# Patient Record
Sex: Female | Born: 1940 | Race: White | Hispanic: No | Marital: Married | Smoking: Never smoker
Health system: Southern US, Community
[De-identification: ages and names within clinical notes are randomized; demographics above are authoritative.]

## PROBLEM LIST (undated history)

## (undated) DIAGNOSIS — M0520 Rheumatoid vasculitis with rheumatoid arthritis of unspecified site: Secondary | ICD-10-CM

## (undated) DIAGNOSIS — E119 Type 2 diabetes mellitus without complications: Secondary | ICD-10-CM

## (undated) DIAGNOSIS — M1990 Unspecified osteoarthritis, unspecified site: Secondary | ICD-10-CM

## (undated) DIAGNOSIS — F32A Depression, unspecified: Secondary | ICD-10-CM

## (undated) DIAGNOSIS — M069 Rheumatoid arthritis, unspecified: Secondary | ICD-10-CM

## (undated) DIAGNOSIS — Z8489 Family history of other specified conditions: Secondary | ICD-10-CM

## (undated) DIAGNOSIS — F419 Anxiety disorder, unspecified: Secondary | ICD-10-CM

## (undated) DIAGNOSIS — Z9289 Personal history of other medical treatment: Secondary | ICD-10-CM

## (undated) DIAGNOSIS — R569 Unspecified convulsions: Secondary | ICD-10-CM

## (undated) DIAGNOSIS — E1110 Type 2 diabetes mellitus with ketoacidosis without coma: Secondary | ICD-10-CM

## (undated) DIAGNOSIS — I471 Supraventricular tachycardia, unspecified: Secondary | ICD-10-CM

## (undated) DIAGNOSIS — I10 Essential (primary) hypertension: Secondary | ICD-10-CM

## (undated) DIAGNOSIS — I313 Pericardial effusion (noninflammatory): Secondary | ICD-10-CM

## (undated) DIAGNOSIS — D649 Anemia, unspecified: Secondary | ICD-10-CM

## (undated) DIAGNOSIS — K625 Hemorrhage of anus and rectum: Secondary | ICD-10-CM

## (undated) DIAGNOSIS — R0902 Hypoxemia: Secondary | ICD-10-CM

## (undated) DIAGNOSIS — I219 Acute myocardial infarction, unspecified: Secondary | ICD-10-CM

## (undated) DIAGNOSIS — K5790 Diverticulosis of intestine, part unspecified, without perforation or abscess without bleeding: Secondary | ICD-10-CM

## (undated) DIAGNOSIS — Z794 Long term (current) use of insulin: Secondary | ICD-10-CM

## (undated) DIAGNOSIS — F329 Major depressive disorder, single episode, unspecified: Secondary | ICD-10-CM

## (undated) DIAGNOSIS — C801 Malignant (primary) neoplasm, unspecified: Secondary | ICD-10-CM

## (undated) DIAGNOSIS — I3139 Other pericardial effusion (noninflammatory): Secondary | ICD-10-CM

## (undated) DIAGNOSIS — K219 Gastro-esophageal reflux disease without esophagitis: Secondary | ICD-10-CM

## (undated) DIAGNOSIS — K222 Esophageal obstruction: Secondary | ICD-10-CM

## (undated) HISTORY — DX: Supraventricular tachycardia: I47.1

## (undated) HISTORY — DX: Unspecified osteoarthritis, unspecified site: M19.90

## (undated) HISTORY — DX: Type 2 diabetes mellitus without complications: Z79.4

## (undated) HISTORY — DX: Hypoxemia: R09.02

## (undated) HISTORY — PX: CHOLECYSTECTOMY: SHX55

## (undated) HISTORY — DX: Other pericardial effusion (noninflammatory): I31.39

## (undated) HISTORY — DX: Essential (primary) hypertension: I10

## (undated) HISTORY — DX: Anxiety disorder, unspecified: F41.9

## (undated) HISTORY — DX: Pericardial effusion (noninflammatory): I31.3

## (undated) HISTORY — DX: Supraventricular tachycardia, unspecified: I47.10

## (undated) HISTORY — DX: Gastro-esophageal reflux disease without esophagitis: K21.9

## (undated) HISTORY — DX: Anemia, unspecified: D64.9

## (undated) HISTORY — DX: Rheumatoid arthritis, unspecified: M06.9

## (undated) HISTORY — DX: Type 2 diabetes mellitus with ketoacidosis without coma: E11.10

## (undated) HISTORY — DX: Diverticulosis of intestine, part unspecified, without perforation or abscess without bleeding: K57.90

## (undated) HISTORY — DX: Depression, unspecified: F32.A

## (undated) HISTORY — DX: Esophageal obstruction: K22.2

## (undated) HISTORY — DX: Acute myocardial infarction, unspecified: I21.9

## (undated) HISTORY — DX: Major depressive disorder, single episode, unspecified: F32.9

## (undated) HISTORY — DX: Type 2 diabetes mellitus without complications: E11.9

---

## 1978-04-15 HISTORY — PX: ABDOMINAL HYSTERECTOMY: SHX81

## 1991-05-05 ENCOUNTER — Encounter: Payer: Self-pay | Admitting: Gastroenterology

## 1998-03-02 ENCOUNTER — Other Ambulatory Visit: Admission: RE | Admit: 1998-03-02 | Discharge: 1998-03-02 | Payer: Self-pay | Admitting: *Deleted

## 1999-06-02 ENCOUNTER — Emergency Department (HOSPITAL_COMMUNITY): Admission: EM | Admit: 1999-06-02 | Discharge: 1999-06-02 | Payer: Self-pay | Admitting: *Deleted

## 1999-07-15 ENCOUNTER — Emergency Department (HOSPITAL_COMMUNITY): Admission: EM | Admit: 1999-07-15 | Discharge: 1999-07-16 | Payer: Self-pay | Admitting: Internal Medicine

## 1999-07-16 ENCOUNTER — Encounter: Payer: Self-pay | Admitting: Emergency Medicine

## 2000-04-19 ENCOUNTER — Emergency Department (HOSPITAL_COMMUNITY): Admission: EM | Admit: 2000-04-19 | Discharge: 2000-04-19 | Payer: Self-pay | Admitting: Emergency Medicine

## 2000-06-21 ENCOUNTER — Emergency Department (HOSPITAL_COMMUNITY): Admission: EM | Admit: 2000-06-21 | Discharge: 2000-06-21 | Payer: Self-pay | Admitting: Emergency Medicine

## 2000-06-28 ENCOUNTER — Emergency Department (HOSPITAL_COMMUNITY): Admission: EM | Admit: 2000-06-28 | Discharge: 2000-06-28 | Payer: Self-pay | Admitting: Emergency Medicine

## 2000-09-21 ENCOUNTER — Encounter: Payer: Self-pay | Admitting: Emergency Medicine

## 2000-09-21 ENCOUNTER — Emergency Department (HOSPITAL_COMMUNITY): Admission: EM | Admit: 2000-09-21 | Discharge: 2000-09-21 | Payer: Self-pay | Admitting: Emergency Medicine

## 2001-09-24 ENCOUNTER — Encounter: Payer: Self-pay | Admitting: Emergency Medicine

## 2001-09-24 ENCOUNTER — Emergency Department (HOSPITAL_COMMUNITY): Admission: EM | Admit: 2001-09-24 | Discharge: 2001-09-24 | Payer: Self-pay | Admitting: Emergency Medicine

## 2002-03-23 ENCOUNTER — Encounter: Payer: Self-pay | Admitting: Emergency Medicine

## 2002-03-23 ENCOUNTER — Emergency Department (HOSPITAL_COMMUNITY): Admission: EM | Admit: 2002-03-23 | Discharge: 2002-03-23 | Payer: Self-pay | Admitting: Emergency Medicine

## 2002-12-06 ENCOUNTER — Emergency Department (HOSPITAL_COMMUNITY): Admission: EM | Admit: 2002-12-06 | Discharge: 2002-12-07 | Payer: Self-pay | Admitting: Emergency Medicine

## 2002-12-07 ENCOUNTER — Encounter: Payer: Self-pay | Admitting: Hematology and Oncology

## 2002-12-07 ENCOUNTER — Inpatient Hospital Stay (HOSPITAL_COMMUNITY): Admission: EM | Admit: 2002-12-07 | Discharge: 2002-12-08 | Payer: Self-pay | Admitting: Emergency Medicine

## 2002-12-15 DIAGNOSIS — F419 Anxiety disorder, unspecified: Secondary | ICD-10-CM

## 2002-12-15 HISTORY — DX: Anxiety disorder, unspecified: F41.9

## 2002-12-29 ENCOUNTER — Encounter: Payer: Self-pay | Admitting: Emergency Medicine

## 2002-12-29 ENCOUNTER — Inpatient Hospital Stay (HOSPITAL_COMMUNITY): Admission: EM | Admit: 2002-12-29 | Discharge: 2003-01-04 | Payer: Self-pay | Admitting: Emergency Medicine

## 2002-12-30 ENCOUNTER — Encounter: Payer: Self-pay | Admitting: Hematology and Oncology

## 2002-12-30 ENCOUNTER — Encounter: Payer: Self-pay | Admitting: Cardiology

## 2003-01-03 ENCOUNTER — Encounter: Payer: Self-pay | Admitting: *Deleted

## 2003-09-29 ENCOUNTER — Ambulatory Visit (HOSPITAL_COMMUNITY): Admission: RE | Admit: 2003-09-29 | Discharge: 2003-09-29 | Payer: Self-pay | Admitting: *Deleted

## 2003-09-29 ENCOUNTER — Encounter (INDEPENDENT_AMBULATORY_CARE_PROVIDER_SITE_OTHER): Payer: Self-pay | Admitting: *Deleted

## 2003-11-21 ENCOUNTER — Encounter: Admission: RE | Admit: 2003-11-21 | Discharge: 2003-11-21 | Payer: Self-pay | Admitting: Internal Medicine

## 2003-12-01 ENCOUNTER — Encounter: Admission: RE | Admit: 2003-12-01 | Discharge: 2003-12-01 | Payer: Self-pay | Admitting: Neurosurgery

## 2003-12-16 ENCOUNTER — Encounter: Admission: RE | Admit: 2003-12-16 | Discharge: 2003-12-16 | Payer: Self-pay | Admitting: Neurosurgery

## 2004-03-30 ENCOUNTER — Encounter: Admission: RE | Admit: 2004-03-30 | Discharge: 2004-03-30 | Payer: Self-pay | Admitting: *Deleted

## 2004-05-22 ENCOUNTER — Encounter: Admission: RE | Admit: 2004-05-22 | Discharge: 2004-05-22 | Payer: Self-pay | Admitting: *Deleted

## 2004-06-13 DIAGNOSIS — K222 Esophageal obstruction: Secondary | ICD-10-CM

## 2004-06-13 HISTORY — DX: Esophageal obstruction: K22.2

## 2004-06-18 ENCOUNTER — Ambulatory Visit (HOSPITAL_COMMUNITY): Admission: RE | Admit: 2004-06-18 | Discharge: 2004-06-18 | Payer: Self-pay | Admitting: *Deleted

## 2004-09-16 ENCOUNTER — Ambulatory Visit: Payer: Self-pay | Admitting: Cardiovascular Disease

## 2004-09-16 ENCOUNTER — Inpatient Hospital Stay (HOSPITAL_COMMUNITY): Admission: EM | Admit: 2004-09-16 | Discharge: 2004-09-18 | Payer: Self-pay | Admitting: Emergency Medicine

## 2005-10-12 ENCOUNTER — Encounter: Admission: RE | Admit: 2005-10-12 | Discharge: 2005-10-12 | Payer: Self-pay | Admitting: Internal Medicine

## 2006-11-12 ENCOUNTER — Ambulatory Visit: Payer: Self-pay | Admitting: Pulmonary Disease

## 2006-11-21 ENCOUNTER — Ambulatory Visit: Payer: Self-pay | Admitting: Pulmonary Disease

## 2006-12-02 ENCOUNTER — Ambulatory Visit: Payer: Self-pay | Admitting: Pulmonary Disease

## 2007-02-02 ENCOUNTER — Emergency Department (HOSPITAL_COMMUNITY): Admission: EM | Admit: 2007-02-02 | Discharge: 2007-02-03 | Payer: Self-pay | Admitting: Emergency Medicine

## 2007-02-11 ENCOUNTER — Ambulatory Visit: Payer: Self-pay | Admitting: Gastroenterology

## 2007-02-18 ENCOUNTER — Encounter: Payer: Self-pay | Admitting: Gastroenterology

## 2007-02-18 ENCOUNTER — Ambulatory Visit: Payer: Self-pay | Admitting: Gastroenterology

## 2007-06-08 DIAGNOSIS — J40 Bronchitis, not specified as acute or chronic: Secondary | ICD-10-CM | POA: Insufficient documentation

## 2007-06-08 DIAGNOSIS — C44309 Unspecified malignant neoplasm of skin of other parts of face: Secondary | ICD-10-CM | POA: Insufficient documentation

## 2007-06-08 DIAGNOSIS — K222 Esophageal obstruction: Secondary | ICD-10-CM

## 2007-06-08 DIAGNOSIS — C44300 Unspecified malignant neoplasm of skin of unspecified part of face: Secondary | ICD-10-CM | POA: Insufficient documentation

## 2007-06-08 DIAGNOSIS — J45909 Unspecified asthma, uncomplicated: Secondary | ICD-10-CM | POA: Insufficient documentation

## 2007-06-08 DIAGNOSIS — K648 Other hemorrhoids: Secondary | ICD-10-CM | POA: Insufficient documentation

## 2007-06-08 DIAGNOSIS — K2990 Gastroduodenitis, unspecified, without bleeding: Secondary | ICD-10-CM

## 2007-06-08 DIAGNOSIS — E876 Hypokalemia: Secondary | ICD-10-CM | POA: Insufficient documentation

## 2007-06-08 DIAGNOSIS — K589 Irritable bowel syndrome without diarrhea: Secondary | ICD-10-CM

## 2007-06-08 DIAGNOSIS — D126 Benign neoplasm of colon, unspecified: Secondary | ICD-10-CM

## 2007-06-08 DIAGNOSIS — K219 Gastro-esophageal reflux disease without esophagitis: Secondary | ICD-10-CM | POA: Insufficient documentation

## 2007-06-08 DIAGNOSIS — K5909 Other constipation: Secondary | ICD-10-CM

## 2007-06-08 DIAGNOSIS — R51 Headache: Secondary | ICD-10-CM

## 2007-06-08 DIAGNOSIS — J449 Chronic obstructive pulmonary disease, unspecified: Secondary | ICD-10-CM | POA: Insufficient documentation

## 2007-06-08 DIAGNOSIS — R519 Headache, unspecified: Secondary | ICD-10-CM | POA: Insufficient documentation

## 2007-06-08 DIAGNOSIS — I499 Cardiac arrhythmia, unspecified: Secondary | ICD-10-CM | POA: Insufficient documentation

## 2007-06-08 DIAGNOSIS — J309 Allergic rhinitis, unspecified: Secondary | ICD-10-CM | POA: Insufficient documentation

## 2007-06-08 DIAGNOSIS — M069 Rheumatoid arthritis, unspecified: Secondary | ICD-10-CM | POA: Insufficient documentation

## 2007-06-08 DIAGNOSIS — F411 Generalized anxiety disorder: Secondary | ICD-10-CM | POA: Insufficient documentation

## 2007-06-08 DIAGNOSIS — D649 Anemia, unspecified: Secondary | ICD-10-CM

## 2007-06-08 DIAGNOSIS — G4730 Sleep apnea, unspecified: Secondary | ICD-10-CM | POA: Insufficient documentation

## 2007-06-08 DIAGNOSIS — I309 Acute pericarditis, unspecified: Secondary | ICD-10-CM

## 2007-06-08 DIAGNOSIS — K2970 Gastritis, unspecified, without bleeding: Secondary | ICD-10-CM | POA: Insufficient documentation

## 2007-06-08 DIAGNOSIS — I10 Essential (primary) hypertension: Secondary | ICD-10-CM | POA: Insufficient documentation

## 2008-11-13 DIAGNOSIS — R569 Unspecified convulsions: Secondary | ICD-10-CM

## 2008-11-13 HISTORY — DX: Unspecified convulsions: R56.9

## 2008-12-04 ENCOUNTER — Inpatient Hospital Stay (HOSPITAL_COMMUNITY): Admission: EM | Admit: 2008-12-04 | Discharge: 2008-12-09 | Payer: Self-pay | Admitting: Emergency Medicine

## 2008-12-04 ENCOUNTER — Ambulatory Visit: Payer: Self-pay | Admitting: Cardiovascular Disease

## 2008-12-06 ENCOUNTER — Encounter (INDEPENDENT_AMBULATORY_CARE_PROVIDER_SITE_OTHER): Payer: Self-pay | Admitting: Internal Medicine

## 2009-04-03 ENCOUNTER — Encounter: Admission: RE | Admit: 2009-04-03 | Discharge: 2009-04-12 | Payer: Self-pay | Admitting: Internal Medicine

## 2010-07-21 LAB — HEPATIC FUNCTION PANEL
ALT: 19 U/L (ref 0–35)
AST: 21 U/L (ref 0–37)
Bilirubin, Direct: 0.1 mg/dL (ref 0.0–0.3)
Indirect Bilirubin: 0.9 mg/dL (ref 0.3–0.9)
Total Bilirubin: 1 mg/dL (ref 0.3–1.2)

## 2010-07-21 LAB — BASIC METABOLIC PANEL
BUN: 20 mg/dL (ref 6–23)
BUN: 4 mg/dL — ABNORMAL LOW (ref 6–23)
BUN: 5 mg/dL — ABNORMAL LOW (ref 6–23)
CO2: 28 mEq/L (ref 19–32)
CO2: 28 mEq/L (ref 19–32)
CO2: 30 mEq/L (ref 19–32)
Calcium: 8.3 mg/dL — ABNORMAL LOW (ref 8.4–10.5)
Chloride: 100 mEq/L (ref 96–112)
Chloride: 103 mEq/L (ref 96–112)
Chloride: 88 mEq/L — ABNORMAL LOW (ref 96–112)
Creatinine, Ser: 1 mg/dL (ref 0.4–1.2)
Creatinine, Ser: 1.03 mg/dL (ref 0.4–1.2)
Creatinine, Ser: 1.1 mg/dL (ref 0.4–1.2)
Creatinine, Ser: 1.56 mg/dL — ABNORMAL HIGH (ref 0.4–1.2)
GFR calc Af Amer: >60 mL/min (ref >60)
GFR calc Af Amer: >60 mL/min (ref >60)
GFR calc non Af Amer: 33 mL/min — ABNORMAL LOW (ref >60)
GFR calc non Af Amer: 50 mL/min — ABNORMAL LOW (ref >60)
GFR calc non Af Amer: 54 mL/min — ABNORMAL LOW (ref >60)
Glucose, Bld: 102 mg/dL — ABNORMAL HIGH (ref 70–99)
Glucose, Bld: 53 mg/dL — ABNORMAL LOW (ref 70–99)
Glucose, Bld: 55 mg/dL — ABNORMAL LOW (ref 70–99)
Glucose, Bld: 647 mg/dL (ref 70–99)
Glucose, Bld: 70 mg/dL (ref 70–99)
Potassium: 3.5 mEq/L (ref 3.5–5.1)
Potassium: 3.7 mEq/L (ref 3.5–5.1)
Potassium: 4 mEq/L (ref 3.5–5.1)
Sodium: 126 mEq/L — ABNORMAL LOW (ref 135–145)
Sodium: 130 mEq/L — ABNORMAL LOW (ref 135–145)

## 2010-07-21 LAB — URINALYSIS, ROUTINE W REFLEX MICROSCOPIC
Leukocytes, UA: NEGATIVE
Protein, ur: NEGATIVE mg/dL
Urobilinogen, UA: 0.2 mg/dL (ref 0.0–1.0)

## 2010-07-21 LAB — GLUCOSE, CAPILLARY
Glucose-Capillary: 106 mg/dL — ABNORMAL HIGH (ref 70–99)
Glucose-Capillary: 122 mg/dL — ABNORMAL HIGH (ref 70–99)
Glucose-Capillary: 134 mg/dL — ABNORMAL HIGH (ref 70–99)
Glucose-Capillary: 137 mg/dL — ABNORMAL HIGH (ref 70–99)
Glucose-Capillary: 155 mg/dL — ABNORMAL HIGH (ref 70–99)
Glucose-Capillary: 155 mg/dL — ABNORMAL HIGH (ref 70–99)
Glucose-Capillary: 164 mg/dL — ABNORMAL HIGH (ref 70–99)
Glucose-Capillary: 198 mg/dL — ABNORMAL HIGH (ref 70–99)
Glucose-Capillary: 272 mg/dL — ABNORMAL HIGH (ref 70–99)
Glucose-Capillary: 395 mg/dL — ABNORMAL HIGH (ref 70–99)
Glucose-Capillary: 529 mg/dL (ref 70–99)
Glucose-Capillary: 53 mg/dL — ABNORMAL LOW (ref 70–99)
Glucose-Capillary: 58 mg/dL — ABNORMAL LOW (ref 70–99)
Glucose-Capillary: 60 mg/dL — ABNORMAL LOW (ref 70–99)
Glucose-Capillary: 65 mg/dL — ABNORMAL LOW (ref 70–99)
Glucose-Capillary: 66 mg/dL — ABNORMAL LOW (ref 70–99)
Glucose-Capillary: 74 mg/dL (ref 70–99)
Glucose-Capillary: 80 mg/dL (ref 70–99)
Glucose-Capillary: 90 mg/dL (ref 70–99)
Glucose-Capillary: 92 mg/dL (ref 70–99)
Glucose-Capillary: >600 mg/dL (ref 70–99)

## 2010-07-21 LAB — DIFFERENTIAL
Basophils Absolute: 0 10*3/uL (ref 0.0–0.1)
Basophils Absolute: 0.1 10*3/uL (ref 0.0–0.1)
Basophils Relative: 0 % (ref 0–1)
Basophils Relative: 1 % (ref 0–1)
Eosinophils Relative: 0 % (ref 0–5)
Eosinophils Relative: 1 % (ref 0–5)
Lymphocytes Relative: 6 % — ABNORMAL LOW (ref 12–46)
Lymphs Abs: 0.4 10*3/uL — ABNORMAL LOW (ref 0.7–4.0)
Monocytes Absolute: 0.7 10*3/uL (ref 0.1–1.0)
Monocytes Absolute: 1 10*3/uL (ref 0.1–1.0)
Monocytes Relative: 10 % (ref 3–12)
Monocytes Relative: 7 % (ref 3–12)

## 2010-07-21 LAB — CARDIAC PANEL(CRET KIN+CKTOT+MB+TROPI)
CK, MB: 5.2 ng/mL — ABNORMAL HIGH (ref 0.3–4.0)
CK, MB: 5.3 ng/mL — ABNORMAL HIGH (ref 0.3–4.0)
Relative Index: 3.8 — ABNORMAL HIGH (ref 0.0–2.5)
Total CK: 125 U/L (ref 7–177)
Total CK: 137 U/L (ref 7–177)
Troponin I: 0.08 ng/mL — ABNORMAL HIGH (ref 0.00–0.06)

## 2010-07-21 LAB — COMPREHENSIVE METABOLIC PANEL
Albumin: 2.9 g/dL — ABNORMAL LOW (ref 3.5–5.2)
BUN: 12 mg/dL (ref 6–23)
Calcium: 8.1 mg/dL — ABNORMAL LOW (ref 8.4–10.5)
Chloride: 103 mEq/L (ref 96–112)
Creatinine, Ser: 1.1 mg/dL (ref 0.4–1.2)
Total Bilirubin: 0.9 mg/dL (ref 0.3–1.2)

## 2010-07-21 LAB — POCT CARDIAC MARKERS
CKMB, poc: 1.3 ng/mL (ref 1.0–8.0)
Myoglobin, poc: 198 ng/mL (ref 12–200)
Troponin i, poc: <0.05 ng/mL (ref 0.00–0.09)

## 2010-07-21 LAB — CBC
HCT: 28.4 % — ABNORMAL LOW (ref 36.0–46.0)
HCT: 28.9 % — ABNORMAL LOW (ref 36.0–46.0)
HCT: 30.8 % — ABNORMAL LOW (ref 36.0–46.0)
HCT: 31.2 % — ABNORMAL LOW (ref 36.0–46.0)
Hemoglobin: 10.8 g/dL — ABNORMAL LOW (ref 12.0–15.0)
Hemoglobin: 12.4 g/dL (ref 12.0–15.0)
Hemoglobin: 9.9 g/dL — ABNORMAL LOW (ref 12.0–15.0)
MCHC: 34.3 g/dL (ref 30.0–36.0)
MCHC: 34.6 g/dL (ref 30.0–36.0)
MCHC: 34.7 g/dL (ref 30.0–36.0)
MCV: 91.6 fL (ref 78.0–100.0)
MCV: 92.9 fL (ref 78.0–100.0)
Platelets: 237 10*3/uL (ref 150–400)
Platelets: 254 10*3/uL (ref 150–400)
RBC: 3.12 MIL/uL — ABNORMAL LOW (ref 3.87–5.11)
RBC: 3.39 MIL/uL — ABNORMAL LOW (ref 3.87–5.11)
RBC: 3.88 MIL/uL (ref 3.87–5.11)
RDW: 13.7 % (ref 11.5–15.5)
RDW: 13.7 % (ref 11.5–15.5)
RDW: 13.9 % (ref 11.5–15.5)
RDW: 14.1 % (ref 11.5–15.5)
WBC: 10.3 10*3/uL (ref 4.0–10.5)
WBC: 10.5 10*3/uL (ref 4.0–10.5)
WBC: 11.3 10*3/uL — ABNORMAL HIGH (ref 4.0–10.5)

## 2010-07-21 LAB — HEMOGLOBIN A1C: Hgb A1c MFr Bld: 8.5 % — ABNORMAL HIGH (ref 4.6–6.1)

## 2010-07-21 LAB — LIPID PANEL
Cholesterol: 299 mg/dL — ABNORMAL HIGH (ref 0–200)
HDL: 53 mg/dL (ref >39)
LDL Cholesterol: UNDETERMINED mg/dL (ref 0–99)
Total CHOL/HDL Ratio: 5.6 RATIO

## 2010-07-21 LAB — RETICULOCYTES
RBC.: 3.74 MIL/uL — ABNORMAL LOW (ref 3.87–5.11)
Retic Count, Absolute: 59.8 10*3/uL (ref 19.0–186.0)

## 2010-07-21 LAB — VITAMIN B12: Vitamin B-12: 502 pg/mL (ref 211–911)

## 2010-07-21 LAB — IRON AND TIBC: Iron: 49 ug/dL (ref 42–135)

## 2010-07-21 LAB — URINE CULTURE: Colony Count: 80000

## 2010-07-21 LAB — CK TOTAL AND CKMB (NOT AT ARMC): Total CK: 127 U/L (ref 7–177)

## 2010-07-21 LAB — URINE MICROSCOPIC-ADD ON

## 2010-07-21 LAB — MAGNESIUM: Magnesium: 1.6 mg/dL (ref 1.5–2.5)

## 2010-07-21 LAB — FERRITIN: Ferritin: 66 ng/mL (ref 10–291)

## 2010-07-21 LAB — T4, FREE: Free T4: 1.05 ng/dL (ref 0.80–1.80)

## 2010-08-28 NOTE — Consult Note (Signed)
NAMEMARDA, [name]              ACCOUNT NO.:  [id number]   MEDICAL RECORD NO.:  [id number]          PATIENT TYPE:  INP   LOCATION:  1224                         FACILITY:  [facility]   PHYSICIAN:  Levert Feinstein, MD          DATE OF BIRTH:  [birth year]   DATE OF CONSULTATION:  12/06/2008  DATE OF DISCHARGE:                                 CONSULTATION   REFERRING PHYSICIAN:  [facility], Dr. Sherrie Mustache, for seizure.   HISTORY OF PRESENT ILLNESS:  The patient is a 70 year old right-handed  Caucasian female who was admitted to hospital service on August 22, 2  days prior to today's event, for newly diagnosed diabetes.  Husband is  at the bedside who provided majority of the history.  Also from  reviewing the chart.   She has past medical history of rheumatoid arthritis, hypertension, and  has been taking low-dose long-term prednisone and also Arava.  The past  3 months has been started on Lyrica for generalized body pain.  In the  past few weeks, she has been complaining of blurred vision, and  polyuria.  Eventually visited her ophthalmologist to get her eyes  checked out.  Was found to have glucose level of 650, blood pressure was  230/110.  She was sent to the emergency room leading to this admission.  Today, she complains of generalized fatigue, not feeling well.  Around  3:00, she received echocardiogram.  During the procedure, she complained  of chest pain.  Later complained of pain all over.  Was also noticed to  have blood pressure 220/110 with slight tachycardia, heart rate of 110.  At 3:30, she was noticed to have seizure activity, eyes closed, body  leaned backwards, stiff, tonic-clonic movement, lasted about 1 to 2  minutes.  When it was over, she was postictal, opened eyes to verbal and  painful stimuli, but nonverbal, sluggish.  Later, she vomited.  She  moved her 4 extremities without difficulty and with conjugate eye  movements.   REVIEW OF SYSTEMS:  As above.   PAST  MEDICAL HISTORY:  Asthma, hypertension, acid reflux, anxiety,  hiatal hernia, irritable bowel syndrome, rheumatoid arthritis,  hypertension, newly diagnosed diabetes.   PAST SURGICAL HISTORY:  Hysterectomy in 1981.  Status post  cholecystectomy in 1991.  Endoscopy and colonoscopy in June 2005.  Cardiac catheterization June 2006.  Was reported clean catheterization.   SOCIAL HISTORY:  Lives with her husband.  Denies smoking or drinking.   FAMILY HISTORY:  Significant for mother had diabetes, coronary artery  disease in her father.   CURRENT MEDICATIONS:  1. Vitamin C.  2. Calcium.  3. Estrogen.  4. Lovenox.  5. Flovent.  6. Amaryl.  7. NovoLog.  8. Xopenex.  9. Lisinopril.  10.Lorazepam.  11.Magnesium.  12.Metformin.  13.Protonix.  14.Prednisone 10 mg daily.  15.Simethicone.  16.Tylenol.  17.Lopressor.  18.Zofran.   PHYSICAL EXAMINATION:  VITAL SIGNS:  She is afebrile.  Blood pressure  right now is 168/90 and heart rate is 104.  CARDIAC:  Tachycardic and regular.  NEUROLOGIC:  She opens eyes to painful  and verbal stimulation, conjugate  eye movements, but nonverbal.  Not following commands.  Had some  spontaneous movements in all 4 limbs.  Fairly symmetric.  Cranial nerves  2-12 are intact, pupils dilated 5 to 3 mm to light stimulation, equal  conjugate eye movements.  Face is symmetric.  Motor examination,  withdrawal to pain on 4 extremities.  Deep tendon reflexes hypoactive  and symmetric.  NECK:  Supple.  No bruits.   LABORATORY EVALUATION:  Sodium 130, creatinine 1.0, magnesium low.  She  was receiving magnesium sulfate supplement when the accident happened.  Glucose was reported to be 150s.  Hemoglobin 10.8.  Chronic anemia.  TSH  was normal.  A CT of the brain without contrast August 21 and today's  review.  There was fairly extensive periventricular white matter disease  but no acute lesions.   ASSESSMENT AND PLAN:  A 70 year old female with a history of   hypertension poorly controlled, newly diagnosed diabetes, had 1 seizure  generalized tonic-clonic in the setting of electrolyte imbalance,  elevated blood pressure (220/110), differential diagnosis including PRES  syndrome versus stroke.  Plan:  EEG.  MRI of the brain with and without  contrast.  Treat her blood pressure, hyperglycemia, and hold off  antiepileptic medication now.      Levert Feinstein, MD  Electronically Signed     YY/MEDQ  D:  12/06/2008  T:  12/06/2008  Job:  [id number]

## 2010-08-28 NOTE — Procedures (Signed)
EEG NUMBER:  [id number].   REQUESTING PHYSICIAN:  Elliot Cousin, MD   CLINICAL HISTORY:  A 70 year old woman admitted August 22, who reported  in the hospital with seizure of August 24 in the setting of uncontrolled  diabetes.  EEG is for evaluation.  The patient describes awake and  alert.  This is an portable EEG done without photic stimulation or  hyperventilation.   DESCRIPTION:  The dominant rhythm tracing is a moderate to high  amplitude alpha rhythm of 9-10 Hz which predominates posteriorly,  appears without abnormal asymmetry, and attenuates with eye opening and  closing.  Low amplitude fast activity is seen frontally and centrally  and appears without abnormal asymmetry.  No focal slowing is noted and  no epileptiform discharges were seen.  The patient remained in the awake  state throughout the recording.  Photic stimulation and hyperventilation  not performed.  Single channel devoted to EKG revealed sinus tachycardia  throughout with rate of approximately 114 beats per minute.   CONCLUSIONS:  Normal study in the awake state.  Incidental note is made  of sinus tachycardia.      Michael L. Thad Ranger, M.D.  Electronically Signed     EAV:WUJW  D:  12/08/2008 21:35:44  T:  12/09/2008 05:38:49  Job #:  [id number]

## 2010-08-28 NOTE — Discharge Summary (Signed)
Marisa [name], Marisa [name]              ACCOUNT NO.:  [id number]   MEDICAL RECORD NO.:  [id number]           PATIENT TYPE:  INP   LOCATION:                               FACILITY:  [facility]   PHYSICIAN:  Beckey Rutter, MD  DATE OF BIRTH:  [birth year]   DATE OF ADMISSION:  12/04/2008  DATE OF DISCHARGE:  12/09/2008                               DISCHARGE SUMMARY   PRIMARY CARE PHYSICIAN:  Dr. Juline Patch.   CHIEF COMPLAINT:  Newly discovered diabetes.   BRIEF HISTORY OF PRESENT ILLNESS:  This is a 70 year old very pleasant  Caucasian lady with history of rheumatoid arthritis, hypertension who  was on low dose prednisone chronically.  The patient was admitted for  newly diagnosed type 2 diabetes and hypertensive urgency.   HOSPITAL PROCEDURES:  1. Chest x-ray on December 03, 2008 impression:  No acute finding.  2. CT head without contrast on December 03, 2008 impression:  No      evidence of acute infarction, mass lesion or hemorrhage on the CT.      Mild periventricular and subcortical white matter change suggests      small vessel ischemic microangiopathy.  3. Chest x-ray on December 06, 2008 impression:  No acute disease.  4. CT head without contrast on December 06, 2008 impression:  Chronic      microvascular ischemia in the white matter.  No acute abnormality.  5. MRI brain with and without contrast December 07, 2008.  The patient      has no acute intracranial abnormality.  6. Mild to moderate for age nonspecific cerebral white matter signal      change.  Favor chronic small vessel ischemia.  7. Ultrasound abdomen December 07, 2008 impression:  Cholecystectomy.      No biliary ductal dilatation.  Poor visualization of pancreatic      tail due to bowel gas.  Minimal left renal cyst appears to be      present.  Calcified splenic granulomata.   Her magnesium today is 1.7.  Sodium 139, potassium 4.1, chloride 103,  bicarb 30, glucose 102, BUN 5, creatinine is 1.0.   HOSPITAL COURSE BY  PROBLEM:  1. New diagnosis of diabetes type 2.  The patient will be discharged      with medication as below, mainly the Amaryl and the Lantus.  The      patient was seen by the registered dietician, and she watched all      the diabetes educational videos.  The patient advised to follow up      with Dr. Ricki Miller for further diabetes management adjustment.  2. Tonic-clonic  fits/seizure.  The patient was noticed to have 1      generalized tonic-clonic in the setting of electrolyte imbalance      and elevated blood pressure to 120/110 . The patient was seen by      neurologist for consultation, and the differential diagnosis at      that time of consultation was PRAS syndrome versus stroke.  The      patient had MRI done which is essentially  negative, and EEG is      showing no epileptiform waves.  The patient will not receive anti-      seizure medication at this time.  3. Acute renal failure resolved with hydration.  4. Hypertension.  The patient will be discharged on lisinopril and      metoprolol.   DISCHARGE DIAGNOSES:  1. Newly diagnosed type 2 diabetes.  2. One episode of tonic-clonic seizure.  3. Bronchial asthma.  4. Hypertension.  5. Gastroesophageal reflux disease.  6. Anxiety.  7. Hiatal hernia.  8. Irritable bowel syndrome.  9. Fibromyalgia.  10.Rheumatoid arthritis.   Please notice: During the hospital stay the patient was receiving  different doses of Ativan other than her usual/home dose which could  potentially induce the tonic-clonic fits.   DISCHARGEMEDICATION  1. Potassium chloride 20 mEq daily.  2. Prednisone 5 mg daily.  3. Arava 20 mg daily.  4. Premarin 0.6 mg daily.  5. Nexium 40 mg twice a day.  6. Toprol XL 100 mg daily.  7. Lorazepam 2 mg at bedtime.  8. Boniva 150 mg monthly.  9. 11.  Vitamin E 4000 international units daily.  10.Vitamin C 500 mg daily.  11.Iron 325 mg daily.  12.Multivitamin with iron daily.  13.Folic acid 1 mg daily.   14.Os-Cal with vitamin D 2 tablets daily.  15.Vitamin D 1.25 mg daily.  16.Lisinopril 10 mg daily.  17.Ativan 1 mg p.o. daily and 2 mg q.h.s.  18.Amaryl 2 mg p.o. daily.  19.Metformin 500 mg p.o. b.i.d..  20.Prescription for glucometer, alcohol swabs, lancets, and strips as      written.   DISCHARGE PLAN:  The patient will be discharged today to follow up with  Dr. Ricki Miller within next week.  The patient is aware and agreeable to the  discharge and follow-up plans.      Beckey Rutter, MD  Electronically Signed    EME/MEDQ  D:  12/09/2008  T:  12/09/2008  Job:  [id number]   cc:   Juline Patch, M.D.  Fax: [phone]

## 2010-08-28 NOTE — H&P (Signed)
Marisa [name], Marisa [name] NO.:  [id number]   MEDICAL RECORD NO.:  [id number]          PATIENT TYPE:  INP   LOCATION:  1422                         FACILITY:  [facility]   PHYSICIAN:  Vania Rea, M.D. DATE OF BIRTH:  May 23, 1940   DATE OF ADMISSION:  12/03/2008  DATE OF DISCHARGE:                              HISTORY & PHYSICAL   PRIMARY CARE PHYSICIAN:  Dr. Juline Patch.   CHIEF COMPLAINT:  Newly-discovered diabetes.   HISTORY OF PRESENT ILLNESS:  This is a 70 year old Caucasian lady, with  a history of rheumatoid arthritis and hypertension, who takes  chronically low-dose prednisone and also Arava, and has been on these  for many years.  For the past three months, has been started on Lyrica  with generalized body pains, presumed fibromyalgia, but for the past few  weeks has been having blurring of vision, frequency and polyuria, and  eventually visited her ophthalmologist to get her eyes checked out.  She  had her blood sugar checked, and it was 650, and her blood pressure was  also elevated to 230/110, and she was sent to the emergency room for  evaluation.   In the emergency room, after preliminary investigations and treatment,  the hospitalists' service was called to admit this lady for new onset of  diabetes.   The patient denies any chest pains or shortness of breath, other than  occasioned by her asthma.  She denies any fever or cough.  She has been  having itching under her breasts and she has been having generalized  body aches.  She has been having no increasing weight loss.  Her mother  did have diabetes and died of complications related to diabetes.   PAST MEDICAL HISTORY:  1. Asthma.  2. Hypertension.  3. Gastroesophageal reflux disease.  4. Anxiety.  5. A hiatal hernia.  6. Irritable bowel syndrome.  7. Possible fibromyalgia.  8. rheumatoid arthritis.   MEDICATIONS:  1. includes potassium chloride 20 mEq twice daily.  2. Prednisone 5 mg,  two  tablets daily.  3. Arava 20 mg daily.  4. Premarin 0.6 mg daily.  5. Nexium 40 mg twice daily.  6. Toprol XL 100 mg daily.  7. Lorazepam 1 mg at bedtime.  8. Boniva 150 mg monthly.  9. Tandem plus, one capsule daily.  10.Xopenex by inhaler and nebulized, one to four puffs daily.  11.The patient has been out of Lyrica for about two weeks.  12.Vitamin E 4000 international units daily.  13.Vitamin C 500 mg daily.  14.Iron 65 mg daily.  15.Multivitamins with iron daily.  16.Folic acid 1 mg daily.  17.Os-Cal with D, two tablets daily.  18.Vitamin D, 1.25 mg daily.   ALLERGIES:  PENICILLIN AND DEMEROL.   PAST SURGICAL HISTORY:  1. Includes a hysterectomy in 1981.  2. Status post cholecystectomy in 1991.  3. Endoscopy and colonoscopy in June 2005.  4. Cardiac catheterization in June, 2006, and was reported apparently      as a clean catheterization.   SOCIAL HISTORY:  She denies tobacco, alcohol or illicit drug use.  She  is married  and lives at home with her spouse.   FAMILY HISTORY:  Significant for diabetes in her mother, coronary artery  disease in her father.   REVIEW OF SYSTEMS:  On a 10-point review of systems, other than as noted  above, significant only for impaired mobility.  She walks with a rolling  walker chair.   PHYSICAL EXAMINATION:  GENERAL:  A very pleasant middle-aged Caucasian  lady lying flat on the stretcher, in no acute distress.  VITAL SIGNS:  Her temperature is 98.7, her pulse is 126, her blood  pressure after intravenous labetalol is 162/91, respiratory rate is 18.  She is saturating at 97% on 2 liters.  HEENT:  The pupils are round, equal and reactive.  Mucous membranes are  pink.  She is mildly dehydrated.  NECK:  No cervical lymphadenopathy or thyromegaly.  No jugular venous  distention.  CHEST:  Clear to auscultation bilaterally.  CARDIOVASCULAR:  She is tachycardiac.  ABDOMEN:  Her abdomen is obese, soft, and no masses felt.  EXTREMITIES:   She has a trace edema bilaterally.  Dorsalis pedis pulses  2+ bilaterally.  MUSCULOSKELETAL:  She has arthritic joint deformities of both hands and  deformities of knees and crepitus of the knees and elbows.  She has  valgus deformity of the knees.  SKIN:  Her skin is warm and dry.  There is no ulceration.  She has no  intertriginous Candida of the pannus, but she has a very fine miliary  rash under the left breast.  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  She has what appears to be a  hemorrhagic rash on both arms, but she says these are birth marks.  CENTRAL NERVOUS SYSTEM:  Cranial nerves II-XII are grossly intact, and  she has no focal lateralizing signs.   LABORATORY DATA:  White count is 10.3, hemoglobin 12.4, MCV 91.8,  platelets 295.  She has 89% neutrophils, absolute leukocyte count  elevated at 9.3.  Her sodium is 126, potassium 4.0, chloride 88, CO2 of  25, glucose 647, BUN 20, creatinine 1.56, calcium 8.9.  Her cardiac  enzymes:  Myoglobin equals 198, CK-MB 1.3, troponins undetectable.   A chest x-ray one-view shows no acute findings, mild hyperinflation of  the lungs.  No acute bony abnormality.   ASSESSMENT:  1. Diabetes, type 2, newly-diagnosed.  2. Hypertensive urgency, in a lady with a history of hypertension.  3. Dehydration and acute renal failure, in the setting of uncontrolled      diabetes.  4. Pseudo-hyponatremia, in the setting of severe hyperglycemia.  5. Leukocytosis, in the setting of a severe dehydration  6. Rheumatoid arthritis.  7. Chronic pains.  8. History of gastroesophageal reflux disease.  9. History of irritable bowel syndrome.   PLAN:  Will admit this lady to the telemetry unit and continue  Glucommander, and then start her on scheduled insulin which she will  probably need for a few months at least, although the diabetes may have  been precipitated by the prednisone and Arava, and possibly also Lyrica.  We  will continue her prednisone and Arava for the time being for  rheumatoid arthritis.  In fact, will continue her home medications, and  once she is hydrated and her renal function has improved, will treat her  diabetes with an ACE inhibitor or ARB and temporarily hold her  potassium.  She will get diabetic education, other plans will be as per  orders.  Vania Rea, M.D.  Electronically Signed     LC/MEDQ  D:  12/04/2008  T:  12/04/2008  Job:  [id number]   cc:   Juline Patch, M.D.  Fax: [phone]   Gwyneth Sprout, MD

## 2010-08-28 NOTE — Assessment & Plan Note (Signed)
[facility]                         GASTROENTEROLOGY OFFICE NOTE   Marisa [name], Marisa [name]                     MRN:          [id number]  DATE:02/11/2007                            DOB:          03-May-1940    REFERRING PHYSICIAN:  Juline Patch, M.D.   REASON FOR CONSULTATION:  Right upper quadrant pain, constipation and  dysphagia.   HISTORY OF PRESENT ILLNESS:  Marisa [name] is a 70 year old white  female that I saw in the past. She underwent cholecystectomy in February  of 1991. She returned with abdominal pain, nausea, vomiting and GERD in  January of 1993. Upper endoscopy revealed mild antral gastritis and a  small hiatal hernia. An ERCP showed a normal post cholecystectomy  cholangiogram and a normal ampulla. She has had ongoing problems with  GERD and constipation over the years. She changed gastroenterologists to  Dr. Virginia Rochester and underwent an upper endoscopy with dilatation in March of  2006 for dysphagia. A distal esophageal stricture was noted. Colonoscopy  was performed in June of 2005, which showed a single adenomatous colon  polyp and internal hemorrhoids. She has been managed on Amitiza for  constipation. Recently, her Xanax was discontinued and she began Ambien.  She has also recently started Singular. She has noted worsening problems  with epigastric pain radiating to the right upper quadrant and right  flank as well as the right lower chest for about the past week. The pain  does not appear to change with meals or bowel movements. She has noted  worsening problems with gas and bloating since she has discontinued  Amitiza. She states that Amitiza was leading to loose stools. She has  noted small amounts of bright red blood per rectum, which has happened  intermittently and she attributes this to her hemorrhoids. She has  ongoing difficulty with solid food and pill dysphagia. She notes no  weight loss, change in stool caliber, nausea,  vomiting, odynophagia or  weight loss.   She recently underwent a CT scan of the abdomen and pelvis at  [facility] that was read as normal. She was seen in the  emergency room recently with chest and abdominal films revealing  osteoporosis with degenerative changes and scoliosis of the spine. There  is also bilateral healing pelvic fractures. A recent CMET, CBC and  lipase were unremarkable.   PAST MEDICAL HISTORY:  1. Asthma.  2. Chronic obstructive pulmonary disease.  3. Hypertension.  4. Gastroesophageal reflux disease.  5. Esophageal strictures.  6. Adenomatous colon polyps.  7. Internal hemorrhoids.  8. Anxiety.  9. Irritable bowel syndrome.  10.Rheumatoid arthritis.  11.Status post appendectomy.  12.Status post cholecystectomy.  13.Status post hysterectomy.  14.Status post cardiac catheterization.   CURRENT MEDICATIONS:  Listed on the chart, updated and reviewed.   MEDICATION ALLERGIES:  PENICILLIN AND DEMEROL.   SOCIAL HISTORY:  Per the handwritten form.   REVIEW OF SYSTEMS:  Per the handwritten form.   PHYSICAL EXAMINATION:  Well-developed, well-nourished, overweight white  female in no acute distress. Height 4 feet, 11 inches. Weight 169.4  pounds, blood pressure 180/80, pulse 120 and regular.  HEENT: Anicteric sclerae.  NECK: Without thyromegaly or adenopathy.  CHEST: Clear to auscultation bilaterally. She has right lower anterior  and right lateral chest wall tenderness.  CARDIAC: Tachycardia without murmurs, regular.  ABDOMEN: Soft with epigastric, right upper quadrant and right flank  tenderness, all below the right costal margin. No rebound or guarding.  No palpable organomegaly, masses or hernias. Normoactive bowel sounds.  NEUROLOGIC: Alert and oriented x3. Grossly nonfocal.   ASSESSMENT/PLAN:  1. Right upper quadrant, right lower chest and epigastric pain. Rule      out ulcer disease, gastroesophageal reflux disease and       musculoskeletal pain. In addition, she has longterm dysphagia, rule      out a recurrent peptic stricture. Risks, benefits and alternatives      to upper endoscopy with possible biopsy and possible dilation      discussed with the patient and she consents to proceed. This will      be scheduled electively.  2. Chronic constipation and small volume hematochezia. Begin MiraLax      one twice a day and resume Amitiza daily. If her bowel movements      are regular on MiraLax alone, she may discontinue Amitiza. Over-the-      counter Anusol suppositories for hemorrhoidal symptoms. If her      rectal bleeding and constipation persists, will consider a repeat      colonoscopy.  3. Personal history of adenomatous colon polyps. Recall colonoscopy      recommended for June 2010.     Venita Lick. Russella Dar, MD, [facility]  Electronically Signed    MTS/MedQ  DD: 02/11/2007  DT: 02/11/2007  Job #: [id number]   cc:   Juline Patch, M.D.

## 2010-08-28 NOTE — Assessment & Plan Note (Signed)
[facility]                             PULMONARY OFFICE NOTE   NAME:Marisa [name], Marisa [name]                     MRN:          [id number]  DATE:11/12/2006                            DOB:          19-Aug-1940    HISTORY OF PRESENT ILLNESS:  The patient is a 71 year old white female  who I have been asked to see by Dr. Ricki Miller for an abnormal chest CT.  The  patient carries the diagnosis of rheumatoid arthritis that she has had  for 15 years and is currently on prednisone for this.  The patient has  had a recent CT scan that according to the radiology report shows  scattered ground glass densities that have not changed from prior CT.  I  have reviewed her current CT and I must admit that I do not see these  ground glass infiltrates, however the films are definitely abnormally  over penetrated which may hide the infiltrates in question.  Patient  does have pleural thickening as well as some splenic calcifications.  There are no obvious infiltrates.  Patient states that she has had  dyspnea on exertion for years and has not been progressive in nature.  She has no significant cough and no significant mucus production.  She  has occasional wheezing and has been placed on nebulizer treatments with  two medicines that she does not know for this, for presumed asthma.  The  patient states that her weight has increased about 30 pounds over the  last 2 years and that she does have ongoing chronic lower extremity  edema.  It should also be noted the patient has lived quite a bit of  time in [place] in the past.   PAST MEDICAL HISTORY:  1. Significant for hypertension.  2. History of chronic headaches.  3. Status post cholecystectomy.  4. Status post hysterectomy.  5. Allergic rhinitis.  6. History of rheumatoid arthritis as stated above.   CURRENT MEDICATIONS:  1. Klor-Con 20 mEq b.i.d.  2. Prednisone 10 mg daily.  3. Premarin 0.9 mg daily.  4. Nexium 40 mg daily.  5. Arava 20 mg daily.  6. Amitiza 24 mcg one b.i.d.  7. Toprol 50 mg daily.  8. Xanax 0.5 mg b.i.d.  9. Boniva 150 q. month.  10.Singulair 10 mg daily.  11.Lorazepam 1 mg b.i.d. p.r.n.  12.Nebulizers of unknown medication a.m. and p.m. and p.r.n.   ALLERGIES:  Patient states that she is ASPIRIN INTOLERANT but has No  known drug allergies.   SOCIAL HISTORY:  She has never smoked. She is married and has children.  She lives with her husband.   FAMILY HISTORY:  Remarkable for father having heart disease, otherwise  is noncontributory in first degree relatives.   REVIEW OF SYSTEMS:  As per history of present illness.  Also see patient  intake documented on chart.   PHYSICAL EXAMINATION:  GENERAL:  She is an obese white female in no  acute distress.  VITAL SIGNS:  Blood pressure is 174/86, pulse 90, temperature 98. Weight  is 183 pounds. Oxygen saturation on room air is 96%.  HEENT:  Pupils are equal, round and reactive to light and accommodation.  Extraocular muscles are intact. Nares are patent without discharge.  Oropharynx does show moderate elongation of the soft palate and uvula.  NECK:  Supple without JVD or lymphadenopathy, there is no palpable  thyromegaly.  CHEST:  Fairly clear except for one isolated inspiratory pop in the  right mid lung field.  CARDIAC EXAM:  Reveals regular rate and rhythm.  ABDOMEN:  Soft, nontender with good bowel sounds.  GU/RECTAL/BREAST EXAM:  Not done and not indicated.  LOWER EXTREMITIES:  Do show 1+ edema, pulses are intact distally but  decreased.  NEUROLOGIC:  She is alert and oriented without obvious durable motor  defects.   IMPRESSION:  1. Questionable ground glass densities on CT scan which I am unable to      appreciate on the printed films.  I suspect the films have been      over penetrated and therefore have obscured the questionable      infiltrates.  She does have some pleural thickening and splenic      calcifications. I will  need to get digital images on a disk to see      if she indeed has this.  I had a long discussion with her about how      rheumatoid arthritis may affect the lungs, including pleural      thickening and effusions, pulmonary fibrosis as well as pulmonary      inflammatory changes.  More than likely if she does have some      ground glass densities it is transitory inflammation that sometimes      represents BOOP in patients with RA.  I suspect the majority of her      shortness of breath is due to her morbid obesity, deconditioning      and debility.  2. Questionable obstructive sleep apnea.  The patient gives a very      good history for this but has never had any sleep study.  She does      use nocturnal oxygen.  I would consider doing a sleep study and      will leave that to her primary care physician.  3. Questionable history of asthma.  She has not had pulmonary function      tests by her history and I think these do need to be done off of      bronchodilators to verify whether she has air flow obstruction or      not.  4. 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  The      patient did live in the [place] for a period of time.   PLAN:  1. Will get a disk from [facility].  2. I have asked the patient to work on weight loss and exercise.  3. Will do full PFTs off the nebulizers.  4. Overnight oximetry off of oxygen to see whether or not she does      indeed need the O2. I      would consider an NPSG but will leave that to her primary care      physician.  5. The patient will followup after the above.     Barbaraann Share, MD,FCCP  Electronically Signed    [facility]/MedQ  DD: 11/12/2006  DT: 11/12/2006  Job #: [id number]

## 2010-08-31 NOTE — Cardiovascular Report (Signed)
NAMEGUILIANA, SHOR              ACCOUNT NO.:  [id number]   MEDICAL RECORD NO.:  [id number]          PATIENT TYPE:  INP   LOCATION:  4707                         FACILITY:  [facility]   PHYSICIAN:  Salvadore Farber, M.D. LHCDATE OF BIRTH:  February 21, 1941   DATE OF PROCEDURE:  09/17/2004  DATE OF DISCHARGE:                              CARDIAC CATHETERIZATION   PROCEDURE:  Left heart catheterization, left ventriculography, coronary  angiography, abdominal aortography.   INDICATIONS:  Ms. Lemaster is a 70 year old lady with rheumatoid arthritis  and history of pericarditis, treated with steroids 18 months ago, who now  presents with having been awoken with right-sided chest pain accompanied by  shortness of breath and diaphoresis and nausea.  She was admitted to the  hospital and ruled out for myocardial infarction by serial enzymes.  Electrocardiogram demonstrated new inferior and apical T-wave inversions.  She was therefore referred for diagnostic angiography.   PROCEDURAL TECHNIQUE:  Informed consent was obtained.  Under 1% lidocaine  local anesthesia, a 5 French sheath was placed in the right common femoral  artery using modified Seldinger technique.  Diagnostic angiography and  ventriculography were performed using JL4, JR4 and pigtail catheters.  The  patient tolerated the procedure well and was transferred to the holding area  in stable condition.  Sheaths will be removed there.   COMPLICATIONS:  None.   FINDINGS:  1.  LV:  154/8/11.  EF 70% without regional wall motion abnormality.  2.  No aortic stenosis or mitral regurgitation.  3.  Left main:  Angiographically normal.  4.  LAD:  Moderate-sized vessel giving rise to a single small vessel.  It is      angiographically normal.  5.  Circumflex:  Relatively large vessel giving rise to a single large      obtuse marginal.  It is angiographically normal.  6.  RCA:  Large, dominant vessel.  The PDA arises above the acute margin.    It is angiographically normal.   IMPRESSION/RECOMMENDATIONS:  The patient has angiographically normal  coronary arteries and normal left ventricular size and systolic function.  Given possible hypercoagulability associated with her rheumatoid arthritis  as well as shortness of breath accompanying her chest discomfort, will check  D-dimer.  If not normal, then proceed to CT angiogram to rule out pulmonary  embolism.       WED/MEDQ  D:  09/17/2004  T:  09/17/2004  Job:  [id number]   cc:   [name] C. Wall, M.D.   Juline Patch, M.D.  [address]  [place], [place] [address]  Fax: [phone]

## 2010-08-31 NOTE — Op Note (Signed)
[name], MACBETH NO.:  [id number]   MEDICAL RECORD NO.:  [id number]          PATIENT TYPE:  AMB   LOCATION:  ENDO                         FACILITY:  [facility]   PHYSICIAN:  Georgiana Spinner, M.D.    DATE OF BIRTH:  08-29-1940   DATE OF PROCEDURE:  06/18/2004  DATE OF DISCHARGE:                                 OPERATIVE REPORT   PROCEDURE:  Upper endoscopy with dilation.   INDICATIONS:  Dysphagia.   ANESTHESIA:  Fentanyl 75 mcg, Versed 6 mg.   PROCEDURE:  With the patient mildly sedated in the left lateral decubitus  position in room 2 of radiology at [facility], the Olympus  videoscopic endoscope was inserted in the mouth, passed under direct vision  through the esophagus which appeared normal until we reached distal  esophagus, and there again appeared to be a mild stricturing of the distal  esophagus. Photograph taken. We entered into the stomach. Fundus, body,  antrum, duodenal bulb, second portion duodenum were visualized. From this  point, the endoscope was slowly withdrawn taking circumferential views of  duodenal mucosa to the endoscope had been pulled back into the stomach and  placed in retroflexed viewing the stomach from below. The endoscope was  straightened guidewire was passed. Endoscope was withdrawn, and under  fluoroscopic control, a 14 and subsequently 16 Savary dilators were passed  with minimal resistance with a latter. The guidewire was removed. Endoscope  was reinserted. Endoscope was then withdrawn. The patient's vital signs,  pulse, remained stable. The patient will procedure well without apparent  complications.   FINDINGS:  Change to the distal esophagus dilated to 14/16 Savary dilation.      GMO/MEDQ  D:  06/18/2004  T:  06/18/2004  Job:  [id number]

## 2010-10-30 ENCOUNTER — Emergency Department (HOSPITAL_COMMUNITY)
Admission: EM | Admit: 2010-10-30 | Discharge: 2010-10-30 | Disposition: A | Discharge status: Home / Self Care | Payer: BC Managed Care – PPO | Attending: Emergency Medicine | Admitting: Emergency Medicine

## 2010-10-30 DIAGNOSIS — A499 Bacterial infection, unspecified: Secondary | ICD-10-CM | POA: Insufficient documentation

## 2010-10-30 DIAGNOSIS — I10 Essential (primary) hypertension: Secondary | ICD-10-CM | POA: Insufficient documentation

## 2010-10-30 DIAGNOSIS — M25559 Pain in unspecified hip: Secondary | ICD-10-CM | POA: Insufficient documentation

## 2010-10-30 DIAGNOSIS — M069 Rheumatoid arthritis, unspecified: Secondary | ICD-10-CM | POA: Insufficient documentation

## 2010-10-30 DIAGNOSIS — N760 Acute vaginitis: Secondary | ICD-10-CM | POA: Insufficient documentation

## 2010-10-30 DIAGNOSIS — B9689 Other specified bacterial agents as the cause of diseases classified elsewhere: Secondary | ICD-10-CM | POA: Insufficient documentation

## 2010-10-30 LAB — URINALYSIS, ROUTINE W REFLEX MICROSCOPIC
Hgb urine dipstick: NEGATIVE
Nitrite: NEGATIVE
Specific Gravity, Urine: 1.016 (ref 1.005–1.030)
Urobilinogen, UA: 0.2 mg/dL (ref 0.0–1.0)
pH: 7.5 (ref 5.0–8.0)

## 2010-10-30 LAB — WET PREP, GENITAL
Trich, Wet Prep: NONE SEEN
Yeast Wet Prep HPF POC: NONE SEEN

## 2010-11-01 LAB — URINE CULTURE

## 2010-11-14 DIAGNOSIS — E1110 Type 2 diabetes mellitus with ketoacidosis without coma: Secondary | ICD-10-CM

## 2010-11-14 DIAGNOSIS — K625 Hemorrhage of anus and rectum: Secondary | ICD-10-CM

## 2010-11-14 HISTORY — DX: Type 2 diabetes mellitus with ketoacidosis without coma: E11.10

## 2010-11-14 HISTORY — DX: Hemorrhage of anus and rectum: K62.5

## 2010-11-25 ENCOUNTER — Emergency Department (HOSPITAL_COMMUNITY): Payer: Medicare Other

## 2010-11-25 ENCOUNTER — Inpatient Hospital Stay (HOSPITAL_COMMUNITY)
Admission: EM | Admit: 2010-11-25 | Discharge: 2010-11-29 | DRG: 637 | Disposition: A | Discharge status: Home / Self Care | Payer: Medicare Other | Attending: Internal Medicine | Admitting: Internal Medicine

## 2010-11-25 DIAGNOSIS — M1990 Unspecified osteoarthritis, unspecified site: Secondary | ICD-10-CM | POA: Diagnosis present

## 2010-11-25 DIAGNOSIS — F411 Generalized anxiety disorder: Secondary | ICD-10-CM | POA: Diagnosis present

## 2010-11-25 DIAGNOSIS — K649 Unspecified hemorrhoids: Secondary | ICD-10-CM | POA: Diagnosis present

## 2010-11-25 DIAGNOSIS — D62 Acute posthemorrhagic anemia: Secondary | ICD-10-CM | POA: Diagnosis present

## 2010-11-25 DIAGNOSIS — J45909 Unspecified asthma, uncomplicated: Secondary | ICD-10-CM | POA: Diagnosis present

## 2010-11-25 DIAGNOSIS — Z794 Long term (current) use of insulin: Secondary | ICD-10-CM

## 2010-11-25 DIAGNOSIS — M76899 Other specified enthesopathies of unspecified lower limb, excluding foot: Secondary | ICD-10-CM | POA: Diagnosis present

## 2010-11-25 DIAGNOSIS — G4733 Obstructive sleep apnea (adult) (pediatric): Secondary | ICD-10-CM | POA: Diagnosis present

## 2010-11-25 DIAGNOSIS — K219 Gastro-esophageal reflux disease without esophagitis: Secondary | ICD-10-CM | POA: Diagnosis present

## 2010-11-25 DIAGNOSIS — I214 Non-ST elevation (NSTEMI) myocardial infarction: Secondary | ICD-10-CM | POA: Diagnosis not present

## 2010-11-25 DIAGNOSIS — I10 Essential (primary) hypertension: Secondary | ICD-10-CM | POA: Diagnosis present

## 2010-11-25 DIAGNOSIS — E1310 Other specified diabetes mellitus with ketoacidosis without coma: Principal | ICD-10-CM | POA: Diagnosis present

## 2010-11-25 DIAGNOSIS — E860 Dehydration: Secondary | ICD-10-CM | POA: Diagnosis present

## 2010-11-25 DIAGNOSIS — M069 Rheumatoid arthritis, unspecified: Secondary | ICD-10-CM | POA: Diagnosis present

## 2010-11-25 LAB — CBC
HCT: 30 % — ABNORMAL LOW (ref 36.0–46.0)
Hemoglobin: 9.4 g/dL — ABNORMAL LOW (ref 12.0–15.0)
MCH: 26.9 pg (ref 26.0–34.0)
MCHC: 31.3 g/dL (ref 30.0–36.0)
MCV: 85.7 fL (ref 78.0–100.0)
Platelets: 310 K/uL (ref 150–400)
RBC: 3.5 MIL/uL — ABNORMAL LOW (ref 3.87–5.11)
RDW: 17.1 % — ABNORMAL HIGH (ref 11.5–15.5)
WBC: 12.8 K/uL — ABNORMAL HIGH (ref 4.0–10.5)

## 2010-11-25 LAB — BASIC METABOLIC PANEL
CO2: 28 mEq/L (ref 19–32)
Chloride: 92 mEq/L — ABNORMAL LOW (ref 96–112)
Glucose, Bld: 447 mg/dL — ABNORMAL HIGH (ref 70–99)
Potassium: 5.1 mEq/L (ref 3.5–5.1)
Sodium: 132 mEq/L — ABNORMAL LOW (ref 135–145)

## 2010-11-25 LAB — BASIC METABOLIC PANEL WITH GFR
BUN: 17 mg/dL (ref 6–23)
Calcium: 9 mg/dL (ref 8.4–10.5)
Creatinine, Ser: 0.91 mg/dL (ref 0.50–1.10)
GFR calc Af Amer: >60 mL/min (ref >60)
GFR calc non Af Amer: >60 mL/min (ref >60)

## 2010-11-26 ENCOUNTER — Observation Stay (HOSPITAL_COMMUNITY): Payer: Medicare Other

## 2010-11-26 LAB — CBC
HCT: 27.9 % — ABNORMAL LOW (ref 36.0–46.0)
Hemoglobin: 8.5 g/dL — ABNORMAL LOW (ref 12.0–15.0)
MCH: 26.9 pg (ref 26.0–34.0)
MCHC: 30.5 g/dL (ref 30.0–36.0)
MCV: 88.3 fL (ref 78.0–100.0)
Platelets: 347 10*3/uL (ref 150–400)
RBC: 3.16 MIL/uL — ABNORMAL LOW (ref 3.87–5.11)
RDW: 17.6 % — ABNORMAL HIGH (ref 11.5–15.5)
WBC: 20.8 10*3/uL — ABNORMAL HIGH (ref 4.0–10.5)

## 2010-11-26 LAB — BASIC METABOLIC PANEL WITH GFR
BUN: 23 mg/dL (ref 6–23)
CO2: 13 meq/L — ABNORMAL LOW (ref 19–32)
Calcium: 8.7 mg/dL (ref 8.4–10.5)
Calcium: 8.3 mg/dL — ABNORMAL LOW (ref 8.4–10.5)
Chloride: 98 meq/L (ref 96–112)
Creatinine, Ser: 1.17 mg/dL — ABNORMAL HIGH (ref 0.50–1.10)
GFR calc Af Amer: >60 mL/min (ref >60)
GFR calc non Af Amer: >60 mL/min (ref >60)
Glucose, Bld: 178 mg/dL — ABNORMAL HIGH (ref 70–99)
Potassium: 3.3 meq/L — ABNORMAL LOW (ref 3.5–5.1)
Sodium: 136 meq/L (ref 135–145)

## 2010-11-26 LAB — CK TOTAL AND CKMB (NOT AT ARMC)
CK, MB: 5.3 ng/mL — ABNORMAL HIGH (ref 0.3–4.0)
CK, MB: 6.7 ng/mL (ref 0.3–4.0)
Relative Index: INVALID (ref 0.0–2.5)
Relative Index: INVALID (ref 0.0–2.5)
Total CK: 66 U/L (ref 7–177)
Total CK: 94 U/L (ref 7–177)

## 2010-11-26 LAB — POCT I-STAT TROPONIN I: Troponin i, poc: 0.03 ng/mL (ref 0.00–0.08)

## 2010-11-26 LAB — GLUCOSE, CAPILLARY
Glucose-Capillary: >600 mg/dL (ref 70–99)
Glucose-Capillary: 435 mg/dL — ABNORMAL HIGH (ref 70–99)
Glucose-Capillary: 207 mg/dL — ABNORMAL HIGH (ref 70–99)
Glucose-Capillary: 154 mg/dL — ABNORMAL HIGH (ref 70–99)
Glucose-Capillary: 159 mg/dL — ABNORMAL HIGH (ref 70–99)
Glucose-Capillary: 77 mg/dL (ref 70–99)
Glucose-Capillary: 184 mg/dL — ABNORMAL HIGH (ref 70–99)

## 2010-11-26 LAB — BASIC METABOLIC PANEL
BUN: 19 mg/dL (ref 6–23)
BUN: 11 mg/dL (ref 6–23)
CO2: 19 mEq/L (ref 19–32)
CO2: 23 mEq/L (ref 19–32)
Calcium: 8.3 mg/dL — ABNORMAL LOW (ref 8.4–10.5)
Chloride: 103 mEq/L (ref 96–112)
Creatinine, Ser: 1.08 mg/dL (ref 0.50–1.10)
Creatinine, Ser: 0.81 mg/dL (ref 0.50–1.10)
GFR calc Af Amer: 55 mL/min — ABNORMAL LOW (ref >60)
GFR calc non Af Amer: 46 mL/min — ABNORMAL LOW (ref >60)
Glucose, Bld: 590 mg/dL (ref 70–99)
Glucose, Bld: 166 mg/dL — ABNORMAL HIGH (ref 70–99)
Potassium: 5.2 mEq/L — ABNORMAL HIGH (ref 3.5–5.1)
Sodium: 137 mEq/L (ref 135–145)

## 2010-11-26 LAB — URINALYSIS, ROUTINE W REFLEX MICROSCOPIC
Bilirubin Urine: NEGATIVE
Glucose, UA: >1000 mg/dL — AB
Hgb urine dipstick: NEGATIVE
Ketones, ur: >80 mg/dL — AB
Leukocytes, UA: NEGATIVE
Nitrite: NEGATIVE
Protein, ur: NEGATIVE mg/dL
Specific Gravity, Urine: 1.022 (ref 1.005–1.030)
Urobilinogen, UA: 0.2 mg/dL (ref 0.0–1.0)
pH: 5.5 (ref 5.0–8.0)

## 2010-11-26 LAB — DIFFERENTIAL
Basophils Absolute: 0 10*3/uL (ref 0.0–0.1)
Basophils Relative: 0 % (ref 0–1)
Eosinophils Absolute: 0 10*3/uL (ref 0.0–0.7)
Eosinophils Relative: 0 % (ref 0–5)
Lymphocytes Relative: 3 % — ABNORMAL LOW (ref 12–46)
Lymphs Abs: 0.6 10*3/uL — ABNORMAL LOW (ref 0.7–4.0)
Monocytes Absolute: 2 10*3/uL — ABNORMAL HIGH (ref 0.1–1.0)
Monocytes Relative: 9 % (ref 3–12)
Neutro Abs: 18.3 10*3/uL — ABNORMAL HIGH (ref 1.7–7.7)
Neutrophils Relative %: 88 % — ABNORMAL HIGH (ref 43–77)

## 2010-11-26 LAB — HEMOGLOBIN AND HEMATOCRIT, BLOOD
HCT: 26 % — ABNORMAL LOW (ref 36.0–46.0)
HCT: 25.3 % — ABNORMAL LOW (ref 36.0–46.0)
Hemoglobin: 8.4 g/dL — ABNORMAL LOW (ref 12.0–15.0)
Hemoglobin: 8.2 g/dL — ABNORMAL LOW (ref 12.0–15.0)

## 2010-11-26 LAB — URINE MICROSCOPIC-ADD ON

## 2010-11-26 LAB — MAGNESIUM: Magnesium: 2 mg/dL (ref 1.5–2.5)

## 2010-11-26 LAB — TROPONIN I: Troponin I: 0.51 ng/mL (ref <0.30)

## 2010-11-26 LAB — ABO/RH: ABO/RH(D): O POS

## 2010-11-27 DIAGNOSIS — I059 Rheumatic mitral valve disease, unspecified: Secondary | ICD-10-CM

## 2010-11-27 LAB — CBC
HCT: 25.9 % — ABNORMAL LOW (ref 36.0–46.0)
Hemoglobin: 8.2 g/dL — ABNORMAL LOW (ref 12.0–15.0)
MCH: 26.8 pg (ref 26.0–34.0)
MCHC: 31.7 g/dL (ref 30.0–36.0)
MCV: 84.6 fL (ref 78.0–100.0)
Platelets: 336 10*3/uL (ref 150–400)
RBC: 3.06 MIL/uL — ABNORMAL LOW (ref 3.87–5.11)
RDW: 17.6 % — ABNORMAL HIGH (ref 11.5–15.5)
WBC: 12.7 10*3/uL — ABNORMAL HIGH (ref 4.0–10.5)

## 2010-11-27 LAB — BASIC METABOLIC PANEL
BUN: 5 mg/dL — ABNORMAL LOW (ref 6–23)
CO2: 26 mEq/L (ref 19–32)
CO2: 24 mEq/L (ref 19–32)
Calcium: 8.3 mg/dL — ABNORMAL LOW (ref 8.4–10.5)
Calcium: 8.3 mg/dL — ABNORMAL LOW (ref 8.4–10.5)
Chloride: 100 mEq/L (ref 96–112)
Creatinine, Ser: 0.76 mg/dL (ref 0.50–1.10)
Creatinine, Ser: 0.89 mg/dL (ref 0.50–1.10)
GFR calc Af Amer: >60 mL/min (ref >60)
GFR calc Af Amer: >60 mL/min (ref >60)
GFR calc non Af Amer: >60 mL/min (ref >60)
GFR calc non Af Amer: >60 mL/min (ref >60)
Potassium: 2.8 mEq/L — ABNORMAL LOW (ref 3.5–5.1)
Sodium: 131 mEq/L — ABNORMAL LOW (ref 135–145)

## 2010-11-27 LAB — GLUCOSE, CAPILLARY
Glucose-Capillary: 134 mg/dL — ABNORMAL HIGH (ref 70–99)
Glucose-Capillary: 248 mg/dL — ABNORMAL HIGH (ref 70–99)

## 2010-11-27 LAB — CK TOTAL AND CKMB (NOT AT ARMC)
CK, MB: 6.6 ng/mL (ref 0.3–4.0)
Relative Index: 5.8 — ABNORMAL HIGH (ref 0.0–2.5)
Total CK: 113 U/L (ref 7–177)

## 2010-11-27 LAB — IRON AND TIBC
Iron: 29 ug/dL — ABNORMAL LOW (ref 42–135)
Saturation Ratios: 10 % — ABNORMAL LOW (ref 20–55)
TIBC: 283 ug/dL (ref 250–470)
UIBC: 254 ug/dL

## 2010-11-27 LAB — URINE CULTURE
Colony Count: NO GROWTH
Culture  Setup Time: 201208131408
Culture: NO GROWTH

## 2010-11-27 LAB — BASIC METABOLIC PANEL WITH GFR
BUN: 6 mg/dL (ref 6–23)
BUN: 5 mg/dL — ABNORMAL LOW (ref 6–23)
Calcium: 8.1 mg/dL — ABNORMAL LOW (ref 8.4–10.5)
Chloride: 97 meq/L (ref 96–112)
Creatinine, Ser: 0.86 mg/dL (ref 0.50–1.10)
GFR calc Af Amer: >60 mL/min (ref >60)
GFR calc non Af Amer: >60 mL/min (ref >60)
Glucose, Bld: 104 mg/dL — ABNORMAL HIGH (ref 70–99)
Glucose, Bld: 278 mg/dL — ABNORMAL HIGH (ref 70–99)
Potassium: 2.9 meq/L — ABNORMAL LOW (ref 3.5–5.1)
Sodium: 135 meq/L (ref 135–145)

## 2010-11-27 LAB — FERRITIN: Ferritin: 48 ng/mL (ref 10–291)

## 2010-11-27 LAB — CARDIAC PANEL(CRET KIN+CKTOT+MB+TROPI)
CK, MB: 6.4 ng/mL (ref 0.3–4.0)
CK, MB: 4.6 ng/mL — ABNORMAL HIGH (ref 0.3–4.0)
Total CK: 118 U/L (ref 7–177)
Total CK: 116 U/L (ref 7–177)

## 2010-11-27 LAB — VITAMIN B12: Vitamin B-12: 411 pg/mL (ref 211–911)

## 2010-11-27 LAB — OCCULT BLOOD X 1 CARD TO LAB, STOOL: Fecal Occult Bld: POSITIVE

## 2010-11-27 LAB — MAGNESIUM: Magnesium: 1.6 mg/dL (ref 1.5–2.5)

## 2010-11-27 LAB — FOLATE: Folate: >20 ng/mL

## 2010-11-28 DIAGNOSIS — K625 Hemorrhage of anus and rectum: Secondary | ICD-10-CM

## 2010-11-28 LAB — CBC
HCT: 23.8 % — ABNORMAL LOW (ref 36.0–46.0)
Hemoglobin: 7.7 g/dL — ABNORMAL LOW (ref 12.0–15.0)
MCV: 85.3 fL (ref 78.0–100.0)
WBC: 8 10*3/uL (ref 4.0–10.5)

## 2010-11-28 LAB — BASIC METABOLIC PANEL
BUN: 4 mg/dL — ABNORMAL LOW (ref 6–23)
Chloride: 102 mEq/L (ref 96–112)
Glucose, Bld: 84 mg/dL (ref 70–99)
Potassium: 4 mEq/L (ref 3.5–5.1)

## 2010-11-28 LAB — GLUCOSE, CAPILLARY
Glucose-Capillary: 90 mg/dL (ref 70–99)
Glucose-Capillary: 172 mg/dL — ABNORMAL HIGH (ref 70–99)
Glucose-Capillary: 274 mg/dL — ABNORMAL HIGH (ref 70–99)

## 2010-11-28 LAB — PREPARE RBC (CROSSMATCH)

## 2010-11-29 ENCOUNTER — Inpatient Hospital Stay (HOSPITAL_COMMUNITY): Payer: Medicare Other

## 2010-11-29 DIAGNOSIS — R079 Chest pain, unspecified: Secondary | ICD-10-CM

## 2010-11-29 LAB — TYPE AND SCREEN
ABO/RH(D): O POS
Antibody Screen: NEGATIVE
Unit division: 0
Unit division: 0

## 2010-11-29 LAB — CBC
HCT: 36 % (ref 36.0–46.0)
MCHC: 33.1 g/dL (ref 30.0–36.0)
Platelets: 316 10*3/uL (ref 150–400)
RDW: 16.7 % — ABNORMAL HIGH (ref 11.5–15.5)

## 2010-11-29 LAB — LIPID PANEL
Cholesterol: 165 mg/dL (ref 0–200)
VLDL: 18 mg/dL (ref 0–40)

## 2010-11-29 LAB — GLUCOSE, CAPILLARY
Glucose-Capillary: 129 mg/dL — ABNORMAL HIGH (ref 70–99)
Glucose-Capillary: 385 mg/dL — ABNORMAL HIGH (ref 70–99)

## 2010-11-29 MED ORDER — TECHNETIUM TC 99M TETROFOSMIN IV KIT
30.0000 | PACK | Freq: Once | INTRAVENOUS | Status: AC | PRN
  Administered 2010-11-29: 30 via INTRAVENOUS

## 2010-11-29 MED ORDER — TECHNETIUM TC 99M TETROFOSMIN IV KIT
10.0000 | PACK | Freq: Once | INTRAVENOUS | Status: AC | PRN
  Administered 2010-11-29: 10 via INTRAVENOUS

## 2010-12-02 NOTE — Discharge Summary (Signed)
NAMECAILEN, TEXEIRA NO.:  [id number]  MEDICAL RECORD NO.:  [id number]  LOCATION:  5506                         FACILITY:  [facility]  PHYSICIAN:  Conley Canal, MD      DATE OF BIRTH:  [birth year]  DATE OF ADMISSION:  11/25/2010 DATE OF DISCHARGE:  11/29/2010                        DISCHARGE SUMMARY - REFERRING   PRIMARY CARE PHYSICIAN:  Juline Patch, MD  RHEUMATOLOGIST:  Alben Deeds, MD  CONSULTING PHYSICIANS: 1. Noralyn Pick. Eden Emms, MD, [facility] 2. Dr. Juliene Pina.  DISCHARGE DIAGNOSES: 1. Mild diabetic ketoacidosis. 2. Non-ST-segment elevation myocardial infarction, status post normal     Myoview. 3. Acute blood loss anemia secondary to rectal bleeding, thought to     have element of bleeding hemorrhoids. 4. Diabetes mellitus type 2. 5. Gastroesophageal reflux disease. 6. History of distal esophageal stricture, status post dilatation in     2006. 7. Hypertension. 8. History of supraventricular tachycardia.9. Obstructive sleep apnea. 10.Anxiety disorder. 11.History of pericarditis, pleural pericardial effusion, status post     steroid therapy in 2004.  DISCHARGE MEDICATIONS: 1. Colace 100 mg daily as needed. 2. Hydrocortisone 2.5% cream 1 application rectally three times daily. 3. Crestor 10 mg daily. 4. Trinsicon/Foltrin/Ferotrin/Ferocon 1 capsule daily p.c. 5. Budesonide 1 nebulizations twice daily. 6. Folic acid 1 mg daily. 7. Vicodin 5/500 mg every 6 hours as needed.  No new prescription     given. 8. Integra Plus 1 tablet daily. 9. Klor-Con 20 mEq twice daily. 10.Levemir 18 units subcu nightly. 11.Lisinopril 20 mg daily. 12.Lorazepam 1 mg three times daily. 13.Lyrica 75 mg three times daily. 14.Metformin 500 mg twice daily. 15.Methotrexate intramuscularly once a week. 16.Toprol-XL 100 mg daily. 17.Multivitamins 1 tablet daily. 18.Nexium 40 mg twice daily. 19.Aspart insulin sliding scale as needed. 20.Premarin 0.625 mg daily. 21.Tramadol 50  mg every 4 hours as needed. 22.Xopenex 1-2 puffs every 6 hours as needed.  PROCEDURES PERFORMED: 1. Myoview on November 29, 2010, was normal. 2. Chest x-ray on December 01, 2010, showed no acute cardiopulmonary     disease but some stable mild cardiomegaly. 3. MRI of the right hip without contrast on December 01, 2010, showing     no acute findings. 4. A 2-D echocardiogram on November 27, 2010, showed EF 55-60% with mild     mitral regurgitation.  HOSPITAL COURSE:  Ms. Marisa [name] is an extremely pleasant 70 year old female who came to the hospital because she fell at home and complained of pain of the right hip hence concern for a fracture.  In the emergency room, she was found to be tachycardic with heart rate in the 140s and she was in mild DKA with an anion gap of 27.  The patient was hence admitted for management of the DKA, and she later on was noted to have rectal bleeding with acute blood loss anemia, hence GI consulted.  The lowest hemoglobin was 7.7, hematocrit 23.8 prompting PRBC transfusion. She received 2 units of PRBC with an appropriate response, and on the day of discharge, her hemoglobin is 11.9, hematocrit 36, platelet count 316.  The patient was also noted to have positive troponins prompting cardiac workup including Myoview done by Dr. Charlton Haws which was essentially normal.  The positive troponins were probably related to ongoing GI and endocrinological issues.  The patient was seen by Gastroenterology because of the rectal bleeding.  Their feeling was that she might just have hemorrhoidal bleeding and that she could have ischemic colitis which would be self-limiting.  At this point, Gastroenterology decided to watch.  If she rebleeds, they would consider endoscopy.  Today, she feels better.  Denies any complaints.  She should follow with Dr. Juline Patch as well as Dr. Dierdre Forth.  Her labs include WBC 6.8.  Lipid panel showing HDL 93, LDL 54, TSH 2.39. Urine culture  showing no growth.  The patient is discharged in stable condition.  Time spent for discharge preparation is less than 30 minutes.     Conley Canal, MD     SR/MEDQ  D:  11/29/2010  T:  11/29/2010  Job:  [id number]  cc:   Dr. Alphonzo Lemmings, M.D. Noralyn Pick. Eden Emms, MD, [facility] Alben Deeds, MD  Electronically Signed by Conley Canal  on 12/02/2010 01:34:45 PM

## 2010-12-06 NOTE — H&P (Signed)
NAMEALLECIA, BELLS NO.:  [id number]  MEDICAL RECORD NO.:  [id number]  LOCATION:  2105                         FACILITY:  [facility]  PHYSICIAN:  Tarry Kos, MD       DATE OF BIRTH:  06-12-1940  DATE OF ADMISSION:  11/25/2010 DATE OF DISCHARGE:                             HISTORY & PHYSICAL   CHIEF COMPLAINT:  DKA.  HISTORY OF PRESENT ILLNESS:  Ms. Skorupski is a 70 year old female with rheumatoid arthritis, insulin-dependent diabetes, hypertension who presented to the ED last night because of right hip pain.  She came in because she had fallen and hurt her hip and was kept overnight to get MRI of her hip which was negative for any acute fracture.  However, she has not received any insulin either since she has been here and has been tachycardic since her arrival with sinus tachycardia up into the 140s. She has not eaten anything.  She did not eat anything last night or this morning.  Her glucose has been as high as 600 and she has not received any insulin in the ED.  Overnight, she has gone into DKA with an anion gap of about 27.  We are being asked to admit the patient for DKA.  As I am seeing Ms. Helmers, she is actually ready to go home as she has been here since last night.  Her hip is much better.  She says she has not been given anything to eat.  Prior to her arrival last night, she has been in her normal state of health.  She denies or any recent illnesses, fevers, nausea, vomiting, or diarrhea.  REVIEW OF SYSTEMS:  Otherwise negative.  PAST MEDICAL HISTORY: 1. Rheumatoid arthritis for which she is on chronic steroids and     methotrexate which was recently started. 2. Insulin-dependent diabetes. 3. GERD. 4. Hypertension. 5. Osteoarthritis.  ALLERGIES:  PENICILLIN causes swelling.  DEMEROL causes hallucination, psychosis.  CODEINE unknown.  OXYCODONE nausea, vomiting.  SOCIAL HISTORY:  She does not smoke.  No alcohol.  No IV drug abuse. Her  husband is with her right now.  MEDICATIONS:  Hydrocodone, lorazepam, metformin, Nexium, NovoLog, folic acid, prednisone, Premarin, lisinopril, Toprol-XL, lorazepam, Lyrica, potassium, magnesium, Levemir, methotrexate. She is Levemir 18 units daily.  PAST MEDICAL HISTORY:  She is a has a history of asthma for which she is on chronically 3 liters nasal cannula continuous at home.  PHYSICAL EXAMINATION:  VITALS:  Temperature is 98, blood pressure has been as high as 195/93, this morning it was 145/63, her heart rate has been between 110 and 145, this morning 141, respiration 20, 100% O2 sats on 3 liters nasal cannula. GENERAL:  She is alert and oriented x4.  No apparent distress, cooperative friendly. HEENT:  Extraocular muscles intact.  Pupils equal and reactive to light. Oropharynx clear.  Mucous membranes dry. NECK:  No JVD.  No carotid bruits. COR:  Tachycardic.  Regular rhythm without murmurs, rubs, or gallops. CHEST:  Clear to auscultation bilaterally.  No wheezes, rhonchi, or rales. ABDOMEN:  Soft, nontender, nondistended.  Positive bowel sounds.  No hepatosplenomegaly. EXTREMITIES:  No clubbing, cyanosis, or edema. PSYCH:  Normal mood and  affect. NEURO:  No focal neurologic deficits. MUSCULOSKELETAL:  She has full range of motion of bilateral hips. Pelvis is intact and nonpainful to pressure.  LABORATORY DATA:  Troponin is negative.  This morning her potassium is 5.2, BUN and creatinine 23 and 1.17, CO2 is 13.  Chloride 98, sodium 137, sugars 590.  Magnesium is 2, hemoglobin 8.5.  Her white count is 20.5.  Her white count is elevated from yesterday, it was 12.8.  Her chest x-ray is negative.  MRI of the right hip shows a tendinitis and partial tearing of the left common hamstring tendon.  A 12-lead EKG, sinus tachycardia without any acute ST-T wave changes.  ASSESSMENT/PLAN:  This is a 70 year old female in mild diabetic ketoacidosis. 1. Mild diabetic ketoacidosis.  We  will place her on at diabetic     ketoacidosis protocol, IV fluids.  Monitor her sugars closely.  She     will likely turn around quickly as this is due to not receiving her     insulin over less than 24 hours, provided with aggressive IV     fluids.  Her sinus tachycardia is likely also due to dehydration     and diabetic ketoacidosis.  Her cardiac enzymes are negative.  I am     going to continue to serial those.  She has not received any of her     home medications either.  The med rec sheet is completed on the ED     chart.  I will resume her beta-blocker.  This will likely also help     with her high blood pressure. 2. Rheumatoid arthritis.  She is chronically on steroids.  I will     continue that and her methotrexate.  There is no obvious infectious     issues. 3. Right hip pain.  She will need orthopedic followup for her     tendinitis that is revealed on her MRI and a partial tearing of the     left common hamstring tendon which is probably contributed to by     her chronic steroid use but she does have a pretty severe     rheumatoid arthritis.  We will obtain a physical therapy     evaluation. 4. The patient is full code.  Further recommendations depending on     overall hospital course.          ______________________________ Tarry Kos, MD     RD/MEDQ  D:  11/26/2010  T:  11/26/2010  Job:  [id number]  Electronically Signed by Tarry Kos MD on 12/06/2010 10:55:38 AM

## 2010-12-11 NOTE — Consult Note (Signed)
[name], [name] NO.:  [id number]  MEDICAL RECORD NO.:  [id number]  LOCATION:  5506                         FACILITY:  [facility]  PHYSICIAN:  Marisa Pick. Eden Emms, MD, FACCDATE OF BIRTH:  1941/04/08  DATE OF CONSULTATION:  11/28/2010 DATE OF DISCHARGE:                                CONSULTATION   PRIMARY CARDIOLOGIST:  Marisa Sans. Wall, MD, [facility]  PRIMARY CARE PROVIDER:  Juline Patch, MD  PATIENT PROFILE:  A 70 year old female with history of chest pain status post normal catheterization in 2006 as well as diabetes mellitus who presented to the ED on August 12 following a fall with right hip pain and was found to have DKA and subsequently positive troponins.  PROBLEM LIST: 1. Non-ST segment elevation myocardial infarction.     a.     History of chest pain in June 2006 with normal coronary      arteries by catheterization on September 17, 2004. 2. History of pericarditis/pleural pericardial effusion status post steroid therapy in September 2004. 3. Diabetes mellitus/diabetic ketoacidosis, first diagnosed in August     2010. 4. Rheumatoid arthritis, on methotrexate therapy. 5. Gastroesophageal reflux disease. 6. Dysphagia. 7. History of distal esophageal stricture status post dilatation in     March 2006. 8. Hypertension. 9. History of supraventricular tachycardia. 10.Hemorrhoids. 11.Headaches. 12.Asthma on the 3 L of home O2. 13.Obstructive sleep apnea. 14.Status post cholecystectomy and hysterectomy. 15.Anxiety. 16.Orthopnea. 17.History of tonic-clonic seizure in August 2010. 18.Osteoarthritis. 19.Fecal occult blood positive stool. 20.Acute anemia.  ALLERGIES:  PENICILLIN, CODEINE, DEMEROL, OXYCODONE.  HISTORY OF PRESENT ILLNESS:  A 70 year old female with the above problem list.  The patient fell on August 12 and developed right hip pain.  X- ray and MRI were performed in the ED showing no acute fracture.  The patient was n.p.o. on the ED and therefore  did not receive her usual dose of insulin and was noted to be tachycardic.  Subsequent blood glucose was elevated at 600 with an anion gap of 27.  The patient was admitted on August 13 by Internal Medicine, treated for DKA.  Since admission she has been anemic (7.7 and 23.8 this morning) with guaiac positive stool and a "dark black" stool on August 13.  She has been evaluated by Gastroenterology who suspect that this is related to hemorrhoids.  The patient has also been running with a low grade fever and has been found to have elevation in her CK-MB and troponin with peaks of 6.7 and 0.63 respectively.  She has not had any chest pain or dyspnea.  Because of positive markers, we have been asked to evaluate.  CURRENT MEDICATIONS: 1. Pulmicort 0.25 mg inhale b.i.d. 2. Premarin 0.625 mg daily. 3. Colace 200 mg daily. 4. Nexium 40 mg b.i.d. 5. Folic acid 1 mg daily. 6. Hydrocortisone/Anusol rectally t.i.d. 7. NovoLog sliding scale insulin. 8. Lantus 18 units daily. 9. Lisinopril 20 mg daily. 10.Lorazepam 1 mg t.i.d. 11.Methotrexate 16.5 mg intramuscularly every Sunday. 12.Toprol-XL 100 mg daily. 13.Multivitamin daily. 14.Pregabalin 75 mg t.i.d. 15  Trinsicon 1 cap daily.  FAMILY HISTORY:  Mother died at 48 with diabetes and heart failure. Father died at 61 with CAD and stroke.  Four sisters and a brother no CAD.  SOCIAL HISTORY:  The patient lives in [place] with her husband.  She denies tobacco, alcohol or drug use, not routinely exercising.  She uses a cane at home and also wears oxygen.  REVIEW OF SYSTEMS:  Positive for chronic dyspnea on exertion, on home O2.  She reports right hip pain though has chronic joint pains related to her rheumatoid arthritis.  She has a history of diabetes.  She is full code.  Otherwise all systems reviewed negative.  PHYSICAL EXAMINATION:  VITAL SIGNS:  Temperature 99.0, heart rate 93, respirations 14, blood pressure 133/78, pulse ox 95% on  room air. GENERAL:  Pleasant white female in no acute distress, awake, alert and oriented x3.  She has normal affect. HEENT:  Normal. NEUROLOGY:  Grossly intact and nonfocal. SKIN:  Warm and dry without lesions or masses. NECK:  Supple without bruits or JVD. LUNGS:  Respirations are regular and unlabored, clear to auscultation. CARDIAC:  Regular S1 and S2.  No S3, S4, murmurs. ABDOMEN:  Round, soft, nontender, nondistended.  Bowel sounds present x4. EXTREMITIES:  Warm, dry, and pink.  No clubbing, cyanosis or edema. Dorsalis pedis and posterior tibial pulses are 2+ and equal bilaterally.  Chest x-ray shows stable mild cardiomegaly.  No acute cardiopulmonary disease.  Echo November 27, 2010, shows EF of 55-60%, mild MR.  She refused any additional examination.  EKG shows sinus tachycardia, rate of 116, normal axis.  She has slight inferolateral upsloping ST depression.  Hemoglobin is 7.7, hematocrit 23.8, WBC 8.0, platelets 277. Sodium 135, potassium 4.0, chloride 102, CO2 30, BUN 4, creatinine 0.93, glucose 84.  She has had 6 sets of cardiac enzymes, the last showed CK 105, MB of 4.6, troponin-I 0.39.  Fecal occult blood was positive. Serum iron 29.  Folate greater than 20.  TIBC 283, ferritin 48, B12 411, magnesium 1.6. Urine culture shows no growth.  ASSESSMENT AND PLAN:  Non-ST-elevation myocardial infarction.  This occurred in the setting of anemia and DKA.  She had normal coronary by catheterization in 2006.  She had normal LV function by echo yesterday. She denies chest pain or dyspnea.  PLAN: 1. Myoview in the a.m.  We will transfuse 2 units of packed red blood     cells with goal of 10/30.  Continue beta-blocker and ACE inhibitor.     Hold off an aspirin for now with question of GI bleed.  Add statin. 2. Anemia.  Seen by GI with suspicion for bleeding hemorrhoids.  In     the setting of non-STEMI, transfuse as above. 3. DKA/insulin-dependent diabetes mellitus per primary  team.     Marisa [name], ANP   ______________________________ Marisa Pick. Eden Emms, MD, [facility]    CB/MEDQ  D:  11/28/2010  T:  11/29/2010  Job:  [id number]  Electronically Signed by Marisa [name] ANP on 12/06/2010 03:02:00 PM Electronically Signed by Charlton Haws MD [facility] on 12/11/2010 02:03:10 PM

## 2010-12-20 ENCOUNTER — Telehealth: Payer: Self-pay | Admitting: Gastroenterology

## 2010-12-20 NOTE — Telephone Encounter (Signed)
Patient is rescheduled for an appt tomorrow with Willette Cluster RNP  I spoke with the patient and she will come on Monday 12/24/10 10:00 I have left a message for Dennie Bible to call back

## 2010-12-20 NOTE — Telephone Encounter (Signed)
The patient is scheduled for 12/24/10, she can't come tomorrow.  Pat aware.  They will send records

## 2010-12-21 ENCOUNTER — Ambulatory Visit: Payer: BC Managed Care – PPO | Admitting: Nurse Practitioner

## 2010-12-24 ENCOUNTER — Ambulatory Visit: Payer: BC Managed Care – PPO | Admitting: Physician Assistant

## 2011-01-16 ENCOUNTER — Ambulatory Visit: Payer: BC Managed Care – PPO | Admitting: Gastroenterology

## 2011-01-23 LAB — CBC
MCHC: 34.3
MCV: 89.7
Platelets: 366

## 2011-01-23 LAB — DIFFERENTIAL
Basophils Absolute: 0.1
Lymphocytes Relative: 10 — ABNORMAL LOW
Monocytes Absolute: 1.1 — ABNORMAL HIGH
Neutro Abs: 12.8 — ABNORMAL HIGH

## 2011-01-23 LAB — URINE MICROSCOPIC-ADD ON

## 2011-01-23 LAB — BASIC METABOLIC PANEL
BUN: 9
CO2: 27
Chloride: 94 — ABNORMAL LOW
Creatinine, Ser: 1.15

## 2011-01-23 LAB — URINALYSIS, ROUTINE W REFLEX MICROSCOPIC
Nitrite: NEGATIVE
Specific Gravity, Urine: 1.037 — ABNORMAL HIGH
pH: 6

## 2011-04-30 DIAGNOSIS — M62838 Other muscle spasm: Secondary | ICD-10-CM | POA: Diagnosis not present

## 2011-06-10 DIAGNOSIS — E559 Vitamin D deficiency, unspecified: Secondary | ICD-10-CM | POA: Diagnosis not present

## 2011-06-12 DIAGNOSIS — J45909 Unspecified asthma, uncomplicated: Secondary | ICD-10-CM | POA: Diagnosis not present

## 2011-08-15 DIAGNOSIS — M069 Rheumatoid arthritis, unspecified: Secondary | ICD-10-CM | POA: Diagnosis not present

## 2011-08-15 DIAGNOSIS — M545 Low back pain: Secondary | ICD-10-CM | POA: Diagnosis not present

## 2011-08-21 DIAGNOSIS — E559 Vitamin D deficiency, unspecified: Secondary | ICD-10-CM | POA: Diagnosis not present

## 2011-08-21 DIAGNOSIS — Z0000 Encounter for general adult medical examination without abnormal findings: Secondary | ICD-10-CM | POA: Diagnosis not present

## 2011-08-21 DIAGNOSIS — I10 Essential (primary) hypertension: Secondary | ICD-10-CM | POA: Diagnosis not present

## 2011-09-14 HISTORY — PX: INCISE AND DRAIN ABCESS: PRO64

## 2011-09-29 ENCOUNTER — Emergency Department (HOSPITAL_COMMUNITY): Payer: Medicare Other

## 2011-09-29 ENCOUNTER — Encounter (HOSPITAL_COMMUNITY): Payer: Self-pay | Admitting: Family Medicine

## 2011-09-29 ENCOUNTER — Inpatient Hospital Stay (HOSPITAL_COMMUNITY)
Admission: EM | Admit: 2011-09-29 | Discharge: 2011-10-08 | DRG: 854 | Disposition: A | Discharge status: Other Acute Inpatient Hospital | Payer: Medicare Other | Attending: Internal Medicine | Admitting: Internal Medicine

## 2011-09-29 DIAGNOSIS — E46 Unspecified protein-calorie malnutrition: Secondary | ICD-10-CM | POA: Diagnosis present

## 2011-09-29 DIAGNOSIS — E876 Hypokalemia: Secondary | ICD-10-CM | POA: Clinically undetermined

## 2011-09-29 DIAGNOSIS — R6889 Other general symptoms and signs: Secondary | ICD-10-CM | POA: Diagnosis not present

## 2011-09-29 DIAGNOSIS — E875 Hyperkalemia: Secondary | ICD-10-CM | POA: Diagnosis not present

## 2011-09-29 DIAGNOSIS — A4902 Methicillin resistant Staphylococcus aureus infection, unspecified site: Secondary | ICD-10-CM | POA: Diagnosis present

## 2011-09-29 DIAGNOSIS — Z794 Long term (current) use of insulin: Secondary | ICD-10-CM | POA: Diagnosis present

## 2011-09-29 DIAGNOSIS — L03317 Cellulitis of buttock: Secondary | ICD-10-CM | POA: Diagnosis not present

## 2011-09-29 DIAGNOSIS — I252 Old myocardial infarction: Secondary | ICD-10-CM | POA: Diagnosis not present

## 2011-09-29 DIAGNOSIS — K612 Anorectal abscess: Secondary | ICD-10-CM | POA: Diagnosis present

## 2011-09-29 DIAGNOSIS — I10 Essential (primary) hypertension: Secondary | ICD-10-CM | POA: Diagnosis present

## 2011-09-29 DIAGNOSIS — J449 Chronic obstructive pulmonary disease, unspecified: Secondary | ICD-10-CM | POA: Diagnosis present

## 2011-09-29 DIAGNOSIS — A419 Sepsis, unspecified organism: Secondary | ICD-10-CM | POA: Diagnosis not present

## 2011-09-29 DIAGNOSIS — E1310 Other specified diabetes mellitus with ketoacidosis without coma: Secondary | ICD-10-CM | POA: Diagnosis not present

## 2011-09-29 DIAGNOSIS — E162 Hypoglycemia, unspecified: Secondary | ICD-10-CM | POA: Diagnosis not present

## 2011-09-29 DIAGNOSIS — E1165 Type 2 diabetes mellitus with hyperglycemia: Secondary | ICD-10-CM

## 2011-09-29 DIAGNOSIS — E1010 Type 1 diabetes mellitus with ketoacidosis without coma: Secondary | ICD-10-CM | POA: Diagnosis not present

## 2011-09-29 DIAGNOSIS — D649 Anemia, unspecified: Secondary | ICD-10-CM | POA: Diagnosis not present

## 2011-09-29 DIAGNOSIS — G4733 Obstructive sleep apnea (adult) (pediatric): Secondary | ICD-10-CM | POA: Diagnosis present

## 2011-09-29 DIAGNOSIS — E871 Hypo-osmolality and hyponatremia: Secondary | ICD-10-CM | POA: Diagnosis present

## 2011-09-29 DIAGNOSIS — M069 Rheumatoid arthritis, unspecified: Secondary | ICD-10-CM | POA: Diagnosis not present

## 2011-09-29 DIAGNOSIS — IMO0002 Reserved for concepts with insufficient information to code with codable children: Secondary | ICD-10-CM

## 2011-09-29 DIAGNOSIS — L0231 Cutaneous abscess of buttock: Secondary | ICD-10-CM | POA: Diagnosis present

## 2011-09-29 DIAGNOSIS — D62 Acute posthemorrhagic anemia: Secondary | ICD-10-CM | POA: Diagnosis not present

## 2011-09-29 DIAGNOSIS — J4489 Other specified chronic obstructive pulmonary disease: Secondary | ICD-10-CM | POA: Diagnosis present

## 2011-09-29 DIAGNOSIS — E1169 Type 2 diabetes mellitus with other specified complication: Secondary | ICD-10-CM | POA: Diagnosis not present

## 2011-09-29 DIAGNOSIS — R112 Nausea with vomiting, unspecified: Secondary | ICD-10-CM | POA: Diagnosis not present

## 2011-09-29 DIAGNOSIS — R000 Tachycardia, unspecified: Secondary | ICD-10-CM | POA: Diagnosis present

## 2011-09-29 DIAGNOSIS — E1110 Type 2 diabetes mellitus with ketoacidosis without coma: Secondary | ICD-10-CM | POA: Diagnosis not present

## 2011-09-29 DIAGNOSIS — R6510 Systemic inflammatory response syndrome (SIRS) of non-infectious origin without acute organ dysfunction: Secondary | ICD-10-CM

## 2011-09-29 DIAGNOSIS — I517 Cardiomegaly: Secondary | ICD-10-CM | POA: Diagnosis not present

## 2011-09-29 DIAGNOSIS — K219 Gastro-esophageal reflux disease without esophagitis: Secondary | ICD-10-CM | POA: Diagnosis not present

## 2011-09-29 DIAGNOSIS — E119 Type 2 diabetes mellitus without complications: Secondary | ICD-10-CM | POA: Diagnosis present

## 2011-09-29 DIAGNOSIS — L089 Local infection of the skin and subcutaneous tissue, unspecified: Secondary | ICD-10-CM | POA: Diagnosis not present

## 2011-09-29 DIAGNOSIS — R0602 Shortness of breath: Secondary | ICD-10-CM | POA: Diagnosis not present

## 2011-09-29 DIAGNOSIS — B9562 Methicillin resistant Staphylococcus aureus infection as the cause of diseases classified elsewhere: Secondary | ICD-10-CM | POA: Diagnosis present

## 2011-09-29 DIAGNOSIS — D539 Nutritional anemia, unspecified: Secondary | ICD-10-CM | POA: Diagnosis not present

## 2011-09-29 HISTORY — DX: Anemia, unspecified: D64.9

## 2011-09-29 HISTORY — DX: Unspecified convulsions: R56.9

## 2011-09-29 HISTORY — DX: Hemorrhage of anus and rectum: K62.5

## 2011-09-29 LAB — GLUCOSE, CAPILLARY: Glucose-Capillary: 483 mg/dL — ABNORMAL HIGH (ref 70–99)

## 2011-09-29 LAB — BASIC METABOLIC PANEL
BUN: 13 mg/dL (ref 6–23)
CO2: 25 mEq/L (ref 19–32)
Calcium: 8.5 mg/dL (ref 8.4–10.5)
GFR calc non Af Amer: 53 mL/min — ABNORMAL LOW (ref >90)
Glucose, Bld: 310 mg/dL — ABNORMAL HIGH (ref 70–99)
Potassium: 4.6 mEq/L (ref 3.5–5.1)

## 2011-09-29 LAB — DIFFERENTIAL
Eosinophils Absolute: 0.1 10*3/uL (ref 0.0–0.7)
Eosinophils Relative: 1 % (ref 0–5)
Lymphocytes Relative: 6 % — ABNORMAL LOW (ref 12–46)
Lymphs Abs: 1 10*3/uL (ref 0.7–4.0)
Monocytes Relative: 10 % (ref 3–12)

## 2011-09-29 LAB — CBC
HCT: 25.9 % — ABNORMAL LOW (ref 36.0–46.0)
Hemoglobin: 8.1 g/dL — ABNORMAL LOW (ref 12.0–15.0)
MCH: 27.7 pg (ref 26.0–34.0)
MCV: 88.7 fL (ref 78.0–100.0)
RBC: 2.92 MIL/uL — ABNORMAL LOW (ref 3.87–5.11)

## 2011-09-29 MED ORDER — FLUTICASONE PROPIONATE HFA 44 MCG/ACT IN AERO
1.0000 | INHALATION_SPRAY | Freq: Two times a day (BID) | RESPIRATORY_TRACT | Status: DC
  Administered 2011-09-30 – 2011-10-08 (×15): 1 via RESPIRATORY_TRACT
  Filled 2011-09-29: qty 10.6

## 2011-09-29 MED ORDER — SENNA 8.6 MG PO TABS
1.0000 | ORAL_TABLET | Freq: Two times a day (BID) | ORAL | Status: DC
  Administered 2011-09-30 – 2011-10-07 (×9): 8.6 mg via ORAL
  Filled 2011-09-29 (×10): qty 1

## 2011-09-29 MED ORDER — INSULIN ASPART 100 UNIT/ML ~~LOC~~ SOLN
6.0000 [IU] | Freq: Once | SUBCUTANEOUS | Status: AC
  Administered 2011-09-30: 6 [IU] via SUBCUTANEOUS

## 2011-09-29 MED ORDER — ENOXAPARIN SODIUM 40 MG/0.4ML ~~LOC~~ SOLN
40.0000 mg | Freq: Every day | SUBCUTANEOUS | Status: DC
  Administered 2011-09-30 – 2011-10-07 (×9): 40 mg via SUBCUTANEOUS
  Filled 2011-09-29 (×10): qty 0.4

## 2011-09-29 MED ORDER — ALBUTEROL SULFATE (5 MG/ML) 0.5% IN NEBU: 2.5000 mg | INHALATION_SOLUTION | Freq: Four times a day (QID) | RESPIRATORY_TRACT | Status: DC | PRN

## 2011-09-29 MED ORDER — HYDROMORPHONE HCL PF 1 MG/ML IJ SOLN
0.5000 mg | INTRAMUSCULAR | Status: DC | PRN
  Administered 2011-09-30: 0.5 mg via INTRAVENOUS
  Filled 2011-09-29: qty 1

## 2011-09-29 MED ORDER — INSULIN ASPART 100 UNIT/ML ~~LOC~~ SOLN
0.0000 [IU] | Freq: Three times a day (TID) | SUBCUTANEOUS | Status: DC
  Administered 2011-09-30: 15 [IU] via SUBCUTANEOUS

## 2011-09-29 MED ORDER — PREGABALIN 50 MG PO CAPS
75.0000 mg | ORAL_CAPSULE | Freq: Three times a day (TID) | ORAL | Status: DC
  Administered 2011-09-30: 75 mg via ORAL
  Filled 2011-09-29: qty 1

## 2011-09-29 MED ORDER — FOLIC ACID 1 MG PO TABS
1.0000 mg | ORAL_TABLET | Freq: Every day | ORAL | Status: DC
  Filled 2011-09-29: qty 1

## 2011-09-29 MED ORDER — VANCOMYCIN HCL 500 MG IV SOLR
500.0000 mg | INTRAVENOUS | Status: DC
  Administered 2011-09-30 – 2011-10-01 (×2): 500 mg via INTRAVENOUS
  Filled 2011-09-29 (×3): qty 500

## 2011-09-29 MED ORDER — ACETAMINOPHEN 325 MG PO TABS
650.0000 mg | ORAL_TABLET | Freq: Four times a day (QID) | ORAL | Status: DC | PRN
  Administered 2011-09-30 (×2): 650 mg via ORAL
  Filled 2011-09-29 (×2): qty 2

## 2011-09-29 MED ORDER — VANCOMYCIN HCL IN DEXTROSE 1-5 GM/200ML-% IV SOLN
1000.0000 mg | Freq: Once | INTRAVENOUS | Status: AC
  Administered 2011-09-29: 1000 mg via INTRAVENOUS
  Filled 2011-09-29: qty 200

## 2011-09-29 MED ORDER — INSULIN DETEMIR 100 UNIT/ML ~~LOC~~ SOLN
20.0000 [IU] | Freq: Every day | SUBCUTANEOUS | Status: DC
  Filled 2011-09-29: qty 10

## 2011-09-29 MED ORDER — SODIUM CHLORIDE 0.9 % IV SOLN: INTRAVENOUS | Status: DC

## 2011-09-29 MED ORDER — ACETAMINOPHEN 650 MG RE SUPP: 650.0000 mg | Freq: Four times a day (QID) | RECTAL | Status: DC | PRN

## 2011-09-29 MED ORDER — LISINOPRIL 10 MG PO TABS
10.0000 mg | ORAL_TABLET | Freq: Every day | ORAL | Status: DC
  Filled 2011-09-29: qty 1

## 2011-09-29 MED ORDER — ONDANSETRON HCL 4 MG/2ML IJ SOLN
4.0000 mg | Freq: Four times a day (QID) | INTRAMUSCULAR | Status: DC | PRN
  Administered 2011-10-02: 4 mg via INTRAVENOUS
  Filled 2011-09-29 (×2): qty 2

## 2011-09-29 MED ORDER — ONDANSETRON HCL 4 MG PO TABS: 4.0000 mg | ORAL_TABLET | Freq: Four times a day (QID) | ORAL | Status: DC | PRN

## 2011-09-29 MED ORDER — TRAMADOL HCL 50 MG PO TABS
50.0000 mg | ORAL_TABLET | Freq: Four times a day (QID) | ORAL | Status: DC | PRN
  Administered 2011-10-02 – 2011-10-06 (×2): 50 mg via ORAL
  Filled 2011-09-29 (×2): qty 1

## 2011-09-29 MED ORDER — HYDROCODONE-ACETAMINOPHEN 5-325 MG PO TABS
1.0000 | ORAL_TABLET | ORAL | Status: DC | PRN
  Administered 2011-10-01: 2 via ORAL
  Administered 2011-10-01 (×2): 1 via ORAL
  Administered 2011-10-01 – 2011-10-02 (×3): 2 via ORAL
  Administered 2011-10-02 – 2011-10-03 (×3): 1 via ORAL
  Administered 2011-10-03 (×2): 2 via ORAL
  Administered 2011-10-04: 1 via ORAL
  Administered 2011-10-04 – 2011-10-06 (×7): 2 via ORAL
  Filled 2011-09-29: qty 1
  Filled 2011-09-29 (×3): qty 2
  Filled 2011-09-29: qty 1
  Filled 2011-09-29: qty 2
  Filled 2011-09-29: qty 1
  Filled 2011-09-29: qty 2
  Filled 2011-09-29: qty 1
  Filled 2011-09-29 (×3): qty 2
  Filled 2011-09-29: qty 1
  Filled 2011-09-29 (×3): qty 2
  Filled 2011-09-29: qty 1
  Filled 2011-09-29 (×2): qty 2

## 2011-09-29 MED ORDER — PREDNISONE 10 MG PO TABS
10.0000 mg | ORAL_TABLET | Freq: Every day | ORAL | Status: DC
  Administered 2011-10-01 – 2011-10-08 (×8): 10 mg via ORAL
  Filled 2011-09-29 (×9): qty 1

## 2011-09-29 MED ORDER — SODIUM CHLORIDE 0.9 % IV SOLN
INTRAVENOUS | Status: DC
  Administered 2011-09-30: 1000 mL via INTRAVENOUS
  Administered 2011-09-30: 14:00:00 via INTRAVENOUS
  Administered 2011-09-30: 1000 mL via INTRAVENOUS
  Administered 2011-10-01: 200 mL/h via INTRAVENOUS
  Administered 2011-10-01: 10:00:00 via INTRAVENOUS
  Administered 2011-10-02: 100 mL/h via INTRAVENOUS

## 2011-09-29 MED ORDER — ONDANSETRON HCL 4 MG/2ML IJ SOLN
4.0000 mg | Freq: Three times a day (TID) | INTRAMUSCULAR | Status: DC | PRN
  Administered 2011-09-30: 4 mg via INTRAVENOUS

## 2011-09-29 MED ORDER — LORAZEPAM 1 MG PO TABS
1.0000 mg | ORAL_TABLET | Freq: Three times a day (TID) | ORAL | Status: DC
  Administered 2011-09-30 – 2011-10-08 (×24): 1 mg via ORAL
  Filled 2011-09-29 (×24): qty 1

## 2011-09-29 MED ORDER — ESTROGENS CONJUGATED 0.625 MG PO TABS
0.6250 mg | ORAL_TABLET | Freq: Every day | ORAL | Status: DC
  Administered 2011-10-01: 0.625 mg via ORAL
  Filled 2011-09-29 (×2): qty 1

## 2011-09-29 MED ORDER — DOCUSATE SODIUM 100 MG PO CAPS
100.0000 mg | ORAL_CAPSULE | Freq: Two times a day (BID) | ORAL | Status: DC
  Administered 2011-09-30 – 2011-10-07 (×10): 100 mg via ORAL
  Filled 2011-09-29 (×19): qty 1

## 2011-09-29 MED ORDER — SODIUM CHLORIDE 0.9 % IV BOLUS (SEPSIS)
1000.0000 mL | Freq: Once | INTRAVENOUS | Status: AC
  Administered 2011-09-29: 1000 mL via INTRAVENOUS

## 2011-09-29 MED ORDER — IOHEXOL 300 MG/ML  SOLN
100.0000 mL | Freq: Once | INTRAMUSCULAR | Status: AC | PRN
  Administered 2011-09-29: 100 mL via INTRAVENOUS

## 2011-09-29 MED ORDER — LEFLUNOMIDE 20 MG PO TABS
20.0000 mg | ORAL_TABLET | Freq: Every day | ORAL | Status: DC
  Administered 2011-10-01 – 2011-10-08 (×8): 20 mg via ORAL
  Filled 2011-09-29 (×10): qty 1

## 2011-09-29 MED ORDER — METOPROLOL SUCCINATE ER 100 MG PO TB24
100.0000 mg | ORAL_TABLET | Freq: Every day | ORAL | Status: DC
  Administered 2011-09-30 – 2011-10-08 (×9): 100 mg via ORAL
  Filled 2011-09-29 (×9): qty 1

## 2011-09-29 NOTE — ED Notes (Signed)
[id number]<YN> Expected date:09/29/11<BR> Expected time: 2:30 PM<BR> Means of arrival:Ambulance<BR> Comments:<BR> Sinus infection/abscess

## 2011-09-29 NOTE — ED Notes (Signed)
Family at bedside. 

## 2011-09-29 NOTE — ED Notes (Signed)
Catherine, PA at bedside

## 2011-09-29 NOTE — ED Provider Notes (Signed)
Medical screening examination/treatment/procedure(s) were conducted as a shared visit with non-physician practitioner(s) and myself.  I personally evaluated the patient during the encounter Pt c/o right buttock pain and swelling for approx 4 d. She has diabetes and severe arthritis.  Takes meds that suppress immune system. Fever at home. Took vicodin and apap prior to coming to ed.   pe tachycardia, indurated, erythematous, tender right buttock.  Incr. Wbcs. Will scan pelvis. Give abxs.  Arrange for admission.     Cheri Guppy, MD 09/29/11 [phone]

## 2011-09-29 NOTE — Progress Notes (Addendum)
ANTIBIOTIC CONSULT NOTE - INITIAL  Pharmacy Consult for vancomycin Indication: cellulitis  Allergies  Allergen Reactions  . Demerol (Meperidine)   . Penicillins     Patient Measurements: Height: 4\' 11"  (149.9 cm) Weight: 121 lb 4.1 oz (55 kg) IBW/kg (Calculated) : 43.2  Adjusted Body Weight:   Vital Signs: Temp: 99.4 F (37.4 C) (06/16 2254) Temp src: Oral (06/16 2254) BP: 135/81 mmHg (06/16 2254) Pulse Rate: 101  (06/16 2254) Intake/Output from previous day:   Intake/Output from this shift: Total I/O In: -  Out: 150 [Urine:150]  Labs:  [facility] 09/29/11 1658  WBC 18.1*  HGB 8.1*  PLT 379  LABCREA --  CREATININE 1.05   Estimated Creatinine Clearance: 37.7 ml/min (by C-G formula based on Cr of 1.05). No results found for this basename: VANCOTROUGH:2,VANCOPEAK:2,VANCORANDOM:2,GENTTROUGH:2,GENTPEAK:2,GENTRANDOM:2,TOBRATROUGH:2,TOBRAPEAK:2,TOBRARND:2,AMIKACINPEAK:2,AMIKACINTROU:2,AMIKACIN:2, in the last 72 hours   Microbiology: No results found for this or any previous visit (from the past 720 hour(s)).  Medical History: Past Medical History  Diagnosis Date  . Diabetic ketoacidosis 11/2010  . Myocardial infarction     Chest pain s/p normal cath in 08/2004 then NSTEMI during 11/2010 admission, negative Myoview  . Diabetes mellitus type 2, insulin dependent   . GERD (gastroesophageal reflux disease)   . Esophageal stricture     Dilation 06/2004  . Hypertension   . Supraventricular tachycardia   . history of  pericarditis 12/2002  . Pericardial effusion   . Rheumatoid arthritis     On MTX and chronic steroids  . Osteoarthritis   . Asthma     Home 3L O2   . Seizure 11/2008  . Rectal bleeding 11/2010    Large hemorrhoids  . Chronic anemia     Medications:  Anti-infectives     Start     Dose/Rate Route Frequency Ordered Stop   09/30/11 1800   vancomycin (VANCOCIN) 500 mg in sodium chloride 0.9 % 100 mL IVPB        500 mg 100 mL/hr over 60 Minutes Intravenous  Every 24 hours 09/29/11 2343     09/29/11 1900   vancomycin (VANCOCIN) IVPB 1000 mg/200 mL premix        1,000 mg 200 mL/hr over 60 Minutes Intravenous  Once 09/29/11 1822 09/29/11 1935         Assessment: Patient with cellulitis. First dose of antibiotics already given in ED.  Goal of Therapy:  Vancomycin trough level 10-15 mcg/ml  Plan:  Measure antibiotic drug levels at steady state Follow up culture results Vancomycin 500mg  iv q24hr  Darlina Guys, Jacquenette Shone Crowford 09/29/2011,11:45 PM   ------------------------------------------------------------------------------------------------------------------------------------------------------------------------  Azactam per Pharmacy Dosing  A/P: Patient likely with abscess plus cellulitis to cover with broad spectrum antibiotics per pharmacy per discussion with Md. Note that patient has allergy of throat swelling to PCNs so will add Azactam and Flagyl to vancomycin. Start Azactam 1g IV q8. Will monitor renal function for changes   Hessie Knows, PharmD, BCPS Pager [phone] 09/30/2011 10:31 AM

## 2011-09-29 NOTE — ED Notes (Signed)
Per EMS: Pt from home, lives with husband. Reports boil to inside of right buttocks x 1 week and sinus infection. Pt was at pcp and received a z-pack approx 3 days ago, states does not feel any better.

## 2011-09-29 NOTE — H&P (Signed)
PCP:  Marisa Patch, MD   Confirmed PRIMARY CARDIOLOGIST:  Marisa [name]. Marisa Squibb, MD, [facility] RHEUMATOLOGIST:  Alben Deeds, MD GI: Claudette Head  Chief Complaint:  Buttock pain and fevers  HPI: 70yoF with h/o RA on steroids/leflunomide, DM/insulin, NSTEMI 11/2010 but  clean cath 08/2004, chronic anemia, asthma/COPD on home 2.5L [place], presents  with R buttock cellulitis, SIRS, hyperglycemic.   Pt was last admitted to [facility] 11/2010 for DKA, but during admission had  BRBPR, GI consulted, and noted to have large hemorrhoids, so just  monitored. Given 2u PRBC's, Hgb up to 11 from 8 on d/c. Also had positive  Trop, so cards consulted and had normal myoview, nothing further done.   Pt is reliable historian, and with husband they state she had a rash on  right buttock that's progressively gotten worse for the past week, and  eventually over past couple days pt spiked fevers to 101 at home,  prompting evaluation. There is no purulent drainage, although it did seem  to start with a small pimple, but nothing has drained through the week.   In the ED, Tmax 99.1, tachy to 115. HypoNa 130, hyperglycemic 310, WBC  18.1, Hgb 8.1 stable compared to 11/2010. BCx x2 pending. CT pelvis with  contrast showed phlegmon in right gluteal soft tissue, no abscess or gas.  Pt was given 1L of NS and vancomycin.     ROS as above, pt without acute cardiopulmonary symptoms, but with mild  nausea and diarrhea but not overwhelming. ROS otherwise negative/stable.    Past Medical History  Diagnosis Date  . Diabetic ketoacidosis 11/2010  . Myocardial infarction     Chest pain s/p normal cath in 08/2004 then NSTEMI during 11/2010 admission, negative Myoview  . Diabetes mellitus type 2, insulin dependent   . GERD (gastroesophageal reflux disease)   . Esophageal stricture     Dilation 06/2004  . Hypertension   . Supraventricular tachycardia   . history of  pericarditis 12/2002  . Pericardial effusion   . Rheumatoid arthritis      On MTX and chronic steroids  . Osteoarthritis   . Asthma     Home 3L O2   . Seizure 11/2008  . Rectal bleeding 11/2010    Large hemorrhoids  . Chronic anemia     Past Surgical History  Procedure Date  . Cholecystectomy   . Abdominal hysterectomy     Medications:  HOME MEDS: Reconciled. She is no longer taking methotrexate  Prior to Admission medications   Medication Sig Start Date End Date Taking? Authorizing Provider  budesonide (PULMICORT) 180 MCG/ACT inhaler Inhale 1 puff into the lungs.     Yes Historical Provider, MD  docusate sodium (COLACE) 100 MG capsule Take 100 mg by mouth 2 (two) times daily. Daily as needed    Yes Historical Provider, MD  esomeprazole (NEXIUM) 40 MG capsule Take 40 mg by mouth daily before breakfast. Take 1 capsule twice daily.    Yes Historical Provider, MD  estrogens, conjugated, (PREMARIN) 0.625 MG tablet Take 0.625 mg by mouth daily. Take daily for 21 days then do not take for 7 days.    Yes Historical Provider, MD  FeFum-FePoly-FA-B Cmp-C-Biot (INTEGRA PLUS) CAPS Take 1 capsule by mouth. Take 1 cap daily.    Yes Historical Provider, MD  ferrous fumarate-b12-vitamic C-folic acid (TRINSICON / FOLTRIN) capsule Take 1 capsule by mouth daily before breakfast.     Yes Historical Provider, MD  folic acid (FOLVITE) 1 MG tablet Take 1 mg  by mouth daily.     Yes Historical Provider, MD  glucosamine-chondroitin 500-400 MG tablet Take 1 tablet by mouth 3 (three) times daily.   Yes Historical Provider, MD  HYDROcodone-acetaminophen (VICODIN) 5-500 MG per tablet Take 1 tablet by mouth every 6 (six) hours as needed.     Yes Historical Provider, MD  hydrocortisone 2.5 % cream Apply topically 3 (three) times daily. Use rectally    Yes Historical Provider, MD  INSULIN ASPART [place] Inject 7-8 Units into the skin. Sliding scale as needed. Usually 7-8u with meals   Yes Historical Provider, MD  insulin detemir (LEVEMIR) 100 UNIT/ML injection Inject 20 Units into the skin  daily. 20U AT NOON   Yes Historical Provider, MD  leflunomide (ARAVA) 20 MG tablet Take 20 mg by mouth daily.   Yes Historical Provider, MD  levalbuterol [facility] HFA) 45 MCG/ACT inhaler Inhale 1-2 puffs into the lungs every 4 (four) hours as needed. 1-2 puffs every 6 hours as needed.    Yes Historical Provider, MD  lisinopril (PRINIVIL,ZESTRIL) 20 MG tablet Take 10 mg by mouth daily.    Yes Historical Provider, MD  LORazepam (ATIVAN) 1 MG tablet Take 1 mg by mouth every 8 (eight) hours. 1 in the am and 2 in the pm   Yes Historical Provider, MD  magnesium oxide (MAG-OX) 400 MG tablet Take 400 mg by mouth daily.   Yes Historical Provider, MD  metFORMIN (GLUCOPHAGE) 500 MG tablet Take 500 mg by mouth 2 (two) times daily with a meal.    Yes Historical Provider, MD  metoprolol (TOPROL XL) 100 MG 24 hr tablet Take 100 mg by mouth daily.     Yes Historical Provider, MD  Multiple Vitamin (MULTIVITAMINS PO) Take 1 tablet by mouth. Daily.    Yes Historical Provider, MD  potassium chloride SA (K-DUR,KLOR-CON) 20 MEQ tablet Take 20 mEq by mouth. Take 1 tab twice daily.    Yes Historical Provider, MD  predniSONE (DELTASONE) 10 MG tablet Take 10 mg by mouth daily.   Yes Historical Provider, MD  pregabalin (LYRICA) 75 MG capsule Take 75 mg by mouth 3 (three) times daily. 1 IN THE AM AND 2 IN THE PM   Yes Historical Provider, MD  traMADol (ULTRAM) 50 MG tablet Take 50 mg by mouth every 6 (six) hours as needed. Take 1 tab every 4 hours as needed for pain.    Yes Historical Provider, MD  rosuvastatin (CRESTOR) 10 MG tablet Take 10 mg by mouth daily.      Historical Provider, MD    Allergies:  Allergies  Allergen Reactions  . Demerol (Meperidine)   . Penicillins     Social History:   reports that she has never smoked. She does not have any smokeless tobacco history on file. She reports that she does not drink alcohol or use illicit drugs. Pt lives at home with her husband and has a son and daughter. She is  still ambulatory occasionally using a cane or walker. No toxic habits.   Family History: History reviewed. No pertinent family history.  Physical Exam: Filed Vitals:   09/29/11 1515 09/29/11 1641 09/29/11 1730 09/29/11 1946  BP:  104/72 134/60 167/66  Pulse: 109 109 97 115  Temp:  98.8 F (37.1 C)  98.8 F (37.1 C)  TempSrc:  Oral  Oral  Resp:  20  16  SpO2: 100% 100% 100% 100%   Blood pressure 167/66, pulse 115, temperature 98.8 F (37.1 C), temperature source Oral, resp.  rate 16, SpO2 100.00%.  Gen: Medium sized F in ED stretcher appears stable, not toxic, able to  relate history well, nice lady HEENT: Pupils round, even, and reactive, irises/scelra clear, mouth omist  and normal appearing, not dry Lungs: End inspiratory wheeze noted transiently on the right, but  otherwise clear, normal exam with good air movement, no cough, no  increased WOB Heart: Regular, tachy S1/2, early peaking systolic murmur noted  throughout, no heaves, no gallops Abd: Soft, not tender, distended, no grimacing, benign Extrem: Various light ecchymoses noted, warm, perfusing well, radials  palpable, no BLE edema noted Neuro: Alert, attentive, conversant, speech clear and fluent, moves  extremities on her own, grossly nonfocal.  Buttock: Right medial buttock with very hard, very erythemous, very  tender, large area of cellulitis. There is a small pinpoint pimple with  pus, but nothing able to be expressed, not draining pus either.    Labs & Imaging Results for orders placed during the hospital encounter of 09/29/11 (from the past 48 hour(s))  GLUCOSE, CAPILLARY     Status: Abnormal   Collection Time   09/29/11  2:39 PM      Component Value Range Comment   Glucose-Capillary 274 (*) 70 - 99 mg/dL    Comment 1 Notify RN      Comment 2 Documented in Chart     CBC     Status: Abnormal   Collection Time   09/29/11  4:58 PM      Component Value Range Comment   WBC 18.1 (*) 4.0 - 10.5 K/uL    RBC  2.92 (*) 3.87 - 5.11 MIL/uL    Hemoglobin 8.1 (*) 12.0 - 15.0 g/dL    HCT 40.9 (*) 81.1 - 46.0 %    MCV 88.7  78.0 - 100.0 fL    MCH 27.7  26.0 - 34.0 pg    MCHC 31.3  30.0 - 36.0 g/dL    RDW 91.4  78.2 - 95.6 %    Platelets 379  150 - 400 K/uL   DIFFERENTIAL     Status: Abnormal   Collection Time   09/29/11  4:58 PM      Component Value Range Comment   Neutrophils Relative 83 (*) 43 - 77 %    Neutro Abs 15.1 (*) 1.7 - 7.7 K/uL    Lymphocytes Relative 6 (*) 12 - 46 %    Lymphs Abs 1.0  0.7 - 4.0 K/uL    Monocytes Relative 10  3 - 12 %    Monocytes Absolute 1.8 (*) 0.1 - 1.0 K/uL    Eosinophils Relative 1  0 - 5 %    Eosinophils Absolute 0.1  0.0 - 0.7 K/uL    Basophils Relative 0  0 - 1 %    Basophils Absolute 0.0  0.0 - 0.1 K/uL   BASIC METABOLIC PANEL     Status: Abnormal   Collection Time   09/29/11  4:58 PM      Component Value Range Comment   Sodium 130 (*) 135 - 145 mEq/L    Potassium 4.6  3.5 - 5.1 mEq/L    Chloride 95 (*) 96 - 112 mEq/L    CO2 25  19 - 32 mEq/L    Glucose, Bld 310 (*) 70 - 99 mg/dL    BUN 13  6 - 23 mg/dL    Creatinine, Ser 2.13  0.50 - 1.10 mg/dL    Calcium 8.5  8.4 - 08.6 mg/dL  GFR calc non Af Amer 53 (*) >90 mL/min    GFR calc Af Amer 61 (*) >90 mL/min    Ct Pelvis W Contrast  09/29/2011  *RADIOLOGY REPORT*  Clinical Data:  Soft tissue infection in the right gluteal region.  CT PELVIS WITH CONTRAST  Technique:  Multidetector CT imaging of the pelvis was performed using the standard protocol following the bolus administration of intravenous contrast.  Contrast: OMNIPAQUE IOHEXOL 300 MG/ML  SOLN  Comparison:  11/26/2010 MRI.  Findings:  Marked inflammatory changes are present in the dorsal right gluteal subcutaneous fat.  There is no well-defined abscess. No gas is present in the soft tissues.  Inflammatory changes extend up to the right side of the gluteal cleft.  Hysterectomy.  Visceral pelvis grossly appears within normal limits aside from a  cystic lesion in the right anatomic pelvis, compatible with a small right ovarian cyst.  There is no inguinal adenopathy.  No iliac adenopathy.  No destructive osseous lesions are present.  Lumbar spondylosis at L4-L5 and L5-S1 is severe.  Bilateral obturator ring nonunion is present.  IMPRESSION:  1.  Phlegmon in the right gluteal soft tissues without abscess or gas.  Findings compatible with superficial soft tissue infection. No extension of the deep pelvic tissues. 2.  Bilateral obturator ring nonunion.  Original Report Authenticated By: Andreas Newport, M.D.    Impression Present on Admission:  .Cellulitis of buttock, right .SIRS (systemic inflammatory response syndrome) .Hyponatremia .Tachycardia .COPD .Diabetes mellitus type 2, insulin dependent .Chronic anemia .HYPERTENSION   70yoF with h/o RA on steroids/leflunomide, DM/insulin, NSTEMI 11/2010 but  clean cath 08/2004, chronic anemia, asthma/COPD on home 2.5L [place], presents  with R buttock cellulitis, SIRS, hyperglycemic.   1. Cellulitis of R buttock: With SIRS but not sepsis, and not apparently  purulent. She is somewhat immunocompromised given daily prednisone, RA,  and presently uncontrolled DM. No clear RF's for MRSA, but given the  above, reasonable to give Vanc and screen for MRSA, and if negative can  likely downtitrate to MSSA coverage when switching to oral.   2. Tachycardia and HypoNa: Mild/moderate Na at 130. Likely pre- renal/hypovolemia in setting of infxn.  - IVF's and trend.    3. Diabetes, hyperglycemia: No present evidence of DKA. Likely less  controlled at present due to #1.  - Get UA to assess for ketones. Continue levemir but give more aggressive  SSI for now - Holding metformin, got contrast   4. Anemia: Pt's Hgb dropped to 8's during 11/2010 admission, was 11 on d/c  then, and now back to 8. No apparent blood loss, likely some element of  chronic disease from DM and RA. Not on history to have chronic  anemia.  - As discussed with pt, likely nothing to do inpt, would monitor and  defer to outpt w/u. States had colonoscopy 2006 or 2008.   5. RA: Continue leflunomide, prednisone, folate,   6. COPD/astham: On home 2.5L, no current issue, continue oxygen, inhaled  steroid,   7. H/o HTN: BP's stable despite infxn, continue home BP meds.   8. Continue premarin, ativan, lyrica, tramadol. Holding all other various  non essential meds.   Lovenox prophy  Regular bed, [facility] team 6 Presumed full code   Marisa [name] 09/29/2011, 10:24 PM

## 2011-09-29 NOTE — ED Provider Notes (Signed)
History     CSN: [id number]  Arrival date & time 09/29/11  1433   First MD Initiated Contact with Patient 09/29/11 1605      Chief Complaint  Patient presents with  . Recurrent Skin Infections  . Recurrent Sinusitis    (Consider location/radiation/quality/duration/timing/severity/associated sxs/prior treatment) HPI Hx from pt. 71 yo F with hx DM who presents with c/o skin infection. States she's had a "boil" to the inner right buttock for about the past week which has been progressively increasing in size and has become gradually more painful since it first appeared. She does not have a hx of the same. She reports that she and her husband have been keeping the area clean and dry with frequent dressing changes since the area first began. There has been no drainage from the area. Pt does report that she's been febrile at home with Tmax 101 for the last 2 nights. Of note, pt is on a disease modifying antirheumatic for her RA.  Pt also states that she is currently being treated for sinusitis - she saw her PCP and is currently on day 3 of a z-pack. Has had some persistent nasal drainage but states her facial pain has improved. Denies cough, congestion. She states "I didn't want to show my doctor the boil when I was there."  Past Medical History  Diagnosis Date  . Diabetic ketoacidosis   . Myocardial infarction     Non-ST-segment  . Diabetes mellitus type 2, insulin dependent   . GERD (gastroesophageal reflux disease)   . Esophageal stricture   . Hypertension   . Supraventricular tachycardia   . history of  pericarditis   . Pericardial effusion   . Rheumatoid arthritis   . Osteoarthritis   . Asthma     History reviewed. No pertinent past surgical history.  History reviewed. No pertinent family history.  History  Substance Use Topics  . Smoking status: Never Smoker   . Smokeless tobacco: Not on file  . Alcohol Use: No    OB History    Grav Para Term Preterm Abortions TAB  SAB Ect Mult Living                  Review of Systems  Constitutional: Positive for fever. Negative for chills, activity change and appetite change.  HENT: Positive for congestion and sinus pressure. Negative for facial swelling.   Respiratory: Negative for cough and shortness of breath.   Cardiovascular: Negative for chest pain.  Gastrointestinal: Negative for nausea, vomiting and abdominal pain.  Musculoskeletal: Negative for myalgias.  Skin: Positive for wound. Negative for color change.  Neurological: Negative for dizziness, weakness and headaches.    Allergies  Demerol and Penicillins  Home Medications   Current Outpatient Rx  Name Route Sig Dispense Refill  . BUDESONIDE 180 MCG/ACT IN AEPB Inhalation Inhale 1 puff into the lungs.      . DOCUSATE SODIUM 100 MG PO CAPS Oral Take 100 mg by mouth 2 (two) times daily. Daily as needed     . ESOMEPRAZOLE MAGNESIUM 40 MG PO CPDR Oral Take 40 mg by mouth daily before breakfast. Take 1 capsule twice daily.     Marland Kitchen ESTROGENS CONJUGATED 0.625 MG PO TABS Oral Take 0.625 mg by mouth daily. Take daily for 21 days then do not take for 7 days.     Arnette Schaumann PLUS PO CAPS Oral Take 1 capsule by mouth. Take 1 cap daily.     . FE FUMARATE-B12-VIT C-FA-IFC  PO CAPS Oral Take 1 capsule by mouth daily before breakfast.      . FOLIC ACID 1 MG PO TABS Oral Take 1 mg by mouth daily.      Marland Kitchen HYDROCODONE-ACETAMINOPHEN 5-500 MG PO TABS Oral Take 1 tablet by mouth every 6 (six) hours as needed.      Marland Kitchen HYDROCORTISONE 2.5 % EX CREA Topical Apply topically 3 (three) times daily. Use rectally     . INSULIN ASPART Coaling Subcutaneous Inject into the skin. Sliding scale as needed.     . INSULIN DETEMIR 100 UNIT/ML  SOLN Subcutaneous Inject into the skin at bedtime. 18 units Subcu nightly     . LEVALBUTEROL TARTRATE 45 MCG/ACT IN AERO Inhalation Inhale 1-2 puffs into the lungs every 4 (four) hours as needed. 1-2 puffs every 6 hours as needed.     Marland Kitchen LISINOPRIL 20 MG PO  TABS Oral Take 20 mg by mouth daily.      Marland Kitchen LORAZEPAM 1 MG PO TABS Oral Take 1 mg by mouth every 8 (eight) hours. Take 1 tab 3 times daily.     Marland Kitchen METFORMIN HCL 500 MG PO TABS Oral Take 500 mg by mouth.      . METOPROLOL SUCCINATE ER 100 MG PO TB24 Oral Take 100 mg by mouth daily.      . MULTIVITAMINS PO Oral Take 1 tablet by mouth. Daily.     Marland Kitchen POTASSIUM CHLORIDE CRYS ER 20 MEQ PO TBCR Oral Take 20 mEq by mouth. Take 1 tab twice daily.     Marland Kitchen PREGABALIN 75 MG PO CAPS Oral Take 75 mg by mouth 3 (three) times daily.      Marland Kitchen ROSUVASTATIN CALCIUM 10 MG PO TABS Oral Take 10 mg by mouth daily.      . TRAMADOL HCL 50 MG PO TABS Oral Take 50 mg by mouth every 6 (six) hours as needed. Take 1 tab every 4 hours as needed for pain.       BP 104/72  Pulse 109  Temp 98.8 F (37.1 C) (Oral)  Resp 20  SpO2 100%  Physical Exam  Nursing note and vitals reviewed. Constitutional: She appears well-developed and well-nourished. No distress.  HENT:  Head: Normocephalic and atraumatic.  Eyes:       Normal appearance  Neck: Normal range of motion.  Cardiovascular: Normal rate, regular rhythm and normal heart sounds.   Pulmonary/Chest: Effort normal and breath sounds normal. She exhibits no tenderness.  Abdominal: Soft. Bowel sounds are normal. There is no tenderness. There is no rebound and no guarding.  Genitourinary:       Large area of induration and erythema to R medial buttock measuring approx 8x4 cm. No drainage noted from the area. No area of definite fluctuance that appears amenable to drainage. Area exquisitely tender to palp.  Musculoskeletal: Normal range of motion.  Neurological: She is alert.  Skin: Skin is warm and dry. She is not diaphoretic.  Psychiatric: She has a normal mood and affect.    ED Course  Procedures (including critical care time)  Labs Reviewed  GLUCOSE, CAPILLARY - Abnormal; Notable for the following:    Glucose-Capillary 274 (*)     All other components within normal  limits  CBC - Abnormal; Notable for the following:    WBC 18.1 (*)     RBC 2.92 (*)     Hemoglobin 8.1 (*)     HCT 25.9 (*)     All other components within normal  limits  DIFFERENTIAL - Abnormal; Notable for the following:    Neutrophils Relative 83 (*)     Neutro Abs 15.1 (*)     Lymphocytes Relative 6 (*)     Monocytes Absolute 1.8 (*)     All other components within normal limits  BASIC METABOLIC PANEL - Abnormal; Notable for the following:    Sodium 130 (*)     Chloride 95 (*)     Glucose, Bld 310 (*)     GFR calc non Af Amer 53 (*)     GFR calc Af Amer 61 (*)     All other components within normal limits   Ct Pelvis W Contrast  09/29/2011  *RADIOLOGY REPORT*  Clinical Data:  Soft tissue infection in the right gluteal region.  CT PELVIS WITH CONTRAST  Technique:  Multidetector CT imaging of the pelvis was performed using the standard protocol following the bolus administration of intravenous contrast.  Contrast: OMNIPAQUE IOHEXOL 300 MG/ML  SOLN  Comparison:  11/26/2010 MRI.  Findings:  Marked inflammatory changes are present in the dorsal right gluteal subcutaneous fat.  There is no well-defined abscess. No gas is present in the soft tissues.  Inflammatory changes extend up to the right side of the gluteal cleft.  Hysterectomy.  Visceral pelvis grossly appears within normal limits aside from a cystic lesion in the right anatomic pelvis, compatible with a small right ovarian cyst.  There is no inguinal adenopathy.  No iliac adenopathy.  No destructive osseous lesions are present.  Lumbar spondylosis at L4-L5 and L5-S1 is severe.  Bilateral obturator ring nonunion is present.  IMPRESSION:  1.  Phlegmon in the right gluteal soft tissues without abscess or gas.  Findings compatible with superficial soft tissue infection. No extension of the deep pelvic tissues. 2.  Bilateral obturator ring nonunion.  Original Report Authenticated By: Andreas Newport, M.D.     1. Cellulitis of right  buttock       MDM  Pt with risk factors for immunocompromise including DM and DMARD therapy presents with cellulitis to R buttock which has been progressing x 1 week. Reports fever at home. She has been persistently mildly tachycardic while in dept, ~110 rate, and has a leukocytosis to 18k. CT pelvis shows superficial soft tissue infx without abscess or gas. There does not appear to be an area readily amenable to drainage. Given this, will plan to admit pt for IV abx and further treatment. IV vanc dose given in dept. Discussed with hospitalist, Dr. Kaylyn Layer, at 2115 who accepts pt for admission to med-surg bed.        Grant Fontana, PA-C 09/29/11 2123

## 2011-09-30 ENCOUNTER — Other Ambulatory Visit: Payer: Self-pay

## 2011-09-30 ENCOUNTER — Encounter (HOSPITAL_COMMUNITY): Payer: Self-pay | Admitting: Anesthesiology

## 2011-09-30 ENCOUNTER — Inpatient Hospital Stay (HOSPITAL_COMMUNITY): Payer: Medicare Other | Admitting: Anesthesiology

## 2011-09-30 ENCOUNTER — Encounter (HOSPITAL_COMMUNITY): Payer: Self-pay

## 2011-09-30 ENCOUNTER — Encounter (HOSPITAL_COMMUNITY)
Admission: EM | Disposition: A | Discharge status: Nursing Home (Includes ICF) | Payer: Self-pay | Source: Home / Self Care | Attending: Internal Medicine

## 2011-09-30 DIAGNOSIS — M069 Rheumatoid arthritis, unspecified: Secondary | ICD-10-CM

## 2011-09-30 DIAGNOSIS — E1165 Type 2 diabetes mellitus with hyperglycemia: Secondary | ICD-10-CM

## 2011-09-30 DIAGNOSIS — L0231 Cutaneous abscess of buttock: Secondary | ICD-10-CM

## 2011-09-30 DIAGNOSIS — IMO0001 Reserved for inherently not codable concepts without codable children: Secondary | ICD-10-CM

## 2011-09-30 DIAGNOSIS — R6510 Systemic inflammatory response syndrome (SIRS) of non-infectious origin without acute organ dysfunction: Secondary | ICD-10-CM

## 2011-09-30 DIAGNOSIS — K612 Anorectal abscess: Secondary | ICD-10-CM

## 2011-09-30 DIAGNOSIS — L03317 Cellulitis of buttock: Secondary | ICD-10-CM

## 2011-09-30 LAB — URINE MICROSCOPIC-ADD ON

## 2011-09-30 LAB — URINALYSIS, ROUTINE W REFLEX MICROSCOPIC
Glucose, UA: >1000 mg/dL — AB
Protein, ur: NEGATIVE mg/dL
Specific Gravity, Urine: 1.032 — ABNORMAL HIGH (ref 1.005–1.030)
Urobilinogen, UA: 0.2 mg/dL (ref 0.0–1.0)

## 2011-09-30 LAB — CARDIAC PANEL(CRET KIN+CKTOT+MB+TROPI)
Relative Index: INVALID (ref 0.0–2.5)
Total CK: 25 U/L (ref 7–177)

## 2011-09-30 LAB — CBC
Hemoglobin: 8.4 g/dL — ABNORMAL LOW (ref 12.0–15.0)
MCHC: 31.3 g/dL (ref 30.0–36.0)
RDW: 13.4 % (ref 11.5–15.5)
WBC: 19.9 10*3/uL — ABNORMAL HIGH (ref 4.0–10.5)

## 2011-09-30 LAB — MRSA PCR SCREENING: MRSA by PCR: POSITIVE — AB

## 2011-09-30 LAB — PROCALCITONIN: Procalcitonin: 29.73 ng/mL

## 2011-09-30 LAB — BASIC METABOLIC PANEL
BUN: 13 mg/dL (ref 6–23)
Chloride: 95 mEq/L — ABNORMAL LOW (ref 96–112)
GFR calc Af Amer: 60 mL/min — ABNORMAL LOW (ref >90)
GFR calc non Af Amer: 51 mL/min — ABNORMAL LOW (ref >90)
Potassium: 4.7 mEq/L (ref 3.5–5.1)

## 2011-09-30 LAB — GLUCOSE, CAPILLARY
Glucose-Capillary: 392 mg/dL — ABNORMAL HIGH (ref 70–99)
Glucose-Capillary: 233 mg/dL — ABNORMAL HIGH (ref 70–99)
Glucose-Capillary: 169 mg/dL — ABNORMAL HIGH (ref 70–99)

## 2011-09-30 SURGERY — INCISION AND DRAINAGE, ABSCESS
Anesthesia: General | Site: Buttocks | Laterality: Right | Wound class: Dirty or Infected

## 2011-09-30 MED ORDER — METRONIDAZOLE IN NACL 5-0.79 MG/ML-% IV SOLN
500.0000 mg | Freq: Three times a day (TID) | INTRAVENOUS | Status: DC
  Administered 2011-09-30 – 2011-10-03 (×9): 500 mg via INTRAVENOUS
  Filled 2011-09-30 (×10): qty 100

## 2011-09-30 MED ORDER — DEXTROSE 5 % IV SOLN
1.0000 g | Freq: Three times a day (TID) | INTRAVENOUS | Status: DC
  Administered 2011-09-30 – 2011-10-03 (×9): 1 g via INTRAVENOUS
  Filled 2011-09-30 (×10): qty 1

## 2011-09-30 MED ORDER — CHLORHEXIDINE GLUCONATE CLOTH 2 % EX PADS
6.0000 | MEDICATED_PAD | Freq: Every day | CUTANEOUS | Status: AC
  Administered 2011-09-30 – 2011-10-04 (×5): 6 via TOPICAL

## 2011-09-30 MED ORDER — MORPHINE SULFATE 2 MG/ML IJ SOLN
1.0000 mg | INTRAMUSCULAR | Status: DC | PRN
  Administered 2011-09-30: 2 mg via INTRAVENOUS
  Filled 2011-09-30 (×2): qty 1

## 2011-09-30 MED ORDER — MUPIROCIN 2 % EX OINT
1.0000 "application " | TOPICAL_OINTMENT | Freq: Two times a day (BID) | CUTANEOUS | Status: AC
  Administered 2011-09-30 – 2011-10-04 (×10): 1 via NASAL
  Filled 2011-09-30: qty 22

## 2011-09-30 MED ORDER — PROPOFOL 10 MG/ML IV EMUL
INTRAVENOUS | Status: DC | PRN
  Administered 2011-09-30: 70 mg via INTRAVENOUS

## 2011-09-30 MED ORDER — INSULIN DETEMIR 100 UNIT/ML ~~LOC~~ SOLN
15.0000 [IU] | Freq: Every day | SUBCUTANEOUS | Status: DC
  Administered 2011-10-01 – 2011-10-02 (×2): 15 [IU] via SUBCUTANEOUS

## 2011-09-30 MED ORDER — SODIUM CHLORIDE 0.9 % IV SOLN
INTRAVENOUS | Status: DC | PRN
  Administered 2011-09-30: 19:00:00 via INTRAVENOUS

## 2011-09-30 MED ORDER — ONDANSETRON HCL 4 MG/2ML IJ SOLN
INTRAMUSCULAR | Status: DC | PRN
  Administered 2011-09-30: 4 mg via INTRAVENOUS

## 2011-09-30 MED ORDER — FENTANYL CITRATE 0.05 MG/ML IJ SOLN
INTRAMUSCULAR | Status: DC | PRN
  Administered 2011-09-30 (×4): 25 ug via INTRAVENOUS

## 2011-09-30 MED ORDER — SUCCINYLCHOLINE CHLORIDE 20 MG/ML IJ SOLN
INTRAMUSCULAR | Status: DC | PRN
  Administered 2011-09-30: 60 mg via INTRAVENOUS

## 2011-09-30 MED ORDER — 0.9 % SODIUM CHLORIDE (POUR BTL) OPTIME
TOPICAL | Status: DC | PRN
  Administered 2011-09-30: 1000 mL

## 2011-09-30 MED ORDER — INSULIN ASPART 100 UNIT/ML ~~LOC~~ SOLN
SUBCUTANEOUS | Status: AC
  Filled 2011-09-30: qty 1

## 2011-09-30 MED ORDER — ONDANSETRON HCL 4 MG/2ML IJ SOLN
INTRAMUSCULAR | Status: AC
  Filled 2011-09-30: qty 2

## 2011-09-30 MED ORDER — PROMETHAZINE HCL 25 MG/ML IJ SOLN
12.5000 mg | INTRAMUSCULAR | Status: DC | PRN
  Administered 2011-09-30: 12.5 mg via INTRAVENOUS

## 2011-09-30 MED ORDER — INSULIN ASPART 100 UNIT/ML ~~LOC~~ SOLN
0.0000 [IU] | SUBCUTANEOUS | Status: DC
  Administered 2011-09-30 – 2011-10-01 (×5): 3 [IU] via SUBCUTANEOUS
  Administered 2011-10-01: 2 [IU] via SUBCUTANEOUS
  Administered 2011-10-03: 15 [IU] via SUBCUTANEOUS
  Administered 2011-10-03: 11 [IU] via SUBCUTANEOUS
  Administered 2011-10-03 (×2): 2 [IU] via SUBCUTANEOUS
  Administered 2011-10-04: 11 [IU] via SUBCUTANEOUS
  Administered 2011-10-04: 5 [IU] via SUBCUTANEOUS
  Administered 2011-10-04: 09:00:00 via SUBCUTANEOUS
  Administered 2011-10-05: 5 [IU] via SUBCUTANEOUS
  Administered 2011-10-05: 8 [IU] via SUBCUTANEOUS

## 2011-09-30 MED ORDER — FENTANYL CITRATE 0.05 MG/ML IJ SOLN
25.0000 ug | INTRAMUSCULAR | Status: DC | PRN
  Administered 2011-10-01: 50 ug via INTRAVENOUS
  Filled 2011-09-30: qty 2

## 2011-09-30 MED ORDER — HYDROMORPHONE HCL PF 1 MG/ML IJ SOLN
INTRAMUSCULAR | Status: DC | PRN
  Administered 2011-09-30 (×2): 1 mg via INTRAVENOUS

## 2011-09-30 MED ORDER — METOPROLOL TARTRATE 1 MG/ML IV SOLN
2.5000 mg | Freq: Four times a day (QID) | INTRAVENOUS | Status: DC | PRN
  Administered 2011-10-02: 5 mg via INTRAVENOUS
  Filled 2011-09-30: qty 5

## 2011-09-30 MED ORDER — PROMETHAZINE HCL 25 MG/ML IJ SOLN
6.2500 mg | INTRAMUSCULAR | Status: DC | PRN
  Filled 2011-09-30: qty 1

## 2011-09-30 SURGICAL SUPPLY — 38 items
BANDAGE GAUZE ELAST BULKY 4 IN (GAUZE/BANDAGES/DRESSINGS) ×1 IMPLANT
BLADE SURG 15 STRL LF DISP TIS (BLADE) ×1 IMPLANT
BLADE SURG 15 STRL SS (BLADE) ×2
BRIEF STRETCH FOR OB PAD LRG (UNDERPADS AND DIAPERS) ×1 IMPLANT
CANISTER SUCTION 2500CC (MISCELLANEOUS) ×2 IMPLANT
CLOTH BEACON ORANGE TIMEOUT ST (SAFETY) ×2 IMPLANT
CONT SPECI 4OZ STER CLIK (MISCELLANEOUS) ×1 IMPLANT
COVER SURGICAL LIGHT HANDLE (MISCELLANEOUS) ×2 IMPLANT
DECANTER SPIKE VIAL GLASS SM (MISCELLANEOUS) IMPLANT
DRAPE LAPAROSCOPIC ABDOMINAL (DRAPES) ×1 IMPLANT
DRSG PAD ABDOMINAL 8X10 ST (GAUZE/BANDAGES/DRESSINGS) ×1 IMPLANT
ELECT CAUTERY BLADE 6.4 (BLADE) ×2 IMPLANT
ELECT REM PT RETURN 9FT ADLT (ELECTROSURGICAL) ×2
ELECTRODE REM PT RTRN 9FT ADLT (ELECTROSURGICAL) ×1 IMPLANT
GLOVE BIO SURGEON STRL SZ7.5 (GLOVE) ×1 IMPLANT
GLOVE BIOGEL PI IND STRL 7.0 (GLOVE) IMPLANT
GLOVE BIOGEL PI INDICATOR 7.0 (GLOVE) ×1
GOWN BRE IMP SLV AUR XL STRL (GOWN DISPOSABLE) ×2 IMPLANT
GOWN STRL NON-REIN LRG LVL3 (GOWN DISPOSABLE) ×2 IMPLANT
KIT BASIN OR (CUSTOM PROCEDURE TRAY) ×2 IMPLANT
NDL HYPO 25X1 1.5 SAFETY (NEEDLE) IMPLANT
NEEDLE HYPO 25X1 1.5 SAFETY (NEEDLE) IMPLANT
NS IRRIG 1000ML POUR BTL (IV SOLUTION) ×2 IMPLANT
PACK BASIC VI WITH GOWN DISP (CUSTOM PROCEDURE TRAY) ×1 IMPLANT
PENCIL BUTTON HOLSTER BLD 10FT (ELECTRODE) ×2 IMPLANT
SPONGE GAUZE 4X4 12PLY (GAUZE/BANDAGES/DRESSINGS) IMPLANT
SPONGE LAP 18X18 X RAY DECT (DISPOSABLE) ×2 IMPLANT
SUT MNCRL AB 4-0 PS2 18 (SUTURE) IMPLANT
SUT VIC AB 3-0 SH 27 (SUTURE)
SUT VIC AB 3-0 SH 27XBRD (SUTURE) IMPLANT
SWAB COLLECTION DEVICE MRSA (MISCELLANEOUS) IMPLANT
SYR BULB 3OZ (MISCELLANEOUS) ×1 IMPLANT
SYR CONTROL 10ML LL (SYRINGE) IMPLANT
TAPE CLOTH SURG 6X10 WHT LF (GAUZE/BANDAGES/DRESSINGS) ×1 IMPLANT
TOWEL OR 17X26 10 PK STRL BLUE (TOWEL DISPOSABLE) ×2 IMPLANT
TUBE ANAEROBIC SPECIMEN COL (MISCELLANEOUS) ×2 IMPLANT
WATER STERILE IRR 1000ML POUR (IV SOLUTION) IMPLANT
YANKAUER SUCT BULB TIP NO VENT (SUCTIONS) ×2 IMPLANT

## 2011-09-30 NOTE — Op Note (Signed)
Operative Note  Marisa [name] female 71 y.o. 09/30/2011  PREOPERATIVE DX:  Right buttock abscess  POSTOPERATIVE DX:  Complex right perirectal abscess  PROCEDURE:  Complex incision, drainage, and debridement of large right perirectal abscess         Surgeon: Adolph Pollack   Assistants: None  Anesthesia: General endotracheal anesthesia  Indications:   This is a 71 year old diabetic female on steroids who came in with right buttock and perianal cellulitis. She has been started on broad-spectrum antibiotics. Physical exam is consistent with an abscess. She is now brought to the operating room for the above procedure.    Procedure Detail:  She was brought to the operating room supine on a stretcher and general anesthetic was administered. She was moved over to the operating room table and turned so that her left side was down in the right side was up. Appropriate sites were padded and she was secured. The right perianal area and buttock was sterilely prepped and draped.  I located the area of fluctuance in the right buttock/perianal area and made a small incision here and purulent fluid under pressure was evacuated and sent for culture. I enlarged this incision and noticed the abscess to be tracking to the perianal area anteriorly and up the right buttock area posteriorly. I excised a full thickness elliptical portion of skin and subcutaneous tissue. I then broke up loculations digitally. I debrided necrotic appearing tissue with electrocautery.  I then thoroughly explored this which measures about 13-15 cm in length and 5 cm in width. No further areas of loculation were noted.  Bleeding from areas of the wound was controlled with electrocautery. Once hemostasis was adequate, the wound was packed tightly with saline moistened gauze followed by bulky dry dressing.  She tolerated the procedure without any apparent complications and was taken to the recovery room in satisfactory  condition.    Estimated Blood Loss:  less than 100 mL         Drains: none          Blood Given: none          Specimens: Abscess fluid for culture. Abscess tissue to pathology.        Complications:  * No complications entered in OR log *         Disposition: PACU - hemodynamically stable.         Condition: stable

## 2011-09-30 NOTE — Plan of Care (Signed)
Problem: Phase II Progression Outcomes Goal: Progress activity as tolerated unless otherwise ordered Outcome: Not Progressing Pt received from floor, HR 140-150's,  remains on bedrest today.  Pt went ot OR at 1900 for I and D of Right buttock abscess. Goal: Temperature < 101 Outcome: Progressing Temp foley placed to monitor temperature closely.

## 2011-09-30 NOTE — Transfer of Care (Signed)
Immediate Anesthesia Transfer of Care Note  Patient: Marisa [name]  Procedure(s) Performed: Procedure(s) (LRB): INCISION AND DRAINAGE ABSCESS (Right)  Patient Location: PACU  Anesthesia Type: General  Level of Consciousness: awake and alert   Airway & Oxygen Therapy: Patient Spontanous Breathing and Patient connected to face mask oxygen  Post-op Assessment: Report given to PACU RN and Post -op Vital signs reviewed and stable  Post vital signs: Reviewed and stable  Complications: No apparent anesthesia complications

## 2011-09-30 NOTE — ED Provider Notes (Signed)
Medical screening examination/treatment/procedure(s) were conducted as a shared visit with non-physician practitioner(s) and myself.  I personally evaluated the patient during the encounter  Cheri Guppy, MD 09/30/11 [phone]

## 2011-09-30 NOTE — Anesthesia Procedure Notes (Signed)
Procedure Name: Intubation Date/Time: 09/30/2011 7:55 PM Performed by: Leroy Libman L Patient Re-evaluated:Patient Re-evaluated prior to inductionOxygen Delivery Method: Circle system utilized Preoxygenation: Pre-oxygenation with 100% oxygen Intubation Type: IV induction Ventilation: Mask ventilation without difficulty and Oral airway inserted - appropriate to patient size Laryngoscope Size: Miller and 2 Grade View: Grade I Tube size: 7.0 mm Number of attempts: 1 Airway Equipment and Method: Stylet Placement Confirmation: ETT inserted through vocal cords under direct vision,  breath sounds checked- equal and bilateral and positive ETCO2 Secured at: 22 cm Tube secured with: Tape Dental Injury: Teeth and Oropharynx as per pre-operative assessment

## 2011-09-30 NOTE — Anesthesia Postprocedure Evaluation (Signed)
  Anesthesia Post-op Note  Patient: [name]  Procedure(s) Performed: Procedure(s) (LRB): INCISION AND DRAINAGE ABSCESS (Right)  Patient Location: PACU  Anesthesia Type: General  Level of Consciousness: awake and alert   Airway and Oxygen Therapy: Patient Spontanous Breathing  Post-op Pain: mild  Post-op Assessment: Post-op Vital signs reviewed, Patient's Cardiovascular Status Stable, Respiratory Function Stable, Patent Airway and No signs of Nausea or vomiting  Post-op Vital Signs: stable  Complications: No apparent anesthesia complications

## 2011-09-30 NOTE — Patient Care Conference (Signed)
Transferred to 1241. Shelia RN made aware of NPO status while awaiting surgical consult & also of positive MRSA PCR.Hartley Barefoot

## 2011-09-30 NOTE — Preoperative (Signed)
Beta Blockers   Reason not to administer Beta Blockers:Hold beta blocker due to other 

## 2011-09-30 NOTE — Progress Notes (Signed)
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 Obstructive sleep apnea-on 3 L of home O2 ? 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 Able to awaken to converse, that states she is still feeling unwell although little better than yesterday States some mild chest pain in the center of her chest No cough no cold no fever. Buttocks is significantly painful   Objective    Interim History: Reviewed all records and all  Subjective: Nursing reports 1-2 episodes of slightly dark stool which may of been frank blood. Patient was having some chest pain an EKG was done last night.  Objective: Filed Vitals:   09/29/11 2254  09/30/11 0256 09/30/11 0630 09/30/11 0844  BP: 135/81 137/79 91/53   Pulse: 101 150 129   Temp: 99.4 F (37.4 C) 100.7 F (38.2 C) 99.1 F (37.3 C)   TempSrc: Oral Oral Oral   Resp: 20 22 20    Height: 4\' 11"  (1.499 m)     Weight: 55 kg (121 lb 4.1 oz)     SpO2: 99%  99% 100%    Intake/Output Summary (Last 24 hours) at 09/30/11 0946 Last data filed at 09/30/11 0444  Gross per 24 hour  Intake      0 ml  Output    575 ml  Net   -575 ml    Exam:  General: Ill-appearing Caucasian female, some mild cardiorespiratory distress Cardiovascular: S1-S2 no murmur rub or gallop-patient on telemetry but tachycardic to my exam Respiratory: Clinically clear Abdomen: Soft nontender-cellulitic changes to the right buttocks with pointing area of her pimple. Her erythema seems to be somewhat contained.  Rectal exam deferred Skin see above-has rheumatoid changes and gnarled knuckles Neuro grossly intact, but sleepy and slightly lethargic-usually at baseline per nursing patient is able to ambulate and do activities with a cane  Data Reviewed: Basic Metabolic Panel:  Lab 09/30/11 2706 09/29/11 1658  NA 130* 130*  K 4.7 4.6  CL 95* 95*  CO2 21 25  GLUCOSE 243* 310*  BUN 13 13  CREATININE 1.07 1.05  CALCIUM 8.6 8.5  MG -- --  PHOS -- --   Liver Function Tests: No results found for this basename: AST:5,ALT:5,ALKPHOS:5,BILITOT:5,PROT:5,ALBUMIN:5 in the last 168 hours No results found for this basename: LIPASE:5,AMYLASE:5 in the last 168 hours No results found for this basename: AMMONIA:5 in the last 168 hours CBC:  Lab 09/30/11 0351 09/29/11 1658  WBC 19.9* 18.1*  NEUTROABS -- 15.1*  HGB 8.4* 8.1*  HCT  26.8* 25.9*  MCV 88.7 88.7  PLT 417* 379   Cardiac Enzymes: No results found for this basename: CKTOTAL:5,CKMB:5,CKMBINDEX:5,TROPONINI:5 in the last 168 hours BNP: No components found with this basename: POCBNP:5 CBG:  Lab 09/30/11 0740 09/30/11 0125 09/29/11 2331 09/29/11 1439    GLUCAP 323* 392* 483* 274*    Recent Results (from the past 240 hour(s))  CULTURE, BLOOD (ROUTINE X 2)     Status: Normal (Preliminary result)   Collection Time   09/29/11  6:28 PM      Component Value Range Status Comment   Specimen Description BLOOD LEFT ARM  5 ML IN [facility] BOTTLE   Final    Special Requests Immunocompromised   Final    Culture  Setup Time [id number]   Final    Culture     Final    Value:        BLOOD CULTURE RECEIVED NO GROWTH TO DATE CULTURE WILL BE HELD FOR 5 DAYS BEFORE ISSUING A FINAL NEGATIVE REPORT   Report Status PENDING   Incomplete   CULTURE, BLOOD (ROUTINE X 2)     Status: Normal (Preliminary result)   Collection Time   09/29/11  6:32 PM      Component Value Range Status Comment   Specimen Description BLOOD RIGHT ARM  4 ML IN [facility] BOTTLE   Final    Special Requests Immunocompromised   Final    Culture  Setup Time [id number]   Final    Culture     Final    Value:        BLOOD CULTURE RECEIVED NO GROWTH TO DATE CULTURE WILL BE HELD FOR 5 DAYS BEFORE ISSUING A FINAL NEGATIVE REPORT   Report Status PENDING   Incomplete      Studies:              All Imaging reviewed and is as per above notation   Scheduled Meds:   . sodium chloride   Intravenous STAT  . docusate sodium  100 mg Oral BID  . enoxaparin  40 mg Subcutaneous QHS  . estrogens (conjugated)  0.625 mg Oral Daily  . fluticasone  1 puff Inhalation BID  . folic acid  1 mg Oral Daily  . insulin aspart  0-20 Units Subcutaneous TID WC  . insulin aspart  6 Units Subcutaneous Once  . insulin detemir  20 Units Subcutaneous Daily  . leflunomide  20 mg Oral Daily  . lisinopril  10 mg Oral Daily  . LORazepam  1 mg Oral Q8H  . metoprolol succinate  100 mg Oral Daily  . predniSONE  10 mg Oral Daily  . pregabalin  75 mg Oral TID  . senna  1 tablet Oral BID  . sodium chloride  1,000 mL Intravenous Once  . vancomycin  500 mg Intravenous Q24H  . vancomycin  1,000 mg Intravenous Once   Continuous  Infusions:   . sodium chloride 1,000 mL (09/30/11 4132)     Assessment/Plan: 1. Sepsis secondary to cellulitis of buttocks-patient spiked a fever of 100.7 overnight blood pressures have been low periodically although this may be secondary to her blood pressure medications. I have broadened spectrum of antibiotics with help of pharmacy as she is allergic to penicillin and had anaphylaxis in the past-we will add Flagyl and aztreonam. I will get general surgery involved in her care to determine if drainage might need to be done.  Continue pain control with tramadol 50 mg every 6 when necessary and  Dilantin 6.4 mg every 4 when necessary.  Get lactate thyrocalcitonin stat 2. Hypertension-patient usually on lisinopril 10, metoprolol 100. We'll continue metoprolol for now, discontinue lisinopril. We'll place bolus orders for blood pressures below 100/60. Keep metoprolol on board given patient might need surgery 3. Diabetes mellitus-probably worsened because she is on chronic steroids. Continue every 4 hourly checks--have discontinued diet and kept her n.p.o. She will need to be checked every 4 hours he and monitored. I have downward adjusted her Levemir to 15 units from 20 units. She probably has poor diabetic control because of chronic steroid use-will keep her on moderate sliding scale coverage given she is n.p.o. until seen by surgery. Metformin is on hold currently 4. Probable hemorrhoidal bleed-monitor CBC count. She has been seen by gastroenterology in the past, has a history of inflammatory bowel disease as well. Given her drop in hemoglobin from 12-8.4, Will recheck her counts every 12 hours. Will involve gastroenterology in her care if needed-her baseline hemoglobin is 8-9 5. Asthma/COPD/obstructive sleep apnea-typically uses oxygen 3 L per minute-Would watch for respiratory status closely given she is breathing heavier 6. 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 Would still keep on beta blocker at  present time. If her hemoglobin drops below 7, would transfuse. From my standpoint could potentially have intervention done without further cardiac clearance at present time, may need pulmonary clearance if needed per general surgery 7. Rheumatoid arthritis-continue rheumatoid meds prednisone and less flutamide currently by mouth with a sip of water. Likely will need to have these converted to IV if patient goes to surgery  Code Status: Full  Family Communication: Spoke w Husband in room  Disposition Plan: Tx to SDU for closer monitoring   Pleas Koch, MD  [facility] Pager [phone] 09/30/2011, 9:46 AM    LOS: 1 day

## 2011-09-30 NOTE — Progress Notes (Signed)
CRITICAL VALUE ALERT  Critical value received:  Positive MRSA PCR  Date of notification:  09/30/11  Time of notification:  1030  Critical value read back:yes  Nurse who received alert:  Orlene Och RN  MD notified (1st page):  Dr Mahala Menghini on floor. Informed verbally.  Time of first page:  0  MD notified (2nd page):  Time of second page:  Responding MD:  Dr Mahala Menghini  Time MD responded:  [phone]

## 2011-09-30 NOTE — Progress Notes (Signed)
Patient seen and examined.  Plan incision, drainage and debridement of complex right buttock/perianal abscess.  The procedure, risks and aftercare were discussed with her and her husband.  Risks include but are not limited to bleeding, wound problems, anesthesia and need for other procedures.  They seem to understand and agree with the plan.

## 2011-09-30 NOTE — Patient Care Conference (Signed)
Report called to SD unit RN Silvio Pate.Marisa [name]

## 2011-09-30 NOTE — Consult Note (Signed)
Reason for Consult:Right Buttocks Cellulitis Referring Physician: [name] is an 71 y.o. female.  HPI: The patient is a 71 year old female who was admitted to the hospital on 03/30/2010 with a right buttock cellulitis and abscess in evolution diagnosis. She presented with complaints of pain for about 5 days. She said she had a similar problem on the left side, but it sounds like she had some kind of skin tear that resolved with DuoDERM treatment.  She presented yesterday with a white count of 18,100. A CT scan shows a phlegmon in the right gluteal soft tissue without abscess or gas. Findings compatible with a soft tissue infection there was no extension into the deep pelvic tissues. There is no inguinal or iliac adenopathy. 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 When she was seen today she was febrile and tachycardic. Her white count has continued to rise moderately and she was transferred to the sent down care unit for closer monitoring. We were asked to see her in consultation. She has significant history of rheumatoid arthritis and is on steroids lefunomide., home O2 for asthma.     Past Medical History  Diagnosis Date  . Diabetic ketoacidosis 11/2010  . Myocardial infarction     Chest pain s/p normal cath in 08/2004 then NSTEMI during 11/2010 admission, negative Myoview  . Diabetes mellitus type 2, insulin dependent   . GERD (gastroesophageal reflux disease)   . Esophageal stricture     Dilation 06/2004  . Hypertension   . Supraventricular tachycardia   . history of  pericarditis 12/2002  . Pericardial effusion   . Rheumatoid arthritis     On MTX and chronic steroids  . Osteoarthritis   . Asthma     Home 3L O2   . Seizure 11/2008  . Rectal bleeding 11/2010    Large hemorrhoids  . Chronic anemia      Past Surgical History  Procedure Date  . Cholecystectomy   . Abdominal hysterectomy     History reviewed. No pertinent family  history.  Social History:  reports that she has never smoked. She does not have any smokeless tobacco history on file. She reports that she does not drink alcohol or use illicit drugs.  Allergies:  Allergies  Allergen Reactions  . Demerol (Meperidine)   . Penicillins Other (See Comments)    Throat swelling    Medications:  Prior to Admission:  Prescriptions prior to admission  Medication Sig Dispense Refill  . budesonide (PULMICORT) 180 MCG/ACT inhaler Inhale 1 puff into the lungs.        . docusate sodium (COLACE) 100 MG capsule Take 100 mg by mouth 2 (two) times daily. Daily as needed       . esomeprazole (NEXIUM) 40 MG capsule Take 40 mg by mouth daily before breakfast. Take 1 capsule twice daily.       Marland Kitchen estrogens, conjugated, (PREMARIN) 0.625 MG tablet Take 0.625 mg by mouth daily. Take daily for 21 days then do not take for 7 days.       . FeFum-FePoly-FA-B Cmp-C-Biot (INTEGRA PLUS) CAPS Take 1 capsule by mouth. Take 1 cap daily.       . ferrous fumarate-b12-vitamic C-folic acid (TRINSICON / FOLTRIN) capsule Take 1 capsule by mouth daily before breakfast.        . folic acid (FOLVITE) 1 MG tablet Take 1 mg by mouth daily.        Marland Kitchen glucosamine-chondroitin 500-400 MG tablet Take 1 tablet  by mouth 3 (three) times daily.      Marland Kitchen HYDROcodone-acetaminophen (VICODIN) 5-500 MG per tablet Take 1 tablet by mouth every 6 (six) hours as needed.        . hydrocortisone 2.5 % cream Apply topically 3 (three) times daily. Use rectally       . INSULIN ASPART [place] Inject 7-8 Units into the skin. Sliding scale as needed. Usually 7-8u with meals      . insulin detemir (LEVEMIR) 100 UNIT/ML injection Inject 20 Units into the skin daily. 20U AT NOON      . leflunomide (ARAVA) 20 MG tablet Take 20 mg by mouth daily.      Marland Kitchen levalbuterol (XOPENEX HFA) 45 MCG/ACT inhaler Inhale 1-2 puffs into the lungs every 4 (four) hours as needed. 1-2 puffs every 6 hours as needed.       Marland Kitchen lisinopril (PRINIVIL,ZESTRIL) 20  MG tablet Take 10 mg by mouth daily.       Marland Kitchen LORazepam (ATIVAN) 1 MG tablet Take 1 mg by mouth every 8 (eight) hours. 1 in the am and 2 in the pm      . magnesium oxide (MAG-OX) 400 MG tablet Take 400 mg by mouth daily.      . metFORMIN (GLUCOPHAGE) 500 MG tablet Take 500 mg by mouth 2 (two) times daily with a meal.       . metoprolol (TOPROL XL) 100 MG 24 hr tablet Take 100 mg by mouth daily.        . Multiple Vitamin (MULTIVITAMINS PO) Take 1 tablet by mouth. Daily.       . potassium chloride SA (K-DUR,KLOR-CON) 20 MEQ tablet Take 20 mEq by mouth. Take 1 tab twice daily.       . predniSONE (DELTASONE) 10 MG tablet Take 10 mg by mouth daily.      . pregabalin (LYRICA) 75 MG capsule Take 75 mg by mouth 3 (three) times daily. 1 IN THE AM AND 2 IN THE PM      . traMADol (ULTRAM) 50 MG tablet Take 50 mg by mouth every 6 (six) hours as needed. Take 1 tab every 4 hours as needed for pain.       . rosuvastatin (CRESTOR) 10 MG tablet Take 10 mg by mouth daily.         Scheduled:   . aztreonam  1 g Intravenous Q8H  . Chlorhexidine Gluconate Cloth  6 each Topical Q0600  . docusate sodium  100 mg Oral BID  . enoxaparin  40 mg Subcutaneous QHS  . estrogens (conjugated)  0.625 mg Oral Daily  . fluticasone  1 puff Inhalation BID  . insulin aspart  0-15 Units Subcutaneous Q4H  . insulin aspart  6 Units Subcutaneous Once  . insulin detemir  15 Units Subcutaneous Daily  . leflunomide  20 mg Oral Daily  . LORazepam  1 mg Oral Q8H  . metoprolol succinate  100 mg Oral Daily  . metronidazole  500 mg Intravenous Q8H  . mupirocin ointment  1 application Nasal BID  . predniSONE  10 mg Oral Daily  . senna  1 tablet Oral BID  . sodium chloride  1,000 mL Intravenous Once  . vancomycin  500 mg Intravenous Q24H  . vancomycin  1,000 mg Intravenous Once  . DISCONTD: sodium chloride   Intravenous STAT  . DISCONTD: folic acid  1 mg Oral Daily  . DISCONTD: insulin aspart  0-20 Units Subcutaneous TID WC  .  DISCONTD: insulin  detemir  20 Units Subcutaneous Daily  . DISCONTD: lisinopril  10 mg Oral Daily  . DISCONTD: pregabalin  75 mg Oral TID   Continuous:   . sodium chloride 200 mL/hr at 09/30/11 1427   AOZ:HYQMVHQIONGEX, acetaminophen, albuterol, HYDROcodone-acetaminophen, iohexol, metoprolol, morphine injection, ondansetron (ZOFRAN) IV, ondansetron, traMADol, DISCONTD:  HYDROmorphone (DILAUDID) injection, DISCONTD: ondansetron (ZOFRAN) IV Anti-infectives     Start     Dose/Rate Route Frequency Ordered Stop   09/30/11 1800   vancomycin (VANCOCIN) 500 mg in sodium chloride 0.9 % 100 mL IVPB        500 mg 100 mL/hr over 60 Minutes Intravenous Every 24 hours 09/29/11 2343     09/30/11 1200   metroNIDAZOLE (FLAGYL) IVPB 500 mg        500 mg 100 mL/hr over 60 Minutes Intravenous Every 8 hours 09/30/11 1026     09/30/11 1100   aztreonam (AZACTAM) 1 g in dextrose 5 % 50 mL IVPB        1 g 100 mL/hr over 30 Minutes Intravenous Every 8 hours 09/30/11 1032     09/29/11 1900   vancomycin (VANCOCIN) IVPB 1000 mg/200 mL premix        1,000 mg 200 mL/hr over 60 Minutes Intravenous  Once 09/29/11 1822 09/29/11 1935          Results for orders placed during the hospital encounter of 09/29/11 (from the past 48 hour(s))  GLUCOSE, CAPILLARY     Status: Abnormal   Collection Time   09/29/11  2:39 PM      Component Value Range Comment   Glucose-Capillary 274 (*) 70 - 99 mg/dL    Comment 1 Notify RN      Comment 2 Documented in Chart     CBC     Status: Abnormal   Collection Time   09/29/11  4:58 PM      Component Value Range Comment   WBC 18.1 (*) 4.0 - 10.5 K/uL    RBC 2.92 (*) 3.87 - 5.11 MIL/uL    Hemoglobin 8.1 (*) 12.0 - 15.0 g/dL    HCT 52.8 (*) 41.3 - 46.0 %    MCV 88.7  78.0 - 100.0 fL    MCH 27.7  26.0 - 34.0 pg    MCHC 31.3  30.0 - 36.0 g/dL    RDW 24.4  01.0 - 27.2 %    Platelets 379  150 - 400 K/uL   DIFFERENTIAL     Status: Abnormal   Collection Time   09/29/11  4:58 PM       Component Value Range Comment   Neutrophils Relative 83 (*) 43 - 77 %    Neutro Abs 15.1 (*) 1.7 - 7.7 K/uL    Lymphocytes Relative 6 (*) 12 - 46 %    Lymphs Abs 1.0  0.7 - 4.0 K/uL    Monocytes Relative 10  3 - 12 %    Monocytes Absolute 1.8 (*) 0.1 - 1.0 K/uL    Eosinophils Relative 1  0 - 5 %    Eosinophils Absolute 0.1  0.0 - 0.7 K/uL    Basophils Relative 0  0 - 1 %    Basophils Absolute 0.0  0.0 - 0.1 K/uL   BASIC METABOLIC PANEL     Status: Abnormal   Collection Time   09/29/11  4:58 PM      Component Value Range Comment   Sodium 130 (*) 135 - 145 mEq/L    Potassium  4.6  3.5 - 5.1 mEq/L    Chloride 95 (*) 96 - 112 mEq/L    CO2 25  19 - 32 mEq/L    Glucose, Bld 310 (*) 70 - 99 mg/dL    BUN 13  6 - 23 mg/dL    Creatinine, Ser 1.91  0.50 - 1.10 mg/dL    Calcium 8.5  8.4 - 47.8 mg/dL    GFR calc non Af Amer 53 (*) >90 mL/min    GFR calc Af Amer 61 (*) >90 mL/min   CULTURE, BLOOD (ROUTINE X 2)     Status: Normal (Preliminary result)   Collection Time   09/29/11  6:28 PM      Component Value Range Comment   Specimen Description BLOOD LEFT ARM  5 ML IN [facility] BOTTLE      Special Requests Immunocompromised      Culture  Setup Time [id number]      Culture        Value:        BLOOD CULTURE RECEIVED NO GROWTH TO DATE CULTURE WILL BE HELD FOR 5 DAYS BEFORE ISSUING A FINAL NEGATIVE REPORT   Report Status PENDING     CULTURE, BLOOD (ROUTINE X 2)     Status: Normal (Preliminary result)   Collection Time   09/29/11  6:32 PM      Component Value Range Comment   Specimen Description BLOOD RIGHT ARM  4 ML IN [facility] BOTTLE      Special Requests Immunocompromised      Culture  Setup Time [id number]      Culture        Value:        BLOOD CULTURE RECEIVED NO GROWTH TO DATE CULTURE WILL BE HELD FOR 5 DAYS BEFORE ISSUING A FINAL NEGATIVE REPORT   Report Status PENDING     GLUCOSE, CAPILLARY     Status: Abnormal   Collection Time   09/29/11 11:31 PM      Component Value Range Comment    Glucose-Capillary 483 (*) 70 - 99 mg/dL    Comment 1 Documented in Chart      Comment 2 Notify RN     GLUCOSE, CAPILLARY     Status: Abnormal   Collection Time   09/30/11  1:25 AM      Component Value Range Comment   Glucose-Capillary 392 (*) 70 - 99 mg/dL    Comment 1 Documented in Chart      Comment 2 Notify RN     URINALYSIS, ROUTINE W REFLEX MICROSCOPIC     Status: Abnormal   Collection Time   09/30/11  1:45 AM      Component Value Range Comment   Color, Urine YELLOW  YELLOW    APPearance CLOUDY (*) CLEAR    Specific Gravity, Urine 1.032 (*) 1.005 - 1.030    pH 5.5  5.0 - 8.0    Glucose, UA >1000 (*) NEGATIVE mg/dL    Hgb urine dipstick SMALL (*) NEGATIVE    Bilirubin Urine NEGATIVE  NEGATIVE    Ketones, ur >80 (*) NEGATIVE mg/dL    Protein, ur NEGATIVE  NEGATIVE mg/dL    Urobilinogen, UA 0.2  0.0 - 1.0 mg/dL    Nitrite NEGATIVE  NEGATIVE    Leukocytes, UA SMALL (*) NEGATIVE   URINE MICROSCOPIC-ADD ON     Status: Abnormal   Collection Time   09/30/11  1:45 AM      Component Value Range Comment   Squamous  Epithelial / LPF FEW (*) RARE    WBC, UA 3-6  <3 WBC/hpf    Bacteria, UA RARE  RARE   BASIC METABOLIC PANEL     Status: Abnormal   Collection Time   09/30/11  3:51 AM      Component Value Range Comment   Sodium 130 (*) 135 - 145 mEq/L    Potassium 4.7  3.5 - 5.1 mEq/L    Chloride 95 (*) 96 - 112 mEq/L    CO2 21  19 - 32 mEq/L    Glucose, Bld 243 (*) 70 - 99 mg/dL    BUN 13  6 - 23 mg/dL    Creatinine, Ser 9.56  0.50 - 1.10 mg/dL    Calcium 8.6  8.4 - 21.3 mg/dL    GFR calc non Af Amer 51 (*) >90 mL/min    GFR calc Af Amer 60 (*) >90 mL/min   CBC     Status: Abnormal   Collection Time   09/30/11  3:51 AM      Component Value Range Comment   WBC 19.9 (*) 4.0 - 10.5 K/uL    RBC 3.02 (*) 3.87 - 5.11 MIL/uL    Hemoglobin 8.4 (*) 12.0 - 15.0 g/dL    HCT 08.6 (*) 57.8 - 46.0 %    MCV 88.7  78.0 - 100.0 fL    MCH 27.8  26.0 - 34.0 pg    MCHC 31.3  30.0 - 36.0 g/dL     RDW 46.9  62.9 - 52.8 %    Platelets 417 (*) 150 - 400 K/uL   GLUCOSE, CAPILLARY     Status: Abnormal   Collection Time   09/30/11  7:40 AM      Component Value Range Comment   Glucose-Capillary 323 (*) 70 - 99 mg/dL   MRSA PCR SCREENING     Status: Abnormal   Collection Time   09/30/11  8:12 AM      Component Value Range Comment   MRSA by PCR POSITIVE (*) NEGATIVE   GLUCOSE, CAPILLARY     Status: Abnormal   Collection Time   09/30/11 10:41 AM      Component Value Range Comment   Glucose-Capillary 233 (*) 70 - 99 mg/dL    Comment 1 Notify RN     LACTIC ACID, PLASMA     Status: Normal   Collection Time   09/30/11 11:40 AM      Component Value Range Comment   Lactic Acid, Venous 2.0  0.5 - 2.2 mmol/L   PROCALCITONIN     Status: Normal   Collection Time   09/30/11 11:40 AM      Component Value Range Comment   Procalcitonin 29.73     CARDIAC PANEL(CRET KIN+CKTOT+MB+TROPI)     Status: Normal   Collection Time   09/30/11 11:40 AM      Component Value Range Comment   Total CK 25  7 - 177 U/L    CK, MB 1.7  0.3 - 4.0 ng/mL    Troponin I <0.30  <0.30 ng/mL    Relative Index RELATIVE INDEX IS INVALID  0.0 - 2.5   GLUCOSE, CAPILLARY     Status: Abnormal   Collection Time   09/30/11 12:13 PM      Component Value Range Comment   Glucose-Capillary 171 (*) 70 - 99 mg/dL     Ct Pelvis W Contrast  09/29/2011  *RADIOLOGY REPORT*  Clinical Data:  Soft tissue infection in  the right gluteal region.  CT PELVIS WITH CONTRAST  Technique:  Multidetector CT imaging of the pelvis was performed using the standard protocol following the bolus administration of intravenous contrast.  Contrast: OMNIPAQUE IOHEXOL 300 MG/ML  SOLN  Comparison:  11/26/2010 MRI.  Findings:  Marked inflammatory changes are present in the dorsal right gluteal subcutaneous fat.  There is no well-defined abscess. No gas is present in the soft tissues.  Inflammatory changes extend up to the right side of the gluteal cleft.   Hysterectomy.  Visceral pelvis grossly appears within normal limits aside from a cystic lesion in the right anatomic pelvis, compatible with a small right ovarian cyst.  There is no inguinal adenopathy.  No iliac adenopathy.  No destructive osseous lesions are present.  Lumbar spondylosis at L4-L5 and L5-S1 is severe.  Bilateral obturator ring nonunion is present.  IMPRESSION:  1.  Phlegmon in the right gluteal soft tissues without abscess or gas.  Findings compatible with superficial soft tissue infection. No extension of the deep pelvic tissues. 2.  Bilateral obturator ring nonunion.  Original Report Authenticated By: Andreas Newport, M.D.    Review of Systems  Constitutional: Negative.   HENT: Negative.   Eyes: Negative.   Cardiovascular: Negative.   Gastrointestinal: Positive for heartburn (On PPI), nausea and diarrhea. Negative for vomiting and abdominal pain.  Genitourinary: Negative.   Musculoskeletal:       Chronic hand, legs, knees, and foot pain.  Skin: Negative.   Neurological: Negative.   Endo/Heme/Allergies: Negative.   Psychiatric/Behavioral: Negative.    Blood pressure 112/51, pulse 130, temperature 99.1 F (37.3 C), temperature source Oral, resp. rate 35, height 4\' 11"  (1.499 m), weight 55 kg (121 lb 4.1 oz), SpO2 98.00%. Physical Exam  Constitutional: She is oriented to person, place, and time.       Chronically ill appearing 49 y/0 female. Tachycardic, with a fair amount of pain from Right buttocks cellulits  HENT:  Head: Normocephalic and atraumatic.  Nose: Nose normal.  Eyes: Conjunctivae and EOM are normal. Pupils are equal, round, and reactive to light. Right eye exhibits no discharge. Left eye exhibits no discharge. No scleral icterus.  Neck: Normal range of motion. Neck supple. No JVD present. No tracheal deviation present. No thyromegaly present.  Cardiovascular: Regular rhythm, normal heart sounds and intact distal pulses.  Exam reveals no gallop.   No murmur  heard. Respiratory: Effort normal and breath sounds normal. No stridor. No respiratory distress. She has no wheezes. She has no rales. She exhibits no tenderness.  GI: Soft. Bowel sounds are normal. She exhibits no distension and no mass. There is no tenderness. There is no rebound and no guarding.  Genitourinary:       She has a large area of cellulitis going from just below her coccyx to the labia on the Right side.  It is tender, swollen, and erythematous.  Musculoskeletal: She exhibits no edema and no tenderness.       She has significant hand deformities from her arthritis  Lymphadenopathy:    She has no cervical adenopathy.  Neurological: She is alert and oriented to person, place, and time. She has normal reflexes. No cranial nerve deficit.  Skin: Skin is warm and dry. She is not diaphoretic. There is erythema.       See buttock on Right above.  Psychiatric: She has a normal mood and affect. Her behavior is normal. Judgment and thought content normal.    Assessment/Plan: 1. Severe cellulitis right  buttocks, probable early abscess, on immunosuppression and steroids. 2. Severe rheumatoid arthritis 3. Insulin-dependent diabetes 4. History of MI 2006 and 8 [address]. 5.History of SVT 6. History of pericarditis/effusion 7. History of seizure 8. Chronic anemia 9. GERD  Plan: Continue medical management, I reviewed the CT with Dr. Biagio Quint and we will follow with you. Dr. Biagio Quint has looked at it again tonight, and thinks we may be able to drain something now.  We will make her NPO and see if we can get it drained in the OR tonight.1700 hrs.  Marisa [name],Marisa [name] 09/30/2011, 2:45 PM   She has induration and cellulitis and now with a small punctum in the center.  Ct without obvious abscess but wbc 19k and very tender. I think that she would likely benefit from I/D.

## 2011-09-30 NOTE — Anesthesia Preprocedure Evaluation (Signed)
Anesthesia Evaluation  Patient identified by MRN, date of birth, ID band Patient awake    Reviewed: Allergy & Precautions, H&P , NPO status , Patient's Chart, lab work & pertinent test results  Airway Mallampati: II TM Distance: <3 FB Neck ROM: Full    Dental No notable dental hx.    Pulmonary asthma , COPD oxygen dependent,  breath sounds clear to auscultation  Pulmonary exam normal       Cardiovascular hypertension, + Past MI Rhythm:Regular Rate:Normal     Neuro/Psych negative neurological ROS  negative psych ROS   GI/Hepatic negative GI ROS, Neg liver ROS,   Endo/Other  Diabetes mellitus-, Poorly Controlled, Insulin Dependent  Renal/GU negative Renal ROS  negative genitourinary   Musculoskeletal  (+) Arthritis -, on steriods ,    Abdominal   Peds negative pediatric ROS (+)  Hematology negative hematology ROS (+)   Anesthesia Other Findings   Reproductive/Obstetrics negative OB ROS                           Anesthesia Physical Anesthesia Plan  ASA: III and Emergent  Anesthesia Plan: General   Post-op Pain Management:    Induction: Intravenous and Rapid sequence  Airway Management Planned: Oral ETT  Additional Equipment:   Intra-op Plan:   Post-operative Plan: Extubation in OR and Possible Post-op intubation/ventilation  Informed Consent: I have reviewed the patients History and Physical, chart, labs and discussed the procedure including the risks, benefits and alternatives for the proposed anesthesia with the patient or authorized representative who has indicated his/her understanding and acceptance.   Dental advisory given  Plan Discussed with: CRNA  Anesthesia Plan Comments:         Anesthesia Quick Evaluation

## 2011-10-01 DIAGNOSIS — R6510 Systemic inflammatory response syndrome (SIRS) of non-infectious origin without acute organ dysfunction: Secondary | ICD-10-CM

## 2011-10-01 DIAGNOSIS — E1165 Type 2 diabetes mellitus with hyperglycemia: Secondary | ICD-10-CM

## 2011-10-01 DIAGNOSIS — L03317 Cellulitis of buttock: Secondary | ICD-10-CM

## 2011-10-01 DIAGNOSIS — L0231 Cutaneous abscess of buttock: Secondary | ICD-10-CM

## 2011-10-01 DIAGNOSIS — M069 Rheumatoid arthritis, unspecified: Secondary | ICD-10-CM

## 2011-10-01 LAB — CBC
HCT: 22.8 % — ABNORMAL LOW (ref 36.0–46.0)
Hemoglobin: 7.2 g/dL — ABNORMAL LOW (ref 12.0–15.0)
MCH: 27.6 pg (ref 26.0–34.0)
MCHC: 31.6 g/dL (ref 30.0–36.0)

## 2011-10-01 LAB — GLUCOSE, CAPILLARY
Glucose-Capillary: 107 mg/dL — ABNORMAL HIGH (ref 70–99)
Glucose-Capillary: 124 mg/dL — ABNORMAL HIGH (ref 70–99)
Glucose-Capillary: 151 mg/dL — ABNORMAL HIGH (ref 70–99)
Glucose-Capillary: 84 mg/dL (ref 70–99)

## 2011-10-01 LAB — COMPREHENSIVE METABOLIC PANEL
ALT: 11 U/L (ref 0–35)
AST: 20 U/L (ref 0–37)
Calcium: 7.9 mg/dL — ABNORMAL LOW (ref 8.4–10.5)
Sodium: 135 mEq/L (ref 135–145)
Total Protein: 5.1 g/dL — ABNORMAL LOW (ref 6.0–8.3)

## 2011-10-01 MED ORDER — PRO-STAT SUGAR FREE PO LIQD
30.0000 mL | Freq: Two times a day (BID) | ORAL | Status: DC
  Administered 2011-10-01 – 2011-10-08 (×8): 30 mL via ORAL
  Filled 2011-10-01 (×17): qty 30

## 2011-10-01 MED ORDER — MORPHINE SULFATE 2 MG/ML IJ SOLN
1.0000 mg | INTRAMUSCULAR | Status: DC | PRN
  Administered 2011-10-01 – 2011-10-08 (×16): 2 mg via INTRAVENOUS
  Filled 2011-10-01 (×15): qty 1
  Filled 2011-10-01: qty 2

## 2011-10-01 MED ORDER — DIPHENHYDRAMINE HCL 25 MG PO CAPS
25.0000 mg | ORAL_CAPSULE | Freq: Once | ORAL | Status: AC
  Administered 2011-10-01: 25 mg via ORAL
  Filled 2011-10-01: qty 1

## 2011-10-01 MED ORDER — FUROSEMIDE 10 MG/ML IJ SOLN
20.0000 mg | Freq: Once | INTRAMUSCULAR | Status: AC
  Administered 2011-10-01: 20 mg via INTRAVENOUS
  Filled 2011-10-01: qty 2

## 2011-10-01 MED ORDER — ACETAMINOPHEN 325 MG PO TABS
650.0000 mg | ORAL_TABLET | Freq: Once | ORAL | Status: AC
  Administered 2011-10-01: 650 mg via ORAL
  Filled 2011-10-01: qty 2

## 2011-10-01 NOTE — Clinical Documentation Improvement (Signed)
Anemia Blood Loss Clarification  THIS DOCUMENT IS NOT A PERMANENT PART OF THE MEDICAL RECORD  RESPOND TO THE THIS QUERY, FOLLOW THE INSTRUCTIONS BELOW:  1. If needed, update documentation for the patient's encounter via the notes activity.  2. Access this query again and click edit on the In [employer].  3. After updating, or not, click F2 to complete all highlighted (required) fields concerning your review. Select "additional documentation in the medical record" OR "no additional documentation provided".  4. Click Sign note button.  5. The deficiency will fall out of your In Basket *Please let us know if you are not able to complete this workflow by phone or e-mail (listed below).        10/01/11  Dear Dr. Mahala Menghini, Marisa [name]  In an effort to better capture your patient's severity of illness, reflect appropriate length of stay and utilization of resources, a review of the patient medical record has revealed the following indicators.    Based on your clinical judgment, please clarify and document in a progress note and/or discharge summary the clinical condition associated with the following supporting information:  In responding to this query please exercise your independent judgment.  The fact that a query is asked, does not imply that any particular answer is desired or expected.  Possible Clinical Conditions?   " Expected Acute Blood Loss Anemia  " THis is Acute Blood Loss Anemia, which is expected 2/2 to surgery  " Acute on chronic blood loss anemia  " Other Condition________________  " Cannot Clinically Determine  Risk Factors: (recent surgery, pre op anemia, EBL in OR)  Supporting Information: Sepsis/Cellulitis/Perirectal abscess/ Signs and Symptoms   Diagnostics: Op note: 09/30/11 2025  Estimated Blood Loss:  less than 100 mL   Component     Latest Ref Rng 10/01/2011         3:35 AM  Hemoglobin     12.0 - 15.0 g/dL 7.2 (L)  HCT     16.1 - 46.0 % 22.8  (L)   Component      Hemoglobin HCT  Latest Ref Rng      12.0 - 15.0 g/dL 09.6 - 04.5 %  07/22/8117     3:51 AM 8.4 (L) 26.8 (L)   Treatments: Component     Latest Ref Rng 10/01/2011        10:15 AM  ABO/RH(D)      O POS  Antibody Screen      NEG  Sample Expiration      10/04/2011  Unit Number      [id number]  Blood Component Type      RED CELLS,LR  Unit division      00  Status of Unit      ISSUED  Transfusion Status      OK TO TRANSFUSE  Crossmatch Result      Compatible    Transfusion: 1 u PRBCs fluids /  Serial H&H monitoring Medications (Fe, Procrit)  Reviewed:  no additional documentation provided  Thank You,  Enis Slipper  RN, BSN, CCDS Clinical Documentation Specialist [facility] HIM Dept Pager: [phone] / E-mail: [email or web address]  Health Information Management [place]

## 2011-10-01 NOTE — Progress Notes (Signed)
Initial visit with pt on referral from nursing.  Pt lying on bed.    Pt spoke with chaplain about her relationship with husband and her course or illness.  Chaplain provided support for grief around relationship changes, stress due to illness and changes in lifestyle after retirement.  Pt voiced feeling alone, unsure of strength of relationship.  Pt's husband returned to room during conversation and pt shifted to speaking about illness and requesting prayer.   Chaplain prayed with pt and will continue to follow for further assessment and support.    Belva Crome  MDiv, Chaplain    10/01/11 1500  Clinical Encounter Type  Visited With Patient;Patient and family together  Visit Type Initial;Psychological support;Spiritual support;Social support  Referral From Nurse  Consult/Referral To Chaplain  Recommendations Follow up for continued assessment   Spiritual Encounters  Spiritual Needs Prayer;Emotional;Grief support  Stress Factors  Patient Stress Factors Family relationships

## 2011-10-01 NOTE — Progress Notes (Signed)
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 Obstructive sleep apnea-on 3 L of home O2 ? 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 Abbey Chatters Gen Surgery  Antibiotics:  Vancomycin 6/16  Aztreonam 6/17  Flagyll 6/17  Cultures from 6/17 pending   Subjective  Patient postop day #1. Anxious and scared. Does not recall much of Yesterday's events Husband at bedside and in room-aware of events overnight. Patient denies any chest pain or any buttock pain at present.  No shortness of breath Patient had some nausea late last night and was n.p.o.   Objective    Interim  History: Reviewed all records and all  Subjective: Nursing reports 1-2 episodes of slightly dark stool which may of been frank blood. Patient was having some chest pain an EKG was done last night.  Objective: Filed Vitals:   10/01/11 0300 10/01/11 0400 10/01/11 0500 10/01/11 0800  BP: 157/48  131/38   Pulse: 108  101   Temp:  98.6 F (37 C)  97.6 F (36.4 C)  TempSrc:  Oral  Oral  Resp: 22  19   Height:      Weight:  58.3 kg (128 lb 8.5 oz)    SpO2: 91%  99%     Intake/Output Summary (Last 24 hours) at 10/01/11 5621 Last data filed at 10/01/11 0600  Gross per 24 hour  Intake   5530 ml  Output    940 ml  Net   4590 ml    Exam:  General: Alert well appearing Caucasian female,  Cardiovascular: S1-S2 no murmur rub or gallop-tachycardic=Tele tachy (Sinus tach) to 140's, no redlalarms Respiratory: Clinically clear Abdomen: Soft nontender-cellulitic changes to the right buttocks with pointing area of her pimple. Her erythema seems to be somewhat contained.  Rectal exam deferred Skin see above-has rheumatoid changes and gnarled knuckles Neuro grossly intact, less sleepy.  Data Reviewed: Basic Metabolic Panel:  Lab 10/01/11 3086 09/30/11 0351 09/29/11 1658  NA 135 130* 130*  K 4.4 4.7 --  CL 103 95* 95*  CO2 21 21 25   GLUCOSE 118* 243* 310*  BUN 11 13 13   CREATININE 1.02 1.07 1.05  CALCIUM 7.9* 8.6 8.5  MG -- -- --  PHOS -- -- --   Liver Function Tests:  Lab 10/01/11 0335  AST 20  ALT 11  ALKPHOS 113  BILITOT 0.2*  PROT 5.1*  ALBUMIN 1.9*   No results found for this basename: LIPASE:5,AMYLASE:5 in the last 168  hours No results found for this basename: AMMONIA:5 in the last 168 hours CBC:  Lab 10/01/11 0335 09/30/11 0351 09/29/11 1658  WBC 21.3* 19.9* 18.1*  NEUTROABS -- -- 15.1*  HGB 7.2* 8.4* 8.1*  HCT 22.8* 26.8* 25.9*  MCV 87.4 88.7 88.7  PLT 342 417* 379   Cardiac Enzymes:  Lab 09/30/11 1140  CKTOTAL 25  CKMB 1.7  CKMBINDEX --  TROPONINI  <0.30   BNP: No components found with this basename: POCBNP:5 CBG:  Lab 10/01/11 0752 10/01/11 0321 10/01/11 0006 09/30/11 2038 09/30/11 2009  GLUCAP 177* 107* 151* 193* 184*    Recent Results (from the past 240 hour(s))  CULTURE, BLOOD (ROUTINE X 2)     Status: Normal (Preliminary result)   Collection Time   09/29/11  6:28 PM      Component Value Range Status Comment   Specimen Description BLOOD LEFT ARM  5 ML IN [facility] BOTTLE   Final    Special Requests Immunocompromised   Final    Culture  Setup Time [id number]   Final    Culture     Final    Value: GRAM POSITIVE RODS     Note: Gram Stain Report Called to,Read Back By and Verified With: CHERYL DENNY @0309  ON 10/01/2011 BY MCLET   Report Status PENDING   Incomplete   CULTURE, BLOOD (ROUTINE X 2)     Status: Normal (Preliminary result)   Collection Time   09/29/11  6:32 PM      Component Value Range Status Comment   Specimen Description BLOOD RIGHT ARM  4 ML IN [facility] BOTTLE   Final    Special Requests Immunocompromised   Final    Culture  Setup Time [id number]   Final    Culture     Final    Value:        BLOOD CULTURE RECEIVED NO GROWTH TO DATE CULTURE WILL BE HELD FOR 5 DAYS BEFORE ISSUING A FINAL NEGATIVE REPORT   Report Status PENDING   Incomplete   MRSA PCR SCREENING     Status: Abnormal   Collection Time   09/30/11  8:12 AM      Component Value Range Status Comment   MRSA by PCR POSITIVE (*) NEGATIVE Final   ANAEROBIC CULTURE     Status: Normal (Preliminary result)   Collection Time   09/30/11  8:35 PM      Component Value Range Status Comment   Specimen Description ABSCESS   Final    Special Requests NONE   Final    Gram Stain     Final    Value: RARE WBC PRESENT,BOTH PMN AND MONONUCLEAR     NO SQUAMOUS EPITHELIAL CELLS SEEN     FEW GRAM POSITIVE COCCI IN PAIRS   Culture PENDING   Incomplete    Report Status PENDING   Incomplete   CULTURE, ROUTINE-ABSCESS     Status: Normal (Preliminary result)   Collection Time    09/30/11  8:35 PM      Component Value Range Status Comment   Specimen Description ABSCESS   Final    Special Requests NONE   Final    Gram Stain     Final    Value: RARE WBC PRESENT,BOTH PMN AND MONONUCLEAR     NO SQUAMOUS EPITHELIAL CELLS SEEN     FEW GRAM POSITIVE COCCI IN PAIRS   Culture PENDING   Incomplete    Report Status PENDING   Incomplete   ANAEROBIC  CULTURE     Status: Normal (Preliminary result)   Collection Time   09/30/11  8:41 PM      Component Value Range Status Comment   Specimen Description ABSCESS   Final    Special Requests NONE   Final    Gram Stain     Final    Value: ABUNDANT WBC PRESENT,BOTH PMN AND MONONUCLEAR     NO SQUAMOUS EPITHELIAL CELLS SEEN     MODERATE GRAM POSITIVE COCCI IN PAIRS     IN CLUSTERS   Culture PENDING   Incomplete    Report Status PENDING   Incomplete   CULTURE, ROUTINE-ABSCESS     Status: Normal (Preliminary result)   Collection Time   09/30/11  8:41 PM      Component Value Range Status Comment   Specimen Description ABSCESS   Final    Special Requests NONE   Final    Gram Stain     Final    Value: ABUNDANT WBC PRESENT,BOTH PMN AND MONONUCLEAR     NO SQUAMOUS EPITHELIAL CELLS SEEN     MODERATE GRAM POSITIVE COCCI IN PAIRS     IN CLUSTERS   Culture PENDING   Incomplete    Report Status PENDING   Incomplete      Studies:              All Imaging reviewed and is as per above notation   Scheduled Meds:    . aztreonam  1 g Intravenous Q8H  . Chlorhexidine Gluconate Cloth  6 each Topical Q0600  . docusate sodium  100 mg Oral BID  . enoxaparin  40 mg Subcutaneous QHS  . estrogens (conjugated)  0.625 mg Oral Daily  . fluticasone  1 puff Inhalation BID  . insulin aspart      . insulin aspart  0-15 Units Subcutaneous Q4H  . insulin detemir  15 Units Subcutaneous Daily  . leflunomide  20 mg Oral Daily  . LORazepam  1 mg Oral Q8H  . metoprolol succinate  100 mg Oral Daily  . metronidazole  500 mg Intravenous Q8H  . mupirocin  ointment  1 application Nasal BID  . ondansetron      . predniSONE  10 mg Oral Daily  . senna  1 tablet Oral BID  . vancomycin  500 mg Intravenous Q24H  . DISCONTD: sodium chloride   Intravenous STAT  . DISCONTD: folic acid  1 mg Oral Daily  . DISCONTD: insulin aspart  0-20 Units Subcutaneous TID WC  . DISCONTD: insulin detemir  20 Units Subcutaneous Daily  . DISCONTD: lisinopril  10 mg Oral Daily  . DISCONTD: pregabalin  75 mg Oral TID   Continuous Infusions:    . sodium chloride 200 mL/hr (10/01/11 0247)     Assessment/Plan: 1. Sepsis secondary to cellulitis of buttocks, s/p Complex drainage 6/17-temp overnight 100.4.  Broadened spectrum of antibiotics Flagyl and aztreonam 6/18.  Continue pain control with tramadol 50 mg every 6 when necessary, fentanyl 25-50 mcg, morphine 1-2 mg every 3 when necessary.  Disposition and plan of care for cellulitis dependent on cultures, would cautiously placed on by mouth fluids until seen by general surgery-unclear if further need for surgical intervention. Wound has not been examined by myself today given recent surgery--no oral currently for stress dose steroids as blood pressures are fine 2. Leukocytosis-multifactorial-most likely secondary to acute infection in buttocks, but patient also on oral steroids. Given blood pressures and 3. Anemia-likely secondary to acute blood  loss from surgery, there was some report of dark vomit.  She is also getting IVF 200 cc per hopur-so could be dilutional as well Will hold on any overt gastrointestinal evaluation at present, and transfuse 1 unit of packed red blood cells. 4. Sinus tachycardia-pulse rate in the 130s to 140s-likely multifactorial secondary to anemia blood loss, anxiety, sepsis.  Continue metoprolol, add back Ativan 1 mg Q8 hourly for anxiety--patient also on metoprolol 2.5-5 mg every 6 when necessary for parameters with heart rate of 150 5. Hypertension-patient usually on lisinopril 10, metoprolol 100.  We'll continue metoprolol for now, discontinue lisinopril. We'll place IVF bolus orders for blood pressures below 100/60.  6. Diabetes mellitus-probably worsened because she is on chronic steroids-given her blood sugars have come down promptly with surgery, it was likely also secondary to spreading infection and abscess. Will place on 4 times a day a.c. at bedtime sensitive coverage for now patient to be on clear liquids until seen by surgeon. Continue lantus 15 units. Metformin is on hold currently 7. Probable hemorrhoidal bleed-monitor CBC count. She has been seen by gastroenterology in the past, has a history of inflammatory bowel disease as well. Given her drop in hemoglobin from 12-8.4, Will recheck her counts every 12 hours. Will involve gastroenterology in her care if needed-her baseline hemoglobin is 8-9 8. Asthma/COPD/obstructive sleep apnea-typically uses oxygen 3 L per minute-Would watch for respiratory status closely given she is breathing heavier 9. 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 Would still keep on beta blocker at present time.. From my standpoint could potentially have intervention done without further cardiac clearance at present time, may need pulmonary clearance if needed per general surgery 10. Rheumatoid arthritis-continue rheumatoid meds prednisone and less flutamide currently by mouth with a sip of water. Likely will need to have these converted to IV if patient goes to surgery  Code Status: Full  Family Communication: Spoke w Husband in room  Disposition Plan:  SDU for closer monitoring for now   Pleas Koch, MD  [facility] Pager [phone] 10/01/2011, 8:22 AM    LOS: 2 days

## 2011-10-01 NOTE — Progress Notes (Signed)
Utilization review completed.  

## 2011-10-01 NOTE — Progress Notes (Addendum)
INITIAL ADULT NUTRITION ASSESSMENT Date: 10/01/2011   Time: 3:04 PM Reason for Assessment: Nutrition risk for dysphagia  ASSESSMENT: Female 71 y.o.  Dx: Cellulitis of buttock, right  Hx:  Past Medical History  Diagnosis Date  . Diabetic ketoacidosis 11/2010  . Myocardial infarction     Chest pain s/p normal cath in 08/2004 then NSTEMI during 11/2010 admission, negative Myoview  . Diabetes mellitus type 2, insulin dependent   . GERD (gastroesophageal reflux disease)   . Esophageal stricture     Dilation 06/2004  . Hypertension   . Supraventricular tachycardia   . history of  pericarditis 12/2002  . Pericardial effusion   . Rheumatoid arthritis     On MTX and chronic steroids  . Osteoarthritis   . Asthma     Home 3L O2   . Seizure 11/2008  . Rectal bleeding 11/2010    Large hemorrhoids  . Chronic anemia     Related Meds:  Scheduled Meds:   . acetaminophen  650 mg Oral Once  . aztreonam  1 g Intravenous Q8H  . Chlorhexidine Gluconate Cloth  6 each Topical Q0600  . diphenhydrAMINE  25 mg Oral Once  . docusate sodium  100 mg Oral BID  . enoxaparin  40 mg Subcutaneous QHS  . fluticasone  1 puff Inhalation BID  . furosemide  20 mg Intravenous Once  . insulin aspart      . insulin aspart  0-15 Units Subcutaneous Q4H  . insulin detemir  15 Units Subcutaneous Daily  . leflunomide  20 mg Oral Daily  . LORazepam  1 mg Oral Q8H  . metoprolol succinate  100 mg Oral Daily  . metronidazole  500 mg Intravenous Q8H  . mupirocin ointment  1 application Nasal BID  . ondansetron      . predniSONE  10 mg Oral Daily  . senna  1 tablet Oral BID  . vancomycin  500 mg Intravenous Q24H  . DISCONTD: estrogens (conjugated)  0.625 mg Oral Daily   Continuous Infusions:   . sodium chloride 100 mL/hr at 10/01/11 0940   PRN Meds:.acetaminophen, acetaminophen, albuterol, HYDROcodone-acetaminophen, metoprolol, morphine injection, ondansetron (ZOFRAN) IV, promethazine, promethazine, traMADol,  DISCONTD: 0.9 % irrigation (POUR BTL), DISCONTD: fentaNYL, DISCONTD:  morphine injection, DISCONTD: ondansetron   Ht: 4\' 11"  (149.9 cm)  Wt: 128 lb 8.5 oz (58.3 kg)  Ideal Wt: 43.2 kg  % Ideal Wt: 134.7% Wt Readings from Last 10 Encounters:  10/01/11 128 lb 8.5 oz (58.3 kg)  10/01/11 128 lb 8.5 oz (58.3 kg)    Usual Wt: 128 lb per patient % UBW: 100%  Body mass index is 25.96 kg/(m^2). (Overweight)  Food/Nutrition Related Hx: Patient reported a good appetite and intake. She denies any problems chewing or swallowing. Patient dislikes nutrition supplements.   Labs:  CMP     Component Value Date/Time   NA 135 10/01/2011 0335   K 4.4 10/01/2011 0335   CL 103 10/01/2011 0335   CO2 21 10/01/2011 0335   GLUCOSE 118* 10/01/2011 0335   BUN 11 10/01/2011 0335   CREATININE 1.02 10/01/2011 0335   CALCIUM 7.9* 10/01/2011 0335   PROT 5.1* 10/01/2011 0335   ALBUMIN 1.9* 10/01/2011 0335   AST 20 10/01/2011 0335   ALT 11 10/01/2011 0335   ALKPHOS 113 10/01/2011 0335   BILITOT 0.2* 10/01/2011 0335   GFRNONAA 54* 10/01/2011 0335   GFRAA 63* 10/01/2011 0335    Intake/Output Summary (Last 24 hours) at 10/01/11 1505 Last data filed  at 10/01/11 1211  Gross per 24 hour  Intake 3262.5 ml  Output    765 ml  Net 2497.5 ml     Diet Order: Clear Liquid  Supplements/Tube Feeding: none at this time  IVF:    sodium chloride Last Rate: 100 mL/hr at 10/01/11 0940    Estimated Nutritional Needs:   Kcal: 0960-4540 Protein: 70-87 grams Fluid: 1 ml per kcal intake  NUTRITION DIAGNOSIS: -Increased nutrient needs (NI-5.1).  Status: Ongoing  RELATED TO: wound healing  AS EVIDENCE BY: right buttock cellulitis/ abcess  MONITORING/EVALUATION(Goals): Diet advancement, PO intake, skin, weights, labs 1. PO intake > 75% at meals  EDUCATION NEEDS: -No education needs identified at this time  INTERVENTION: 1. Will order patient Prostat supplement BID for wound healing.  2. RD to follow for nutrition  plan of care.   Dietitian [phone]  DOCUMENTATION CODES Per approved criteria  -Not Applicable    Iven Finn [facility] 10/01/2011, 3:04 PM

## 2011-10-01 NOTE — Progress Notes (Signed)
1 Day Post-Op  Subjective: Very tender and painful to touch and dress.  She is getting blood.    Objective: Vital signs in last 24 hours: Temp:  [97.6 F (36.4 C)-101.7 F (38.7 C)] 97.8 F (36.6 C) (06/18 1226) Pulse Rate:  [92-130] 101  (06/18 0500) Resp:  [14-28] 19  (06/18 0500) BP: (95-166)/(36-94) 155/68 mmHg (06/18 1226) SpO2:  [91 %-100 %] 99 % (06/18 0900) Weight:  [58.3 kg (128 lb 8.5 oz)] 58.3 kg (128 lb 8.5 oz) (06/18 0400) Last BM Date: 09/30/11  Nurse reports 2 stools today each of which has soiled her dressing so it's been changed twice so far today. Diet: Clears Temp curve is down, VSS, WBC is still,  Intake/Output from previous day: 06/17 0701 - 06/18 0700 In: 5530 [P.O.:120; I.V.:4810; IV Piggyback:600] Out: 940 [Urine:940] Intake/Output this shift: Total I/O In: 262.5 [I.V.:250; Blood:12.5] Out: -   Incision/Wound:  Large open area going from the base of the rectum up about 5-6 cm.  There is fecal soiling at the base., she has had 2 stools and dressing changes so far today.  Lab Results:   Basename 10/01/11 0335 09/30/11 0351  WBC 21.3* 19.9*  HGB 7.2* 8.4*  HCT 22.8* 26.8*  PLT 342 417*    BMET  Basename 10/01/11 0335 09/30/11 0351  NA 135 130*  K 4.4 4.7  CL 103 95*  CO2 21 21  GLUCOSE 118* 243*  BUN 11 13  CREATININE 1.02 1.07  CALCIUM 7.9* 8.6   PT/INR No results found for this basename: LABPROT:2,INR:2 in the last 72 hours   Lab 10/01/11 0335  AST 20  ALT 11  ALKPHOS 113  BILITOT 0.2*  PROT 5.1*  ALBUMIN 1.9*     Lipase     Component Value Date/Time   LIPASE 22 12/07/2008 0505     Studies/Results: Ct Pelvis W Contrast  09/29/2011  *RADIOLOGY REPORT*  Clinical Data:  Soft tissue infection in the right gluteal region.  CT PELVIS WITH CONTRAST  Technique:  Multidetector CT imaging of the pelvis was performed using the standard protocol following the bolus administration of intravenous contrast.  Contrast: OMNIPAQUE  IOHEXOL 300 MG/ML  SOLN  Comparison:  11/26/2010 MRI.  Findings:  Marked inflammatory changes are present in the dorsal right gluteal subcutaneous fat.  There is no well-defined abscess. No gas is present in the soft tissues.  Inflammatory changes extend up to the right side of the gluteal cleft.  Hysterectomy.  Visceral pelvis grossly appears within normal limits aside from a cystic lesion in the right anatomic pelvis, compatible with a small right ovarian cyst.  There is no inguinal adenopathy.  No iliac adenopathy.  No destructive osseous lesions are present.  Lumbar spondylosis at L4-L5 and L5-S1 is severe.  Bilateral obturator ring nonunion is present.  IMPRESSION:  1.  Phlegmon in the right gluteal soft tissues without abscess or gas.  Findings compatible with superficial soft tissue infection. No extension of the deep pelvic tissues. 2.  Bilateral obturator ring nonunion.  Original Report Authenticated By: Andreas Newport, M.D.    Medications:    . acetaminophen  650 mg Oral Once  . aztreonam  1 g Intravenous Q8H  . Chlorhexidine Gluconate Cloth  6 each Topical Q0600  . diphenhydrAMINE  25 mg Oral Once  . docusate sodium  100 mg Oral BID  . enoxaparin  40 mg Subcutaneous QHS  . fluticasone  1 puff Inhalation BID  . furosemide  20  mg Intravenous Once  . insulin aspart      . insulin aspart  0-15 Units Subcutaneous Q4H  . insulin detemir  15 Units Subcutaneous Daily  . leflunomide  20 mg Oral Daily  . LORazepam  1 mg Oral Q8H  . metoprolol succinate  100 mg Oral Daily  . metronidazole  500 mg Intravenous Q8H  . mupirocin ointment  1 application Nasal BID  . ondansetron      . predniSONE  10 mg Oral Daily  . senna  1 tablet Oral BID  . vancomycin  500 mg Intravenous Q24H  . DISCONTD: estrogens (conjugated)  0.625 mg Oral Daily    Assessment/Plan Complex right perirectal abscess, s/p Complex incision, drainage, and debridement of large right perirectal abscess, 09/30/2011  Dr.  Abbey Chatters. Gram stain shows GM positive cocci. 1. Severe cellulitis right buttocks, probable early abscess, on immunosuppression and steroids.  2. Severe rheumatoid arthritis  3. Insulin-dependent diabetes  4. 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  5.History of SVT  6. History of pericarditis/effusion  7. History of seizure  8. Chronic anemia  9. GERD 10.OSA on home O2   Plan:  BID dressing changes and prn.  She may benefit from [employer], but I'm going to give her some more time before we order it.  You can advance her diet for now as tolerated.  Will let medicine handle that so they can also control her AODM.    LOS: 2 days    JENNINGS,WILLARD 10/01/2011 Wound okay. Dressing changed. Tender but appears well drained.  Wound care may be a problem with this location.

## 2011-10-01 NOTE — Progress Notes (Signed)
CBG: 69  Treatment: sugar drink given Symptoms: none  Follow-up CBG: Time:0820 CBG Result:124 Possible Reasons for Event: minimal eating post surg. Agreed to drink clear liquids more  Comments/MD notified:    Little Ishikawa

## 2011-10-02 DIAGNOSIS — R000 Tachycardia, unspecified: Secondary | ICD-10-CM

## 2011-10-02 DIAGNOSIS — L03317 Cellulitis of buttock: Secondary | ICD-10-CM

## 2011-10-02 DIAGNOSIS — L0231 Cutaneous abscess of buttock: Secondary | ICD-10-CM

## 2011-10-02 DIAGNOSIS — E876 Hypokalemia: Secondary | ICD-10-CM

## 2011-10-02 DIAGNOSIS — R6510 Systemic inflammatory response syndrome (SIRS) of non-infectious origin without acute organ dysfunction: Secondary | ICD-10-CM

## 2011-10-02 LAB — GLUCOSE, CAPILLARY
Glucose-Capillary: 81 mg/dL (ref 70–99)
Glucose-Capillary: 100 mg/dL — ABNORMAL HIGH (ref 70–99)

## 2011-10-02 LAB — BASIC METABOLIC PANEL
CO2: 24 mEq/L (ref 19–32)
Calcium: 8.1 mg/dL — ABNORMAL LOW (ref 8.4–10.5)
Creatinine, Ser: 0.78 mg/dL (ref 0.50–1.10)

## 2011-10-02 LAB — DIFFERENTIAL
Basophils Absolute: 0 10*3/uL (ref 0.0–0.1)
Eosinophils Relative: 1 % (ref 0–5)
Lymphocytes Relative: 7 % — ABNORMAL LOW (ref 12–46)
Neutro Abs: 11.9 10*3/uL — ABNORMAL HIGH (ref 1.7–7.7)
Neutrophils Relative %: 84 % — ABNORMAL HIGH (ref 43–77)

## 2011-10-02 LAB — TYPE AND SCREEN: Unit division: 0

## 2011-10-02 LAB — CBC
MCV: 84.3 fL (ref 78.0–100.0)
Platelets: 375 10*3/uL (ref 150–400)
RDW: 14.6 % (ref 11.5–15.5)
WBC: 14.3 10*3/uL — ABNORMAL HIGH (ref 4.0–10.5)

## 2011-10-02 LAB — MAGNESIUM: Magnesium: 1.4 mg/dL — ABNORMAL LOW (ref 1.5–2.5)

## 2011-10-02 LAB — VANCOMYCIN, TROUGH: Vancomycin Tr: 6.8 ug/mL — ABNORMAL LOW (ref 10.0–20.0)

## 2011-10-02 MED ORDER — MAGNESIUM SULFATE 40 MG/ML IJ SOLN
2.0000 g | Freq: Once | INTRAMUSCULAR | Status: AC
  Administered 2011-10-02: 2 g via INTRAVENOUS
  Filled 2011-10-02: qty 50

## 2011-10-02 MED ORDER — POTASSIUM CHLORIDE CRYS ER 20 MEQ PO TBCR
40.0000 meq | EXTENDED_RELEASE_TABLET | Freq: Two times a day (BID) | ORAL | Status: DC
  Administered 2011-10-02 – 2011-10-04 (×5): 40 meq via ORAL
  Filled 2011-10-02 (×6): qty 2

## 2011-10-02 MED ORDER — POTASSIUM CHLORIDE IN NACL 40-0.9 MEQ/L-% IV SOLN
INTRAVENOUS | Status: DC
  Administered 2011-10-02 – 2011-10-03 (×2): 100 mL/h via INTRAVENOUS
  Administered 2011-10-03 – 2011-10-04 (×2): via INTRAVENOUS
  Filled 2011-10-02 (×6): qty 1000

## 2011-10-02 MED ORDER — POTASSIUM CHLORIDE 10 MEQ/100ML IV SOLN: 10.0000 meq | INTRAVENOUS | Status: DC

## 2011-10-02 MED ORDER — VANCOMYCIN HCL 500 MG IV SOLR
500.0000 mg | Freq: Two times a day (BID) | INTRAVENOUS | Status: DC
  Administered 2011-10-02 – 2011-10-08 (×12): 500 mg via INTRAVENOUS
  Filled 2011-10-02 (×14): qty 500

## 2011-10-02 MED ORDER — DILTIAZEM HCL 30 MG PO TABS
30.0000 mg | ORAL_TABLET | Freq: Four times a day (QID) | ORAL | Status: DC | PRN
  Filled 2011-10-02: qty 1

## 2011-10-02 MED ORDER — POTASSIUM CHLORIDE 2 MEQ/ML IV SOLN: INTRAVENOUS | Status: DC

## 2011-10-02 NOTE — Progress Notes (Signed)
Subjective: Patient seen and examined this am. Still has  Pain over the buttock. Denies any chills.  Objective:  Vital signs in last 24 hours:  Filed Vitals:   10/02/11 1021 10/02/11 1200 10/02/11 1300 10/02/11 1608  BP: 181/89 200/73 170/72   Pulse: 110 109    Temp:  97.7 F (36.5 C)  98 F (36.7 C)  TempSrc:  Oral  Oral  Resp:  20    Height:      Weight:      SpO2:  97%      Intake/Output from previous day:   Intake/Output Summary (Last 24 hours) at 10/02/11 1755 Last data filed at 10/02/11 1500  Gross per 24 hour  Intake   3090 ml  Output   5895 ml  Net  -2805 ml    Physical Exam:  General:elderly fermale in no acute distress. HEENT: no pallor, no icterus, moist oral mucosa, no JVD, no lymphadenopathy Heart:  s1 &s2 tachycardic.   Regular rate and rhythm, without murmurs, rubs, gallops. Lungs: Clear to auscultation bilaterally. Abdomen: Soft, nontender, nondistended, positive bowel sounds. Extremities: dressing over right gluteal  wound. No clubbing cyanosis or edema with positive pedal pulses. Foley in place Neuro: Alert, awake, oriented x3, nonfocal.   Lab Results:  Basic Metabolic Panel:    Component Value Date/Time   NA 135 10/02/2011 1045   K 2.6* 10/02/2011 1045   CL 99 10/02/2011 1045   CO2 24 10/02/2011 1045   BUN 9 10/02/2011 1045   CREATININE 0.78 10/02/2011 1045   GLUCOSE 118* 10/02/2011 1045   CALCIUM 8.1* 10/02/2011 1045   CBC:    Component Value Date/Time   WBC 14.3* 10/02/2011 0535   HGB 9.5* 10/02/2011 0535   HCT 29.5* 10/02/2011 0535   PLT 375 10/02/2011 0535   MCV 84.3 10/02/2011 0535   NEUTROABS 11.9* 10/02/2011 0535   LYMPHSABS 1.0 10/02/2011 0535   MONOABS 1.2* 10/02/2011 0535   EOSABS 0.1 10/02/2011 0535   BASOSABS 0.0 10/02/2011 0535    Recent Results (from the past 240 hour(s))  CULTURE, BLOOD (ROUTINE X 2)     Status: Normal (Preliminary result)   Collection Time   09/29/11  6:28 PM      Component Value Range Status Comment   Specimen Description BLOOD LEFT ARM  5 ML IN [facility] BOTTLE   Final    Special Requests Immunocompromised   Final    Culture  Setup Time [id number]   Final    Culture     Final    Value: GRAM POSITIVE RODS     Note: Gram Stain Report Called to,Read Back By and Verified With: CHERYL DENNY @0309  ON 10/01/2011 BY MCLET   Report Status PENDING   Incomplete   CULTURE, BLOOD (ROUTINE X 2)     Status: Normal (Preliminary result)   Collection Time   09/29/11  6:32 PM      Component Value Range Status Comment   Specimen Description BLOOD RIGHT ARM  4 ML IN [facility] BOTTLE   Final    Special Requests Immunocompromised   Final    Culture  Setup Time [id number]   Final    Culture     Final    Value:        BLOOD CULTURE RECEIVED NO GROWTH TO DATE CULTURE WILL BE HELD FOR 5 DAYS BEFORE ISSUING A FINAL NEGATIVE REPORT   Report Status PENDING   Incomplete   MRSA PCR SCREENING     Status:  Abnormal   Collection Time   09/30/11  8:12 AM      Component Value Range Status Comment   MRSA by PCR POSITIVE (*) NEGATIVE Final   ANAEROBIC CULTURE     Status: Normal (Preliminary result)   Collection Time   09/30/11  8:35 PM      Component Value Range Status Comment   Specimen Description ABSCESS   Final    Special Requests NONE   Final    Gram Stain     Final    Value: RARE WBC PRESENT,BOTH PMN AND MONONUCLEAR     NO SQUAMOUS EPITHELIAL CELLS SEEN     FEW GRAM POSITIVE COCCI IN PAIRS   Culture     Final    Value: NO ANAEROBES ISOLATED; CULTURE IN PROGRESS FOR 5 DAYS   Report Status PENDING   Incomplete   CULTURE, ROUTINE-ABSCESS     Status: Normal (Preliminary result)   Collection Time   09/30/11  8:35 PM      Component Value Range Status Comment   Specimen Description ABSCESS   Final    Special Requests NONE   Final    Gram Stain     Final    Value: RARE WBC PRESENT,BOTH PMN AND MONONUCLEAR     NO SQUAMOUS EPITHELIAL CELLS SEEN     FEW GRAM POSITIVE COCCI IN PAIRS   Culture     Final    Value: MODERATE  STAPHYLOCOCCUS AUREUS     Note: RIFAMPIN AND GENTAMICIN SHOULD NOT BE USED AS SINGLE DRUGS FOR TREATMENT OF STAPH INFECTIONS.   Report Status PENDING   Incomplete   ANAEROBIC CULTURE     Status: Normal (Preliminary result)   Collection Time   09/30/11  8:41 PM      Component Value Range Status Comment   Specimen Description ABSCESS   Final    Special Requests NONE   Final    Gram Stain     Final    Value: ABUNDANT WBC PRESENT,BOTH PMN AND MONONUCLEAR     NO SQUAMOUS EPITHELIAL CELLS SEEN     MODERATE GRAM POSITIVE COCCI IN PAIRS     IN CLUSTERS   Culture     Final    Value: NO ANAEROBES ISOLATED; CULTURE IN PROGRESS FOR 5 DAYS   Report Status PENDING   Incomplete   CULTURE, ROUTINE-ABSCESS     Status: Normal (Preliminary result)   Collection Time   09/30/11  8:41 PM      Component Value Range Status Comment   Specimen Description ABSCESS   Final    Special Requests NONE   Final    Gram Stain     Final    Value: ABUNDANT WBC PRESENT,BOTH PMN AND MONONUCLEAR     NO SQUAMOUS EPITHELIAL CELLS SEEN     MODERATE GRAM POSITIVE COCCI IN PAIRS     IN CLUSTERS   Culture     Final    Value: ABUNDANT STAPHYLOCOCCUS AUREUS     Note: RIFAMPIN AND GENTAMICIN SHOULD NOT BE USED AS SINGLE DRUGS FOR TREATMENT OF STAPH INFECTIONS.   Report Status PENDING   Incomplete     Studies/Results: No results found.  Medications: Scheduled Meds:   . aztreonam  1 g Intravenous Q8H  . Chlorhexidine Gluconate Cloth  6 each Topical Q0600  . docusate sodium  100 mg Oral BID  . enoxaparin  40 mg Subcutaneous QHS  . feeding supplement  30 mL Oral BID PC  . fluticasone  1 puff Inhalation BID  . insulin aspart  0-15 Units Subcutaneous Q4H  . insulin detemir  15 Units Subcutaneous Daily  . leflunomide  20 mg Oral Daily  . LORazepam  1 mg Oral Q8H  . magnesium sulfate 1 - 4 g bolus IVPB  2 g Intravenous Once  . metoprolol succinate  100 mg Oral Daily  . metronidazole  500 mg Intravenous Q8H  . mupirocin  ointment  1 application Nasal BID  . potassium chloride  40 mEq Oral BID  . predniSONE  10 mg Oral Daily  . senna  1 tablet Oral BID  . vancomycin  500 mg Intravenous Q24H  . DISCONTD: potassium chloride  10 mEq Intravenous Q1 Hr x 4   Continuous Infusions:   . 0.9 % NaCl with KCl 40 mEq / L 100 mL/hr (10/02/11 1400)  . DISCONTD: sodium chloride 100 mL/hr (10/02/11 4782)  . DISCONTD: sodium chloride 0.9 % 1,000 mL with potassium chloride 40 mEq infusion     PRN Meds:.acetaminophen, acetaminophen, albuterol, diltiazem, HYDROcodone-acetaminophen, morphine injection, ondansetron (ZOFRAN) IV, promethazine, promethazine, traMADol, DISCONTD: metoprolol  Assessment/Plan:  Sepsis secondary to cellulitis of buttocks  s/p Complex drainage 6/17-afebrile today. leucocytosis improving.  Broadened spectrum of antibiotics Flagyl and aztreonam 6/18 and added IV vanco as cx growing staph. Continue pain control with tramadol 50 mg every 6 when necessary, fentanyl 25-50 mcg, and morphine prn. Monitor for sedation.  Surgery consult following  Tachycardia  note for HR in 140s to 150s. Possibly related to infection, dehydration, low k and mg  replenish k and mg, check in am Cont IV fluids Anemia-likely secondary to acute blood loss from surgery,given 1u PRBC on 6/18. Today improved to 9.5 On toprol xl. 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 Would  keep on beta blocker at present time.. Follow up with cardiology as outpt  Rheumatoid arthritis-continue rheumatoid meds prednisone and less flutamide currently by mouth with a sip of water. Likely will need to have these converted to IV if patient goes to surgery  HTN Continue toprol. holding lisinopril   Diabetes mellitus -probably worsened because she is on chronic steroids Continue lantus  cont SSI  DVT prophylaxis  FULL CODE   LOS: 3 days   Jaaziel Peatross 10/02/2011, 5:55 PM

## 2011-10-02 NOTE — Progress Notes (Signed)
Pt suddenly began crying and acting paranoid. She stated that staff was in the hallway talking re: her husband being in the [employer] ; she was agitated re: not having a bath yet, but when bath offered she stated that anytime was okay with her. [name] asked to speak with her. Pt became more oriented after husband returned to room.  [name] informed of pt's change in behavior.Marisa [name]

## 2011-10-02 NOTE — Progress Notes (Signed)
CRITICAL VALUE ALERT  Critical value received: k+ 2.6  Date of notification:  10/02/11  Time of notification:  1145  Critical value read back:yes  Nurse who received alert:  Anthonette Legato  MD notified (1st page):  Dr. Gonzella Lex  Time of first page:  1210  MD notified (2nd page):Dr.Dhungel  Time of second page:1230 Responding MD: Dr.  Gonzella Lex  Time MD responded:  [phone]

## 2011-10-02 NOTE — Consult Note (Signed)
WOC consult Note Reason for Consult: Assessment and Suggestions for care of Surgical wound Wound type:Surgical Pressure Ulcer POA: No Measurement:13cm x 4cm x 2cm  Wound bed: 50% varying stages of necrotic slough; 50% pink Drainage (amount, consistency, odor) small amount of serous drainage on old dressing Periwound: intact, erythematous with resolving induration Dressing procedure/placement/frequency: Currently is twice daily NS dressings and I agree with this POC. I do not feel that this would is appropriate for NPWT at this time-it's distal margin is too close to the anus to achieve an effective seal.  Patient is with fecal  Incontinence (not diarrhea) at this time; this would further confound NPWT goals and require dressing changes more than is commonly accepted for NPWT therapy.  I believe the best POC at this time is to continue with the twice daily (and PRN fecal incontinence)  dressing changes using NS and perhaps consider pulsatile lavage in a day or so as her comfort level improves (followed by saline dressings).  I will not follow.  Please re-consult if needed. Thanks, Ladona Mow, MSN, RN, [facility] CWOCN [phone])

## 2011-10-02 NOTE — Progress Notes (Addendum)
ANTIBIOTIC CONSULT NOTE   Pharmacy Consult for Vancomycin and Aztreonam Indication: right buttock cellulitis/abscess   Allergies  Allergen Reactions  . Demerol (Meperidine)   . Penicillins Other (See Comments)    Throat swelling   Patient Measurements: Height: 4\' 11"  (149.9 cm) Weight: 132 lb 15 oz (60.3 kg) IBW/kg (Calculated) : 43.2   Vital Signs: Temp: 97.7 F (36.5 C) (06/19 1200) Temp src: Oral (06/19 1200) BP: 181/89 mmHg (06/19 1021) Pulse Rate: 110  (06/19 1021)  Labs:  Basename 10/02/11 1045 10/02/11 0535 10/01/11 0335 09/30/11 0351  WBC -- 14.3* 21.3* 19.9*  HGB -- 9.5* 7.2* 8.4*  PLT -- 375 342 417*  LABCREA -- -- -- --  CREATININE 0.78 -- 1.02 1.07   Estimated Creatinine Clearance: 51.6 ml/min (by C-G formula based on Cr of 0.78). No results found for this basename: VANCOTROUGH:2,VANCOPEAK:2,VANCORANDOM:2,GENTTROUGH:2,GENTPEAK:2,GENTRANDOM:2,TOBRATROUGH:2,TOBRAPEAK:2,TOBRARND:2,AMIKACINPEAK:2,AMIKACINTROU:2,AMIKACIN:2, in the last 72 hours   Microbiology: Recent Results (from the past 720 hour(s))  CULTURE, BLOOD (ROUTINE X 2)     Status: Normal (Preliminary result)   Collection Time   09/29/11  6:28 PM      Component Value Range Status Comment   Specimen Description BLOOD LEFT ARM  5 ML IN [facility] BOTTLE   Final    Special Requests Immunocompromised   Final    Culture  Setup Time [id number]   Final    Culture     Final    Value: GRAM POSITIVE RODS     Note: Gram Stain Report Called to,Read Back By and Verified With: CHERYL DENNY @0309  ON 10/01/2011 BY MCLET   Report Status PENDING   Incomplete   CULTURE, BLOOD (ROUTINE X 2)     Status: Normal (Preliminary result)   Collection Time   09/29/11  6:32 PM      Component Value Range Status Comment   Specimen Description BLOOD RIGHT ARM  4 ML IN [facility] BOTTLE   Final    Special Requests Immunocompromised   Final    Culture  Setup Time [id number]   Final    Culture     Final    Value:        BLOOD CULTURE  RECEIVED NO GROWTH TO DATE CULTURE WILL BE HELD FOR 5 DAYS BEFORE ISSUING A FINAL NEGATIVE REPORT   Report Status PENDING   Incomplete   MRSA PCR SCREENING     Status: Abnormal   Collection Time   09/30/11  8:12 AM      Component Value Range Status Comment   MRSA by PCR POSITIVE (*) NEGATIVE Final   ANAEROBIC CULTURE     Status: Normal (Preliminary result)   Collection Time   09/30/11  8:35 PM      Component Value Range Status Comment   Specimen Description ABSCESS   Final    Special Requests NONE   Final    Gram Stain     Final    Value: RARE WBC PRESENT,BOTH PMN AND MONONUCLEAR     NO SQUAMOUS EPITHELIAL CELLS SEEN     FEW GRAM POSITIVE COCCI IN PAIRS   Culture     Final    Value: NO ANAEROBES ISOLATED; CULTURE IN PROGRESS FOR 5 DAYS   Report Status PENDING   Incomplete   CULTURE, ROUTINE-ABSCESS     Status: Normal (Preliminary result)   Collection Time   09/30/11  8:35 PM      Component Value Range Status Comment   Specimen Description ABSCESS   Final    Special  Requests NONE   Final    Gram Stain     Final    Value: RARE WBC PRESENT,BOTH PMN AND MONONUCLEAR     NO SQUAMOUS EPITHELIAL CELLS SEEN     FEW GRAM POSITIVE COCCI IN PAIRS   Culture     Final    Value: MODERATE STAPHYLOCOCCUS AUREUS     Note: RIFAMPIN AND GENTAMICIN SHOULD NOT BE USED AS SINGLE DRUGS FOR TREATMENT OF STAPH INFECTIONS.   Report Status PENDING   Incomplete   ANAEROBIC CULTURE     Status: Normal (Preliminary result)   Collection Time   09/30/11  8:41 PM      Component Value Range Status Comment   Specimen Description ABSCESS   Final    Special Requests NONE   Final    Gram Stain     Final    Value: ABUNDANT WBC PRESENT,BOTH PMN AND MONONUCLEAR     NO SQUAMOUS EPITHELIAL CELLS SEEN     MODERATE GRAM POSITIVE COCCI IN PAIRS     IN CLUSTERS   Culture     Final    Value: NO ANAEROBES ISOLATED; CULTURE IN PROGRESS FOR 5 DAYS   Report Status PENDING   Incomplete   CULTURE, ROUTINE-ABSCESS     Status:  Normal (Preliminary result)   Collection Time   09/30/11  8:41 PM      Component Value Range Status Comment   Specimen Description ABSCESS   Final    Special Requests NONE   Final    Gram Stain     Final    Value: ABUNDANT WBC PRESENT,BOTH PMN AND MONONUCLEAR     NO SQUAMOUS EPITHELIAL CELLS SEEN     MODERATE GRAM POSITIVE COCCI IN PAIRS     IN CLUSTERS   Culture     Final    Value: ABUNDANT STAPHYLOCOCCUS AUREUS     Note: RIFAMPIN AND GENTAMICIN SHOULD NOT BE USED AS SINGLE DRUGS FOR TREATMENT OF STAPH INFECTIONS.   Report Status PENDING   Incomplete     Medications:  Anti-infectives     Start     Dose/Rate Route Frequency Ordered Stop   09/30/11 1800   vancomycin (VANCOCIN) 500 mg in sodium chloride 0.9 % 100 mL IVPB        500 mg 100 mL/hr over 60 Minutes Intravenous Every 24 hours 09/29/11 2343     09/30/11 1200   metroNIDAZOLE (FLAGYL) IVPB 500 mg        500 mg 100 mL/hr over 60 Minutes Intravenous Every 8 hours 09/30/11 1026     09/30/11 1100   aztreonam (AZACTAM) 1 g in dextrose 5 % 50 mL IVPB        1 g 100 mL/hr over 30 Minutes Intravenous Every 8 hours 09/30/11 1032     09/29/11 1900   vancomycin (VANCOCIN) IVPB 1000 mg/200 mL premix        1,000 mg 200 mL/hr over 60 Minutes Intravenous  Once 09/29/11 1822 09/29/11 1935         Assessment:  70 YOF admit with R buttock cellulitis, SIRS, hyperglycemia.  S/P I&D on 6/17.  Day #4 Vancomycin and Day #3 Aztreonam and Flagyl  Wound culture with Staph aureus (sens pending) and 1/2 blood cultures with GPC - cont to follow.  Goal of Therapy:  Vancomycin trough level 15-20 mcg/ml  Plan:   Continue Aztreonam 1g IV q8h  Continue Vancomycin 500mg  IV q24h.  Measure Vanc trough at steady state.  Follow up renal fxn and culture results.   Lynann Beaver PharmD, BCPS Pager [phone] 10/02/2011 12:26 PM    Addendum (1804)   Vanco trough level= 6.8 (goal15-20) on Vanco 500mg  IV Q24h.  Will change regimen to  Vanco 500mg  IV Q12h and recheck Vanco trough level at steady state or if renal function changes.  Dorethea Clan 10/02/2011

## 2011-10-02 NOTE — Progress Notes (Signed)
2 Days Post-Op  Subjective: Continues to feel better  Objective: Vital signs in last 24 hours: Temp:  [97.6 F (36.4 C)-98.8 F (37.1 C)] 97.6 F (36.4 C) (06/19 0400) Pulse Rate:  [79-105] 95  (06/19 0005) Resp:  [17-23] 21  (06/19 0005) BP: (117-181)/(62-87) 149/62 mmHg (06/19 0005) SpO2:  [95 %-99 %] 97 % (06/19 0005) Weight:  [132 lb 15 oz (60.3 kg)] 132 lb 15 oz (60.3 kg) (06/19 0000) Last BM Date: 10/01/11  Intake/Output from previous day: 06/18 0701 - 06/19 0700 In: 1962.5 [P.O.:200; I.V.:1450; Blood:12.5; IV Piggyback:300] Out: 5095 [Urine:5095] Intake/Output this shift:    General appearance: alert, cooperative and no distress Skin: wound packing changed.  no purulence, induration improving and erythema nearly resolved.    Lab Results:   Basename 10/02/11 0535 10/01/11 0335  WBC 14.3* 21.3*  HGB 9.5* 7.2*  HCT 29.5* 22.8*  PLT 375 342   BMET  Basename 10/01/11 0335 09/30/11 0351  NA 135 130*  K 4.4 4.7  CL 103 95*  CO2 21 21  GLUCOSE 118* 243*  BUN 11 13  CREATININE 1.02 1.07  CALCIUM 7.9* 8.6   PT/INR No results found for this basename: LABPROT:2,INR:2 in the last 72 hours ABG No results found for this basename: PHART:2,PCO2:2,PO2:2,HCO3:2 in the last 72 hours  Studies/Results: No results found.  Anti-infectives: Anti-infectives     Start     Dose/Rate Route Frequency Ordered Stop   09/30/11 1800   vancomycin (VANCOCIN) 500 mg in sodium chloride 0.9 % 100 mL IVPB        500 mg 100 mL/hr over 60 Minutes Intravenous Every 24 hours 09/29/11 2343     09/30/11 1200   metroNIDAZOLE (FLAGYL) IVPB 500 mg        500 mg 100 mL/hr over 60 Minutes Intravenous Every 8 hours 09/30/11 1026     09/30/11 1100   aztreonam (AZACTAM) 1 g in dextrose 5 % 50 mL IVPB        1 g 100 mL/hr over 30 Minutes Intravenous Every 8 hours 09/30/11 1032     09/29/11 1900   vancomycin (VANCOCIN) IVPB 1000 mg/200 mL premix        1,000 mg 200 mL/hr over 60 Minutes  Intravenous  Once 09/29/11 1822 09/29/11 1935          Assessment/Plan: s/p Procedure(s) (LRB): INCISION AND DRAINAGE ABSCESS (Right) wound care consult.  continue dressing changes but she seems to be improving.  LOS: 3 days    Lodema Pilot DAVID 10/02/2011

## 2011-10-03 DIAGNOSIS — R112 Nausea with vomiting, unspecified: Secondary | ICD-10-CM

## 2011-10-03 DIAGNOSIS — E1165 Type 2 diabetes mellitus with hyperglycemia: Secondary | ICD-10-CM

## 2011-10-03 DIAGNOSIS — L0231 Cutaneous abscess of buttock: Secondary | ICD-10-CM

## 2011-10-03 DIAGNOSIS — L03317 Cellulitis of buttock: Secondary | ICD-10-CM

## 2011-10-03 DIAGNOSIS — R000 Tachycardia, unspecified: Secondary | ICD-10-CM

## 2011-10-03 LAB — CULTURE, ROUTINE-ABSCESS

## 2011-10-03 LAB — GLUCOSE, CAPILLARY
Glucose-Capillary: 84 mg/dL (ref 70–99)
Glucose-Capillary: 47 mg/dL — ABNORMAL LOW (ref 70–99)
Glucose-Capillary: 412 mg/dL — ABNORMAL HIGH (ref 70–99)
Glucose-Capillary: 391 mg/dL — ABNORMAL HIGH (ref 70–99)

## 2011-10-03 LAB — HEMOGLOBIN A1C: Hgb A1c MFr Bld: 7.1 % — ABNORMAL HIGH (ref <5.7)

## 2011-10-03 LAB — CBC
MCV: 84.4 fL (ref 78.0–100.0)
Platelets: 382 10*3/uL (ref 150–400)
RBC: 3.72 MIL/uL — ABNORMAL LOW (ref 3.87–5.11)
WBC: 12.4 10*3/uL — ABNORMAL HIGH (ref 4.0–10.5)

## 2011-10-03 LAB — BASIC METABOLIC PANEL
CO2: 28 mEq/L (ref 19–32)
Chloride: 101 mEq/L (ref 96–112)
Potassium: 3.6 mEq/L (ref 3.5–5.1)
Sodium: 136 mEq/L (ref 135–145)

## 2011-10-03 MED ORDER — BOOST / RESOURCE BREEZE PO LIQD
1.0000 | Freq: Every day | ORAL | Status: DC
  Administered 2011-10-03 – 2011-10-07 (×4): 1 via ORAL

## 2011-10-03 NOTE — Progress Notes (Signed)
CBG: 47  Treatment: 15 GM carbohydrate snack  Symptoms: None  Follow-up CBG: Time:0430 CBG Result:91  Possible Reasons for Event: Inadequate meal intake  Comments/MD notified:    Vaughan Sine

## 2011-10-03 NOTE — Progress Notes (Signed)
3 Days Post-Op  Subjective: Feeling better  Objective: Vital signs in last 24 hours: Temp:  [97 F (36.1 C)-98.7 F (37.1 C)] 98.4 F (36.9 C) (06/20 0800) Pulse Rate:  [78-109] 104  (06/20 1100) Resp:  [20-22] 21  (06/20 0400) BP: (153-200)/(72-87) 153/87 mmHg (06/20 1100) SpO2:  [96 %-98 %] 97 % (06/20 0400) Weight:  [135 lb 5.8 oz (61.4 kg)] 135 lb 5.8 oz (61.4 kg) (06/20 0600) Last BM Date: 10/01/11  Intake/Output from previous day: 06/19 0701 - 06/20 0700 In: 3300 [P.O.:750; I.V.:1850; IV Piggyback:700] Out: 3400 [Urine:3400] Intake/Output this shift:    General appearance: cooperative and no distress Skin: dressing changed.  cellulitis resolved. wound is healing okay with wet to dry.  no purulence.    Lab Results:   [facility] 10/03/11 0312 10/02/11 0535  WBC 12.4* 14.3*  HGB 10.0* 9.5*  HCT 31.4* 29.5*  PLT 382 375   BMET  Basename 10/03/11 0312 10/02/11 1045  NA 136 135  K 3.6 2.6*  CL 101 99  CO2 28 24  GLUCOSE 57* 118*  BUN 7 9  CREATININE 0.74 0.78  CALCIUM 8.0* 8.1*   PT/INR No results found for this basename: LABPROT:2,INR:2 in the last 72 hours ABG No results found for this basename: PHART:2,PCO2:2,PO2:2,HCO3:2 in the last 72 hours  Studies/Results: No results found.  Anti-infectives: Anti-infectives     Start     Dose/Rate Route Frequency Ordered Stop   10/02/11 1830   vancomycin (VANCOCIN) 500 mg in sodium chloride 0.9 % 100 mL IVPB        500 mg 100 mL/hr over 60 Minutes Intravenous Every 12 hours 10/02/11 1808     09/30/11 1800   vancomycin (VANCOCIN) 500 mg in sodium chloride 0.9 % 100 mL IVPB  Status:  Discontinued        500 mg 100 mL/hr over 60 Minutes Intravenous Every 24 hours 09/29/11 2343 10/02/11 1808   09/30/11 1200   metroNIDAZOLE (FLAGYL) IVPB 500 mg  Status:  Discontinued        500 mg 100 mL/hr over 60 Minutes Intravenous Every 8 hours 09/30/11 1026 10/03/11 1006   09/30/11 1100   aztreonam (AZACTAM) 1 g in dextrose 5  % 50 mL IVPB  Status:  Discontinued        1 g 100 mL/hr over 30 Minutes Intravenous Every 8 hours 09/30/11 1032 10/03/11 1006   09/29/11 1900   vancomycin (VANCOCIN) IVPB 1000 mg/200 mL premix        1,000 mg 200 mL/hr over 60 Minutes Intravenous  Once 09/29/11 1822 09/29/11 1935          Assessment/Plan: s/p Procedure(s) (LRB): INCISION AND DRAINAGE ABSCESS (Right) abscess appears well drained.  will need dressing changes, wet to dry BID but otherwise looks good.  LOS: 4 days    Marisa [name] 10/03/2011

## 2011-10-03 NOTE — Progress Notes (Signed)
CRITICAL VALUE ALERT  Critical value received: + MRSA in abscess culture  Date of notification: 10/03/11  Time of notification: 0930  Critical value read back:yes  Nurse who received alert:  Anthonette Legato  MD notified (1st page):  Dhungel  Time of first page:  1000  MD notified (2nd page):  Time of second page:  Responding GN:[id number]  Time MD responded: 1000

## 2011-10-03 NOTE — Progress Notes (Signed)
Subjective: Patient informs feeling better today. overnight was hypoglycemic with fsg of 46 but asymptomatic. Improved with som snacks. As per nurse she hasn't been eating mucha and is still on clears. HR better on tele this morning.   Objective:  Vital signs in last 24 hours:  Filed Vitals:   10/03/11 0000 10/03/11 0400 10/03/11 0600 10/03/11 0800  BP: 174/80 173/85    Pulse: 94 78    Temp: 98.1 F (36.7 C) 98.7 F (37.1 C)  98.4 F (36.9 C)  TempSrc: Oral Oral  Oral  Resp: 22 21    Height:      Weight:   61.4 kg (135 lb 5.8 oz)   SpO2: 96% 97%      Intake/Output from previous day:   Intake/Output Summary (Last 24 hours) at 10/03/11 0958 Last data filed at 10/03/11 0600  Gross per 24 hour  Intake   2980 ml  Output   3400 ml  Net   -420 ml    Physical Exam:  General:elderly fermale in no acute distress. Appears fatigued HEENT: no pallor, no icterus, moist oral mucosa, no JVD, no lymphadenopathy  Heart: s1 &s2 normal, HR increases to low 100s on moving.. Regular rate and rhythm, without murmurs, rubs, gallops.  Lungs: Clear to auscultation bilaterally.  Abdomen: Soft, nontender, nondistended, positive bowel sounds.  Extremities: dressing over right gluteal wound which appears clean. No clubbing cyanosis or edema with positive pedal pulses. Foley in place  Neuro: Alert, awake, oriented x3, nonfocal.    Lab Results:  Basic Metabolic Panel:    Component Value Date/Time   NA 136 10/03/2011 0312   K 3.6 10/03/2011 0312   CL 101 10/03/2011 0312   CO2 28 10/03/2011 0312   BUN 7 10/03/2011 0312   CREATININE 0.74 10/03/2011 0312   GLUCOSE 57* 10/03/2011 0312   CALCIUM 8.0* 10/03/2011 0312   CBC:    Component Value Date/Time   WBC 12.4* 10/03/2011 0312   HGB 10.0* 10/03/2011 0312   HCT 31.4* 10/03/2011 0312   PLT 382 10/03/2011 0312   MCV 84.4 10/03/2011 0312   NEUTROABS 11.9* 10/02/2011 0535   LYMPHSABS 1.0 10/02/2011 0535   MONOABS 1.2* 10/02/2011 0535   EOSABS 0.1  10/02/2011 0535   BASOSABS 0.0 10/02/2011 0535    Recent Results (from the past 240 hour(s))  CULTURE, BLOOD (ROUTINE X 2)     Status: Normal (Preliminary result)   Collection Time   09/29/11  6:28 PM      Component Value Range Status Comment   Specimen Description BLOOD LEFT ARM  5 ML IN [facility] BOTTLE   Final    Special Requests Immunocompromised   Final    Culture  Setup Time [id number]   Final    Culture     Final    Value: GRAM POSITIVE RODS     Note: Gram Stain Report Called to,Read Back By and Verified With: CHERYL DENNY @0309  ON 10/01/2011 BY MCLET   Report Status PENDING   Incomplete   CULTURE, BLOOD (ROUTINE X 2)     Status: Normal (Preliminary result)   Collection Time   09/29/11  6:32 PM      Component Value Range Status Comment   Specimen Description BLOOD RIGHT ARM  4 ML IN [facility] BOTTLE   Final    Special Requests Immunocompromised   Final    Culture  Setup Time [id number]   Final    Culture     Final  Value:        BLOOD CULTURE RECEIVED NO GROWTH TO DATE CULTURE WILL BE HELD FOR 5 DAYS BEFORE ISSUING A FINAL NEGATIVE REPORT   Report Status PENDING   Incomplete   MRSA PCR SCREENING     Status: Abnormal   Collection Time   09/30/11  8:12 AM      Component Value Range Status Comment   MRSA by PCR POSITIVE (*) NEGATIVE Final   ANAEROBIC CULTURE     Status: Normal (Preliminary result)   Collection Time   09/30/11  8:35 PM      Component Value Range Status Comment   Specimen Description ABSCESS   Final    Special Requests NONE   Final    Gram Stain     Final    Value: RARE WBC PRESENT,BOTH PMN AND MONONUCLEAR     NO SQUAMOUS EPITHELIAL CELLS SEEN     FEW GRAM POSITIVE COCCI IN PAIRS   Culture     Final    Value: NO ANAEROBES ISOLATED; CULTURE IN PROGRESS FOR 5 DAYS   Report Status PENDING   Incomplete   CULTURE, ROUTINE-ABSCESS     Status: Normal   Collection Time   09/30/11  8:35 PM      Component Value Range Status Comment   Specimen Description ABSCESS   Final     Special Requests NONE   Final    Gram Stain     Final    Value: RARE WBC PRESENT,BOTH PMN AND MONONUCLEAR     NO SQUAMOUS EPITHELIAL CELLS SEEN     FEW GRAM POSITIVE COCCI IN PAIRS   Culture     Final    Value: MODERATE METHICILLIN RESISTANT STAPHYLOCOCCUS AUREUS     Note: RIFAMPIN AND GENTAMICIN SHOULD NOT BE USED AS SINGLE DRUGS FOR TREATMENT OF STAPH INFECTIONS. This organism DOES NOT demonstrate inducible Clindamycin resistance in vitro.     Note: CRITICAL RESULT CALLED TO, READ BACK BY AND VERIFIED WITH: TAMMY BARFIELD @ 9:05AM  10/03/11 BY DWEEKS     Gram Stain Report Called to,Read Back By and Verified With: S.SEEL RN AT [phone] ON 10/03/11 BY C.BONGEL   Report Status 10/03/2011 FINAL   Final    Organism ID, Bacteria METHICILLIN RESISTANT STAPHYLOCOCCUS AUREUS   Final   ANAEROBIC CULTURE     Status: Normal (Preliminary result)   Collection Time   09/30/11  8:41 PM      Component Value Range Status Comment   Specimen Description ABSCESS   Final    Special Requests NONE   Final    Gram Stain     Final    Value: ABUNDANT WBC PRESENT,BOTH PMN AND MONONUCLEAR     NO SQUAMOUS EPITHELIAL CELLS SEEN     MODERATE GRAM POSITIVE COCCI IN PAIRS     IN CLUSTERS   Culture     Final    Value: NO ANAEROBES ISOLATED; CULTURE IN PROGRESS FOR 5 DAYS   Report Status PENDING   Incomplete   CULTURE, ROUTINE-ABSCESS     Status: Normal   Collection Time   09/30/11  8:41 PM      Component Value Range Status Comment   Specimen Description ABSCESS   Final    Special Requests NONE   Final    Gram Stain     Final    Value: ABUNDANT WBC PRESENT,BOTH PMN AND MONONUCLEAR     NO SQUAMOUS EPITHELIAL CELLS SEEN     MODERATE GRAM POSITIVE  COCCI IN PAIRS     IN CLUSTERS   Culture     Final    Value: ABUNDANT METHICILLIN RESISTANT STAPHYLOCOCCUS AUREUS     Note: RIFAMPIN AND GENTAMICIN SHOULD NOT BE USED AS SINGLE DRUGS FOR TREATMENT OF STAPH INFECTIONS. This organism DOES NOT demonstrate inducible Clindamycin  resistance in vitro.     Note: CRITICAL RESULT CALLED TO, READ BACK BY AND VERIFIED WITH: TAMMY BARFIELD @ 9:05AM 10/03/11 BY DWEEKS     Gram Stain Report Called to,Read Back By and Verified With: S.SEEL RN AT [phone] ON 10/03/11 BY C.BONGEL   Report Status 10/03/2011 FINAL   Final    Organism ID, Bacteria METHICILLIN RESISTANT STAPHYLOCOCCUS AUREUS   Final     Studies/Results: No results found.  Medications: Scheduled Meds:   . aztreonam  1 g Intravenous Q8H  . Chlorhexidine Gluconate Cloth  6 each Topical Q0600  . docusate sodium  100 mg Oral BID  . enoxaparin  40 mg Subcutaneous QHS  . feeding supplement  30 mL Oral BID PC  . fluticasone  1 puff Inhalation BID  . insulin aspart  0-15 Units Subcutaneous Q4H  . insulin detemir  15 Units Subcutaneous Daily  . leflunomide  20 mg Oral Daily  . LORazepam  1 mg Oral Q8H  . magnesium sulfate 1 - 4 g bolus IVPB  2 g Intravenous Once  . metoprolol succinate  100 mg Oral Daily  . metronidazole  500 mg Intravenous Q8H  . mupirocin ointment  1 application Nasal BID  . potassium chloride  40 mEq Oral BID  . predniSONE  10 mg Oral Daily  . senna  1 tablet Oral BID  . vancomycin  500 mg Intravenous Q12H  . DISCONTD: potassium chloride  10 mEq Intravenous Q1 Hr x 4  . DISCONTD: vancomycin  500 mg Intravenous Q24H   Continuous Infusions:   . 0.9 % NaCl with KCl 40 mEq / L 100 mL/hr (10/03/11 0757)  . DISCONTD: sodium chloride 100 mL/hr (10/02/11 9604)  . DISCONTD: sodium chloride 0.9 % 1,000 mL with potassium chloride 40 mEq infusion     PRN Meds:.acetaminophen, acetaminophen, albuterol, diltiazem, HYDROcodone-acetaminophen, morphine injection, ondansetron (ZOFRAN) IV, promethazine, promethazine, traMADol, DISCONTD: metoprolol   Assessment/Plan:  Sepsis secondary to cellulitis of buttocks  s/p Complex drainage on 6/17 by surgery -afebrile for past 48 hours. leucocytosis improving.  Broadened spectrum of antibiotics Flagyl and aztreonam  6/18 and added IV vanco as cx growing staph. Final culture growing MRSA which is sensitive to clinda, bactrim and tetracyclines including vanco. Will narrow coverage with vanco only ( day 3 ) and can be transitioned to 10-14 day course of po abx on discharge.  Continue pain control with tramadol 50 mg every 6 h prn, fentanyl 25-50 mcg, and morphine prn. Monitor for sedation.  Surgery consult following . -wound appears to be clean and improving. continue foley for now give concern for soiling of the wound during bowel movements  -will get PT  Tachycardia  note for HR in 140s to 150s on tele on 6/18and 6/19 . Possibly related to infection, dehydration, low k and mg  replenished k and mg,  Cont IV fluids  HR improved this am. Increases to low 100s on movement On toprol xl. Added prn cardizem  No EKG changes  On evaluating previous EKGs patient seems to have chronic tachycardia   TSH wnl  Anemia-likely secondary to acute blood loss from surgery,given 1u PRBC on 6/18. Stable today at  10   H/oNSTEMI in 11/2010 had a normal Myoview and positive troponins thought secondary to acute illness ( DKA)  at that time. Would keep on beta blocker at present time.. Follow up with cardiology as outpt  No further testing neded  Rheumatoid arthritis- Cont with prednisone and leflunamide  HTN  Continue toprol. holding lisinopril   Diabetes mellitus  -noted for low fsg overnight. Her fsg appear to be in 70s and 80s on few occasions. Also she has poor po intake  will hold am dose of levemir. Check A1C. Advance diet to diabetic. Will get nutrition consult cont SSI    DVT prophylaxis   Full code  Will transfer out to tele later int he afternoon if continues to remain stable  LOS: 4 days   Anne Sebring 10/03/2011, 9:58 AM

## 2011-10-03 NOTE — Progress Notes (Signed)
Nutrition Follow-up  Diet Order:  Carb Modified   Patient reported she is doing well with her clear liquids. She reported she is taking her Prostat BID. Discussed patient in rounds. Per MD note patient with poor PO intake. Patient agreed to try Resource breeze nutrition supplement.  Meds: Scheduled Meds:   . Chlorhexidine Gluconate Cloth  6 each Topical Q0600  . docusate sodium  100 mg Oral BID  . enoxaparin  40 mg Subcutaneous QHS  . feeding supplement  30 mL Oral BID PC  . fluticasone  1 puff Inhalation BID  . insulin aspart  0-15 Units Subcutaneous Q4H  . insulin detemir  15 Units Subcutaneous Daily  . leflunomide  20 mg Oral Daily  . LORazepam  1 mg Oral Q8H  . magnesium sulfate 1 - 4 g bolus IVPB  2 g Intravenous Once  . metoprolol succinate  100 mg Oral Daily  . mupirocin ointment  1 application Nasal BID  . potassium chloride  40 mEq Oral BID  . predniSONE  10 mg Oral Daily  . senna  1 tablet Oral BID  . vancomycin  500 mg Intravenous Q12H  . DISCONTD: aztreonam  1 g Intravenous Q8H  . DISCONTD: metronidazole  500 mg Intravenous Q8H  . DISCONTD: vancomycin  500 mg Intravenous Q24H   Continuous Infusions:   . 0.9 % NaCl with KCl 40 mEq / L 100 mL/hr (10/03/11 0757)   PRN Meds:.acetaminophen, acetaminophen, albuterol, diltiazem, HYDROcodone-acetaminophen, morphine injection, ondansetron (ZOFRAN) IV, promethazine, promethazine, traMADol  Labs:  CMP     Component Value Date/Time   NA 136 10/03/2011 0312   K 3.6 10/03/2011 0312   CL 101 10/03/2011 0312   CO2 28 10/03/2011 0312   GLUCOSE 57* 10/03/2011 0312   BUN 7 10/03/2011 0312   CREATININE 0.74 10/03/2011 0312   CALCIUM 8.0* 10/03/2011 0312   PROT 5.1* 10/01/2011 0335   ALBUMIN 1.9* 10/01/2011 0335   AST 20 10/01/2011 0335   ALT 11 10/01/2011 0335   ALKPHOS 113 10/01/2011 0335   BILITOT 0.2* 10/01/2011 0335   GFRNONAA 84* 10/03/2011 0312   GFRAA >90 10/03/2011 0312     Intake/Output Summary (Last 24 hours) at 10/03/11  1438 Last data filed at 10/03/11 0600  Gross per 24 hour  Intake   2010 ml  Output   1800 ml  Net    210 ml    Weight Status:  135 lb.   Re-estimated needs:  Based on weight on 6/18 of 128 lb.. Kcal: 1450-1745  Protein: 70-87 grams Fluid: 1 ml per kcal intake  Nutrition Dx:  Increased nutrient needs. -Continue, Ongoing.   Goal:  1. PO intake > 75% at meals and supplements.   Intervention:  1. Continue prostat BID. Provides 200 kcal and 30 grams of protein.  2. Will order patient resource breeze 1 a day. Provdies 250 kcal and 9 grams of protein daily.   Monitor:  Skin, weights, labs, PO intake   Adron Bene Pager #:  [phone]

## 2011-10-04 ENCOUNTER — Encounter (HOSPITAL_COMMUNITY): Payer: Self-pay

## 2011-10-04 ENCOUNTER — Inpatient Hospital Stay (HOSPITAL_COMMUNITY): Payer: Medicare Other

## 2011-10-04 DIAGNOSIS — E1169 Type 2 diabetes mellitus with other specified complication: Secondary | ICD-10-CM

## 2011-10-04 DIAGNOSIS — L0231 Cutaneous abscess of buttock: Secondary | ICD-10-CM

## 2011-10-04 DIAGNOSIS — L03317 Cellulitis of buttock: Secondary | ICD-10-CM

## 2011-10-04 DIAGNOSIS — E875 Hyperkalemia: Secondary | ICD-10-CM

## 2011-10-04 DIAGNOSIS — R000 Tachycardia, unspecified: Secondary | ICD-10-CM

## 2011-10-04 LAB — GLUCOSE, CAPILLARY
Glucose-Capillary: 107 mg/dL — ABNORMAL HIGH (ref 70–99)
Glucose-Capillary: 213 mg/dL — ABNORMAL HIGH (ref 70–99)
Glucose-Capillary: 80 mg/dL (ref 70–99)

## 2011-10-04 LAB — CBC
HCT: 32.1 % — ABNORMAL LOW (ref 36.0–46.0)
Hemoglobin: 10 g/dL — ABNORMAL LOW (ref 12.0–15.0)
MCH: 27 pg (ref 26.0–34.0)
MCHC: 31 g/dL (ref 30.0–36.0)
MCHC: 31.2 g/dL (ref 30.0–36.0)
MCV: 86.8 fL (ref 78.0–100.0)
Platelets: 448 10*3/uL — ABNORMAL HIGH (ref 150–400)
RDW: 14.7 % (ref 11.5–15.5)

## 2011-10-04 LAB — URINALYSIS, ROUTINE W REFLEX MICROSCOPIC
Glucose, UA: NEGATIVE mg/dL
Nitrite: NEGATIVE
Specific Gravity, Urine: 1.019 (ref 1.005–1.030)
pH: 5 (ref 5.0–8.0)

## 2011-10-04 LAB — BASIC METABOLIC PANEL
BUN: 15 mg/dL (ref 6–23)
BUN: 14 mg/dL (ref 6–23)
CO2: 17 mEq/L — ABNORMAL LOW (ref 19–32)
GFR calc Af Amer: 70 mL/min — ABNORMAL LOW (ref >90)
GFR calc non Af Amer: 60 mL/min — ABNORMAL LOW (ref >90)
GFR calc non Af Amer: 65 mL/min — ABNORMAL LOW (ref >90)
Glucose, Bld: 230 mg/dL — ABNORMAL HIGH (ref 70–99)
Potassium: 6.6 mEq/L (ref 3.5–5.1)
Potassium: 5.4 mEq/L — ABNORMAL HIGH (ref 3.5–5.1)
Sodium: 134 mEq/L — ABNORMAL LOW (ref 135–145)

## 2011-10-04 LAB — URINE MICROSCOPIC-ADD ON

## 2011-10-04 LAB — DIFFERENTIAL
Basophils Relative: 0 % (ref 0–1)
Eosinophils Absolute: 0.2 10*3/uL (ref 0.0–0.7)
Lymphs Abs: 1.2 10*3/uL (ref 0.7–4.0)
Monocytes Absolute: 1.2 10*3/uL — ABNORMAL HIGH (ref 0.1–1.0)
Neutro Abs: 14.2 10*3/uL — ABNORMAL HIGH (ref 1.7–7.7)
Neutrophils Relative %: 85 % — ABNORMAL HIGH (ref 43–77)

## 2011-10-04 LAB — PRO B NATRIURETIC PEPTIDE: Pro B Natriuretic peptide (BNP): 2764 pg/mL — ABNORMAL HIGH (ref 0–125)

## 2011-10-04 MED ORDER — SODIUM POLYSTYRENE SULFONATE 15 GM/60ML PO SUSP
45.0000 g | Freq: Once | ORAL | Status: DC
  Filled 2011-10-04: qty 180

## 2011-10-04 MED ORDER — NYSTATIN 100000 UNIT/GM EX POWD
Freq: Two times a day (BID) | CUTANEOUS | Status: DC
  Administered 2011-10-04 – 2011-10-08 (×9): via TOPICAL
  Filled 2011-10-04: qty 15

## 2011-10-04 MED ORDER — LOPERAMIDE HCL 2 MG PO CAPS
2.0000 mg | ORAL_CAPSULE | ORAL | Status: DC | PRN
  Administered 2011-10-04: 2 mg via ORAL
  Filled 2011-10-04: qty 1

## 2011-10-04 NOTE — Progress Notes (Signed)
4 Days Post-Op  Subjective: She seems to be doing much better going to the floor and talking about home this weekend.  Objective: Vital signs in last 24 hours: Temp:  [97.7 F (36.5 C)-99.9 F (37.7 C)] 99.1 F (37.3 C) (06/21 0800) Pulse Rate:  [84-115] 114  (06/21 0800) Resp:  [18-26] 26  (06/21 0800) BP: (114-156)/(62-94) 151/62 mmHg (06/21 0400) SpO2:  [97 %-100 %] 97 % (06/21 0906) Weight:  [60.8 kg (134 lb 0.6 oz)] 60.8 kg (134 lb 0.6 oz) (06/21 0000) Last BM Date: 10/01/11  Stooling frequently and requires dressing change with it. Temp still in the 99 range, mild tachycardia, WBC is still up  Intake/Output from previous day: 06/20 0701 - 06/21 0700 In: 3580 [P.O.:1080; I.V.:2300; IV Piggyback:200] Out: 1375 [Urine:1375] Intake/Output this shift: Total I/O In: 300 [I.V.:300] Out: 350 [Urine:350]  Incision/Wound: Open area is pretty clean except at the very base near the rectum. Still some stool staining at base of wound  There, it was just changed.  Lab Results:   [facility] 10/04/11 1135 10/04/11 0959  WBC 16.8* 17.5*  HGB 10.0* 10.8*  HCT 32.1* 34.8*  PLT 425* 448*    BMET  Basename 10/04/11 1135 10/04/11 0959  NA 133* 134*  K 5.4* 6.6*  CL 106 104  CO2 17* 15*  GLUCOSE 230* 280*  BUN 14 15  CREATININE 0.88 0.94  CALCIUM 8.3* 8.7   PT/INR No results found for this basename: LABPROT:2,INR:2 in the last 72 hours   Lab 10/01/11 0335  AST 20  ALT 11  ALKPHOS 113  BILITOT 0.2*  PROT 5.1*  ALBUMIN 1.9*     Lipase     Component Value Date/Time   LIPASE 22 12/07/2008 0505     Studies/Results: Dg Chest Port 1 View  10/04/2011  *RADIOLOGY REPORT*  Clinical Data: Shortness of breath.  Tachycardia.  PORTABLE CHEST - 1 VIEW  Comparison: Plain films of the chest 11/26/2010 and 12/06/2008.  CT chest 10/29/2006.  Findings: The lungs are clear.  Heart size is normal.  No pneumothorax or pleural effusion.  IMPRESSION: No acute disease.  Original Report  Authenticated By: Bernadene Bell. Maricela Curet, M.D.    Medications:    . Chlorhexidine Gluconate Cloth  6 each Topical Q0600  . docusate sodium  100 mg Oral BID  . enoxaparin  40 mg Subcutaneous QHS  . feeding supplement  30 mL Oral BID PC  . feeding supplement  1 Container Oral QPC supper  . fluticasone  1 puff Inhalation BID  . insulin aspart  0-15 Units Subcutaneous Q4H  . leflunomide  20 mg Oral Daily  . LORazepam  1 mg Oral Q8H  . metoprolol succinate  100 mg Oral Daily  . mupirocin ointment  1 application Nasal BID  . nystatin   Topical BID  . predniSONE  10 mg Oral Daily  . senna  1 tablet Oral BID  . vancomycin  500 mg Intravenous Q12H  . DISCONTD: insulin detemir  15 Units Subcutaneous Daily  . DISCONTD: potassium chloride  40 mEq Oral BID  . DISCONTD: sodium polystyrene  45 g Oral Once    Assessment/Plan INCISION AND DRAINAGE ABSCESS (Right perirectal abscess . Severe cellulitis right buttocks, probable early abscess, on immunosuppression and steroids.  2. Severe rheumatoid arthritis  3. Insulin-dependent diabetes  4. 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  5.History of SVT  6. History of pericarditis/effusion  7. History of seizure  8. Chronic anemia  9.  GERD  10.OSA on home O2   Plan:  I think it would be good for her to get this wound irrigated, it can be in the shower here and at home, then packed wet to dry.  She will need allot of help with this at home.  I will ask the PT hyrotheraphy folks to see her here and see if she can do therapy here.    LOS: 5 days    Marisa [name],Marisa [name] 10/04/2011  Infection is well drained.  She will need wound care likely home health but when this is set up and otherwise medically stable she can go home.

## 2011-10-04 NOTE — Progress Notes (Signed)
CBG: 48  Treatment: 15 GM carbohydrate snack  Symptoms: None  Follow-up CBG: Time:02:15 CBG Result: 98  Possible Reasons for Event: Medication regimen: novolog q4  Comments/MD notified:    Vaughan Sine

## 2011-10-04 NOTE — Progress Notes (Signed)
CBG: 38  Treatment: 15 GM carbohydrate snack  Symptoms: Nervous/irritable  Follow-up CBG: Time:01:55 CBG Result:48  Possible Reasons for Event: Medication regimen  Comments/MD notified:    Vaughan Sine

## 2011-10-04 NOTE — Progress Notes (Signed)
HYDROTHERAPY NOTE 10/04/11 1600  Subjective Assessment  Subjective OHHH!!   Patient and Family Stated Goals to have wound heal up  Date of Onset 09/22/11  Prior Treatments I and D 09/30/11  Evaluation and Treatment  Evaluation and Treatment Procedures Explained to Patient/Family Yes  Evaluation and Treatment Procedures agreed to  Wound 09/29/11 Other (Comment) Buttocks Right;Mid reddened and tender  Date First Assessed/Time First Assessed: 09/29/11 1443   Wound Type: (c) Other (Comment)  Location: Buttocks  Location Orientation: Right;Mid  Wound Description (Comments): reddened and tender  Present on Admission: Yes  Site / Wound Assessment Pink;Yellow  % Wound base Yellow 15%  % Wound base Other (Comment) 85% (area pink, unsure if actively granulating)  Peri-wound Assessment Pink;Excoriated  Wound Length (cm) 13 cm  Wound Width (cm) 7 cm  Wound Depth (cm) 5 cm (difficult to get accurate dimensions, pt pulls away)  Margins Unattacted edges (unapproximated)  Closure None  Drainage Amount Minimal  Drainage Description Serosanguineous  Non-staged Wound Description Full thickness  Treatment Cleansed  Dressing Type Moist to dry  Dressing Changed Changed  Dressing Status Clean;Intact;Dry  Hydrotherapy  Pulsed Lavage with Suction (psi) 4 psi  Pulsed Lavage Tip Tip with splash shield  Pulsed lavage therapy - wound location attempted at right buttock  pt did not tolerate pulsed lavage  Wound Therapy - Assess/Plan/Recommendations  Wound Therapy - Clinical Statement pt with large wound at right buttock. close to anal opening.  Pt did not tolerate PLS today due to pain and anxiety.  She will need pre med for next session.  Anticipate pt would benefit from wound care at [facility] prior to return home as wound may be too much for husband to manage.  Wound Therapy - Functional Problem List anxiety with dressing change, frequent stooling, RA, dyspnea on exertion  Factors Delaying/Impairing Wound  Healing Infection - systemic/local;Immobility;Multiple medical problems;Other (comment);Diabetes Mellitus;Incontinence (hypoalbuminemia)  Hydrotherapy Plan Dressing change;Patient/family education;Pulsatile lavage with suction  Wound Therapy - Frequency 6X / week  Wound Therapy - Current Recommendations Other (comment) (follow up at [facility])  Wound Therapy - Follow Up Recommendations Other (comment) (LTACH)  Wound Plan contiunue PLS for wound cleansing  Wound Therapy Goals - Improve the function of patient's integumentary system by progressing the wound(s) through the phases of wound healing by:  Decrease Necrotic Tissue to 10%  Decrease Necrotic Tissue - Progress Goal set today  Increase Granulation Tissue to 50%  Increase Granulation Tissue - Progress Goal set today  Patient/Family will be able to  describe home program for dressing changes  Patient/Family Instruction Goal - Progress Goal set today

## 2011-10-04 NOTE — Evaluation (Signed)
Physical Therapy Evaluation Patient Details Name: Marisa [name] MRN: [id number] DOB: 1941-04-14 Today's Date: 10/04/2011 Time: 9562-1308 PT Time Calculation (min): 25 min  PT Assessment / Plan / Recommendation Clinical Impression  pt with history of RA, asthma, steroid dependent, admitted with cellulitis of buttock.  Underwent I and D and found to be + for MRSA. Pt has decreased activity tolerance with increased HR response and increased anxiety with fatigue.  She would benefit from 24/7 assist and follow up PT at d/c    PT Assessment  Patient needs continued PT services    Follow Up Recommendations  LTACH;Home health PT    Barriers to Discharge        lEquipment Recommendations  Defer to next venue    Recommendations for Other Services OT consult   Frequency Min 3X/week    Precautions / Restrictions Restrictions Weight Bearing Restrictions: No   Pertinent Vitals/Pain Pt very dyspneic after ambulation with HR 130, O2 sat 99% on RA      Mobility  Bed Mobility Bed Mobility: Rolling Right;Rolling Left;Supine to Sit;Sit to Supine Rolling Right: 4: Min assist Rolling Left: 4: Min assist Supine to Sit: 4: Min assist Sit to Supine: 4: Min assist Details for Bed Mobility Assistance: pt needs some assist to move legs to edge of bed Transfers Transfers: Sit to Stand;Stand to Sit Sit to Stand: 4: Min assist Stand to Sit: 4: Min assist Details for Transfer Assistance: safety Ambulation/Gait Ambulation/Gait Assistance: 4: Min assist Ambulation Distance (Feet): 30 Feet (15 x2) Assistive device: Rolling walker Ambulation/Gait Assistance Details: pt is able to ambulate with RW.  Some dyspnea on exertion O2 sats 99 % on RA, HR 130 Gait Pattern: Step-through pattern Gait velocity: slow General Gait Details: pt has some imbalance first time up, but is able to move well with RW Stairs: No Wheelchair Mobility Wheelchair Mobility: No    Exercises     PT Diagnosis: Difficulty  walking;Generalized weakness  PT Problem List: Decreased strength;Decreased activity tolerance;Decreased balance;Cardiopulmonary status limiting activity PT Treatment Interventions: DME instruction;Gait training;Functional mobility training;Therapeutic activities;Therapeutic exercise;Balance training;Patient/family education   PT Goals Acute Rehab PT Goals PT Goal Formulation: With patient/family Time For Goal Achievement: 10/18/11 Potential to Achieve Goals: Good Pt will go Supine/Side to Sit: Independently PT Goal: Supine/Side to Sit - Progress: Goal set today Pt will Ambulate: >150 feet;with modified independence;with least restrictive assistive device PT Goal: Ambulate - Progress: Goal set today Pt will Go Up / Down Stairs: 1-2 stairs;with min assist;with rail(s) PT Goal: Up/Down Stairs - Progress: Goal set today  Visit Information  Last PT Received On: 10/04/11 Assistance Needed: +1    Subjective Data  Subjective: I use oxygen at home Patient Stated Goal: to go home   Prior Functioning  Home Living Lives With: Spouse Available Help at Discharge: Family Type of Home: House Home Access: Stairs to enter Secretary/administrator of Steps: 6 Entrance Stairs-Rails: Right;Left Home Layout: One level Home Adaptive Equipment: Straight cane;Walker - rolling Additional Comments: 2 steps in back but that's the "long way around" Prior Function Level of Independence: Independent with assistive device(s);Independent Able to Take Stairs?: Yes Communication Communication: No difficulties    Cognition  Overall Cognitive Status: Appears within functional limits for tasks assessed/performed Arousal/Alertness: Awake/alert Orientation Level: Appears intact for tasks assessed Behavior During Session: Anxious    Extremity/Trunk Assessment Right Lower Extremity Assessment RLE ROM/Strength/Tone: [facility] for tasks assessed;Deficits RLE ROM/Strength/Tone Deficits: RA changes evident with diffuse  muscle atrophy Left Lower Extremity Assessment  LLE ROM/Strength/Tone: [facility] for tasks assessed;Deficits LLE ROM/Strength/Tone Deficits: athritic changes evident with muscle atrophy evident Trunk Assessment Trunk Assessment: Kyphotic   Balance Balance Balance Assessed: No  End of Session PT - End of Session Activity Tolerance: Patient limited by fatigue Patient left: in bed;with family/visitor present Nurse Communication: Mobility status   Donnetta Hail 10/04/2011, 4:30 PM

## 2011-10-04 NOTE — Progress Notes (Addendum)
Subjective: Patient insists on being discharged home. Had another episode of low fsg in 40s overnight but asymptomatic.  HR stable on tele past 24 hrs but elevated to 110s this am after being repositioned. Denies any chest pain, SOB or palpitations. Denies leg swellings. Pain over the buttock better.  Nurse noted for whitish discharge from vagina and also having some loose stool  Objective:  Vital signs in last 24 hours:  Filed Vitals:   10/04/11 0400 10/04/11 0700 10/04/11 0800 10/04/11 0906  BP: 151/62     Pulse: 84 100 114   Temp: 99.7 F (37.6 C) 99.1 F (37.3 C) 99.1 F (37.3 C)   TempSrc: Core (Comment)     Resp: 23 21 26    Height:      Weight:      SpO2: 100% 98% 97% 97%    Intake/Output from previous day:   Intake/Output Summary (Last 24 hours) at 10/04/11 0920 Last data filed at 10/04/11 0800  Gross per 24 hour  Intake   3360 ml  Output   1375 ml  Net   1985 ml    Physical Exam:  General: elderly female  in no acute distress. Appears anxious HEENT: no pallor, no icterus, moist oral mucosa, no JVD, no lymphadenopathy  Heart: s1 &s2 normal, HR increases to low 100s-110s on moving.. Regular rhythm, without murmurs, rubs, gallops.  Lungs: Clear to auscultation bilaterally.  Abdomen: Soft, nontender, nondistended, positive bowel sounds.  Extremities: dressing over right gluteal wound which appears clean. No clubbing cyanosis or edema with positive pedal pulses. Foley in place  Neuro: Alert, awake, oriented x3, nonfocal.   Lab Results:  Basic Metabolic Panel:    Component Value Date/Time   NA 136 10/03/2011 0312   K 3.6 10/03/2011 0312   CL 101 10/03/2011 0312   CO2 28 10/03/2011 0312   BUN 7 10/03/2011 0312   CREATININE 0.74 10/03/2011 0312   GLUCOSE 402* 10/03/2011 1920   CALCIUM 8.0* 10/03/2011 0312   CBC:    Component Value Date/Time   WBC 12.4* 10/03/2011 0312   HGB 10.0* 10/03/2011 0312   HCT 31.4* 10/03/2011 0312   PLT 382 10/03/2011 0312   MCV 84.4  10/03/2011 0312   NEUTROABS 11.9* 10/02/2011 0535   LYMPHSABS 1.0 10/02/2011 0535   MONOABS 1.2* 10/02/2011 0535   EOSABS 0.1 10/02/2011 0535   BASOSABS 0.0 10/02/2011 0535    Recent Results (from the past 240 hour(s))  CULTURE, BLOOD (ROUTINE X 2)     Status: Normal (Preliminary result)   Collection Time   09/29/11  6:28 PM      Component Value Range Status Comment   Specimen Description BLOOD LEFT ARM  5 ML IN [facility] BOTTLE   Final    Special Requests Immunocompromised   Final    Culture  Setup Time [id number]   Final    Culture     Final    Value: GRAM POSITIVE RODS     Note: Gram Stain Report Called to,Read Back By and Verified With: CHERYL DENNY @0309  ON 10/01/2011 BY MCLET   Report Status PENDING   Incomplete   CULTURE, BLOOD (ROUTINE X 2)     Status: Normal (Preliminary result)   Collection Time   09/29/11  6:32 PM      Component Value Range Status Comment   Specimen Description BLOOD RIGHT ARM  4 ML IN [facility] BOTTLE   Final    Special Requests Immunocompromised   Final  Culture  Setup Time [id number]   Final    Culture     Final    Value:        BLOOD CULTURE RECEIVED NO GROWTH TO DATE CULTURE WILL BE HELD FOR 5 DAYS BEFORE ISSUING A FINAL NEGATIVE REPORT   Report Status PENDING   Incomplete   MRSA PCR SCREENING     Status: Abnormal   Collection Time   09/30/11  8:12 AM      Component Value Range Status Comment   MRSA by PCR POSITIVE (*) NEGATIVE Final   ANAEROBIC CULTURE     Status: Normal (Preliminary result)   Collection Time   09/30/11  8:35 PM      Component Value Range Status Comment   Specimen Description ABSCESS   Final    Special Requests NONE   Final    Gram Stain     Final    Value: RARE WBC PRESENT,BOTH PMN AND MONONUCLEAR     NO SQUAMOUS EPITHELIAL CELLS SEEN     FEW GRAM POSITIVE COCCI IN PAIRS   Culture     Final    Value: NO ANAEROBES ISOLATED; CULTURE IN PROGRESS FOR 5 DAYS   Report Status PENDING   Incomplete   CULTURE, ROUTINE-ABSCESS     Status:  Normal   Collection Time   09/30/11  8:35 PM      Component Value Range Status Comment   Specimen Description ABSCESS   Final    Special Requests NONE   Final    Gram Stain     Final    Value: RARE WBC PRESENT,BOTH PMN AND MONONUCLEAR     NO SQUAMOUS EPITHELIAL CELLS SEEN     FEW GRAM POSITIVE COCCI IN PAIRS   Culture     Final    Value: MODERATE METHICILLIN RESISTANT STAPHYLOCOCCUS AUREUS     Note: RIFAMPIN AND GENTAMICIN SHOULD NOT BE USED AS SINGLE DRUGS FOR TREATMENT OF STAPH INFECTIONS. This organism DOES NOT demonstrate inducible Clindamycin resistance in vitro.     Note: CRITICAL RESULT CALLED TO, READ BACK BY AND VERIFIED WITH: TAMMY BARFIELD @ 9:05AM  10/03/11 BY DWEEKS     Gram Stain Report Called to,Read Back By and Verified With: S.SEEL RN AT [phone] ON 10/03/11 BY C.BONGEL   Report Status 10/03/2011 FINAL   Final    Organism ID, Bacteria METHICILLIN RESISTANT STAPHYLOCOCCUS AUREUS   Final   ANAEROBIC CULTURE     Status: Normal (Preliminary result)   Collection Time   09/30/11  8:41 PM      Component Value Range Status Comment   Specimen Description ABSCESS   Final    Special Requests NONE   Final    Gram Stain     Final    Value: ABUNDANT WBC PRESENT,BOTH PMN AND MONONUCLEAR     NO SQUAMOUS EPITHELIAL CELLS SEEN     MODERATE GRAM POSITIVE COCCI IN PAIRS     IN CLUSTERS   Culture     Final    Value: NO ANAEROBES ISOLATED; CULTURE IN PROGRESS FOR 5 DAYS   Report Status PENDING   Incomplete   CULTURE, ROUTINE-ABSCESS     Status: Normal   Collection Time   09/30/11  8:41 PM      Component Value Range Status Comment   Specimen Description ABSCESS   Final    Special Requests NONE   Final    Gram Stain     Final    Value: ABUNDANT WBC  PRESENT,BOTH PMN AND MONONUCLEAR     NO SQUAMOUS EPITHELIAL CELLS SEEN     MODERATE GRAM POSITIVE COCCI IN PAIRS     IN CLUSTERS   Culture     Final    Value: ABUNDANT METHICILLIN RESISTANT STAPHYLOCOCCUS AUREUS     Note: RIFAMPIN AND  GENTAMICIN SHOULD NOT BE USED AS SINGLE DRUGS FOR TREATMENT OF STAPH INFECTIONS. This organism DOES NOT demonstrate inducible Clindamycin resistance in vitro.     Note: CRITICAL RESULT CALLED TO, READ BACK BY AND VERIFIED WITH: TAMMY BARFIELD @ 9:05AM 10/03/11 BY DWEEKS     Gram Stain Report Called to,Read Back By and Verified With: S.SEEL RN AT [phone] ON 10/03/11 BY C.BONGEL   Report Status 10/03/2011 FINAL   Final    Organism ID, Bacteria METHICILLIN RESISTANT STAPHYLOCOCCUS AUREUS   Final     Studies/Results: No results found.  Medications: Scheduled Meds:   . Chlorhexidine Gluconate Cloth  6 each Topical Q0600  . docusate sodium  100 mg Oral BID  . enoxaparin  40 mg Subcutaneous QHS  . feeding supplement  30 mL Oral BID PC  . feeding supplement  1 Container Oral QPC supper  . fluticasone  1 puff Inhalation BID  . insulin aspart  0-15 Units Subcutaneous Q4H  . leflunomide  20 mg Oral Daily  . LORazepam  1 mg Oral Q8H  . metoprolol succinate  100 mg Oral Daily  . mupirocin ointment  1 application Nasal BID  . nystatin   Topical BID  . potassium chloride  40 mEq Oral BID  . predniSONE  10 mg Oral Daily  . senna  1 tablet Oral BID  . vancomycin  500 mg Intravenous Q12H  . DISCONTD: aztreonam  1 g Intravenous Q8H  . DISCONTD: insulin detemir  15 Units Subcutaneous Daily  . DISCONTD: metronidazole  500 mg Intravenous Q8H   Continuous Infusions:   . 0.9 % NaCl with KCl 40 mEq / L 100 mL/hr at 10/04/11 0537   PRN Meds:.acetaminophen, acetaminophen, albuterol, diltiazem, HYDROcodone-acetaminophen, loperamide, morphine injection, ondansetron (ZOFRAN) IV, promethazine, promethazine, traMADol    Assessment/Plan:  Sepsis secondary to cellulitis of buttocks  s/p Complex drainage on 6/17 by surgery -. leucocytosis improving. Noted for low grade temp Broadened spectrum of antibiotics Flagyl and aztreonam 6/18 and added IV vanco as cx growing staph. Final culture growing MRSA which is  sensitive to clinda, bactrim and tetracyclines including vanco. Will narrow coverage with vanco only ( day 4 ) and can be transitioned to 10-14 day course of po abx on discharge.  Continue pain control with tramadol 50 mg every 6 h prn, fentanyl 25-50 mcg, and morphine prn. Monitor for sedation.  Surgery consult following .  -wound appears to be clean and improving. continue foley for now give concern for soiling of the wound during bowel movements . -PT eval pending  Tachycardia  note for HR in 140s to 150s on tele on 6/18and 6/19 . Possibly related to infection, dehydration, low k and mg  replenished k and mg,  Cont IV fluids for now HR improved past 24 hrs but increases to 10s -110s on minimal movement. On toprol xl. Added prn cardizem  No EKG changes noted On evaluating previous EKGs patient seems to have chronic tachycardia  TSH wnl Patient also has some rhonchi on exam , will check CXR and proBNP. Denies any chest pain , SOB or palpitations   Anemia -likely secondary to acute blood loss from surgery,given 1u PRBC  on 6/18. Stable  at 10   H/oNSTEMI in 11/2010  had a normal Myoview and positive troponins thought secondary to acute illness ( DKA) at that time. Would keep on beta blocker at present time.. Follow up with cardiology as outpt  Will check pro BNP. If normal, No further testing neded at this time  Rheumatoid arthritis-  Cont with prednisone and leflunamide   HTN  Continue toprol. holding lisinopril   Diabetes mellitus  -noted for low fsg overnight for past 2 days .  Also she has poor po intake  will hold  levemir for now . Check A1C of 7.1. Advanced diet to diabetic. Appreciate  nutrition consult recs for supplements Will provide bedtime snack to prevent nighttime hypoglycemia cont SSI    DVT prophylaxis  Sq lovenox   transfer out to tele    LOS: 5 days   Aadarsh Cozort 10/04/2011, 9:20 AM   Labs noted for elevated wbc and hyperkalemia with k of 6.6/   Patient asymptomatiC. Was getting kcl 40 meq po bid and 40 meq with IV NS. Repeat k sent stat which showed it to be 5.4 patient having lose BMs so no kayexalate given. Stable on tele except tachycardiac on movement.  Dced kcl Will monitor on tele  repeat k in am  CXR normal  Send UA

## 2011-10-04 NOTE — Progress Notes (Signed)
Inpatient Diabetes Program Recommendations  AACE/ADA: New Consensus Statement on Inpatient Glycemic Control (2009)  Target Ranges:  Prepandial:   less than 140 mg/dL      Peak postprandial:   less than 180 mg/dL (1-2 hours)      Critically ill patients:  140 - 180 mg/dL   Reason for Visit: Hyperglycemia Results for Marisa [name], Marisa [name] (MRN [id number]) as of 10/04/2011 14:17  Ref. Range 10/03/2011 20:55 10/04/2011 00:13 10/04/2011 01:40 10/04/2011 01:59 10/04/2011 02:21 10/04/2011 02:58 10/04/2011 07:53 10/04/2011 12:08  Glucose-Capillary Latest Range: 70-99 mg/dL 725 (H) 73 38 (LL) 48 (L) 98 107 (H) 213 (H) 211 (H)      Note: Pt had hypoglycemia this am and now CBGs are elevated.  No basal insulin ordered.  May benefit from small amt of meal coverage insulin if pt eats >50% meal.  Add Novolog 3 units tidwc if pt eating.  May need adjustment in basal insulin dose for home.

## 2011-10-05 DIAGNOSIS — L03317 Cellulitis of buttock: Secondary | ICD-10-CM

## 2011-10-05 DIAGNOSIS — E1169 Type 2 diabetes mellitus with other specified complication: Secondary | ICD-10-CM

## 2011-10-05 DIAGNOSIS — R6510 Systemic inflammatory response syndrome (SIRS) of non-infectious origin without acute organ dysfunction: Secondary | ICD-10-CM

## 2011-10-05 DIAGNOSIS — L0231 Cutaneous abscess of buttock: Secondary | ICD-10-CM

## 2011-10-05 DIAGNOSIS — R000 Tachycardia, unspecified: Secondary | ICD-10-CM

## 2011-10-05 LAB — CBC
Hemoglobin: 9.5 g/dL — ABNORMAL LOW (ref 12.0–15.0)
MCHC: 32.3 g/dL (ref 30.0–36.0)
Platelets: 397 10*3/uL (ref 150–400)
RBC: 3.42 MIL/uL — ABNORMAL LOW (ref 3.87–5.11)

## 2011-10-05 LAB — GLUCOSE, CAPILLARY
Glucose-Capillary: 209 mg/dL — ABNORMAL HIGH (ref 70–99)
Glucose-Capillary: 97 mg/dL (ref 70–99)
Glucose-Capillary: 206 mg/dL — ABNORMAL HIGH (ref 70–99)
Glucose-Capillary: 277 mg/dL — ABNORMAL HIGH (ref 70–99)
Glucose-Capillary: 239 mg/dL — ABNORMAL HIGH (ref 70–99)
Glucose-Capillary: 200 mg/dL — ABNORMAL HIGH (ref 70–99)
Glucose-Capillary: 265 mg/dL — ABNORMAL HIGH (ref 70–99)

## 2011-10-05 LAB — CULTURE, BLOOD (ROUTINE X 2): Culture  Setup Time: 201306162053

## 2011-10-05 LAB — ANAEROBIC CULTURE

## 2011-10-05 LAB — BASIC METABOLIC PANEL
Calcium: 8.6 mg/dL (ref 8.4–10.5)
GFR calc non Af Amer: 58 mL/min — ABNORMAL LOW (ref >90)
Glucose, Bld: 385 mg/dL — ABNORMAL HIGH (ref 70–99)
Potassium: 4.7 mEq/L (ref 3.5–5.1)
Sodium: 130 mEq/L — ABNORMAL LOW (ref 135–145)

## 2011-10-05 MED ORDER — DILTIAZEM HCL 30 MG PO TABS
30.0000 mg | ORAL_TABLET | Freq: Three times a day (TID) | ORAL | Status: DC
  Administered 2011-10-05 – 2011-10-08 (×10): 30 mg via ORAL
  Filled 2011-10-05 (×12): qty 1

## 2011-10-05 MED ORDER — FUROSEMIDE 10 MG/ML IJ SOLN
40.0000 mg | Freq: Once | INTRAMUSCULAR | Status: AC
  Administered 2011-10-05: 40 mg via INTRAVENOUS
  Filled 2011-10-05: qty 4

## 2011-10-05 MED ORDER — INSULIN ASPART 100 UNIT/ML ~~LOC~~ SOLN
0.0000 [IU] | Freq: Three times a day (TID) | SUBCUTANEOUS | Status: DC
  Administered 2011-10-05: 5 [IU] via SUBCUTANEOUS
  Administered 2011-10-05: 3 [IU] via SUBCUTANEOUS

## 2011-10-05 MED ORDER — DILTIAZEM HCL 30 MG PO TABS
30.0000 mg | ORAL_TABLET | Freq: Four times a day (QID) | ORAL | Status: DC
  Filled 2011-10-05 (×4): qty 1

## 2011-10-05 NOTE — Progress Notes (Signed)
CBG: 97  Treatment: 15 GM carbohydrate snack  Symptoms: None  Follow-up CBG: Time: 0455 CBG Result:  97  Possible Reasons for Event: Medication regimen:   Comments/MD notified:    Brendin Situ L

## 2011-10-05 NOTE — Progress Notes (Addendum)
Subjective: Patient seen and examined this morning. informs feeling better. however still tachycardic on minimal exertion. Low grade temp noted with elevated wbc. As per nurse she also has been having loose bowel movements for past 2 days.   Objective:  Vital signs in last 24 hours:  Filed Vitals:   10/04/11 2047 10/05/11 0619 10/05/11 0837 10/05/11 1419  BP: 189/84 170/80  145/83  Pulse: 109 107  120  Temp: 97.9 F (36.6 C) 98 F (36.7 C)  97.4 F (36.3 C)  TempSrc: Oral Oral  Oral  Resp: 20 20  20   Height:      Weight:      SpO2: 99% 99% 98% 99%    Intake/Output from previous day:   Intake/Output Summary (Last 24 hours) at 10/05/11 1532 Last data filed at 10/05/11 1351  Gross per 24 hour  Intake   1440 ml  Output   1550 ml  Net   -110 ml    Physical Exam:   General: elderly female in no acute distress. Appears anxious  HEENT: no pallor, no icterus, moist oral mucosa, no JVD, no lymphadenopathy  Heart: s1 &s2 normal, HR increases on minimal exertion. Regular rhythm, without murmurs, rubs, gallops.  Lungs: Clear to auscultation bilaterally.  Abdomen: Soft, nontender, nondistended, positive bowel sounds.  Extremities: dressing over right gluteal wound which appears clean. No clubbing cyanosis 1+ pitting edema with positive pedal pulses. Foley in place  Neuro: Alert, awake, oriented x3, nonfocal.   Lab Results:  Basic Metabolic Panel:    Component Value Date/Time   NA 130* 10/05/2011 0940   K 4.7 10/05/2011 0940   CL 98 10/05/2011 0940   CO2 18* 10/05/2011 0940   BUN 15 10/05/2011 0940   CREATININE 0.97 10/05/2011 0940   GLUCOSE 385* 10/05/2011 0940   CALCIUM 8.6 10/05/2011 0940   CBC:    Component Value Date/Time   WBC 17.1* 10/05/2011 0940   HGB 9.5* 10/05/2011 0940   HCT 29.4* 10/05/2011 0940   PLT 397 10/05/2011 0940   MCV 86.0 10/05/2011 0940   NEUTROABS 14.2* 10/04/2011 1135   LYMPHSABS 1.2 10/04/2011 1135   MONOABS 1.2* 10/04/2011 1135   EOSABS 0.2 10/04/2011  1135   BASOSABS 0.0 10/04/2011 1135    Recent Results (from the past 240 hour(s))  CULTURE, BLOOD (ROUTINE X 2)     Status: Normal (Preliminary result)   Collection Time   09/29/11  6:28 PM      Component Value Range Status Comment   Specimen Description BLOOD LEFT ARM  5 ML IN [facility] BOTTLE   Final    Special Requests Immunocompromised   Final    Culture  Setup Time [id number]   Final    Culture     Final    Value: GRAM POSITIVE RODS     Note: Gram Stain Report Called to,Read Back By and Verified With: CHERYL DENNY @0309  ON 10/01/2011 BY MCLET   Report Status PENDING   Incomplete   CULTURE, BLOOD (ROUTINE X 2)     Status: Normal   Collection Time   09/29/11  6:32 PM      Component Value Range Status Comment   Specimen Description BLOOD RIGHT ARM  4 ML IN [facility] BOTTLE   Final    Special Requests Immunocompromised   Final    Culture  Setup Time [id number]   Final    Culture NO GROWTH 5 DAYS   Final    Report Status 10/05/2011 FINAL  Final   MRSA PCR SCREENING     Status: Abnormal   Collection Time   09/30/11  8:12 AM      Component Value Range Status Comment   MRSA by PCR POSITIVE (*) NEGATIVE Final   ANAEROBIC CULTURE     Status: Normal   Collection Time   09/30/11  8:35 PM      Component Value Range Status Comment   Specimen Description ABSCESS   Final    Special Requests NONE   Final    Gram Stain     Final    Value: RARE WBC PRESENT,BOTH PMN AND MONONUCLEAR     NO SQUAMOUS EPITHELIAL CELLS SEEN     FEW GRAM POSITIVE COCCI IN PAIRS   Culture NO ANAEROBES ISOLATED   Final    Report Status 10/05/2011 FINAL   Final   CULTURE, ROUTINE-ABSCESS     Status: Normal   Collection Time   09/30/11  8:35 PM      Component Value Range Status Comment   Specimen Description ABSCESS   Final    Special Requests NONE   Final    Gram Stain     Final    Value: RARE WBC PRESENT,BOTH PMN AND MONONUCLEAR     NO SQUAMOUS EPITHELIAL CELLS SEEN     FEW GRAM POSITIVE COCCI IN PAIRS   Culture      Final    Value: MODERATE METHICILLIN RESISTANT STAPHYLOCOCCUS AUREUS     Note: RIFAMPIN AND GENTAMICIN SHOULD NOT BE USED AS SINGLE DRUGS FOR TREATMENT OF STAPH INFECTIONS. This organism DOES NOT demonstrate inducible Clindamycin resistance in vitro.     Note: CRITICAL RESULT CALLED TO, READ BACK BY AND VERIFIED WITH: TAMMY BARFIELD @ 9:05AM  10/03/11 BY DWEEKS     Gram Stain Report Called to,Read Back By and Verified With: S.SEEL RN AT [phone] ON 10/03/11 BY C.BONGEL   Report Status 10/03/2011 FINAL   Final    Organism ID, Bacteria METHICILLIN RESISTANT STAPHYLOCOCCUS AUREUS   Final   ANAEROBIC CULTURE     Status: Normal   Collection Time   09/30/11  8:41 PM      Component Value Range Status Comment   Specimen Description ABSCESS   Final    Special Requests NONE   Final    Gram Stain     Final    Value: ABUNDANT WBC PRESENT,BOTH PMN AND MONONUCLEAR     NO SQUAMOUS EPITHELIAL CELLS SEEN     MODERATE GRAM POSITIVE COCCI IN PAIRS     IN CLUSTERS   Culture NO ANAEROBES ISOLATED   Final    Report Status 10/05/2011 FINAL   Final   CULTURE, ROUTINE-ABSCESS     Status: Normal   Collection Time   09/30/11  8:41 PM      Component Value Range Status Comment   Specimen Description ABSCESS   Final    Special Requests NONE   Final    Gram Stain     Final    Value: ABUNDANT WBC PRESENT,BOTH PMN AND MONONUCLEAR     NO SQUAMOUS EPITHELIAL CELLS SEEN     MODERATE GRAM POSITIVE COCCI IN PAIRS     IN CLUSTERS   Culture     Final    Value: ABUNDANT METHICILLIN RESISTANT STAPHYLOCOCCUS AUREUS     Note: RIFAMPIN AND GENTAMICIN SHOULD NOT BE USED AS SINGLE DRUGS FOR TREATMENT OF STAPH INFECTIONS. This organism DOES NOT demonstrate inducible Clindamycin resistance in vitro.  Note: CRITICAL RESULT CALLED TO, READ BACK BY AND VERIFIED WITH: TAMMY BARFIELD @ 9:05AM 10/03/11 BY DWEEKS     Gram Stain Report Called to,Read Back By and Verified With: S.SEEL RN AT [phone] ON 10/03/11 BY C.BONGEL   Report Status  10/03/2011 FINAL   Final    Organism ID, Bacteria METHICILLIN RESISTANT STAPHYLOCOCCUS AUREUS   Final     Studies/Results: Dg Chest Port 1 View  10/04/2011  *RADIOLOGY REPORT*  Clinical Data: Shortness of breath.  Tachycardia.  PORTABLE CHEST - 1 VIEW  Comparison: Plain films of the chest 11/26/2010 and 12/06/2008.  CT chest 10/29/2006.  Findings: The lungs are clear.  Heart size is normal.  No pneumothorax or pleural effusion.  IMPRESSION: No acute disease.  Original Report Authenticated By: Bernadene Bell. Maricela Curet, M.D.    Medications: Scheduled Meds:   . diltiazem  30 mg Oral Q8H  . docusate sodium  100 mg Oral BID  . enoxaparin  40 mg Subcutaneous QHS  . feeding supplement  30 mL Oral BID PC  . feeding supplement  1 Container Oral QPC supper  . fluticasone  1 puff Inhalation BID  . furosemide  40 mg Intravenous Once  . insulin aspart  0-15 Units Subcutaneous TID WC  . leflunomide  20 mg Oral Daily  . LORazepam  1 mg Oral Q8H  . metoprolol succinate  100 mg Oral Daily  . mupirocin ointment  1 application Nasal BID  . nystatin   Topical BID  . predniSONE  10 mg Oral Daily  . senna  1 tablet Oral BID  . vancomycin  500 mg Intravenous Q12H  . DISCONTD: diltiazem  30 mg Oral Q6H  . DISCONTD: insulin aspart  0-15 Units Subcutaneous Q4H   Continuous Infusions:  PRN Meds:.acetaminophen, acetaminophen, albuterol, HYDROcodone-acetaminophen, morphine injection, ondansetron (ZOFRAN) IV, promethazine, traMADol, DISCONTD: diltiazem, DISCONTD: loperamide   Assessment/Plan:  Sepsis secondary to cellulitis of buttocks  s/p Complex drainage on 6/17 by surgery -.    Final culture growing MRSA which is sensitive to clinda, bactrim and tetracyclines including vanco. Will narrow coverage with vanco only ( day 5 ) and can be transitioned to 10-14 day course of po abx on discharge.  Continue pain control with tramadol 50 mg every 6 h prn, fentanyl 25-50 mcg, and morphine prn. Monitor for sedation.    Surgery consult following .  -wound appears to be clean and improving. continue foley for now give concern for soiling of the wound during bowel movements .  -PT eval recommends LTAC vs home health with PT depending upon progression  Tachycardia  note for HR in 140s to 150s for past 3 days . Possibly related to infection, dehydration, low k and mg which have been replenished Off fluids, appears fluid overloaded today with pitting edema  HR improved past 24 hrs but increases to 10s -110s on minimal movement.  On toprol xl. prn cardizem , switched to scheduled No EKG changes noted  On evaluating previous EKGs patient seems to have chronic tachycardia  TSH wnl  CXR wnl. Pro BNP elevated . will get  2d echo Patient  has increasing leucocytosis, UA suggests UTI, urine cx sent. Will send for c diff Ordered a dose of IV lasix and monitor for improvement   Anemia  -likely secondary to acute blood loss from surgery,given 1u PRBC on 6/18. Stable at 10   H/oNSTEMI in 11/2010  had a normal Myoview and positive troponins thought secondary to acute illness ( DKA) at  that time. Would keep on beta blocker at present time.. Follow up with cardiology as outpt  Elevated Pro BP with persistent tachycardia  2d echo ordered.   Rheumatoid arthritis-  Cont with prednisone and leflunamide   HTN  Continue toprol. holding lisinopril   Diabetes mellitus  Has been having low fsg during the night. holding levemir. Changed q 4hr fsg monitoring to TID with meals and  QHS. No bedtime coverage. .  A1C of 7.1. Advanced diet to diabetic. Appreciate nutrition consult recs for supplements  provide bedtime snack to prevent nighttime hypoglycemia  cont SSI    Hyperkalemia dced supplements. Now resolved  DVT prophylaxis  Sq lovenox   Monitor in tele. Will get cardiology eval if continues to be tachycardic despite further workup  Full  code     LOS: 6 days   Brendaly Townsel 10/05/2011, 3:32 PM

## 2011-10-05 NOTE — Progress Notes (Signed)
HYDROTHERAPY NOTE 10/05/11 1214  Subjective Assessment  Subjective pt feels better with morphine premed  Patient and Family Stated Goals to have wound heal up  Date of Onset 09/22/11  Prior Treatments I and D 09/30/11  Evaluation and Treatment  Evaluation and Treatment Procedures Explained to Patient/Family Yes  Evaluation and Treatment Procedures agreed to  Wound 09/29/11 Other (Comment) Buttocks Right;Mid reddened and tender  Date First Assessed/Time First Assessed: 09/29/11 1443   Wound Type: (c) Other (Comment)  Location: Buttocks  Location Orientation: Right;Mid  Wound Description (Comments): reddened and tender  Present on Admission: Yes  Site / Wound Assessment Pink;Yellow  % Wound base Yellow 15%  % Wound base Other (Comment) 85% (area is brighter pink/some red after PLS.  Appears healthier)  Peri-wound Assessment Pink;Excoriated  Margins Unattacted edges (unapproximated)  Closure None  Drainage Amount Minimal  Drainage Description Serosanguineous  Non-staged Wound Description Full thickness  Dressing Type Moist to dry;Silicone dressing (applied allevyn to avoid irritation from tape dressings)  Dressing Status Clean;Intact;Dry  Hydrotherapy  Pulsed Lavage with Suction (psi) 4 psi  Pulsed Lavage with Suction - Normal Saline Used 1000 mL  Pulsed Lavage Tip Tip with splash shield  Pulsed lavage therapy - wound location PLS to right buttock  Wound Therapy - Assess/Plan/Recommendations  Wound Therapy - Clinical Statement better tolerance of PLS with morphine premed.  Pt with some yellow stinginess throughout, but pink areas appreared heallthier after PLS.  Tried allevyn silicone dressing over both buttock areas  Wound Therapy - Functional Problem List anxiety with dressing change, frequent stooling, RA, dyspnea on exertion  Factors Delaying/Impairing Wound Healing Infection - systemic/local;Immobility;Multiple medical problems;Other (comment);Diabetes Mellitus;Incontinence    Hydrotherapy Plan Dressing change;Patient/family education;Pulsatile lavage with suction  Wound Therapy - Frequency 6X / week  Wound Therapy - Follow Up Recommendations Other (comment) (LTACH)  Wound Plan contiunue PLS for wound cleansing  Wound Therapy Goals - Improve the function of patient's integumentary system by progressing the wound(s) through the phases of wound healing by:  Decrease Necrotic Tissue - Progress Progressing toward goal  Increase Granulation Tissue - Progress Progressing toward goal

## 2011-10-05 NOTE — Progress Notes (Signed)
CBG: 44  Treatment: 15 GM carbohydrate snack  Symptoms: Sweaty  Follow-up CBG: Time: CBG Result:  Possible Reasons for Event: Medication regimen: q 4 hour Novolog  Comments/MD notified:    Wood Novacek L

## 2011-10-05 NOTE — Progress Notes (Signed)
Gave pt another 15 gm CHO snack (4 oz of OJ and graham crackers with PB)  after BS came up to 97 since too early for breakfast.  Rechecked pt's CBG and it was 206.  Will continue to monitor pt.

## 2011-10-05 NOTE — Progress Notes (Signed)
ANTIBIOTIC CONSULT NOTE   Pharmacy Consult for Vancomycin and Aztreonam Indication: right buttock cellulitis/abscess   Allergies  Allergen Reactions  . Demerol (Meperidine)   . Penicillins Other (See Comments)    Throat swelling   Patient Measurements: Height: 4\' 11"  (149.9 cm) Weight: 134 lb 0.6 oz (60.8 kg) IBW/kg (Calculated) : 43.2   Vital Signs: Temp: 98 F (36.7 C) (06/22 0619) Temp src: Oral (06/22 0619) BP: 170/80 mmHg (06/22 0619) Pulse Rate: 107  (06/22 0619)  Labs:  Basename 10/04/11 1135 10/04/11 0959 10/03/11 0312  WBC 16.8* 17.5* 12.4*  HGB 10.0* 10.8* 10.0*  PLT 425* 448* 382  LABCREA -- -- --  CREATININE 0.88 0.94 0.74   Estimated Creatinine Clearance: 47.1 ml/min (by C-G formula based on Cr of 0.88).  Basename 10/02/11 1715  VANCOTROUGH 6.8*  VANCOPEAK --  Drue Dun --  GENTTROUGH --  GENTPEAK --  GENTRANDOM --  TOBRATROUGH --  TOBRAPEAK --  TOBRARND --  AMIKACINPEAK --  AMIKACINTROU --  AMIKACIN --     Microbiology: Recent Results (from the past 720 hour(s))  CULTURE, BLOOD (ROUTINE X 2)     Status: Normal (Preliminary result)   Collection Time   09/29/11  6:28 PM      Component Value Range Status Comment   Specimen Description BLOOD LEFT ARM  5 ML IN [facility] BOTTLE   Final    Special Requests Immunocompromised   Final    Culture  Setup Time [id number]   Final    Culture     Final    Value: GRAM POSITIVE RODS     Note: Gram Stain Report Called to,Read Back By and Verified With: CHERYL DENNY @0309  ON 10/01/2011 BY MCLET   Report Status PENDING   Incomplete   CULTURE, BLOOD (ROUTINE X 2)     Status: Normal   Collection Time   09/29/11  6:32 PM      Component Value Range Status Comment   Specimen Description BLOOD RIGHT ARM  4 ML IN [facility] BOTTLE   Final    Special Requests Immunocompromised   Final    Culture  Setup Time [id number]   Final    Culture NO GROWTH 5 DAYS   Final    Report Status 10/05/2011 FINAL   Final   MRSA PCR  SCREENING     Status: Abnormal   Collection Time   09/30/11  8:12 AM      Component Value Range Status Comment   MRSA by PCR POSITIVE (*) NEGATIVE Final   ANAEROBIC CULTURE     Status: Normal (Preliminary result)   Collection Time   09/30/11  8:35 PM      Component Value Range Status Comment   Specimen Description ABSCESS   Final    Special Requests NONE   Final    Gram Stain     Final    Value: RARE WBC PRESENT,BOTH PMN AND MONONUCLEAR     NO SQUAMOUS EPITHELIAL CELLS SEEN     FEW GRAM POSITIVE COCCI IN PAIRS   Culture     Final    Value: NO ANAEROBES ISOLATED; CULTURE IN PROGRESS FOR 5 DAYS   Report Status PENDING   Incomplete   CULTURE, ROUTINE-ABSCESS     Status: Normal   Collection Time   09/30/11  8:35 PM      Component Value Range Status Comment   Specimen Description ABSCESS   Final    Special Requests NONE   Final  Gram Stain     Final    Value: RARE WBC PRESENT,BOTH PMN AND MONONUCLEAR     NO SQUAMOUS EPITHELIAL CELLS SEEN     FEW GRAM POSITIVE COCCI IN PAIRS   Culture     Final    Value: MODERATE METHICILLIN RESISTANT STAPHYLOCOCCUS AUREUS     Note: RIFAMPIN AND GENTAMICIN SHOULD NOT BE USED AS SINGLE DRUGS FOR TREATMENT OF STAPH INFECTIONS. This organism DOES NOT demonstrate inducible Clindamycin resistance in vitro.     Note: CRITICAL RESULT CALLED TO, READ BACK BY AND VERIFIED WITH: TAMMY BARFIELD @ 9:05AM  10/03/11 BY DWEEKS     Gram Stain Report Called to,Read Back By and Verified With: S.SEEL RN AT [phone] ON 10/03/11 BY C.BONGEL   Report Status 10/03/2011 FINAL   Final    Organism ID, Bacteria METHICILLIN RESISTANT STAPHYLOCOCCUS AUREUS   Final   ANAEROBIC CULTURE     Status: Normal (Preliminary result)   Collection Time   09/30/11  8:41 PM      Component Value Range Status Comment   Specimen Description ABSCESS   Final    Special Requests NONE   Final    Gram Stain     Final    Value: ABUNDANT WBC PRESENT,BOTH PMN AND MONONUCLEAR     NO SQUAMOUS EPITHELIAL  CELLS SEEN     MODERATE GRAM POSITIVE COCCI IN PAIRS     IN CLUSTERS   Culture     Final    Value: NO ANAEROBES ISOLATED; CULTURE IN PROGRESS FOR 5 DAYS   Report Status PENDING   Incomplete   CULTURE, ROUTINE-ABSCESS     Status: Normal   Collection Time   09/30/11  8:41 PM      Component Value Range Status Comment   Specimen Description ABSCESS   Final    Special Requests NONE   Final    Gram Stain     Final    Value: ABUNDANT WBC PRESENT,BOTH PMN AND MONONUCLEAR     NO SQUAMOUS EPITHELIAL CELLS SEEN     MODERATE GRAM POSITIVE COCCI IN PAIRS     IN CLUSTERS   Culture     Final    Value: ABUNDANT METHICILLIN RESISTANT STAPHYLOCOCCUS AUREUS     Note: RIFAMPIN AND GENTAMICIN SHOULD NOT BE USED AS SINGLE DRUGS FOR TREATMENT OF STAPH INFECTIONS. This organism DOES NOT demonstrate inducible Clindamycin resistance in vitro.     Note: CRITICAL RESULT CALLED TO, READ BACK BY AND VERIFIED WITH: TAMMY BARFIELD @ 9:05AM 10/03/11 BY DWEEKS     Gram Stain Report Called to,Read Back By and Verified With: S.SEEL RN AT [phone] ON 10/03/11 BY C.BONGEL   Report Status 10/03/2011 FINAL   Final    Organism ID, Bacteria METHICILLIN RESISTANT STAPHYLOCOCCUS AUREUS   Final     Medications:  Anti-infectives     Start     Dose/Rate Route Frequency Ordered Stop   10/02/11 1830   vancomycin (VANCOCIN) 500 mg in sodium chloride 0.9 % 100 mL IVPB        500 mg 100 mL/hr over 60 Minutes Intravenous Every 12 hours 10/02/11 1808     09/30/11 1800   vancomycin (VANCOCIN) 500 mg in sodium chloride 0.9 % 100 mL IVPB  Status:  Discontinued        500 mg 100 mL/hr over 60 Minutes Intravenous Every 24 hours 09/29/11 2343 10/02/11 1808   09/30/11 1200   metroNIDAZOLE (FLAGYL) IVPB 500 mg  Status:  Discontinued  500 mg 100 mL/hr over 60 Minutes Intravenous Every 8 hours 09/30/11 1026 10/03/11 1006   09/30/11 1100   aztreonam (AZACTAM) 1 g in dextrose 5 % 50 mL IVPB  Status:  Discontinued        1 g 100 mL/hr  over 30 Minutes Intravenous Every 8 hours 09/30/11 1032 10/03/11 1006   09/29/11 1900   vancomycin (VANCOCIN) IVPB 1000 mg/200 mL premix        1,000 mg 200 mL/hr over 60 Minutes Intravenous  Once 09/29/11 1822 09/29/11 1935         Assessment:  70 YOF admit with R buttock cellulitis, SIRS, hyperglycemia.  S/P I&D on 6/17.  Day #7 Vancomycin for MRSA wound  Elevated WBC (on prednisone)  Goal of Therapy:  Vancomycin trough level 15-20 mcg/ml  Plan:   Continue Vancomycin 500mg  IV q12h.  What is plan for vancomycin? Change to PO abx soon?  Will recheck a trough as necessary   Hessie Knows, PharmD, BCPS Pager [phone] 10/05/2011 9:33 AM

## 2011-10-05 NOTE — Progress Notes (Signed)
General Surgery Note  LOS: 6 days  POD# 5 Room - 1442  Assessment/Plan:  1.  INCISION AND DRAINAGE right perirectal abscess - T. Rosenbower - 09/30/2011  MRSA  WBC - 17,100 - 10/05/2011  Wound is drained, but still needs some work to clean it up.  She does not tolerate the pulse lavage well, but that would probably be best to clean up the wound.   2.  Rheumatoid arthritis 3.  COPD 4.  Diabetes mellitus type 2, insulin dependent   Glucose - 385 - 10/05/2011  Poor control - probably affecting the wound healing.   5.  Chronic anemia  Hgb - 9.5 - 10/05/2011 6.  OSA - on home O2 7.  DVT prophylaxis - Lovenox 8.  On chronic steroids - Prednisone 10 mg QD  Subjective:  Doing okay, though still a lot of pain with manipulation of buttocks wound. Objective:   Filed Vitals:   10/05/11 0619  BP: 170/80  Pulse: 107  Temp: 98 F (36.7 C)  Resp: 20     Intake/Output from previous day:  06/21 0701 - 06/22 0700 In: 1260 [P.O.:760; I.V.:300; IV Piggyback:200] Out: 1450 [Urine:1450]  Intake/Output this shift:     Physical Exam:   General: WN older WF who is alert and oriented.    HEENT: Normal. Pupils equal.   Wound: Appox 6 x 10 cm open wound with no obvious trapped infection.   Lab Results:    Basename 10/05/11 0940 10/04/11 1135  WBC 17.1* 16.8*  HGB 9.5* 10.0*  HCT 29.4* 32.1*  PLT 397 425*    BMET   Basename 10/05/11 0940 10/04/11 1135  NA 130* 133*  K 4.7 5.4*  CL 98 106  CO2 18* 17*  GLUCOSE 385* 230*  BUN 15 14  CREATININE 0.97 0.88  CALCIUM 8.6 8.3*    PT/INR  No results found for this basename: LABPROT:2,INR:2 in the last 72 hours  ABG  No results found for this basename: PHART:2,PCO2:2,PO2:2,HCO3:2 in the last 72 hours   Studies/Results:  Dg Chest Port 1 View  10/04/2011  *RADIOLOGY REPORT*  Clinical Data: Shortness of breath.  Tachycardia.  PORTABLE CHEST - 1 VIEW  Comparison: Plain films of the chest 11/26/2010 and 12/06/2008.  CT chest 10/29/2006.   Findings: The lungs are clear.  Heart size is normal.  No pneumothorax or pleural effusion.  IMPRESSION: No acute disease.  Original Report Authenticated By: Bernadene Bell. Maricela Curet, M.D.     Anti-infectives:   Anti-infectives     Start     Dose/Rate Route Frequency Ordered Stop   10/02/11 1830   vancomycin (VANCOCIN) 500 mg in sodium chloride 0.9 % 100 mL IVPB        500 mg 100 mL/hr over 60 Minutes Intravenous Every 12 hours 10/02/11 1808     09/30/11 1800   vancomycin (VANCOCIN) 500 mg in sodium chloride 0.9 % 100 mL IVPB  Status:  Discontinued        500 mg 100 mL/hr over 60 Minutes Intravenous Every 24 hours 09/29/11 2343 10/02/11 1808   09/30/11 1200   metroNIDAZOLE (FLAGYL) IVPB 500 mg  Status:  Discontinued        500 mg 100 mL/hr over 60 Minutes Intravenous Every 8 hours 09/30/11 1026 10/03/11 1006   09/30/11 1100   aztreonam (AZACTAM) 1 g in dextrose 5 % 50 mL IVPB  Status:  Discontinued        1 g 100 mL/hr over 30 Minutes Intravenous  Every 8 hours 09/30/11 1032 10/03/11 1006   09/29/11 1900   vancomycin (VANCOCIN) IVPB 1000 mg/200 mL premix        1,000 mg 200 mL/hr over 60 Minutes Intravenous  Once 09/29/11 1822 09/29/11 1935          Ovidio Kin, MD, FACS Pager: [phone],   [facility] Office: [phone] 10/05/2011

## 2011-10-06 DIAGNOSIS — E1165 Type 2 diabetes mellitus with hyperglycemia: Secondary | ICD-10-CM

## 2011-10-06 DIAGNOSIS — R000 Tachycardia, unspecified: Secondary | ICD-10-CM

## 2011-10-06 DIAGNOSIS — L0231 Cutaneous abscess of buttock: Secondary | ICD-10-CM

## 2011-10-06 DIAGNOSIS — L03317 Cellulitis of buttock: Secondary | ICD-10-CM

## 2011-10-06 DIAGNOSIS — E1010 Type 1 diabetes mellitus with ketoacidosis without coma: Secondary | ICD-10-CM

## 2011-10-06 DIAGNOSIS — I517 Cardiomegaly: Secondary | ICD-10-CM

## 2011-10-06 LAB — BASIC METABOLIC PANEL
BUN: 21 mg/dL (ref 6–23)
CO2: 22 mEq/L (ref 19–32)
CO2: 21 mEq/L (ref 19–32)
Calcium: 8.6 mg/dL (ref 8.4–10.5)
Calcium: 8.5 mg/dL (ref 8.4–10.5)
Chloride: 98 mEq/L (ref 96–112)
Chloride: 100 mEq/L (ref 96–112)
Creatinine, Ser: 1.15 mg/dL — ABNORMAL HIGH (ref 0.50–1.10)
Creatinine, Ser: 1.07 mg/dL (ref 0.50–1.10)
GFR calc Af Amer: 61 mL/min — ABNORMAL LOW (ref >90)
GFR calc Af Amer: 55 mL/min — ABNORMAL LOW (ref >90)
GFR calc Af Amer: 60 mL/min — ABNORMAL LOW (ref >90)
GFR calc non Af Amer: 53 mL/min — ABNORMAL LOW (ref >90)
Glucose, Bld: 574 mg/dL (ref 70–99)
Glucose, Bld: 176 mg/dL — ABNORMAL HIGH (ref 70–99)
Potassium: 4.9 mEq/L (ref 3.5–5.1)
Potassium: 3.5 mEq/L (ref 3.5–5.1)
Potassium: 4.4 mEq/L (ref 3.5–5.1)
Sodium: 130 mEq/L — ABNORMAL LOW (ref 135–145)
Sodium: 133 mEq/L — ABNORMAL LOW (ref 135–145)
Sodium: 130 mEq/L — ABNORMAL LOW (ref 135–145)

## 2011-10-06 LAB — GLUCOSE, CAPILLARY
Glucose-Capillary: 401 mg/dL — ABNORMAL HIGH (ref 70–99)
Glucose-Capillary: 227 mg/dL — ABNORMAL HIGH (ref 70–99)
Glucose-Capillary: 180 mg/dL — ABNORMAL HIGH (ref 70–99)
Glucose-Capillary: 193 mg/dL — ABNORMAL HIGH (ref 70–99)
Glucose-Capillary: 152 mg/dL — ABNORMAL HIGH (ref 70–99)
Glucose-Capillary: 148 mg/dL — ABNORMAL HIGH (ref 70–99)

## 2011-10-06 LAB — KETONES, QUALITATIVE

## 2011-10-06 LAB — MAGNESIUM: Magnesium: 1.7 mg/dL (ref 1.5–2.5)

## 2011-10-06 LAB — BLOOD GAS, VENOUS
O2 Content: 1 L/min
pCO2, Ven: 38.3 mmHg — ABNORMAL LOW (ref 45.0–50.0)
pH, Ven: 7.35 — ABNORMAL HIGH (ref 7.250–7.300)
pO2, Ven: 44 mmHg (ref 30.0–45.0)

## 2011-10-06 LAB — URINE CULTURE

## 2011-10-06 LAB — CBC
Hemoglobin: 9.7 g/dL — ABNORMAL LOW (ref 12.0–15.0)
MCHC: 31.6 g/dL (ref 30.0–36.0)
Platelets: 469 10*3/uL — ABNORMAL HIGH (ref 150–400)
RBC: 3.55 MIL/uL — ABNORMAL LOW (ref 3.87–5.11)

## 2011-10-06 LAB — KETONES, URINE: Ketones, ur: 40 mg/dL — AB

## 2011-10-06 LAB — OSMOLALITY: Osmolality: 299 mOsm/kg (ref 275–300)

## 2011-10-06 LAB — CLOSTRIDIUM DIFFICILE BY PCR: Toxigenic C. Difficile by PCR: NEGATIVE

## 2011-10-06 MED ORDER — SODIUM CHLORIDE 0.9 % IV SOLN: INTRAVENOUS | Status: DC

## 2011-10-06 MED ORDER — SODIUM CHLORIDE 0.9 % IV SOLN
INTRAVENOUS | Status: DC
  Administered 2011-10-06: 10:00:00 via INTRAVENOUS
  Administered 2011-10-07: 100 mL/h via INTRAVENOUS
  Administered 2011-10-08: 02:00:00 via INTRAVENOUS

## 2011-10-06 MED ORDER — INSULIN ASPART 100 UNIT/ML ~~LOC~~ SOLN
20.0000 [IU] | Freq: Once | SUBCUTANEOUS | Status: AC
  Administered 2011-10-06: 20 [IU] via SUBCUTANEOUS

## 2011-10-06 MED ORDER — INSULIN DETEMIR 100 UNIT/ML ~~LOC~~ SOLN
10.0000 [IU] | Freq: Every day | SUBCUTANEOUS | Status: DC
Start: 2011-10-06 — End: 2011-10-06
  Administered 2011-10-06: 10 [IU] via SUBCUTANEOUS
  Filled 2011-10-06: qty 10

## 2011-10-06 MED ORDER — POTASSIUM CHLORIDE CRYS ER 20 MEQ PO TBCR
40.0000 meq | EXTENDED_RELEASE_TABLET | Freq: Two times a day (BID) | ORAL | Status: DC
  Administered 2011-10-06: 40 meq via ORAL
  Filled 2011-10-06 (×2): qty 2

## 2011-10-06 MED ORDER — POTASSIUM CHLORIDE 10 MEQ/100ML IV SOLN
10.0000 meq | INTRAVENOUS | Status: DC
  Administered 2011-10-06 (×2): 10 meq via INTRAVENOUS
  Filled 2011-10-06 (×4): qty 100

## 2011-10-06 MED ORDER — INSULIN ASPART 100 UNIT/ML ~~LOC~~ SOLN
0.0000 [IU] | Freq: Three times a day (TID) | SUBCUTANEOUS | Status: DC
  Administered 2011-10-07 (×2): 2 [IU] via SUBCUTANEOUS
  Administered 2011-10-07: 3 [IU] via SUBCUTANEOUS
  Administered 2011-10-08: 8 [IU] via SUBCUTANEOUS

## 2011-10-06 MED ORDER — DEXTROSE 50 % IV SOLN
25.0000 mL | INTRAVENOUS | Status: DC | PRN
  Filled 2011-10-06: qty 50

## 2011-10-06 MED ORDER — INSULIN DETEMIR 100 UNIT/ML ~~LOC~~ SOLN
10.0000 [IU] | Freq: Every day | SUBCUTANEOUS | Status: DC
  Filled 2011-10-06: qty 10

## 2011-10-06 MED ORDER — INSULIN DETEMIR 100 UNIT/ML ~~LOC~~ SOLN
10.0000 [IU] | Freq: Every day | SUBCUTANEOUS | Status: DC
  Administered 2011-10-07 – 2011-10-08 (×2): 10 [IU] via SUBCUTANEOUS

## 2011-10-06 MED ORDER — SODIUM CHLORIDE 0.9 % IV SOLN
INTRAVENOUS | Status: DC
  Administered 2011-10-06: 1.7 [IU]/h via INTRAVENOUS
  Filled 2011-10-06: qty 1

## 2011-10-06 MED ORDER — DEXTROSE-NACL 5-0.45 % IV SOLN: INTRAVENOUS | Status: DC | PRN

## 2011-10-06 NOTE — Progress Notes (Signed)
Dr. Windell Hummingbird aware via phone by charge RN, Ocie Cornfield recent CBG [phone]. MD to come see pt. No new order at this time.

## 2011-10-06 NOTE — Progress Notes (Signed)
  Echocardiogram 2D Echocardiogram has been performed.  Marisa [name] 10/06/2011, 9:09 AM

## 2011-10-06 NOTE — Progress Notes (Signed)
K+ runs and insulin drip recently discontinued per Dr. Valora Piccolo order. Glucose stabilizer dc'd as well. Pt's CBG's trending down. Pt feeling well with no voiced complaints.

## 2011-10-06 NOTE — Progress Notes (Signed)
General Surgery Note  LOS: 7 days  POD# 5 Room - 1442  Assessment/Plan:  1.  INCISION AND DRAINAGE right perirectal abscess - T. Rosenbower - 09/30/2011  MRSA  WBC - 12,300 - 10/06/2011  Doing okay.  Tolerated pulse lavage yesterday.  Cont BID dressing changes with pulse lavage.   2.  Rheumatoid arthritis 3.  COPD 4.  Diabetes mellitus type 2, insulin dependent   Glucose - 574 - 10/06/2011  Continued poor control - probably affecting the wound healing.  This has been difficult to manage.   5.  Chronic anemia  Hgb - 9.7 - 10/06/2011 6.  OSA - on home O2 7.  DVT prophylaxis - Lovenox 8.  On chronic steroids - Prednisone 10 mg QD  Subjective:  Doing better today.  Husband in room.  Discussed plan. Objective:   Filed Vitals:   10/06/11 0938  BP: 123/76  Pulse: 104  Temp: 97.6 F (36.4 C)  Resp: 22     Intake/Output from previous day:  06/22 0701 - 06/23 0700 In: 800 [P.O.:800] Out: 1475 [Urine:1475]  Intake/Output this shift:     Physical Exam:   General: WN older WF who is alert and oriented.    HEENT: Normal. Pupils equal.   Abdomen:  Soft.  BS present.   Wound: Just changed dressing.  Doing well according to nursing.   Lab Results:     Basename 10/06/11 0457 10/05/11 0940  WBC 12.3* 17.1*  HGB 9.7* 9.5*  HCT 30.7* 29.4*  PLT 469* 397    BMET    Basename 10/06/11 0457 10/05/11 0940  NA 130* 130*  K 4.9 4.7  CL 94* 98  CO2 17* 18*  GLUCOSE 574* 385*  BUN 20 15  CREATININE 1.05 0.97  CALCIUM 8.6 8.6    PT/INR  No results found for this basename: LABPROT:2,INR:2 in the last 72 hours  ABG  No results found for this basename: PHART:2,PCO2:2,PO2:2,HCO3:2 in the last 72 hours   Studies/Results:  No results found.   Anti-infectives:   Anti-infectives     Start     Dose/Rate Route Frequency Ordered Stop   10/02/11 1830   vancomycin (VANCOCIN) 500 mg in sodium chloride 0.9 % 100 mL IVPB        500 mg 100 mL/hr over 60 Minutes Intravenous Every 12  hours 10/02/11 1808     09/30/11 1800   vancomycin (VANCOCIN) 500 mg in sodium chloride 0.9 % 100 mL IVPB  Status:  Discontinued        500 mg 100 mL/hr over 60 Minutes Intravenous Every 24 hours 09/29/11 2343 10/02/11 1808   09/30/11 1200   metroNIDAZOLE (FLAGYL) IVPB 500 mg  Status:  Discontinued        500 mg 100 mL/hr over 60 Minutes Intravenous Every 8 hours 09/30/11 1026 10/03/11 1006   09/30/11 1100   aztreonam (AZACTAM) 1 g in dextrose 5 % 50 mL IVPB  Status:  Discontinued        1 g 100 mL/hr over 30 Minutes Intravenous Every 8 hours 09/30/11 1032 10/03/11 1006   09/29/11 1900   vancomycin (VANCOCIN) IVPB 1000 mg/200 mL premix        1,000 mg 200 mL/hr over 60 Minutes Intravenous  Once 09/29/11 1822 09/29/11 1935          Ovidio Kin, MD, FACS Pager: [phone],   [facility] Office: [phone] 10/06/2011

## 2011-10-06 NOTE — Progress Notes (Signed)
Subjective: Patient noted to have elevated glucose to 500s. Given 20 units novolog and fsg still in 400s, Still tachycardic off and on. informs feeling better. Note for anion gap of 20 on labs with positive urine and serum ketones suggestive of  mild DKA  Objective:  Vital signs in last 24 hours:  Filed Vitals:   10/05/11 2210 10/06/11 0528 10/06/11 0938 10/06/11 0951  BP: 148/77 153/78 123/76   Pulse: 110 119 104   Temp: 98.2 F (36.8 C) 97.9 F (36.6 C) 97.6 F (36.4 C)   TempSrc: Oral Oral Oral   Resp: 18 22 22    Height:      Weight:      SpO2: 100% 97% 100% 98%    Intake/Output from previous day:   Intake/Output Summary (Last 24 hours) at 10/06/11 1132 Last data filed at 10/06/11 0900  Gross per 24 hour  Intake    560 ml  Output   1475 ml  Net   -915 ml    Physical Exam:  General: elderly female in no acute distress. Appears anxious  HEENT: no pallor, no icterus, moist oral mucosa, no JVD, no lymphadenopathy  Heart: s1 &s2 normal, HR increases on minimal exertion. Regular rhythm, without murmurs, rubs, gallops.  Lungs: Clear to auscultation bilaterally.  Abdomen: Soft, nontender, nondistended, positive bowel sounds.  Extremities: dressing over right gluteal wound which appears clean. No clubbing cyanosis, no edema with positive pedal pulses. Foley in place  Neuro: Alert, awake, oriented x3, nonfocal.    Lab Results:  Basic Metabolic Panel:    Component Value Date/Time   NA 131* 10/06/2011 0930   K 3.3* 10/06/2011 0930   CL 94* 10/06/2011 0930   CO2 17* 10/06/2011 0930   BUN 21 10/06/2011 0930   CREATININE 1.15* 10/06/2011 0930   GLUCOSE 401* 10/06/2011 0930   CALCIUM 8.6 10/06/2011 0930   CBC:    Component Value Date/Time   WBC 12.3* 10/06/2011 0457   HGB 9.7* 10/06/2011 0457   HCT 30.7* 10/06/2011 0457   PLT 469* 10/06/2011 0457   MCV 86.5 10/06/2011 0457   NEUTROABS 14.2* 10/04/2011 1135   LYMPHSABS 1.2 10/04/2011 1135   MONOABS 1.2* 10/04/2011 1135   EOSABS 0.2 10/04/2011 1135   BASOSABS 0.0 10/04/2011 1135    Recent Results (from the past 240 hour(s))  CULTURE, BLOOD (ROUTINE X 2)     Status: Normal (Preliminary result)   Collection Time   09/29/11  6:28 PM      Component Value Range Status Comment   Specimen Description BLOOD LEFT ARM  5 ML IN [facility] BOTTLE   Final    Special Requests Immunocompromised   Final    Culture  Setup Time [id number]   Final    Culture     Final    Value: GRAM POSITIVE RODS     Note: Gram Stain Report Called to,Read Back By and Verified With: CHERYL DENNY @0309  ON 10/01/2011 BY MCLET   Report Status PENDING   Incomplete   CULTURE, BLOOD (ROUTINE X 2)     Status: Normal   Collection Time   09/29/11  6:32 PM      Component Value Range Status Comment   Specimen Description BLOOD RIGHT ARM  4 ML IN [facility] BOTTLE   Final    Special Requests Immunocompromised   Final    Culture  Setup Time [id number]   Final    Culture NO GROWTH 5 DAYS   Final    Report  Status 10/05/2011 FINAL   Final   MRSA PCR SCREENING     Status: Abnormal   Collection Time   09/30/11  8:12 AM      Component Value Range Status Comment   MRSA by PCR POSITIVE (*) NEGATIVE Final   ANAEROBIC CULTURE     Status: Normal   Collection Time   09/30/11  8:35 PM      Component Value Range Status Comment   Specimen Description ABSCESS   Final    Special Requests NONE   Final    Gram Stain     Final    Value: RARE WBC PRESENT,BOTH PMN AND MONONUCLEAR     NO SQUAMOUS EPITHELIAL CELLS SEEN     FEW GRAM POSITIVE COCCI IN PAIRS   Culture NO ANAEROBES ISOLATED   Final    Report Status 10/05/2011 FINAL   Final   CULTURE, ROUTINE-ABSCESS     Status: Normal   Collection Time   09/30/11  8:35 PM      Component Value Range Status Comment   Specimen Description ABSCESS   Final    Special Requests NONE   Final    Gram Stain     Final    Value: RARE WBC PRESENT,BOTH PMN AND MONONUCLEAR     NO SQUAMOUS EPITHELIAL CELLS SEEN     FEW GRAM POSITIVE COCCI  IN PAIRS   Culture     Final    Value: MODERATE METHICILLIN RESISTANT STAPHYLOCOCCUS AUREUS     Note: RIFAMPIN AND GENTAMICIN SHOULD NOT BE USED AS SINGLE DRUGS FOR TREATMENT OF STAPH INFECTIONS. This organism DOES NOT demonstrate inducible Clindamycin resistance in vitro.     Note: CRITICAL RESULT CALLED TO, READ BACK BY AND VERIFIED WITH: TAMMY BARFIELD @ 9:05AM  10/03/11 BY DWEEKS     Gram Stain Report Called to,Read Back By and Verified With: S.SEEL RN AT [phone] ON 10/03/11 BY C.BONGEL   Report Status 10/03/2011 FINAL   Final    Organism ID, Bacteria METHICILLIN RESISTANT STAPHYLOCOCCUS AUREUS   Final   ANAEROBIC CULTURE     Status: Normal   Collection Time   09/30/11  8:41 PM      Component Value Range Status Comment   Specimen Description ABSCESS   Final    Special Requests NONE   Final    Gram Stain     Final    Value: ABUNDANT WBC PRESENT,BOTH PMN AND MONONUCLEAR     NO SQUAMOUS EPITHELIAL CELLS SEEN     MODERATE GRAM POSITIVE COCCI IN PAIRS     IN CLUSTERS   Culture NO ANAEROBES ISOLATED   Final    Report Status 10/05/2011 FINAL   Final   CULTURE, ROUTINE-ABSCESS     Status: Normal   Collection Time   09/30/11  8:41 PM      Component Value Range Status Comment   Specimen Description ABSCESS   Final    Special Requests NONE   Final    Gram Stain     Final    Value: ABUNDANT WBC PRESENT,BOTH PMN AND MONONUCLEAR     NO SQUAMOUS EPITHELIAL CELLS SEEN     MODERATE GRAM POSITIVE COCCI IN PAIRS     IN CLUSTERS   Culture     Final    Value: ABUNDANT METHICILLIN RESISTANT STAPHYLOCOCCUS AUREUS     Note: RIFAMPIN AND GENTAMICIN SHOULD NOT BE USED AS SINGLE DRUGS FOR TREATMENT OF STAPH INFECTIONS. This organism DOES NOT demonstrate inducible Clindamycin resistance  in vitro.     Note: CRITICAL RESULT CALLED TO, READ BACK BY AND VERIFIED WITH: TAMMY BARFIELD @ 9:05AM 10/03/11 BY DWEEKS     Gram Stain Report Called to,Read Back By and Verified With: S.SEEL RN AT [phone] ON 10/03/11 BY C.BONGEL     Report Status 10/03/2011 FINAL   Final    Organism ID, Bacteria METHICILLIN RESISTANT STAPHYLOCOCCUS AUREUS   Final   CLOSTRIDIUM DIFFICILE BY PCR     Status: Normal   Collection Time   10/05/11 11:24 PM      Component Value Range Status Comment   C difficile by pcr NEGATIVE  NEGATIVE Final     Studies/Results: No results found.  Medications: Scheduled Meds:   . diltiazem  30 mg Oral Q8H  . docusate sodium  100 mg Oral BID  . enoxaparin  40 mg Subcutaneous QHS  . feeding supplement  30 mL Oral BID PC  . feeding supplement  1 Container Oral QPC supper  . fluticasone  1 puff Inhalation BID  . furosemide  40 mg Intravenous Once  . insulin aspart  20 Units Subcutaneous Once  . insulin detemir  10 Units Subcutaneous QHS  . leflunomide  20 mg Oral Daily  . LORazepam  1 mg Oral Q8H  . metoprolol succinate  100 mg Oral Daily  . nystatin   Topical BID  . potassium chloride  10 mEq Intravenous Q1H  . potassium chloride  40 mEq Oral BID  . predniSONE  10 mg Oral Daily  . senna  1 tablet Oral BID  . vancomycin  500 mg Intravenous Q12H  . DISCONTD: diltiazem  30 mg Oral Q6H  . DISCONTD: insulin aspart  0-15 Units Subcutaneous TID WC  . DISCONTD: insulin detemir  10 Units Subcutaneous Daily   Continuous Infusions:   . sodium chloride 100 mL/hr at 10/06/11 0945  . insulin (NOVOLIN-R) infusion    . DISCONTD: sodium chloride    . DISCONTD: sodium chloride     PRN Meds:.acetaminophen, acetaminophen, albuterol, dextrose 5 % and 0.45% NaCl, dextrose, HYDROcodone-acetaminophen, morphine injection, ondansetron (ZOFRAN) IV, promethazine, traMADol, DISCONTD: diltiazem, DISCONTD: loperamide  Assessment/Plan:    cellulitis of buttocks  Patient presented with sepsis now resolved  s/p Complex drainage on 6/17 by surgery -.  Final culture growing MRSA which is sensitive to clinda, bactrim and tetracyclines including vanco. Will narrow coverage with vanco only ( day 6) and can be transitioned  to 10-14 day course of po abx on discharge.  Continue pain control with tramadol 50 mg every 6 h prn, fentanyl 25-50 mcg, and morphine prn. Monitor for sedation.  Surgery consult following .  -wound appears to be clean and improving. continue foley for now give concern for soiling of the wound during bowel movements .  -PT eval recommends LTAC vs home health with PT depending upon progression    Uncontrolled DM with DKA Patient has been having low fsg overnight for past 2 -3 days for which levemir was dced this am her fag is elevated to 500 with AG of 20 on labs with positive urine and serum ketones suggesting DKA  will start on glucose stabilizer protocol for DKA, check VBG, check BMET Q 2 hr for resolution of gap.  IVNS@100  cc/hr, correct low kcl, check mg  Add D5 once fsg <250  Tachycardia  note for HR in 110-120s for past few days going upto 140s on movement.  Possibly related to infection, dehydration, On toprol xl. prn cardizem ,  switched to scheduled  No EKG changes noted  On evaluating previous EKGs patient seems to have chronic tachycardia  TSH wnl  CXR wnl. Pro BNP elevated . will get 2d echo  Patient has increasing leucocytosis, UA suggests UTI, urine cx sent. Will send for c diff   Anemia  -likely secondary to acute blood loss from surgery,given 1u PRBC on 6/18. Stable at 9.7  H/oNSTEMI in 11/2010  had a normal Myoview and positive troponins thought secondary to acute illness ( DKA) at that time. Would keep on beta blocker at present time.. Follow up with cardiology as outpt  Elevated Pro BP with persistent tachycardia  2d echo ordered.   Rheumatoid arthritis-  Cont with prednisone and leflunamide   HTN  Continue toprol. holding lisinopril     DVT prophylaxis  Sq lovenox   Monitor in tele. Will get cardiology eval if continues to be tachycardic despite further workup and if echo abnormal  Full code        LOS: 7 days   Xana Bradt 10/06/2011, 11:32  AM

## 2011-10-06 NOTE — Progress Notes (Signed)
CRITICAL VALUE ALERT  Critical value received:  cbg [id number]  Date of notification:  10/06/11  Time of notification:  0636  Critical value read back:yes  Nurse who received alert:  de  MD notified (1st page):  tcallahan  Time of first page:  0637  MD notified (2nd page):  Time of second page:  Responding MD:  tcallahan  Time MD responded:  [phone]

## 2011-10-07 DIAGNOSIS — E1165 Type 2 diabetes mellitus with hyperglycemia: Secondary | ICD-10-CM

## 2011-10-07 DIAGNOSIS — L0231 Cutaneous abscess of buttock: Secondary | ICD-10-CM

## 2011-10-07 DIAGNOSIS — L03317 Cellulitis of buttock: Secondary | ICD-10-CM

## 2011-10-07 DIAGNOSIS — R000 Tachycardia, unspecified: Secondary | ICD-10-CM

## 2011-10-07 LAB — CBC
HCT: 27.8 % — ABNORMAL LOW (ref 36.0–46.0)
Hemoglobin: 8.9 g/dL — ABNORMAL LOW (ref 12.0–15.0)
MCHC: 32 g/dL (ref 30.0–36.0)
MCV: 84.8 fL (ref 78.0–100.0)
RDW: 14.7 % (ref 11.5–15.5)

## 2011-10-07 LAB — BASIC METABOLIC PANEL
BUN: 12 mg/dL (ref 6–23)
Creatinine, Ser: 0.81 mg/dL (ref 0.50–1.10)
GFR calc non Af Amer: 72 mL/min — ABNORMAL LOW (ref >90)
Glucose, Bld: 162 mg/dL — ABNORMAL HIGH (ref 70–99)
Potassium: 4.2 mEq/L (ref 3.5–5.1)

## 2011-10-07 LAB — GLUCOSE, CAPILLARY
Glucose-Capillary: 141 mg/dL — ABNORMAL HIGH (ref 70–99)
Glucose-Capillary: 195 mg/dL — ABNORMAL HIGH (ref 70–99)
Glucose-Capillary: 124 mg/dL — ABNORMAL HIGH (ref 70–99)
Glucose-Capillary: 164 mg/dL — ABNORMAL HIGH (ref 70–99)

## 2011-10-07 NOTE — Progress Notes (Signed)
HYDROTHERAPY PROGRESS NOTE 10/07/11 1000  Subjective Assessment  Subjective pt feels better with morphine premed  Patient and Family Stated Goals to have wound heal up  Date of Onset 09/22/11  Prior Treatments I and D 09/30/11  Evaluation and Treatment  Evaluation and Treatment Procedures Explained to Patient/Family Yes  Evaluation and Treatment Procedures agreed to  Wound 09/29/11 Other (Comment) Buttocks Right;Mid reddened and tender  Date First Assessed/Time First Assessed: 09/29/11 1443   Wound Type: (c) Other (Comment)  Location: Buttocks  Location Orientation: Right;Mid  Wound Description (Comments): reddened and tender  Present on Admission: Yes  Site / Wound Assessment Pink;Yellow  % Wound base Red or Granulating 85%  % Wound base Yellow 15%  % Wound base Other (Comment) (area now appears to be granulating)  Peri-wound Assessment Pink;Excoriated  Margins Unattacted edges (unapproximated)  Closure None  Drainage Amount Minimal  Drainage Description Serous  Non-staged Wound Description Full thickness  Dressing Type Moist to dry;Silicone dressing (applied allevyn to avoid irritation from tape dressings)  Dressing Changed Changed  Dressing Status Clean;Intact;Dry  Hydrotherapy  Pulsed Lavage with Suction (psi) 4 psi  Pulsed Lavage with Suction - Normal Saline Used 1000 mL  Pulsed Lavage Tip Tip with splash shield  Pulsed lavage therapy - wound location PLS to right buttock  Wound Therapy - Assess/Plan/Recommendations  Wound Therapy - Clinical Statement appears improving since last treatment  Wound Therapy - Functional Problem List anxiety with dressing change  Factors Delaying/Impairing Wound Healing Infection - systemic/local;Immobility;Multiple medical problems;Other (comment);Diabetes Mellitus  Hydrotherapy Plan Dressing change;Patient/family education;Pulsatile lavage with suction  Wound Therapy - Frequency 6X / week  Wound Therapy - Follow Up Recommendations Other  (comment) (LTACH)  Wound Plan contiunue PLS for wound cleansing  Wound Therapy Goals - Improve the function of patient's integumentary system by progressing the wound(s) through the phases of wound healing by:  Decrease Necrotic Tissue - Progress Progressing toward goal  Increase Granulation Tissue - Progress Met  Improve Drainage Characteristics Min  Improve Drainage Characteristics - Progress Goal set today  Additional Wound Therapy Goal increase granulation to 95%  Additional Wound Therapy Goal - Progress Goal set today  Goals/treatment plan/discharge plan were made with and agreed upon by patient/family Yes  Time For Goal Achievement 7 days  Wound Therapy - Potential for Goals Good

## 2011-10-07 NOTE — Progress Notes (Signed)
Referral made to [facility] (in network with insurance); received telephone call from Driscilla Grammes, they can accept the patient and will have a bed available tomorrow; B Ave Filter RN, BSN, MHA;

## 2011-10-07 NOTE — Progress Notes (Signed)
Subjective: patient feels mch better today. Was note to have mild DKA on 6/23 for which she received insulin drip for a short duration. fsg much stable now. tachycardia improved  Objective:  Vital signs in last 24 hours:  Filed Vitals:   10/06/11 2100 10/07/11 0541 10/07/11 0906 10/07/11 1030  BP: 184/98 155/77  196/78  Pulse: 102 90    Temp: 98 F (36.7 C) 97.8 F (36.6 C)    TempSrc: Oral Oral    Resp: 20 18    Height:      Weight:      SpO2: 100% 98% 96%     Intake/Output from previous day:   Intake/Output Summary (Last 24 hours) at 10/07/11 1254 Last data filed at 10/07/11 0543  Gross per 24 hour  Intake   1625 ml  Output   1625 ml  Net      0 ml    Physical Exam:  General: elderly female in no acute distress. Appears anxious  HEENT: no pallor, no icterus, moist oral mucosa, no JVD, no lymphadenopathy  Heart: s1 &s2 normal,occasionally tachy to low 100s. Regular rhythm, without murmurs, rubs, gallops.  Lungs: Clear to auscultation bilaterally.  Abdomen: Soft, nontender, nondistended, positive bowel sounds.  Extremities: dressing over right gluteal wound which appears clean. No clubbing cyanosis, no edema with positive pedal pulses. Foley in place  Neuro: Alert, awake, oriented x3, nonfocal.    Lab Results:  Basic Metabolic Panel:    Component Value Date/Time   NA 135 10/07/2011 0405   K 4.2 10/07/2011 0405   CL 105 10/07/2011 0405   CO2 24 10/07/2011 0405   BUN 12 10/07/2011 0405   CREATININE 0.81 10/07/2011 0405   GLUCOSE 162* 10/07/2011 0405   CALCIUM 8.3* 10/07/2011 0405   CBC:    Component Value Date/Time   WBC 9.6 10/07/2011 0405   HGB 8.9* 10/07/2011 0405   HCT 27.8* 10/07/2011 0405   PLT 439* 10/07/2011 0405   MCV 84.8 10/07/2011 0405   NEUTROABS 14.2* 10/04/2011 1135   LYMPHSABS 1.2 10/04/2011 1135   MONOABS 1.2* 10/04/2011 1135   EOSABS 0.2 10/04/2011 1135   BASOSABS 0.0 10/04/2011 1135    Recent Results (from the past 240 hour(s))  CULTURE, BLOOD  (ROUTINE X 2)     Status: Normal (Preliminary result)   Collection Time   09/29/11  6:28 PM      Component Value Range Status Comment   Specimen Description BLOOD LEFT ARM  5 ML IN [facility] BOTTLE   Final    Special Requests Immunocompromised   Final    Culture  Setup Time [id number]   Final    Culture     Final    Value: GRAM POSITIVE RODS     Note: Gram Stain Report Called to,Read Back By and Verified With: CHERYL DENNY @0309  ON 10/01/2011 BY MCLET     BACTEROIDES UNIFORMIS     Note: BETA LACTAMASE POSITIVE Gram Stain Report Called to,Read Back By and Verified With: PAULA ARMSTRONG 10/06/11 1515 BY SMITHERSJ   Report Status PENDING   Incomplete   CULTURE, BLOOD (ROUTINE X 2)     Status: Normal   Collection Time   09/29/11  6:32 PM      Component Value Range Status Comment   Specimen Description BLOOD RIGHT ARM  4 ML IN [facility] BOTTLE   Final    Special Requests Immunocompromised   Final    Culture  Setup Time [id number]   Final  Culture NO GROWTH 5 DAYS   Final    Report Status 10/05/2011 FINAL   Final   MRSA PCR SCREENING     Status: Abnormal   Collection Time   09/30/11  8:12 AM      Component Value Range Status Comment   MRSA by PCR POSITIVE (*) NEGATIVE Final   ANAEROBIC CULTURE     Status: Normal   Collection Time   09/30/11  8:35 PM      Component Value Range Status Comment   Specimen Description ABSCESS   Final    Special Requests NONE   Final    Gram Stain     Final    Value: RARE WBC PRESENT,BOTH PMN AND MONONUCLEAR     NO SQUAMOUS EPITHELIAL CELLS SEEN     FEW GRAM POSITIVE COCCI IN PAIRS   Culture NO ANAEROBES ISOLATED   Final    Report Status 10/05/2011 FINAL   Final   CULTURE, ROUTINE-ABSCESS     Status: Normal   Collection Time   09/30/11  8:35 PM      Component Value Range Status Comment   Specimen Description ABSCESS   Final    Special Requests NONE   Final    Gram Stain     Final    Value: RARE WBC PRESENT,BOTH PMN AND MONONUCLEAR     NO SQUAMOUS EPITHELIAL  CELLS SEEN     FEW GRAM POSITIVE COCCI IN PAIRS   Culture     Final    Value: MODERATE METHICILLIN RESISTANT STAPHYLOCOCCUS AUREUS     Note: RIFAMPIN AND GENTAMICIN SHOULD NOT BE USED AS SINGLE DRUGS FOR TREATMENT OF STAPH INFECTIONS. This organism DOES NOT demonstrate inducible Clindamycin resistance in vitro.     Note: CRITICAL RESULT CALLED TO, READ BACK BY AND VERIFIED WITH: TAMMY BARFIELD @ 9:05AM  10/03/11 BY DWEEKS     Gram Stain Report Called to,Read Back By and Verified With: S.SEEL RN AT [phone] ON 10/03/11 BY C.BONGEL   Report Status 10/03/2011 FINAL   Final    Organism ID, Bacteria METHICILLIN RESISTANT STAPHYLOCOCCUS AUREUS   Final   ANAEROBIC CULTURE     Status: Normal   Collection Time   09/30/11  8:41 PM      Component Value Range Status Comment   Specimen Description ABSCESS   Final    Special Requests NONE   Final    Gram Stain     Final    Value: ABUNDANT WBC PRESENT,BOTH PMN AND MONONUCLEAR     NO SQUAMOUS EPITHELIAL CELLS SEEN     MODERATE GRAM POSITIVE COCCI IN PAIRS     IN CLUSTERS   Culture NO ANAEROBES ISOLATED   Final    Report Status 10/05/2011 FINAL   Final   CULTURE, ROUTINE-ABSCESS     Status: Normal   Collection Time   09/30/11  8:41 PM      Component Value Range Status Comment   Specimen Description ABSCESS   Final    Special Requests NONE   Final    Gram Stain     Final    Value: ABUNDANT WBC PRESENT,BOTH PMN AND MONONUCLEAR     NO SQUAMOUS EPITHELIAL CELLS SEEN     MODERATE GRAM POSITIVE COCCI IN PAIRS     IN CLUSTERS   Culture     Final    Value: ABUNDANT METHICILLIN RESISTANT STAPHYLOCOCCUS AUREUS     Note: RIFAMPIN AND GENTAMICIN SHOULD NOT BE USED AS SINGLE DRUGS FOR  TREATMENT OF STAPH INFECTIONS. This organism DOES NOT demonstrate inducible Clindamycin resistance in vitro.     Note: CRITICAL RESULT CALLED TO, READ BACK BY AND VERIFIED WITH: TAMMY BARFIELD @ 9:05AM 10/03/11 BY DWEEKS     Gram Stain Report Called to,Read Back By and Verified With:  S.SEEL RN AT 0925 ON 10/03/11 BY C.BONGEL   Report Status 10/03/2011 FINAL   Final    Organism ID, Bacteria METHICILLIN RESISTANT STAPHYLOCOCCUS AUREUS   Final   URINE CULTURE     Status: Normal   Collection Time   10/05/11  4:34 PM      Component Value Range Status Comment   Specimen Description URINE, CATHETERIZED   Final    Special Requests NONE   Final    Culture  Setup Time [id number]   Final    Colony Count 95,000 COLONIES/ML   Final    Culture YEAST   Final    Report Status 10/06/2011 FINAL   Final   CLOSTRIDIUM DIFFICILE BY PCR     Status: Normal   Collection Time   10/05/11 11:24 PM      Component Value Range Status Comment   C difficile by pcr NEGATIVE  NEGATIVE Final     Studies/Results: No results found.  Medications: Scheduled Meds:   . diltiazem  30 mg Oral Q8H  . docusate sodium  100 mg Oral BID  . enoxaparin  40 mg Subcutaneous QHS  . feeding supplement  30 mL Oral BID PC  . feeding supplement  1 Container Oral QPC supper  . fluticasone  1 puff Inhalation BID  . insulin aspart  0-15 Units Subcutaneous TID WC  . insulin detemir  10 Units Subcutaneous Daily  . leflunomide  20 mg Oral Daily  . LORazepam  1 mg Oral Q8H  . metoprolol succinate  100 mg Oral Daily  . nystatin   Topical BID  . predniSONE  10 mg Oral Daily  . senna  1 tablet Oral BID  . vancomycin  500 mg Intravenous Q12H  . DISCONTD: insulin detemir  10 Units Subcutaneous QHS  . DISCONTD: potassium chloride  10 mEq Intravenous Q1H  . DISCONTD: potassium chloride  40 mEq Oral BID   Continuous Infusions:   . sodium chloride 100 mL/hr at 10/06/11 2300  . DISCONTD: insulin (NOVOLIN-R) infusion 1.7 Units/hr (10/06/11 1141)   PRN Meds:.acetaminophen, acetaminophen, albuterol, dextrose, HYDROcodone-acetaminophen, morphine injection, ondansetron (ZOFRAN) IV, promethazine, traMADol, DISCONTD: dextrose 5 % and 0.45% NaCl    Assessment/Plan:  cellulitis of right buttocks  Patient presented with  sepsis,  now resolved  s/p Complex drainage on 6/17 by surgery -.  - culture growing MRSA, on vanco ( day 7), will be transitioned to 14 day course of po abx on discharge.  Continue pain control with tramadol 50 mg every 6 h prn, fentanyl 25-50 mcg, and morphine prn. Monitor for sedation.  Surgery consult following .  -wound appears to be clean and improving. D/c foley -PT eval recommends LTAC for better wound care  Uncontrolled DM with DKA  Patient was  having low fsg overnight for past 2 -3 days for which levemir was dced On 6/23 she had elevated fsg to 500 with mild DKA and placed on insulin drip for few hrs. Subsequent fsg have been stable with normal electrolytes  Tachycardia  note for HR in 110-120s for several days, possibly due to infection and dehydration. Now improving On toprol xl. And cardizem No EKG changes noted  TSH wnl  CXR wnl. Pro BNP elevated . 2D echo with normal EF  Anemia  -likely secondary to acute blood loss from surgery,given 1u PRBC on 6/18. Stable at 8.7 today   H/oNSTEMI in 11/2010  had a normal Myoview and positive troponins thought secondary to acute illness ( DKA) at that time. Would keep on beta blocker at present time.. Follow up with cardiology as outpt  Elevated Pro BP with persistent tachycardia  2d echo with normal EF and grade 1 diastolic dysfn  Rheumatoid arthritis-  Cont with prednisone and leflunamide   HTN  Continue toprol. holding lisinopril   DVT prophylaxis  Sq lovenox   D/c to LTAC if continues to improve overnight  Full code     LOS: 8 days   Marisa [name] 10/07/2011, 12:54 PM

## 2011-10-07 NOTE — Progress Notes (Signed)
7 Days Post-Op  Subjective: She's in bed in good spirits, plan HydroRX latter this AM and will look at the wound then.  Objective: Vital signs in last 24 hours: Temp:  [97.6 F (36.4 C)-98.3 F (36.8 C)] 97.8 F (36.6 C) (06/24 0541) Pulse Rate:  [90-105] 90  (06/24 0541) Resp:  [18-22] 18  (06/24 0541) BP: (123-184)/(76-98) 155/77 mmHg (06/24 0541) SpO2:  [98 %-100 %] 98 % (06/24 0541) Last BM Date: 10/06/11  Afebrile, Vss, BP is up some. Labs OK,  Intake/Output from previous day: 06/23 0701 - 06/24 0700 In: 1865 [P.O.:240; I.V.:1325; IV Piggyback:300] Out: 1625 [Urine:1625] Intake/Output this shift:    General appearance: alert, cooperative and no distress Resp: few rales each base, some wheezing Incision/Wound: Wound looks very good.  I debrided sharply some tissue at he base. But for the most part it looks clean and pink.  Lab Results:   Basename 10/07/11 0405 10/06/11 0457  WBC 9.6 12.3*  HGB 8.9* 9.7*  HCT 27.8* 30.7*  PLT 439* 469*    BMET  Basename 10/07/11 0405 10/06/11 1425  NA 135 130*  K 4.2 4.4  CL 105 100  CO2 24 21  GLUCOSE 162* 176*  BUN 12 18  CREATININE 0.81 0.95  CALCIUM 8.3* 8.5   PT/INR No results found for this basename: LABPROT:2,INR:2 in the last 72 hours   Lab 10/01/11 0335  AST 20  ALT 11  ALKPHOS 113  BILITOT 0.2*  PROT 5.1*  ALBUMIN 1.9*     Lipase     Component Value Date/Time   LIPASE 22 12/07/2008 0505     Studies/Results: No results found.  Medications:    . diltiazem  30 mg Oral Q8H  . docusate sodium  100 mg Oral BID  . enoxaparin  40 mg Subcutaneous QHS  . feeding supplement  30 mL Oral BID PC  . feeding supplement  1 Container Oral QPC supper  . fluticasone  1 puff Inhalation BID  . insulin aspart  0-15 Units Subcutaneous TID WC  . insulin detemir  10 Units Subcutaneous Daily  . leflunomide  20 mg Oral Daily  . LORazepam  1 mg Oral Q8H  . metoprolol succinate  100 mg Oral Daily  . nystatin    Topical BID  . predniSONE  10 mg Oral Daily  . senna  1 tablet Oral BID  . vancomycin  500 mg Intravenous Q12H  . DISCONTD: insulin aspart  0-15 Units Subcutaneous TID WC  . DISCONTD: insulin detemir  10 Units Subcutaneous Daily  . DISCONTD: insulin detemir  10 Units Subcutaneous QHS  . DISCONTD: potassium chloride  10 mEq Intravenous Q1H  . DISCONTD: potassium chloride  40 mEq Oral BID    Assessment/Plan INCISION AND DRAINAGE right perirectal abscess -  T. MRSA - [facility] - 09/30/2011 Rheumatoid arthritis,On chronic steroids - Prednisone 10 mg QD  COPD Diabetes mellitus type 2, insulin dependent,  Poor control Chronic anemia OSA - on home O2 DVT prophylaxis - Lovenox   Plan:  Dressing changes/hydroRx.  They are considering LTAC, and we agree  That would be a good option, I think it would be to much for SNF to care for at this point.  It is not something we can do a wound vac on.  She also has issues with her skin away from the incision.         LOS: 8 days    Marisa [name] 10/07/2011

## 2011-10-07 NOTE — Progress Notes (Signed)
Physical Therapy Treatment Patient Details Name: Marisa [name] MRN: [id number] DOB: May 01, 1940 Today's Date: 10/07/2011 Time: 4098-1191 PT Time Calculation (min): 28 min  PT Assessment / Plan / Recommendation Comments on Treatment Session  pt improved in moblity stability and tolerance, but continues to need more PT prior to d/c to home.  She self limits her activity due to fatigue and dyspnea    Follow Up Recommendations  LTACH    Barriers to Discharge        Equipment Recommendations  Defer to next venue    Recommendations for Other Services OT consult  Frequency Min 3X/week   Plan Discharge plan needs to be updated    Precautions / Restrictions Restrictions Weight Bearing Restrictions: No   Pertinent Vitals/Pain Pt bothered by foley catheter    Mobility  Bed Mobility Bed Mobility: Rolling Right;Rolling Left;Supine to Sit;Sit to Supine Rolling Right: 4: Min assist Rolling Left: 4: Min assist Supine to Sit: 4: Min assist Sit to Supine: 4: Min assist Details for Bed Mobility Assistance: pt needs some assist to move legs to edge of bed Transfers Transfers: Sit to Stand;Stand to Sit Sit to Stand: 5: Supervision Stand to Sit: 5: Supervision Details for Transfer Assistance: safety Ambulation/Gait Ambulation/Gait Assistance: 4: Min assist Ambulation Distance (Feet): 75 Feet Assistive device: Rolling walker Ambulation/Gait Assistance Details: nasal O2 maintained for ambulation. Pt needs some cues to extend trunk and control O2 and IV for her to walk with RW Gait Pattern: Step-through pattern Gait velocity: slow General Gait Details: improved with RW, though impulsive at times and needs min assist for safety.  Dyspnea apparent even with nasal O2 Stairs: No Wheelchair Mobility Wheelchair Mobility: No    Exercises General Exercises - Lower Extremity Ankle Circles/Pumps: AROM;Both;5 reps Quad Sets: AROM;Both;Standing;10 reps Gluteal Sets: AROM;Both;5  reps;Standing Straight Leg Raises: AROM;10 reps;Both;Standing Hip Flexion/Marching: AROM;Both;5 reps;Standing   PT Diagnosis:    PT Problem List:   PT Treatment Interventions:     PT Goals Acute Rehab PT Goals PT Goal Formulation: With patient/family Time For Goal Achievement: 10/18/11 Potential to Achieve Goals: Good Pt will go Supine/Side to Sit: Independently PT Goal: Supine/Side to Sit - Progress: Progressing toward goal Pt will Ambulate: >150 feet;with modified independence;with least restrictive assistive device PT Goal: Ambulate - Progress: Progressing toward goal Pt will Go Up / Down Stairs: 1-2 stairs;with min assist;with rail(s)  Visit Information  Last PT Received On: 10/07/11 Assistance Needed: +1    Subjective Data  Subjective: sometimes it feels good just to stand Patient Stated Goal: to go to [facility] , then go home   Cognition  Overall Cognitive Status: Appears within functional limits for tasks assessed/performed Arousal/Alertness: Awake/alert Orientation Level: Appears intact for tasks assessed Behavior During Session: Anxious    Balance  Balance Balance Assessed: Yes Static Standing Balance Static Standing - Balance Support: Bilateral upper extremity supported Static Standing - Level of Assistance: 6: Modified independent (Device/Increase time) Static Standing - Comment/# of Minutes: > 2 min, > 5 min, pt did leg lifts, weight shifts, trunk extensions  to improve standing strength and balance Single Leg Stance - Left Leg:  (multiple right leg lifts to prepare for home wound care)  End of Session PT - End of Session Equipment Utilized During Treatment: Oxygen Activity Tolerance: Patient limited by fatigue Patient left: in bed;with call bell/phone within reach (lying on left side)    Marisa [name] 10/07/2011, 12:20 PM

## 2011-10-08 DIAGNOSIS — L0231 Cutaneous abscess of buttock: Secondary | ICD-10-CM | POA: Diagnosis not present

## 2011-10-08 DIAGNOSIS — K861 Other chronic pancreatitis: Secondary | ICD-10-CM | POA: Diagnosis not present

## 2011-10-08 DIAGNOSIS — D638 Anemia in other chronic diseases classified elsewhere: Secondary | ICD-10-CM | POA: Diagnosis not present

## 2011-10-08 DIAGNOSIS — L89309 Pressure ulcer of unspecified buttock, unspecified stage: Secondary | ICD-10-CM | POA: Diagnosis not present

## 2011-10-08 DIAGNOSIS — IMO0002 Reserved for concepts with insufficient information to code with codable children: Secondary | ICD-10-CM | POA: Diagnosis not present

## 2011-10-08 DIAGNOSIS — E1110 Type 2 diabetes mellitus with ketoacidosis without coma: Secondary | ICD-10-CM | POA: Diagnosis not present

## 2011-10-08 DIAGNOSIS — E876 Hypokalemia: Secondary | ICD-10-CM

## 2011-10-08 DIAGNOSIS — L0390 Cellulitis, unspecified: Secondary | ICD-10-CM | POA: Diagnosis not present

## 2011-10-08 DIAGNOSIS — L299 Pruritus, unspecified: Secondary | ICD-10-CM | POA: Diagnosis not present

## 2011-10-08 DIAGNOSIS — L03319 Cellulitis of trunk, unspecified: Secondary | ICD-10-CM | POA: Diagnosis not present

## 2011-10-08 DIAGNOSIS — I2510 Atherosclerotic heart disease of native coronary artery without angina pectoris: Secondary | ICD-10-CM | POA: Diagnosis not present

## 2011-10-08 DIAGNOSIS — Z794 Long term (current) use of insulin: Secondary | ICD-10-CM | POA: Diagnosis not present

## 2011-10-08 DIAGNOSIS — B370 Candidal stomatitis: Secondary | ICD-10-CM | POA: Diagnosis not present

## 2011-10-08 DIAGNOSIS — J449 Chronic obstructive pulmonary disease, unspecified: Secondary | ICD-10-CM | POA: Diagnosis not present

## 2011-10-08 DIAGNOSIS — R000 Tachycardia, unspecified: Secondary | ICD-10-CM | POA: Diagnosis not present

## 2011-10-08 DIAGNOSIS — E119 Type 2 diabetes mellitus without complications: Secondary | ICD-10-CM | POA: Diagnosis not present

## 2011-10-08 DIAGNOSIS — J9600 Acute respiratory failure, unspecified whether with hypoxia or hypercapnia: Secondary | ICD-10-CM | POA: Diagnosis not present

## 2011-10-08 DIAGNOSIS — R6510 Systemic inflammatory response syndrome (SIRS) of non-infectious origin without acute organ dysfunction: Secondary | ICD-10-CM

## 2011-10-08 DIAGNOSIS — J15212 Pneumonia due to Methicillin resistant Staphylococcus aureus: Secondary | ICD-10-CM | POA: Diagnosis not present

## 2011-10-08 DIAGNOSIS — B372 Candidiasis of skin and nail: Secondary | ICD-10-CM | POA: Diagnosis not present

## 2011-10-08 DIAGNOSIS — E46 Unspecified protein-calorie malnutrition: Secondary | ICD-10-CM | POA: Diagnosis present

## 2011-10-08 DIAGNOSIS — B958 Unspecified staphylococcus as the cause of diseases classified elsewhere: Secondary | ICD-10-CM | POA: Diagnosis not present

## 2011-10-08 DIAGNOSIS — E162 Hypoglycemia, unspecified: Secondary | ICD-10-CM | POA: Diagnosis not present

## 2011-10-08 DIAGNOSIS — M069 Rheumatoid arthritis, unspecified: Secondary | ICD-10-CM | POA: Diagnosis not present

## 2011-10-08 DIAGNOSIS — E43 Unspecified severe protein-calorie malnutrition: Secondary | ICD-10-CM | POA: Diagnosis not present

## 2011-10-08 DIAGNOSIS — B9562 Methicillin resistant Staphylococcus aureus infection as the cause of diseases classified elsewhere: Secondary | ICD-10-CM | POA: Diagnosis present

## 2011-10-08 DIAGNOSIS — E1010 Type 1 diabetes mellitus with ketoacidosis without coma: Secondary | ICD-10-CM | POA: Diagnosis not present

## 2011-10-08 DIAGNOSIS — K649 Unspecified hemorrhoids: Secondary | ICD-10-CM | POA: Diagnosis not present

## 2011-10-08 DIAGNOSIS — I252 Old myocardial infarction: Secondary | ICD-10-CM | POA: Diagnosis not present

## 2011-10-08 DIAGNOSIS — N179 Acute kidney failure, unspecified: Secondary | ICD-10-CM | POA: Diagnosis not present

## 2011-10-08 DIAGNOSIS — A4902 Methicillin resistant Staphylococcus aureus infection, unspecified site: Secondary | ICD-10-CM | POA: Diagnosis not present

## 2011-10-08 DIAGNOSIS — Z6825 Body mass index (BMI) 25.0-25.9, adult: Secondary | ICD-10-CM | POA: Diagnosis not present

## 2011-10-08 DIAGNOSIS — S31809A Unspecified open wound of unspecified buttock, initial encounter: Secondary | ICD-10-CM | POA: Diagnosis not present

## 2011-10-08 LAB — CBC
MCH: 26.7 pg (ref 26.0–34.0)
Platelets: 452 10*3/uL — ABNORMAL HIGH (ref 150–400)
RBC: 3.33 MIL/uL — ABNORMAL LOW (ref 3.87–5.11)
RDW: 14.6 % (ref 11.5–15.5)
WBC: 7.2 10*3/uL (ref 4.0–10.5)

## 2011-10-08 MED ORDER — INSULIN DETEMIR 100 UNIT/ML ~~LOC~~ SOLN: 10.0000 [IU] | Freq: Every day | SUBCUTANEOUS | Status: DC

## 2011-10-08 MED ORDER — BOOST / RESOURCE BREEZE PO LIQD: 1.0000 | Freq: Every day | ORAL | Status: DC

## 2011-10-08 MED ORDER — HYDROCODONE-ACETAMINOPHEN 5-325 MG PO TABS: 1.0000 | ORAL_TABLET | ORAL | Status: AC | PRN

## 2011-10-08 MED ORDER — DIPHENHYDRAMINE HCL 50 MG/ML IJ SOLN
12.5000 mg | Freq: Four times a day (QID) | INTRAMUSCULAR | Status: DC | PRN
  Administered 2011-10-08: 12.5 mg via INTRAVENOUS
  Filled 2011-10-08: qty 1

## 2011-10-08 MED ORDER — DOCUSATE SODIUM 100 MG PO CAPS: 100.0000 mg | ORAL_CAPSULE | Freq: Every day | ORAL | Status: DC | PRN

## 2011-10-08 MED ORDER — SULFAMETHOXAZOLE-TRIMETHOPRIM 800-160 MG PO TABS: 1.0000 | ORAL_TABLET | Freq: Two times a day (BID) | ORAL | Status: AC

## 2011-10-08 MED ORDER — ALBUTEROL SULFATE (5 MG/ML) 0.5% IN NEBU: 2.5000 mg | INHALATION_SOLUTION | Freq: Four times a day (QID) | RESPIRATORY_TRACT | Status: DC | PRN

## 2011-10-08 MED ORDER — HYDRALAZINE HCL 20 MG/ML IJ SOLN
5.0000 mg | INTRAMUSCULAR | Status: DC | PRN
  Administered 2011-10-08 (×2): 5 mg via INTRAVENOUS
  Filled 2011-10-08 (×2): qty 1

## 2011-10-08 MED ORDER — DILTIAZEM HCL 30 MG PO TABS: 30.0000 mg | ORAL_TABLET | Freq: Three times a day (TID) | ORAL | Status: DC

## 2011-10-08 MED ORDER — PRO-STAT SUGAR FREE PO LIQD: 30.0000 mL | Freq: Two times a day (BID) | ORAL | Status: DC

## 2011-10-08 MED ORDER — LISINOPRIL 10 MG PO TABS
10.0000 mg | ORAL_TABLET | Freq: Every day | ORAL | Status: DC
  Administered 2011-10-08: 10 mg via ORAL
  Filled 2011-10-08: qty 1

## 2011-10-08 NOTE — Progress Notes (Signed)
HYDROTHERAPY NOTE 10/08/11 1100  Subjective Assessment  Patient and Family Stated Goals to have wound heal up  Date of Onset 09/22/11  Prior Treatments I and D 09/30/11  Evaluation and Treatment  Evaluation and Treatment Procedures Explained to Patient/Family Yes  Evaluation and Treatment Procedures agreed to  Wound 09/29/11 Other (Comment) Buttocks Right;Mid reddened and tender  Date First Assessed/Time First Assessed: 09/29/11 1443   Wound Type: (c) Other (Comment)  Location: Buttocks  Location Orientation: Right;Mid  Wound Description (Comments): reddened and tender  Present on Admission: Yes  Site / Wound Assessment Pink;Yellow  % Wound base Red or Granulating 85%  % Wound base Yellow 15%  Peri-wound Assessment Pink;Excoriated  Margins Unattacted edges (unapproximated)  Closure None  Drainage Amount Minimal  Drainage Description Serous  Non-staged Wound Description Full thickness  Dressing Type Moist to dry;Silicone dressing (applied allevyn to avoid irritation from tape dressings)  Dressing Status Clean;Intact;Dry  Hydrotherapy  Pulsed Lavage with Suction (psi) 4 psi  Pulsed Lavage with Suction - Normal Saline Used 1000 mL  Pulsed Lavage Tip Tip with splash shield  Pulsed lavage therapy - wound location PLS to right buttock  Wound Therapy - Assess/Plan/Recommendations  Wound Therapy - Clinical Statement pt continues with significant pain with PLS.  She continues to need premed.  was able to be dressed with one 5x5 allevyn today  Wound Therapy - Functional Problem List anxiety , pain  Factors Delaying/Impairing Wound Healing Infection - systemic/local;Immobility;Multiple medical problems;Other (comment);Diabetes Mellitus  Hydrotherapy Plan Dressing change;Patient/family education;Pulsatile lavage with suction  Wound Therapy - Frequency 6X / week  Wound Therapy - Follow Up Recommendations Other (comment) (LTACH)  Wound Plan contiunue PLS for wound cleansing  Wound Therapy Goals  - Improve the function of patient's integumentary system by progressing the wound(s) through the phases of wound healing by:  Decrease Necrotic Tissue - Progress Progressing toward goal  Increase Granulation Tissue - Progress Met  Improve Drainage Characteristics Min  Improve Drainage Characteristics - Progress Progressing toward goal  Additional Wound Therapy Goal increase granulation to 95%  Additional Wound Therapy Goal - Progress Progressing toward goal  Goals/treatment plan/discharge plan were made with and agreed upon by patient/family Yes  Time For Goal Achievement 7 days  Wound Therapy - Potential for Goals Good

## 2011-10-08 NOTE — Discharge Summary (Signed)
Patient ID: Marisa [name] MRN: [id number] DOB/AGE: November 04, 1940 71 y.o.  Admit date: 09/29/2011 Discharge date: 10/08/2011  Primary Care Physician:  Juline Patch, MD  Discharge Diagnoses:     Principal Problems:  *SIRS (systemic inflammatory response syndrome)  Abscess of buttock, right with cultures growing MRSA  Diabetes mellitus type 2, insulin dependent with  Episodes of hypoglycemia and mild DKA   active problems  Hypokalemia  HYPERTENSION  COPD  GERD  Rheumatoid arthritis  Chronic anemia  Hyponatremia  Sinus tachycardia   Medication List  As of 10/08/2011 10:49 AM   STOP taking these medications         HYDROcodone-acetaminophen 5-500 MG per tablet      INSULIN ASPART [place]         TAKE these medications         albuterol (5 MG/ML) 0.5% nebulizer solution   Commonly known as: PROVENTIL   Take 0.5 mLs (2.5 mg total) by nebulization every 6 (six) hours as needed for wheezing or shortness of breath.      budesonide 180 MCG/ACT inhaler   Commonly known as: PULMICORT   Inhale 1 puff into the lungs.      diltiazem 30 MG tablet   Commonly known as: CARDIZEM   Take 1 tablet (30 mg total) by mouth every 8 (eight) hours.      docusate sodium 100 MG capsule   Commonly known as: COLACE   Take 1 capsule (100 mg total) by mouth daily as needed for constipation. Daily as needed      esomeprazole 40 MG capsule   Commonly known as: NEXIUM   Take 40 mg by mouth daily before breakfast. Take 1 capsule twice daily.      estrogens (conjugated) 0.625 MG tablet   Commonly known as: PREMARIN   Take 0.625 mg by mouth daily. Take daily for 21 days then do not take for 7 days.      feeding supplement Liqd   Take 1 Container by mouth daily after supper.      feeding supplement Liqd   Take 30 mLs by mouth 2 (two) times daily after a meal.      ferrous fumarate-b12-vitamic C-folic acid capsule   Commonly known as: TRINSICON / FOLTRIN   Take 1 capsule by mouth daily before  breakfast.      folic acid 1 MG tablet   Commonly known as: FOLVITE   Take 1 mg by mouth daily.      glucosamine-chondroitin 500-400 MG tablet   Take 1 tablet by mouth 3 (three) times daily.      HYDROcodone-acetaminophen 5-325 MG per tablet   Commonly known as: NORCO   Take 1-2 tablets by mouth every 4 (four) hours as needed.      hydrocortisone 2.5 % cream   Apply topically 3 (three) times daily. Use rectally      insulin detemir 100 UNIT/ML injection   Commonly known as: LEVEMIR   Inject 10 Units into the skin daily. 20U AT NOON      INTEGRA PLUS Caps   Take 1 capsule by mouth. Take 1 cap daily.      leflunomide 20 MG tablet   Commonly known as: ARAVA   Take 20 mg by mouth daily.      levalbuterol 45 MCG/ACT inhaler   Commonly known as: XOPENEX HFA   Inhale 1-2 puffs into the lungs every 4 (four) hours as needed. 1-2 puffs every 6 hours as needed.  lisinopril 20 MG tablet   Commonly known as: PRINIVIL,ZESTRIL   Take 10 mg by mouth daily.      LORazepam 1 MG tablet   Commonly known as: ATIVAN   Take 1 mg by mouth every 8 (eight) hours. 1 in the am and 2 in the pm      LYRICA 75 MG capsule   Generic drug: pregabalin   Take 75 mg by mouth 3 (three) times daily. 1 IN THE AM AND 2 IN THE PM      magnesium oxide 400 MG tablet   Commonly known as: MAG-OX   Take 400 mg by mouth daily.      metFORMIN 500 MG tablet   Commonly known as: GLUCOPHAGE   Take 500 mg by mouth 2 (two) times daily with a meal.      MULTIVITAMINS PO   Take 1 tablet by mouth. Daily.      potassium chloride SA 20 MEQ tablet   Commonly known as: K-DUR,KLOR-CON   Take 20 mEq by mouth. Take 1 tab twice daily.      predniSONE 10 MG tablet   Commonly known as: DELTASONE   Take 10 mg by mouth daily.      rosuvastatin 10 MG tablet   Commonly known as: CRESTOR   Take 10 mg by mouth daily.      sulfamethoxazole-trimethoprim 800-160 MG per tablet   Commonly known as: BACTRIM DS,SEPTRA DS    Take 1 tablet by mouth 2 (two) times daily.      TOPROL XL 100 MG 24 hr tablet   Generic drug: metoprolol succinate   Take 100 mg by mouth daily.      traMADol 50 MG tablet   Commonly known as: ULTRAM   Take 50 mg by mouth every 6 (six) hours as needed. Take 1 tab every 4 hours as needed for pain.            Disposition and Follow-up:  To [facility] with [facility] follow up with PCP and surgery  Wound dressing with pulse lavage 6 x week . Dressing with saline gauge with allevyn dressing to cover the wound on top .  Please provide with pain medications prior to hydrotherapy   Consults:   Lodema Pilot ( surgery)  Significant Diagnostic Studies:  Ct Pelvis W Contrast  09/29/2011  *RADIOLOGY REPORT*  Clinical Data:  Soft tissue infection in the right gluteal region.  CT PELVIS WITH CONTRAST  Technique:  Multidetector CT imaging of the pelvis was performed using the standard protocol following the bolus administration of intravenous contrast.  Contrast: OMNIPAQUE IOHEXOL 300 MG/ML  SOLN  Comparison:  11/26/2010 MRI.  Findings:  Marked inflammatory changes are present in the dorsal right gluteal subcutaneous fat.  There is no well-defined abscess. No gas is present in the soft tissues.  Inflammatory changes extend up to the right side of the gluteal cleft.  Hysterectomy.  Visceral pelvis grossly appears within normal limits aside from a cystic lesion in the right anatomic pelvis, compatible with a small right ovarian cyst.  There is no inguinal adenopathy.  No iliac adenopathy.  No destructive osseous lesions are present.  Lumbar spondylosis at L4-L5 and L5-S1 is severe.  Bilateral obturator ring nonunion is present.  IMPRESSION:  1.  Phlegmon in the right gluteal soft tissues without abscess or gas.  Findings compatible with superficial soft tissue infection. No extension of the deep pelvic tissues. 2.  Bilateral obturator ring nonunion.  Original Report  Authenticated By: Andreas Newport,  M.D.    Brief H and P: For complete details please refer to admission H and P, but in brief 71 yo Female  with h/o RA on steroids/leflunomide, DM/insulin, NSTEMI 11/2010 but normal myoview and  clean cath 08/2004 , chronic anemia, asthma/COPD on home 2.5L [place], presents  with R buttock cellulitis, SIRS, hyperglycemic.   Pt is reliable historian, and with husband they state she had a rash on  right buttock that's progressively gotten worse for the past week, and  eventually over past couple days pt spiked fevers to 101 at home,  prompting evaluation. There is no purulent drainage, although it did seem  to start with a small pimple, but nothing has drained through the week.  In the ED, Tmax 99.1, tachy to 115. HypoNa 130, hyperglycemic 310, WBC  18.1, Hgb 8.1 stable compared to 11/2010. BCx x2 pending. CT pelvis with  contrast showed phlegmon in right gluteal soft tissue, no abscess or gas.  Pt was given 1L of NS and vancomycin.      Physical Exam on Discharge:  Filed Vitals:   10/08/11 0156 10/08/11 0314 10/08/11 0623 10/08/11 0933  BP: 184/80 191/71 183/80 116/69  Pulse: 90 90 92 104  Temp: 97.5 F (36.4 C) 97.7 F (36.5 C) 98.4 F (36.9 C) 97.5 F (36.4 C)  TempSrc: Oral Oral Oral Oral  Resp: 18 19 18 18   Height:      Weight:      SpO2: 100% 90% 98% 99%     Intake/Output Summary (Last 24 hours) at 10/08/11 1049 Last data filed at 10/08/11 0930  Gross per 24 hour  Intake   2410 ml  Output   3075 ml  Net   -665 ml   General: elderly female in no acute distress. Appears anxious  HEENT: no pallor, no icterus, moist oral mucosa, no JVD, no lymphadenopathy  Heart: s1 &s2 normal,occasionally tachy to low 100s. Regular rhythm, without murmurs, rubs, gallops.  Lungs: Clear to auscultation bilaterally.  Abdomen: Soft, nontender, nondistended, positive bowel sounds.  Extremities: dressing over right gluteal wound which appears clean. No clubbing cyanosis, no edema with positive  pedal pulses. Foley in place  Neuro: Alert, awake, oriented x3, nonfocal.   CBC:    Component Value Date/Time   WBC 7.2 10/08/2011 0355   HGB 8.9* 10/08/2011 0355   HCT 28.3* 10/08/2011 0355   PLT 452* 10/08/2011 0355   MCV 85.0 10/08/2011 0355   NEUTROABS 14.2* 10/04/2011 1135   LYMPHSABS 1.2 10/04/2011 1135   MONOABS 1.2* 10/04/2011 1135   EOSABS 0.2 10/04/2011 1135   BASOSABS 0.0 10/04/2011 1135    Basic Metabolic Panel:    Component Value Date/Time   NA 135 10/07/2011 0405   K 4.2 10/07/2011 0405   CL 105 10/07/2011 0405   CO2 24 10/07/2011 0405   BUN 12 10/07/2011 0405   CREATININE 0.81 10/07/2011 0405   GLUCOSE 162* 10/07/2011 0405   CALCIUM 8.3* 10/07/2011 0405    Hospital Course:   SIRS with MRSA cellulitis and abscess  of right buttocks  s/p Complex drainage on 6/17 by surgery -.  - culture growing MRSA, on vanco ( day 8), will be transitioned to 14 day course of po bactrim  on discharge. ( until 07/01).  -blood cx from 6/16 ( 1 out of 2) grew bacteroides which likely is a contaminant. Discussed with ID Dr Luciana Axe who recommends to the same and treat only for MRSA given symptomatic  improvement.  Continue pain control with tramadol and Vicodin. recommend pain medication prior to wound dressing.  -wound appears to be clean and improving and getting pulse lavage therapy 6 x a week with saline gauge dressing . -PT eval recommends LTAC for better wound care and patient has [facility] at [facility]. Follow up with surgery ( Dr Biagio Quint) in 1-2 weeks   Uncontrolled DM with DKA  Patient was having low fsg overnight for few days ( due to poor po intake and infection  for which levemir was dced ) On 6/23 she had elevated fsg to 500 with mild DKA and placed on insulin drip for few hrs. Subsequent fsg have been stable with normal electrolytes . Her fsg is now stable on 10 units of levemir and sliding scale insulin. resume home   Tachycardia  note for HR in 110-120s for several days, possibly  due to infection and dehydration. Now improving On toprol xl. And cardizem which was added.  also given IV fluids with improvment No EKG changes noted  TSH wnl  CXR wnl. Pro BNP elevated . 2D echo showed  normal EF   Anemia  -likely secondary to acute blood loss from surgery,given 1u PRBC on 6/18. Stable at 8.9 today   H/oNSTEMI in 11/2010  had a normal Myoview and positive troponins thought secondary to acute illness ( DKA) at that time. Would keep on beta blocker at present time.. Follow up with cardiology as outpt  Elevated Pro BP with persistent tachycardia  2d echo with normal EF and grade 1 diastolic dysfn   Rheumatoid arthritis-  Cont with prednisone and leflunamide   HTN  Continue toprol, lisinopril and cardizem. BP stable  COPD: stable on 02 via [place] ( 2.5l) , pulmicort. Prn albuterol   Protein calorie malnutrition  albumin of 1.9 only  appreciate nutrition recommendation for supplements    [facility] with outpatient follow up with PCP and surgery     Time spent on Discharge: 45 minutes  Signed: Eddie North 10/08/2011, 10:49 AM

## 2011-10-08 NOTE — Progress Notes (Signed)
Patient has been accepted to [facility]. The facility ordered a specialty bed for the patient that will be there after 3pm today and will accept the patient after that time. Report to be called to [phone] ext [phone] Room 321 Accepting MD is Dr Angelina Ok DC summary to be faxed to [phone] Transportation to be provided by [facility] ambulance

## 2011-10-08 NOTE — Progress Notes (Signed)
Talked to Driscilla Grammes [phone]) with [facility], will have bed available for the patient today; Attending MD made aware; Abelino Derrick RN, BSN, [place].

## 2011-10-08 NOTE — Progress Notes (Signed)
8 Days Post-Op  Subjective: She is packed up to go to LTAC, her dressing was done earlier so I did not see her today.  Objective: Vital signs in last 24 hours: Temp:  [97.5 F (36.4 C)-98.4 F (36.9 C)] 97.5 F (36.4 C) (06/25 0933) Pulse Rate:  [90-104] 104  (06/25 0933) Resp:  [18-19] 18  (06/25 0933) BP: (116-191)/(69-100) 116/69 mmHg (06/25 0933) SpO2:  [90 %-100 %] 98 % (06/25 1136) Last BM Date: 10/07/11  Intake/Output from previous day: 06/24 0701 - 06/25 0700 In: 2410 [P.O.:360; I.V.:1950; IV Piggyback:100] Out: 2350 [Urine:2350] Intake/Output this shift: Total I/O In: 240 [P.O.:240] Out: 725 [Urine:725]  General appearance: alert, cooperative and no distress  Lab Results:   [facility] 10/08/11 0355 10/07/11 0405  WBC 7.2 9.6  HGB 8.9* 8.9*  HCT 28.3* 27.8*  PLT 452* 439*    BMET  Basename 10/07/11 0405 10/06/11 1425  NA 135 130*  K 4.2 4.4  CL 105 100  CO2 24 21  GLUCOSE 162* 176*  BUN 12 18  CREATININE 0.81 0.95  CALCIUM 8.3* 8.5   PT/INR No results found for this basename: LABPROT:2,INR:2 in the last 72 hours  No results found for this basename: AST:5,ALT:5,ALKPHOS:5,BILITOT:5,PROT:5,ALBUMIN:5 in the last 168 hours   Lipase     Component Value Date/Time   LIPASE 22 12/07/2008 0505     Studies/Results: No results found.  Medications:    . diltiazem  30 mg Oral Q8H  . docusate sodium  100 mg Oral BID  . enoxaparin  40 mg Subcutaneous QHS  . feeding supplement  30 mL Oral BID PC  . feeding supplement  1 Container Oral QPC supper  . fluticasone  1 puff Inhalation BID  . insulin aspart  0-15 Units Subcutaneous TID WC  . insulin detemir  10 Units Subcutaneous Daily  . leflunomide  20 mg Oral Daily  . lisinopril  10 mg Oral Daily  . LORazepam  1 mg Oral Q8H  . metoprolol succinate  100 mg Oral Daily  . nystatin   Topical BID  . predniSONE  10 mg Oral Daily  . senna  1 tablet Oral BID  . vancomycin  500 mg Intravenous Q12H     Assessment/Plan INCISION AND DRAINAGE right perirectal abscess - T. MRSA - [place] - 09/30/2011  Rheumatoid arthritis,On chronic steroids - Prednisone 10 mg QD  COPD Diabetes mellitus type 2, insulin dependent, Poor control  Chronic anemia  OSA - on home O2  DVT prophylaxis - Lovenox   Plan:  She is for transfer to LTAC.  The wound looked good yesterday, nothing else needs surgical debridement.  She;s doing well with wet to dry, and hydro Rx.  I would continue that at the [facility].  We did not see a way to obtain seal for wound vac.       LOS: 9 days    Marisa [name] 10/08/2011

## 2011-10-08 NOTE — Discharge Instructions (Signed)

## 2011-10-09 DIAGNOSIS — E43 Unspecified severe protein-calorie malnutrition: Secondary | ICD-10-CM | POA: Diagnosis not present

## 2011-10-09 DIAGNOSIS — M069 Rheumatoid arthritis, unspecified: Secondary | ICD-10-CM | POA: Diagnosis not present

## 2011-10-09 DIAGNOSIS — L03317 Cellulitis of buttock: Secondary | ICD-10-CM | POA: Diagnosis not present

## 2011-10-09 DIAGNOSIS — J15212 Pneumonia due to Methicillin resistant Staphylococcus aureus: Secondary | ICD-10-CM | POA: Diagnosis not present

## 2011-10-09 LAB — CULTURE, BLOOD (ROUTINE X 2)

## 2011-10-10 DIAGNOSIS — M069 Rheumatoid arthritis, unspecified: Secondary | ICD-10-CM | POA: Diagnosis not present

## 2011-10-10 DIAGNOSIS — L03317 Cellulitis of buttock: Secondary | ICD-10-CM | POA: Diagnosis not present

## 2011-10-10 DIAGNOSIS — E43 Unspecified severe protein-calorie malnutrition: Secondary | ICD-10-CM | POA: Diagnosis not present

## 2011-10-10 DIAGNOSIS — J15212 Pneumonia due to Methicillin resistant Staphylococcus aureus: Secondary | ICD-10-CM | POA: Diagnosis not present

## 2011-10-11 DIAGNOSIS — L03317 Cellulitis of buttock: Secondary | ICD-10-CM | POA: Diagnosis not present

## 2011-10-11 DIAGNOSIS — J15212 Pneumonia due to Methicillin resistant Staphylococcus aureus: Secondary | ICD-10-CM | POA: Diagnosis not present

## 2011-10-11 DIAGNOSIS — M069 Rheumatoid arthritis, unspecified: Secondary | ICD-10-CM | POA: Diagnosis not present

## 2011-10-11 DIAGNOSIS — E43 Unspecified severe protein-calorie malnutrition: Secondary | ICD-10-CM | POA: Diagnosis not present

## 2011-10-14 DIAGNOSIS — L03317 Cellulitis of buttock: Secondary | ICD-10-CM | POA: Diagnosis not present

## 2011-10-14 DIAGNOSIS — M069 Rheumatoid arthritis, unspecified: Secondary | ICD-10-CM | POA: Diagnosis not present

## 2011-10-14 DIAGNOSIS — E43 Unspecified severe protein-calorie malnutrition: Secondary | ICD-10-CM | POA: Diagnosis not present

## 2011-10-14 DIAGNOSIS — J15212 Pneumonia due to Methicillin resistant Staphylococcus aureus: Secondary | ICD-10-CM | POA: Diagnosis not present

## 2011-10-15 DIAGNOSIS — E43 Unspecified severe protein-calorie malnutrition: Secondary | ICD-10-CM | POA: Diagnosis not present

## 2011-10-15 DIAGNOSIS — L0231 Cutaneous abscess of buttock: Secondary | ICD-10-CM | POA: Diagnosis not present

## 2011-10-15 DIAGNOSIS — J15212 Pneumonia due to Methicillin resistant Staphylococcus aureus: Secondary | ICD-10-CM | POA: Diagnosis not present

## 2011-10-15 DIAGNOSIS — M069 Rheumatoid arthritis, unspecified: Secondary | ICD-10-CM | POA: Diagnosis not present

## 2011-10-16 DIAGNOSIS — E43 Unspecified severe protein-calorie malnutrition: Secondary | ICD-10-CM | POA: Diagnosis not present

## 2011-10-16 DIAGNOSIS — J15212 Pneumonia due to Methicillin resistant Staphylococcus aureus: Secondary | ICD-10-CM | POA: Diagnosis not present

## 2011-10-16 DIAGNOSIS — L03317 Cellulitis of buttock: Secondary | ICD-10-CM | POA: Diagnosis not present

## 2011-10-16 DIAGNOSIS — M069 Rheumatoid arthritis, unspecified: Secondary | ICD-10-CM | POA: Diagnosis not present

## 2011-10-17 DIAGNOSIS — L0231 Cutaneous abscess of buttock: Secondary | ICD-10-CM | POA: Diagnosis not present

## 2011-10-17 DIAGNOSIS — E43 Unspecified severe protein-calorie malnutrition: Secondary | ICD-10-CM | POA: Diagnosis not present

## 2011-10-17 DIAGNOSIS — M069 Rheumatoid arthritis, unspecified: Secondary | ICD-10-CM | POA: Diagnosis not present

## 2011-10-17 DIAGNOSIS — J15212 Pneumonia due to Methicillin resistant Staphylococcus aureus: Secondary | ICD-10-CM | POA: Diagnosis not present

## 2011-10-21 DIAGNOSIS — M069 Rheumatoid arthritis, unspecified: Secondary | ICD-10-CM | POA: Diagnosis not present

## 2011-10-21 DIAGNOSIS — L0231 Cutaneous abscess of buttock: Secondary | ICD-10-CM | POA: Diagnosis not present

## 2011-10-21 DIAGNOSIS — J15212 Pneumonia due to Methicillin resistant Staphylococcus aureus: Secondary | ICD-10-CM | POA: Diagnosis not present

## 2011-10-21 DIAGNOSIS — E43 Unspecified severe protein-calorie malnutrition: Secondary | ICD-10-CM | POA: Diagnosis not present

## 2011-10-22 DIAGNOSIS — M069 Rheumatoid arthritis, unspecified: Secondary | ICD-10-CM | POA: Diagnosis not present

## 2011-10-22 DIAGNOSIS — J15212 Pneumonia due to Methicillin resistant Staphylococcus aureus: Secondary | ICD-10-CM | POA: Diagnosis not present

## 2011-10-22 DIAGNOSIS — L03317 Cellulitis of buttock: Secondary | ICD-10-CM | POA: Diagnosis not present

## 2011-10-22 DIAGNOSIS — E43 Unspecified severe protein-calorie malnutrition: Secondary | ICD-10-CM | POA: Diagnosis not present

## 2011-10-23 DIAGNOSIS — M069 Rheumatoid arthritis, unspecified: Secondary | ICD-10-CM | POA: Diagnosis not present

## 2011-10-23 DIAGNOSIS — L0231 Cutaneous abscess of buttock: Secondary | ICD-10-CM | POA: Diagnosis not present

## 2011-10-23 DIAGNOSIS — J15212 Pneumonia due to Methicillin resistant Staphylococcus aureus: Secondary | ICD-10-CM | POA: Diagnosis not present

## 2011-10-23 DIAGNOSIS — E43 Unspecified severe protein-calorie malnutrition: Secondary | ICD-10-CM | POA: Diagnosis not present

## 2011-10-24 DIAGNOSIS — J15212 Pneumonia due to Methicillin resistant Staphylococcus aureus: Secondary | ICD-10-CM | POA: Diagnosis not present

## 2011-10-24 DIAGNOSIS — E43 Unspecified severe protein-calorie malnutrition: Secondary | ICD-10-CM | POA: Diagnosis not present

## 2011-10-24 DIAGNOSIS — L0231 Cutaneous abscess of buttock: Secondary | ICD-10-CM | POA: Diagnosis not present

## 2011-10-24 DIAGNOSIS — M069 Rheumatoid arthritis, unspecified: Secondary | ICD-10-CM | POA: Diagnosis not present

## 2011-10-24 DIAGNOSIS — L03317 Cellulitis of buttock: Secondary | ICD-10-CM | POA: Diagnosis not present

## 2011-10-25 DIAGNOSIS — E43 Unspecified severe protein-calorie malnutrition: Secondary | ICD-10-CM | POA: Diagnosis not present

## 2011-10-25 DIAGNOSIS — M069 Rheumatoid arthritis, unspecified: Secondary | ICD-10-CM | POA: Diagnosis not present

## 2011-10-25 DIAGNOSIS — J15212 Pneumonia due to Methicillin resistant Staphylococcus aureus: Secondary | ICD-10-CM | POA: Diagnosis not present

## 2011-10-25 DIAGNOSIS — L0231 Cutaneous abscess of buttock: Secondary | ICD-10-CM | POA: Diagnosis not present

## 2011-10-26 DIAGNOSIS — M069 Rheumatoid arthritis, unspecified: Secondary | ICD-10-CM | POA: Diagnosis not present

## 2011-10-26 DIAGNOSIS — J15212 Pneumonia due to Methicillin resistant Staphylococcus aureus: Secondary | ICD-10-CM | POA: Diagnosis not present

## 2011-10-26 DIAGNOSIS — L0231 Cutaneous abscess of buttock: Secondary | ICD-10-CM | POA: Diagnosis not present

## 2011-10-26 DIAGNOSIS — E43 Unspecified severe protein-calorie malnutrition: Secondary | ICD-10-CM | POA: Diagnosis not present

## 2011-10-27 DIAGNOSIS — E43 Unspecified severe protein-calorie malnutrition: Secondary | ICD-10-CM | POA: Diagnosis not present

## 2011-10-27 DIAGNOSIS — L0231 Cutaneous abscess of buttock: Secondary | ICD-10-CM | POA: Diagnosis not present

## 2011-10-27 DIAGNOSIS — M069 Rheumatoid arthritis, unspecified: Secondary | ICD-10-CM | POA: Diagnosis not present

## 2011-10-27 DIAGNOSIS — J15212 Pneumonia due to Methicillin resistant Staphylococcus aureus: Secondary | ICD-10-CM | POA: Diagnosis not present

## 2011-10-28 DIAGNOSIS — J15212 Pneumonia due to Methicillin resistant Staphylococcus aureus: Secondary | ICD-10-CM | POA: Diagnosis not present

## 2011-10-28 DIAGNOSIS — E43 Unspecified severe protein-calorie malnutrition: Secondary | ICD-10-CM | POA: Diagnosis not present

## 2011-10-28 DIAGNOSIS — L0231 Cutaneous abscess of buttock: Secondary | ICD-10-CM | POA: Diagnosis not present

## 2011-10-28 DIAGNOSIS — M069 Rheumatoid arthritis, unspecified: Secondary | ICD-10-CM | POA: Diagnosis not present

## 2012-02-26 ENCOUNTER — Observation Stay (HOSPITAL_COMMUNITY)
Admission: EM | Admit: 2012-02-26 | Discharge: 2012-02-27 | Disposition: A | Discharge status: Home / Self Care | Payer: Medicare Other | Attending: Internal Medicine | Admitting: Internal Medicine

## 2012-02-26 ENCOUNTER — Encounter (HOSPITAL_COMMUNITY): Payer: Self-pay | Admitting: Emergency Medicine

## 2012-02-26 DIAGNOSIS — K2970 Gastritis, unspecified, without bleeding: Secondary | ICD-10-CM

## 2012-02-26 DIAGNOSIS — Z79899 Other long term (current) drug therapy: Secondary | ICD-10-CM | POA: Diagnosis not present

## 2012-02-26 DIAGNOSIS — L0231 Cutaneous abscess of buttock: Secondary | ICD-10-CM

## 2012-02-26 DIAGNOSIS — J309 Allergic rhinitis, unspecified: Secondary | ICD-10-CM

## 2012-02-26 DIAGNOSIS — K219 Gastro-esophageal reflux disease without esophagitis: Secondary | ICD-10-CM

## 2012-02-26 DIAGNOSIS — E559 Vitamin D deficiency, unspecified: Secondary | ICD-10-CM | POA: Diagnosis not present

## 2012-02-26 DIAGNOSIS — I252 Old myocardial infarction: Secondary | ICD-10-CM | POA: Insufficient documentation

## 2012-02-26 DIAGNOSIS — I10 Essential (primary) hypertension: Secondary | ICD-10-CM

## 2012-02-26 DIAGNOSIS — K648 Other hemorrhoids: Secondary | ICD-10-CM

## 2012-02-26 DIAGNOSIS — E119 Type 2 diabetes mellitus without complications: Secondary | ICD-10-CM | POA: Diagnosis not present

## 2012-02-26 DIAGNOSIS — F411 Generalized anxiety disorder: Secondary | ICD-10-CM

## 2012-02-26 DIAGNOSIS — R6510 Systemic inflammatory response syndrome (SIRS) of non-infectious origin without acute organ dysfunction: Secondary | ICD-10-CM | POA: Diagnosis not present

## 2012-02-26 DIAGNOSIS — R000 Tachycardia, unspecified: Secondary | ICD-10-CM

## 2012-02-26 DIAGNOSIS — I309 Acute pericarditis, unspecified: Secondary | ICD-10-CM

## 2012-02-26 DIAGNOSIS — K589 Irritable bowel syndrome without diarrhea: Secondary | ICD-10-CM

## 2012-02-26 DIAGNOSIS — Z23 Encounter for immunization: Secondary | ICD-10-CM | POA: Insufficient documentation

## 2012-02-26 DIAGNOSIS — J45909 Unspecified asthma, uncomplicated: Secondary | ICD-10-CM | POA: Diagnosis not present

## 2012-02-26 DIAGNOSIS — G4730 Sleep apnea, unspecified: Secondary | ICD-10-CM

## 2012-02-26 DIAGNOSIS — R0602 Shortness of breath: Secondary | ICD-10-CM | POA: Diagnosis present

## 2012-02-26 DIAGNOSIS — D509 Iron deficiency anemia, unspecified: Principal | ICD-10-CM | POA: Insufficient documentation

## 2012-02-26 DIAGNOSIS — R51 Headache: Secondary | ICD-10-CM

## 2012-02-26 DIAGNOSIS — K5909 Other constipation: Secondary | ICD-10-CM

## 2012-02-26 DIAGNOSIS — E871 Hypo-osmolality and hyponatremia: Secondary | ICD-10-CM | POA: Diagnosis not present

## 2012-02-26 DIAGNOSIS — N179 Acute kidney failure, unspecified: Secondary | ICD-10-CM | POA: Diagnosis present

## 2012-02-26 DIAGNOSIS — E876 Hypokalemia: Secondary | ICD-10-CM

## 2012-02-26 DIAGNOSIS — C44300 Unspecified malignant neoplasm of skin of unspecified part of face: Secondary | ICD-10-CM

## 2012-02-26 DIAGNOSIS — M069 Rheumatoid arthritis, unspecified: Secondary | ICD-10-CM | POA: Diagnosis present

## 2012-02-26 DIAGNOSIS — I499 Cardiac arrhythmia, unspecified: Secondary | ICD-10-CM

## 2012-02-26 DIAGNOSIS — K222 Esophageal obstruction: Secondary | ICD-10-CM

## 2012-02-26 DIAGNOSIS — J449 Chronic obstructive pulmonary disease, unspecified: Secondary | ICD-10-CM

## 2012-02-26 DIAGNOSIS — E611 Iron deficiency: Secondary | ICD-10-CM | POA: Diagnosis present

## 2012-02-26 DIAGNOSIS — D649 Anemia, unspecified: Secondary | ICD-10-CM

## 2012-02-26 DIAGNOSIS — B9562 Methicillin resistant Staphylococcus aureus infection as the cause of diseases classified elsewhere: Secondary | ICD-10-CM

## 2012-02-26 DIAGNOSIS — Z794 Long term (current) use of insulin: Secondary | ICD-10-CM | POA: Diagnosis not present

## 2012-02-26 DIAGNOSIS — E7800 Pure hypercholesterolemia, unspecified: Secondary | ICD-10-CM | POA: Diagnosis not present

## 2012-02-26 DIAGNOSIS — J40 Bronchitis, not specified as acute or chronic: Secondary | ICD-10-CM

## 2012-02-26 DIAGNOSIS — Z9981 Dependence on supplemental oxygen: Secondary | ICD-10-CM | POA: Insufficient documentation

## 2012-02-26 DIAGNOSIS — I498 Other specified cardiac arrhythmias: Secondary | ICD-10-CM | POA: Diagnosis not present

## 2012-02-26 DIAGNOSIS — D126 Benign neoplasm of colon, unspecified: Secondary | ICD-10-CM

## 2012-02-26 DIAGNOSIS — E46 Unspecified protein-calorie malnutrition: Secondary | ICD-10-CM

## 2012-02-26 HISTORY — DX: Personal history of other medical treatment: Z92.89

## 2012-02-26 LAB — BASIC METABOLIC PANEL
BUN: 21 mg/dL (ref 6–23)
Calcium: 8.3 mg/dL — ABNORMAL LOW (ref 8.4–10.5)
GFR calc non Af Amer: 43 mL/min — ABNORMAL LOW (ref >90)
Glucose, Bld: 342 mg/dL — ABNORMAL HIGH (ref 70–99)

## 2012-02-26 LAB — CBC WITH DIFFERENTIAL/PLATELET
Basophils Relative: 0 % (ref 0–1)
Eosinophils Absolute: 0 10*3/uL (ref 0.0–0.7)
HCT: 23.4 % — ABNORMAL LOW (ref 36.0–46.0)
Hemoglobin: 7 g/dL — ABNORMAL LOW (ref 12.0–15.0)
Lymphs Abs: 0.5 10*3/uL — ABNORMAL LOW (ref 0.7–4.0)
MCH: 20.8 pg — ABNORMAL LOW (ref 26.0–34.0)
MCHC: 29.9 g/dL — ABNORMAL LOW (ref 30.0–36.0)
MCV: 69.4 fL — ABNORMAL LOW (ref 78.0–100.0)
Monocytes Absolute: 0.4 10*3/uL (ref 0.1–1.0)
Neutro Abs: 8.1 10*3/uL — ABNORMAL HIGH (ref 1.7–7.7)

## 2012-02-26 LAB — GLUCOSE, CAPILLARY: Glucose-Capillary: 142 mg/dL — ABNORMAL HIGH (ref 70–99)

## 2012-02-26 MED ORDER — SALINE SPRAY 0.65 % NA SOLN
1.0000 | NASAL | Status: DC | PRN
  Filled 2012-02-26 (×2): qty 44

## 2012-02-26 MED ORDER — ACETAMINOPHEN 650 MG RE SUPP: 650.0000 mg | Freq: Four times a day (QID) | RECTAL | Status: DC | PRN

## 2012-02-26 MED ORDER — SODIUM CHLORIDE 0.9 % IJ SOLN: 3.0000 mL | INTRAMUSCULAR | Status: DC | PRN

## 2012-02-26 MED ORDER — LORAZEPAM 1 MG PO TABS
1.0000 mg | ORAL_TABLET | Freq: Three times a day (TID) | ORAL | Status: DC
  Administered 2012-02-26 – 2012-02-27 (×3): 1 mg via ORAL
  Filled 2012-02-26 (×3): qty 1

## 2012-02-26 MED ORDER — HYDROCORTISONE 2.5 % EX CREA: TOPICAL_CREAM | Freq: Three times a day (TID) | CUTANEOUS | Status: DC

## 2012-02-26 MED ORDER — HYDROCODONE-ACETAMINOPHEN 5-325 MG PO TABS
1.0000 | ORAL_TABLET | Freq: Four times a day (QID) | ORAL | Status: DC | PRN
  Administered 2012-02-27: 2 via ORAL
  Administered 2012-02-27: 1 via ORAL
  Filled 2012-02-26: qty 2
  Filled 2012-02-26: qty 1

## 2012-02-26 MED ORDER — DOCUSATE SODIUM 100 MG PO CAPS
100.0000 mg | ORAL_CAPSULE | Freq: Every day | ORAL | Status: DC | PRN
  Filled 2012-02-26: qty 1

## 2012-02-26 MED ORDER — FLUTICASONE PROPIONATE HFA 44 MCG/ACT IN AERO
2.0000 | INHALATION_SPRAY | Freq: Two times a day (BID) | RESPIRATORY_TRACT | Status: DC
  Administered 2012-02-26 – 2012-02-27 (×2): 2 via RESPIRATORY_TRACT
  Filled 2012-02-26: qty 10.6

## 2012-02-26 MED ORDER — INSULIN ASPART 100 UNIT/ML ~~LOC~~ SOLN: 0.0000 [IU] | Freq: Every day | SUBCUTANEOUS | Status: DC

## 2012-02-26 MED ORDER — ATORVASTATIN CALCIUM 20 MG PO TABS
20.0000 mg | ORAL_TABLET | Freq: Every day | ORAL | Status: DC
  Filled 2012-02-26: qty 1

## 2012-02-26 MED ORDER — PREDNISONE 10 MG PO TABS
10.0000 mg | ORAL_TABLET | Freq: Every day | ORAL | Status: DC
  Filled 2012-02-26: qty 1

## 2012-02-26 MED ORDER — ONDANSETRON HCL 4 MG/2ML IJ SOLN: 4.0000 mg | Freq: Four times a day (QID) | INTRAMUSCULAR | Status: DC | PRN

## 2012-02-26 MED ORDER — ACETAMINOPHEN 325 MG PO TABS: 650.0000 mg | ORAL_TABLET | Freq: Four times a day (QID) | ORAL | Status: DC | PRN

## 2012-02-26 MED ORDER — SODIUM CHLORIDE 0.9 % IV SOLN: 250.0000 mL | INTRAVENOUS | Status: DC | PRN

## 2012-02-26 MED ORDER — SODIUM CHLORIDE 0.9 % IV SOLN
INTRAVENOUS | Status: DC
  Administered 2012-02-26 – 2012-02-27 (×2): via INTRAVENOUS

## 2012-02-26 MED ORDER — SODIUM CHLORIDE 0.9 % IJ SOLN: 3.0000 mL | Freq: Two times a day (BID) | INTRAMUSCULAR | Status: DC

## 2012-02-26 MED ORDER — ONDANSETRON HCL 4 MG PO TABS: 4.0000 mg | ORAL_TABLET | Freq: Four times a day (QID) | ORAL | Status: DC | PRN

## 2012-02-26 MED ORDER — LEFLUNOMIDE 20 MG PO TABS
20.0000 mg | ORAL_TABLET | Freq: Every day | ORAL | Status: DC
  Administered 2012-02-26 – 2012-02-27 (×2): 20 mg via ORAL
  Filled 2012-02-26 (×2): qty 1

## 2012-02-26 MED ORDER — ESTROGENS CONJUGATED 0.625 MG PO TABS
0.6250 mg | ORAL_TABLET | Freq: Every day | ORAL | Status: DC
  Administered 2012-02-27: 0.625 mg via ORAL
  Filled 2012-02-26: qty 1

## 2012-02-26 MED ORDER — HYDRALAZINE HCL 20 MG/ML IJ SOLN
10.0000 mg | Freq: Once | INTRAMUSCULAR | Status: AC
  Administered 2012-02-26: 10 mg via INTRAVENOUS
  Filled 2012-02-26: qty 1

## 2012-02-26 MED ORDER — ALBUTEROL SULFATE (5 MG/ML) 0.5% IN NEBU: 2.5000 mg | INHALATION_SOLUTION | Freq: Four times a day (QID) | RESPIRATORY_TRACT | Status: DC | PRN

## 2012-02-26 MED ORDER — PREGABALIN 50 MG PO CAPS
75.0000 mg | ORAL_CAPSULE | Freq: Three times a day (TID) | ORAL | Status: DC
  Administered 2012-02-26 – 2012-02-27 (×2): 75 mg via ORAL
  Filled 2012-02-26 (×2): qty 1

## 2012-02-26 MED ORDER — INSULIN DETEMIR 100 UNIT/ML ~~LOC~~ SOLN
20.0000 [IU] | Freq: Every day | SUBCUTANEOUS | Status: DC
  Administered 2012-02-27: 20 [IU] via SUBCUTANEOUS
  Filled 2012-02-26: qty 10

## 2012-02-26 MED ORDER — INFLUENZA VIRUS VACC SPLIT PF IM SUSP
0.5000 mL | INTRAMUSCULAR | Status: AC
  Administered 2012-02-27: 0.5 mL via INTRAMUSCULAR
  Filled 2012-02-26: qty 0.5

## 2012-02-26 MED ORDER — FOLIC ACID 1 MG PO TABS
1.0000 mg | ORAL_TABLET | Freq: Every day | ORAL | Status: DC
  Administered 2012-02-27: 1 mg via ORAL
  Filled 2012-02-26: qty 1

## 2012-02-26 MED ORDER — METOPROLOL SUCCINATE ER 100 MG PO TB24
100.0000 mg | ORAL_TABLET | Freq: Every day | ORAL | Status: DC
  Administered 2012-02-26 – 2012-02-27 (×2): 100 mg via ORAL
  Filled 2012-02-26 (×2): qty 1

## 2012-02-26 MED ORDER — PANTOPRAZOLE SODIUM 40 MG PO TBEC
40.0000 mg | DELAYED_RELEASE_TABLET | Freq: Every day | ORAL | Status: DC
  Administered 2012-02-27: 40 mg via ORAL
  Filled 2012-02-26: qty 1

## 2012-02-26 MED ORDER — SODIUM CHLORIDE 0.9 % IJ SOLN
3.0000 mL | Freq: Two times a day (BID) | INTRAMUSCULAR | Status: DC
  Administered 2012-02-26: 3 mL via INTRAVENOUS

## 2012-02-26 MED ORDER — INSULIN ASPART 100 UNIT/ML ~~LOC~~ SOLN
0.0000 [IU] | Freq: Three times a day (TID) | SUBCUTANEOUS | Status: DC
  Administered 2012-02-26: 15 [IU] via SUBCUTANEOUS
  Administered 2012-02-27: 11 [IU] via SUBCUTANEOUS
  Administered 2012-02-27: 5 [IU] via SUBCUTANEOUS

## 2012-02-26 NOTE — ED Provider Notes (Signed)
History     CSN: [id number]  Arrival date & time 02/26/12  1342   First MD Initiated Contact with Patient 02/26/12 1428      Chief Complaint  Patient presents with  . Dizziness  . Nausea    (Consider location/radiation/quality/duration/timing/severity/associated sxs/prior treatment) The history is provided by the patient.  patient has had fatigue and some nausea since the last 2 weeks. She was seen her primary care doctor and lab work was done. She states that she was told to go to the ER because her hemoglobin was 7. She's a previous history of anemia and had to have transfusions couple years ago. Her baseline hemoglobin is 9 or 10. No fevers. No chest pain. No cough. No bleeding. No black or dark stools. She is not on blood thinners.  Past Medical History  Diagnosis Date  . Diabetic ketoacidosis 11/2010  . Myocardial infarction     Chest pain s/p normal cath in 08/2004 then NSTEMI during 11/2010 admission, negative Myoview  . Diabetes mellitus type 2, insulin dependent   . GERD (gastroesophageal reflux disease)   . Esophageal stricture     Dilation 06/2004  . Hypertension   . Supraventricular tachycardia   . history of  pericarditis 12/2002  . Pericardial effusion   . Rheumatoid arthritis     On MTX and chronic steroids  . Osteoarthritis   . Asthma     Home 3L O2   . Seizure 11/2008  . Rectal bleeding 11/2010    Large hemorrhoids  . Chronic anemia   . History of blood transfusion     Past Surgical History  Procedure Date  . Cholecystectomy   . Abdominal hysterectomy     History reviewed. No pertinent family history.  History  Substance Use Topics  . Smoking status: Never Smoker   . Smokeless tobacco: Never Used  . Alcohol Use: No    OB History    Grav Para Term Preterm Abortions TAB SAB Ect Mult Living                  Review of Systems  Constitutional: Positive for fatigue. Negative for activity change and appetite change.  HENT: Negative for neck  stiffness.   Eyes: Negative for pain.  Respiratory: Positive for shortness of breath. Negative for chest tightness.   Cardiovascular: Negative for chest pain and leg swelling.  Gastrointestinal: Negative for nausea, vomiting, abdominal pain and diarrhea.  Genitourinary: Negative for flank pain.  Musculoskeletal: Negative for back pain.  Skin: Negative for rash.  Neurological: Positive for light-headedness. Negative for weakness, numbness and headaches.  Psychiatric/Behavioral: Negative for behavioral problems.    Allergies  Demerol and Penicillins  Home Medications   Current Outpatient Rx  Name  Route  Sig  Dispense  Refill  . ALBUTEROL SULFATE (5 MG/ML) 0.5% IN NEBU   Nebulization   Take 0.5 mLs (2.5 mg total) by nebulization every 6 (six) hours as needed for wheezing or shortness of breath.   20 mL   0   . BUDESONIDE 180 MCG/ACT IN AEPB   Inhalation   Inhale 1 puff into the lungs.           . DOCUSATE SODIUM 100 MG PO CAPS   Oral   Take 1 capsule (100 mg total) by mouth daily as needed for constipation. Daily as needed   10 capsule   0   . ESOMEPRAZOLE MAGNESIUM 40 MG PO CPDR   Oral   Take 40 mg  by mouth daily before breakfast. Take 1 capsule twice daily.          Marland Kitchen ESTROGENS CONJUGATED 0.625 MG PO TABS   Oral   Take 0.625 mg by mouth daily. Take daily for 21 days then do not take for 7 days.          Arnette Schaumann PLUS PO CAPS   Oral   Take 1 capsule by mouth daily. Take 1 cap daily.         . FE FUMARATE-B12-VIT C-FA-IFC PO CAPS   Oral   Take 1 capsule by mouth daily before breakfast.           . FOLIC ACID 1 MG PO TABS   Oral   Take 1 mg by mouth daily.           Marland Kitchen GLUCOSAMINE-CHONDROITIN 500-400 MG PO TABS   Oral   Take 1 tablet by mouth 3 (three) times daily.         Marland Kitchen HYDROCODONE-ACETAMINOPHEN 5-500 MG PO TABS   Oral   Take 1 tablet by mouth every 6 (six) hours as needed. Pain         . HYDROCORTISONE 2.5 % EX CREA   Topical   Apply  topically 3 (three) times daily. Use rectally          . INSULIN DETEMIR 100 UNIT/ML [place] SOLN   Subcutaneous   Inject 20 Units into the skin daily.         Marland Kitchen LEFLUNOMIDE 20 MG PO TABS   Oral   Take 20 mg by mouth daily.         Marland Kitchen LORAZEPAM 1 MG PO TABS   Oral   Take 1 mg by mouth every 8 (eight) hours. 1 in the am and 2 in the pm         . METOPROLOL SUCCINATE ER 100 MG PO TB24   Oral   Take 100 mg by mouth daily.          . MULTIVITAMINS PO   Oral   Take 1 tablet by mouth. Daily.          Marland Kitchen POTASSIUM CHLORIDE CRYS ER 20 MEQ PO TBCR   Oral   Take 20 mEq by mouth. Take 1 tab twice daily.          Marland Kitchen PREDNISONE 10 MG PO TABS   Oral   Take 10 mg by mouth daily.         Marland Kitchen PREGABALIN 75 MG PO CAPS   Oral   Take 75 mg by mouth 3 (three) times daily. 1 IN THE AM AND 2 IN THE PM         . ROSUVASTATIN CALCIUM 10 MG PO TABS   Oral   Take 10 mg by mouth daily.             BP 182/79  Pulse 103  Temp 98.3 F (36.8 C) (Oral)  Resp 20  SpO2 98%  Physical Exam  Nursing note and vitals reviewed. Constitutional: She is oriented to person, place, and time. She appears well-developed and well-nourished.  HENT:  Head: Normocephalic and atraumatic.  Eyes: EOM are normal. Pupils are equal, round, and reactive to light.  Neck: Normal range of motion. Neck supple.  Cardiovascular: Normal rate, regular rhythm and normal heart sounds.   No murmur heard. Pulmonary/Chest: Effort normal and breath sounds normal. No respiratory distress. She has no wheezes. She has no rales.  Abdominal:  Soft. Bowel sounds are normal. She exhibits no distension. There is no tenderness. There is no rebound and no guarding.  Musculoskeletal: Normal range of motion.  Neurological: She is alert and oriented to person, place, and time. No cranial nerve deficit.  Skin: Skin is warm and dry.  Psychiatric: She has a normal mood and affect. Her speech is normal.    ED Course  Procedures  (including critical care time)  Labs Reviewed  CBC WITH DIFFERENTIAL - Abnormal; Notable for the following:    RBC 3.37 (*)     Hemoglobin 7.0 (*)     HCT 23.4 (*)     MCV 69.4 (*)     MCH 20.8 (*)     MCHC 29.9 (*)     RDW 16.5 (*)     Neutrophils Relative 91 (*)     Lymphocytes Relative 5 (*)     Neutro Abs 8.1 (*)     Lymphs Abs 0.5 (*)     All other components within normal limits  BASIC METABOLIC PANEL - Abnormal; Notable for the following:    Sodium 133 (*)     Glucose, Bld 342 (*)     Creatinine, Ser 1.24 (*)     Calcium 8.3 (*)     GFR calc non Af Amer 43 (*)     GFR calc Af Amer 50 (*)     All other components within normal limits  TROPONIN I  OCCULT BLOOD, POC DEVICE  SAMPLE TO BLOOD BANK   No results found.   1. Anemia      Date: 02/26/2012  Rate: 100  Rhythm: normal sinus rhythm  QRS Axis: normal  Intervals: normal  ST/T Wave abnormalities: nonspecific ST/T changes  Conduction Disutrbances:none  Narrative Interpretation: nonspecific ST changes  Old EKG Reviewed: changes noted    MDM  A patient with dizziness nausea fatigue. Found to be anemic at primary care doctors. Hemoglobin is 7 here. She is guaiac-negative. She'll be admitted to medicine.        Juliet Rude. Rubin Payor, MD 02/26/12 [phone]

## 2012-02-26 NOTE — ED Notes (Signed)
[id number]<AV> Expected date:<BR> Expected time:<BR> Means of arrival:<BR> Comments:<BR> Hold for [place]

## 2012-02-26 NOTE — ED Notes (Signed)
Pt presents to the ED with dizziness and nausea x2 weeks.  Reports " I couldn't walk from to kitchen without almost passing out."  Pt went to have blood work done by Dr and reports that HBG was "7 something".  Pt is alert and oriented and presents as NAD. Pt also reports that she has been feeling more fatigue than usual.  Pt has hx of blood transfusion x2 years.

## 2012-02-26 NOTE — H&P (Signed)
[facility] History and Physical  Marisa [name] ZOX:[id number] DOB: 04-20-1940 DOA: 02/26/2012  Referring physician: er PCP: Juline Patch, MD  Specialists:   Chief Complaint: fatigue  HPI: Marisa [name] is a 71 y.o. female  Who has a history of Rheumatoid Arthritis who has been complaining of SOB, fatigue for 2 weeks.   She states she has been having trouble getting around her house without "passing out".  She went to her PCP and had lab work done that showed a Hgb of 7.  She has in the past required transfusions.  Per old records her baseline Hgb is 9-10.      She  Has not had a  Change in her stools- no dark or tarry BMs.  No CP.  In the ER she was found to be hemmocult negative and Hbg of 7.  Last colonoscopy was about 5 years ago    Review of Systems: all systems reviewed, negative unless stated above  Past Medical History  Diagnosis Date  . Diabetic ketoacidosis 11/2010  . Myocardial infarction     Chest pain s/p normal cath in 08/2004 then NSTEMI during 11/2010 admission, negative Myoview  . Diabetes mellitus type 2, insulin dependent   . GERD (gastroesophageal reflux disease)   . Esophageal stricture     Dilation 06/2004  . Hypertension   . Supraventricular tachycardia   . history of  pericarditis 12/2002  . Pericardial effusion   . Rheumatoid arthritis     On MTX and chronic steroids  . Osteoarthritis   . Asthma     Home 3L O2   . Seizure 11/2008  . Rectal bleeding 11/2010    Large hemorrhoids  . Chronic anemia   . History of blood transfusion    Past Surgical History  Procedure Date  . Cholecystectomy   . Abdominal hysterectomy    Social History:  reports that she has never smoked. She has never used smokeless tobacco. She reports that she does not drink alcohol or use illicit drugs. From home  Allergies  Allergen Reactions  . Demerol (Meperidine)   . Penicillins Other (See Comments)    Throat swelling    History reviewed. No pertinent  family history- no history of cancer (GI)   Prior to Admission medications   Medication Sig Start Date End Date Taking? Authorizing Provider  albuterol (PROVENTIL) (5 MG/ML) 0.5% nebulizer solution Take 0.5 mLs (2.5 mg total) by nebulization every 6 (six) hours as needed for wheezing or shortness of breath. 10/08/11 10/07/12 Yes Nishant Dhungel, MD  budesonide (PULMICORT) 180 MCG/ACT inhaler Inhale 1 puff into the lungs.     Yes Historical Provider, MD  docusate sodium (COLACE) 100 MG capsule Take 1 capsule (100 mg total) by mouth daily as needed for constipation. Daily as needed 10/08/11  Yes Nishant Dhungel, MD  esomeprazole (NEXIUM) 40 MG capsule Take 40 mg by mouth daily before breakfast. Take 1 capsule twice daily.    Yes Historical Provider, MD  estrogens, conjugated, (PREMARIN) 0.625 MG tablet Take 0.625 mg by mouth daily. Take daily for 21 days then do not take for 7 days.    Yes Historical Provider, MD  FeFum-FePoly-FA-B Cmp-C-Biot (INTEGRA PLUS) CAPS Take 1 capsule by mouth daily. Take 1 cap daily.   Yes Historical Provider, MD  ferrous fumarate-b12-vitamic C-folic acid (TRINSICON / FOLTRIN) capsule Take 1 capsule by mouth daily before breakfast.     Yes Historical Provider, MD  folic acid (FOLVITE) 1 MG tablet Take 1 mg  by mouth daily.     Yes Historical Provider, MD  glucosamine-chondroitin 500-400 MG tablet Take 1 tablet by mouth 3 (three) times daily.   Yes Historical Provider, MD  HYDROcodone-acetaminophen (VICODIN) 5-500 MG per tablet Take 1 tablet by mouth every 6 (six) hours as needed. Pain   Yes Historical Provider, MD  hydrocortisone 2.5 % cream Apply topically 3 (three) times daily. Use rectally    Yes Historical Provider, MD  insulin detemir (LEVEMIR) 100 UNIT/ML injection Inject 20 Units into the skin daily. 10/08/11  Yes Nishant Dhungel, MD  leflunomide (ARAVA) 20 MG tablet Take 20 mg by mouth daily.   Yes Historical Provider, MD  LORazepam (ATIVAN) 1 MG tablet Take 1 mg by mouth  every 8 (eight) hours. 1 in the am and 2 in the pm   Yes Historical Provider, MD  metoprolol (TOPROL XL) 100 MG 24 hr tablet Take 100 mg by mouth daily.    Yes Historical Provider, MD  Multiple Vitamin (MULTIVITAMINS PO) Take 1 tablet by mouth. Daily.    Yes Historical Provider, MD  potassium chloride SA (K-DUR,KLOR-CON) 20 MEQ tablet Take 20 mEq by mouth. Take 1 tab twice daily.    Yes Historical Provider, MD  predniSONE (DELTASONE) 10 MG tablet Take 10 mg by mouth daily.   Yes Historical Provider, MD  pregabalin (LYRICA) 75 MG capsule Take 75 mg by mouth 3 (three) times daily. 1 IN THE AM AND 2 IN THE PM   Yes Historical Provider, MD  rosuvastatin (CRESTOR) 10 MG tablet Take 10 mg by mouth daily.     Yes Historical Provider, MD   Physical Exam: Filed Vitals:   02/26/12 1415  BP: 182/79  Pulse: 103  Temp: 98.3 F (36.8 C)  TempSrc: Oral  Resp: 20  SpO2: 98%     General:  A+Ox3, AD  Eyes: pale  ENT: dry  Neck: supple  Cardiovascular: mildly tachy but regular  Respiratory: clear anterior  Abdomen: +BS, soft,NT/ND  Skin: mild bruising,skin dressed on buttocks where wound was located  Musculoskeletal: moves all 4 extremitites  Psychiatric: no SI/no HI  Neurologic: CN 2-12 intact, no focal deficits  Labs on Admission:  Basic Metabolic Panel:  Lab 02/26/12 1610  NA 133*  K 4.7  CL 97  CO2 26  GLUCOSE 342*  BUN 21  CREATININE 1.24*  CALCIUM 8.3*  MG --  PHOS --   Liver Function Tests: No results found for this basename: AST:5,ALT:5,ALKPHOS:5,BILITOT:5,PROT:5,ALBUMIN:5 in the last 168 hours No results found for this basename: LIPASE:5,AMYLASE:5 in the last 168 hours No results found for this basename: AMMONIA:5 in the last 168 hours CBC:  Lab 02/26/12 1449  WBC 9.0  NEUTROABS 8.1*  HGB 7.0*  HCT 23.4*  MCV 69.4*  PLT 304   Cardiac Enzymes:  Lab 02/26/12 1449  CKTOTAL --  CKMB --  CKMBINDEX --  TROPONINI <0.30    BNP (last 3  results)  Basename 10/04/11 0959  PROBNP 2764.0*   CBG: No results found for this basename: GLUCAP:5 in the last 168 hours  Radiological Exams on Admission: No results found.    Assessment/Plan Active Problems:  Rheumatoid arthritis  Diabetes mellitus type 2, insulin dependent  Anemia  SOB (shortness of breath)  Hyponatremia  AKI (acute kidney injury)   1. Anemia of CD- most likely from RA- will check Fe panel, B12, TSH, MCV low, stool negative for Hgb- transfuse 2 units- not likely GI bleed as BUN not elevated 2. SOB/weakness- from  low Hgb 3. AKI- transfuse 2 units and monitor in AM 4. Hyponatremia- IVF and monitor 5. RA- continue home medications- well controlled 6. DM- SSI, continue home medicatios    Code Status: full Family Communication: husband at bedside Disposition Plan: home- hope for tomm  Time spent: 35 min  Benjamine Mola Marisa [name] [facility] Pager [phone]  If 7PM-7AM, please contact night-coverage [email or web address] Password [email or web address] 02/26/2012, 4:16 PM

## 2012-02-26 NOTE — ED Notes (Signed)
Pt.'s hemoccult card at bedside. MD Pickering aware.

## 2012-02-27 DIAGNOSIS — D509 Iron deficiency anemia, unspecified: Secondary | ICD-10-CM

## 2012-02-27 DIAGNOSIS — R0602 Shortness of breath: Secondary | ICD-10-CM

## 2012-02-27 DIAGNOSIS — N179 Acute kidney failure, unspecified: Secondary | ICD-10-CM | POA: Diagnosis not present

## 2012-02-27 DIAGNOSIS — E119 Type 2 diabetes mellitus without complications: Secondary | ICD-10-CM | POA: Diagnosis not present

## 2012-02-27 DIAGNOSIS — D649 Anemia, unspecified: Secondary | ICD-10-CM | POA: Diagnosis not present

## 2012-02-27 DIAGNOSIS — Z794 Long term (current) use of insulin: Secondary | ICD-10-CM | POA: Diagnosis not present

## 2012-02-27 LAB — BASIC METABOLIC PANEL
Calcium: 8.1 mg/dL — ABNORMAL LOW (ref 8.4–10.5)
GFR calc non Af Amer: 71 mL/min — ABNORMAL LOW (ref >90)
Glucose, Bld: 339 mg/dL — ABNORMAL HIGH (ref 70–99)
Sodium: 134 mEq/L — ABNORMAL LOW (ref 135–145)

## 2012-02-27 LAB — IRON AND TIBC: UIBC: 332 ug/dL (ref 125–400)

## 2012-02-27 LAB — HEMOGLOBIN AND HEMATOCRIT, BLOOD: HCT: 29.7 % — ABNORMAL LOW (ref 36.0–46.0)

## 2012-02-27 LAB — FERRITIN: Ferritin: 12 ng/mL (ref 10–291)

## 2012-02-27 MED ORDER — INTEGRA PLUS PO CAPS: 1.0000 | ORAL_CAPSULE | Freq: Every day | ORAL | Status: DC

## 2012-02-27 MED ORDER — SODIUM CHLORIDE 0.9 % IV SOLN
500.0000 mg | Freq: Once | INTRAVENOUS | Status: AC
  Administered 2012-02-27: 500 mg via INTRAVENOUS
  Filled 2012-02-27: qty 10

## 2012-02-27 MED ORDER — HYDRALAZINE HCL 20 MG/ML IJ SOLN
INTRAMUSCULAR | Status: AC
  Administered 2012-02-27: 5 mg via INTRAVENOUS
  Filled 2012-02-27: qty 1

## 2012-02-27 MED ORDER — SODIUM CHLORIDE 0.9 % IV SOLN
25.0000 mg | Freq: Once | INTRAVENOUS | Status: AC
  Administered 2012-02-27: 25 mg via INTRAVENOUS
  Filled 2012-02-27: qty 0.5

## 2012-02-27 MED ORDER — INSULIN DETEMIR 100 UNIT/ML ~~LOC~~ SOLN: 20.0000 [IU] | Freq: Every day | SUBCUTANEOUS | Status: DC

## 2012-02-27 MED ORDER — PREDNISONE 5 MG PO TABS
9.0000 mg | ORAL_TABLET | Freq: Every day | ORAL | Status: DC
  Administered 2012-02-27: 9 mg via ORAL
  Filled 2012-02-27: qty 4

## 2012-02-27 MED ORDER — HYDRALAZINE HCL 20 MG/ML IJ SOLN
5.0000 mg | Freq: Once | INTRAMUSCULAR | Status: AC
  Administered 2012-02-27: 5 mg via INTRAVENOUS

## 2012-02-27 MED ORDER — HYDRALAZINE HCL 20 MG/ML IJ SOLN
10.0000 mg | Freq: Once | INTRAMUSCULAR | Status: AC
  Administered 2012-02-27: 10 mg via INTRAVENOUS
  Filled 2012-02-27: qty 1

## 2012-02-27 NOTE — Progress Notes (Signed)
Patient discharged home in stable condition.  Discharge instructions given to patient and husband with verbal understanding and feedback.  All questions answered at this time.  MyChart set up completed before discharge.

## 2012-02-27 NOTE — Discharge Summary (Signed)
Physician Discharge Summary  Marisa [name] ZOX:[id number] DOB: Mar 05, 1941 DOA: 02/26/2012  PCP: Juline Patch, MD  Admit date: 02/26/2012 Discharge date: 02/27/2012  Time spent: 30 minutes  Recommendations for Outpatient Follow-up:  Follow up with PCP. Follow with GI.  Discharge Diagnoses:  Principal Problem:  *SOB (shortness of breath) Active Problems:  Iron deficiency  AKI (acute kidney injury)  Rheumatoid arthritis  Diabetes mellitus type 2, insulin dependent  Hyponatremia   Discharge Condition: stable  Diet recommendation: regular  Filed Weights   02/26/12 1901  Weight: 56.7 kg (125 lb)    History of present illness:  Who has a history of Rheumatoid Arthritis who has been complaining of SOB, fatigue for 2 weeks. She states she has been having trouble getting around her house without "passing out". She went to her PCP and had lab work done that showed a Hgb of 7. She has in the past required transfusions. Per old records her baseline Hgb is 9-10.  She Has not had a Change in her stools- no dark or tarry BMs. No CP.  In the ER she was found to be hemmocult negative and Hbg of 7.  Last colonoscopy was about 5 years ago   Hospital Course:  SOB (shortness of breath) (02/26/2012) - likely 2/2 Anemia, now resolved.   Iron deficiency Anemia (02/26/2012) - FOBT negative. Ferritin 12. Transfused IV iron.  - follow up with [facility] as an outpatient. - she relates she has hemmoroids which bleed intermittently once a week.  - Last colonoscopy was about 5 years ago.  AKI (acute kidney injury) (02/26/2012) -resolved.  Rheumatoid arthritis (06/08/2007) -continue home medications. Prednisone no NSAID's   Diabetes mellitus type 2, insulin dependent () - Increase levemir.  Hyponatremia (02/26/2012) -pseudohyponatremia 2/2 elevated BG.   Procedures:  none (i.e. Studies not automatically included, echos, thoracentesis, etc; not  x-rays)  Consultations:  none  Discharge Exam: Filed Vitals:   02/27/12 0350 02/27/12 0450 02/27/12 0515 02/27/12 0639  BP: 147/69 160/72 189/94 122/66  Pulse: 80 105 110 111  Temp: 97.7 F (36.5 C) 98.2 F (36.8 C) 98.8 F (37.1 C) 98.7 F (37.1 C)  TempSrc: Oral Oral Oral Oral  Resp: 16 16 18 16   Height:      Weight:      SpO2:    100%    General: A&Ox3 Cardiovascular: RRR Respiratory: Good air movement CTA B/L  Discharge Instructions  Discharge Orders    Future Orders Please Complete By Expires   Diet - low sodium heart healthy      Increase activity slowly          Medication List     As of 02/27/2012  8:22 AM    STOP taking these medications         ferrous fumarate-b12-vitamic C-folic acid capsule   Commonly known as: TRINSICON / FOLTRIN      TAKE these medications         albuterol (5 MG/ML) 0.5% nebulizer solution   Commonly known as: PROVENTIL   Take 0.5 mLs (2.5 mg total) by nebulization every 6 (six) hours as needed for wheezing or shortness of breath.      budesonide 180 MCG/ACT inhaler   Commonly known as: PULMICORT   Inhale 1 puff into the lungs.      docusate sodium 100 MG capsule   Commonly known as: COLACE   Take 1 capsule (100 mg total) by mouth daily as needed for constipation. Daily as needed  esomeprazole 40 MG capsule   Commonly known as: NEXIUM   Take 40 mg by mouth daily before breakfast. Take 1 capsule twice daily.      estrogens (conjugated) 0.625 MG tablet   Commonly known as: PREMARIN   Take 0.625 mg by mouth daily. Take daily for 21 days then do not take for 7 days.      folic acid 1 MG tablet   Commonly known as: FOLVITE   Take 1 mg by mouth daily.      glucosamine-chondroitin 500-400 MG tablet   Take 1 tablet by mouth 3 (three) times daily.      HYDROcodone-acetaminophen 5-500 MG per tablet   Commonly known as: VICODIN   Take 1 tablet by mouth every 6 (six) hours as needed. Pain      hydrocortisone 2.5 %  cream   Apply topically 3 (three) times daily. Use rectally      insulin detemir 100 UNIT/ML injection   Commonly known as: LEVEMIR   Inject 20 Units into the skin daily.      INTEGRA PLUS Caps   Take 1 capsule by mouth daily. Take 1 cap daily.      leflunomide 20 MG tablet   Commonly known as: ARAVA   Take 20 mg by mouth daily.      LORazepam 1 MG tablet   Commonly known as: ATIVAN   Take 1 mg by mouth every 8 (eight) hours. 1 in the am and 2 in the pm      LYRICA 75 MG capsule   Generic drug: pregabalin   Take 75 mg by mouth 3 (three) times daily. 1 IN THE AM AND 2 IN THE PM      MULTIVITAMINS PO   Take 1 tablet by mouth. Daily.      potassium chloride SA 20 MEQ tablet   Commonly known as: K-DUR,KLOR-CON   Take 20 mEq by mouth. Take 1 tab twice daily.      predniSONE 10 MG tablet   Commonly known as: DELTASONE   Take 10 mg by mouth daily.      rosuvastatin 10 MG tablet   Commonly known as: CRESTOR   Take 10 mg by mouth daily.      TOPROL XL 100 MG 24 hr tablet   Generic drug: metoprolol succinate   Take 100 mg by mouth daily.           Follow-up Information    Follow up with Juline Patch, MD.   Contact information:   [address], [address] [place] [address] [phone]       Follow up with Judie Petit T. Russella Dar, MD,FACG. In 2 weeks. (hospital follow up for iron deficiency anemia)    Contact information:   [address] [place] [address] [phone]           The results of significant diagnostics from this hospitalization (including imaging, microbiology, ancillary and laboratory) are listed below for reference.    Significant Diagnostic Studies: No results found.  Microbiology: Recent Results (from the past 240 hour(s))  MRSA PCR SCREENING     Status: Normal   Collection Time   02/26/12  6:43 PM      Component Value Range Status Comment   MRSA by PCR NEGATIVE  NEGATIVE Final      Labs: Basic  Metabolic Panel:  Lab 02/26/12 4010  NA 133*  K 4.7  CL 97  CO2 26  GLUCOSE 342*  BUN 21  CREATININE 1.24*  CALCIUM 8.3*  MG --  PHOS --   Liver Function Tests: No results found for this basename: AST:5,ALT:5,ALKPHOS:5,BILITOT:5,PROT:5,ALBUMIN:5 in the last 168 hours No results found for this basename: LIPASE:5,AMYLASE:5 in the last 168 hours No results found for this basename: AMMONIA:5 in the last 168 hours CBC:  Lab 02/27/12 0720 02/26/12 1449  WBC -- 9.0  NEUTROABS -- 8.1*  HGB 9.4* 7.0*  HCT 29.7* 23.4*  MCV -- 69.4*  PLT -- 304   Cardiac Enzymes:  Lab 02/26/12 1449  CKTOTAL --  CKMB --  CKMBINDEX --  TROPONINI <0.30   BNP: BNP (last 3 results)  Basename 10/04/11 0959  PROBNP 2764.0*   CBG:  Lab 02/26/12 2201 02/26/12 1753 02/26/12 1430  GLUCAP 142* 359* 321*       Signed:  Marinda Elk  [facility] 02/27/2012, 8:22 AM

## 2012-02-27 NOTE — Progress Notes (Signed)
Provided support with pt and husband (Ray) prior to discharge.  Pt and husband spoke about hopes they had for retirement years and how health issues have not allowed them to engage in activities they had hoped.

## 2012-02-27 NOTE — Progress Notes (Addendum)
[facility] PROGRESS NOTE  Assessment/Plan: SOB (shortness of breath) (02/26/2012) - likely 2/2 Anemia, now resolved.  Iron deficiency Anemia (02/26/2012) - FOBT negative. Ferritin 12. Transfused IV iron.  - follow up with GI. - she relates she has hemmoroids which bleed intermittently once a week.  - Last colonoscopy was about 5 years ago. Follow up with GI as an outpatient.  AKI (acute kidney injury) (02/26/2012) -basic metabolic panel pending  Rheumatoid arthritis (06/08/2007) -continue home medications. Prednisone no NSAID's  Diabetes mellitus type 2, insulin dependent () -god controlled continue Levemir and SSI.  Hyponatremia (02/26/2012)  -pseudohyponatremia 2/2 elevated BG.  Code Status: full  Family Communication: husband at bedside  Disposition Plan: home- hope for tomm    Consultants:  none  Procedures:  none  Antibiotics:  none (indicate start date, and stop date if known)  HPI/Subjective: No complains she relates hse feels with more energy.  Objective: Filed Vitals:   02/27/12 0350 02/27/12 0450 02/27/12 0515 02/27/12 0639  BP: 147/69 160/72 189/94 122/66  Pulse: 80 105 110 111  Temp: 97.7 F (36.5 C) 98.2 F (36.8 C) 98.8 F (37.1 C) 98.7 F (37.1 C)  TempSrc: Oral Oral Oral Oral  Resp: 16 16 18 16   Height:      Weight:      SpO2:    100%    Intake/Output Summary (Last 24 hours) at 02/27/12 0801 Last data filed at 02/27/12 0231  Gross per 24 hour  Intake    617 ml  Output      0 ml  Net    617 ml   Filed Weights   02/26/12 1901  Weight: 56.7 kg (125 lb)    Exam:  General: Alert, awake, oriented x3, in no acute distress.  HEENT: No bruits, no goiter.  Heart: Regular rate and rhythm, without murmurs, rubs, gallops.  Lungs: Good air movement, clear to auscultation Abdomen: Soft, nontender, nondistended, positive bowel sounds.  Neuro: Grossly intact, nonfocal. Ext: urlnar deviation of wrist.   Data Reviewed: Basic  Metabolic Panel:  Lab 02/26/12 1610  NA 133*  K 4.7  CL 97  CO2 26  GLUCOSE 342*  BUN 21  CREATININE 1.24*  CALCIUM 8.3*  MG --  PHOS --   Liver Function Tests: No results found for this basename: AST:5,ALT:5,ALKPHOS:5,BILITOT:5,PROT:5,ALBUMIN:5 in the last 168 hours No results found for this basename: LIPASE:5,AMYLASE:5 in the last 168 hours No results found for this basename: AMMONIA:5 in the last 168 hours CBC:  Lab 02/27/12 0720 02/26/12 1449  WBC -- 9.0  NEUTROABS -- 8.1*  HGB 9.4* 7.0*  HCT 29.7* 23.4*  MCV -- 69.4*  PLT -- 304   Cardiac Enzymes:  Lab 02/26/12 1449  CKTOTAL --  CKMB --  CKMBINDEX --  TROPONINI <0.30   BNP (last 3 results)  Basename 10/04/11 0959  PROBNP 2764.0*   CBG:  Lab 02/26/12 2201 02/26/12 1753 02/26/12 1430  GLUCAP 142* 359* 321*    Recent Results (from the past 240 hour(s))  MRSA PCR SCREENING     Status: Normal   Collection Time   02/26/12  6:43 PM      Component Value Range Status Comment   MRSA by PCR NEGATIVE  NEGATIVE Final      Studies: No results found.  Scheduled Meds:    . atorvastatin  20 mg Oral q1800  . estrogens (conjugated)  0.625 mg Oral Daily  . fluticasone  2 puff Inhalation BID  . folic acid  1 mg Oral Daily  . [COMPLETED] hydrALAZINE  10 mg Intravenous Once  . [COMPLETED] hydrALAZINE  10 mg Intravenous Once  . [COMPLETED] hydrALAZINE  5 mg Intravenous Once  . hydrocortisone   Topical TID  . influenza  inactive virus vaccine  0.5 mL Intramuscular Tomorrow-1000  . insulin aspart  0-15 Units Subcutaneous TID WC  . insulin aspart  0-5 Units Subcutaneous QHS  . insulin detemir  20 Units Subcutaneous Daily  . iron dextran (INFED/DEXFERRUM) infusion  25 mg Intravenous Once   Followed by  . iron dextran (INFED/DEXFERRUM) infusion  500 mg Intravenous Once  . leflunomide  20 mg Oral Daily  . LORazepam  1 mg Oral Q8H  . metoprolol succinate  100 mg Oral Daily  . pantoprazole  40 mg Oral Daily  .  predniSONE  10 mg Oral Daily  . pregabalin  75 mg Oral TID  . sodium chloride  3 mL Intravenous Q12H  . sodium chloride  3 mL Intravenous Q12H   Continuous Infusions:    . sodium chloride 125 mL/hr at 02/27/12 0605     Marinda Elk  [facility] Pager [phone].  If 8PM-8AM, please contact night-coverage at [email or web address], password [facility] 02/27/2012, 8:01 AM  LOS: 1 day

## 2012-02-28 LAB — TYPE AND SCREEN
ABO/RH(D): O POS
Antibody Screen: NEGATIVE
Unit division: 0

## 2012-02-28 LAB — GLUCOSE, CAPILLARY: Glucose-Capillary: 219 mg/dL — ABNORMAL HIGH (ref 70–99)

## 2012-03-19 DIAGNOSIS — M545 Low back pain: Secondary | ICD-10-CM | POA: Diagnosis not present

## 2012-03-19 DIAGNOSIS — M069 Rheumatoid arthritis, unspecified: Secondary | ICD-10-CM | POA: Diagnosis not present

## 2012-03-19 DIAGNOSIS — D649 Anemia, unspecified: Secondary | ICD-10-CM | POA: Diagnosis not present

## 2012-03-26 DIAGNOSIS — D649 Anemia, unspecified: Secondary | ICD-10-CM | POA: Diagnosis not present

## 2012-03-26 DIAGNOSIS — I10 Essential (primary) hypertension: Secondary | ICD-10-CM | POA: Diagnosis not present

## 2012-03-26 DIAGNOSIS — E119 Type 2 diabetes mellitus without complications: Secondary | ICD-10-CM | POA: Diagnosis not present

## 2012-03-26 DIAGNOSIS — G4700 Insomnia, unspecified: Secondary | ICD-10-CM | POA: Diagnosis not present

## 2012-03-26 DIAGNOSIS — J45909 Unspecified asthma, uncomplicated: Secondary | ICD-10-CM | POA: Diagnosis not present

## 2012-04-13 DIAGNOSIS — J449 Chronic obstructive pulmonary disease, unspecified: Secondary | ICD-10-CM | POA: Diagnosis not present

## 2012-05-07 DIAGNOSIS — E1129 Type 2 diabetes mellitus with other diabetic kidney complication: Secondary | ICD-10-CM | POA: Diagnosis not present

## 2012-05-07 DIAGNOSIS — M899 Disorder of bone, unspecified: Secondary | ICD-10-CM | POA: Diagnosis not present

## 2012-05-07 DIAGNOSIS — M949 Disorder of cartilage, unspecified: Secondary | ICD-10-CM | POA: Diagnosis not present

## 2012-05-23 DIAGNOSIS — J45909 Unspecified asthma, uncomplicated: Secondary | ICD-10-CM | POA: Diagnosis not present

## 2012-05-23 DIAGNOSIS — J449 Chronic obstructive pulmonary disease, unspecified: Secondary | ICD-10-CM | POA: Diagnosis not present

## 2012-06-06 ENCOUNTER — Emergency Department (HOSPITAL_COMMUNITY): Payer: Medicare Other

## 2012-06-06 ENCOUNTER — Emergency Department (HOSPITAL_COMMUNITY)
Admission: EM | Admit: 2012-06-06 | Discharge: 2012-06-06 | Disposition: A | Discharge status: Home / Self Care | Payer: Medicare Other | Attending: Emergency Medicine | Admitting: Emergency Medicine

## 2012-06-06 ENCOUNTER — Encounter (HOSPITAL_COMMUNITY): Payer: Self-pay | Admitting: *Deleted

## 2012-06-06 DIAGNOSIS — R0602 Shortness of breath: Secondary | ICD-10-CM | POA: Diagnosis not present

## 2012-06-06 DIAGNOSIS — R5381 Other malaise: Secondary | ICD-10-CM | POA: Diagnosis not present

## 2012-06-06 DIAGNOSIS — Z79899 Other long term (current) drug therapy: Secondary | ICD-10-CM | POA: Insufficient documentation

## 2012-06-06 DIAGNOSIS — Z8679 Personal history of other diseases of the circulatory system: Secondary | ICD-10-CM | POA: Insufficient documentation

## 2012-06-06 DIAGNOSIS — R112 Nausea with vomiting, unspecified: Secondary | ICD-10-CM | POA: Insufficient documentation

## 2012-06-06 DIAGNOSIS — K921 Melena: Secondary | ICD-10-CM | POA: Insufficient documentation

## 2012-06-06 DIAGNOSIS — R109 Unspecified abdominal pain: Secondary | ICD-10-CM | POA: Diagnosis not present

## 2012-06-06 DIAGNOSIS — E1310 Other specified diabetes mellitus with ketoacidosis without coma: Secondary | ICD-10-CM | POA: Insufficient documentation

## 2012-06-06 DIAGNOSIS — D638 Anemia in other chronic diseases classified elsewhere: Secondary | ICD-10-CM | POA: Diagnosis not present

## 2012-06-06 DIAGNOSIS — I10 Essential (primary) hypertension: Secondary | ICD-10-CM | POA: Diagnosis not present

## 2012-06-06 DIAGNOSIS — K625 Hemorrhage of anus and rectum: Secondary | ICD-10-CM | POA: Insufficient documentation

## 2012-06-06 DIAGNOSIS — Z794 Long term (current) use of insulin: Secondary | ICD-10-CM | POA: Diagnosis not present

## 2012-06-06 DIAGNOSIS — K219 Gastro-esophageal reflux disease without esophagitis: Secondary | ICD-10-CM | POA: Diagnosis not present

## 2012-06-06 DIAGNOSIS — Z8669 Personal history of other diseases of the nervous system and sense organs: Secondary | ICD-10-CM | POA: Insufficient documentation

## 2012-06-06 DIAGNOSIS — J45909 Unspecified asthma, uncomplicated: Secondary | ICD-10-CM | POA: Diagnosis not present

## 2012-06-06 DIAGNOSIS — I498 Other specified cardiac arrhythmias: Secondary | ICD-10-CM | POA: Diagnosis not present

## 2012-06-06 DIAGNOSIS — Z8719 Personal history of other diseases of the digestive system: Secondary | ICD-10-CM | POA: Insufficient documentation

## 2012-06-06 DIAGNOSIS — E860 Dehydration: Secondary | ICD-10-CM | POA: Insufficient documentation

## 2012-06-06 DIAGNOSIS — Z8739 Personal history of other diseases of the musculoskeletal system and connective tissue: Secondary | ICD-10-CM | POA: Diagnosis not present

## 2012-06-06 DIAGNOSIS — K922 Gastrointestinal hemorrhage, unspecified: Secondary | ICD-10-CM | POA: Diagnosis not present

## 2012-06-06 DIAGNOSIS — I252 Old myocardial infarction: Secondary | ICD-10-CM | POA: Diagnosis not present

## 2012-06-06 DIAGNOSIS — R197 Diarrhea, unspecified: Secondary | ICD-10-CM | POA: Diagnosis not present

## 2012-06-06 LAB — COMPREHENSIVE METABOLIC PANEL
BUN: 9 mg/dL (ref 6–23)
Calcium: 8.3 mg/dL — ABNORMAL LOW (ref 8.4–10.5)
Creatinine, Ser: 0.88 mg/dL (ref 0.50–1.10)
GFR calc Af Amer: 75 mL/min — ABNORMAL LOW (ref >90)
GFR calc non Af Amer: 65 mL/min — ABNORMAL LOW (ref >90)
Glucose, Bld: 255 mg/dL — ABNORMAL HIGH (ref 70–99)
Sodium: 129 mEq/L — ABNORMAL LOW (ref 135–145)
Total Protein: 5.4 g/dL — ABNORMAL LOW (ref 6.0–8.3)

## 2012-06-06 LAB — PRO B NATRIURETIC PEPTIDE: Pro B Natriuretic peptide (BNP): 405.9 pg/mL — ABNORMAL HIGH (ref 0–125)

## 2012-06-06 LAB — CBC WITH DIFFERENTIAL/PLATELET
Eosinophils Absolute: 0.4 10*3/uL (ref 0.0–0.7)
Eosinophils Relative: 6 % — ABNORMAL HIGH (ref 0–5)
HCT: 34 % — ABNORMAL LOW (ref 36.0–46.0)
Lymphs Abs: 0.7 10*3/uL (ref 0.7–4.0)
MCH: 29.1 pg (ref 26.0–34.0)
MCV: 88.3 fL (ref 78.0–100.0)
Monocytes Absolute: 1.3 10*3/uL — ABNORMAL HIGH (ref 0.1–1.0)
Platelets: 253 10*3/uL (ref 150–400)
RBC: 3.85 MIL/uL — ABNORMAL LOW (ref 3.87–5.11)

## 2012-06-06 LAB — URINALYSIS, ROUTINE W REFLEX MICROSCOPIC
Ketones, ur: >80 mg/dL — AB
Leukocytes, UA: NEGATIVE
Protein, ur: NEGATIVE mg/dL
Urobilinogen, UA: 0.2 mg/dL (ref 0.0–1.0)

## 2012-06-06 LAB — LACTIC ACID, PLASMA: Lactic Acid, Venous: 1.3 mmol/L (ref 0.5–2.2)

## 2012-06-06 LAB — GLUCOSE, CAPILLARY: Glucose-Capillary: 243 mg/dL — ABNORMAL HIGH (ref 70–99)

## 2012-06-06 LAB — TROPONIN I: Troponin I: <0.3 ng/mL (ref <0.30)

## 2012-06-06 MED ORDER — MORPHINE SULFATE 2 MG/ML IJ SOLN
2.0000 mg | Freq: Once | INTRAMUSCULAR | Status: AC
  Administered 2012-06-06: 2 mg via INTRAVENOUS
  Filled 2012-06-06: qty 1

## 2012-06-06 MED ORDER — LABETALOL HCL 5 MG/ML IV SOLN
20.0000 mg | Freq: Once | INTRAVENOUS | Status: AC
  Administered 2012-06-06: 20 mg via INTRAVENOUS
  Filled 2012-06-06: qty 4

## 2012-06-06 MED ORDER — IOHEXOL 300 MG/ML  SOLN
50.0000 mL | Freq: Once | INTRAMUSCULAR | Status: AC | PRN
  Administered 2012-06-06: 50 mL via ORAL

## 2012-06-06 MED ORDER — SODIUM CHLORIDE 0.9 % IV BOLUS (SEPSIS)
500.0000 mL | Freq: Once | INTRAVENOUS | Status: AC
  Administered 2012-06-06: 500 mL via INTRAVENOUS

## 2012-06-06 MED ORDER — IOHEXOL 300 MG/ML  SOLN
100.0000 mL | Freq: Once | INTRAMUSCULAR | Status: AC | PRN
  Administered 2012-06-06: 100 mL via INTRAVENOUS

## 2012-06-06 MED ORDER — ONDANSETRON HCL 4 MG PO TABS: 4.0000 mg | ORAL_TABLET | Freq: Four times a day (QID) | ORAL | Status: DC

## 2012-06-06 MED ORDER — SODIUM CHLORIDE 0.9 % IV SOLN
INTRAVENOUS | Status: DC
  Administered 2012-06-06: 10:00:00 via INTRAVENOUS

## 2012-06-06 MED ORDER — ONDANSETRON HCL 4 MG/2ML IJ SOLN
4.0000 mg | Freq: Once | INTRAMUSCULAR | Status: AC
  Administered 2012-06-06: 4 mg via INTRAVENOUS
  Filled 2012-06-06: qty 2

## 2012-06-06 NOTE — ED Notes (Signed)
Pt escorted to discharge window. Pt verbalized understanding discharge instructions. In no acute distress.  

## 2012-06-06 NOTE — ED Notes (Signed)
ZOX:[id number]<AV> Expected date:06/06/12<BR> Expected time: 8:50 AM<BR> Means of arrival:Ambulance<BR> Comments:<BR> abd pain, HR 140, 02 dependent at home

## 2012-06-06 NOTE — ED Notes (Signed)
Pt to CT

## 2012-06-06 NOTE — ED Provider Notes (Signed)
History     CSN: [id number]  Arrival date & time 06/06/12  7829   First MD Initiated Contact with Patient 06/06/12 [phone]      Chief Complaint  Patient presents with  . Abdominal Pain  . Rectal Bleeding    (Consider location/radiation/quality/duration/timing/severity/associated sxs/prior treatment) HPI Comments: Patient comes to the ER for evaluation of abdominal pain. Patient reports that she has been having pain in her abdomen for the last 4 days. Symptoms have progressively worsened. She has not been able to eat anything, has been trying to drink water to stay hydrated. She has had nausea and vomiting. There has also been loose stools with movement blood, starting last night. Patient's pain is moderate to severe and continuous. She indicates that it is mostly left lower quadrant with some in the right upper abdomen.  Patient is a 72 y.o. female presenting with abdominal pain and hematochezia.  Abdominal Pain Associated symptoms: diarrhea, hematochezia, nausea, shortness of breath and vomiting   Rectal Bleeding  Associated symptoms include abdominal pain, diarrhea, nausea and vomiting.    Past Medical History  Diagnosis Date  . Diabetic ketoacidosis 11/2010  . Myocardial infarction     Chest pain s/p normal cath in 08/2004 then NSTEMI during 11/2010 admission, negative Myoview  . Diabetes mellitus type 2, insulin dependent   . GERD (gastroesophageal reflux disease)   . Esophageal stricture     Dilation 06/2004  . Hypertension   . Supraventricular tachycardia   . history of  pericarditis 12/2002  . Pericardial effusion   . Rheumatoid arthritis     On MTX and chronic steroids  . Osteoarthritis   . Asthma     Home 3L O2   . Seizure 11/2008  . Rectal bleeding 11/2010    Large hemorrhoids  . Chronic anemia   . History of blood transfusion     Past Surgical History  Procedure Laterality Date  . Cholecystectomy    . Abdominal hysterectomy      History reviewed. No pertinent  family history.  History  Substance Use Topics  . Smoking status: Never Smoker   . Smokeless tobacco: Never Used  . Alcohol Use: No    OB History   Grav Para Term Preterm Abortions TAB SAB Ect Mult Living                  Review of Systems  Respiratory: Positive for shortness of breath.   Gastrointestinal: Positive for nausea, vomiting, abdominal pain, diarrhea, hematochezia and anal bleeding.  Musculoskeletal: Positive for arthralgias.  Neurological: Positive for weakness.  All other systems reviewed and are negative.    Allergies  Demerol and Penicillins  Home Medications   Current Outpatient Rx  Name  Route  Sig  Dispense  Refill  . albuterol (PROVENTIL) (5 MG/ML) 0.5% nebulizer solution   Nebulization   Take 0.5 mLs (2.5 mg total) by nebulization every 6 (six) hours as needed for wheezing or shortness of breath.   20 mL   0   . budesonide (PULMICORT) 180 MCG/ACT inhaler   Inhalation   Inhale 1 puff into the lungs.           . docusate sodium (COLACE) 100 MG capsule   Oral   Take 1 capsule (100 mg total) by mouth daily as needed for constipation. Daily as needed   10 capsule   0   . esomeprazole (NEXIUM) 40 MG capsule   Oral   Take 40 mg by mouth daily  before breakfast. Take 1 capsule twice daily.          Marland Kitchen estrogens, conjugated, (PREMARIN) 0.625 MG tablet   Oral   Take 0.625 mg by mouth daily. Take daily for 21 days then do not take for 7 days.          . FeFum-FePoly-FA-B Cmp-C-Biot (INTEGRA PLUS) CAPS   Oral   Take 1 capsule by mouth daily. Take 1 cap daily.   30 capsule   4   . folic acid (FOLVITE) 1 MG tablet   Oral   Take 1 mg by mouth daily.           Marland Kitchen glucosamine-chondroitin 500-400 MG tablet   Oral   Take 1 tablet by mouth 3 (three) times daily.         Marland Kitchen HYDROcodone-acetaminophen (VICODIN) 5-500 MG per tablet   Oral   Take 1 tablet by mouth every 6 (six) hours as needed. Pain         . hydrocortisone 2.5 % cream    Topical   Apply topically 3 (three) times daily. Use rectally          . insulin detemir (LEVEMIR) 100 UNIT/ML injection   Subcutaneous   Inject 20 Units into the skin daily.   10 mL      . leflunomide (ARAVA) 20 MG tablet   Oral   Take 20 mg by mouth daily.         Marland Kitchen LORazepam (ATIVAN) 1 MG tablet   Oral   Take 1 mg by mouth every 8 (eight) hours. 1 in the am and 2 in the pm         . metoprolol (TOPROL XL) 100 MG 24 hr tablet   Oral   Take 100 mg by mouth daily.          . Multiple Vitamin (MULTIVITAMINS PO)   Oral   Take 1 tablet by mouth. Daily.          . potassium chloride SA (K-DUR,KLOR-CON) 20 MEQ tablet   Oral   Take 20 mEq by mouth. Take 1 tab twice daily.          . predniSONE (DELTASONE) 10 MG tablet   Oral   Take 10 mg by mouth daily.         . pregabalin (LYRICA) 75 MG capsule   Oral   Take 75 mg by mouth 3 (three) times daily. 1 IN THE AM AND 2 IN THE PM         . rosuvastatin (CRESTOR) 10 MG tablet   Oral   Take 10 mg by mouth daily.             SpO2 92%  Physical Exam  Constitutional: She is oriented to person, place, and time. She appears well-developed and well-nourished. She appears distressed.  HENT:  Head: Normocephalic and atraumatic.  Right Ear: Hearing normal.  Nose: Nose normal.  Mouth/Throat: Oropharynx is clear and moist. Mucous membranes are dry.  Eyes: Conjunctivae and EOM are normal. Pupils are equal, round, and reactive to light.  Neck: Normal range of motion. Neck supple.  Cardiovascular: Regular rhythm, S1 normal and S2 normal.  Tachycardia present.  Exam reveals no gallop and no friction rub.   No murmur heard. Pulmonary/Chest: Effort normal and breath sounds normal. No respiratory distress. She exhibits no tenderness.  Abdominal: Soft. Normal appearance and bowel sounds are normal. There is no hepatosplenomegaly. There is tenderness in the right  lower quadrant, suprapubic area and left lower quadrant. There  is guarding. There is no rebound, no tenderness at McBurney's point and negative Murphy's sign. No hernia.  Genitourinary: Rectal exam shows external hemorrhoid. Rectal exam shows no mass. Guaiac positive stool.  Musculoskeletal: Normal range of motion.  Neurological: She is alert and oriented to person, place, and time. She has normal strength. No cranial nerve deficit or sensory deficit. Coordination normal. GCS eye subscore is 4. GCS verbal subscore is 5. GCS motor subscore is 6.  Skin: Skin is warm, dry and intact. No rash noted. No cyanosis.  Psychiatric: She has a normal mood and affect. Her speech is normal and behavior is normal. Thought content normal.    ED Course  Procedures (including critical care time)   Date: 06/06/2012  Rate: 143  Rhythm: sinus tachycardia  QRS Axis: normal  Intervals: normal  ST/T Wave abnormalities: LVH with repolarization abnormality  Conduction Disutrbances:none  Narrative Interpretation:   Old EKG Reviewed: tachycardia    Labs Reviewed  CBC WITH DIFFERENTIAL - Abnormal; Notable for the following:    RBC 3.85 (*)    Hemoglobin 11.2 (*)    HCT 34.0 (*)    Lymphocytes Relative 11 (*)    Monocytes Relative 22 (*)    Monocytes Absolute 1.3 (*)    Eosinophils Relative 6 (*)    All other components within normal limits  COMPREHENSIVE METABOLIC PANEL - Abnormal; Notable for the following:    Sodium 129 (*)    Chloride 93 (*)    Glucose, Bld 255 (*)    Calcium 8.3 (*)    Total Protein 5.4 (*)    Albumin 2.6 (*)    Alkaline Phosphatase 127 (*)    GFR calc non Af Amer 65 (*)    GFR calc Af Amer 75 (*)    All other components within normal limits  URINALYSIS, ROUTINE W REFLEX MICROSCOPIC - Abnormal; Notable for the following:    Glucose, UA 250 (*)    Ketones, ur >80 (*)    All other components within normal limits  LIPASE, BLOOD - Abnormal; Notable for the following:    Lipase 9 (*)    All other components within normal limits  OCCULT  BLOOD, POC DEVICE - Abnormal; Notable for the following:    Fecal Occult Bld POSITIVE (*)    All other components within normal limits  OCCULT BLOOD, POC DEVICE - Abnormal; Notable for the following:    Fecal Occult Bld POSITIVE (*)    All other components within normal limits  CULTURE, BLOOD (ROUTINE X 2)  CULTURE, BLOOD (ROUTINE X 2)  TROPONIN I  LACTIC ACID, PLASMA  PRO B NATRIURETIC PEPTIDE   Ct Abdomen Pelvis W Contrast  06/06/2012  *RADIOLOGY REPORT*  Clinical Data: Abdominal pain, mostly in the left lower quadrant.  CT ABDOMEN AND PELVIS WITH CONTRAST  Technique:  Multidetector CT imaging of the abdomen and pelvis was performed following the standard protocol during bolus administration of intravenous contrast.  Contrast: 100 ml  Comparison: 09/29/2011 and 01/30/2007  Findings: Lung bases are clear.  There is no evidence for free intraperitoneal air.  Multiple calcifications throughout the spleen are chronic and suggest old granulomatous disease.  Normal appearance of the liver and portal venous system.  The gallbladder has been removed. Normal appearance of the pancreas, adrenal glands and both kidneys. There is a 1.5 cm exophytic low-density structure along the anterior left kidney that most likely representing a cyst. This cyst  measured 0.9 cm in 2008.  There appears to be some volume averaging within this cyst but the Hounsfield units measure less than 20.  The abdominal aorta is tortuous without aneurysmal dilatation. Uterus has been removed.  No gross abnormality to the urinary bladder.  No gross abnormality to the small or large bowel.  Again noted are bilateral old pubic rami fractures.  Old bilateral rib fractures.  Again noted is scoliosis in the lumbar spine with severe degenerative changes.  There is deformity along the superior endplate of L1 that was probably present on the chest radiograph from 11/26/2010.  There appears to be residual bilateral ovarian tissue which is similar to  the previous examination.  IMPRESSION:  No acute abdominal or pelvic findings.  Scoliosis and severe degenerative changes in the spine.  Old fractures as described.  Probable left renal cyst as described.   Original Report Authenticated By: Richarda Overlie, M.D.      Diagnosis: 1. Nausea, vomiting, diarrhea 2. Dehydration with tachycardia 3. GI bleed, possibly hemorrhoidal    MDM  Patient comes to the ER for evaluation of nausea, vomiting and diarrhea with rectal bleeding. Patient had only a very small amount of blood output in the soft stools. She was complaining of abdominal discomfort, mostly left lower abdominal region. There was some tenderness in this area. Her workup, however, has been unrevealing. Blood work, urinalysis and CAT scan performed. All within normal limits, except mild anemia with hemoglobin of 11. Rectal exam did show evidence of bleeding, patient has multiple large external hemorrhoids. No passage of blood or gross bleeding here in the ER. CAT scan did not reveal any diverticulitis or other acute abnormality.  Patient was tachycardic and hypertensive at arrival. This has resolved with IV fluids and labetalol. Repeat examination reveals the patient is feeling much better. She does not have any abdominal pain at this time. She is without complaints, back to her normal baseline. No shortness of breath. Patient does use 2 L nasal cannula continuously at home, saturations are normal on oxygen.  Patient will be discharged to home with medication for nausea and vomiting. She is to followup with primary Dr. Monday. She was told to come back to the ER she has increased bleeding. She her family were counseled that she needs followup for the bleeding, possibly colonoscopy to be arranged by her primary doctor as an outpatient.        Gilda Crease, MD 06/06/12 1324

## 2012-06-06 NOTE — ED Notes (Signed)
Pt reported she felt hot and clammy, md alerted, CBG performed, CBG 243

## 2012-06-06 NOTE — ED Notes (Signed)
md at bedside

## 2012-06-06 NOTE — ED Notes (Addendum)
Per EMS, pt hx of COPD and DM, 2 L [place] continous at home. Pt reports abdominal pain x4, vomited yesterday. Has not eaten solid food in a few days, drank fluids (water). Soft stool with bright red blood last night. 2L [place] 98%, CBG 280, 18 G R AC

## 2012-06-10 DIAGNOSIS — J45909 Unspecified asthma, uncomplicated: Secondary | ICD-10-CM | POA: Diagnosis not present

## 2012-06-10 DIAGNOSIS — Z0000 Encounter for general adult medical examination without abnormal findings: Secondary | ICD-10-CM | POA: Diagnosis not present

## 2012-06-12 LAB — CULTURE, BLOOD (ROUTINE X 2): Culture: NO GROWTH

## 2012-07-29 DIAGNOSIS — E1129 Type 2 diabetes mellitus with other diabetic kidney complication: Secondary | ICD-10-CM | POA: Diagnosis not present

## 2012-08-04 DIAGNOSIS — M899 Disorder of bone, unspecified: Secondary | ICD-10-CM | POA: Diagnosis not present

## 2012-08-25 DIAGNOSIS — E119 Type 2 diabetes mellitus without complications: Secondary | ICD-10-CM | POA: Diagnosis not present

## 2012-08-27 DIAGNOSIS — M069 Rheumatoid arthritis, unspecified: Secondary | ICD-10-CM | POA: Diagnosis not present

## 2012-08-27 DIAGNOSIS — M545 Low back pain: Secondary | ICD-10-CM | POA: Diagnosis not present

## 2012-08-27 DIAGNOSIS — M79609 Pain in unspecified limb: Secondary | ICD-10-CM | POA: Diagnosis not present

## 2012-11-18 ENCOUNTER — Other Ambulatory Visit: Payer: Self-pay

## 2012-11-30 DIAGNOSIS — E119 Type 2 diabetes mellitus without complications: Secondary | ICD-10-CM | POA: Diagnosis not present

## 2012-11-30 DIAGNOSIS — E7800 Pure hypercholesterolemia, unspecified: Secondary | ICD-10-CM | POA: Diagnosis not present

## 2012-12-01 DIAGNOSIS — M545 Low back pain: Secondary | ICD-10-CM | POA: Diagnosis not present

## 2012-12-01 DIAGNOSIS — M069 Rheumatoid arthritis, unspecified: Secondary | ICD-10-CM | POA: Diagnosis not present

## 2012-12-03 DIAGNOSIS — E2839 Other primary ovarian failure: Secondary | ICD-10-CM | POA: Diagnosis not present

## 2012-12-04 DIAGNOSIS — Z006 Encounter for examination for normal comparison and control in clinical research program: Secondary | ICD-10-CM | POA: Diagnosis not present

## 2012-12-04 DIAGNOSIS — E7800 Pure hypercholesterolemia, unspecified: Secondary | ICD-10-CM | POA: Diagnosis not present

## 2012-12-04 DIAGNOSIS — E119 Type 2 diabetes mellitus without complications: Secondary | ICD-10-CM | POA: Diagnosis not present

## 2012-12-04 DIAGNOSIS — I10 Essential (primary) hypertension: Secondary | ICD-10-CM | POA: Diagnosis not present

## 2013-01-04 ENCOUNTER — Inpatient Hospital Stay (HOSPITAL_COMMUNITY)
Admission: EM | Admit: 2013-01-04 | Discharge: 2013-01-07 | DRG: 395 | Disposition: A | Discharge status: Home / Self Care | Payer: Medicare Other | Attending: Internal Medicine | Admitting: Internal Medicine

## 2013-01-04 ENCOUNTER — Encounter (HOSPITAL_COMMUNITY): Payer: Self-pay | Admitting: *Deleted

## 2013-01-04 DIAGNOSIS — J45909 Unspecified asthma, uncomplicated: Secondary | ICD-10-CM | POA: Diagnosis present

## 2013-01-04 DIAGNOSIS — K5730 Diverticulosis of large intestine without perforation or abscess without bleeding: Secondary | ICD-10-CM | POA: Diagnosis present

## 2013-01-04 DIAGNOSIS — IMO0002 Reserved for concepts with insufficient information to code with codable children: Secondary | ICD-10-CM | POA: Diagnosis not present

## 2013-01-04 DIAGNOSIS — Z860100 Personal history of colon polyps, unspecified: Secondary | ICD-10-CM

## 2013-01-04 DIAGNOSIS — K5900 Constipation, unspecified: Secondary | ICD-10-CM | POA: Diagnosis present

## 2013-01-04 DIAGNOSIS — K648 Other hemorrhoids: Principal | ICD-10-CM | POA: Diagnosis present

## 2013-01-04 DIAGNOSIS — I2510 Atherosclerotic heart disease of native coronary artery without angina pectoris: Secondary | ICD-10-CM | POA: Diagnosis present

## 2013-01-04 DIAGNOSIS — Z794 Long term (current) use of insulin: Secondary | ICD-10-CM

## 2013-01-04 DIAGNOSIS — R42 Dizziness and giddiness: Secondary | ICD-10-CM | POA: Diagnosis present

## 2013-01-04 DIAGNOSIS — E119 Type 2 diabetes mellitus without complications: Secondary | ICD-10-CM | POA: Diagnosis not present

## 2013-01-04 DIAGNOSIS — IMO0001 Reserved for inherently not codable concepts without codable children: Secondary | ICD-10-CM | POA: Diagnosis not present

## 2013-01-04 DIAGNOSIS — K589 Irritable bowel syndrome without diarrhea: Secondary | ICD-10-CM | POA: Diagnosis present

## 2013-01-04 DIAGNOSIS — R1310 Dysphagia, unspecified: Secondary | ICD-10-CM | POA: Diagnosis present

## 2013-01-04 DIAGNOSIS — I499 Cardiac arrhythmia, unspecified: Secondary | ICD-10-CM

## 2013-01-04 DIAGNOSIS — E611 Iron deficiency: Secondary | ICD-10-CM | POA: Diagnosis present

## 2013-01-04 DIAGNOSIS — D638 Anemia in other chronic diseases classified elsewhere: Secondary | ICD-10-CM | POA: Diagnosis present

## 2013-01-04 DIAGNOSIS — K219 Gastro-esophageal reflux disease without esophagitis: Secondary | ICD-10-CM | POA: Diagnosis not present

## 2013-01-04 DIAGNOSIS — M069 Rheumatoid arthritis, unspecified: Secondary | ICD-10-CM | POA: Diagnosis present

## 2013-01-04 DIAGNOSIS — K922 Gastrointestinal hemorrhage, unspecified: Secondary | ICD-10-CM | POA: Diagnosis not present

## 2013-01-04 DIAGNOSIS — Z79899 Other long term (current) drug therapy: Secondary | ICD-10-CM | POA: Diagnosis not present

## 2013-01-04 DIAGNOSIS — F411 Generalized anxiety disorder: Secondary | ICD-10-CM

## 2013-01-04 DIAGNOSIS — D649 Anemia, unspecified: Secondary | ICD-10-CM | POA: Diagnosis not present

## 2013-01-04 DIAGNOSIS — E46 Unspecified protein-calorie malnutrition: Secondary | ICD-10-CM

## 2013-01-04 DIAGNOSIS — B9562 Methicillin resistant Staphylococcus aureus infection as the cause of diseases classified elsewhere: Secondary | ICD-10-CM

## 2013-01-04 DIAGNOSIS — I10 Essential (primary) hypertension: Secondary | ICD-10-CM | POA: Diagnosis present

## 2013-01-04 DIAGNOSIS — J449 Chronic obstructive pulmonary disease, unspecified: Secondary | ICD-10-CM

## 2013-01-04 DIAGNOSIS — K644 Residual hemorrhoidal skin tags: Secondary | ICD-10-CM | POA: Diagnosis present

## 2013-01-04 DIAGNOSIS — G4730 Sleep apnea, unspecified: Secondary | ICD-10-CM

## 2013-01-04 DIAGNOSIS — Z7982 Long term (current) use of aspirin: Secondary | ICD-10-CM | POA: Diagnosis not present

## 2013-01-04 DIAGNOSIS — K2970 Gastritis, unspecified, without bleeding: Secondary | ICD-10-CM

## 2013-01-04 DIAGNOSIS — R0602 Shortness of breath: Secondary | ICD-10-CM

## 2013-01-04 DIAGNOSIS — R6510 Systemic inflammatory response syndrome (SIRS) of non-infectious origin without acute organ dysfunction: Secondary | ICD-10-CM

## 2013-01-04 DIAGNOSIS — L0231 Cutaneous abscess of buttock: Secondary | ICD-10-CM

## 2013-01-04 DIAGNOSIS — M1990 Unspecified osteoarthritis, unspecified site: Secondary | ICD-10-CM | POA: Diagnosis present

## 2013-01-04 DIAGNOSIS — R51 Headache: Secondary | ICD-10-CM

## 2013-01-04 DIAGNOSIS — D126 Benign neoplasm of colon, unspecified: Secondary | ICD-10-CM

## 2013-01-04 DIAGNOSIS — E876 Hypokalemia: Secondary | ICD-10-CM

## 2013-01-04 DIAGNOSIS — K921 Melena: Secondary | ICD-10-CM | POA: Diagnosis not present

## 2013-01-04 DIAGNOSIS — J309 Allergic rhinitis, unspecified: Secondary | ICD-10-CM

## 2013-01-04 DIAGNOSIS — D509 Iron deficiency anemia, unspecified: Secondary | ICD-10-CM

## 2013-01-04 DIAGNOSIS — K5909 Other constipation: Secondary | ICD-10-CM

## 2013-01-04 DIAGNOSIS — R000 Tachycardia, unspecified: Secondary | ICD-10-CM

## 2013-01-04 DIAGNOSIS — D500 Iron deficiency anemia secondary to blood loss (chronic): Secondary | ICD-10-CM | POA: Diagnosis present

## 2013-01-04 DIAGNOSIS — E871 Hypo-osmolality and hyponatremia: Secondary | ICD-10-CM

## 2013-01-04 DIAGNOSIS — I309 Acute pericarditis, unspecified: Secondary | ICD-10-CM

## 2013-01-04 DIAGNOSIS — N179 Acute kidney failure, unspecified: Secondary | ICD-10-CM

## 2013-01-04 DIAGNOSIS — J40 Bronchitis, not specified as acute or chronic: Secondary | ICD-10-CM | POA: Diagnosis present

## 2013-01-04 DIAGNOSIS — C44300 Unspecified malignant neoplasm of skin of unspecified part of face: Secondary | ICD-10-CM

## 2013-01-04 DIAGNOSIS — R5381 Other malaise: Secondary | ICD-10-CM | POA: Diagnosis not present

## 2013-01-04 DIAGNOSIS — K222 Esophageal obstruction: Secondary | ICD-10-CM

## 2013-01-04 LAB — CBC WITH DIFFERENTIAL/PLATELET
Basophils Absolute: 0 10*3/uL (ref 0.0–0.1)
Basophils Relative: 0 % (ref 0–1)
Eosinophils Absolute: 0 10*3/uL (ref 0.0–0.7)
Eosinophils Relative: 0 % (ref 0–5)
HCT: 14.8 % — ABNORMAL LOW (ref 36.0–46.0)
Hemoglobin: 4.6 g/dL — CL (ref 12.0–15.0)
Lymphocytes Relative: 2 % — ABNORMAL LOW (ref 12–46)
Lymphs Abs: 0.2 10*3/uL — ABNORMAL LOW (ref 0.7–4.0)
MCH: 25.8 pg — ABNORMAL LOW (ref 26.0–34.0)
MCHC: 31.1 g/dL (ref 30.0–36.0)
MCV: 83.1 fL (ref 78.0–100.0)
Monocytes Absolute: 0.2 10*3/uL (ref 0.1–1.0)
Monocytes Relative: 3 % (ref 3–12)
Neutro Abs: 8.4 10*3/uL — ABNORMAL HIGH (ref 1.7–7.7)
Neutrophils Relative %: 95 % — ABNORMAL HIGH (ref 43–77)
Platelets: 223 10*3/uL (ref 150–400)
RBC: 1.78 MIL/uL — ABNORMAL LOW (ref 3.87–5.11)
RDW: 15.8 % — ABNORMAL HIGH (ref 11.5–15.5)
WBC: 8.9 10*3/uL (ref 4.0–10.5)

## 2013-01-04 LAB — PROTIME-INR
INR: 0.88 (ref 0.00–1.49)
Prothrombin Time: 11.8 seconds (ref 11.6–15.2)

## 2013-01-04 LAB — POCT I-STAT, CHEM 8
Calcium, Ion: 0.86 mmol/L — ABNORMAL LOW (ref 1.13–1.30)
Chloride: 99 mEq/L (ref 96–112)
HCT: 15 % — ABNORMAL LOW (ref 36.0–46.0)
Hemoglobin: 5.1 g/dL — CL (ref 12.0–15.0)
TCO2: 26 mmol/L (ref 0–100)

## 2013-01-04 MED ORDER — INSULIN DETEMIR 100 UNIT/ML ~~LOC~~ SOLN
10.0000 [IU] | Freq: Every day | SUBCUTANEOUS | Status: DC
  Administered 2013-01-05: 10 [IU] via SUBCUTANEOUS
  Filled 2013-01-04 (×2): qty 0.1

## 2013-01-04 MED ORDER — LORAZEPAM 0.5 MG PO TABS
0.5000 mg | ORAL_TABLET | Freq: Three times a day (TID) | ORAL | Status: DC
  Administered 2013-01-05 – 2013-01-07 (×8): 0.5 mg via ORAL
  Filled 2013-01-04 (×8): qty 1

## 2013-01-04 MED ORDER — PREGABALIN 25 MG PO CAPS
150.0000 mg | ORAL_CAPSULE | Freq: Every day | ORAL | Status: DC
  Administered 2013-01-05 – 2013-01-06 (×3): 150 mg via ORAL
  Filled 2013-01-04: qty 1
  Filled 2013-01-04 (×3): qty 2
  Filled 2013-01-04 (×2): qty 1

## 2013-01-04 MED ORDER — ALBUTEROL SULFATE (5 MG/ML) 0.5% IN NEBU: 2.5000 mg | INHALATION_SOLUTION | Freq: Four times a day (QID) | RESPIRATORY_TRACT | Status: DC | PRN

## 2013-01-04 MED ORDER — INSULIN ASPART 100 UNIT/ML ~~LOC~~ SOLN
0.0000 [IU] | Freq: Every day | SUBCUTANEOUS | Status: DC
  Administered 2013-01-04: 5 [IU] via SUBCUTANEOUS
  Administered 2013-01-06: 3 [IU] via SUBCUTANEOUS

## 2013-01-04 MED ORDER — PREDNISONE 10 MG PO TABS
10.0000 mg | ORAL_TABLET | Freq: Every day | ORAL | Status: DC
  Administered 2013-01-05 – 2013-01-07 (×3): 10 mg via ORAL
  Filled 2013-01-04 (×3): qty 1

## 2013-01-04 MED ORDER — LABETALOL HCL 5 MG/ML IV SOLN
5.0000 mg | INTRAVENOUS | Status: DC | PRN
Start: 2013-01-04 — End: 2013-01-07
  Administered 2013-01-05 (×2): 5 mg via INTRAVENOUS
  Filled 2013-01-04 (×3): qty 4

## 2013-01-04 MED ORDER — ONDANSETRON HCL 4 MG PO TABS: 4.0000 mg | ORAL_TABLET | Freq: Four times a day (QID) | ORAL | Status: DC | PRN

## 2013-01-04 MED ORDER — PANTOPRAZOLE SODIUM 40 MG IV SOLR
40.0000 mg | Freq: Two times a day (BID) | INTRAVENOUS | Status: DC
  Administered 2013-01-05 – 2013-01-06 (×5): 40 mg via INTRAVENOUS
  Filled 2013-01-04 (×7): qty 40

## 2013-01-04 MED ORDER — LISINOPRIL 20 MG PO TABS
20.0000 mg | ORAL_TABLET | Freq: Every day | ORAL | Status: DC
  Administered 2013-01-05 – 2013-01-07 (×3): 20 mg via ORAL
  Filled 2013-01-04 (×4): qty 1

## 2013-01-04 MED ORDER — SODIUM CHLORIDE 0.9 % IJ SOLN
3.0000 mL | Freq: Two times a day (BID) | INTRAMUSCULAR | Status: DC
  Administered 2013-01-04 – 2013-01-07 (×6): 3 mL via INTRAVENOUS

## 2013-01-04 MED ORDER — ONDANSETRON HCL 4 MG/2ML IJ SOLN: 4.0000 mg | Freq: Three times a day (TID) | INTRAMUSCULAR | Status: AC | PRN

## 2013-01-04 MED ORDER — PREGABALIN 25 MG PO CAPS
75.0000 mg | ORAL_CAPSULE | Freq: Every day | ORAL | Status: DC
  Administered 2013-01-05 – 2013-01-07 (×3): 75 mg via ORAL
  Filled 2013-01-04 (×5): qty 3

## 2013-01-04 MED ORDER — ONDANSETRON HCL 4 MG/2ML IJ SOLN: 4.0000 mg | Freq: Four times a day (QID) | INTRAMUSCULAR | Status: DC | PRN

## 2013-01-04 MED ORDER — METOPROLOL SUCCINATE ER 100 MG PO TB24
100.0000 mg | ORAL_TABLET | Freq: Every day | ORAL | Status: DC
  Administered 2013-01-05 – 2013-01-07 (×3): 100 mg via ORAL
  Filled 2013-01-04 (×3): qty 1

## 2013-01-04 MED ORDER — LEVALBUTEROL TARTRATE 45 MCG/ACT IN AERO
1.0000 | INHALATION_SPRAY | RESPIRATORY_TRACT | Status: DC | PRN
  Filled 2013-01-04: qty 15

## 2013-01-04 MED ORDER — FOLIC ACID 1 MG PO TABS
1.0000 mg | ORAL_TABLET | Freq: Every day | ORAL | Status: DC
  Administered 2013-01-05 – 2013-01-07 (×3): 1 mg via ORAL
  Filled 2013-01-04 (×3): qty 1

## 2013-01-04 MED ORDER — LEFLUNOMIDE 20 MG PO TABS
20.0000 mg | ORAL_TABLET | Freq: Every day | ORAL | Status: DC
  Administered 2013-01-05 – 2013-01-07 (×3): 20 mg via ORAL
  Filled 2013-01-04 (×3): qty 1

## 2013-01-04 MED ORDER — INSULIN ASPART 100 UNIT/ML ~~LOC~~ SOLN
0.0000 [IU] | Freq: Three times a day (TID) | SUBCUTANEOUS | Status: DC
  Administered 2013-01-05: 2 [IU] via SUBCUTANEOUS
  Administered 2013-01-05: 17:00:00 9 [IU] via SUBCUTANEOUS
  Administered 2013-01-06: 18:00:00 3 [IU] via SUBCUTANEOUS
  Administered 2013-01-07: 7 [IU] via SUBCUTANEOUS

## 2013-01-04 NOTE — ED Provider Notes (Signed)
CSN: [id number]     Arrival date & time 01/04/13  1917 History   First MD Initiated Contact with Patient 01/04/13 2027     Chief Complaint  Patient presents with  . Anemia  . Abnormal Lab   (Consider location/radiation/quality/duration/timing/severity/associated sxs/prior Treatment) HPI Comments: Patient with history of hemorrhoids presents with symptomatic anemia, lightheadedness without syncope, shortness of breath, fatigue for the past several days. Patient has noted increase in bright red blood with bowel movements only. She describes a "gush" of blood. She attributes this to her "hemorrhoids being worse". She denies vomiting blood or blood in her urine. Patient wears oxygen at home at 2.5 L per minute and has increased this recently to 3 L per minute. Patient states that she had a colonoscopy performed by Dr. Russella Dar several years ago. She thinks that she may have been diagnosed with diverticulosis. She needed a transfusion (2 units) last year. Per discharge summary, reasoning at that time was due to anemia of chronic disease. Onset of symptoms gradual. Course is constant. Nothing makes symptoms better or worse.  Patient is a 71 y.o. female presenting with anemia. The history is provided by the patient and medical records.  Anemia Associated symptoms include fatigue and weakness. Pertinent negatives include no abdominal pain, chest pain, coughing, fever, headaches, myalgias, nausea, rash, sore throat or vomiting.    Past Medical History  Diagnosis Date  . Diabetic ketoacidosis 11/2010  . Myocardial infarction     Chest pain s/p normal cath in 08/2004 then NSTEMI during 11/2010 admission, negative Myoview  . Diabetes mellitus type 2, insulin dependent   . GERD (gastroesophageal reflux disease)   . Esophageal stricture     Dilation 06/2004  . Hypertension   . Supraventricular tachycardia   . history of  pericarditis 12/2002  . Pericardial effusion   . Rheumatoid arthritis(714.0)     On MTX  and chronic steroids  . Osteoarthritis   . Asthma     Home 3L O2   . Seizure 11/2008  . Rectal bleeding 11/2010    Large hemorrhoids  . Chronic anemia   . History of blood transfusion    Past Surgical History  Procedure Laterality Date  . Cholecystectomy    . Abdominal hysterectomy     History reviewed. No pertinent family history. History  Substance Use Topics  . Smoking status: Never Smoker   . Smokeless tobacco: Never Used  . Alcohol Use: No   OB History   Grav Para Term Preterm Abortions TAB SAB Ect Mult Living                 Review of Systems  Constitutional: Positive for fatigue. Negative for fever.  HENT: Negative for sore throat and rhinorrhea.   Eyes: Negative for redness.  Respiratory: Positive for shortness of breath. Negative for cough.   Cardiovascular: Negative for chest pain.  Gastrointestinal: Positive for blood in stool. Negative for nausea, vomiting, abdominal pain and diarrhea.  Genitourinary: Negative for dysuria.  Musculoskeletal: Negative for myalgias.  Skin: Negative for rash.  Neurological: Positive for weakness and light-headedness. Negative for syncope and headaches.    Allergies  Demerol and Penicillins  Home Medications   Current Outpatient Rx  Name  Route  Sig  Dispense  Refill  . EXPIRED: albuterol (PROVENTIL) (5 MG/ML) 0.5% nebulizer solution   Nebulization   Take 0.5 mLs (2.5 mg total) by nebulization every 6 (six) hours as needed for wheezing or shortness of breath.   20 mL  0   . aspirin EC 81 MG tablet   Oral   Take 81 mg by mouth daily.         . Cholecalciferol (VITAMIN D) 2000 UNITS CAPS   Oral   Take 2,000 capsules by mouth daily.         . Coenzyme Q10 (CO Q 10) 100 MG CAPS   Oral   Take 100 mg by mouth daily.         Marland Kitchen docusate sodium (COLACE) 100 MG capsule   Oral   Take 1 capsule (100 mg total) by mouth daily as needed for constipation. Daily as needed   10 capsule   0   . esomeprazole (NEXIUM) 40  MG capsule   Oral   Take 40 mg by mouth 2 (two) times daily.          Marland Kitchen estrogens, conjugated, (PREMARIN) 0.625 MG tablet   Oral   Take 0.625 mg by mouth daily. Take daily for 21 days then do not take for 7 days.          . FeFum-FePoly-FA-B Cmp-C-Biot (INTEGRA PLUS) CAPS   Oral   Take 1 capsule by mouth daily. Take 1 cap daily.   30 capsule   4   . folic acid (FOLVITE) 1 MG tablet   Oral   Take 1 mg by mouth daily.           Marland Kitchen HYDROcodone-acetaminophen (VICODIN) 5-500 MG per tablet   Oral   Take 1 tablet by mouth every 6 (six) hours as needed. Pain         . insulin aspart (NOVOLOG FLEXPEN) 100 UNIT/ML injection   Subcutaneous   Inject 3-10 Units into the skin 3 (three) times daily before meals. Sliding scale         . insulin detemir (LEVEMIR FLEXPEN) 100 UNIT/ML injection   Subcutaneous   Inject 25 Units into the skin at bedtime.         Marland Kitchen leflunomide (ARAVA) 20 MG tablet   Oral   Take 20 mg by mouth daily.         Marland Kitchen levalbuterol (XOPENEX HFA) 45 MCG/ACT inhaler   Inhalation   Inhale 1-2 puffs into the lungs every 4 (four) hours as needed for wheezing.         Marland Kitchen lisinopril (PRINIVIL,ZESTRIL) 20 MG tablet   Oral   Take 20 mg by mouth daily.         Marland Kitchen LORazepam (ATIVAN) 0.5 MG tablet   Oral   Take 0.5 mg by mouth every 8 (eight) hours.         . metFORMIN (GLUCOPHAGE) 500 MG tablet   Oral   Take 500 mg by mouth 2 (two) times daily with a meal.         . metoprolol (TOPROL XL) 100 MG 24 hr tablet   Oral   Take 100 mg by mouth daily.          . Multiple Vitamin (MULTIVITAMINS PO)   Oral   Take 1 tablet by mouth. Daily.          . ondansetron (ZOFRAN) 4 MG tablet   Oral   Take 1 tablet (4 mg total) by mouth every 6 (six) hours.   12 tablet   0   . phenylephrine-shark liver oil-mineral oil-petrolatum (PREPARATION H) 0.25-3-14-71.9 % rectal ointment   Rectal   Place 1 application rectally 2 (two) times daily as needed for  hemorrhoids.         Marland Kitchen  potassium chloride SA (K-DUR,KLOR-CON) 20 MEQ tablet   Oral   Take 20 mEq by mouth 2 (two) times daily.          . predniSONE (DELTASONE) 10 MG tablet   Oral   Take 10 mg by mouth daily.         . pregabalin (LYRICA) 75 MG capsule   Oral   Take 75 mg by mouth 2 (two) times daily. 1 IN THE AM AND 2 IN THE PM         . simethicone (MYLICON) 80 MG chewable tablet   Oral   Chew 80 mg by mouth every 6 (six) hours as needed (upest stomach).         . vitamin A 8000 UNIT capsule   Oral   Take 8,000 Units by mouth daily.          BP 161/68  Pulse 94  Temp(Src) 98.7 F (37.1 C) (Oral)  Resp 20  SpO2 100% Physical Exam  Nursing note and vitals reviewed. Constitutional: She appears well-developed and well-nourished.  HENT:  Head: Normocephalic and atraumatic.  Eyes: Right eye exhibits no discharge. Left eye exhibits no discharge.  Conjunctiva pale  Neck: Normal range of motion. Neck supple.  Cardiovascular: Normal rate, regular rhythm and normal heart sounds.   Pulmonary/Chest: Effort normal and breath sounds normal.  Abdominal: Soft. There is no tenderness.  Genitourinary: Rectal exam shows external hemorrhoid (no active bleeding) and internal hemorrhoid. Rectal exam shows anal tone normal.  Neurological: She is alert.  Skin: Skin is warm and dry.  Psychiatric: She has a normal mood and affect.    ED Course  Procedures (including critical care time) Labs Review Labs Reviewed  CBC WITH DIFFERENTIAL - Abnormal; Notable for the following:    RBC 1.78 (*)    Hemoglobin 4.6 (*)    HCT 14.8 (*)    MCH 25.8 (*)    RDW 15.8 (*)    Neutrophils Relative % 95 (*)    Neutro Abs 8.4 (*)    Lymphocytes Relative 2 (*)    Lymphs Abs 0.2 (*)    All other components within normal limits  POCT I-STAT, CHEM 8 - Abnormal; Notable for the following:    Sodium 132 (*)    Potassium 5.5 (*)    Creatinine, Ser 1.20 (*)    Glucose, Bld 270 (*)     Calcium, Ion 0.86 (*)    Hemoglobin 5.1 (*)    HCT 15.0 (*)    All other components within normal limits  PREPARE RBC (CROSSMATCH)  TYPE AND SCREEN   Imaging Review No results found.  8:57 PM Patient seen and examined. Work-up initiated. Blood ordered. Discussed with Dr. Juleen China.   Vital signs reviewed and are as follows: Filed Vitals:   01/04/13 1927  BP: 161/68  Pulse: 94  Temp: 98.7 F (37.1 C)  Resp: 20   10:20 PM Blood ordered. Spoke with [facility] who will admit.    MDM   1. Anemia   2. Lower GI bleeding    Admit for symptomatic anemia.    Renne Crigler, PA-C 01/04/13 2221

## 2013-01-04 NOTE — ED Notes (Addendum)
Pt went to PCP to have blood drawn. PCP reports hemoglobin of 4.7. Pt c/o dizziness, shortness of breath, pt on home O2 at 2.5 lpm. Pt denies chest pain. Pt denies blood thinners. Pt states she has hemorrhoids that are currently bleeding, bright red blood in stool. Pt is A&Ox4, respirations equal and unlabored, skin warm and dry

## 2013-01-04 NOTE — H&P (Addendum)
[facility] History and Physical  Patient: Marisa [name]  ZOX:[id number]  DOB: 12/04/1940  DOA: 01/04/2013  Referring physician: Renne Crigler, PA-C PCP: Juline Patch, MD  Consults:   gastroenterology  Chief Complaint: Anemia  HPI: Marisa [name] is a 72 y.o. female with Past medical history of diabetes, CAD, GERD, hypertension, anemia of chronic disease. The patient presented today as she went to her PCPs office due to 3 weeks off fatigue and tiredness. She says that since last 3 weeks she has been having bright red blood per rectum with every bowel movement which is in significant amount. Patient denies any complaint of syncope dizziness lightheadedness. She denies any complaints of hematemesis or vomiting of blood. She denies any coffee ground emesis. She does have complaints of gastric reflux but denies any worsening of the same. She denies any abdominal pain at present. She denies any chest pain shortness of breath or chest tightness. She denies any dizziness or lightheadedness or focal neurological deficit. She mentions that she has bright red blood per rectum in mild pain with the bowel movements but does not have any significant severe  pain in the anal region. She denies being on any blood thinners and she denies bleeding anywhere as. She denies cough she denies burning urination. Recently she has been having recurrent upper respiratory tract infection and has been placed on azithromycin. She denies any exposure with small kids with rash.  Review of Systems: as mentioned in the history of present illness.  A Comprehensive review of the other systems is negative.  Past Medical History  Diagnosis Date  . Diabetic ketoacidosis 11/2010  . Myocardial infarction     Chest pain s/p normal cath in 08/2004 then NSTEMI during 11/2010 admission, negative Myoview  . Diabetes mellitus type 2, insulin dependent   . GERD (gastroesophageal reflux disease)   . Esophageal stricture      Dilation 06/2004  . Hypertension   . Supraventricular tachycardia   . history of  pericarditis 12/2002  . Pericardial effusion   . Rheumatoid arthritis(714.0)     On MTX and chronic steroids  . Osteoarthritis   . Asthma     Home 3L O2   . Seizure 11/2008  . Rectal bleeding 11/2010    Large hemorrhoids  . Chronic anemia   . History of blood transfusion    Past Surgical History  Procedure Laterality Date  . Cholecystectomy    . Abdominal hysterectomy     Social History:  reports that she has never smoked. She has never used smokeless tobacco. She reports that she does not drink alcohol or use illicit drugs. Patient is coming from home.  Independent for most of her  ADL.  Allergies  Allergen Reactions  . Demerol [Meperidine] Other (See Comments)    Drives me crazy   . Penicillins Other (See Comments)    Throat swelling    History reviewed. No pertinent family history.  Prior to Admission medications   Medication Sig Start Date End Date Taking? Authorizing Provider  aspirin EC 81 MG tablet Take 81 mg by mouth daily.   Yes Historical Provider, MD  Cholecalciferol (VITAMIN D) 2000 UNITS CAPS Take 2,000 capsules by mouth daily.   Yes Historical Provider, MD  Coenzyme Q10 (CO Q 10) 100 MG CAPS Take 100 mg by mouth daily.   Yes Historical Provider, MD  docusate sodium (COLACE) 100 MG capsule Take 1 capsule (100 mg total) by mouth daily as needed for constipation. Daily as needed  10/08/11  Yes Nishant Dhungel, MD  esomeprazole (NEXIUM) 40 MG capsule Take 40 mg by mouth 2 (two) times daily.    Yes Historical Provider, MD  estrogens, conjugated, (PREMARIN) 0.625 MG tablet Take 0.625 mg by mouth daily.    Yes Historical Provider, MD  FeFum-FePoly-FA-B Cmp-C-Biot (INTEGRA PLUS) CAPS Take 1 capsule by mouth daily. Take 1 cap daily. 02/27/12  Yes Marinda Elk, MD  folic acid (FOLVITE) 1 MG tablet Take 1 mg by mouth daily.     Yes Historical Provider, MD  HYDROcodone-acetaminophen  (NORCO/VICODIN) 5-325 MG per tablet Take 2 tablets by mouth every 6 (six) hours as needed for pain.   Yes Historical Provider, MD  insulin aspart (NOVOLOG FLEXPEN) 100 UNIT/ML injection Inject 3-12 Units into the skin 3 (three) times daily before meals. Sliding scale   Yes Historical Provider, MD  insulin detemir (LEVEMIR FLEXPEN) 100 UNIT/ML injection Inject 22 Units into the skin daily at 12 noon.    Yes Historical Provider, MD  leflunomide (ARAVA) 20 MG tablet Take 20 mg by mouth daily.   Yes Historical Provider, MD  levalbuterol [facility] HFA) 45 MCG/ACT inhaler Inhale 1-2 puffs into the lungs every 4 (four) hours as needed for wheezing.   Yes Historical Provider, MD  lisinopril (PRINIVIL,ZESTRIL) 20 MG tablet Take 20 mg by mouth daily.   Yes Historical Provider, MD  LORazepam (ATIVAN) 0.5 MG tablet Take 0.5 mg by mouth every 8 (eight) hours.   Yes Historical Provider, MD  metoprolol (TOPROL XL) 100 MG 24 hr tablet Take 100 mg by mouth daily.    Yes Historical Provider, MD  Multiple Vitamin (MULTIVITAMINS PO) Take 1 tablet by mouth. Daily.    Yes Historical Provider, MD  phenylephrine-shark liver oil-mineral oil-petrolatum (PREPARATION H) 0.25-3-14-71.9 % rectal ointment Place 1 application rectally 2 (two) times daily as needed for hemorrhoids.   Yes Historical Provider, MD  potassium chloride SA (K-DUR,KLOR-CON) 20 MEQ tablet Take 20 mEq by mouth 2 (two) times daily.    Yes Historical Provider, MD  predniSONE (DELTASONE) 10 MG tablet Take 10 mg by mouth daily.   Yes Historical Provider, MD  pregabalin (LYRICA) 75 MG capsule Take 75 mg by mouth 2 (two) times daily. 1 IN THE AM AND 2 IN THE PM   Yes Historical Provider, MD  vitamin A 8000 UNIT capsule Take 8,000 Units by mouth daily.   Yes Historical Provider, MD  albuterol (PROVENTIL) (5 MG/ML) 0.5% nebulizer solution Take 0.5 mLs (2.5 mg total) by nebulization every 6 (six) hours as needed for wheezing or shortness of breath. 10/08/11 10/07/12   Theda Belfast Dhungel, MD    Physical Exam: Filed Vitals:   01/04/13 2130 01/04/13 2145 01/04/13 2200 01/04/13 2233  BP: 185/77 179/89 162/130 182/114  Pulse: 100 100 98 106  Temp:      TempSrc:      Resp: 15 23 15 16   SpO2: 100% 100% 100% 100%    General: Alert, Awake and Oriented to Time, Place and Person. Appear in no distress Eyes: PERRL ENT: Oral Mucosa clear moist. Neck: No JVD, no Carotid Bruits  Cardiovascular: S1 and S2 Present, no Murmur, Peripheral Pulses Present Respiratory: Bilateral Air entry equal and Decreased, Clear to Auscultation,  No Crackles, no wheezes Abdomen: Bowel Sound Present, Soft and Non tender Skin: No Rash Extremities: No Pedal edema, no calf tenderness Neurologic: Grossly Unremarkable.  Labs on Admission:  CBC:  Recent Labs Lab 01/04/13 1930 01/04/13 1939  WBC 8.9  --  NEUTROABS 8.4*  --   HGB 4.6* 5.1*  HCT 14.8* 15.0*  MCV 83.1  --   PLT 223  --     CMP     Component Value Date/Time   NA 132* 01/04/2013 1939   K 5.5* 01/04/2013 1939   CL 99 01/04/2013 1939   CO2 23 06/06/2012 1000   GLUCOSE 270* 01/04/2013 1939   BUN 15 01/04/2013 1939   CREATININE 1.20* 01/04/2013 1939   CALCIUM 8.3* 06/06/2012 1000   PROT 5.4* 06/06/2012 1000   ALBUMIN 2.6* 06/06/2012 1000   AST 14 06/06/2012 1000   ALT 14 06/06/2012 1000   ALKPHOS 127* 06/06/2012 1000   BILITOT 0.3 06/06/2012 1000   GFRNONAA 65* 06/06/2012 1000   GFRAA 75* 06/06/2012 1000    No results found for this basename: LIPASE, AMYLASE,  in the last 168 hours No results found for this basename: AMMONIA,  in the last 168 hours  Cardiac Enzymes: No results found for this basename: CKTOTAL, CKMB, CKMBINDEX, TROPONINI,  in the last 168 hours  BNP (last 3 results)  Recent Labs  06/06/12 1000  PROBNP 405.9*    Radiological Exams on Admission: No results found.   Assessment/Plan Principal Problem:   Anemia Active Problems:   HYPERTENSION   HEMORRHOIDS, INTERNAL   BRONCHITIS    ASTHMA   Rheumatoid arthritis(714.0)   1. Anemia The patient's presentation of anemia appears to be acute as she has blood work with her that was started in the end of August at which time her hemoglobin was 8.3. today her hemoglobin is 4.6. The only bleeding that she is complaining is hemorrhoidal bleeding bright red blood per rectum. She denies any other symptoms other than occasional abdominal pain. She denies any other bleeding. No trauma no coagulopathy. With this the patient is already getting 2 units of blood. I will check another CBC after that blood. I would also admit the patient under telemetry to absorb. Gastroenterology will be consulted for further management. As it does not appear to be upper GI bleeding I would only give her 10 Protonix every 12 hours. I would also keep the patient nothing by mouth for possible procedure. I would also hold antiplatelets  2. Hypertension She appears to be having accelerated hypertension the etiology could be anxiety. I will give her IV labetalol as needed and I would also continue her on home dose of antihypertensive. I would continue her antianxiety medications.  3. Diabetes mellitus  At present I would hold her insulin Levemir greater than also put her on sliding scale only. I would continue her Levemir from tomorrow.  4. Rheumatoid arthritis Continue redness on and left him a mild. I would also continue Lyrica for her neuropathy  DVT Prophylaxis: mechanical compression device Nutrition: N.p.o.  Code Status: Full  Family Communication: Family was present at bedside, opportunity was given to the family to ask question and all questions were answered satisfactorily at the time of interview. Disposition: Admitted to inpatient in telemetry.  Author: Lynden Oxford, MD [facility] Pager: [phone] 01/04/2013, 10:53 PM    If 7PM-7AM, please contact night-coverage [email or web address] Password [email or web address]

## 2013-01-04 NOTE — ED Notes (Signed)
Transporting patient to main hospital.

## 2013-01-04 NOTE — ED Notes (Signed)
Pt states she went to her PCP to have follow up blood work done and her PCP told her to come to the ED because of her blood levels. Pt states that she does have hemorrhoids and they have been bleeding.

## 2013-01-05 ENCOUNTER — Encounter (HOSPITAL_COMMUNITY): Payer: Self-pay | Admitting: Physician Assistant

## 2013-01-05 DIAGNOSIS — K921 Melena: Secondary | ICD-10-CM | POA: Diagnosis present

## 2013-01-05 DIAGNOSIS — D649 Anemia, unspecified: Secondary | ICD-10-CM | POA: Diagnosis not present

## 2013-01-05 DIAGNOSIS — K648 Other hemorrhoids: Secondary | ICD-10-CM

## 2013-01-05 DIAGNOSIS — E119 Type 2 diabetes mellitus without complications: Secondary | ICD-10-CM | POA: Diagnosis not present

## 2013-01-05 DIAGNOSIS — Z860100 Personal history of colon polyps, unspecified: Secondary | ICD-10-CM

## 2013-01-05 DIAGNOSIS — Z794 Long term (current) use of insulin: Secondary | ICD-10-CM | POA: Diagnosis not present

## 2013-01-05 LAB — GLUCOSE, CAPILLARY
Glucose-Capillary: 162 mg/dL — ABNORMAL HIGH (ref 70–99)
Glucose-Capillary: 191 mg/dL — ABNORMAL HIGH (ref 70–99)
Glucose-Capillary: 178 mg/dL — ABNORMAL HIGH (ref 70–99)
Glucose-Capillary: 418 mg/dL — ABNORMAL HIGH (ref 70–99)
Glucose-Capillary: 71 mg/dL (ref 70–99)

## 2013-01-05 LAB — CBC WITH DIFFERENTIAL/PLATELET
Basophils Relative: 0 % (ref 0–1)
Eosinophils Absolute: 0.1 10*3/uL (ref 0.0–0.7)
HCT: 24.4 % — ABNORMAL LOW (ref 36.0–46.0)
Hemoglobin: 8 g/dL — ABNORMAL LOW (ref 12.0–15.0)
Lymphs Abs: 1.2 10*3/uL (ref 0.7–4.0)
MCH: 26.7 pg (ref 26.0–34.0)
MCHC: 32.8 g/dL (ref 30.0–36.0)
MCV: 81.3 fL (ref 78.0–100.0)
Monocytes Absolute: 1.1 10*3/uL — ABNORMAL HIGH (ref 0.1–1.0)
Monocytes Relative: 13 % — ABNORMAL HIGH (ref 3–12)
Neutro Abs: 6.1 10*3/uL (ref 1.7–7.7)
Neutrophils Relative %: 72 % (ref 43–77)
RBC: 3 MIL/uL — ABNORMAL LOW (ref 3.87–5.11)

## 2013-01-05 LAB — COMPREHENSIVE METABOLIC PANEL
ALT: 8 U/L (ref 0–35)
AST: 11 U/L (ref 0–37)
Albumin: 2.9 g/dL — ABNORMAL LOW (ref 3.5–5.2)
Alkaline Phosphatase: 89 U/L (ref 39–117)
BUN: 14 mg/dL (ref 6–23)
CO2: 29 mEq/L (ref 19–32)
Chloride: 98 mEq/L (ref 96–112)
Creatinine, Ser: 1 mg/dL (ref 0.50–1.10)
GFR calc Af Amer: 64 mL/min — ABNORMAL LOW (ref >90)
GFR calc non Af Amer: 55 mL/min — ABNORMAL LOW (ref >90)
Glucose, Bld: 156 mg/dL — ABNORMAL HIGH (ref 70–99)
Potassium: 3.8 mEq/L (ref 3.5–5.1)
Sodium: 137 mEq/L (ref 135–145)
Total Bilirubin: 0.9 mg/dL (ref 0.3–1.2)

## 2013-01-05 LAB — PROTIME-INR
INR: 0.9 (ref 0.00–1.49)
Prothrombin Time: 12 seconds (ref 11.6–15.2)

## 2013-01-05 LAB — MRSA PCR SCREENING: MRSA by PCR: NEGATIVE

## 2013-01-05 MED ORDER — HYDRALAZINE HCL 20 MG/ML IJ SOLN: 5.0000 mg | Freq: Four times a day (QID) | INTRAMUSCULAR | Status: DC | PRN

## 2013-01-05 MED ORDER — PEG-KCL-NACL-NASULF-NA ASC-C 100 G PO SOLR
0.5000 | Freq: Once | ORAL | Status: AC
  Administered 2013-01-05: 100 g via ORAL
  Filled 2013-01-05: qty 1

## 2013-01-05 MED ORDER — PEG-KCL-NACL-NASULF-NA ASC-C 100 G PO SOLR
0.5000 | Freq: Once | ORAL | Status: AC
  Administered 2013-01-06: 05:00:00 100 g via ORAL

## 2013-01-05 MED ORDER — PEG-KCL-NACL-NASULF-NA ASC-C 100 G PO SOLR
1.0000 | Freq: Once | ORAL | Status: DC
Start: 2013-01-05 — End: 2013-01-05

## 2013-01-05 MED ORDER — INSULIN DETEMIR 100 UNIT/ML ~~LOC~~ SOLN
20.0000 [IU] | Freq: Every day | SUBCUTANEOUS | Status: DC
  Administered 2013-01-05: 18:00:00 20 [IU] via SUBCUTANEOUS
  Filled 2013-01-05 (×3): qty 0.2

## 2013-01-05 MED ORDER — SODIUM CHLORIDE 0.9 % IV SOLN
INTRAVENOUS | Status: DC
  Administered 2013-01-05: 17:00:00 via INTRAVENOUS

## 2013-01-05 NOTE — Consult Note (Signed)
[facility] Consult: 12:54 PM 01/05/2013   Referring Provider: Dr Ortiz, [facility]. Primary Care Physician:  PANG,RICHARD, MD Primary Gastroenterologist:  Dr. Marbeth Smedley, previously Dr Orr  Reason for Consultation:  Hematochezia.   HPI: Marisa [name] is a 71 y.o. female.  Hx RA treated with chronic prednisone and Arava. Type 2 IDDM.  Anemia of chronic disease.  Hx IBS-C, esophageal stricture, adenomatous colon polyp in 2005, has not had follow up colonoscopy. Takes Nexium BID. Has chronic constipation.  Stool surrounded by blood has been issue for at least one year. Was occurring about once per month.  In last 4 weeks occurrences of 2 x weekly, whenever she had a stool.  Blood mostly BRB, sometime darker/marroon.  Blood generally separate from the otherwise brown stool.  There is sense of painful pressure in rectum.  Using a yet to be named prescription hemorrhoidal cream which provides better control of rectal pain than does the Preparation H. No abdominal pain, no nausea, no dysphagia. No NSAIDs but does use 6 Hydrocodone 5/325 daily.  Transfused 2 units PRBCs overnight.  Hgb up to 8.0 from 4.6 yesterday.  Comp of 11.2 in Feb 2014, 9.4 in 02/2012.    Past Medical History  Diagnosis Date  . Diabetic ketoacidosis 11/2010  . Myocardial infarction     Chest pain s/p normal cath in 08/2004 then NSTEMI during 11/2010 admission, negative Myoview  . Diabetes mellitus type 2, insulin dependent     initial diagnoses 11/2008  . GERD (gastroesophageal reflux disease)   . Esophageal stricture 06/2004    Dilation 06/2004  . Hypertension   . Supraventricular tachycardia   . history of  pericarditis 12/2002  . Pericardial effusion   . Rheumatoid arthritis(714.0)     On MTX and chronic steroids  . Osteoarthritis   . Asthma     Home 3L O2   . Seizure 11/2008  . Rectal bleeding 11/2010    Large hemorrhoids  . Chronic anemia   . History of blood  transfusion     Past Surgical History  Procedure Laterality Date  . Cholecystectomy    . Abdominal hysterectomy    . Incise and drain abcess  09/2011    I&D of peri-rectal abcess per Dr Rosenbower.     Prior to Admission medications   Medication Sig Start Date End Date Taking? Authorizing Provider  aspirin EC 81 MG tablet Take 81 mg by mouth daily.   Yes Historical Provider, MD  Cholecalciferol (VITAMIN D) 2000 UNITS CAPS Take 2,000 capsules by mouth daily.   Yes Historical Provider, MD  Coenzyme Q10 (CO Q 10) 100 MG CAPS Take 100 mg by mouth daily.   Yes Historical Provider, MD  docusate sodium (COLACE) 100 MG capsule Take 1 capsule (100 mg total) by mouth daily as needed for constipation. Daily as needed 10/08/11  Yes Nishant Dhungel, MD  esomeprazole (NEXIUM) 40 MG capsule Take 40 mg by mouth 2 (two) times daily.    Yes Historical Provider, MD  estrogens, conjugated, (PREMARIN) 0.625 MG tablet Take 0.625 mg by mouth daily.    Yes Historical Provider, MD  FeFum-FePoly-FA-B Cmp-C-Biot (INTEGRA PLUS) CAPS Take 1 capsule by mouth daily. Take 1 cap daily. 02/27/12  Yes Abraham Feliz Ortiz, MD  folic acid (FOLVITE) 1 MG tablet Take 1 mg by mouth daily.     Yes Historical Provider, MD  HYDROcodone-acetaminophen (NORCO/VICODIN) 5-325 MG per tablet Take 2 tablets by mouth every 6 (six) hours as needed for pain.     Yes Historical Provider, MD  insulin aspart (NOVOLOG FLEXPEN) 100 UNIT/ML injection Inject 3-12 Units into the skin 3 (three) times daily before meals. Sliding scale   Yes Historical Provider, MD  insulin detemir (LEVEMIR FLEXPEN) 100 UNIT/ML injection Inject 22 Units into the skin daily at 12 noon.    Yes Historical Provider, MD  leflunomide (ARAVA) 20 MG tablet Take 20 mg by mouth daily.   Yes Historical Provider, MD  levalbuterol (XOPENEX HFA) 45 MCG/ACT inhaler Inhale 1-2 puffs into the lungs every 4 (four) hours as needed for wheezing.   Yes Historical Provider, MD  lisinopril  (PRINIVIL,ZESTRIL) 20 MG tablet Take 20 mg by mouth daily.   Yes Historical Provider, MD  LORazepam (ATIVAN) 0.5 MG tablet Take 0.5 mg by mouth every 8 (eight) hours.   Yes Historical Provider, MD  metoprolol (TOPROL XL) 100 MG 24 hr tablet Take 100 mg by mouth daily.    Yes Historical Provider, MD  Multiple Vitamin (MULTIVITAMINS PO) Take 1 tablet by mouth. Daily.    Yes Historical Provider, MD  phenylephrine-shark liver oil-mineral oil-petrolatum (PREPARATION H) 0.25-3-14-71.9 % rectal ointment Place 1 application rectally 2 (two) times daily as needed for hemorrhoids.   Yes Historical Provider, MD  potassium chloride SA (K-DUR,KLOR-CON) 20 MEQ tablet Take 20 mEq by mouth 2 (two) times daily.    Yes Historical Provider, MD  predniSONE (DELTASONE) 10 MG tablet Take 10 mg by mouth daily.   Yes Historical Provider, MD  pregabalin (LYRICA) 75 MG capsule Take 75 mg by mouth 2 (two) times daily. 1 IN THE AM AND 2 IN THE PM   Yes Historical Provider, MD  vitamin A 8000 UNIT capsule Take 8,000 Units by mouth daily.   Yes Historical Provider, MD  albuterol (PROVENTIL) (5 MG/ML) 0.5% nebulizer solution Take 0.5 mLs (2.5 mg total) by nebulization every 6 (six) hours as needed for wheezing or shortness of breath. 10/08/11 10/07/12  Nishant Dhungel, MD    Scheduled Meds: . folic acid  1 mg Oral Daily  . insulin aspart  0-5 Units Subcutaneous QHS  . insulin aspart  0-9 Units Subcutaneous TID WC  . insulin detemir  10 Units Subcutaneous Q1200  . leflunomide  20 mg Oral Daily  . lisinopril  20 mg Oral Daily  . LORazepam  0.5 mg Oral Q8H  . metoprolol succinate  100 mg Oral Daily  . pantoprazole (PROTONIX) IV  40 mg Intravenous Q12H  . predniSONE  10 mg Oral Daily  . pregabalin  150 mg Oral QHS  . pregabalin  75 mg Oral Daily  . sodium chloride  3 mL Intravenous Q12H   Infusions:   PRN Meds: albuterol, hydrALAZINE, labetalol, levalbuterol, ondansetron (ZOFRAN) IV, ondansetron   Allergies as of  01/04/2013 - Review Complete 01/04/2013  Allergen Reaction Noted  . Demerol [meperidine] Other (See Comments) 09/29/2011  . Penicillins Other (See Comments) 09/29/2011    History reviewed. No pertinent family history.  History   Social History  . Marital Status: Married    Spouse Name: N/A    Number of Children: N/A  . Years of Education: N/A   Occupational History  . Not on file.   Social History Main Topics  . Smoking status: Never Smoker   . Smokeless tobacco: Never Used  . Alcohol Use: No  . Drug Use: No  . Sexual Activity: Not on file   Other Topics Concern  . Not on file   Social History Narrative   Pt lives   at home with her husband and has a son and daughter. She is still ambulatory occasionally using a cane or walker. No toxic habits.     REVIEW OF SYSTEMS: Constitutional:  Generally increased fatigue ENT:  No nose bleeds Pulm:  Recent Zithromax for URI, completed 9/19.  No cough at present.   CV:  No chest pain, no palpitions but does feel rapid rate occasionally.  No pedal edema GU:  Frequency and urgency from "old age" bladder issues GI:  Per HPI Heme:  Tolerates po Iron, no recall of treatment with parenteral iron.    Transfusions:  yes Neuro:  No dizziness.  Balance is compromised.  Uses combo of cane, walker and wheelchair Derm:  Some pruritic dry skin along upper sternum.  No sores.  Generally dry skin. Endocrine:  No excessive thirst or urination. No sweats.  Sugars range 120s-170s but as high as 360 a few days ago. Immunization:  No flu shot yet, prefers to get this when she is feeling better.  Travel:  none   PHYSICAL EXAM: Vital signs in last 24 hours: Temp:  [97.6 F (36.4 C)-99 F (37.2 C)] 98.6 F (37 C) (09/23 0934) Pulse Rate:  [91-116] 108 (09/23 0934) Resp:  [15-23] 20 (09/23 0934) BP: (156-185)/(52-153) 170/73 mmHg (09/23 0934) SpO2:  [96 %-100 %] 96 % (09/22 2315) Weight:  [58.196 kg (128 lb 4.8 oz)] 58.196 kg (128 lb 4.8 oz)  (09/22 2302)  General: pleasant, somewhat chronically ill appearing WF.   Head:  No asymmetry or facial edema  Eyes:  No icterus, EOMI, slight conjunctival pallor Ears:  Not HOH  Nose:  Slight blood from nasal passages on occasion.  No epistaxis of any magnitude Mouth:  Clear, moist. Neck:  No TMG or JVD.  No bruit Lungs:  Clear bil.  repirations quiet, unlabored, even. Heart: slightly tachy, regular.  No MRG Abdomen:  Soft, no mass or tenderness,  No bruits.  No HSM.  No hernia.   Rectal: large, non-thrombosed hemorrhoids, cauliflower-like.    Musc/Skeltl: rheumatoid digital deviation in fingers.  Kyphosis.  Extremities:  No pedal edema  Neurologic:  No tremor.  Oriented x 3.  Excellent recall of details.  Skin:  No sores, no telangectasia.  Some purpura on arms and legs Tattoos:  none Nodes:  No cervical or inguinal adenopathy.    Psych:  Pleasant, cooperative, in good spirits.   Intake/Output from previous day: 09/22 0701 - 09/23 0700 In: 744.5 [I.V.:100; Blood:644.5] Out: 500 [Urine:500] Intake/Output this shift: Total I/O In: 3 [I.V.:3] Out: 500 [Urine:500]  LAB RESULTS:  Recent Labs  01/04/13 1930 01/04/13 1939 01/05/13 0835  WBC 8.9  --  8.4  HGB 4.6* 5.1* 8.0*  HCT 14.8* 15.0* 24.4*  PLT 223  --  183   BMET Lab Results  Component Value Date   NA 137 01/05/2013   NA 132* 01/04/2013   NA 129* 06/06/2012   K 3.8 01/05/2013   K 5.5* 01/04/2013   K 3.6 06/06/2012   CL 98 01/05/2013   CL 99 01/04/2013   CL 93* 06/06/2012   CO2 29 01/05/2013   CO2 23 06/06/2012   CO2 25 02/27/2012   GLUCOSE 156* 01/05/2013   GLUCOSE 270* 01/04/2013   GLUCOSE 255* 06/06/2012   BUN 14 01/05/2013   BUN 15 01/04/2013   BUN 9 06/06/2012   CREATININE 1.00 01/05/2013   CREATININE 1.20* 01/04/2013   CREATININE 0.88 06/06/2012   CALCIUM 8.6 01/05/2013   CALCIUM 8.3*   06/06/2012   CALCIUM 8.1* 02/27/2012   LFT  Recent Labs  01/05/13 0835  PROT 5.8*  ALBUMIN 2.9*  AST 11  ALT 8  ALKPHOS  89  BILITOT 0.9   PT/INR Lab Results  Component Value Date   INR 0.90 01/05/2013   INR 0.88 01/04/2013     RADIOLOGY STUDIES: No results found.  ENDOSCOPIC STUDIES: 02/2007  EGD  For abdominal pain, dysphagia. No esophageal stricture but was empirically dilated. Gastritis noted. Pathology:  Mild chronic gastritis.  No H Pylori present.      06/2004  EGD  For dysphagia. Esophageal stricture dilated  09/2003  Colonoscopy Single adenomatous polyp and internal hemorrhoids. Due for repeat colon 09/2008.   04/1991 ERCP  normal post cholecystectomy cholangiogram and a normal ampulla.  Air bubble in CBD   IMPRESSION: *  Hematochezia.  Present for several months but accelerating occurences in last 4 weeks.  Known large hemorrhoids. Overdue for follow up adenomatous polyp monitoring; index polyp was 09/2003. *  Acute on chronic anemia. Takes po iron, folic acid daily.   S/p excellent response to 2 units PRBCs  *  Chronic constipation.  Chronic narcotics and daily po Iron contributing to this.  *  Progressive, gradual weight loss over 2 to 3 years.   *  History of dysphagia with esophageal dilatations in past.  Not recently bothered by dysphagia.  *  IDDM *  Rheumatoid arthritis on chronic Prednisone and Arava.  *  Chronic Hydrocodone for musc/skeletal pain.   PLAN: *  Needs colonoscopy, set for 1 PM tomorrow *  CBC in AM.   LOS: 1 day   Sarah Gribbin  01/05/2013, 12:54 PM Pager: [phone]      Attending physician's note   I have taken a history, examined the patient and reviewed the chart. I agree with the Advanced Practitioner's note, impression and recommendations. Chronic anemia. Hematochezia likely from large hemorrhoids. History of adenomatous colon polyps and overdue for colonoscopy. R/O colorectal neoplasms. Colonoscopy tomorrow.  Quantez Schnyder T Basir Niven, MD FACG  

## 2013-01-05 NOTE — Progress Notes (Signed)
Inpatient Diabetes Program Recommendations  AACE/ADA: New Consensus Statement on Inpatient Glycemic Control (2013)  Target Ranges:  Prepandial:   less than 140 mg/dL      Peak postprandial:   less than 180 mg/dL (1-2 hours)      Critically ill patients:  140 - 180 mg/dL     **Noted morning dose of Levemir (Levemir 10 units) was held this morning due to patient being NPO.  Patient now on clear liquid diet.  No hold parameters on Levemir to hold this morning.  Called RN to discuss.  Recommended to RN to call MD (Dr. David Stall) to get order to go ahead and give Levemir 10 units.  Will follow. Ambrose Finland RN, MSN, CDE Diabetes Coordinator Inpatient Diabetes Program Team Pager: [phone] (8a-10p)

## 2013-01-05 NOTE — Progress Notes (Signed)
pts systolic pressure elevated, no c/o headaches nor blurring of vision nor chest pains.. Blood infusion in progress. New iv site inserted for Labetalol to be given. Continued to observe pt closely

## 2013-01-05 NOTE — Progress Notes (Signed)
Utilization Review Completed Nikolaj Geraghty J. Amrit Cress, RN, BSN, NCM [phone]  

## 2013-01-05 NOTE — Progress Notes (Signed)
Pt together with husband refused Levimir since "she took already in am, and she is nothing by mouth after 12 mn" Notified Maren Reamer pt refused levimir and was given nightime novolog insulin coverage. Will recheck blood sugar at 4am

## 2013-01-05 NOTE — Progress Notes (Addendum)
[facility] PROGRESS NOTE Assessment/Plan: Anemia/melena: - s/p 2 units transfusion. Hbg >8.0 cont CBC q12hrs. - consult GI she never followed with Dr. Anselm Jungling as an outpatient. - clear liq diet. - last colonoscopy > 5 year ago.  DM 2: -  Cont levemir and SSI.  HYPERTENSION: - resume home meds. - Hydralazine.   Rheumatoid arthritis(714.0) - follow up with PCP as an outpatient.    Code Status: full Family Communication: husband  Disposition Plan: inpatient   Consultants:  GI   Procedures:  Colonoscopy  Antibiotics:  None  HPI/Subjective: Feels btter.  Objective: Filed Vitals:   01/05/13 0445 01/05/13 0545 01/05/13 0645 01/05/13 0934  BP: 170/89 179/85 168/86 170/73  Pulse: 97 105 115 108  Temp: 98.6 F (37 C) 98.2 F (36.8 C) 98.2 F (36.8 C) 98.6 F (37 C)  TempSrc: Oral Oral Oral Oral  Resp: 16 15 16 20   Height:      Weight:      SpO2:        Intake/Output Summary (Last 24 hours) at 01/05/13 1219 Last data filed at 01/05/13 0940  Gross per 24 hour  Intake  747.5 ml  Output   1000 ml  Net -252.5 ml   Filed Weights   01/04/13 2302  Weight: 58.196 kg (128 lb 4.8 oz)    Exam:  General: Alert, awake, oriented x3, in no acute distress.  HEENT: No bruits, no goiter.  Heart: Regular rate and rhythm, without murmurs, rubs, gallops.  Lungs: Good air movement, bilateral air movement.  Abdomen: Soft, nontender, nondistended, positive bowel sounds.    Data Reviewed: Basic Metabolic Panel:  Recent Labs Lab 01/04/13 1939 01/05/13 0835  NA 132* 137  K 5.5* 3.8  CL 99 98  CO2  --  29  GLUCOSE 270* 156*  BUN 15 14  CREATININE 1.20* 1.00  CALCIUM  --  8.6   Liver Function Tests:  Recent Labs Lab 01/05/13 0835  AST 11  ALT 8  ALKPHOS 89  BILITOT 0.9  PROT 5.8*  ALBUMIN 2.9*   No results found for this basename: LIPASE, AMYLASE,  in the last 168 hours No results found for this basename: AMMONIA,  in the last 168  hours CBC:  Recent Labs Lab 01/04/13 1930 01/04/13 1939 01/05/13 0835  WBC 8.9  --  8.4  NEUTROABS 8.4*  --  6.1  HGB 4.6* 5.1* 8.0*  HCT 14.8* 15.0* 24.4*  MCV 83.1  --  81.3  PLT 223  --  183   Cardiac Enzymes: No results found for this basename: CKTOTAL, CKMB, CKMBINDEX, TROPONINI,  in the last 168 hours BNP (last 3 results)  Recent Labs  06/06/12 1000  PROBNP 405.9*   CBG:  Recent Labs Lab 01/05/13 0018 01/05/13 0400 01/05/13 0621 01/05/13 1131  GLUCAP 440* 162* 191* 178*    Recent Results (from the past 240 hour(s))  MRSA PCR SCREENING     Status: None   Collection Time    01/04/13 11:09 PM      Result Value Range Status   MRSA by PCR NEGATIVE  NEGATIVE Final   Comment:            The GeneXpert MRSA Assay (FDA     approved for NASAL specimens     only), is one component of a     comprehensive MRSA colonization     surveillance program. It is not     intended to diagnose MRSA     infection  nor to guide or     monitor treatment for     MRSA infections.     Studies: No results found.  Scheduled Meds: . folic acid  1 mg Oral Daily  . insulin aspart  0-5 Units Subcutaneous QHS  . insulin aspart  0-9 Units Subcutaneous TID WC  . insulin detemir  10 Units Subcutaneous Q1200  . leflunomide  20 mg Oral Daily  . lisinopril  20 mg Oral Daily  . LORazepam  0.5 mg Oral Q8H  . metoprolol succinate  100 mg Oral Daily  . pantoprazole (PROTONIX) IV  40 mg Intravenous Q12H  . predniSONE  10 mg Oral Daily  . pregabalin  150 mg Oral QHS  . pregabalin  75 mg Oral Daily  . sodium chloride  3 mL Intravenous Q12H   Continuous Infusions:    Marinda Elk  [facility] Pager [phone]. If 8PM-8AM, please contact night-coverage at [email or web address], password [facility] 01/05/2013, 12:19 PM  LOS: 1 day

## 2013-01-06 ENCOUNTER — Encounter (HOSPITAL_COMMUNITY)
Admission: EM | Disposition: A | Discharge status: Home / Self Care | Payer: Self-pay | Source: Home / Self Care | Attending: Internal Medicine

## 2013-01-06 ENCOUNTER — Encounter (HOSPITAL_COMMUNITY): Payer: Self-pay | Admitting: Gastroenterology

## 2013-01-06 DIAGNOSIS — K648 Other hemorrhoids: Secondary | ICD-10-CM | POA: Diagnosis not present

## 2013-01-06 DIAGNOSIS — Z86010 Personal history of colonic polyps: Secondary | ICD-10-CM | POA: Diagnosis not present

## 2013-01-06 DIAGNOSIS — K921 Melena: Secondary | ICD-10-CM | POA: Diagnosis not present

## 2013-01-06 DIAGNOSIS — D649 Anemia, unspecified: Secondary | ICD-10-CM | POA: Diagnosis not present

## 2013-01-06 HISTORY — PX: COLONOSCOPY: SHX5424

## 2013-01-06 LAB — GLUCOSE, CAPILLARY
Glucose-Capillary: 110 mg/dL — ABNORMAL HIGH (ref 70–99)
Glucose-Capillary: 99 mg/dL (ref 70–99)
Glucose-Capillary: 114 mg/dL — ABNORMAL HIGH (ref 70–99)
Glucose-Capillary: 216 mg/dL — ABNORMAL HIGH (ref 70–99)
Glucose-Capillary: 289 mg/dL — ABNORMAL HIGH (ref 70–99)

## 2013-01-06 LAB — CBC
Hemoglobin: 8.4 g/dL — ABNORMAL LOW (ref 12.0–15.0)
MCH: 26.4 pg (ref 26.0–34.0)
MCHC: 32.1 g/dL (ref 30.0–36.0)
MCV: 82.4 fL (ref 78.0–100.0)
Platelets: 181 10*3/uL (ref 150–400)

## 2013-01-06 SURGERY — COLONOSCOPY
Anesthesia: Moderate Sedation

## 2013-01-06 MED ORDER — FENTANYL CITRATE 0.05 MG/ML IJ SOLN
INTRAMUSCULAR | Status: DC | PRN
  Administered 2013-01-06 (×3): 25 ug via INTRAVENOUS

## 2013-01-06 MED ORDER — MIDAZOLAM HCL 5 MG/5ML IJ SOLN
INTRAMUSCULAR | Status: DC | PRN
  Administered 2013-01-06 (×2): 2 mg via INTRAVENOUS
  Administered 2013-01-06 (×2): 1 mg via INTRAVENOUS

## 2013-01-06 MED ORDER — DIPHENHYDRAMINE HCL 50 MG/ML IJ SOLN
INTRAMUSCULAR | Status: AC
  Filled 2013-01-06: qty 1

## 2013-01-06 MED ORDER — DEXTROSE 50 % IV SOLN
INTRAVENOUS | Status: AC
  Administered 2013-01-06: 50 mL
  Administered 2013-01-06: 07:00:00
  Administered 2013-01-06: 25 mL
  Filled 2013-01-06: qty 50

## 2013-01-06 MED ORDER — DEXTROSE 50 % IV SOLN
INTRAVENOUS | Status: AC
  Filled 2013-01-06: qty 50

## 2013-01-06 MED ORDER — FENTANYL CITRATE 0.05 MG/ML IJ SOLN
INTRAMUSCULAR | Status: AC
  Filled 2013-01-06: qty 4

## 2013-01-06 MED ORDER — MIDAZOLAM HCL 5 MG/ML IJ SOLN
INTRAMUSCULAR | Status: AC
  Filled 2013-01-06: qty 2

## 2013-01-06 NOTE — Clinical Documentation Improvement (Signed)
THIS DOCUMENT IS NOT A PERMANENT PART OF THE MEDICAL RECORD  Please update your documentation with the medical record to reflect your response to this query. If you need help knowing how to do this please call [phone].  01/06/13   Dr. David Stall,  In a better effort to capture your patient's severity of illness, reflect appropriate length of stay and utilization of resources, a review of the patient medical record has revealed the following information:    - Patient advised to come to ED secondary to lab work showing worsening anemia   - Colonoscopy done 01/06/13 showing diverticulosis and friable internal hemorrhoids   - Patient received 2 units of packed cells this admission    - Serial CBC's this admission    Based on your clinical judgment and after careful study: please document the possible, probable, suspected, or known cause of the anemia in the progress notes and discharge summary.  Please also include the ACUITY and TYPE of Anemia in the progress notes and discharge summary.   Please note that Cause and Effect relationships cannot be assumed according to [facility] Guidelines.  You may use Possible, Probable, or Suspected with inpatient documentation.   Possible, Probable, or Suspected diagnoses MUST be documented as such at the time of discharge unless confirmed or ruled out during the admission.   In responding to this query please exercise your independent judgment.    The fact that a query is asked, does not imply that any particular answer is desired or expected.   Reviewed: 01/11/13 -  "Iron deficiency Anemia/ hematochezia. -Secondary to lower GI bleed." documented by Dr. Catha Gosselin pn 01/06/13.  Mathis Dad    Thank You,  Jerral Ralph  RN BSN CCDS Certified Clinical Documentation Specialist: [phone] Health Information Management [place]

## 2013-01-06 NOTE — Op Note (Signed)
[facility] [address] [place], [address]   COLONOSCOPY PROCEDURE REPORT  PATIENT: Marisa [name], Marisa [name]  MR#: [id number] BIRTHDATE: 12/21/1940 , 71  yrs. old GENDER: Female ENDOSCOPIST: [name], MD, [facility] PROCEDURE DATE:  01/06/2013 PROCEDURE:   Colonoscopy, diagnostic First Screening Colonoscopy - Avg.  risk and is 50 yrs.  old or older - No.  Prior Negative Screening - Now for repeat screening. N/A  History of Adenoma - Now for follow-up colonoscopy & has been > or = to 3 yrs.  Yes hx of adenoma.  Has been 3 or more years since last colonoscopy.  Polyps Removed Today? No.  Recommend repeat exam, <10 yrs? No. ASA CLASS:   Class III INDICATIONS:Patient's personal history of adenomatous colon polyps and hematochezia. MEDICATIONS: medications were titrated to patient response per physician's verbal order, Fentanyl 75 mcg IV, and Versed 6 mg IV DESCRIPTION OF PROCEDURE:   After the risks benefits and alternatives of the procedure were thoroughly explained, informed consent was obtained.  A digital rectal exam revealed moderate external hemorrhoids.   The Pentax Ped Colon [id number]  endoscope was introduced through the anus and advanced to the cecum, which was identified by both the appendix and ileocecal valve. No adverse events experienced.   The quality of the prep was good, using MoviPrep  The instrument was then slowly withdrawn as the colon was fully examined.  COLON FINDINGS: Mild diverticulosis was noted in the sigmoid colon. The colon was otherwise normal.  There was no diverticulosis, inflammation, polyps or cancers unless previously stated. Retroflexed views revealed moderate, friable internal hemorrhoids. The time to cecum=2 minutes 30 seconds.  Withdrawal time=9 minutes 30 seconds.  The scope was withdrawn and the procedure completed. COMPLICATIONS: There were no complications.  ENDOSCOPIC IMPRESSION: 1.   Mild diverticulosis was  noted in the sigmoid colon 2.   Moderate internal and external hemorrrhoids  RECOMMENDATIONS: 1.  High fiber diet with liberal fluid intake. 2.  Given your age, comordities and absence of polyps on this exam you will not need another colonoscopy for colon cancer screening or polyp surveillance. 3.  Treat hemorrhoids with HC supp and HC creams. If symptoms persist proceed with surgical referral  eSigned:  [name], MD, [facility] 01/06/2013 1:55 PM   cc: Juline Patch, MD

## 2013-01-06 NOTE — Interval H&P Note (Signed)
History and Physical Interval Note:  01/06/2013 1:20 PM  Marisa [name]  has presented today for surgery, with the diagnosis of rectal bleeding  The various methods of treatment have been discussed with the patient and family. After consideration of risks, benefits and other options for treatment, the patient has consented to  Procedure(s): COLONOSCOPY (N/A) as a surgical intervention .  The patient's history has been reviewed, patient examined, no change in status, stable for surgery.  I have reviewed the patient's chart and labs.  Questions were answered to the patient's satisfaction.     Venita Lick. Russella Dar MD [facility] Graham

## 2013-01-06 NOTE — H&P (View-Only) (Signed)
[facility] Consult: 12:54 PM 01/05/2013   Referring Provider: Dr Robb Matar, [facility]. Primary Care Physician:  Juline Patch, MD Primary Gastroenterologist:  Dr. Russella Dar, previously Dr Virginia Rochester  Reason for Consultation:  Hematochezia.   HPI: Marisa [name] is a 72 y.o. female.  Hx RA treated with chronic prednisone and Arava. Type 2 IDDM.  Anemia of chronic disease.  Hx IBS-C, esophageal stricture, adenomatous colon polyp in 2005, has not had follow up colonoscopy. Takes Nexium BID. Has chronic constipation.  Stool surrounded by blood has been issue for at least one year. Was occurring about once per month.  In last 4 weeks occurrences of 2 x weekly, whenever she had a stool.  Blood mostly BRB, sometime darker/marroon.  Blood generally separate from the otherwise brown stool.  There is sense of painful pressure in rectum.  Using a yet to be named prescription hemorrhoidal cream which provides better control of rectal pain than does the Preparation H. No abdominal pain, no nausea, no dysphagia. No NSAIDs but does use 6 Hydrocodone 5/325 daily.  Transfused 2 units PRBCs overnight.  Hgb up to 8.0 from 4.6 yesterday.  Comp of 11.2 in Feb 2014, 9.4 in 02/2012.    Past Medical History  Diagnosis Date  . Diabetic ketoacidosis 11/2010  . Myocardial infarction     Chest pain s/p normal cath in 08/2004 then NSTEMI during 11/2010 admission, negative Myoview  . Diabetes mellitus type 2, insulin dependent     initial diagnoses 11/2008  . GERD (gastroesophageal reflux disease)   . Esophageal stricture 06/2004    Dilation 06/2004  . Hypertension   . Supraventricular tachycardia   . history of  pericarditis 12/2002  . Pericardial effusion   . Rheumatoid arthritis(714.0)     On MTX and chronic steroids  . Osteoarthritis   . Asthma     Home 3L O2   . Seizure 11/2008  . Rectal bleeding 11/2010    Large hemorrhoids  . Chronic anemia   . History of blood  transfusion     Past Surgical History  Procedure Laterality Date  . Cholecystectomy    . Abdominal hysterectomy    . Incise and drain abcess  09/2011    I&D of peri-rectal abcess per Dr Abbey Chatters.     Prior to Admission medications   Medication Sig Start Date End Date Taking? Authorizing Provider  aspirin EC 81 MG tablet Take 81 mg by mouth daily.   Yes Historical Provider, MD  Cholecalciferol (VITAMIN D) 2000 UNITS CAPS Take 2,000 capsules by mouth daily.   Yes Historical Provider, MD  Coenzyme Q10 (CO Q 10) 100 MG CAPS Take 100 mg by mouth daily.   Yes Historical Provider, MD  docusate sodium (COLACE) 100 MG capsule Take 1 capsule (100 mg total) by mouth daily as needed for constipation. Daily as needed 10/08/11  Yes Nishant Dhungel, MD  esomeprazole (NEXIUM) 40 MG capsule Take 40 mg by mouth 2 (two) times daily.    Yes Historical Provider, MD  estrogens, conjugated, (PREMARIN) 0.625 MG tablet Take 0.625 mg by mouth daily.    Yes Historical Provider, MD  FeFum-FePoly-FA-B Cmp-C-Biot (INTEGRA PLUS) CAPS Take 1 capsule by mouth daily. Take 1 cap daily. 02/27/12  Yes Marinda Elk, MD  folic acid (FOLVITE) 1 MG tablet Take 1 mg by mouth daily.     Yes Historical Provider, MD  HYDROcodone-acetaminophen (NORCO/VICODIN) 5-325 MG per tablet Take 2 tablets by mouth every 6 (six) hours as needed for pain.  Yes Historical Provider, MD  insulin aspart (NOVOLOG FLEXPEN) 100 UNIT/ML injection Inject 3-12 Units into the skin 3 (three) times daily before meals. Sliding scale   Yes Historical Provider, MD  insulin detemir (LEVEMIR FLEXPEN) 100 UNIT/ML injection Inject 22 Units into the skin daily at 12 noon.    Yes Historical Provider, MD  leflunomide (ARAVA) 20 MG tablet Take 20 mg by mouth daily.   Yes Historical Provider, MD  levalbuterol [facility] HFA) 45 MCG/ACT inhaler Inhale 1-2 puffs into the lungs every 4 (four) hours as needed for wheezing.   Yes Historical Provider, MD  lisinopril  (PRINIVIL,ZESTRIL) 20 MG tablet Take 20 mg by mouth daily.   Yes Historical Provider, MD  LORazepam (ATIVAN) 0.5 MG tablet Take 0.5 mg by mouth every 8 (eight) hours.   Yes Historical Provider, MD  metoprolol (TOPROL XL) 100 MG 24 hr tablet Take 100 mg by mouth daily.    Yes Historical Provider, MD  Multiple Vitamin (MULTIVITAMINS PO) Take 1 tablet by mouth. Daily.    Yes Historical Provider, MD  phenylephrine-shark liver oil-mineral oil-petrolatum (PREPARATION H) 0.25-3-14-71.9 % rectal ointment Place 1 application rectally 2 (two) times daily as needed for hemorrhoids.   Yes Historical Provider, MD  potassium chloride SA (K-DUR,KLOR-CON) 20 MEQ tablet Take 20 mEq by mouth 2 (two) times daily.    Yes Historical Provider, MD  predniSONE (DELTASONE) 10 MG tablet Take 10 mg by mouth daily.   Yes Historical Provider, MD  pregabalin (LYRICA) 75 MG capsule Take 75 mg by mouth 2 (two) times daily. 1 IN THE AM AND 2 IN THE PM   Yes Historical Provider, MD  vitamin A 8000 UNIT capsule Take 8,000 Units by mouth daily.   Yes Historical Provider, MD  albuterol (PROVENTIL) (5 MG/ML) 0.5% nebulizer solution Take 0.5 mLs (2.5 mg total) by nebulization every 6 (six) hours as needed for wheezing or shortness of breath. 10/08/11 10/07/12  Nishant Dhungel, MD    Scheduled Meds: . folic acid  1 mg Oral Daily  . insulin aspart  0-5 Units Subcutaneous QHS  . insulin aspart  0-9 Units Subcutaneous TID WC  . insulin detemir  10 Units Subcutaneous Q1200  . leflunomide  20 mg Oral Daily  . lisinopril  20 mg Oral Daily  . LORazepam  0.5 mg Oral Q8H  . metoprolol succinate  100 mg Oral Daily  . pantoprazole (PROTONIX) IV  40 mg Intravenous Q12H  . predniSONE  10 mg Oral Daily  . pregabalin  150 mg Oral QHS  . pregabalin  75 mg Oral Daily  . sodium chloride  3 mL Intravenous Q12H   Infusions:   PRN Meds: albuterol, hydrALAZINE, labetalol, levalbuterol, ondansetron (ZOFRAN) IV, ondansetron   Allergies as of  01/04/2013 - Review Complete 01/04/2013  Allergen Reaction Noted  . Demerol [meperidine] Other (See Comments) 09/29/2011  . Penicillins Other (See Comments) 09/29/2011    History reviewed. No pertinent family history.  History   Social History  . Marital Status: Married    Spouse Name: N/A    Number of Children: N/A  . Years of Education: N/A   Occupational History  . Not on file.   Social History Main Topics  . Smoking status: Never Smoker   . Smokeless tobacco: Never Used  . Alcohol Use: No  . Drug Use: No  . Sexual Activity: Not on file   Other Topics Concern  . Not on file   Social History Narrative   Pt lives  at home with her husband and has a son and daughter. She is still ambulatory occasionally using a cane or walker. No toxic habits.     REVIEW OF SYSTEMS: Constitutional:  Generally increased fatigue ENT:  No nose bleeds Pulm:  Recent Zithromax for URI, completed 9/19.  No cough at present.   CV:  No chest pain, no palpitions but does feel rapid rate occasionally.  No pedal edema GU:  Frequency and urgency from "old age" bladder issues GI:  Per HPI Heme:  Tolerates po Iron, no recall of treatment with parenteral iron.    Transfusions:  yes Neuro:  No dizziness.  Balance is compromised.  Uses combo of cane, walker and wheelchair Derm:  Some pruritic dry skin along upper sternum.  No sores.  Generally dry skin. Endocrine:  No excessive thirst or urination. No sweats.  Sugars range 120s-170s but as high as 360 a few days ago. Immunization:  No flu shot yet, prefers to get this when she is feeling better.  Travel:  none   PHYSICAL EXAM: Vital signs in last 24 hours: Temp:  [97.6 F (36.4 C)-99 F (37.2 C)] 98.6 F (37 C) (09/23 0934) Pulse Rate:  [91-116] 108 (09/23 0934) Resp:  [15-23] 20 (09/23 0934) BP: (156-185)/(52-153) 170/73 mmHg (09/23 0934) SpO2:  [96 %-100 %] 96 % (09/22 2315) Weight:  [58.196 kg (128 lb 4.8 oz)] 58.196 kg (128 lb 4.8 oz)  (09/22 2302)  General: pleasant, somewhat chronically ill appearing WF.   Head:  No asymmetry or facial edema  Eyes:  No icterus, EOMI, slight conjunctival pallor Ears:  Not HOH  Nose:  Slight blood from nasal passages on occasion.  No epistaxis of any magnitude Mouth:  Clear, moist. Neck:  No TMG or JVD.  No bruit Lungs:  Clear bil.  repirations quiet, unlabored, even. Heart: slightly tachy, regular.  No MRG Abdomen:  Soft, no mass or tenderness,  No bruits.  No HSM.  No hernia.   Rectal: large, non-thrombosed hemorrhoids, cauliflower-like.    Musc/Skeltl: rheumatoid digital deviation in fingers.  Kyphosis.  Extremities:  No pedal edema  Neurologic:  No tremor.  Oriented x 3.  Excellent recall of details.  Skin:  No sores, no telangectasia.  Some purpura on arms and legs Tattoos:  none Nodes:  No cervical or inguinal adenopathy.    Psych:  Pleasant, cooperative, in good spirits.   Intake/Output from previous day: 09/22 0701 - 09/23 0700 In: 744.5 [I.V.:100; Blood:644.5] Out: 500 [Urine:500] Intake/Output this shift: Total I/O In: 3 [I.V.:3] Out: 500 [Urine:500]  LAB RESULTS:  Recent Labs  01/04/13 1930 01/04/13 1939 01/05/13 0835  WBC 8.9  --  8.4  HGB 4.6* 5.1* 8.0*  HCT 14.8* 15.0* 24.4*  PLT 223  --  183   BMET Lab Results  Component Value Date   NA 137 01/05/2013   NA 132* 01/04/2013   NA 129* 06/06/2012   K 3.8 01/05/2013   K 5.5* 01/04/2013   K 3.6 06/06/2012   CL 98 01/05/2013   CL 99 01/04/2013   CL 93* 06/06/2012   CO2 29 01/05/2013   CO2 23 06/06/2012   CO2 25 02/27/2012   GLUCOSE 156* 01/05/2013   GLUCOSE 270* 01/04/2013   GLUCOSE 255* 06/06/2012   BUN 14 01/05/2013   BUN 15 01/04/2013   BUN 9 06/06/2012   CREATININE 1.00 01/05/2013   CREATININE 1.20* 01/04/2013   CREATININE 0.88 06/06/2012   CALCIUM 8.6 01/05/2013   CALCIUM 8.3*  06/06/2012   CALCIUM 8.1* 02/27/2012   LFT  Recent Labs  01/05/13 0835  PROT 5.8*  ALBUMIN 2.9*  AST 11  ALT 8  ALKPHOS  89  BILITOT 0.9   PT/INR Lab Results  Component Value Date   INR 0.90 01/05/2013   INR 0.88 01/04/2013     RADIOLOGY STUDIES: No results found.  ENDOSCOPIC STUDIES: 02/2007  EGD  For abdominal pain, dysphagia. No esophageal stricture but was empirically dilated. Gastritis noted. Pathology:  Mild chronic gastritis.  No H Pylori present.      06/2004  EGD  For dysphagia. Esophageal stricture dilated  09/2003  Colonoscopy Single adenomatous polyp and internal hemorrhoids. Due for repeat colon 09/2008.   04/1991 ERCP  normal post cholecystectomy cholangiogram and a normal ampulla.  Air bubble in CBD   IMPRESSION: *  Hematochezia.  Present for several months but accelerating occurences in last 4 weeks.  Known large hemorrhoids. Overdue for follow up adenomatous polyp monitoring; index polyp was 09/2003. *  Acute on chronic anemia. Takes po iron, folic acid daily.   S/p excellent response to 2 units PRBCs  *  Chronic constipation.  Chronic narcotics and daily po Iron contributing to this.  *  Progressive, gradual weight loss over 2 to 3 years.   *  History of dysphagia with esophageal dilatations in past.  Not recently bothered by dysphagia.  *  IDDM *  Rheumatoid arthritis on chronic Prednisone and Arava.  *  Chronic Hydrocodone for musc/skeletal pain.   PLAN: *  Needs colonoscopy, set for 1 PM tomorrow *  CBC in AM.   LOS: 1 day   Jennye Moccasin  01/05/2013, 12:54 PM Pager: [phone]      Attending physician's note   I have taken a history, examined the patient and reviewed the chart. I agree with the Advanced Practitioner's note, impression and recommendations. Chronic anemia. Hematochezia likely from large hemorrhoids. History of adenomatous colon polyps and overdue for colonoscopy. R/O colorectal neoplasms. Colonoscopy tomorrow.  [name], MD [facility]

## 2013-01-06 NOTE — Progress Notes (Signed)
pts CBG for 11am was 60, pt felt weak, c/o HA, and started to have blurred vision (which she states is how she feels hen her sugar drops), pt NPO, 25ml of D50 given, CBG came up to 99, pt stable, NS @ 20/hr,

## 2013-01-06 NOTE — Progress Notes (Signed)
[facility]                                                                                Patient Demographics  Marisa [name], is a 72 y.o. female, DOB - [birth year], ZOX:[id number]  Admit date - 01/04/2013   Admitting Physician Lynden Oxford, MD  Outpatient Primary MD for the patient is Juline Patch, MD  LOS - 2   Chief Complaint  Patient presents with  . Anemia  . Abnormal Lab        Assessment & Plan  1.  Iron deficiency Anemia/ hematochezia.  -Secondary to lower GI bleed.    -Patient received 2 units of red blood cells upon admission hemoglobin is approximately 80.   -Obtain a CBC in the morning to trend.  -Patient is scheduled for colonoscopy today.  2.  HYPERTENSION  -Currently stable continue home medications of hydralazine.  3.  HEMORRHOIDS, INTERNAL   4.  Rheumatoid arthritis, stable  5.  Diabetes mellitus type 2, insulin dependent  Continue Levemir and insulin sliding scale.   Code Status: Full  Family Communication: Husband at bedside  Disposition Plan: Admitted.  May discharge patient in the morning if she can tolerated a diet.   Procedures Colonoscopy.   Consults  GI   DVT Prophylaxis  SCDs    Lab Results  Component Value Date   PLT 181 01/06/2013    Medications  Scheduled Meds: . dextrose      . folic acid  1 mg Oral Daily  . insulin aspart  0-5 Units Subcutaneous QHS  . insulin aspart  0-9 Units Subcutaneous TID WC  . insulin detemir  20 Units Subcutaneous Q1200  . leflunomide  20 mg Oral Daily  . lisinopril  20 mg Oral Daily  . LORazepam  0.5 mg Oral Q8H  . metoprolol succinate  100 mg Oral Daily  . pantoprazole (PROTONIX) IV  40 mg Intravenous Q12H  . predniSONE  10 mg Oral Daily  . pregabalin  150 mg Oral QHS  . pregabalin  75 mg Oral Daily  . sodium chloride  3 mL Intravenous Q12H   Continuous Infusions: . sodium chloride 20 mL/hr at 01/05/13 1728   PRN Meds:.albuterol, hydrALAZINE, labetalol, levalbuterol,  ondansetron (ZOFRAN) IV, ondansetron  Antibiotics   Anti-infectives   None       Time Spent in minutes   30 minutes   Bracha Frankowski D.O. on 01/06/2013 at 5:03 PM  Between 7am to 7pm - Pager - [phone]  After 7pm go to [email or web address] - password [email or web address]  And look for the night coverage person covering for me after hours  [facility] Office  [phone]    Subjective:   Marisa [name] today has, No headache, No chest pain, No abdominal pain - No Nausea, No new weakness tingling or numbness, No Cough - SOB. Continues to see right bleed blood per rectum.  Denies dizziness.   Objective:   Filed Vitals:   01/06/13 1410 01/06/13 1420 01/06/13 1430 01/06/13 1500  BP: 133/79 140/75 131/61 139/81  Pulse:  125 106 124  Temp:      TempSrc:      Resp: 23 19 19  20  Height:      Weight:      SpO2:  100% 100% 100%    Wt Readings from Last 3 Encounters:  01/06/13 55.702 kg (122 lb 12.8 oz)  01/06/13 55.702 kg (122 lb 12.8 oz)  06/06/12 58.06 kg (128 lb)     Intake/Output Summary (Last 24 hours) at 01/06/13 1703 Last data filed at 01/06/13 1158  Gross per 24 hour  Intake   1080 ml  Output    900 ml  Net    180 ml    Exam General: Awake Alert, Oriented X 3, No new F.N deficits, Normal affect  HEENT: [place]/AT,PERRAL  Neck: Supple Neck,No JVD, No cervical lymphadenopathy appriciated.  Respiratory: Symmetrical Chest wall movement, Good air movement bilaterally, CTAB  Cardiovascular: RRR, No Gallops,Rubs or new Murmurs  Abdomen: Soft, nontender, nondistended, positive bowel sounds.  Extremities: No Cyanosis, Clubbing, edema. Neuro: Grossly intact, nonfocal.    Data Review   Micro Results Recent Results (from the past 240 hour(s))  MRSA PCR SCREENING     Status: None   Collection Time    01/04/13 11:09 PM      Result Value Range Status   MRSA by PCR NEGATIVE  NEGATIVE Final   Comment:            The GeneXpert MRSA Assay (FDA     approved for NASAL  specimens     only), is one component of a     comprehensive MRSA colonization     surveillance program. It is not     intended to diagnose MRSA     infection nor to guide or     monitor treatment for     MRSA infections.    Radiology Reports No results found.  CBC  Recent Labs Lab 01/04/13 1930 01/04/13 1939 01/05/13 0835 01/06/13 0842  WBC 8.9  --  8.4 7.3  HGB 4.6* 5.1* 8.0* 8.4*  HCT 14.8* 15.0* 24.4* 26.2*  PLT 223  --  183 181  MCV 83.1  --  81.3 82.4  MCH 25.8*  --  26.7 26.4  MCHC 31.1  --  32.8 32.1  RDW 15.8*  --  15.1 15.9*  LYMPHSABS 0.2*  --  1.2  --   MONOABS 0.2  --  1.1*  --   EOSABS 0.0  --  0.1  --   BASOSABS 0.0  --  0.0  --     Chemistries   Recent Labs Lab 01/04/13 1939 01/05/13 0835  NA 132* 137  K 5.5* 3.8  CL 99 98  CO2  --  29  GLUCOSE 270* 156*  BUN 15 14  CREATININE 1.20* 1.00  CALCIUM  --  8.6  AST  --  11  ALT  --  8  ALKPHOS  --  89  BILITOT  --  0.9   ------------------------------------------------------------------------------------------------------------------ estimated creatinine clearance is 37 ml/min (by C-G formula based on Cr of 1). ------------------------------------------------------------------------------------------------------------------ No results found for this basename: HGBA1C,  in the last 72 hours ------------------------------------------------------------------------------------------------------------------ No results found for this basename: CHOL, HDL, LDLCALC, TRIG, CHOLHDL, LDLDIRECT,  in the last 72 hours ------------------------------------------------------------------------------------------------------------------ No results found for this basename: TSH, T4TOTAL, FREET3, T3FREE, THYROIDAB,  in the last 72 hours ------------------------------------------------------------------------------------------------------------------ No results found for this basename: VITAMINB12, FOLATE, FERRITIN,  TIBC, IRON, RETICCTPCT,  in the last 72 hours  Coagulation profile  Recent Labs Lab 01/04/13 2206 01/05/13 0835  INR 0.88 0.90    No results found for this  basename: DDIMER,  in the last 72 hours  Cardiac Enzymes No results found for this basename: CK, CKMB, TROPONINI, MYOGLOBIN,  in the last 168 hours ------------------------------------------------------------------------------------------------------------------ No components found with this basename: POCBNP,

## 2013-01-06 NOTE — Progress Notes (Signed)
Patient evaluated for community based chronic disease management services with [facility] Care Management Program as a benefit of patient's [employer]. Patient will receive a post discharge transition of care call and will be evaluated for monthly home visits for assessments and diabetes disease process education. Spoke with patient at bedside to explain [facility] Care Management services.  Patients admits that she is dietary nonadherent at home and needs education for her and her spouse.  Consents obtained.  Left contact information and [facility] literature at bedside. Made inpatient Case Manager aware that [facility] Care Management following. Of note, [facility] Care Management services does not replace or interfere with any services that are arranged by inpatient case management or social work.  For additional questions or referrals please contact Anibal Henderson BSN RN [facility] Liaison at [phone].

## 2013-01-07 ENCOUNTER — Encounter (HOSPITAL_COMMUNITY): Payer: Self-pay | Admitting: Gastroenterology

## 2013-01-07 DIAGNOSIS — D649 Anemia, unspecified: Secondary | ICD-10-CM | POA: Diagnosis not present

## 2013-01-07 LAB — BASIC METABOLIC PANEL
BUN: 14 mg/dL (ref 6–23)
CO2: 26 mEq/L (ref 19–32)
Chloride: 99 mEq/L (ref 96–112)
GFR calc Af Amer: 45 mL/min — ABNORMAL LOW (ref >90)
GFR calc non Af Amer: 39 mL/min — ABNORMAL LOW (ref >90)
Potassium: 3.6 mEq/L (ref 3.5–5.1)

## 2013-01-07 LAB — GLUCOSE, CAPILLARY
Glucose-Capillary: 189 mg/dL — ABNORMAL HIGH (ref 70–99)
Glucose-Capillary: 319 mg/dL — ABNORMAL HIGH (ref 70–99)
Glucose-Capillary: 157 mg/dL — ABNORMAL HIGH (ref 70–99)

## 2013-01-07 LAB — CBC
HCT: 25.7 % — ABNORMAL LOW (ref 36.0–46.0)
MCHC: 31.9 g/dL (ref 30.0–36.0)
RBC: 3.07 MIL/uL — ABNORMAL LOW (ref 3.87–5.11)
RDW: 16.2 % — ABNORMAL HIGH (ref 11.5–15.5)

## 2013-01-07 MED ORDER — PANTOPRAZOLE SODIUM 40 MG PO TBEC
40.0000 mg | DELAYED_RELEASE_TABLET | Freq: Every day | ORAL | Status: DC
  Administered 2013-01-07: 10:00:00 40 mg via ORAL
  Filled 2013-01-07: qty 1

## 2013-01-07 MED ORDER — TRAMADOL HCL 50 MG PO TABS
50.0000 mg | ORAL_TABLET | Freq: Four times a day (QID) | ORAL | Status: DC | PRN
  Administered 2013-01-07: 50 mg via ORAL
  Filled 2013-01-07: qty 1

## 2013-01-07 MED ORDER — PE-SHARK LIVER OIL-COCOA BUTTR 0.25-3-85.5 % RE SUPP: 1.0000 | RECTAL | Status: DC | PRN

## 2013-01-07 MED ORDER — ACETAMINOPHEN 325 MG PO TABS: 650.0000 mg | ORAL_TABLET | Freq: Four times a day (QID) | ORAL | Status: DC | PRN

## 2013-01-07 NOTE — Progress Notes (Signed)
Pt being d/c to home with husband, d/c instructions given to pt, medications and follow up appointments given to pt, pt verbalized understating, pt leaving via wheelchair with O2, pt stable

## 2013-01-07 NOTE — ED Provider Notes (Signed)
Medical screening examination/treatment/procedure(s) were performed by non-physician practitioner and as supervising physician I was immediately available for consultation/collaboration.  Beata Beason, MD 01/07/13 0609 

## 2013-01-07 NOTE — Discharge Summary (Signed)
Physician Discharge Summary  Marisa [name] NFA:[id number] DOB: [birth year] DOA: 01/04/2013  PCP: Juline Patch, MD  Admit date: 01/04/2013 Discharge date: 01/07/2013  Time spent: 40 minutes  Recommendations for Outpatient Follow-up:  Follow up with PCP within one week of discharge.   Discharge Diagnoses:  Principal Problem:   Anemia Active Problems:   HYPERTENSION   HEMORRHOIDS, INTERNAL   Rheumatoid arthritis(714.0)   Diabetes mellitus type 2, insulin dependent   Iron deficiency   Blood in stool   Personal history of colonic polyps   Discharge Condition: Stable  Diet recommendation: High Fiber, cardiac/heart healthy.  Filed Weights   01/04/13 2302 01/06/13 0616 01/07/13 0501  Weight: 58.196 kg (128 lb 4.8 oz) 55.702 kg (122 lb 12.8 oz) 55.52 kg (122 lb 6.4 oz)    History of present illness:  Marisa [name] is a 72 y.o. female with Past medical history of diabetes, CAD, GERD, hypertension, anemia of chronic disease.  The patient presented today as she went to her PCPs office due to 3 weeks off fatigue and tiredness. She says that since last 3 weeks she has been having bright red blood per rectum with every bowel movement which is in significant amount. Patient denies any complaint of syncope dizziness lightheadedness. She denies any complaints of hematemesis or vomiting of blood. She denies any coffee ground emesis. She does have complaints of gastric reflux but denies any worsening of the same. She denies any abdominal pain at present. She denies any chest pain shortness of breath or chest tightness. She denies any dizziness or lightheadedness or focal neurological deficit. She mentions that she has bright red blood per rectum in mild pain with the bowel movements but does not have any significant severe pain in the anal region.  She denies being on any blood thinners and she denies bleeding anywhere as. She denies cough she denies burning urination.  Recently she has been  having recurrent upper respiratory tract infection and has been placed on azithromycin. She denies any exposure with small kids with rash.   Hospital Course:  This is a 72 year old female past medical history diabetes coronary artery disease, hypertension, anemia of chronic disease are present to the emergency department from her PCPs office for her fatigue and tiredness which had been progressing for 3 weeks. Patient is a she had bright red blood per rectum. Patient also complained of dizziness. At that time she was admitted and gastroenterology was consulted. Colonoscopy was conducted showing the patient had mild diverticulosis no polyps. Patient was recommended for patient to have high-fiber diet. She was also found to have hemorrhoids at which point the hemorrhoid cream and suppository was recommended by gastroenterologist.  Patient also has hypertension which she remained on her home medications. Her rheumatoid arthritis remained stable during her stay. Patient also has diabetes type 2 tissue and she was placed on insulin sliding scale along with the Levemir.  Patient should follow up with her primary care physician. As discussed with patient regarding appropriate diet.  Procedures:  Colonoscopy on 01/06/2013 ENDOSCOPIC IMPRESSION:  1. Mild diverticulosis was noted in the sigmoid colon  2. Moderate internal and external hemorrrhoids  RECOMMENDATIONS:  1. High fiber diet with liberal fluid intake.  2. Given your age, comordities and absence of polyps on this exam  you will not need another colonoscopy for colon cancer screening or  polyp surveillance.  3. Treat hemorrhoids with HC supp and HC creams. If symptoms  persist proceed with surgical referral   Consultations: Gastroenterology  Discharge Exam: Filed Vitals:   01/07/13 0900  BP: 121/60  Pulse: 126  Temp:   Resp:     General: Awake Alert, Oriented X 3, Normal affect  HEENT: [place]/AT,PERRAL  Neck: Supple Neck,No JVD, No  cervical lymphadenopathy appriciated.  Respiratory: Symmetrical Chest wall movement, Good air movement bilaterally, CTAB  Cardiovascular: RRR, No Gallops,Rubs or new Murmurs  Abdomen: Soft, nontender, nondistended, positive bowel sounds.  Extremities: No Cyanosis, Clubbing, edema.  Neuro: Grossly intact, nonfocal.   Discharge Instructions  Discharge Orders   Future Orders Complete By Expires   Diet - low sodium heart healthy  As directed    Scheduling Instructions:     High fiber diet.   Discharge instructions  As directed    Comments:     Follow up with PCP within one week of discharge.  Follow high fiber diet.   Increase activity slowly  As directed        Medication List         albuterol (5 MG/ML) 0.5% nebulizer solution  Commonly known as:  PROVENTIL  Take 0.5 mLs (2.5 mg total) by nebulization every 6 (six) hours as needed for wheezing or shortness of breath.     aspirin EC 81 MG tablet  Take 81 mg by mouth daily.     Co Q 10 100 MG Caps  Take 100 mg by mouth daily.     docusate sodium 100 MG capsule  Commonly known as:  COLACE  Take 1 capsule (100 mg total) by mouth daily as needed for constipation. Daily as needed     esomeprazole 40 MG capsule  Commonly known as:  NEXIUM  Take 40 mg by mouth 2 (two) times daily.     estrogens (conjugated) 0.625 MG tablet  Commonly known as:  PREMARIN  Take 0.625 mg by mouth daily.     folic acid 1 MG tablet  Commonly known as:  FOLVITE  Take 1 mg by mouth daily.     HYDROcodone-acetaminophen 5-325 MG per tablet  Commonly known as:  NORCO/VICODIN  Take 2 tablets by mouth every 6 (six) hours as needed for pain.     INTEGRA PLUS Caps  Take 1 capsule by mouth daily. Take 1 cap daily.     leflunomide 20 MG tablet  Commonly known as:  ARAVA  Take 20 mg by mouth daily.     levalbuterol 45 MCG/ACT inhaler  Commonly known as:  XOPENEX HFA  Inhale 1-2 puffs into the lungs every 4 (four) hours as needed for wheezing.      LEVEMIR FLEXPEN 100 UNIT/ML injection  Generic drug:  insulin detemir  Inject 22 Units into the skin daily at 12 noon.     lisinopril 20 MG tablet  Commonly known as:  PRINIVIL,ZESTRIL  Take 20 mg by mouth daily.     LORazepam 0.5 MG tablet  Commonly known as:  ATIVAN  Take 0.5 mg by mouth every 8 (eight) hours.     LYRICA 75 MG capsule  Generic drug:  pregabalin  Take 75 mg by mouth 2 (two) times daily. 1 IN THE AM AND 2 IN THE PM     MULTIVITAMINS PO  Take 1 tablet by mouth. Daily.     NOVOLOG FLEXPEN 100 UNIT/ML injection  Generic drug:  insulin aspart  Inject 3-12 Units into the skin 3 (three) times daily before meals. Sliding scale     potassium chloride SA 20 MEQ tablet  Commonly known as:  K-DUR,KLOR-CON  Take 20 mEq by mouth 2 (two) times daily.     predniSONE 10 MG tablet  Commonly known as:  DELTASONE  Take 10 mg by mouth daily.     PREPARATION H 0.25-3-14-71.9 % rectal ointment  Generic drug:  phenylephrine-shark liver oil-mineral oil-petrolatum  Place 1 application rectally 2 (two) times daily as needed for hemorrhoids.     shark liver oil-cocoa butter 0.25-3-85.5 % suppository  Commonly known as:  PREPARATION H  Place 1 suppository rectally as needed for hemorrhoids.     shark liver oil-cocoa butter 0.25-3-85.5 % suppository  Commonly known as:  PREPARATION H  Place 1 suppository rectally as needed for hemorrhoids.     TOPROL XL 100 MG 24 hr tablet  Generic drug:  metoprolol succinate  Take 100 mg by mouth daily.     vitamin A 8000 UNIT capsule  Take 8,000 Units by mouth daily.     Vitamin D 2000 UNITS Caps  Take 2,000 capsules by mouth daily.       Allergies  Allergen Reactions  . Demerol [Meperidine] Other (See Comments)    Drives me crazy   . Penicillins Other (See Comments)    Throat swelling       Follow-up Information   Follow up with PANG,RICHARD, MD In 1 week.   Specialty:  Internal Medicine   Contact information:   [address] [place] [address] [phone]        The results of significant diagnostics from this hospitalization (including imaging, microbiology, ancillary and laboratory) are listed below for reference.    Significant Diagnostic Studies: No results found.  Microbiology: Recent Results (from the past 240 hour(s))  MRSA PCR SCREENING     Status: None   Collection Time    01/04/13 11:09 PM      Result Value Range Status   MRSA by PCR NEGATIVE  NEGATIVE Final   Comment:            The GeneXpert MRSA Assay (FDA     approved for NASAL specimens     only), is one component of a     comprehensive MRSA colonization     surveillance program. It is not     intended to diagnose MRSA     infection nor to guide or     monitor treatment for     MRSA infections.     Labs: Basic Metabolic Panel:  Recent Labs Lab 01/04/13 1939 01/05/13 0835 01/07/13 0725  NA 132* 137 134*  K 5.5* 3.8 3.6  CL 99 98 99  CO2  --  29 26  GLUCOSE 270* 156* 288*  BUN 15 14 14   CREATININE 1.20* 1.00 1.34*  CALCIUM  --  8.6 8.4   Liver Function Tests:  Recent Labs Lab 01/05/13 0835  AST 11  ALT 8  ALKPHOS 89  BILITOT 0.9  PROT 5.8*  ALBUMIN 2.9*   No results found for this basename: LIPASE, AMYLASE,  in the last 168 hours No results found for this basename: AMMONIA,  in the last 168 hours CBC:  Recent Labs Lab 01/04/13 1930 01/04/13 1939 01/05/13 0835 01/06/13 0842 01/07/13 0725  WBC 8.9  --  8.4 7.3 9.0  NEUTROABS 8.4*  --  6.1  --   --   HGB 4.6* 5.1* 8.0* 8.4* 8.2*  HCT 14.8* 15.0* 24.4* 26.2* 25.7*  MCV 83.1  --  81.3 82.4 83.7  PLT 223  --  183 181 185  Cardiac Enzymes: No results found for this basename: CKTOTAL, CKMB, CKMBINDEX, TROPONINI,  in the last 168 hours BNP: BNP (last 3 results)  Recent Labs  06/06/12 1000  PROBNP 405.9*   CBG:  Recent Labs Lab 01/06/13 2043 01/06/13 2154 01/07/13 0210 01/07/13 0337 01/07/13 0601  GLUCAP  226* 289* 189* 178* 319*       Signed:  Drayton Tieu  [facility] 01/07/2013, 10:42 AM

## 2013-01-08 LAB — TYPE AND SCREEN
Unit division: 0
Unit division: 0

## 2013-01-14 DIAGNOSIS — D649 Anemia, unspecified: Secondary | ICD-10-CM | POA: Diagnosis not present

## 2013-01-14 DIAGNOSIS — K219 Gastro-esophageal reflux disease without esophagitis: Secondary | ICD-10-CM | POA: Diagnosis not present

## 2013-01-14 DIAGNOSIS — B379 Candidiasis, unspecified: Secondary | ICD-10-CM | POA: Diagnosis not present

## 2013-01-20 DIAGNOSIS — Z79899 Other long term (current) drug therapy: Secondary | ICD-10-CM | POA: Diagnosis not present

## 2013-01-20 DIAGNOSIS — K649 Unspecified hemorrhoids: Secondary | ICD-10-CM | POA: Diagnosis not present

## 2013-01-20 DIAGNOSIS — B379 Candidiasis, unspecified: Secondary | ICD-10-CM | POA: Diagnosis not present

## 2013-01-20 DIAGNOSIS — K219 Gastro-esophageal reflux disease without esophagitis: Secondary | ICD-10-CM | POA: Diagnosis not present

## 2013-01-20 DIAGNOSIS — D649 Anemia, unspecified: Secondary | ICD-10-CM | POA: Diagnosis not present

## 2013-02-10 ENCOUNTER — Inpatient Hospital Stay (HOSPITAL_COMMUNITY)
Admission: EM | Admit: 2013-02-10 | Discharge: 2013-02-12 | DRG: 812 | Disposition: A | Discharge status: Home / Self Care | Payer: Medicare Other | Attending: Internal Medicine | Admitting: Internal Medicine

## 2013-02-10 ENCOUNTER — Encounter (HOSPITAL_COMMUNITY): Payer: Self-pay | Admitting: Emergency Medicine

## 2013-02-10 DIAGNOSIS — F411 Generalized anxiety disorder: Secondary | ICD-10-CM | POA: Diagnosis not present

## 2013-02-10 DIAGNOSIS — Z9981 Dependence on supplemental oxygen: Secondary | ICD-10-CM

## 2013-02-10 DIAGNOSIS — J449 Chronic obstructive pulmonary disease, unspecified: Secondary | ICD-10-CM

## 2013-02-10 DIAGNOSIS — M069 Rheumatoid arthritis, unspecified: Secondary | ICD-10-CM | POA: Diagnosis present

## 2013-02-10 DIAGNOSIS — K649 Unspecified hemorrhoids: Secondary | ICD-10-CM | POA: Diagnosis not present

## 2013-02-10 DIAGNOSIS — D509 Iron deficiency anemia, unspecified: Secondary | ICD-10-CM

## 2013-02-10 DIAGNOSIS — J45909 Unspecified asthma, uncomplicated: Secondary | ICD-10-CM

## 2013-02-10 DIAGNOSIS — IMO0002 Reserved for concepts with insufficient information to code with codable children: Secondary | ICD-10-CM

## 2013-02-10 DIAGNOSIS — I499 Cardiac arrhythmia, unspecified: Secondary | ICD-10-CM

## 2013-02-10 DIAGNOSIS — R1110 Vomiting, unspecified: Secondary | ICD-10-CM

## 2013-02-10 DIAGNOSIS — Z880 Allergy status to penicillin: Secondary | ICD-10-CM

## 2013-02-10 DIAGNOSIS — Z794 Long term (current) use of insulin: Secondary | ICD-10-CM | POA: Diagnosis not present

## 2013-02-10 DIAGNOSIS — K2970 Gastritis, unspecified, without bleeding: Secondary | ICD-10-CM | POA: Diagnosis present

## 2013-02-10 DIAGNOSIS — K648 Other hemorrhoids: Secondary | ICD-10-CM | POA: Diagnosis present

## 2013-02-10 DIAGNOSIS — I252 Old myocardial infarction: Secondary | ICD-10-CM | POA: Diagnosis not present

## 2013-02-10 DIAGNOSIS — I10 Essential (primary) hypertension: Secondary | ICD-10-CM | POA: Diagnosis present

## 2013-02-10 DIAGNOSIS — E871 Hypo-osmolality and hyponatremia: Secondary | ICD-10-CM

## 2013-02-10 DIAGNOSIS — R51 Headache: Secondary | ICD-10-CM

## 2013-02-10 DIAGNOSIS — R112 Nausea with vomiting, unspecified: Secondary | ICD-10-CM | POA: Diagnosis not present

## 2013-02-10 DIAGNOSIS — E611 Iron deficiency: Secondary | ICD-10-CM | POA: Diagnosis present

## 2013-02-10 DIAGNOSIS — C44300 Unspecified malignant neoplasm of skin of unspecified part of face: Secondary | ICD-10-CM

## 2013-02-10 DIAGNOSIS — R0600 Dyspnea, unspecified: Secondary | ICD-10-CM | POA: Diagnosis present

## 2013-02-10 DIAGNOSIS — I309 Acute pericarditis, unspecified: Secondary | ICD-10-CM

## 2013-02-10 DIAGNOSIS — D126 Benign neoplasm of colon, unspecified: Secondary | ICD-10-CM

## 2013-02-10 DIAGNOSIS — E876 Hypokalemia: Secondary | ICD-10-CM

## 2013-02-10 DIAGNOSIS — R000 Tachycardia, unspecified: Secondary | ICD-10-CM

## 2013-02-10 DIAGNOSIS — Z79899 Other long term (current) drug therapy: Secondary | ICD-10-CM | POA: Diagnosis not present

## 2013-02-10 DIAGNOSIS — D62 Acute posthemorrhagic anemia: Principal | ICD-10-CM | POA: Diagnosis present

## 2013-02-10 DIAGNOSIS — K921 Melena: Secondary | ICD-10-CM

## 2013-02-10 DIAGNOSIS — R5381 Other malaise: Secondary | ICD-10-CM | POA: Diagnosis not present

## 2013-02-10 DIAGNOSIS — K625 Hemorrhage of anus and rectum: Secondary | ICD-10-CM

## 2013-02-10 DIAGNOSIS — E119 Type 2 diabetes mellitus without complications: Secondary | ICD-10-CM

## 2013-02-10 DIAGNOSIS — K2940 Chronic atrophic gastritis without bleeding: Secondary | ICD-10-CM

## 2013-02-10 DIAGNOSIS — J309 Allergic rhinitis, unspecified: Secondary | ICD-10-CM

## 2013-02-10 DIAGNOSIS — B9562 Methicillin resistant Staphylococcus aureus infection as the cause of diseases classified elsewhere: Secondary | ICD-10-CM

## 2013-02-10 DIAGNOSIS — R197 Diarrhea, unspecified: Secondary | ICD-10-CM

## 2013-02-10 DIAGNOSIS — R404 Transient alteration of awareness: Secondary | ICD-10-CM | POA: Diagnosis not present

## 2013-02-10 DIAGNOSIS — Z7982 Long term (current) use of aspirin: Secondary | ICD-10-CM | POA: Diagnosis not present

## 2013-02-10 DIAGNOSIS — K5900 Constipation, unspecified: Secondary | ICD-10-CM | POA: Diagnosis present

## 2013-02-10 DIAGNOSIS — E46 Unspecified protein-calorie malnutrition: Secondary | ICD-10-CM

## 2013-02-10 DIAGNOSIS — K644 Residual hemorrhoidal skin tags: Secondary | ICD-10-CM | POA: Diagnosis not present

## 2013-02-10 DIAGNOSIS — Z860100 Personal history of colon polyps, unspecified: Secondary | ICD-10-CM

## 2013-02-10 DIAGNOSIS — G4730 Sleep apnea, unspecified: Secondary | ICD-10-CM

## 2013-02-10 DIAGNOSIS — J9610 Chronic respiratory failure, unspecified whether with hypoxia or hypercapnia: Secondary | ICD-10-CM | POA: Diagnosis not present

## 2013-02-10 DIAGNOSIS — K5289 Other specified noninfective gastroenteritis and colitis: Secondary | ICD-10-CM

## 2013-02-10 DIAGNOSIS — K5909 Other constipation: Secondary | ICD-10-CM

## 2013-02-10 DIAGNOSIS — R6510 Systemic inflammatory response syndrome (SIRS) of non-infectious origin without acute organ dysfunction: Secondary | ICD-10-CM

## 2013-02-10 DIAGNOSIS — N179 Acute kidney failure, unspecified: Secondary | ICD-10-CM

## 2013-02-10 DIAGNOSIS — R0602 Shortness of breath: Secondary | ICD-10-CM

## 2013-02-10 DIAGNOSIS — K529 Noninfective gastroenteritis and colitis, unspecified: Secondary | ICD-10-CM | POA: Diagnosis present

## 2013-02-10 DIAGNOSIS — J4489 Other specified chronic obstructive pulmonary disease: Secondary | ICD-10-CM

## 2013-02-10 DIAGNOSIS — K222 Esophageal obstruction: Secondary | ICD-10-CM

## 2013-02-10 DIAGNOSIS — J40 Bronchitis, not specified as acute or chronic: Secondary | ICD-10-CM

## 2013-02-10 DIAGNOSIS — L0231 Cutaneous abscess of buttock: Secondary | ICD-10-CM

## 2013-02-10 DIAGNOSIS — D649 Anemia, unspecified: Secondary | ICD-10-CM | POA: Diagnosis not present

## 2013-02-10 DIAGNOSIS — K589 Irritable bowel syndrome without diarrhea: Secondary | ICD-10-CM

## 2013-02-10 DIAGNOSIS — K2990 Gastroduodenitis, unspecified, without bleeding: Secondary | ICD-10-CM

## 2013-02-10 DIAGNOSIS — K219 Gastro-esophageal reflux disease without esophagitis: Secondary | ICD-10-CM | POA: Diagnosis present

## 2013-02-10 LAB — COMPREHENSIVE METABOLIC PANEL
ALT: 9 U/L (ref 0–35)
Alkaline Phosphatase: 111 U/L (ref 39–117)
CO2: 30 mEq/L (ref 19–32)
Chloride: 96 mEq/L (ref 96–112)
GFR calc Af Amer: 54 mL/min — ABNORMAL LOW (ref >90)
GFR calc non Af Amer: 46 mL/min — ABNORMAL LOW (ref >90)
Glucose, Bld: 78 mg/dL (ref 70–99)
Potassium: 4.3 mEq/L (ref 3.5–5.1)
Sodium: 136 mEq/L (ref 135–145)
Total Bilirubin: 0.1 mg/dL — ABNORMAL LOW (ref 0.3–1.2)

## 2013-02-10 LAB — GLUCOSE, CAPILLARY

## 2013-02-10 LAB — CBC WITH DIFFERENTIAL/PLATELET
Lymphocytes Relative: 3 % — ABNORMAL LOW (ref 12–46)
Lymphs Abs: 0.3 10*3/uL — ABNORMAL LOW (ref 0.7–4.0)
MCHC: 31.2 g/dL (ref 30.0–36.0)
MCV: 84 fL (ref 78.0–100.0)
Neutro Abs: 8.1 10*3/uL — ABNORMAL HIGH (ref 1.7–7.7)
Neutrophils Relative %: 94 % — ABNORMAL HIGH (ref 43–77)
Platelets: 342 10*3/uL (ref 150–400)
RBC: 2.25 MIL/uL — ABNORMAL LOW (ref 3.87–5.11)
WBC: 8.6 10*3/uL (ref 4.0–10.5)

## 2013-02-10 LAB — PREPARE RBC (CROSSMATCH)

## 2013-02-10 MED ORDER — PANTOPRAZOLE SODIUM 40 MG PO TBEC
40.0000 mg | DELAYED_RELEASE_TABLET | Freq: Two times a day (BID) | ORAL | Status: DC
  Administered 2013-02-10 – 2013-02-12 (×4): 40 mg via ORAL
  Filled 2013-02-10 (×4): qty 1

## 2013-02-10 MED ORDER — INSULIN DETEMIR 100 UNIT/ML ~~LOC~~ SOLN
5.0000 [IU] | Freq: Every day | SUBCUTANEOUS | Status: DC
  Administered 2013-02-11: 5 [IU] via SUBCUTANEOUS
  Filled 2013-02-10 (×4): qty 0.05

## 2013-02-10 MED ORDER — ACETAMINOPHEN 650 MG RE SUPP: 650.0000 mg | Freq: Four times a day (QID) | RECTAL | Status: DC | PRN

## 2013-02-10 MED ORDER — ONDANSETRON HCL 4 MG PO TABS: 4.0000 mg | ORAL_TABLET | Freq: Four times a day (QID) | ORAL | Status: DC | PRN

## 2013-02-10 MED ORDER — METOPROLOL TARTRATE 12.5 MG HALF TABLET
12.5000 mg | ORAL_TABLET | Freq: Two times a day (BID) | ORAL | Status: DC
  Administered 2013-02-10: 12.5 mg via ORAL
  Filled 2013-02-10: qty 1

## 2013-02-10 MED ORDER — HYDROCODONE-ACETAMINOPHEN 5-325 MG PO TABS: 1.0000 | ORAL_TABLET | Freq: Four times a day (QID) | ORAL | Status: DC | PRN

## 2013-02-10 MED ORDER — ONDANSETRON HCL 4 MG/2ML IJ SOLN: 4.0000 mg | Freq: Four times a day (QID) | INTRAMUSCULAR | Status: DC | PRN

## 2013-02-10 MED ORDER — ACETAMINOPHEN 325 MG PO TABS: 650.0000 mg | ORAL_TABLET | Freq: Four times a day (QID) | ORAL | Status: DC | PRN

## 2013-02-10 MED ORDER — PREGABALIN 75 MG PO CAPS
75.0000 mg | ORAL_CAPSULE | Freq: Two times a day (BID) | ORAL | Status: DC
  Administered 2013-02-10 – 2013-02-12 (×4): 75 mg via ORAL
  Filled 2013-02-10 (×2): qty 1
  Filled 2013-02-10: qty 3
  Filled 2013-02-10: qty 1

## 2013-02-10 MED ORDER — LORAZEPAM 0.5 MG PO TABS
0.5000 mg | ORAL_TABLET | Freq: Four times a day (QID) | ORAL | Status: DC | PRN
  Administered 2013-02-10 – 2013-02-12 (×4): 0.5 mg via ORAL
  Filled 2013-02-10 (×4): qty 1

## 2013-02-10 MED ORDER — ALBUTEROL SULFATE (5 MG/ML) 0.5% IN NEBU: 2.5000 mg | INHALATION_SOLUTION | Freq: Four times a day (QID) | RESPIRATORY_TRACT | Status: DC | PRN

## 2013-02-10 MED ORDER — PREDNISONE 10 MG PO TABS
10.0000 mg | ORAL_TABLET | Freq: Every day | ORAL | Status: DC
  Administered 2013-02-11 – 2013-02-12 (×2): 10 mg via ORAL
  Filled 2013-02-10 (×3): qty 1

## 2013-02-10 NOTE — ED Provider Notes (Signed)
I saw and evaluated the patient, reviewed the resident's note and I agree with the findings and plan.  EKG Interpretation     Ventricular Rate:  86 PR Interval:  185 QRS Duration: 73 QT Interval:  334 QTC Calculation: 399 R Axis:   67 Text Interpretation:  Sinus rhythm Probable LVH with secondary repol abnrm No significant change was found           This is a 72 yo female who presents by EMS with low hemoglobin. This was recently admitted anemia and required blood transfusions. She has known internal hemorrhoids. She has recently had a colonoscopy that was negative. Patient reports last bright red blood per rectum 3 days ago. She reports fatigue and dizziness. Patient states that she got her flu shot 2 weeks ago and since has had vomiting and diarrhea. She denies any emesis or hematemesis. She does endorse mild left upper quadrant pain.  Exam: Patient is pale and noted to be tachycardic to 112. Abdomen is soft without rebound or guarding. Mild tenderness to palpation of the left upper quadrant.  Hemoglobin and hematocrit are reconfirmed here. Patient will be given 2 units of packed red cells. Patient will be admitted for further management.   Shon Baton, MD 02/11/13 [phone]

## 2013-02-10 NOTE — ED Notes (Signed)
Lab called with critical hemoglobin of 5.9, EDP notified.

## 2013-02-10 NOTE — Progress Notes (Signed)
Unit CM UR Completed by [facility] ED CM  W. Cara Thaxton RN  

## 2013-02-10 NOTE — ED Provider Notes (Signed)
CSN: [id number]     Arrival date & time 02/10/13  1554 History   First MD Initiated Contact with Patient 02/10/13 1555     Chief Complaint  Patient presents with  . Anemia   (Consider location/radiation/quality/duration/timing/severity/associated sxs/prior Treatment) The history is provided by the patient. No language interpreter was used.   Marisa [name] is a 72 y.o. Caucasian female with past medical history of external hemorrhoids and lower GI bleeds who comes emergency department today with bleeding from her rectum and anemia. Approximately a week ago she developed nausea and diarrhea.  During the diarrhea she developed bleeding from her hemorrhoids. Stated that the bleeding was severe. Since resolved possibly 2-3 days ago. She has no abdominal pain. She has no fevers, chills, or diarrhea. She did vomit one time last night. Has not vomited today. Yesterday she began feeling weak, short of breath, and tired. As a result with her primary care doctor today. Her primary care doctor obtained a hemoglobin which was low at 5.8. As a result he sent her to the emergency department. She had a colonoscopy a month ago which demonstrated no polyps and significant hemorrhoids.   Past Medical History  Diagnosis Date  . Diabetic ketoacidosis 11/2010  . Myocardial infarction     Chest pain s/p normal cath in 08/2004 then NSTEMI during 11/2010 admission, negative Myoview  . Diabetes mellitus type 2, insulin dependent     initial diagnoses 11/2008  . GERD (gastroesophageal reflux disease)   . Esophageal stricture 06/2004    Dilation 06/2004  . Hypertension   . Supraventricular tachycardia   . history of  pericarditis 12/2002  . Pericardial effusion   . Rheumatoid arthritis(714.0)     On MTX and chronic steroids  . Osteoarthritis   . Asthma     Home 3L O2   . Seizure 11/2008  . Rectal bleeding 11/2010    Large hemorrhoids  . Chronic anemia   . History of blood transfusion    Past Surgical History   Procedure Laterality Date  . Cholecystectomy    . Abdominal hysterectomy    . Incise and drain abcess  09/2011    I&D of peri-rectal abcess per Dr Abbey Chatters.   . Colonoscopy N/A 01/06/2013    Procedure: COLONOSCOPY;  Surgeon: [name], MD;  Location: [facility] ENDOSCOPY;  Service: Endoscopy;  Laterality: N/A;   No family history on file. History  Substance Use Topics  . Smoking status: Never Smoker   . Smokeless tobacco: Never Used  . Alcohol Use: No   OB History   Grav Para Term Preterm Abortions TAB SAB Ect Mult Living                 Review of Systems  Constitutional: Positive for activity change. Negative for fever and chills.  Respiratory: Negative for cough and shortness of breath.   Gastrointestinal: Positive for blood in stool and anal bleeding. Negative for nausea, vomiting, abdominal pain, diarrhea and abdominal distention.  Genitourinary: Negative for dysuria, urgency and frequency.  Skin: Negative for color change and wound.  Neurological: Positive for weakness (generalized). Negative for numbness.  All other systems reviewed and are negative.    Allergies  Demerol and Penicillins  Home Medications   Current Outpatient Rx  Name  Route  Sig  Dispense  Refill  . albuterol (PROVENTIL) (5 MG/ML) 0.5% nebulizer solution   Nebulization   Take 2.5 mg by nebulization every 6 (six) hours as needed for wheezing or shortness of  breath.         Marland Kitchen aspirin EC 81 MG tablet   Oral   Take 81 mg by mouth daily.         . Cholecalciferol (VITAMIN D) 2000 UNITS CAPS   Oral   Take 2,000 capsules by mouth daily.         . Coenzyme Q10 (CO Q 10) 100 MG CAPS   Oral   Take 100 mg by mouth daily.         Marland Kitchen esomeprazole (NEXIUM) 40 MG capsule   Oral   Take 40 mg by mouth 2 (two) times daily.          Marland Kitchen estrogens, conjugated, (PREMARIN) 0.625 MG tablet   Oral   Take 0.625 mg by mouth daily.          Marland Kitchen FeFum-FePoly-FA-B Cmp-C-Biot (INTEGRA PLUS) CAPS    Oral   Take 1 capsule by mouth daily.         . folic acid (FOLVITE) 1 MG tablet   Oral   Take 1 mg by mouth daily.           Marland Kitchen HYDROcodone-acetaminophen (NORCO/VICODIN) 5-325 MG per tablet   Oral   Take 2 tablets by mouth every 6 (six) hours as needed for pain.         Marland Kitchen insulin aspart (NOVOLOG FLEXPEN) 100 UNIT/ML injection   Subcutaneous   Inject 3-12 Units into the skin 3 (three) times daily before meals. Sliding scale         . insulin detemir (LEVEMIR FLEXPEN) 100 UNIT/ML injection   Subcutaneous   Inject 22 Units into the skin daily at 12 noon.          . leflunomide (ARAVA) 20 MG tablet   Oral   Take 20 mg by mouth daily.         Marland Kitchen levalbuterol (XOPENEX HFA) 45 MCG/ACT inhaler   Inhalation   Inhale 1-2 puffs into the lungs every 4 (four) hours as needed for wheezing.         Marland Kitchen lisinopril (PRINIVIL,ZESTRIL) 20 MG tablet   Oral   Take 20 mg by mouth daily.         Marland Kitchen LORazepam (ATIVAN) 0.5 MG tablet   Oral   Take 0.5 mg by mouth every 8 (eight) hours.         . metoprolol (TOPROL XL) 100 MG 24 hr tablet   Oral   Take 100 mg by mouth daily.          . Multiple Vitamin (MULTIVITAMINS PO)   Oral   Take 1 tablet by mouth. Daily.          . phenylephrine-shark liver oil-mineral oil-petrolatum (PREPARATION H) 0.25-3-14-71.9 % rectal ointment   Rectal   Place 1 application rectally 2 (two) times daily as needed for hemorrhoids.         . potassium chloride SA (K-DUR,KLOR-CON) 20 MEQ tablet   Oral   Take 20 mEq by mouth 2 (two) times daily.          . predniSONE (DELTASONE) 10 MG tablet   Oral   Take 10 mg by mouth daily.         . pregabalin (LYRICA) 75 MG capsule   Oral   Take 75-150 mg by mouth 2 (two) times daily. 1 IN THE AM AND 2 IN THE PM         . promethazine (PHENERGAN) 12.5 MG tablet  Oral   Take 12.5 mg by mouth every 6 (six) hours as needed for nausea.         . vitamin A 8000 UNIT capsule   Oral   Take 8,000  Units by mouth daily.         Marland Kitchen docusate sodium (COLACE) 100 MG capsule   Oral   Take 100 mg by mouth daily as needed for constipation.          BP 158/76  Pulse 107  Temp(Src) 98.3 F (36.8 C) (Oral)  Resp 18  Ht 4\' 11"  (1.499 m)  Wt 122 lb (55.339 kg)  BMI 24.63 kg/m2  SpO2 99% Physical Exam  Nursing note and vitals reviewed. Constitutional: She is oriented to person, place, and time. She appears well-developed and well-nourished. She appears lethargic. She is easily aroused. No distress.  HENT:  Head: Normocephalic and atraumatic.  Eyes: Pupils are equal, round, and reactive to light.  Neck: Normal range of motion.  Cardiovascular: Normal rate, regular rhythm, normal heart sounds and intact distal pulses.   Pulmonary/Chest: Effort normal. No respiratory distress. She has no wheezes. She exhibits no tenderness.  Abdominal: Soft. Bowel sounds are normal. She exhibits no distension. There is no tenderness. There is no rebound and no guarding.  Genitourinary: Rectal exam shows external hemorrhoid and internal hemorrhoid. Guaiac positive stool.  Neurological: She is oriented to person, place, and time and easily aroused. She has normal strength. She appears lethargic. No cranial nerve deficit or sensory deficit. She exhibits normal muscle tone. Coordination and gait normal.  Skin: Skin is warm and dry.    ED Course  Procedures (including critical care time) Labs Review Labs Reviewed  CBC WITH DIFFERENTIAL - Abnormal; Notable for the following:    RBC 2.25 (*)    Hemoglobin 5.9 (*)    HCT 18.9 (*)    RDW 16.3 (*)    Neutrophils Relative % 94 (*)    Neutro Abs 8.1 (*)    Lymphocytes Relative 3 (*)    Lymphs Abs 0.3 (*)    All other components within normal limits  COMPREHENSIVE METABOLIC PANEL - Abnormal; Notable for the following:    Creatinine, Ser 1.16 (*)    Total Protein 5.4 (*)    Albumin 2.6 (*)    Total Bilirubin 0.1 (*)    GFR calc non Af Amer 46 (*)    GFR  calc Af Amer 54 (*)    All other components within normal limits  OCCULT BLOOD, POC DEVICE - Abnormal; Notable for the following:    Fecal Occult Bld POSITIVE (*)    All other components within normal limits  LIPASE, BLOOD  URINALYSIS, ROUTINE W REFLEX MICROSCOPIC  CG4 I-STAT (LACTIC ACID)  PREPARE RBC (CROSSMATCH)  TYPE AND SCREEN   Imaging Review No results found.  EKG Interpretation     Ventricular Rate:  86 PR Interval:  185 QRS Duration: 73 QT Interval:  334 QTC Calculation: 399 R Axis:   67 Text Interpretation:  Sinus rhythm Probable LVH with secondary repol abnrm No significant change was found            MDM   Patient is a 72 year old Caucasian female with past medical history of anemia and rectal bleeding comes emergency department today with anemia, vomiting, and diarrhea, and rectal bleeding. Physical exam as above. With history of vomiting, diarrhea, and rectal bleeding abdominal labs are obtained. Workup included CBC, CMP, lactic acid, lipase, UA, and EKG. EKG  as above. With normal colonoscopy a month ago and significant hemorrhoids bleeding is likely secondary to hemorrhoids since it is bright red.  CMP was unremarkable. Lipase is 11. CBC had a hemoglobin of 5.9 down from 8 a month ago. Type and screen was performed a transfusion was ordered. Lactic acid is 1.13. With significant drop in hemoglobin and dizziness Marisa [name] is felt to require admission to the hospital.  Was unremarkable CMP and a benign abdominal exam doubt appendicitis, acute cholecystitis, pancreatitis. She was admitted to the hospitalist service in stable condition.  Labs reviewed by myself and considered and medical decision-making. Care was discussed with my attending Dr. Wilkie Aye.  1. Anemia   2. Rectal bleeding   3. Hemorrhoids   4. Vomiting   5. Diarrhea       Bethann Berkshire, MD 02/10/13 [phone]

## 2013-02-10 NOTE — ED Notes (Signed)
Pt arrives via [facility] for low hemoglobin. Pt went to her PCP today because she wasn't feeling well and was told that her hgb was 5.8. Pt reports feeling weak x2 weeks, with vomiting and diarrhea. VS 149/61, 112 hr, 100% o2. Pt is alert and oriented x 4. nad noted

## 2013-02-10 NOTE — ED Notes (Signed)
Called phlebotomy to draw labs.

## 2013-02-10 NOTE — ED Notes (Signed)
Pt belongings sent to 6N with pt husband

## 2013-02-10 NOTE — H&P (Signed)
[facility] History and Physical  Marisa [name] ZOX:[id number] DOB: Feb 02, 1941 DOA: 02/10/2013  Referring physician: *EDP PCP: Juline Patch, MD  Specialists: Russella Dar  Chief Complaint: weakness, low hgb  HPI: Marisa [name] is a 72 y.o. female sent to ED from PCP's office with hgb 5.  Has had hemorroidal bleeding for several days. "about a spoonful" a few times daily, usually with defecation.  Had admission and colonoscopy for anemia and rectal bleeding a month ago. Colonoscopy by Dr. Russella Dar showed mild diverticulosis, moderate internal and external hemorrhoids.  hgb 8 at discharge after transfusion.  Anemia panel showed severe iron deficiency.  Several days ago had a "stomach bug", vomited nonbloody several times, with several episodes of "dark" diarrhea. Not tarry.  Last EGD in 2009 showed gastritis.  Has been weak. Fell x2. Usually uses cane or walker. Dyspneic and light headed with standing. Getting first unit PRBC currently. On asa and chronic prednisone. On PPI as outpt.  Review of Systems: systems reviewed.  As above. Otherwise negative.  Past Medical History  Diagnosis Date  . Diabetic ketoacidosis 11/2010  . Myocardial infarction     Chest pain s/p normal cath in 08/2004 then NSTEMI during 11/2010 admission, negative Myoview  . Diabetes mellitus type 2, insulin dependent     initial diagnoses 11/2008  . GERD (gastroesophageal reflux disease)   . Esophageal stricture 06/2004    Dilation 06/2004  . Hypertension   . Supraventricular tachycardia   . history of  pericarditis 12/2002  . Pericardial effusion   . Rheumatoid arthritis(714.0)     On MTX and chronic steroids  . Osteoarthritis   . Asthma     Home 3L O2   . Seizure 11/2008  . Rectal bleeding 11/2010    Large hemorrhoids  . Chronic anemia   . History of blood transfusion    Past Surgical History  Procedure Laterality Date  . Cholecystectomy    . Abdominal hysterectomy    . Incise and drain abcess  09/2011    I&D  of peri-rectal abcess per Dr Abbey Chatters.   . Colonoscopy N/A 01/06/2013    Procedure: COLONOSCOPY;  Surgeon: [name], MD;  Location: [facility] ENDOSCOPY;  Service: Endoscopy;  Laterality: N/A;   Social History:  reports that she has never smoked. She has never used smokeless tobacco. She reports that she does not drink alcohol or use illicit drugs. married  Allergies  Allergen Reactions  . Demerol [Meperidine] Other (See Comments)    Drives me crazy   . Penicillins Other (See Comments)    Throat swelling   FH: reviewed. Nothing pertinent  Prior to Admission medications   Medication Sig Start Date End Date Taking? Authorizing Provider  albuterol (PROVENTIL) (5 MG/ML) 0.5% nebulizer solution Take 2.5 mg by nebulization every 6 (six) hours as needed for wheezing or shortness of breath. 10/08/11  Yes Nishant Dhungel, MD  aspirin EC 81 MG tablet Take 81 mg by mouth daily.   Yes Historical Provider, MD  Cholecalciferol (VITAMIN D) 2000 UNITS CAPS Take 2,000 capsules by mouth daily.   Yes Historical Provider, MD  Coenzyme Q10 (CO Q 10) 100 MG CAPS Take 100 mg by mouth daily.   Yes Historical Provider, MD  esomeprazole (NEXIUM) 40 MG capsule Take 40 mg by mouth 2 (two) times daily.    Yes Historical Provider, MD  estrogens, conjugated, (PREMARIN) 0.625 MG tablet Take 0.625 mg by mouth daily.    Yes Historical Provider, MD  FeFum-FePoly-FA-B Cmp-C-Biot (INTEGRA PLUS)  CAPS Take 1 capsule by mouth daily.   Yes Historical Provider, MD  folic acid (FOLVITE) 1 MG tablet Take 1 mg by mouth daily.     Yes Historical Provider, MD  HYDROcodone-acetaminophen (NORCO/VICODIN) 5-325 MG per tablet Take 2 tablets by mouth every 6 (six) hours as needed for pain.   Yes Historical Provider, MD  insulin aspart (NOVOLOG FLEXPEN) 100 UNIT/ML injection Inject 3-12 Units into the skin 3 (three) times daily before meals. Sliding scale   Yes Historical Provider, MD  insulin detemir (LEVEMIR FLEXPEN) 100 UNIT/ML injection  Inject 22 Units into the skin daily at 12 noon.    Yes Historical Provider, MD  leflunomide (ARAVA) 20 MG tablet Take 20 mg by mouth daily.   Yes Historical Provider, MD  levalbuterol [facility] HFA) 45 MCG/ACT inhaler Inhale 1-2 puffs into the lungs every 4 (four) hours as needed for wheezing.   Yes Historical Provider, MD  lisinopril (PRINIVIL,ZESTRIL) 20 MG tablet Take 20 mg by mouth daily.   Yes Historical Provider, MD  LORazepam (ATIVAN) 0.5 MG tablet Take 0.5 mg by mouth every 8 (eight) hours.   Yes Historical Provider, MD  metoprolol (TOPROL XL) 100 MG 24 hr tablet Take 100 mg by mouth daily.    Yes Historical Provider, MD  Multiple Vitamin (MULTIVITAMINS PO) Take 1 tablet by mouth. Daily.    Yes Historical Provider, MD  phenylephrine-shark liver oil-mineral oil-petrolatum (PREPARATION H) 0.25-3-14-71.9 % rectal ointment Place 1 application rectally 2 (two) times daily as needed for hemorrhoids.   Yes Historical Provider, MD  potassium chloride SA (K-DUR,KLOR-CON) 20 MEQ tablet Take 20 mEq by mouth 2 (two) times daily.    Yes Historical Provider, MD  predniSONE (DELTASONE) 10 MG tablet Take 10 mg by mouth daily.   Yes Historical Provider, MD  pregabalin (LYRICA) 75 MG capsule Take 75-150 mg by mouth 2 (two) times daily. 1 IN THE AM AND 2 IN THE PM   Yes Historical Provider, MD  promethazine (PHENERGAN) 12.5 MG tablet Take 12.5 mg by mouth every 6 (six) hours as needed for nausea.   Yes Historical Provider, MD  vitamin A 8000 UNIT capsule Take 8,000 Units by mouth daily.   Yes Historical Provider, MD  docusate sodium (COLACE) 100 MG capsule Take 100 mg by mouth daily as needed for constipation.    Historical Provider, MD   Physical Exam: Filed Vitals:   02/10/13 1900  BP: 168/60  Pulse: 96  Temp:   Resp:    BP 200/67  Pulse 107  Temp(Src) 98 F (36.7 C) (Oral)  Resp 20  Ht 4\' 11"  (1.499 m)  Wt 56.564 kg (124 lb 11.2 oz)  BMI 25.17 kg/m2  SpO2 96%  General Appearance:    Alert,  cooperative, no distress, appears stated age  Head:    Normocephalic, without obvious abnormality, atraumatic  Eyes:    PERRL, pale conjuncitva  Ears:    Normal TM's and external ear canals, both ears  Nose:   Nares normal, septum midline, mucosa normal, no drainage    or sinus tenderness  Throat:   Lips, mucosa, and tongue normal; teeth and gums normal  Neck:   Supple, symmetrical, trachea midline, no adenopathy;    thyroid:  no enlargement/tenderness/nodules; no carotid   bruit or JVD  Back:     Symmetric, no curvature, ROM normal, no CVA tenderness  Lungs:     Clear to auscultation bilaterally, respirations unlabored  Chest Wall:    No tenderness or  deformity   Heart:    Regular rate and rhythm, S1 and S2 normal, no murmur, rub   or gallop     Abdomen:     Soft, non-tender, bowel sounds active all four quadrants,    no masses, no organomegaly  Genitalia:    deferred  Rectal:    Per EDP, hemorrhoids and heme positive  Extremities:   No edema. Deformities of hand consistent with h/o RA  Pulses:   2+ and symmetric all extremities  Skin:   Skin color, texture, turgor normal, no rashes or lesions  Lymph nodes:   Cervical, supraclavicular, and axillary nodes normal  Neurologic:   CNII-XII intact, normal strength, sensation and reflexes    throughout    Psych:  Normal affect  Labs on Admission:  Basic Metabolic Panel:  Recent Labs Lab 02/10/13 1733  NA 136  K 4.3  CL 96  CO2 30  GLUCOSE 78  BUN 13  CREATININE 1.16*  CALCIUM 8.7   Liver Function Tests:  Recent Labs Lab 02/10/13 1733  AST 16  ALT 9  ALKPHOS 111  BILITOT 0.1*  PROT 5.4*  ALBUMIN 2.6*    Recent Labs Lab 02/10/13 1733  LIPASE 11   No results found for this basename: AMMONIA,  in the last 168 hours CBC:  Recent Labs Lab 02/10/13 1733  WBC 8.6  NEUTROABS 8.1*  HGB 5.9*  HCT 18.9*  MCV 84.0  PLT 342   Cardiac Enzymes: No results found for this basename: CKTOTAL, CKMB, CKMBINDEX,  TROPONINI,  in the last 168 hours  BNP (last 3 results)  Recent Labs  06/06/12 1000  PROBNP 405.9*    Assessment/Plan Principal Problem:   Acute blood loss anemia: could be lower from hemorrhoids, but also reports h/o recent dark diarrheal stools.  R/o intermittent chronic UGI blood loss. Transfuse 2 units PRBC.  HD stable. Admit to floor.  Bid PPI and discuss with GI in am.  Likely needs EGD.  Make NPO after midnight. Active Problems:   ANXIETY   HEMORRHOIDS, INTERNAL   GERD   H/o GASTRITIS on EGD in 2009   Rheumatoid arthritis(714.0)   Diabetes mellitus type 2, insulin dependent   Chronic anemia   Iron deficiency   Gastroenteritis, resolved  HTN: hold ace-I and decrease metoprolol dose to avoid hypotension Code Status: *full Family Communication: *husband at bedside Disposition Plan: home  Time spent: 60 minutes  Christiane Ha [facility] Pager [phone]  If 7PM-7AM, please contact night-coverage [email or web address] Password [email or web address] 02/10/2013, 7:09 PM

## 2013-02-11 ENCOUNTER — Encounter (HOSPITAL_COMMUNITY)
Admission: EM | Disposition: A | Discharge status: Home / Self Care | Payer: Self-pay | Source: Home / Self Care | Attending: Internal Medicine

## 2013-02-11 ENCOUNTER — Telehealth: Payer: Self-pay

## 2013-02-11 ENCOUNTER — Encounter (HOSPITAL_COMMUNITY): Payer: Self-pay | Admitting: *Deleted

## 2013-02-11 DIAGNOSIS — E119 Type 2 diabetes mellitus without complications: Secondary | ICD-10-CM | POA: Diagnosis not present

## 2013-02-11 DIAGNOSIS — D509 Iron deficiency anemia, unspecified: Secondary | ICD-10-CM | POA: Diagnosis not present

## 2013-02-11 DIAGNOSIS — K2940 Chronic atrophic gastritis without bleeding: Secondary | ICD-10-CM

## 2013-02-11 DIAGNOSIS — K625 Hemorrhage of anus and rectum: Secondary | ICD-10-CM

## 2013-02-11 DIAGNOSIS — D649 Anemia, unspecified: Secondary | ICD-10-CM | POA: Diagnosis not present

## 2013-02-11 DIAGNOSIS — R0600 Dyspnea, unspecified: Secondary | ICD-10-CM | POA: Diagnosis present

## 2013-02-11 DIAGNOSIS — J9610 Chronic respiratory failure, unspecified whether with hypoxia or hypercapnia: Secondary | ICD-10-CM | POA: Diagnosis not present

## 2013-02-11 DIAGNOSIS — K921 Melena: Secondary | ICD-10-CM | POA: Diagnosis not present

## 2013-02-11 DIAGNOSIS — D62 Acute posthemorrhagic anemia: Secondary | ICD-10-CM | POA: Diagnosis not present

## 2013-02-11 HISTORY — PX: ESOPHAGOGASTRODUODENOSCOPY: SHX5428

## 2013-02-11 LAB — TYPE AND SCREEN
ABO/RH(D): O POS
Unit division: 0
Unit division: 0

## 2013-02-11 LAB — URINALYSIS, ROUTINE W REFLEX MICROSCOPIC
Bilirubin Urine: NEGATIVE
Glucose, UA: NEGATIVE mg/dL
Hgb urine dipstick: NEGATIVE
Ketones, ur: NEGATIVE mg/dL
Specific Gravity, Urine: 1.005 (ref 1.005–1.030)
Urobilinogen, UA: 0.2 mg/dL (ref 0.0–1.0)
pH: 7 (ref 5.0–8.0)

## 2013-02-11 LAB — GLUCOSE, CAPILLARY
Glucose-Capillary: 245 mg/dL — ABNORMAL HIGH (ref 70–99)
Glucose-Capillary: 285 mg/dL — ABNORMAL HIGH (ref 70–99)

## 2013-02-11 LAB — HEMOGLOBIN AND HEMATOCRIT, BLOOD
HCT: 29.2 % — ABNORMAL LOW (ref 36.0–46.0)
Hemoglobin: 9.7 g/dL — ABNORMAL LOW (ref 12.0–15.0)

## 2013-02-11 SURGERY — EGD (ESOPHAGOGASTRODUODENOSCOPY)
Anesthesia: Moderate Sedation

## 2013-02-11 MED ORDER — METOPROLOL TARTRATE 1 MG/ML IV SOLN
5.0000 mg | INTRAVENOUS | Status: DC | PRN
  Filled 2013-02-11: qty 5

## 2013-02-11 MED ORDER — LISINOPRIL 20 MG PO TABS
20.0000 mg | ORAL_TABLET | Freq: Every day | ORAL | Status: DC
  Administered 2013-02-12: 20 mg via ORAL
  Filled 2013-02-11: qty 1

## 2013-02-11 MED ORDER — METOPROLOL TARTRATE 25 MG PO TABS
25.0000 mg | ORAL_TABLET | Freq: Two times a day (BID) | ORAL | Status: AC
  Administered 2013-02-11: 25 mg via ORAL
  Filled 2013-02-11 (×2): qty 1

## 2013-02-11 MED ORDER — HYDRALAZINE HCL 20 MG/ML IJ SOLN
10.0000 mg | Freq: Once | INTRAMUSCULAR | Status: AC
  Administered 2013-02-11: 07:00:00 via INTRAVENOUS
  Filled 2013-02-11: qty 1

## 2013-02-11 MED ORDER — INSULIN ASPART 100 UNIT/ML ~~LOC~~ SOLN
0.0000 [IU] | Freq: Three times a day (TID) | SUBCUTANEOUS | Status: DC
  Administered 2013-02-11: 5 [IU] via SUBCUTANEOUS
  Administered 2013-02-11: 3 [IU] via SUBCUTANEOUS
  Administered 2013-02-12: 5 [IU] via SUBCUTANEOUS

## 2013-02-11 MED ORDER — FUROSEMIDE 10 MG/ML IJ SOLN
INTRAMUSCULAR | Status: AC
  Filled 2013-02-11: qty 4

## 2013-02-11 MED ORDER — METOPROLOL SUCCINATE ER 100 MG PO TB24
100.0000 mg | ORAL_TABLET | Freq: Every day | ORAL | Status: DC
  Administered 2013-02-12: 100 mg via ORAL
  Filled 2013-02-11: qty 1

## 2013-02-11 MED ORDER — FENTANYL CITRATE 0.05 MG/ML IJ SOLN
INTRAMUSCULAR | Status: AC
  Filled 2013-02-11: qty 2

## 2013-02-11 MED ORDER — FENTANYL CITRATE 0.05 MG/ML IJ SOLN
INTRAMUSCULAR | Status: DC | PRN
  Administered 2013-02-11 (×4): 25 ug via INTRAVENOUS

## 2013-02-11 MED ORDER — LEVALBUTEROL HCL 0.63 MG/3ML IN NEBU
0.6300 mg | INHALATION_SOLUTION | Freq: Four times a day (QID) | RESPIRATORY_TRACT | Status: DC | PRN
  Administered 2013-02-11 (×2): 0.63 mg via RESPIRATORY_TRACT
  Filled 2013-02-11 (×3): qty 3

## 2013-02-11 MED ORDER — FUROSEMIDE 10 MG/ML IJ SOLN
40.0000 mg | Freq: Once | INTRAMUSCULAR | Status: AC
  Administered 2013-02-11: 40 mg via INTRAVENOUS

## 2013-02-11 MED ORDER — MIDAZOLAM HCL 10 MG/2ML IJ SOLN
INTRAMUSCULAR | Status: DC | PRN
  Administered 2013-02-11 (×5): 2 mg via INTRAVENOUS

## 2013-02-11 MED ORDER — LISINOPRIL 20 MG PO TABS
20.0000 mg | ORAL_TABLET | Freq: Once | ORAL | Status: AC
  Administered 2013-02-11: 20 mg via ORAL
  Filled 2013-02-11: qty 1

## 2013-02-11 MED ORDER — MIDAZOLAM HCL 5 MG/ML IJ SOLN
INTRAMUSCULAR | Status: AC
  Filled 2013-02-11: qty 2

## 2013-02-11 MED ORDER — BUTAMBEN-TETRACAINE-BENZOCAINE 2-2-14 % EX AERO
INHALATION_SPRAY | CUTANEOUS | Status: DC | PRN
  Administered 2013-02-11: 2 via TOPICAL

## 2013-02-11 MED ORDER — INSULIN ASPART 100 UNIT/ML ~~LOC~~ SOLN
0.0000 [IU] | Freq: Every day | SUBCUTANEOUS | Status: DC
  Administered 2013-02-11: 3 [IU] via SUBCUTANEOUS
  Administered 2013-02-11: 5 [IU] via SUBCUTANEOUS

## 2013-02-11 NOTE — ED Provider Notes (Signed)
I saw and evaluated the patient, reviewed the resident's note and I agree with the findings and plan.  EKG Interpretation     Ventricular Rate:  86 PR Interval:  185 QRS Duration: 73 QT Interval:  334 QTC Calculation: 399 R Axis:   67 Text Interpretation:  Sinus rhythm Probable LVH with secondary repol abnrm No significant change was found              Shon Baton, MD 02/11/13 [phone]

## 2013-02-11 NOTE — Op Note (Signed)
[facility] [address] [place], [address]   ENDOSCOPY PROCEDURE REPORT  PATIENT: Marisa [name], Marisa [name]  MR#: [id number] BIRTHDATE: 11/16/1940 , 71  yrs. old GENDER: Female ENDOSCOPIST: Roxy Cedar, MD REFERRED BY:  [facility] PROCEDURE DATE:  02/11/2013 PROCEDURE:  EGD, diagnostic ASA CLASS:     Class III INDICATIONS:  Anemia.   Heme positive stool.  The patient has had recent evaluation for recurrent rectal bleeding. Colonoscopy revealed hemorrhoids MEDICATIONS: Fentanyl 100 mcg IV and Versed 10 mg IV TOPICAL ANESTHETIC: Cetacaine Spray DESCRIPTION OF PROCEDURE: After the risks benefits and alternatives of the procedure were thoroughly explained, informed consent was obtained.  The Pentax Gastroscope [id number] endoscope was introduced through the mouth and advanced to the third portion of the duodenum. Without limitations.  The instrument was slowly withdrawn as the mucosa was fully examined.    EXAM:The upper, middle and distal third of the esophagus were carefully inspected and no abnormalities were noted.  The z-line was well seen at the GEJ.  The endoscope was pushed into the fundus which was normal including a retroflexed view. The gastric mucosa was diffusely atrophic. Otherwise,  The antrum, gastric body, first, second, and third part of the duodenum were unremarkable. Retroflexed views revealed no abnormalities.     The scope was then withdrawn from the patient and the procedure completed.  COMPLICATIONS: There were no complications. ENDOSCOPIC IMPRESSION: 1. Atrophic gastric mucosa. Rule out B12 deficiency 2. Otherwise Normal EGD  RECOMMENDATIONS: 1. B12 and folate levels 2. Transfused 2 clinically desired hemoglobin 3.Capsule endoscopy as outpatient to complete GI workup (our office will arrange) 4. If capsule endoscopy negative, recommend formal hematology evaluation as recurrence of anemia is out of proportion to  GI bleeding. If hematology workup or to be negative, then consider hemorrhoidectomy as previously recommended by Dr. Russella Dar. Discussed with patient and husband. Will sign off.  REPEAT EXAM:  eSigned:  Roxy Cedar, MD 02/11/2013 3:40 PM  [id number] Ricki Miller, MD, Claudette Head, MD, and The Patient

## 2013-02-11 NOTE — Progress Notes (Deleted)
[facility] Consult: 8:31 AM 02/11/2013  LOS: 1 day    Referring Provider: Crista Curb  Primary Care Physician:  Juline Patch, MD Primary Gastroenterologist:  Dr. Russella Dar as of 01/04/2013, previously Dr Virginia Rochester    Reason for Consultation:  Recurrent anemia and heme + stool.    HPI: Marisa [name] is a 72 y.o. female.  Marisa [name] is a 72 y.o. female.  RA treated with chronic prednisone and Arava. Type 2 IDDM.  Regular narcotics for MS pain.  Anemia of chronic disease. Hx IBS-C, esophageal stricture, adenomatous colon polyp in 2005.  Takes Nexium BID. Has chronic constipation.  S/p 09/2011 surgical drainage of MRSA rectal abcess.  Colonoscopy on 01/06/2013 for accelerating frequency hematochezia and Hgb of 4.6 (c/w 11.2 in 05/2012).  Colonoscopy revealed moderated sized internal and external hemorrhoids and diverticulosis.  She did not have EGD.  She received 2 units PRBCs and Hgb 8.2 at discharge 9/25. Low iron and low normal ferritin.  Had been taking Integra plus iron supplements PTA and this was continued.   Readmitted yesterday with Hgb of 5.9. Progressive weakness, fell a couple of times.  + dyspnea and dizziness but not syncope.  Having ongoing hematochezia 2 x weekly when she has BMs.  Some mention in notes of dark, diarrheal stool last week that pt not endorsing.  She does have nausea a few times a month, less frequently vomits.  Smell of food can trigger the nausea.  Emesis not bloody or dark, just produces partially digested food.   Transfused 2 units PRBCs. Hgb back to 9.7 this AM.     Past Medical History  Diagnosis Date  . Diabetic ketoacidosis 11/2010  . Myocardial infarction     Chest pain s/p normal cath in 08/2004 then NSTEMI during 11/2010 admission, negative Myoview  . Diabetes mellitus type 2, insulin dependent     initial diagnoses 11/2008  . GERD (gastroesophageal reflux disease)   . Esophageal stricture 06/2004     Dilation 06/2004  . Hypertension   . Supraventricular tachycardia   . history of  pericarditis 12/2002  . Pericardial effusion   . Rheumatoid arthritis(714.0)     On MTX and chronic steroids  . Osteoarthritis   . Asthma     Home 3L O2   . Seizure 11/2008  . Rectal bleeding 11/2010    Large hemorrhoids  . Chronic anemia   . History of blood transfusion     Past Surgical History  Procedure Laterality Date  . Cholecystectomy    . Abdominal hysterectomy    . Incise and drain abcess  09/2011    I&D of peri-rectal abcess per Dr Abbey Chatters.   . Colonoscopy N/A 01/06/2013    Procedure: COLONOSCOPY;  Surgeon: [name], MD;  Location: [facility] ENDOSCOPY;  Service: Endoscopy;  Laterality: N/A;    Prior to Admission medications   Medication Sig Start Date End Date Taking? Authorizing Provider  albuterol (PROVENTIL) (5 MG/ML) 0.5% nebulizer solution Take 2.5 mg by nebulization every 6 (six) hours as needed for wheezing or shortness of breath. 10/08/11  Yes Nishant Dhungel, MD  aspirin EC 81 MG tablet Take 81 mg by mouth daily.   Yes Historical Provider, MD  Cholecalciferol (VITAMIN D) 2000 UNITS CAPS Take 2,000 capsules by mouth daily.   Yes Historical Provider, MD  Coenzyme Q10 (CO Q 10) 100 MG CAPS Take 100 mg by mouth daily.   Yes Historical Provider, MD  esomeprazole (NEXIUM) 40 MG capsule Take 40 mg  by mouth 2 (two) times daily.    Yes Historical Provider, MD  estrogens, conjugated, (PREMARIN) 0.625 MG tablet Take 0.625 mg by mouth daily.    Yes Historical Provider, MD  FeFum-FePoly-FA-B Cmp-C-Biot (INTEGRA PLUS) CAPS Take 1 capsule by mouth daily.   Yes Historical Provider, MD  folic acid (FOLVITE) 1 MG tablet Take 1 mg by mouth daily.     Yes Historical Provider, MD  HYDROcodone-acetaminophen (NORCO/VICODIN) 5-325 MG per tablet Take 2 tablets by mouth every 6 (six) hours as needed for pain.   Yes Historical Provider, MD  insulin aspart (NOVOLOG FLEXPEN) 100 UNIT/ML injection Inject 3-12  Units into the skin 3 (three) times daily before meals. Sliding scale   Yes Historical Provider, MD  insulin detemir (LEVEMIR FLEXPEN) 100 UNIT/ML injection Inject 22 Units into the skin daily at 12 noon.    Yes Historical Provider, MD  leflunomide (ARAVA) 20 MG tablet Take 20 mg by mouth daily.   Yes Historical Provider, MD  levalbuterol [facility] HFA) 45 MCG/ACT inhaler Inhale 1-2 puffs into the lungs every 4 (four) hours as needed for wheezing.   Yes Historical Provider, MD  lisinopril (PRINIVIL,ZESTRIL) 20 MG tablet Take 20 mg by mouth daily.   Yes Historical Provider, MD  LORazepam (ATIVAN) 0.5 MG tablet Take 0.5 mg by mouth every 8 (eight) hours.   Yes Historical Provider, MD  metoprolol (TOPROL XL) 100 MG 24 hr tablet Take 100 mg by mouth daily.    Yes Historical Provider, MD  Multiple Vitamin (MULTIVITAMINS PO) Take 1 tablet by mouth. Daily.    Yes Historical Provider, MD  phenylephrine-shark liver oil-mineral oil-petrolatum (PREPARATION H) 0.25-3-14-71.9 % rectal ointment Place 1 application rectally 2 (two) times daily as needed for hemorrhoids.   Yes Historical Provider, MD  potassium chloride SA (K-DUR,KLOR-CON) 20 MEQ tablet Take 20 mEq by mouth 2 (two) times daily.    Yes Historical Provider, MD  predniSONE (DELTASONE) 10 MG tablet Take 10 mg by mouth daily.   Yes Historical Provider, MD  pregabalin (LYRICA) 75 MG capsule Take 75-150 mg by mouth 2 (two) times daily. 1 IN THE AM AND 2 IN THE PM   Yes Historical Provider, MD  promethazine (PHENERGAN) 12.5 MG tablet Take 12.5 mg by mouth every 6 (six) hours as needed for nausea.   Yes Historical Provider, MD  vitamin A 8000 UNIT capsule Take 8,000 Units by mouth daily.   Yes Historical Provider, MD  docusate sodium (COLACE) 100 MG capsule Take 100 mg by mouth daily as needed for constipation.    Historical Provider, MD    Scheduled Meds: . insulin detemir  5 Units Subcutaneous QHS  . metoprolol tartrate  25 mg Oral BID  . pantoprazole   40 mg Oral BID  . predniSONE  10 mg Oral Q breakfast  . pregabalin  75 mg Oral BID   Infusions:   PRN Meds: acetaminophen, acetaminophen, HYDROcodone-acetaminophen, levalbuterol, LORazepam, metoprolol, ondansetron (ZOFRAN) IV, ondansetron   Allergies as of 02/10/2013 - Review Complete 02/10/2013  Allergen Reaction Noted  . Demerol [meperidine] Other (See Comments) 09/29/2011  . Penicillins Other (See Comments) 09/29/2011    History reviewed. No pertinent family history.  History   Social History  . Marital Status: Married    Spouse Name: N/A    Number of Children: N/A  . Years of Education: N/A   Occupational History  . Not on file.   Social History Main Topics  . Smoking status: Never Smoker   .  Smokeless tobacco: Never Used  . Alcohol Use: No  . Drug Use: No  . Sexual Activity: Not on file   Other Topics Concern  . Not on file   Social History Narrative   Pt lives at home with her husband and has a son and daughter. She is still ambulatory occasionally using a cane or walker. No toxic habits.     REVIEW OF SYSTEMS: Constitutional: Generally increased fatigue.  Weight stable.  ENT: No nose bleeds  Pulm: course of Zithromax for URI in Sep 2014. No cough at present.  CV: No chest pain, no palpitions but does feel rapid rate occasionally. No pedal edema  GU: Frequency and urgency from "old age" bladder issues  GI: Per HPI  Heme: Tolerates po Iron, no recall of treatment with parenteral iron.  Transfusions: yes in sep 2014 and previously   Neuro:  Balance is compromised. Uses combo of cane, walker and wheelchair  Derm: Some pruritic dry skin along upper sternum. No sores. Generally dry skin.  Endocrine: No excessive thirst or urination. No sweats. Sugars range 120s-170s, generally not above 200.  Immunization:  Up to date flu shot.  Travel: yes. Not outside [place].     PHYSICAL EXAM: Vital signs in last 24 hours: Filed Vitals:   02/11/13 0624  BP: 178/87   Pulse: 92  Temp: 97.4 F (36.3 C)  Resp:    Wt Readings from Last 3 Encounters:  02/10/13 56.564 kg (124 lb 11.2 oz)  01/07/13 55.52 kg (122 lb 6.4 oz)  01/07/13 55.52 kg (122 lb 6.4 oz)   General: looks somewhat chronically but not acutely ill.  Comfortable and alert Head:  No asymmetry or swellilng  Eyes:  No icterus or pallor Ears:  Not HOH  Nose:  No congestion or drainage Mouth:  Clear, moist, pink  Oral MM.  Native teeth in good repair.  Neck:  No JVD or mass Lungs:  Clear but diminished.  No dyspnea or cough Heart: RRR Abdomen:  Soft, NT, ND.  No mass, no HSM, no bruits.   Rectal: large, cauliflower-like external and palpable internal hemorrhoids. Long, healed incisional scar in left gluteal fold superior to rectum.   No acitve bleeding at present   Musc/Skeltl: RA associated deformity in fingers/hands Extremities:  3 + pedal pulses bil.  Feet warm.  No peripheral edema  Neurologic:  No tremor.  No limb weakness.  Oriented x 3.  Good historian Skin:  No rash.  A few small purpura on UE Tattoos:  none Nodes:  No cervical adenopathy   Psych:  Pleasant, not depressed or anxious.  Engaged.   Intake/Output from previous day: 10/29 0701 - 10/30 0700 In: 590 [I.V.:250; Blood:340] Out: -  Intake/Output this shift:    LAB RESULTS:  Recent Labs  02/10/13 1733 02/11/13 0406  WBC 8.6  --   HGB 5.9* 9.7*  HCT 18.9* 29.2*  PLT 342  --    BMET Lab Results  Component Value Date   NA 136 02/10/2013   NA 134* 01/07/2013   NA 137 01/05/2013   K 4.3 02/10/2013   K 3.6 01/07/2013   K 3.8 01/05/2013   CL 96 02/10/2013   CL 99 01/07/2013   CL 98 01/05/2013   CO2 30 02/10/2013   CO2 26 01/07/2013   CO2 29 01/05/2013   GLUCOSE 78 02/10/2013   GLUCOSE 288* 01/07/2013   GLUCOSE 156* 01/05/2013   BUN 13 02/10/2013   BUN 14 01/07/2013  BUN 14 01/05/2013   CREATININE 1.16* 02/10/2013   CREATININE 1.34* 01/07/2013   CREATININE 1.00 01/05/2013   CALCIUM 8.7 02/10/2013   CALCIUM  8.4 01/07/2013   CALCIUM 8.6 01/05/2013   LFT  Recent Labs  02/10/13 1733  PROT 5.4*  ALBUMIN 2.6*  AST 16  ALT 9  ALKPHOS 111  BILITOT 0.1*   PT/INR Lab Results  Component Value Date   INR 0.90 01/05/2013   INR 0.88 01/04/2013    RADIOLOGY STUDIES: No results found.  ENDOSCOPIC STUDIES: 01/06/2013  Colonoscopy ENDOSCOPIC IMPRESSION:  1. Mild diverticulosis was noted in the sigmoid colon  2. Moderate internal and external hemorrrhoids  RECOMMENDATIONS:  1. High fiber diet with liberal fluid intake.  2. Given your age, comordities and absence of polyps on this exam  you will not need another colonoscopy for colon cancer screening or  polyp surveillance.  3. Treat hemorrhoids with HC supp and HC creams. If symptoms  persist proceed with surgical referral  02/2007 EGD  For abdominal pain, dysphagia.  No esophageal stricture but was empirically dilated.  Gastritis noted. Pathology: Mild chronic gastritis. No H Pylori present..  06/2004 EGD  For dysphagia.  Esophageal stricture dilated   09/2003 Colonoscopy  Single adenomatous polyp and internal hemorrhoids. Due for repeat colon 09/2008.   04/1991 ERCP  normal post cholecystectomy cholangiogram and a normal ampulla.  Air bubble in CBD   IMPRESSION:   *  Recurrent anemia with ongoing episodes of hematochezia.  Questionable hx of dark, watery stools last week but not hematemesis.  Intermittent nausea, infrequent vomiting.  Suspect this is all from ongoing hemorrhoidal  Bleeding given pattern of occurrence only 2 x weekly when she moves her bowels.  Doubt PUD given chronic BID Protonix.  Rule out cameron erosion associated blood loss.  Rule out AVMs.  *  IDDM.  Type 2 *   Rheumatoid arthritis on Prednisone and Arava.  Narcotics used to control MS pain.  * Chronic constipation. Chronic narcotics and daily po Iron contributing to this.  * History of dysphagia with esophageal dilatations in past. Not recently bothered by  dysphagia.     PLAN:     *  EGD today, 3 PM hopefully if no emergency cases interfere, if so postpone til tomorrow.     Jennye Moccasin  02/11/2013, 8:31 AM Pager: [phone]  GI ATTENDING  History, laboratories, x-rays, prior endoscopy reports reviewed. Patient personally seen and examined. Patient's husband in room. Agree with H&P as outlined above. The patient presents with recurrent anemia. This has been, presumably, on the basis of hemorrhoidal bleeding. Recent colonoscopy as outlined. Question of dark stools. At this point, the GI workup should be expanded to include upper endoscopy (today) and possible capsule endoscopy. If these are not definitive, then I would recommend a formal hematology evaluation as her anemia seems out of portion to GI blood loss as reported.The nature of the procedure, as well as the risks, benefits, and alternatives were carefully and thoroughly reviewed with the patient. Ample time for discussion and questions allowed. The patient understood, was satisfied, and agreed to proceed.  Wilhemina Bonito. Eda Keys., M.D. [facility] Division of Gastroenterology

## 2013-02-11 NOTE — Telephone Encounter (Signed)
Patient is scheduled for capsule endoscopy for 02/19/13 8:00.  Jennye Moccasin PA will place instructions on the chart to the patient at discharge.  I have faxed written information and instructions to the patient at [facility] 6[facility] [phone].  I have mailed her a letter for her follow up office visit for 03/04/13 11:15

## 2013-02-11 NOTE — Progress Notes (Signed)
Subjective: Had small amount of BRBPR today.  Started feeling dyspneic last night after 2nd unit PRBC.  Wears chronic O2 at home. No n/v  Objective: Vital signs in last 24 hours: Temp:  [97.4 F (36.3 C)-98.7 F (37.1 C)] 97.8 F (36.6 C) (10/30 1002) Pulse Rate:  [92-111] 111 (10/30 1002) Resp:  [14-24] 18 (10/30 1002) BP: (147-200)/(60-88) 158/74 mmHg (10/30 1002) SpO2:  [93 %-100 %] 100 % (10/30 1002) FiO2 (%):  [2.5 %] 2.5 % (10/30 1002) Weight:  [55.339 kg (122 lb)-56.564 kg (124 lb 11.2 oz)] 56.564 kg (124 lb 11.2 oz) (10/29 1952) Weight change:  Last BM Date: 02/10/13  Intake/Output from previous day: 10/29 0701 - 10/30 0700 In: 590 [I.V.:250; Blood:340] Out: -  Intake/Output this shift:   Gen: lying flat. Slightly tachypneic. Lungs: diminished throughout. CV RRR Abd: s, nt,nd Ext: no CCE  Lab Results:  Recent Labs  02/10/13 1733 02/11/13 0406  WBC 8.6  --   HGB 5.9* 9.7*  HCT 18.9* 29.2*  PLT 342  --    BMET  Recent Labs  02/10/13 1733  NA 136  K 4.3  CL 96  CO2 30  GLUCOSE 78  BUN 13  CREATININE 1.16*  CALCIUM 8.7    Studies/Results: No results found.  Medications:  Scheduled Meds: . furosemide  40 mg Intravenous Once  . insulin aspart  0-5 Units Subcutaneous QHS  . insulin aspart  0-9 Units Subcutaneous TID WC  . insulin detemir  5 Units Subcutaneous QHS  . metoprolol tartrate  25 mg Oral BID  . pantoprazole  40 mg Oral BID  . predniSONE  10 mg Oral Q breakfast  . pregabalin  75 mg Oral BID   Continuous Infusions:  PRN Meds:.acetaminophen, acetaminophen, HYDROcodone-acetaminophen, levalbuterol, LORazepam, metoprolol, ondansetron (ZOFRAN) IV, ondansetron  Assessment/Plan: Principal Problem:   Acute blood loss anemia: improved after transfusion.  For EGD today Dyspnea: sats fine and lungs clear.  Will give a dose of lasix and xopenex. Active Problems:   ANXIETY   HEMORRHOIDS, INTERNAL   GERD   GASTRITIS   Rheumatoid  arthritis(714.0)   Diabetes mellitus type 2, insulin dependent: SSI   Chronic anemia   Iron deficiency   Gastroenteritis: no further N/V/D   Chronic respiratory failure: on home O2  Appreciate GI   LOS: 1 day   Marisa [name] L 02/11/2013, 12:16 PM

## 2013-02-11 NOTE — Consult Note (Signed)
[facility] Consult: 3:28 PM 02/11/2013  LOS: 1 day    Referring Provider: Crista Curb  Primary Care Physician:  Juline Patch, MD Primary Gastroenterologist:  Dr. Russella Dar as of 01/04/2013, previously Dr Virginia Rochester    Reason for Consultation:  Recurrent anemia and heme + stool.    HPI: Marisa [name] is a 72 y.o. female.  Marisa [name] is a 72 y.o. female.  RA treated with chronic prednisone and Arava. Type 2 IDDM.  Regular narcotics for MS pain.  Anemia of chronic disease. Hx IBS-C, esophageal stricture, adenomatous colon polyp in 2005.  Takes Nexium BID. Has chronic constipation.  S/p 09/2011 surgical drainage of MRSA rectal abcess.  Colonoscopy on 01/06/2013 for accelerating frequency hematochezia and Hgb of 4.6 (c/w 11.2 in 05/2012).  Colonoscopy revealed moderated sized internal and external hemorrhoids and diverticulosis.  She did not have EGD.  She received 2 units PRBCs and Hgb 8.2 at discharge 9/25. Low iron and low normal ferritin.  Had been taking Integra plus iron supplements PTA and this was continued.   Readmitted yesterday with Hgb of 5.9. Progressive weakness, fell a couple of times.  + dyspnea and dizziness but not syncope.  Having ongoing hematochezia 2 x weekly when she has BMs.  Some mention in notes of dark, diarrheal stool last week that pt not endorsing.  She does have nausea a few times a month, less frequently vomits.  Smell of food can trigger the nausea.  Emesis not bloody or dark, just produces partially digested food.   Transfused 2 units PRBCs. Hgb back to 9.7 this AM.     Past Medical History  Diagnosis Date  . Diabetic ketoacidosis 11/2010  . Myocardial infarction     Chest pain s/p normal cath in 08/2004 then NSTEMI during 11/2010 admission, negative Myoview  . Diabetes mellitus type 2, insulin dependent     initial diagnoses 11/2008  . GERD (gastroesophageal reflux disease)   . Esophageal stricture 06/2004   Dilation 06/2004  . Hypertension   . Supraventricular tachycardia   . history of  pericarditis 12/2002  . Pericardial effusion   . Rheumatoid arthritis(714.0)     On MTX and chronic steroids  . Osteoarthritis   . Asthma     Home 3L O2   . Seizure 11/2008  . Rectal bleeding 11/2010    Large hemorrhoids  . Chronic anemia   . History of blood transfusion     Past Surgical History  Procedure Laterality Date  . Cholecystectomy    . Abdominal hysterectomy    . Incise and drain abcess  09/2011    I&D of peri-rectal abcess per Dr Abbey Chatters.   . Colonoscopy N/A 01/06/2013    Procedure: COLONOSCOPY;  Surgeon: [name], MD;  Location: [facility] ENDOSCOPY;  Service: Endoscopy;  Laterality: N/A;    Prior to Admission medications   Medication Sig Start Date End Date Taking? Authorizing Provider  albuterol (PROVENTIL) (5 MG/ML) 0.5% nebulizer solution Take 2.5 mg by nebulization every 6 (six) hours as needed for wheezing or shortness of breath. 10/08/11  Yes Nishant Dhungel, MD  aspirin EC 81 MG tablet Take 81 mg by mouth daily.   Yes Historical Provider, MD  Cholecalciferol (VITAMIN D) 2000 UNITS CAPS Take 2,000 capsules by mouth daily.   Yes Historical Provider, MD  Coenzyme Q10 (CO Q 10) 100 MG CAPS Take 100 mg by mouth daily.   Yes Historical Provider, MD  esomeprazole (NEXIUM) 40 MG capsule Take 40 mg by mouth  2 (two) times daily.    Yes Historical Provider, MD  estrogens, conjugated, (PREMARIN) 0.625 MG tablet Take 0.625 mg by mouth daily.    Yes Historical Provider, MD  FeFum-FePoly-FA-B Cmp-C-Biot (INTEGRA PLUS) CAPS Take 1 capsule by mouth daily.   Yes Historical Provider, MD  folic acid (FOLVITE) 1 MG tablet Take 1 mg by mouth daily.     Yes Historical Provider, MD  HYDROcodone-acetaminophen (NORCO/VICODIN) 5-325 MG per tablet Take 2 tablets by mouth every 6 (six) hours as needed for pain.   Yes Historical Provider, MD  insulin aspart (NOVOLOG FLEXPEN) 100 UNIT/ML injection Inject 3-12  Units into the skin 3 (three) times daily before meals. Sliding scale   Yes Historical Provider, MD  insulin detemir (LEVEMIR FLEXPEN) 100 UNIT/ML injection Inject 22 Units into the skin daily at 12 noon.    Yes Historical Provider, MD  leflunomide (ARAVA) 20 MG tablet Take 20 mg by mouth daily.   Yes Historical Provider, MD  levalbuterol [facility] HFA) 45 MCG/ACT inhaler Inhale 1-2 puffs into the lungs every 4 (four) hours as needed for wheezing.   Yes Historical Provider, MD  lisinopril (PRINIVIL,ZESTRIL) 20 MG tablet Take 20 mg by mouth daily.   Yes Historical Provider, MD  LORazepam (ATIVAN) 0.5 MG tablet Take 0.5 mg by mouth every 8 (eight) hours.   Yes Historical Provider, MD  metoprolol (TOPROL XL) 100 MG 24 hr tablet Take 100 mg by mouth daily.    Yes Historical Provider, MD  Multiple Vitamin (MULTIVITAMINS PO) Take 1 tablet by mouth. Daily.    Yes Historical Provider, MD  phenylephrine-shark liver oil-mineral oil-petrolatum (PREPARATION H) 0.25-3-14-71.9 % rectal ointment Place 1 application rectally 2 (two) times daily as needed for hemorrhoids.   Yes Historical Provider, MD  potassium chloride SA (K-DUR,KLOR-CON) 20 MEQ tablet Take 20 mEq by mouth 2 (two) times daily.    Yes Historical Provider, MD  predniSONE (DELTASONE) 10 MG tablet Take 10 mg by mouth daily.   Yes Historical Provider, MD  pregabalin (LYRICA) 75 MG capsule Take 75-150 mg by mouth 2 (two) times daily. 1 IN THE AM AND 2 IN THE PM   Yes Historical Provider, MD  promethazine (PHENERGAN) 12.5 MG tablet Take 12.5 mg by mouth every 6 (six) hours as needed for nausea.   Yes Historical Provider, MD  vitamin A 8000 UNIT capsule Take 8,000 Units by mouth daily.   Yes Historical Provider, MD  docusate sodium (COLACE) 100 MG capsule Take 100 mg by mouth daily as needed for constipation.    Historical Provider, MD    Scheduled Meds: . [MAR HOLD] insulin aspart  0-5 Units Subcutaneous QHS  . [MAR HOLD] insulin aspart  0-9 Units  Subcutaneous TID WC  . [MAR HOLD] insulin detemir  5 Units Subcutaneous QHS  . [MAR HOLD] lisinopril  20 mg Oral Daily  . [facility] HOLD] metoprolol succinate  100 mg Oral Daily  . [facility] HOLD] metoprolol tartrate  25 mg Oral BID  . [MAR HOLD] pantoprazole  40 mg Oral BID  . [MAR HOLD] predniSONE  10 mg Oral Q breakfast  . [MAR HOLD] pregabalin  75 mg Oral BID   Infusions:   PRN Meds: [MAR HOLD] acetaminophen, [MAR HOLD] acetaminophen, butamben-tetracaine-benzocaine, fentaNYL, [MAR HOLD] HYDROcodone-acetaminophen, [MAR HOLD] levalbuterol, [MAR HOLD] LORazepam, [MAR HOLD] metoprolol, midazolam, [MAR HOLD] ondansetron (ZOFRAN) IV, [MAR HOLD] ondansetron   Allergies as of 02/10/2013 - Review Complete 02/10/2013  Allergen Reaction Noted  . Demerol [meperidine] Other (See Comments)  09/29/2011  . Penicillins Other (See Comments) 09/29/2011    History reviewed. No pertinent family history.  History   Social History  . Marital Status: Married    Spouse Name: N/A    Number of Children: N/A  . Years of Education: N/A   Occupational History  . Not on file.   Social History Main Topics  . Smoking status: Never Smoker   . Smokeless tobacco: Never Used  . Alcohol Use: No  . Drug Use: No  . Sexual Activity: Not on file   Other Topics Concern  . Not on file   Social History Narrative   Pt lives at home with her husband and has a son and daughter. She is still ambulatory occasionally using a cane or walker. No toxic habits.     REVIEW OF SYSTEMS: Constitutional: Generally increased fatigue.  Weight stable.  ENT: No nose bleeds  Pulm: course of Zithromax for URI in Sep 2014. No cough at present.  CV: No chest pain, no palpitions but does feel rapid rate occasionally. No pedal edema  GU: Frequency and urgency from "old age" bladder issues  GI: Per HPI  Heme: Tolerates po Iron, no recall of treatment with parenteral iron.  Transfusions: yes in sep 2014 and previously   Neuro:  Balance  is compromised. Uses combo of cane, walker and wheelchair  Derm: Some pruritic dry skin along upper sternum. No sores. Generally dry skin.  Endocrine: No excessive thirst or urination. No sweats. Sugars range 120s-170s, generally not above 200.  Immunization:  Up to date flu shot.  Travel: yes. Not outside [place].     PHYSICAL EXAM: Vital signs in last 24 hours: Filed Vitals:   02/11/13 1520  BP: 125/80  Pulse:   Temp:   Resp: 37   Wt Readings from Last 3 Encounters:  02/10/13 56.564 kg (124 lb 11.2 oz)  02/10/13 56.564 kg (124 lb 11.2 oz)  01/07/13 55.52 kg (122 lb 6.4 oz)   General: looks somewhat chronically but not acutely ill.  Comfortable and alert Head:  No asymmetry or swellilng  Eyes:  No icterus or pallor Ears:  Not HOH  Nose:  No congestion or drainage Mouth:  Clear, moist, pink  Oral MM.  Native teeth in good repair.  Neck:  No JVD or mass Lungs:  Clear but diminished.  No dyspnea or cough Heart: RRR Abdomen:  Soft, NT, ND.  No mass, no HSM, no bruits.   Rectal: large, cauliflower-like external and palpable internal hemorrhoids. Long, healed incisional scar in left gluteal fold superior to rectum.   No acitve bleeding at present   Musc/Skeltl: RA associated deformity in fingers/hands Extremities:  3 + pedal pulses bil.  Feet warm.  No peripheral edema  Neurologic:  No tremor.  No limb weakness.  Oriented x 3.  Good historian Skin:  No rash.  A few small purpura on UE Tattoos:  none Nodes:  No cervical adenopathy   Psych:  Pleasant, not depressed or anxious.  Engaged.   Intake/Output from previous day: 10/29 0701 - 10/30 0700 In: 590 [I.V.:250; Blood:340] Out: -  Intake/Output this shift:    LAB RESULTS:  Recent Labs  02/10/13 1733 02/11/13 0406  WBC 8.6  --   HGB 5.9* 9.7*  HCT 18.9* 29.2*  PLT 342  --    BMET Lab Results  Component Value Date   NA 136 02/10/2013   NA 134* 01/07/2013   NA 137 01/05/2013   K 4.3  02/10/2013   K 3.6 01/07/2013   K  3.8 01/05/2013   CL 96 02/10/2013   CL 99 01/07/2013   CL 98 01/05/2013   CO2 30 02/10/2013   CO2 26 01/07/2013   CO2 29 01/05/2013   GLUCOSE 78 02/10/2013   GLUCOSE 288* 01/07/2013   GLUCOSE 156* 01/05/2013   BUN 13 02/10/2013   BUN 14 01/07/2013   BUN 14 01/05/2013   CREATININE 1.16* 02/10/2013   CREATININE 1.34* 01/07/2013   CREATININE 1.00 01/05/2013   CALCIUM 8.7 02/10/2013   CALCIUM 8.4 01/07/2013   CALCIUM 8.6 01/05/2013   LFT  Recent Labs  02/10/13 1733  PROT 5.4*  ALBUMIN 2.6*  AST 16  ALT 9  ALKPHOS 111  BILITOT 0.1*   PT/INR Lab Results  Component Value Date   INR 0.90 01/05/2013   INR 0.88 01/04/2013    RADIOLOGY STUDIES: No results found.  ENDOSCOPIC STUDIES: 01/06/2013  Colonoscopy ENDOSCOPIC IMPRESSION:  1. Mild diverticulosis was noted in the sigmoid colon  2. Moderate internal and external hemorrrhoids  RECOMMENDATIONS:  1. High fiber diet with liberal fluid intake.  2. Given your age, comordities and absence of polyps on this exam  you will not need another colonoscopy for colon cancer screening or  polyp surveillance.  3. Treat hemorrhoids with HC supp and HC creams. If symptoms  persist proceed with surgical referral  02/2007 EGD  For abdominal pain, dysphagia.  No esophageal stricture but was empirically dilated.  Gastritis noted. Pathology: Mild chronic gastritis. No H Pylori present..  06/2004 EGD  For dysphagia.  Esophageal stricture dilated   09/2003 Colonoscopy  Single adenomatous polyp and internal hemorrhoids. Due for repeat colon 09/2008.   04/1991 ERCP  normal post cholecystectomy cholangiogram and a normal ampulla.  Air bubble in CBD   IMPRESSION:   *  Recurrent anemia with ongoing episodes of hematochezia.  Questionable hx of dark, watery stools last week but not hematemesis.  Intermittent nausea, infrequent vomiting.  Suspect this is all from ongoing hemorrhoidal  Bleeding given pattern of occurrence only 2 x weekly when she  moves her bowels.  Doubt PUD given chronic BID Protonix.  Rule out cameron erosion associated blood loss.  Rule out AVMs.  *  IDDM.  Type 2 *   Rheumatoid arthritis on Prednisone and Arava.  Narcotics used to control MS pain.  * Chronic constipation. Chronic narcotics and daily po Iron contributing to this.  * History of dysphagia with esophageal dilatations in past. Not recently bothered by dysphagia.     PLAN:     *  EGD today, 3 PM hopefully if no emergency cases interfere, if so postpone til tomorrow.     Jennye Moccasin  02/11/2013, 3:28 PM Pager: [phone]  GI ATTENDING  History, laboratories, x-rays, prior endoscopy reports reviewed. Patient personally seen and examined. Patient's husband in room. Agree with H&P as outlined above. The patient presents with recurrent anemia. This has been, presumably, on the basis of hemorrhoidal bleeding. Recent colonoscopy as outlined. Question of dark stools. At this point, the GI workup should be expanded to include upper endoscopy (today) and possible capsule endoscopy. If these are not definitive, then I would recommend a formal hematology evaluation as her anemia seems out of portion to GI blood loss as reported.The nature of the procedure, as well as the risks, benefits, and alternatives were carefully and thoroughly reviewed with the patient. Ample time for discussion and questions allowed. The patient understood, was satisfied, and agreed  to proceed.  Wilhemina Bonito. Eda Keys., M.D. [facility] Division of Gastroenterology

## 2013-02-11 NOTE — Telephone Encounter (Signed)
Message copied by Annett Fabian on Thu Feb 11, 2013  4:39 PM ------      Message from: Dianah Field      Created: Thu Feb 11, 2013  4:01 PM      Regarding: capsule egd, ROV       Hi Sheri,       Dr Marina Goodell would like pt to have outpt capsule endo and follow up with Dr Russella Dar.  Can you arrange these?  Pt likely to go home later today or tomorrow.             Thanks, Sarah.  ------

## 2013-02-12 ENCOUNTER — Encounter (HOSPITAL_COMMUNITY): Payer: Self-pay | Admitting: Internal Medicine

## 2013-02-12 DIAGNOSIS — F411 Generalized anxiety disorder: Secondary | ICD-10-CM | POA: Diagnosis not present

## 2013-02-12 DIAGNOSIS — D649 Anemia, unspecified: Secondary | ICD-10-CM | POA: Diagnosis not present

## 2013-02-12 DIAGNOSIS — K648 Other hemorrhoids: Secondary | ICD-10-CM

## 2013-02-12 DIAGNOSIS — K2940 Chronic atrophic gastritis without bleeding: Secondary | ICD-10-CM | POA: Diagnosis not present

## 2013-02-12 DIAGNOSIS — D62 Acute posthemorrhagic anemia: Secondary | ICD-10-CM | POA: Diagnosis not present

## 2013-02-12 LAB — GLUCOSE, CAPILLARY
Glucose-Capillary: 61 mg/dL — ABNORMAL LOW (ref 70–99)
Glucose-Capillary: 76 mg/dL (ref 70–99)
Glucose-Capillary: 267 mg/dL — ABNORMAL HIGH (ref 70–99)

## 2013-02-12 LAB — VITAMIN B12: Vitamin B-12: 901 pg/mL (ref 211–911)

## 2013-02-12 LAB — HEMOGLOBIN AND HEMATOCRIT, BLOOD: Hemoglobin: 10 g/dL — ABNORMAL LOW (ref 12.0–15.0)

## 2013-02-12 NOTE — Discharge Summary (Signed)
Physician Discharge Summary  Marisa [name]:[id number] DOB: February 18, 1941 DOA: 02/10/2013  PCP: Juline Patch, MD  Admit date: 02/10/2013 Discharge date: 02/12/2013  Time spent: greater than 30 minutes  Recommendations for Outpatient Follow-up:  1. Capsule endoscopy  Discharge Diagnoses:  Principal Problem:   Acute blood loss anemia Active Problems:   ANXIETY   HYPERTENSION   HEMORRHOIDS, INTERNAL with bleeding   GERD   Atrophic gastric mucosa   Rheumatoid arthritis(714.0)   Diabetes mellitus type 2, insulin dependent   Chronic anemia   Iron deficiency   Gastroenteritis, viral   Chronic respiratory failure   Dyspnea  Discharge Condition: stable  Filed Weights   02/10/13 1615 02/10/13 1952  Weight: 55.339 kg (122 lb) 56.564 kg (124 lb 11.2 oz)    History of present illness:  72 y.o. female sent to ED from PCP's office with hgb 5. Has had hemorroidal bleeding for several days. "about a spoonful" a few times daily, usually with defecation. Had admission and colonoscopy for anemia and rectal bleeding a month ago. Colonoscopy by Dr. Russella Dar showed mild diverticulosis, moderate internal and external hemorrhoids. hgb 8 at discharge after transfusion. Anemia panel showed severe iron deficiency. Several days ago had a "stomach bug", vomited nonbloody several times, with several episodes of "dark" diarrhea. Not tarry. Last EGD in 2009 showed gastritis. Has been weak. Fell x2. Usually uses cane or walker. Dyspneic and light headed with standing. Getting first unit PRBC currently. On asa and chronic prednisone. On PPI as outpt.  Hospital Course:  Admitted to medsurg. Transfused 2 units PRBC.  GI consulted for anemia out of proportion to intermittent hemorrhoidal bleeding.  EGD showed no source of bleed, but showed atrophic gastric mucosa.  Outpatient capusule endoscopy has been arranged.  Patient had no vomiting or diarrhea while here.  She did have some dyspnea which responded to lasix  and xopenex.  She has chronic hypoxia, and nasal cannula oxygen was continued.  Procedures:  EGD which showed atrophic gastric mucosa and no bleeding  Consultations:  [facility]  Discharge Exam: Filed Vitals:   02/12/13 0518  BP: 140/66  Pulse: 106  Temp: 98.1 F (36.7 C)  Resp: 16    General: comfortable.  Breathing nonlabored Cardiovascular: RRR without MGR Respiratory: CTA wihtout WRR Abd: S, NT, ND Ext:  No CCE  Discharge Instructions  Discharge Orders   Future Appointments Provider Department Dept Phone   02/19/2013 8:00 AM [facility] [phone]   03/04/2013 11:15 AM [name], MD [facility] [phone]   Future Orders Complete By Expires   Diet Carb Modified  As directed    Increase activity slowly  As directed    Walk with assistance  As directed        Medication List    STOP taking these medications       aspirin EC 81 MG tablet     INTEGRA PLUS Caps      TAKE these medications       albuterol (5 MG/ML) 0.5% nebulizer solution  Commonly known as:  PROVENTIL  Take 2.5 mg by nebulization every 6 (six) hours as needed for wheezing or shortness of breath.     Co Q 10 100 MG Caps  Take 100 mg by mouth daily.     docusate sodium 100 MG capsule  Commonly known as:  COLACE  Take 100 mg by mouth daily as needed for constipation.     esomeprazole 40 MG capsule  Commonly known as:  NEXIUM  Take 40 mg by mouth 2 (two) times daily.     estrogens (conjugated) 0.625 MG tablet  Commonly known as:  PREMARIN  Take 0.625 mg by mouth daily.     folic acid 1 MG tablet  Commonly known as:  FOLVITE  Take 1 mg by mouth daily.     HYDROcodone-acetaminophen 5-325 MG per tablet  Commonly known as:  NORCO/VICODIN  Take 2 tablets by mouth every 6 (six) hours as needed for pain.     leflunomide 20 MG tablet  Commonly known as:  ARAVA  Take 20 mg by mouth daily.      levalbuterol 45 MCG/ACT inhaler  Commonly known as:  XOPENEX HFA  Inhale 1-2 puffs into the lungs every 4 (four) hours as needed for wheezing.     LEVEMIR FLEXPEN 100 UNIT/ML injection  Generic drug:  insulin detemir  Inject 22 Units into the skin daily at 12 noon.     lisinopril 20 MG tablet  Commonly known as:  PRINIVIL,ZESTRIL  Take 20 mg by mouth daily.     LORazepam 0.5 MG tablet  Commonly known as:  ATIVAN  Take 0.5 mg by mouth every 8 (eight) hours.     LYRICA 75 MG capsule  Generic drug:  pregabalin  Take 75-150 mg by mouth 2 (two) times daily. 1 IN THE AM AND 2 IN THE PM     MULTIVITAMINS PO  Take 1 tablet by mouth. Daily.     NOVOLOG FLEXPEN 100 UNIT/ML injection  Generic drug:  insulin aspart  Inject 3-12 Units into the skin 3 (three) times daily before meals. Sliding scale     potassium chloride SA 20 MEQ tablet  Commonly known as:  K-DUR,KLOR-CON  Take 20 mEq by mouth 2 (two) times daily.     predniSONE 10 MG tablet  Commonly known as:  DELTASONE  Take 10 mg by mouth daily.     PREPARATION H 0.25-3-14-71.9 % rectal ointment  Generic drug:  phenylephrine-shark liver oil-mineral oil-petrolatum  Place 1 application rectally 2 (two) times daily as needed for hemorrhoids.     promethazine 12.5 MG tablet  Commonly known as:  PHENERGAN  Take 12.5 mg by mouth every 6 (six) hours as needed for nausea.     TOPROL XL 100 MG 24 hr tablet  Generic drug:  metoprolol succinate  Take 100 mg by mouth daily.     vitamin A 8000 UNIT capsule  Take 8,000 Units by mouth daily.     Vitamin D 2000 UNITS Caps  Take 2,000 capsules by mouth daily.       Allergies  Allergen Reactions  . Demerol [Meperidine] Other (See Comments)    Drives me crazy   . Penicillins Other (See Comments)    Throat swelling       Follow-up Information   Follow up with Judie Petit T. Russella Dar, MD. (as instructed)    Specialty:  Gastroenterology   Contact information:   [address] [place] [address] [phone]        The results of significant diagnostics from this hospitalization (including imaging, microbiology, ancillary and laboratory) are listed below for reference.    Significant Diagnostic Studies: No results found.  Microbiology: No results found for this or any previous visit (from the past 240 hour(s)).   Labs: Basic Metabolic Panel:  Recent Labs Lab 02/10/13 1733  NA 136  K 4.3  CL 96  CO2 30  GLUCOSE  78  BUN 13  CREATININE 1.16*  CALCIUM 8.7   Liver Function Tests:  Recent Labs Lab 02/10/13 1733  AST 16  ALT 9  ALKPHOS 111  BILITOT 0.1*  PROT 5.4*  ALBUMIN 2.6*    Recent Labs Lab 02/10/13 1733  LIPASE 11   No results found for this basename: AMMONIA,  in the last 168 hours CBC:  Recent Labs Lab 02/10/13 1733 02/11/13 0406 02/12/13 0835  WBC 8.6  --   --   NEUTROABS 8.1*  --   --   HGB 5.9* 9.7* 10.0*  HCT 18.9* 29.2* 30.9*  MCV 84.0  --   --   PLT 342  --   --    Cardiac Enzymes: No results found for this basename: CKTOTAL, CKMB, CKMBINDEX, TROPONINI,  in the last 168 hours BNP: BNP (last 3 results)  Recent Labs  06/06/12 1000  PROBNP 405.9*   CBG:  Recent Labs Lab 02/11/13 2155 02/12/13 0150 02/12/13 0737 02/12/13 0750 02/12/13 0949  GLUCAP 285* 138* 61* 76 156*    Signed:  Suriyah Vergara L  [facility] 02/12/2013, 12:05 PM

## 2013-02-12 NOTE — Progress Notes (Signed)
Pt c/o feeling anxious, slightly short of breath, and hyperglycemic. Postprandial cbg is 156. RT called to give Xopenex treatment. O2 re-applied at 2.5L and sat 98%.Marisa [name]

## 2013-02-18 ENCOUNTER — Other Ambulatory Visit: Payer: Self-pay

## 2013-02-19 ENCOUNTER — Ambulatory Visit (INDEPENDENT_AMBULATORY_CARE_PROVIDER_SITE_OTHER): Payer: Medicare Other | Admitting: Gastroenterology

## 2013-02-19 ENCOUNTER — Telehealth: Payer: Self-pay | Admitting: Gastroenterology

## 2013-02-19 DIAGNOSIS — R195 Other fecal abnormalities: Secondary | ICD-10-CM | POA: Diagnosis not present

## 2013-02-19 DIAGNOSIS — K625 Hemorrhage of anus and rectum: Secondary | ICD-10-CM

## 2013-02-19 NOTE — Progress Notes (Signed)
Pt is this am at 07:55am and states she has complied with the prep; she has had nothing to eat or drink since midnight. The pill was swallowed w/o difficulty and computer module applied. Pt lay here for 30 minutes w/o any signs of nausea or vomiting or coughing. She was given instructions for meals during the day and precautions to observe as well as activity; pt stated understanding and left about 0835am.   Pill LOT 2014     [id number]    25    Pt's husband called at 12:58pm to report pt had already passed the pill. He was instructed to bring in the eqpt. Module was attached to the base computer.

## 2013-02-19 NOTE — Telephone Encounter (Signed)
Spouse states pt has passed the capsule. Explained to him he can unhook the eqpt and bring it in.

## 2013-02-23 ENCOUNTER — Telehealth: Payer: Self-pay

## 2013-02-23 NOTE — Telephone Encounter (Signed)
Please have her see an APP sooner in office for DRE and consider anoscopy.

## 2013-02-23 NOTE — Telephone Encounter (Signed)
Patient advised of the results of the capsule endoscopy.  She is scheduled for follow up with Dr. Russella Dar for 03/04/13.  She is advised to resume her po iron.  She reports continued daily rectal bleeding.  Dr. Russella Dar does she need an earlier appt this week or ok to keep 03/04/13 appt with you?

## 2013-02-23 NOTE — Telephone Encounter (Signed)
She should be using Anusol HC supp bid and Anusol HC cream bid for her hemorrhoidal bleeding.

## 2013-02-23 NOTE — Telephone Encounter (Signed)
Patient advised she is unable to come until Thursday.  She is scheduled to see Mike Gip PA 02/25/13 1:30.  She is aware that I have cancelled the upcoming appt on 03/04/13 with Dr. Russella Dar.  She reports she is using the anusol suppositories and creams

## 2013-02-24 ENCOUNTER — Other Ambulatory Visit: Payer: Self-pay

## 2013-02-24 DIAGNOSIS — D649 Anemia, unspecified: Secondary | ICD-10-CM

## 2013-02-25 ENCOUNTER — Encounter: Payer: Self-pay | Admitting: Gastroenterology

## 2013-02-25 ENCOUNTER — Encounter: Payer: Self-pay | Admitting: Physician Assistant

## 2013-02-25 ENCOUNTER — Other Ambulatory Visit (INDEPENDENT_AMBULATORY_CARE_PROVIDER_SITE_OTHER): Payer: Medicare Other

## 2013-02-25 ENCOUNTER — Ambulatory Visit (INDEPENDENT_AMBULATORY_CARE_PROVIDER_SITE_OTHER): Payer: Medicare Other | Admitting: Physician Assistant

## 2013-02-25 VITALS — BP 128/60 | HR 104 | Ht <= 58 in | Wt 130.0 lb

## 2013-02-25 DIAGNOSIS — K625 Hemorrhage of anus and rectum: Secondary | ICD-10-CM | POA: Diagnosis not present

## 2013-02-25 DIAGNOSIS — K648 Other hemorrhoids: Secondary | ICD-10-CM

## 2013-02-25 DIAGNOSIS — K644 Residual hemorrhoidal skin tags: Secondary | ICD-10-CM

## 2013-02-25 DIAGNOSIS — D62 Acute posthemorrhagic anemia: Secondary | ICD-10-CM | POA: Diagnosis not present

## 2013-02-25 DIAGNOSIS — D649 Anemia, unspecified: Secondary | ICD-10-CM | POA: Diagnosis not present

## 2013-02-25 DIAGNOSIS — K5730 Diverticulosis of large intestine without perforation or abscess without bleeding: Secondary | ICD-10-CM

## 2013-02-25 LAB — CBC WITH DIFFERENTIAL/PLATELET
Basophils Absolute: 0 10*3/uL (ref 0.0–0.1)
Basophils Relative: 0 % (ref 0.0–3.0)
Eosinophils Absolute: 0 10*3/uL (ref 0.0–0.7)
Eosinophils Relative: 0.4 % (ref 0.0–5.0)
HCT: 21.6 % — CL (ref 36.0–46.0)
Hemoglobin: 6.9 g/dL — CL (ref 12.0–15.0)
Lymphocytes Relative: 8.8 % — ABNORMAL LOW (ref 12.0–46.0)
Lymphs Abs: 0.9 10*3/uL (ref 0.7–4.0)
MCHC: 32.2 g/dL (ref 30.0–36.0)
MCV: 84.5 fl (ref 78.0–100.0)
Monocytes Absolute: 0.3 10*3/uL (ref 0.1–1.0)
Neutro Abs: 9.3 10*3/uL — ABNORMAL HIGH (ref 1.4–7.7)
RBC: 2.55 Mil/uL — ABNORMAL LOW (ref 3.87–5.11)
RDW: 16.7 % — ABNORMAL HIGH (ref 11.5–14.6)
WBC: 10.6 10*3/uL — ABNORMAL HIGH (ref 4.5–10.5)

## 2013-02-25 MED ORDER — HYDROCORTISONE ACETATE 25 MG RE SUPP: RECTAL | Status: DC

## 2013-02-25 MED ORDER — ACETAMINOPHEN 325 MG PO TABS: 650.0000 mg | ORAL_TABLET | Freq: Once | ORAL | Status: DC

## 2013-02-25 NOTE — Patient Instructions (Addendum)
We sent refills for the suppositories to [facility] [address].  We have scheduled the transusion for tomorrow 02-26-2013 at [facility] department. Arrive at 8:00 am . Come to our lab on Tuesday 03-02-2013 for a repeat CBC.

## 2013-02-25 NOTE — Progress Notes (Signed)
Reviewed and agree with management plan. Very complex patient with severe comorbidities. Her recurrent anemia is out of proportion to her bleeding. Please refer to hematology.  Venita Lick. Russella Dar, MD [facility]

## 2013-02-25 NOTE — Progress Notes (Signed)
Subjective:    Patient ID: Marisa [name], female    DOB: 02-17-1941, 72 y.o.   MRN: [id number]  HPI Marisa [name] is a pleasant 72 year old female known to Dr. Russella Dar. She had undergone colonoscopy in September 2014 due to complaints of hematochezia and anemia. She was found to have mild diverticulosis in the sigmoid colon and moderate internal and external hemorrhoids. She was treated with suppositories and was suggested she may need surgical referral if her bleeding persisted. She had an admission to the hospital on 02/10/2013 with hemoglobin of 5.9 and progressive complaints of weakness and had fallen at home. She related that she had been having ongoing hematochezia for 2 weeks prior to that admission every time she had a bowel movement. She was transfused to a hemoglobin of 9.7. She then underwent upper endoscopy on 02/11/2013 per Dr. 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 Subsequently was set up for capsule endoscopy which was done in our office last week and was unremarkable. Patient called the office stating that she was still seeing some blood in her stools and is brought in today for evaluation. She had labs done prior to this office visit showing hemoglobin back down to 6.9 today Patient does have significant comorbidities with rheumatoid arthritis on chronic prednisone and had been on Arava which has just been discontinued. She has type 2 diabetes and chronic rheumatoid lung disease oxygen dependent. She states that she's been using hydrocortisone suppositories at home over the past week and that has definitely helped her bleeding however when she was discharged from the hospital she was still seeing bright red blood with every bowel movement and seems to feel that there was blood in the commode mixed in with the stool and on the tissue. She said she bled for 2 days after she went home from the hospital and now over the past couple of days since using  the suppositories she's only seen scant pinkish amounts of blood on the tissue. She has no complaints of abdominal pain or cramping has some mild rectal soreness externally. She feels that spicy foods definitely aggravate the bleeding. She does feel somewhat more short of breath than usual denies any chest pain dizziness lightheadedness etc. today     Review of Systems  Constitutional: Positive for fatigue.  HENT: Negative.   Eyes: Negative.   Respiratory: Positive for shortness of breath.   Cardiovascular: Negative.   Gastrointestinal: Positive for blood in stool.  Endocrine: Negative.   Genitourinary: Negative.   Musculoskeletal: Positive for arthralgias.  Skin: Negative.   Allergic/Immunologic: Negative.   Neurological: Negative.   Hematological: Negative.   Psychiatric/Behavioral: Negative.    Outpatient Prescriptions Prior to Visit  Medication Sig Dispense Refill  . albuterol (PROVENTIL) (5 MG/ML) 0.5% nebulizer solution Take 2.5 mg by nebulization every 6 (six) hours as needed for wheezing or shortness of breath.      . Coenzyme Q10 (CO Q 10) 100 MG CAPS Take 100 mg by mouth daily.      Marland Kitchen docusate sodium (COLACE) 100 MG capsule Take 100 mg by mouth daily as needed for constipation.      Marland Kitchen esomeprazole (NEXIUM) 40 MG capsule Take 40 mg by mouth 2 (two) times daily.       Marland Kitchen estrogens, conjugated, (PREMARIN) 0.625 MG tablet Take 0.625 mg by mouth daily.       . folic acid (FOLVITE) 1 MG tablet Take 1 mg by mouth daily.        Marland Kitchen  HYDROcodone-acetaminophen (NORCO/VICODIN) 5-325 MG per tablet Take 2 tablets by mouth every 6 (six) hours as needed for pain.      Marland Kitchen insulin aspart (NOVOLOG FLEXPEN) 100 UNIT/ML injection Inject 3-12 Units into the skin 3 (three) times daily before meals. Sliding scale      . insulin detemir (LEVEMIR FLEXPEN) 100 UNIT/ML injection Inject 22 Units into the skin daily at 12 noon.       . levalbuterol (XOPENEX HFA) 45 MCG/ACT inhaler Inhale 1-2 puffs into the  lungs every 4 (four) hours as needed for wheezing.      Marland Kitchen lisinopril (PRINIVIL,ZESTRIL) 20 MG tablet Take 20 mg by mouth daily.      Marland Kitchen LORazepam (ATIVAN) 0.5 MG tablet Take 0.5 mg by mouth every 8 (eight) hours.      . metoprolol (TOPROL XL) 100 MG 24 hr tablet Take 100 mg by mouth daily.       . Multiple Vitamin (MULTIVITAMINS PO) Take 1 tablet by mouth. Daily.       . phenylephrine-shark liver oil-mineral oil-petrolatum (PREPARATION H) 0.25-3-14-71.9 % rectal ointment Place 1 application rectally 2 (two) times daily as needed for hemorrhoids.      . potassium chloride SA (K-DUR,KLOR-CON) 20 MEQ tablet Take 20 mEq by mouth 2 (two) times daily.       . predniSONE (DELTASONE) 10 MG tablet Take 10 mg by mouth daily.      . pregabalin (LYRICA) 75 MG capsule Take 75-150 mg by mouth 2 (two) times daily. 1 IN THE AM AND 2 IN THE PM      . promethazine (PHENERGAN) 12.5 MG tablet Take 12.5 mg by mouth every 6 (six) hours as needed for nausea.      . vitamin A 8000 UNIT capsule Take 8,000 Units by mouth daily.      . Cholecalciferol (VITAMIN D) 2000 UNITS CAPS Take 2,000 capsules by mouth daily.      Marland Kitchen leflunomide (ARAVA) 20 MG tablet Take 20 mg by mouth daily.       No facility-administered medications prior to visit.   Allergies  Allergen Reactions  . Demerol [Meperidine] Other (See Comments)    Drives me crazy   . Penicillins Other (See Comments)    Throat swelling   Patient Active Problem List   Diagnosis Date Noted  . Chronic respiratory failure 02/11/2013  . Dyspnea 02/11/2013  . 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 Acute blood loss anemia 02/10/2013  . Gastroenteritis 02/10/2013  . Blood in stool 01/05/2013  . Personal history of colonic polyps 01/05/2013  . Anemia 01/04/2013  . Iron deficiency 02/26/2012  . SOB (shortness of breath) 02/26/2012  . Hyponatremia 02/26/2012  . AKI (acute kidney injury) 02/26/2012  . MRSA cellulitis 10/08/2011  . Abscess of buttock, right 10/08/2011  .  Sinus tachycardia 10/08/2011  . Protein calorie malnutrition 10/08/2011  . SIRS (systemic inflammatory response syndrome) 09/29/2011  . Tachycardia 09/29/2011  . Diabetes mellitus type 2, insulin dependent   . Chronic anemia   . NEOPLASM, MALIGNANT, CARCINOMA, BASAL CELL, NOSE 06/08/2007  . POLYP, COLON 06/08/2007  . HYPOKALEMIA 06/08/2007  . ANEMIA, NORMOCYTIC 06/08/2007  . ANXIETY 06/08/2007  . HYPERTENSION 06/08/2007  . UNSPECIFIED ACUTE PERICARDITIS 06/08/2007  . SINUS ARRHYTHMIA 06/08/2007  . HEMORRHOIDS, INTERNAL 06/08/2007  . ALLERGIC RHINITIS 06/08/2007  . BRONCHITIS 06/08/2007  . ASTHMA 06/08/2007  . COPD 06/08/2007  . ESOPHAGEAL STRICTURE 06/08/2007  . GERD 06/08/2007  . GASTRITIS 06/08/2007  . CONSTIPATION, CHRONIC 06/08/2007  .  IRRITABLE BOWEL SYNDROME 06/08/2007  . Rheumatoid arthritis(714.0) 06/08/2007  . SLEEP APNEA 06/08/2007  . HEADACHE, CHRONIC 06/08/2007  . NEOPLASM, MALIGNANT, CARCINOMA, BASAL CELL, NOSE 06/08/2007   History  Substance Use Topics  . Smoking status: Never Smoker   . Smokeless tobacco: Never Used  . Alcohol Use: No   family history is not on file.     Objective:   Physical Exam  well-developed older white female who ambulates with a walker. Accompanied by her husband blood pressure 128/60 pulse 104 height 4 foot 6 weight 130.Marland Kitchen HEENT; nontraumatic normocephalic EOMI PERRLA sclera anicteric conjunctiva are pale, Cardiovascular; regular rate and rhythm with S1-S2 no murmur or gallop, Pulmonary ;clear bilaterally, abdomen; soft nontender nondistended bowel sounds are active there is no palpable mass or hepatosplenomegaly, Rectal; exam she is large external hemorrhoidal tags no thrombosed or significantly inflamed hemorrhoids, on digital exam there is a small amount of stool in the rectal vault is grossly nonbloody but Hemoccult-positive. Extremities ;no clubbing cyanosis or edema she does have significant arthritic deformities, Psych; mood and  affect normal and appropriate        Assessment & Plan:  # 21  72 year old female with rheumatoid arthritis and rheumatoid lung disease- oxygen dependent  With recurrent marked anemia secondary to ongoing GI blood loss over the past week and a half and hemoglobin down 2 g since discharge from the hospital. She has not had any active bleeding over the past 3-4 days but had been seeing bright red blood with her bowel movements prior to that Is unclear whether this bleeding is all secondary to internal hemorrhoids versus a stuttering diverticular bleed. Recent EGD and capsule endoscopy unrevealing  #2 rheumatoid arthritis-on chronic steroids, had been on  Arava which has been stopped #3 adult-onset diabetes mellitus #4 COPD #5 status post MI 2012 #6 chronic GERD #7 history of anemia of chronic disease  Plan; Patient is set up for outpatient blood transfusion tomorrow morning at [facility], to be transfused 2 units of packed rbc's Followup CBC in 5 days Patient and husband were both advised that should she develop recurrent active bleeding she should proceed to the emergency room Continue hydrocortisone suppositories twice daily for 1 more week and at bedtime for 2 weeks Preparation H externally as needed She is a poor surgical candidate and perforation not to be referred to surgery if possible even for hemorrhoidectomy. She will need followup office visit with Dr. Russella Dar

## 2013-02-26 ENCOUNTER — Ambulatory Visit (HOSPITAL_COMMUNITY)
Admission: RE | Admit: 2013-02-26 | Discharge: 2013-02-26 | Disposition: A | Discharge status: Home / Self Care | Payer: Medicare Other | Source: Ambulatory Visit | Attending: Gastroenterology | Admitting: Gastroenterology

## 2013-02-26 VITALS — BP 166/70 | HR 105 | Temp 98.6°F | Resp 20

## 2013-02-26 DIAGNOSIS — K645 Perianal venous thrombosis: Secondary | ICD-10-CM | POA: Diagnosis not present

## 2013-02-26 DIAGNOSIS — D62 Acute posthemorrhagic anemia: Secondary | ICD-10-CM

## 2013-02-26 DIAGNOSIS — K5730 Diverticulosis of large intestine without perforation or abscess without bleeding: Secondary | ICD-10-CM | POA: Insufficient documentation

## 2013-02-26 DIAGNOSIS — K644 Residual hemorrhoidal skin tags: Secondary | ICD-10-CM

## 2013-02-26 DIAGNOSIS — K625 Hemorrhage of anus and rectum: Secondary | ICD-10-CM | POA: Diagnosis not present

## 2013-02-26 DIAGNOSIS — K648 Other hemorrhoids: Secondary | ICD-10-CM

## 2013-02-26 LAB — PREPARE RBC (CROSSMATCH)

## 2013-02-26 MED ORDER — ACETAMINOPHEN 325 MG PO TABS: 650.0000 mg | ORAL_TABLET | Freq: Once | ORAL | Status: DC

## 2013-02-26 MED ORDER — ACETAMINOPHEN 325 MG PO TABS
ORAL_TABLET | ORAL | Status: AC
  Administered 2013-02-26: 650 mg
  Filled 2013-02-26: qty 2

## 2013-02-27 LAB — TYPE AND SCREEN: Unit division: 0

## 2013-03-01 ENCOUNTER — Other Ambulatory Visit: Payer: Self-pay

## 2013-03-01 DIAGNOSIS — D649 Anemia, unspecified: Secondary | ICD-10-CM

## 2013-03-01 NOTE — Progress Notes (Signed)
Patient advised of referral to hematology and that she should expect a call in the next few days.      Patient is scheduled for 03/08/13 with Dr. Darrold Span

## 2013-03-02 ENCOUNTER — Other Ambulatory Visit (INDEPENDENT_AMBULATORY_CARE_PROVIDER_SITE_OTHER): Payer: Medicare Other

## 2013-03-02 DIAGNOSIS — D62 Acute posthemorrhagic anemia: Secondary | ICD-10-CM

## 2013-03-02 LAB — CBC WITH DIFFERENTIAL/PLATELET
Basophils Absolute: 0.1 10*3/uL (ref 0.0–0.1)
Eosinophils Absolute: 0.1 10*3/uL (ref 0.0–0.7)
HCT: 27.4 % — ABNORMAL LOW (ref 36.0–46.0)
Lymphocytes Relative: 5.4 % — ABNORMAL LOW (ref 12.0–46.0)
MCHC: 32.5 g/dL (ref 30.0–36.0)
Monocytes Absolute: 0.9 10*3/uL (ref 0.1–1.0)
Neutrophils Relative %: 85.9 % — ABNORMAL HIGH (ref 43.0–77.0)
RDW: 17.6 % — ABNORMAL HIGH (ref 11.5–14.6)

## 2013-03-03 ENCOUNTER — Telehealth: Payer: Self-pay | Admitting: Oncology

## 2013-03-03 NOTE — Telephone Encounter (Signed)
C/D 03/03/13 for appt. 03/08/13

## 2013-03-03 NOTE — Telephone Encounter (Signed)
S/w pt and gve np appt 11/24 @ 3 w/Dr. Darrold Span Referring Dr. Russella Dar Dx- Anemia Welcome packet mailed.

## 2013-03-04 ENCOUNTER — Ambulatory Visit: Payer: Medicare Other | Admitting: Gastroenterology

## 2013-03-07 ENCOUNTER — Other Ambulatory Visit: Payer: Self-pay | Admitting: Oncology

## 2013-03-07 DIAGNOSIS — D500 Iron deficiency anemia secondary to blood loss (chronic): Secondary | ICD-10-CM

## 2013-03-08 ENCOUNTER — Other Ambulatory Visit: Payer: Self-pay

## 2013-03-08 ENCOUNTER — Encounter: Payer: Self-pay | Admitting: Oncology

## 2013-03-08 ENCOUNTER — Ambulatory Visit: Payer: Medicare Other

## 2013-03-08 ENCOUNTER — Ambulatory Visit (HOSPITAL_COMMUNITY)
Admission: RE | Admit: 2013-03-08 | Discharge: 2013-03-08 | Disposition: A | Discharge status: Home / Self Care | Payer: Medicare Other | Source: Ambulatory Visit | Attending: Oncology | Admitting: Oncology

## 2013-03-08 ENCOUNTER — Ambulatory Visit: Payer: Medicare Other | Admitting: Lab

## 2013-03-08 ENCOUNTER — Other Ambulatory Visit (HOSPITAL_BASED_OUTPATIENT_CLINIC_OR_DEPARTMENT_OTHER): Payer: Medicare Other | Admitting: Lab

## 2013-03-08 ENCOUNTER — Telehealth: Payer: Self-pay | Admitting: *Deleted

## 2013-03-08 ENCOUNTER — Ambulatory Visit (HOSPITAL_BASED_OUTPATIENT_CLINIC_OR_DEPARTMENT_OTHER): Payer: Medicare Other | Admitting: Oncology

## 2013-03-08 VITALS — BP 127/68 | HR 97 | Temp 98.7°F | Resp 18 | Ht <= 58 in | Wt 129.8 lb

## 2013-03-08 DIAGNOSIS — E119 Type 2 diabetes mellitus without complications: Secondary | ICD-10-CM | POA: Diagnosis not present

## 2013-03-08 DIAGNOSIS — D649 Anemia, unspecified: Secondary | ICD-10-CM

## 2013-03-08 DIAGNOSIS — N189 Chronic kidney disease, unspecified: Secondary | ICD-10-CM | POA: Diagnosis not present

## 2013-03-08 DIAGNOSIS — K625 Hemorrhage of anus and rectum: Secondary | ICD-10-CM

## 2013-03-08 DIAGNOSIS — D500 Iron deficiency anemia secondary to blood loss (chronic): Secondary | ICD-10-CM | POA: Diagnosis not present

## 2013-03-08 LAB — IRON AND TIBC CHCC
%SAT: 3 % — ABNORMAL LOW (ref 21–57)
Iron: 10 ug/dL — ABNORMAL LOW (ref 41–142)
TIBC: 320 ug/dL (ref 236–444)

## 2013-03-08 LAB — CBC & DIFF AND RETIC
Basophils Absolute: 0 10*3/uL (ref 0.0–0.1)
EOS%: 0.1 % (ref 0.0–7.0)
Eosinophils Absolute: 0 10*3/uL (ref 0.0–0.5)
HGB: 6.3 g/dL — CL (ref 11.6–15.9)
MCH: 27.5 pg (ref 25.1–34.0)
NEUT#: 14.1 10*3/uL — ABNORMAL HIGH (ref 1.5–6.5)
NEUT%: 95.2 % — ABNORMAL HIGH (ref 38.4–76.8)
RBC: 2.29 10*6/uL — ABNORMAL LOW (ref 3.70–5.45)
RDW: 16.6 % — ABNORMAL HIGH (ref 11.2–14.5)
Retic %: 2.59 % — ABNORMAL HIGH (ref 0.70–2.10)
Retic Ct Abs: 59.31 10*3/uL (ref 33.70–90.70)
lymph#: 0.3 10*3/uL — ABNORMAL LOW (ref 0.9–3.3)
nRBC: 0 % (ref 0–0)

## 2013-03-08 LAB — FERRITIN CHCC: Ferritin: 19 ng/ml (ref 9–269)

## 2013-03-08 LAB — PREPARE RBC (CROSSMATCH)

## 2013-03-08 NOTE — Patient Instructions (Signed)
If weaker or more symptomatic from anemia before transfusion tomorrow, go to ER  OK not to take Integra for now if it causes nausea. We will set up IV iron if that is still low when labs return.

## 2013-03-08 NOTE — Telephone Encounter (Signed)
appts made and printed...td 

## 2013-03-08 NOTE — Progress Notes (Signed)
[facility] NEW PATIENT EVALUATION   Name: Marisa [name] Date: 03/08/2013 MRN: [id number] DOB: 1941-01-24  REFERRING PHYSICIAN: Juline Patch, MD CC: M.Stark/ J.Marina Goodell, J.Beekman, ([place])   REASON FOR REFERRAL: recurrent anemia   HISTORY OF PRESENT ILLNESS:Marisa [name] is a 72 y.o. female who is seen in consultation, together with her husband, at the request of  Dr Juline Patch, with progressively more severe anemia. She has multiple significant comorbidities including rheumatoid arthritis x 20 years,significant recent and ongoing bleeding from  hemorrhoids, atrophic gastritis, chronic steroid use, asthma and oxygen dependent respiratory problems.  She has long history of some anemia, with serum iron <10 and ferritin 12 in Nov 2013, much worse and more symptomatic since ~Sept 2014 when she was hospitalized with hemoglobin of 4.6. She has been transfused 2 units PRBCs in 12-2012, 2 units in 01-2013 and 2 units on 02-26-13; she was hospitalized 9-22 thru 01-07-13 and 10-29 thru 02-12-13. She had colonoscopy by Dr Russella Dar 01-06-13 with hemorrhoids and mild diverticulosis; she had upper endoscopy 02-11-13 with atrophic gastritis, and capsule endoscopy 02-19-13 not otherwise remarkable. She has been on oral Integra x a few years, which she feels causes nausea now. She has never had IV iron that she or husband recall. Dr Dierdre Forth recently DCd Arava (leflunomide), which is reported to have up to 10% incidence of anemia  Associated.She did feel better after PRBC transfusions, and does not feel as weak today as she did with Hgb at 4.6. She is already on continuous home O2 and has limited activity due to RA, but did walk with walker in home today. She has seen no other bleeding. For past months or as long as a year, she has had heavy BRB with bowel movements, states up to "handsful" of blood at a time. This has improved recently with prepH alternating with anusol HC suppository several times  daily.  Previous labs: 02-2012 Hgb 7 with MCV 69; 09-2011 Hgb 9-7 - 8.9. 11-2010 iron 29, %sat 10 with Hgb 8.5; 11-2008 Hgb 10.7 with MCV 91. B12 02-12-13 900 with folate >20, UA negative for blood and CMET with BUN 13, Creat 1.13, GFR 54, TP 5.4 and alb 2.6. TSH WNL 09-2011. Hemoccults + 01-2013 and 05-2012  She does not have central catheter.  She is due for visit to Dr Ricki Miller, whom she sees every few months, and also expects to schedule with Dr Dierdre Forth after the anemia evaluation is completed.  REVIEW OF SYSTEMS as above, also: No fever or recent symptoms of infection. Intermittent HA. Wears glasses. Hearing ok. Needs dental work but no acute problems. No thyroid problems. Minimal cough, NP. Is at good weight, stable. Has not been aware of changes in breasts, no mammograms x several years. Bladder ok. Slight pedal edema unchanged. RA worst hands, shoulders, neck, hips.  Remainder of full 10 point review of systems negative.   ALLERGIES: Demerol and Penicillins  PAST MEDICAL/ SURGICAL HISTORY:    RA since age 41, on daily steroids x years, previously did well with MTX until insurance refused to cover. On arava x 2-3 years until [facility] early Nov 2014 due to anemia.  Type 2 DC on insulin  HTN, nonSTEMI  8 -2012 (per EMR, tho patient and husband not aware of this), SVT, pericarditis/ pericardial effusion  Single seizure around diagnosis of DM  Asthma  Perirectal abscess with I&D by Dr Abbey Chatters 2013, possibly MRSA  Cholecystectomy, abd hysterectomy  Esophageal stricture, dilated at least 2006 and 2008  Colonoscopy and upper endoscopy + capsule endoscopy 02-2013  Last mammograms thru [facility] a few years ago  CURRENT MEDICATIONS: reviewed as listed now in EMR MTX was DCd in past > 1 year due to insurance no longer covering cost.  PHARMACY [facility]   SOCIAL HISTORY: From [place], married x 36 years, husband retired after 43 years with telephone service and also has EMT training;  patient was homemaker. 1 Son, 1 daughter, 3 grandchildren live locally. Minimal cigarettes in college. No Etoh. She requires help with ADLs including dressing and brushing hair.   FAMILY HISTORY:  Mother DM, died CHF Father MI, CVA Children healthy, grandchildren healthy Sister with thyroid cancer, doing well after treatment by Dr Clelia Croft Sister DM No hematologic problems or cancer in family.         PHYSICAL EXAM:  height is 4\' 6"  (1.372 m) and weight is 129 lb 12.8 oz (58.877 kg). Her oral temperature is 98.7 F (37.1 C). Her blood pressure is 127/68 and her pulse is 97. Her respiration is 18.   Chronically ill appearing WF looks stated age, in [facility] with [place] O2. Alert, pleasant, cooperative, fully oriented and appropriate; she and husband excellent historians.   HEENT: PERRL, not icteric. Oral mucosa moist and clear, tongue smooth. No JVD, no supraclavicular adenopathy.  RESPIRATORY: respirations not labored on [place] O2. Few expiratory wheezes right anterior otherwise clear. No use of accessory muscles  CARDIAC/ VASCULAR: RRR no gallop. Clear heart sounds, no rub.  ABDOMEN: soft, some bowel sounds, not distended, not tender epigastrium, no appreciable HSM  LYMPH NODES: No cervical, supraclavicular, axillary adenopathy  NEUROLOGIC: no focal deficits CN, motor, sensory on exam just in WC. Psych as above  SKIN: pale, thin, no ecchymoses or rash. 2 fingernails loose at bases.  MUSCULOSKELETAL: trace pedal edema bilaterally, no cords or tenderness. Joint deformities hands bilaterally. Limited ROM both shoulders and neck.    LABORATORY DATA:  Results for orders placed in visit on 03/08/13 (from the past 48 hour(s))  CBC & DIFF AND RETIC     Status: Abnormal   Collection Time    03/08/13  3:01 PM      Result Value Range   WBC 14.8 (*) 3.9 - 10.3 10e3/uL   NEUT# 14.1 (*) 1.5 - 6.5 10e3/uL   HGB 6.3 (*) 11.6 - 15.9 g/dL   HCT 78.2 (*) 95.6 - 21.3 %   Platelets 322  145 - 400 10e3/uL    MCV 90.8  79.5 - 101.0 fL   MCH 27.5  25.1 - 34.0 pg   MCHC 30.3 (*) 31.5 - 36.0 g/dL   RBC 0.86 (*) 5.78 - 4.69 10e6/uL   RDW 16.6 (*) 11.2 - 14.5 %   lymph# 0.3 (*) 0.9 - 3.3 10e3/uL   MONO# 0.3  0.1 - 0.9 10e3/uL   Eosinophils Absolute 0.0  0.0 - 0.5 10e3/uL   Basophils Absolute 0.0  0.0 - 0.1 10e3/uL   NEUT% 95.2 (*) 38.4 - 76.8 %   LYMPH% 2.3 (*) 14.0 - 49.7 %   MONO% 2.3  0.0 - 14.0 %   EOS% 0.1  0.0 - 7.0 %   BASO% 0.1  0.0 - 2.0 %   nRBC 0  0 - 0 %   Retic % 2.59 (*) 0.70 - 2.10 %   Retic Ct Abs 59.31  33.70 - 90.70 10e3/uL   Immature Retic Fract 18.60 (*) 1.60 - 10.00 %     Iron studies available during visit  with serum iron 10, %sat 3, ferritin 19  Erythropoietin level returned after visit at 104 PATHOLOGY: none   RADIOGRAPHY: Previous imaging reviewed, including CT AP 05-2012, with clear lung bases, old granulomatous disease in spleen, normal liver and portal system, scoliosis, severe degenerative changes spine, bilateral pubic rami and rib fractures    DISCUSSION: We have reviewed all of history as above and pertinent lab information. She appears to have been iron deficient for some time, likely much more anemic now with recent significant bleeding from hemorrhoids. We have discussed difficulty absorbing oral iron, worse in her with atrophic gastritis, and option of IV iron dextran. She agrees to IV iron and additionally agrees to transfusion of 2 units PRBCs which can be [facility] at [facility] on 03-09-13; we will set up IV iron within next week or so as possible with Thanksgiving holiday. Will need to follow CBC closely over next few weeks along with rectal bleeding, to be sure problem is resolved.     IMPRESSION / PLAN:   1.Anemia: iron deficient likely from hemorrhoidal bleeding x months, multiple blood draws x years and inadequate oral absorption of iron, also may have element of anemia of chronic disease and/or anemia related to [place]. Type and cross done now for 2 units  PRBCs at [facility] tomorrow. Will set up IV iron dextran in next 1-2 weeks and I will see her with repeat CBC in 1-2 weeks. 2.rheumatoid arthritis x 20 years, on chronic prednisone 3. Atrophic gastritis, hx adenomatous colon polyps 4.diabetes on insulin, followed by Dr Ricki Miller 5.history of esophageal stricture 6.asthma 7.hx perirectal abscess, resolved with I&D 8.post cholecystectomy and abdominal hysterectomy 9.HTN, hx nonSTEMI 10.hx pericarditis 11.scoliosis and degenerative arthritis in spine 12.hx single seizure possibly related to DM 13 chronic renal insufficiency: erythropoietin level good, however.  Patient and husband have had questions answered to their satisfaction and are in agreement with plan above. They can contact this office for questions or concerns at any time prior to next scheduled visit.  Time spent 55 min, including >50% discussion and coordination of care.    LIVESAY,LENNIS P, MD 03/08/2013 3:57 PM

## 2013-03-09 ENCOUNTER — Telehealth: Payer: Self-pay | Admitting: Oncology

## 2013-03-09 ENCOUNTER — Ambulatory Visit (HOSPITAL_BASED_OUTPATIENT_CLINIC_OR_DEPARTMENT_OTHER): Payer: Medicare Other

## 2013-03-09 VITALS — BP 178/64 | HR 87 | Temp 98.6°F | Resp 16

## 2013-03-09 DIAGNOSIS — D500 Iron deficiency anemia secondary to blood loss (chronic): Secondary | ICD-10-CM

## 2013-03-09 LAB — ERYTHROPOIETIN: Erythropoietin: 104.5 m[IU]/mL — ABNORMAL HIGH (ref 2.6–18.5)

## 2013-03-09 MED ORDER — ACETAMINOPHEN 325 MG PO TABS
ORAL_TABLET | ORAL | Status: AC
  Filled 2013-03-09: qty 2

## 2013-03-09 MED ORDER — ACETAMINOPHEN 325 MG PO TABS
650.0000 mg | ORAL_TABLET | Freq: Once | ORAL | Status: AC
  Administered 2013-03-09: 650 mg via ORAL

## 2013-03-09 MED ORDER — SODIUM CHLORIDE 0.9 % IV SOLN
250.0000 mL | Freq: Once | INTRAVENOUS | Status: AC
  Administered 2013-03-09: 250 mL via INTRAVENOUS

## 2013-03-09 NOTE — Telephone Encounter (Signed)
Added inf for 12/4. lmonvm for pt re new appt for 12/4 and also confirmed appts for 11/25 and 12/3. Pt to get new schedule when she comes in today.

## 2013-03-09 NOTE — Patient Instructions (Signed)
Blood Transfusion  A blood transfusion replaces your blood or some of its parts. Blood is replaced when you have lost blood because of surgery, an accident, or for severe blood conditions like anemia. You can donate blood to be used on yourself if you have a planned surgery. If you lose blood during that surgery, your own blood can be given back to you. Any blood given to you is checked to make sure it matches your blood type. Your temperature, blood pressure, and heart rate (vital signs) will be checked often.  GET HELP RIGHT AWAY IF:   You feel sick to your stomach (nauseous) or throw up (vomit).  You have watery poop (diarrhea).  You have shortness of breath or trouble breathing.  You have blood in your pee (urine) or have dark colored pee.  You have chest pain or tightness.  Your eyes or skin turn yellow (jaundice).  You have a temperature by mouth above 102 F (38.9 C), not controlled by medicine.  You start to shake and have chills.  You develop a a red rash (hives) or feel itchy.  You develop lightheadedness or feel confused.  You develop back, joint, or muscle pain.  You do not feel hungry (lost appetite).  You feel tired, restless, or nervous.  You develop belly (abdominal) cramps. Document Released: 06/28/2008 Document Revised: 06/24/2011 Document Reviewed: 06/28/2008 Exit[employer] Patient Information 2014 [employer].  

## 2013-03-10 LAB — TYPE AND SCREEN
ABO/RH(D): O POS
Antibody Screen: NEGATIVE
Unit division: 0

## 2013-03-14 ENCOUNTER — Other Ambulatory Visit: Payer: Self-pay | Admitting: Oncology

## 2013-03-17 ENCOUNTER — Other Ambulatory Visit: Payer: Self-pay

## 2013-03-17 ENCOUNTER — Encounter: Payer: Self-pay | Admitting: Oncology

## 2013-03-17 ENCOUNTER — Ambulatory Visit (HOSPITAL_BASED_OUTPATIENT_CLINIC_OR_DEPARTMENT_OTHER): Payer: Medicare Other | Admitting: Oncology

## 2013-03-17 ENCOUNTER — Other Ambulatory Visit (HOSPITAL_BASED_OUTPATIENT_CLINIC_OR_DEPARTMENT_OTHER): Payer: Medicare Other | Admitting: Lab

## 2013-03-17 VITALS — BP 134/59 | HR 72 | Temp 98.7°F | Resp 20 | Ht <= 58 in | Wt 133.3 lb

## 2013-03-17 DIAGNOSIS — D500 Iron deficiency anemia secondary to blood loss (chronic): Secondary | ICD-10-CM

## 2013-03-17 DIAGNOSIS — K625 Hemorrhage of anus and rectum: Secondary | ICD-10-CM

## 2013-03-17 DIAGNOSIS — K648 Other hemorrhoids: Secondary | ICD-10-CM

## 2013-03-17 DIAGNOSIS — N189 Chronic kidney disease, unspecified: Secondary | ICD-10-CM | POA: Diagnosis not present

## 2013-03-17 DIAGNOSIS — K649 Unspecified hemorrhoids: Secondary | ICD-10-CM | POA: Diagnosis not present

## 2013-03-17 DIAGNOSIS — E119 Type 2 diabetes mellitus without complications: Secondary | ICD-10-CM | POA: Diagnosis not present

## 2013-03-17 LAB — CBC WITH DIFFERENTIAL/PLATELET
BASO%: 0.7 % (ref 0.0–2.0)
Basophils Absolute: 0.1 10*3/uL (ref 0.0–0.1)
EOS%: 0.3 % (ref 0.0–7.0)
Eosinophils Absolute: 0 10*3/uL (ref 0.0–0.5)
HCT: 27.6 % — ABNORMAL LOW (ref 34.8–46.6)
HGB: 8.5 g/dL — ABNORMAL LOW (ref 11.6–15.9)
LYMPH%: 2.5 % — ABNORMAL LOW (ref 14.0–49.7)
MCH: 27.6 pg (ref 25.1–34.0)
MCHC: 30.9 g/dL — ABNORMAL LOW (ref 31.5–36.0)
MCV: 89.3 fL (ref 79.5–101.0)
MONO#: 0.3 10*3/uL (ref 0.1–0.9)
NEUT#: 12.8 10*3/uL — ABNORMAL HIGH (ref 1.5–6.5)
NEUT%: 94.3 % — ABNORMAL HIGH (ref 38.4–76.8)
Platelets: 317 10*3/uL (ref 145–400)
lymph#: 0.3 10*3/uL — ABNORMAL LOW (ref 0.9–3.3)

## 2013-03-17 MED ORDER — HYDROCORTISONE ACETATE 25 MG RE SUPP: RECTAL | Status: DC

## 2013-03-17 NOTE — Progress Notes (Signed)
OFFICE PROGRESS NOTE   03/17/2013   Physicians:Pang, Gerlene Burdock,  M.Stark/ J.Burnis Kingfisher, ([place])  INTERVAL HISTORY:  Patient is seen, together with husband, in continuing attention to severe anemia, having had 2 units PRBCs transfused on 03-09-13 for Hgb of 6.3 when I met her on 03-08-13. The anemia likely is multifactorial,  but in large part iron deficiency from bleeding hemorrhoids and multiple blood draws related to comorbid problems. Altho she has been on some oral iron in past year (after serum iron <10 and ferritin 12 in Nov 2013), she was recently found to have atrophic gastritis on upper endoscopy and likely is not able to absorb iron po. She is set up for feraheme at this office on 03-18-13. We have discussed good oral hydration and warming arms to help IV access for that procedure.  Patient had last heavy bleeding from rectum after she returned home from my consultation visit on 03-08-13. Bleeding since then has been progressively less, and for first time today her bowel movement had no gross blood associated. She has been using Anusol HC suppositories daily since 02-25-13 and I have renewed these # 14  today given ongoing improvement; I have told her to continue these suppositories daily for next week then prn. She will need direction from GI if the hemorrhoids worsen again. She is not aware of any other bleeding. She did feel at least a little stronger after the 2 units PRBCs on 03-09-13.  She has multiple significant comorbidities including rheumatoid arthritis x 20 years,significant recent and ongoing bleeding from hemorrhoids, atrophic gastritis, chronic steroid use, asthma and oxygen dependent respiratory problems. Patient complains of increased, localized pain at lower C spine today, without history of trauma, this in area that she often has chronic pain and where she uses lidoderm patch. She also feels that breathing is tight today, thinks this is weather related, has not used  home nebulizer yet today but will do this on return home.   HEME/ONC HISTORY She has long history of some anemia, with serum iron <10 and ferritin 12 in Nov 2013, much worse and more symptomatic since ~Sept 2014 when she was hospitalized with hemoglobin of 4.6. For past months or as long as a year, she has had heavy BRB with bowel movements, states up to "handsful" of blood at a time. She has been transfused 2 units PRBCs in 12-2012, 2 units in 01-2013 and 2 units on 02-26-13; she was hospitalized 9-22 thru 01-07-13 and 10-29 thru 02-12-13. She had colonoscopy by Dr Russella Dar 01-06-13 with hemorrhoids and mild diverticulosis; she had upper endoscopy 02-11-13 with atrophic gastritis, and capsule endoscopy 02-19-13 not otherwise remarkable. She has been on oral Integra x a few years, which she feels causes nausea now. She has never had IV iron that she or husband recall. Dr Dierdre Forth recently DCd Arava (leflunomide), which is reported to have up to 10% incidence of anemia Associated.She did feel better after PRBC transfusions, was extremely weak with Hgb at 4.6. She is on continuous home O2 and has limited activity due to RA. She has seen no other bleeding. The rectal bleeding has improved recently with prepH alternating with anusol HC suppository several times daily.     Review of systems as above, also: No fever or symptoms of infection. No cough or actual chest pain. No vomiting. No increased swelling LE. Remainder of 10 point Review of Systems negative.  Objective:  Vital signs in last 24 hours:  BP 134/59  Pulse 72  Temp(Src) 98.7  F (37.1 C) (Oral)  Resp 20  Ht 4\' 6"  (1.372 m)  Wt 133 lb 4.8 oz (60.464 kg)  BMI 32.12 kg/m2  SpO2 100%  Elderly, chronically ill appearing lady in [facility] with [place] O2, looks moderately uncomfortable leaning to side because of neck discomfort, but NAD and just as delightful as at our first meeting. Husband very supportive. Alert, oriented and appropriate.   HEENT:PERRL,  sclerae not icteric. Oral mucosa moist without lesions, posterior pharynx clear. Mucous membranes not quite as pale, tongue smooth. No JVD.  Lymphatics:no cervical,suraclavicular adenopathy Resp: clear to auscultation bilaterally without wheezes or rales, no use of accessory muscles.  Cardio: regular rate and rhythm. No gallop. GI: soft, nontender, not distended, no mass or organomegaly. Normally active bowel sounds. Surgical incision not remarkable. Musculoskeletal/ Extremities: without pitting edema, cords, tenderness. Tender to palpation at spinous process of lower cervical vertebra. Neuro: no new deficits on exam just in [facility], moves all extremities equally. Skin without rash or petechiae, including posterior neck where also no ecchymosis Site of IV access for recent PRBCs not remarkable.   Lab Results:  Results for orders placed in visit on 03/17/13  CBC WITH DIFFERENTIAL      Result Value Range   WBC 13.5 (*) 3.9 - 10.3 10e3/uL   NEUT# 12.8 (*) 1.5 - 6.5 10e3/uL   HGB 8.5 (*) 11.6 - 15.9 g/dL   HCT 16.1 (*) 09.6 - 04.5 %   Platelets 317  145 - 400 10e3/uL   MCV 89.3  79.5 - 101.0 fL   MCH 27.6  25.1 - 34.0 pg   MCHC 30.9 (*) 31.5 - 36.0 g/dL   RBC 4.09 (*) 8.11 - 9.14 10e6/uL   RDW 15.5 (*) 11.2 - 14.5 %   lymph# 0.3 (*) 0.9 - 3.3 10e3/uL   MONO# 0.3  0.1 - 0.9 10e3/uL   Eosinophils Absolute 0.0  0.0 - 0.5 10e3/uL   Basophils Absolute 0.1  0.0 - 0.1 10e3/uL   NEUT% 94.3 (*) 38.4 - 76.8 %   LYMPH% 2.5 (*) 14.0 - 49.7 %   MONO% 2.2  0.0 - 14.0 %   EOS% 0.3  0.0 - 7.0 %   BASO% 0.7  0.0 - 2.0 %    Available after visit PT 11.5 with INR 0.8 and PTT 24  Studies/Results:  No results found.  Medications: I have reviewed the patient's current medications. She has stopped oral iron due to nausea and inadequate absorption. See Anusol HC suppository information above. Fereheme dose to be 1020 mg single administration.  DISCUSSION: Patient is in agreement with IV feraheme as  planned. I will see her back to follow up response in ~ 3-4 weeks.  Assessment/Plan:  1.Anemia: iron deficient likely from hemorrhoidal bleeding x months, multiple blood draws x years and inadequate oral absorption of iron, also may have element of anemia of chronic disease and/or anemia related to [place]. Most recently transfused 2 units PRBCs at [facility] 03-09-13, without complications. For IV iron dextran 03-18-13.  PT/PTT ok today. 2.rheumatoid arthritis x 20 years, on chronic prednisone. Some increased pain in neck today, which hopefully will improve with better position when she returns home today. 3. Atrophic gastritis, hx adenomatous colon polyps, bleeding hemorrhoids: known to [facility]. Hemorrhoids clinically improving with present interventions. 4.diabetes on insulin, followed by Dr Ricki Miller  5.history of esophageal stricture  6.asthma: on continuous O2 and home nebulizers, which she will use on return home. PE no acute respiratory findings tho symptomatic complaints  as above. 7.hx perirectal abscess, resolved with I&D  8.post cholecystectomy and abdominal hysterectomy  9.HTN, hx nonSTEMI  10.hx pericarditis  11.scoliosis and degenerative arthritis in spine: neck pain as above  12.hx single seizure possibly related to DM  13 chronic renal insufficiency: however erythropoietin level ok   Patient and husband in agreement with plan. TIme spent 25 min including >50% discussion and coordination of care.   LIVESAY,LENNIS P, MD   03/17/2013, 3:57 PM

## 2013-03-18 ENCOUNTER — Telehealth: Payer: Self-pay

## 2013-03-18 ENCOUNTER — Ambulatory Visit (HOSPITAL_BASED_OUTPATIENT_CLINIC_OR_DEPARTMENT_OTHER): Payer: Medicare Other

## 2013-03-18 VITALS — BP 172/89 | HR 81 | Temp 98.8°F | Resp 20

## 2013-03-18 DIAGNOSIS — D500 Iron deficiency anemia secondary to blood loss (chronic): Secondary | ICD-10-CM | POA: Diagnosis not present

## 2013-03-18 LAB — PROTHROMBIN TIME: INR: 0.83 (ref <1.50)

## 2013-03-18 MED ORDER — SODIUM CHLORIDE 0.9 % IV SOLN
INTRAVENOUS | Status: DC
  Administered 2013-03-18: 15:00:00 via INTRAVENOUS

## 2013-03-18 MED ORDER — SODIUM CHLORIDE 0.9 % IV SOLN
1020.0000 mg | Freq: Once | INTRAVENOUS | Status: AC
  Administered 2013-03-18: 1020 mg via INTRAVENOUS
  Filled 2013-03-18: qty 34

## 2013-03-18 NOTE — Telephone Encounter (Signed)
Message copied by Lorine Bears on Thu Mar 18, 2013 10:46 AM ------      Message from: Reece Packer      Created: Thu Mar 18, 2013  6:58 AM       Labs seen and need follow up: please let her know that blood clotting tests are ok, so that is not causing increase in bleeding. (she will be in infusion 12-4)      Cc LA, TH ------

## 2013-03-18 NOTE — Patient Instructions (Signed)
Ferumoxytol injection What is this medicine? FERUMOXYTOL is an iron complex. Iron is used to make healthy red blood cells, which carry oxygen and nutrients throughout the body. This medicine is used to treat iron deficiency anemia in people with chronic kidney disease. This medicine may be used for other purposes; ask your health care provider or pharmacist if you have questions. COMMON BRAND NAME(S): Feraheme  What should I tell my health care provider before I take this medicine? They need to know if you have any of these conditions: -anemia not caused by low iron levels -high levels of iron in the blood -magnetic resonance imaging (MRI) test scheduled -an unusual or allergic reaction to iron, other medicines, foods, dyes, or preservatives -pregnant or trying to get pregnant -breast-feeding How should I use this medicine? This medicine is for injection into a vein. It is given by a health care professional in a hospital or clinic setting. Talk to your pediatrician regarding the use of this medicine in children. Special care may be needed. Overdosage: If you think you've taken too much of this medicine contact a poison control center or emergency room at once. Overdosage: If you think you have taken too much of this medicine contact a poison control center or emergency room at once. NOTE: This medicine is only for you. Do not share this medicine with others. What if I miss a dose? It is important not to miss your dose. Call your doctor or health care professional if you are unable to keep an appointment. What may interact with this medicine? This medicine may interact with the following medications: -other iron products This list may not describe all possible interactions. Give your health care provider a list of all the medicines, herbs, non-prescription drugs, or dietary supplements you use. Also tell them if you smoke, drink alcohol, or use illegal drugs. Some items may interact with your  medicine. What should I watch for while using this medicine? Visit your doctor or healthcare professional regularly. Tell your doctor or healthcare professional if your symptoms do not start to get better or if they get worse. You may need blood work done while you are taking this medicine. You may need to follow a special diet. Talk to your doctor. Foods that contain iron include: whole grains/cereals, dried fruits, beans, or peas, leafy green vegetables, and organ meats (liver, kidney). What side effects may I notice from receiving this medicine? Side effects that you should report to your doctor or health care professional as soon as possible: -allergic reactions like skin rash, itching or hives, swelling of the face, lips, or tongue -breathing problems -changes in blood pressure -feeling faint or lightheaded, falls -fever or chills -flushing, sweating, or hot feelings -swelling of the ankles or feet Side effects that usually do not require medical attention (Report these to your doctor or health care professional if they continue or are bothersome.): -diarrhea -headache -nausea, vomiting -stomach pain This list may not describe all possible side effects. Call your doctor for medical advice about side effects. You may report side effects to FDA at 1-800-FDA-1088. Where should I keep my medicine? This drug is given in a hospital or clinic and will not be stored at home. NOTE: This sheet is a summary. It may not cover all possible information. If you have questions about this medicine, talk to your doctor, pharmacist, or health care provider.  2014, Elsevier/Gold Standard. (2011-11-15 15:23:36)  Iron-Rich Diet An iron-rich diet contains foods that are good sources of   iron. Iron is an important mineral that helps your body produce hemoglobin. Hemoglobin is a protein in red blood cells that carries oxygen to the body's tissues. Sometimes, the iron level in your blood can be low. This may be  caused by:  A lack of iron in your diet.  Blood loss.  Times of growth, such as during pregnancy or during a child's growth and development. Low levels of iron can cause a decrease in the number of red blood cells. This can result in iron deficiency anemia. Iron deficiency anemia symptoms include:  Tiredness.  Weakness.  Irritability.  Increased chance of infection. Here are some recommendations for daily iron intake:  Males older than 72 years of age need 8 mg of iron per day.  Women ages 19 to 50 need 18 mg of iron per day.  Pregnant women need 27 mg of iron per day, and women who are over 19 years of age and breastfeeding need 9 mg of iron per day.  Women over the age of 50 need 8 mg of iron per day. SOURCES OF IRON There are 2 types of iron that are found in food: heme iron and nonheme iron. Heme iron is absorbed by the body better than nonheme iron. Heme iron is found in meat, poultry, and fish. Nonheme iron is found in grains, beans, and vegetables. Heme Iron Sources Food / Iron (mg)  Chicken liver, 3 oz (85 g)/ 10 mg  Beef liver, 3 oz (85 g)/ 5.5 mg  Oysters, 3 oz (85 g)/ 8 mg  Beef, 3 oz (85 g)/ 2 to 3 mg  Shrimp, 3 oz (85 g)/ 2.8 mg  Turkey, 3 oz (85 g)/ 2 mg  Chicken, 3 oz (85 g) / 1 mg  Fish (tuna, halibut), 3 oz (85 g)/ 1 mg  Pork, 3 oz (85 g)/ 0.9 mg Nonheme Iron Sources Food / Iron (mg)  Ready-to-eat breakfast cereal, iron-fortified / 3.9 to 7 mg  Tofu,  cup / 3.4 mg  Kidney beans,  cup / 2.6 mg  Baked potato with skin / 2.7 mg  Asparagus,  cup / 2.2 mg  Avocado / 2 mg  Dried peaches,  cup / 1.6 mg  Raisins,  cup / 1.5 mg  Soy milk, 1 cup / 1.5 mg  Whole-wheat bread, 1 slice / 1.2 mg  Spinach, 1 cup / 0.8 mg  Broccoli,  cup / 0.6 mg IRON ABSORPTION Certain foods can decrease the body's absorption of iron. Try to avoid these foods and beverages while eating meals with iron-containing  foods:  Coffee.  Tea.  Fiber.  Soy. Foods containing vitamin C can help increase the amount of iron your body absorbs from iron sources, especially from nonheme sources. Eat foods with vitamin C along with iron-containing foods to increase your iron absorption. Foods that are high in vitamin C include many fruits and vegetables. Some good sources are:  Fresh orange juice.  Oranges.  Strawberries.  Mangoes.  Grapefruit.  Red bell peppers.  Green bell peppers.  Broccoli.  Potatoes with skin.  Tomato juice. Document Released: 11/13/2004 Document Revised: 06/24/2011 Document Reviewed: 09/20/2010 ExitCare Patient Information 2014 [employer].  

## 2013-03-18 NOTE — Telephone Encounter (Signed)
Told Marisa [name] the results of the blood clotting tests as noted below by Dr. Darrold Span. Pt. Verbalized understanding.

## 2013-03-19 ENCOUNTER — Telehealth: Payer: Self-pay | Admitting: Oncology

## 2013-03-19 ENCOUNTER — Other Ambulatory Visit: Payer: Self-pay | Admitting: Oncology

## 2013-03-19 DIAGNOSIS — D500 Iron deficiency anemia secondary to blood loss (chronic): Secondary | ICD-10-CM

## 2013-03-19 NOTE — Telephone Encounter (Signed)
, °

## 2013-04-04 ENCOUNTER — Other Ambulatory Visit: Payer: Self-pay | Admitting: Oncology

## 2013-04-04 DIAGNOSIS — E611 Iron deficiency: Secondary | ICD-10-CM

## 2013-04-05 ENCOUNTER — Encounter: Payer: Self-pay | Admitting: Oncology

## 2013-04-05 ENCOUNTER — Ambulatory Visit (HOSPITAL_BASED_OUTPATIENT_CLINIC_OR_DEPARTMENT_OTHER): Payer: Medicare Other | Admitting: Oncology

## 2013-04-05 ENCOUNTER — Other Ambulatory Visit (HOSPITAL_BASED_OUTPATIENT_CLINIC_OR_DEPARTMENT_OTHER): Payer: Medicare Other

## 2013-04-05 ENCOUNTER — Telehealth: Payer: Self-pay | Admitting: Oncology

## 2013-04-05 VITALS — BP 174/88 | HR 111 | Temp 98.0°F | Resp 20 | Ht <= 58 in | Wt 131.8 lb

## 2013-04-05 DIAGNOSIS — N189 Chronic kidney disease, unspecified: Secondary | ICD-10-CM

## 2013-04-05 DIAGNOSIS — E611 Iron deficiency: Secondary | ICD-10-CM

## 2013-04-05 DIAGNOSIS — E119 Type 2 diabetes mellitus without complications: Secondary | ICD-10-CM

## 2013-04-05 DIAGNOSIS — D509 Iron deficiency anemia, unspecified: Secondary | ICD-10-CM

## 2013-04-05 LAB — CBC & DIFF AND RETIC
EOS%: 0.1 % (ref 0.0–7.0)
Eosinophils Absolute: 0 10*3/uL (ref 0.0–0.5)
HGB: 9.8 g/dL — ABNORMAL LOW (ref 11.6–15.9)
Immature Retic Fract: 7.5 % (ref 1.60–10.00)
LYMPH%: 3.9 % — ABNORMAL LOW (ref 14.0–49.7)
MONO#: 0.6 10*3/uL (ref 0.1–0.9)
NEUT#: 13.9 10*3/uL — ABNORMAL HIGH (ref 1.5–6.5)
NEUT%: 91.9 % — ABNORMAL HIGH (ref 38.4–76.8)
Platelets: 399 10*3/uL (ref 145–400)
RBC: 3.33 10*6/uL — ABNORMAL LOW (ref 3.70–5.45)
RDW: 18.5 % — ABNORMAL HIGH (ref 11.2–14.5)
Retic %: 1.61 % (ref 0.70–2.10)
Retic Ct Abs: 53.61 10*3/uL (ref 33.70–90.70)
WBC: 15.1 10*3/uL — ABNORMAL HIGH (ref 3.9–10.3)
lymph#: 0.6 10*3/uL — ABNORMAL LOW (ref 0.9–3.3)
nRBC: 0 % (ref 0–0)

## 2013-04-05 NOTE — Patient Instructions (Signed)
Call if increased bleeding or significant fatigue before next scheduled appointment.  Ask Dr Russella Dar if present nexium dose still correct  Use sitz baths after BMs

## 2013-04-05 NOTE — Progress Notes (Signed)
OFFICE PROGRESS NOTE   04/05/2013   Physicians: Juline Patch, M.Stark/ J.Marina Goodell, J.Beekman, ([place])   INTERVAL HISTORY:  Patient is seen, together with husband, in continuing attention to anemia, which is at least in part related to iron deficiency and recent lower GI bleeding, and may additionally be from chronic disease and/ or renal insufficiency. She was last transfused 2 units PRBCs on 03-09-13 for Hgb 6.3, and had IV feraheme 1020 mg on 03-18-13 without difficulty. She has seemed a little stronger to her husband just this week, tells me she is now able to hang up clothes and do a little in the kitchen, whereas previously she could barely manage showering and dressing with assistance. Rectal bleeding from hemorrhoids is much better tho still some small BRB occasionally; stools were formed without gross blood in past 24 hours. She completed second course of Anusol HC suppositories and is now using OTC preparation H suppositories. She has not seen other bleeding. PT and PTT were not prolonged on 03-17-13. She does not have appointment scheduled back to Dr Russella Dar yet, needs to contact that office as she also has questions about whether or not to continue bid nexium now.    Hematology history Iron deficiency anemia likely from hemorrhoidal bleeding x months which was heavy at times, multiple blood draws x years and inadequate oral absorption of iron (atrophic gastritis on recent upper endoscopy); also may have element of anemia of chronic disease related to rheumatoid arthritis x 20 years and O2 dependent pulmonary disease, and/or anemia related to [place]. Most recently transfused 2 units PRBCs at [facility] 03-09-13, without complications and given IV iron dextran 03-18-13. PT/PTT ok 03-2013.   Review of systems as above, also: Pain at right TMJ past several days, also possibly some dental discomfort right lower molar and overdue for dental exam. Other RA related pain ~ at baseline, including the  discomfort at cervical spine. Some swelling lower legs and feet bilaterally which is intermittent; she does try to prop up legs when possible. No increased SOB. Voiding ok, appetite ok, continues daily steroids. Remainder of 10 point Review of Systems negative.  Objective:  Vital signs in last 24 hours:  BP 174/88  Pulse 111  Temp(Src) 98 F (36.7 C) (Oral)  Resp 20  Ht 4\' 6"  (1.372 m)  Wt 131 lb 12.8 oz (59.784 kg)  BMI 31.76 kg/m2  Alert, oriented and appropriate, very pleasant as always, looks more comfortable and brighter today, in wheelchair with [place] O2. Husband very supportive. No alopecia  HEENT:PERRL, sclerae not icteric. Oral mucosa moist without lesions, posterior pharynx clear. Some missing teeth, no gum inflammation right lower. TMJ area not swollen or tender to palpation. No oral thrush. Neck without JVD or obvious thyroid mass.  Lymphatics:no cervical,suraclavicular adenopathy Resp: diminished breath sounds otherwise clear to auscultation bilaterally Cardio: regular rate and rhythm. No gallop. GI: soft, nontender, not distended, no appreciable mass or organomegaly with limitations of exam in WC. Normally active bowel sounds.  Musculoskeletal/ Extremities: trace- 1+ edema lower legs and feet, without cords or tenderness. Joint changes hands/ UE consistent with RA Neuro: no focal new deficits on exam in WC. Psych as above. Skin thin consistent with long steroids, scattered small ecchymoses on extremities, without rash or petechiae. No erythema LE   Lab Results:  Results for orders placed in visit on 04/05/13  CBC & DIFF AND RETIC      Result Value Range   WBC 15.1 (*) 3.9 - 10.3 10e3/uL   NEUT#  13.9 (*) 1.5 - 6.5 10e3/uL   HGB 9.8 (*) 11.6 - 15.9 g/dL   HCT 95.6 (*) 21.3 - 08.6 %   Platelets 399  145 - 400 10e3/uL   MCV 95.5  79.5 - 101.0 fL   MCH 29.4  25.1 - 34.0 pg   MCHC 30.8 (*) 31.5 - 36.0 g/dL   RBC 5.78 (*) 4.69 - 6.29 10e6/uL   RDW 18.5 (*) 11.2 - 14.5  %   lymph# 0.6 (*) 0.9 - 3.3 10e3/uL   MONO# 0.6  0.1 - 0.9 10e3/uL   Eosinophils Absolute 0.0  0.0 - 0.5 10e3/uL   Basophils Absolute 0.0  0.0 - 0.1 10e3/uL   NEUT% 91.9 (*) 38.4 - 76.8 %   LYMPH% 3.9 (*) 14.0 - 49.7 %   MONO% 4.0  0.0 - 14.0 %   EOS% 0.1  0.0 - 7.0 %   BASO% 0.1  0.0 - 2.0 %   nRBC 0  0 - 0 %   Retic % 1.61  0.70 - 2.10 %   Retic Ct Abs 53.61  33.70 - 90.70 10e3/uL   Immature Retic Fract 7.50  1.60 - 10.00 %    Hemoglobin is up from 8.5 on 03-17-13 (= day of feraheme). WBC remains slightly elevated likely from prednisone, no apparent infection now.  Studies/Results:  No results found.  Medications: I have reviewed the patient's current medications. She will stop oral iron as she is not absorbing this and has had full dose IV iron as above. She will contact Dr Ardell Isaacs office re bid nexium dose.  DISCUSSION: I have encouraged her to continue sitz baths after every bowel movement to try to keep hemorrhoids settled. We have discussed fact that her anemia may be multifactorial, so will follow up in case additional interventions appropriate after maximum benefit from recent IV iron. She knows that she should contact physician if rectal bleeding worsens.  Assessment/Plan: 1.Anemia: iron deficient likely from hemorrhoidal bleeding x months, multiple blood draws x years and inadequate oral absorption of iron, also may have element of anemia of chronic disease and/or anemia related to [place]. Most recently transfused 2 units PRBCs at [facility] 03-09-13, without complications and had full dose IV iron dextran 03-18-13. PT/PTT ok.  Hgb a little better today and clinically also slightly improved. I will see her in 3 months with counts, or sooner if needed. 2.rheumatoid arthritis x 20 years, on long term daily prednisone. 3. Atrophic gastritis, hx adenomatous colon polyps, bleeding hemorrhoids: known to [facility]. Hemorrhoids clinically improving. 4.diabetes on insulin and metformin,  followed by Dr Ricki Miller  5.history of esophageal stricture  6.asthma: on continuous O2 and home nebulizers 7.hx perirectal abscess, resolved with I&D  8.post cholecystectomy and abdominal hysterectomy  9.HTN, hx nonSTEMI  10.hx pericarditis  11.scoliosis and degenerative arthritis in spine + RA. Likely TMJ symptoms also 12.hx single seizure possibly related to DM  13 chronic renal insufficiency: however erythropoietin level ok 14. Flu vaccine done.  Patient and husband are in agreement with plan and have had questions answered to their satisfaction.   Sair Faulcon P, MD   04/05/2013, 2:42 PM

## 2013-05-12 DIAGNOSIS — J45909 Unspecified asthma, uncomplicated: Secondary | ICD-10-CM | POA: Diagnosis not present

## 2013-05-12 DIAGNOSIS — M069 Rheumatoid arthritis, unspecified: Secondary | ICD-10-CM | POA: Diagnosis not present

## 2013-05-12 DIAGNOSIS — E119 Type 2 diabetes mellitus without complications: Secondary | ICD-10-CM | POA: Diagnosis not present

## 2013-05-12 DIAGNOSIS — D509 Iron deficiency anemia, unspecified: Secondary | ICD-10-CM | POA: Diagnosis not present

## 2013-05-20 ENCOUNTER — Other Ambulatory Visit: Payer: Self-pay | Admitting: Oncology

## 2013-05-20 NOTE — Progress Notes (Signed)
Medial Oncology  Labs received from [facility] dated 05-12-2013: WBC 16.4, Hgb 10.3, plt 297k, MCV 99.1RDW 16.2, segs 91, lymphs 3.3, mono 4.6. Chemistries with creat 1.1, T prot 6.0, alb 3.0, LFTs normal, ferritin 248, serum iron 54, %sat 27.6 Information to be scanned into EMR.  Godfrey Pick, MD

## 2013-05-24 DIAGNOSIS — M545 Low back pain, unspecified: Secondary | ICD-10-CM | POA: Diagnosis not present

## 2013-05-24 DIAGNOSIS — M069 Rheumatoid arthritis, unspecified: Secondary | ICD-10-CM | POA: Diagnosis not present

## 2013-06-02 ENCOUNTER — Ambulatory Visit (INDEPENDENT_AMBULATORY_CARE_PROVIDER_SITE_OTHER): Payer: Medicare Other | Admitting: Gastroenterology

## 2013-06-02 ENCOUNTER — Encounter: Payer: Self-pay | Admitting: Gastroenterology

## 2013-06-02 VITALS — BP 120/80 | HR 72 | Ht <= 58 in | Wt 130.4 lb

## 2013-06-02 DIAGNOSIS — D509 Iron deficiency anemia, unspecified: Secondary | ICD-10-CM

## 2013-06-02 DIAGNOSIS — K648 Other hemorrhoids: Secondary | ICD-10-CM

## 2013-06-02 DIAGNOSIS — K644 Residual hemorrhoidal skin tags: Secondary | ICD-10-CM

## 2013-06-02 MED ORDER — HYDROCORTISONE ACETATE 25 MG RE SUPP: RECTAL | Status: DC

## 2013-06-02 MED ORDER — HYDROCORTISONE 2.5 % RE CREA: 1.0000 "application " | TOPICAL_CREAM | Freq: Every day | RECTAL | Status: DC

## 2013-06-02 NOTE — Progress Notes (Signed)
    History of Present Illness: This is a 73 year old female with multiple comorbidities and a multifactorial anemia, iron deficiency anemia and chronic disease. Her husband is with her today. She has frequent small volume hematochezia, occurring at least every several days. She states that these symptoms have improved with hemorrhoidal medications but persist. She has mild constipation and recently began seeing Colace. She underwent extensive GI evaluation over the past several months including colonoscopy in September showing moderate-sized internal/external hemorrhoids and diverticulosis, upper endoscopy in October showing atrophic mucosa and capsule endoscopy in November that was normal. She has been followed by Dr. Marko Plume and receiving iron infusions  Current Medications, Allergies, Past Medical History, Past Surgical History, Family History and Social History were reviewed in [facility] record.  Physical Exam: General: Well developed , well nourished, nasal O2, no acute distress Head: Normocephalic and atraumatic Eyes:  sclerae anicteric, EOMI Ears: Normal auditory acuity Mouth: No deformity or lesions Lungs: Clear throughout to auscultation Heart: Regular rate and rhythm; no murmurs, rubs or bruits Abdomen: Soft, non tender and non distended. No masses, hepatosplenomegaly or hernias noted. Normal Bowel sounds Musculoskeletal: Symmetrical with no gross deformities  Pulses:  Normal pulses noted Extremities: No clubbing, cyanosis, edema or deformities noted Neurological: Alert oriented x 4, grossly nonfocal Psychological:  Alert and cooperative. Normal mood and affect  Assessment and Recommendations:  1. Internal and external hemorrhoids leading to small volume hematochezia. Standard rectal and hemorrhoidal care instructions. Anusol-HC suppositories and HC cream qd or bid. If bleeding is not able to be adequately controlled will proceed with surgical referral for  further management.  2 Multifactorial anemia, iron deficiency and chronic disease, followed by Dr. Marko Plume. Ongoing hematochezia may be contributing.  3. Mild constipation. Continue Colace twice daily. MiraLax once every day or every other day titrated for adequate relief of constipation.

## 2013-06-02 NOTE — Patient Instructions (Signed)
Take over the counter Miralax mixing 17 grams in 8 oz of water once daily or every other day.  We have sent the following medications to your pharmacy for you to pick up at your convenience: Hydrocortisone suppositories and cream.  RECTAL CARE INSTRUCTIONS:  1. Sitz Baths twice a day for 10 minutes each. 2. Thoroughly clean and dry the rectum. 3. Put Tucks pad against the rectum at night. 4. Clean the rectum with Balenol lotion after each bowel movement.  Thank you for choosing me and [facility].  Pricilla Riffle. Dagoberto Ligas., MD., Marval Regal

## 2013-06-09 DIAGNOSIS — M545 Low back pain, unspecified: Secondary | ICD-10-CM | POA: Diagnosis not present

## 2013-06-09 DIAGNOSIS — M069 Rheumatoid arthritis, unspecified: Secondary | ICD-10-CM | POA: Diagnosis not present

## 2013-06-17 DIAGNOSIS — D649 Anemia, unspecified: Secondary | ICD-10-CM | POA: Diagnosis not present

## 2013-07-06 ENCOUNTER — Other Ambulatory Visit: Payer: Self-pay | Admitting: Oncology

## 2013-07-09 ENCOUNTER — Ambulatory Visit (HOSPITAL_BASED_OUTPATIENT_CLINIC_OR_DEPARTMENT_OTHER): Payer: Medicare Other | Admitting: Oncology

## 2013-07-09 ENCOUNTER — Other Ambulatory Visit (HOSPITAL_BASED_OUTPATIENT_CLINIC_OR_DEPARTMENT_OTHER): Payer: Medicare Other

## 2013-07-09 VITALS — BP 219/126 | HR 94 | Temp 98.7°F | Resp 18 | Ht <= 58 in | Wt 122.3 lb

## 2013-07-09 DIAGNOSIS — K649 Unspecified hemorrhoids: Secondary | ICD-10-CM

## 2013-07-09 DIAGNOSIS — G8929 Other chronic pain: Secondary | ICD-10-CM

## 2013-07-09 DIAGNOSIS — M069 Rheumatoid arthritis, unspecified: Secondary | ICD-10-CM | POA: Diagnosis not present

## 2013-07-09 DIAGNOSIS — D509 Iron deficiency anemia, unspecified: Secondary | ICD-10-CM

## 2013-07-09 DIAGNOSIS — E611 Iron deficiency: Secondary | ICD-10-CM

## 2013-07-09 DIAGNOSIS — N189 Chronic kidney disease, unspecified: Secondary | ICD-10-CM

## 2013-07-09 DIAGNOSIS — K2940 Chronic atrophic gastritis without bleeding: Secondary | ICD-10-CM

## 2013-07-09 LAB — CBC WITH DIFFERENTIAL/PLATELET
BASO%: 0.5 % (ref 0.0–2.0)
BASOS ABS: 0.1 10*3/uL (ref 0.0–0.1)
EOS ABS: 0 10*3/uL (ref 0.0–0.5)
EOS%: 0.1 % (ref 0.0–7.0)
HCT: 30.6 % — ABNORMAL LOW (ref 34.8–46.6)
HEMOGLOBIN: 10 g/dL — AB (ref 11.6–15.9)
LYMPH%: 3 % — ABNORMAL LOW (ref 14.0–49.7)
MCH: 30 pg (ref 25.1–34.0)
MCHC: 32.6 g/dL (ref 31.5–36.0)
MCV: 91.9 fL (ref 79.5–101.0)
MONO#: 0.5 10*3/uL (ref 0.1–0.9)
MONO%: 4.6 % (ref 0.0–14.0)
NEUT#: 9.5 10*3/uL — ABNORMAL HIGH (ref 1.5–6.5)
NEUT%: 91.8 % — AB (ref 38.4–76.8)
Platelets: 320 10*3/uL (ref 145–400)
RBC: 3.33 10*6/uL — ABNORMAL LOW (ref 3.70–5.45)
RDW: 13.6 % (ref 11.2–14.5)
WBC: 10.3 10*3/uL (ref 3.9–10.3)
lymph#: 0.3 10*3/uL — ABNORMAL LOW (ref 0.9–3.3)

## 2013-07-09 LAB — IRON AND TIBC CHCC
%SAT: 43 % (ref 21–57)
Iron: 91 ug/dL (ref 41–142)
TIBC: 211 ug/dL — AB (ref 236–444)
UIBC: 120 ug/dL (ref 120–384)

## 2013-07-09 LAB — FERRITIN CHCC: Ferritin: 217 ng/ml (ref 9–269)

## 2013-07-10 ENCOUNTER — Encounter: Payer: Self-pay | Admitting: Oncology

## 2013-07-10 NOTE — Progress Notes (Signed)
OFFICE PROGRESS NOTE   07/10/2013   Physicians:Pang, Delfino Lovett, M.Stark/ J.Henrene Pastor, J.Beekman, (Rosenbower   INTERVAL HISTORY:   Patient is seen, together with husband, in follow up of anemia which seems likely multifactorial including iron deficiency, renal insufficiency and chronic disease. Blood loss is from chronic hemorrhoidal bleeding and likely from multiple blood draws; she has atrophic gastritis, so likely does not absorb oral iron. She was transfused 2 units PRBCs in 02-2013 for Hgb 6.3, then had IV feraheme 1020 mg 03-18-13. She saw Dr Fuller Plan recently, with recommendations for management of the hemorrhoids and constipation. I have received labs from Dr Minna Antis, from 06-17-13 back to 11-2012. Creatinine has been 1.1 - 1.6, total protein 5.4 - 6 with albumin 2.5 - 3.0, bili and LFTs normal, Na normal.Hgb was 10.5 on 06-17-13 (as low as 4.5 in 12-2012). These labs will be scanned into EMR.  Patient continues to have hemorrhoidal bleeding with most bowel movements, tho this is not as much as previously. She has had occasional epistaxis, but is not aware of any other bleeding. She remains very debilitated from multiple significant medical problems, including RA, diabetes, asthma/ on home O2 without humidifier. She requires assistance with essentially all ADLs, husband very supportive but patient wishes she could be more active. They recently tried to stop Lyrica due to possible side effects; she has been on Lyrica for years for diabetic neuropathy in feet, and did not tolerate stopping that completely.    Review of systems as above, also: No fever or symptoms of infection. No energy, all she can manage to dress and get into car with assistance for drs appointment. Remainder of 10 point Review of Systems negative.  Objective:  Vital signs in last 24 hours:  BP 219/126  Pulse 94  Temp(Src) 98.7 F (37.1 C) (Oral)  Resp 18  Ht 4\' 6"  (1.372 m)  Wt 122 lb 4.8 oz (55.475 kg)  BMI 29.47 kg/m2 I am  not sure that this weight is accurate, down 9 lbs from Dec. Alert, oriented and appropriate. In [facility] with portable oxygen. Looks chronically ill, very debilitated.    HEENT:PERRL, sclerae not icteric. Oral mucosa moist without lesions, posterior pharynx clear.  Neck supple. No JVD.  Lymphatics:no cervical,suraclavicular adenopathy Resp: clear to auscultation bilaterally and normal percussion bilaterally. No wheeze or rales. Cardio: regular rate and rhythm. No gallop. GI: soft, nontender, not distended. Some bowel sounds.   Musculoskeletal/ Extremities: trace pedal edema bilaterally without cords or tenderness. RA joint changes. Moves all extremities in WC. Psych appropriate mood and affect. Skin without rash, ecchymosis, petechiae   Lab Results:  Results for orders placed in visit on 07/09/13  CBC WITH DIFFERENTIAL      Result Value Ref Range   WBC 10.3  3.9 - 10.3 10e3/uL   NEUT# 9.5 (*) 1.5 - 6.5 10e3/uL   HGB 10.0 (*) 11.6 - 15.9 g/dL   HCT 30.6 (*) 34.8 - 46.6 %   Platelets 320  145 - 400 10e3/uL   MCV 91.9  79.5 - 101.0 fL   MCH 30.0  25.1 - 34.0 pg   MCHC 32.6  31.5 - 36.0 g/dL   RBC 3.33 (*) 3.70 - 5.45 10e6/uL   RDW 13.6  11.2 - 14.5 %   lymph# 0.3 (*) 0.9 - 3.3 10e3/uL   MONO# 0.5  0.1 - 0.9 10e3/uL   Eosinophils Absolute 0.0  0.0 - 0.5 10e3/uL   Basophils Absolute 0.1  0.0 - 0.1 10e3/uL   NEUT% 91.8 (*)  38.4 - 76.8 %   LYMPH% 3.0 (*) 14.0 - 49.7 %   MONO% 4.6  0.0 - 14.0 %   EOS% 0.1  0.0 - 7.0 %   BASO% 0.5  0.0 - 2.0 %  FERRITIN [facility]      Result Value Ref Range   Ferritin 217  9 - 269 ng/ml  IRON AND TIBC [facility]      Result Value Ref Range   Iron 91  41 - 142 ug/dL   TIBC 211 (*) 236 - 444 ug/dL   UIBC 120  120 - 384 ug/dL   %SAT 43  21 - 57 %   iron studies resulted after visit as above.  Studies/Results:  No results found.  Medications: I have reviewed the patient's current medications. She has restarted Lyrica 75 mg at hs (previously 75 AM and 150 at  hs). We have discussed constipation related to chronic hydrocodone. She continues anusol HC suppositories and (?miralax, not daily).  DISCUSSION: Anemia is improved since IV iron, still some bleeding and likely other factors contributing now, but reasonable range. She would like to have some follow up thru this office, so that I will see her at least in 6 months or sooner if she or other MDs request. I have suggested she might benefit from Pain Clinic consultation, and have also encouraged them to ask Dr Minna Antis about home PT/OT, as she has not had any PT in a number of years. The Pain Clinic referral would probably also be best from Dr Minna Antis, as I do not have much of her history in this regard.  Assessment/Plan: 1.Anemia: iron deficient likely from hemorrhoidal bleeding x months, multiple blood draws x years and inadequate oral absorption of iron, also may have element of anemia of chronic disease and/or anemia related to renal disease or Arava. Most recently transfused 2 units PRBCs at [facility] 16-10-96, without complications and had full dose IV iron dextran 03-18-13. Hemoglobin better/ in reasonable range today. Follow. 2.rheumatoid arthritis x 20 years, on long term daily prednisone.  3. Atrophic gastritis, hx adenomatous colon polyps, bleeding hemorrhoids: continuing to address hemorrhoids and constipation 4.diabetes on insulin and metformin, followed by Dr Minna Antis  5.history of esophageal stricture  6.asthma: on continuous O2 and home nebulizers  7.hx perirectal abscess, resolved with I&D  8.post cholecystectomy and abdominal hysterectomy  9.HTN, hx nonSTEMI  10.hx pericarditis  11.scoliosis and degenerative arthritis in spine + RA. 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   Gordy Levan, MD

## 2013-07-11 ENCOUNTER — Telehealth: Payer: Self-pay | Admitting: Oncology

## 2013-07-11 NOTE — Telephone Encounter (Signed)
PT TO BE CONTACTED W/10MO F/U W/LL SEPT 2015. SCHEDULE NOT AVAILABLE

## 2013-07-12 ENCOUNTER — Telehealth: Payer: Self-pay | Admitting: *Deleted

## 2013-07-12 NOTE — Telephone Encounter (Signed)
Results given to husband (patient unavailable) as indicated below. Was very pleased and will inform patient.

## 2013-07-12 NOTE — Telephone Encounter (Signed)
Message copied by Patton Salles on Mon Jul 12, 2013  2:52 PM ------      Message from: Evlyn Clines P      Created: Mon Jul 12, 2013  1:03 PM       Labs seen and need follow up: please let patient/ husband know that we did send iron studies last week -- I had not realized that these were done, but it is good to know that the IV iron definitely got her back up to good ranges. Serum iron is up to 91 (from 10), % saturation is good at 43 (from 3) and ferritin is good at 217 (from 19). Please tell them that I hope they will discuss home PT/OT and pain clinic referral with PCP. ------

## 2013-08-02 DIAGNOSIS — M545 Low back pain, unspecified: Secondary | ICD-10-CM | POA: Diagnosis not present

## 2013-08-02 DIAGNOSIS — M069 Rheumatoid arthritis, unspecified: Secondary | ICD-10-CM | POA: Diagnosis not present

## 2013-08-12 DIAGNOSIS — K922 Gastrointestinal hemorrhage, unspecified: Secondary | ICD-10-CM | POA: Diagnosis not present

## 2013-08-12 DIAGNOSIS — R7309 Other abnormal glucose: Secondary | ICD-10-CM | POA: Diagnosis not present

## 2013-08-12 DIAGNOSIS — IMO0001 Reserved for inherently not codable concepts without codable children: Secondary | ICD-10-CM | POA: Diagnosis not present

## 2013-08-12 DIAGNOSIS — I10 Essential (primary) hypertension: Secondary | ICD-10-CM | POA: Diagnosis not present

## 2013-08-18 ENCOUNTER — Other Ambulatory Visit: Payer: Self-pay | Admitting: Oncology

## 2013-08-19 DIAGNOSIS — D509 Iron deficiency anemia, unspecified: Secondary | ICD-10-CM | POA: Diagnosis not present

## 2013-08-20 ENCOUNTER — Telehealth: Payer: Self-pay

## 2013-08-20 ENCOUNTER — Telehealth: Payer: Self-pay | Admitting: Oncology

## 2013-08-20 ENCOUNTER — Other Ambulatory Visit: Payer: Self-pay

## 2013-08-20 ENCOUNTER — Ambulatory Visit (HOSPITAL_COMMUNITY)
Admission: RE | Admit: 2013-08-20 | Discharge: 2013-08-20 | Disposition: A | Discharge status: Home / Self Care | Payer: Medicare Other | Source: Ambulatory Visit | Attending: Oncology | Admitting: Oncology

## 2013-08-20 DIAGNOSIS — C44300 Unspecified malignant neoplasm of skin of unspecified part of face: Secondary | ICD-10-CM

## 2013-08-20 DIAGNOSIS — D649 Anemia, unspecified: Secondary | ICD-10-CM | POA: Insufficient documentation

## 2013-08-20 NOTE — Progress Notes (Signed)
Per phone encounter 08/20/13

## 2013-08-20 NOTE — Telephone Encounter (Signed)
Per request LL, spoke with pt to see how she is doing.  Pt reports headaches, tired, weakness, SOB, lack of energy, intermittent bleeding and tightness in her chest.  Discussed with patient blood transfusion - she is agreeable to early next week.  Also let patient know we would set her up for lab and appointment with Dr. Marko Plume in next two weeks.  Let pt know scheduling would contact her to set up both appointments.  Pt voiced understanding.    POF and orders entered.

## 2013-08-20 NOTE — Telephone Encounter (Signed)
, °

## 2013-08-22 ENCOUNTER — Other Ambulatory Visit: Payer: Self-pay | Admitting: Oncology

## 2013-08-23 ENCOUNTER — Telehealth: Payer: Self-pay

## 2013-08-23 NOTE — Telephone Encounter (Signed)
Spoke with [name] about receiving a blood transfusion today or tomorrow.  Marisa [name] prefers to receive the blood after her appointment with Dr. Marko Plume  on  08-26-13.  Told her that receiving earlier the transfusion may help her feel stronger and less SOB.  She states that she is currently breathing fine and wants to have visit and transfusion all in the same day.

## 2013-08-23 NOTE — Telephone Encounter (Signed)
Message copied by Baruch Merl on Mon Aug 23, 2013  1:21 PM ------      Message from: Gordy Levan      Created: Sun Aug 22, 2013  8:22 PM       I see Karna Christmas spoke with her last week, but I do not see transfusion PRBCs scheduled.      From Terri's note she sounded symptomatic.       I'll need to put in orders if we get PRBCs scheduled. I am to see her 5-13.      Thank you            Cc LA, TH, Michelle       ------

## 2013-08-24 ENCOUNTER — Other Ambulatory Visit: Payer: Medicare Other

## 2013-08-24 ENCOUNTER — Other Ambulatory Visit: Payer: Self-pay | Admitting: Oncology

## 2013-08-24 DIAGNOSIS — D500 Iron deficiency anemia secondary to blood loss (chronic): Secondary | ICD-10-CM

## 2013-08-26 ENCOUNTER — Other Ambulatory Visit: Payer: Self-pay | Admitting: *Deleted

## 2013-08-26 ENCOUNTER — Telehealth: Payer: Self-pay | Admitting: Oncology

## 2013-08-26 ENCOUNTER — Ambulatory Visit (HOSPITAL_BASED_OUTPATIENT_CLINIC_OR_DEPARTMENT_OTHER): Payer: Medicare Other

## 2013-08-26 ENCOUNTER — Other Ambulatory Visit (HOSPITAL_BASED_OUTPATIENT_CLINIC_OR_DEPARTMENT_OTHER): Payer: Medicare Other

## 2013-08-26 ENCOUNTER — Ambulatory Visit (HOSPITAL_BASED_OUTPATIENT_CLINIC_OR_DEPARTMENT_OTHER): Payer: Medicare Other | Admitting: Oncology

## 2013-08-26 ENCOUNTER — Encounter: Payer: Self-pay | Admitting: Oncology

## 2013-08-26 VITALS — BP 178/84 | HR 101 | Temp 98.4°F | Resp 18 | Ht <= 58 in | Wt 124.2 lb

## 2013-08-26 VITALS — BP 188/87 | HR 91 | Temp 98.1°F | Resp 16

## 2013-08-26 DIAGNOSIS — G8929 Other chronic pain: Secondary | ICD-10-CM | POA: Diagnosis not present

## 2013-08-26 DIAGNOSIS — D509 Iron deficiency anemia, unspecified: Secondary | ICD-10-CM | POA: Diagnosis not present

## 2013-08-26 DIAGNOSIS — K648 Other hemorrhoids: Secondary | ICD-10-CM

## 2013-08-26 DIAGNOSIS — C44300 Unspecified malignant neoplasm of skin of unspecified part of face: Secondary | ICD-10-CM

## 2013-08-26 DIAGNOSIS — D500 Iron deficiency anemia secondary to blood loss (chronic): Secondary | ICD-10-CM

## 2013-08-26 DIAGNOSIS — D649 Anemia, unspecified: Secondary | ICD-10-CM | POA: Diagnosis not present

## 2013-08-26 LAB — CBC WITH DIFFERENTIAL/PLATELET
BASO%: 0.4 % (ref 0.0–2.0)
BASOS ABS: 0.1 10*3/uL (ref 0.0–0.1)
EOS%: 0.8 % (ref 0.0–7.0)
Eosinophils Absolute: 0.1 10*3/uL (ref 0.0–0.5)
HEMATOCRIT: 25.6 % — AB (ref 34.8–46.6)
HGB: 8.3 g/dL — ABNORMAL LOW (ref 11.6–15.9)
LYMPH#: 0.5 10*3/uL — AB (ref 0.9–3.3)
LYMPH%: 3.3 % — ABNORMAL LOW (ref 14.0–49.7)
MCH: 29.7 pg (ref 25.1–34.0)
MCHC: 32.4 g/dL (ref 31.5–36.0)
MCV: 91.9 fL (ref 79.5–101.0)
MONO#: 0.6 10*3/uL (ref 0.1–0.9)
MONO%: 4.5 % (ref 0.0–14.0)
NEUT#: 12.6 10*3/uL — ABNORMAL HIGH (ref 1.5–6.5)
NEUT%: 91 % — AB (ref 38.4–76.8)
Platelets: 332 10*3/uL (ref 145–400)
RBC: 2.79 10*6/uL — ABNORMAL LOW (ref 3.70–5.45)
RDW: 15 % — ABNORMAL HIGH (ref 11.2–14.5)
WBC: 13.8 10*3/uL — ABNORMAL HIGH (ref 3.9–10.3)

## 2013-08-26 MED ORDER — ACETAMINOPHEN 325 MG PO TABS
650.0000 mg | ORAL_TABLET | Freq: Once | ORAL | Status: AC
  Administered 2013-08-26: 650 mg via ORAL

## 2013-08-26 MED ORDER — SODIUM CHLORIDE 0.9 % IV SOLN
250.0000 mL | Freq: Once | INTRAVENOUS | Status: AC
  Administered 2013-08-26: 250 mL via INTRAVENOUS

## 2013-08-26 MED ORDER — HYDROCORTISONE ACETATE 25 MG RE SUPP: RECTAL | Status: DC

## 2013-08-26 MED ORDER — ACETAMINOPHEN 325 MG PO TABS
ORAL_TABLET | ORAL | Status: AC
  Filled 2013-08-26: qty 2

## 2013-08-26 NOTE — Progress Notes (Signed)
OFFICE PROGRESS NOTE   08/26/2013   Physicians:R.Minna Antis, M.Wynell Balloon, (Rosenbower)  INTERVAL HISTORY:  Patient is seen, together with husband, earlier than scheduled 6 month visit due to progressive anemia on recent labs by PCP. Baseline performance status is poor (~ ECOG 3), however she seems to have been more weak overall in past few weeks. She continues to have generally small volume blood with bowel movements daily, which is known to be from hemorrhoids. She denies other bleeding recently (has had epistaxis in past).  She was last transfused 2 units PRBCs in Nov 2014 for hemoglobin down to 6.3; she had IV feraheme 1020 mg 03-18-13. Iron studies 06-2013 had serum iron 91 and %sat 43, with ferritin 217, all much improved compared with prior to IV iron. PT and PTT were not elevated. Erythropoietin level was 104 in 02-2013, and B12 and folate normal 01-2013.  She had stopped oral iron after IV iron given, however this was resumed recently by PCP. Anusol HC suppositories from Dr Fuller Plan were helpful previously, however she has been out of these.    Review of systems as above, also: Low pelvic pain when bowels move. Intermittent pain right posterior skull when she hit head in Sept 2014. Chronic discomfort from RA. On continuous home O2. Appetite at baseline.No fever or symptoms of infection. Easy bruising with daily prednisone Remainder of 10 point Review of Systems negative.  They have not gotten to pain clinic yet. I do not believe home PT as yet considered.  Objective:  Vital signs in last 24 hours:  BP 178/84  Pulse 101  Temp(Src) 98.4 F (36.9 C) (Oral)  Resp 18  Ht 4\' 6"  (1.372 m)  Wt 124 lb 3.2 oz (56.337 kg)  BMI 29.93 kg/m2  Weight up 2 lbs  Alert, oriented and appropriate.In wheelchair on portable O2.Very frail, NAD, respirations not labored at rest. Husband very supportive.  HEENT:PERRL, sclerae not icteric. Oral mucosa moist without lesions, posterior pharynx clear. No  swelling or other findings posterior skull Neck supple. No JVD.  Lymphatics:no cervical,suraclavicular adenopathy Resp: diminished BS otherwise clear to auscultation bilaterally Cardio: regular rate and rhythm. No gallop. GI: soft, nontender, not distended, no mass or organomegaly. Some bowel sounds.  Musculoskeletal/ Extremities: without pitting edema, cords, tenderness. Joint changes with RA Neuro: speech fluent, moves all extremities Skin scattered ecchymoses on arms, no petechiae, no rash.   Lab Results:  Results for orders placed in visit on 08/26/13  CBC WITH DIFFERENTIAL      Result Value Ref Range   WBC 13.8 (*) 3.9 - 10.3 10e3/uL   NEUT# 12.6 (*) 1.5 - 6.5 10e3/uL   HGB 8.3 (*) 11.6 - 15.9 g/dL   HCT 25.6 (*) 34.8 - 46.6 %   Platelets 332  145 - 400 10e3/uL   MCV 91.9  79.5 - 101.0 fL   MCH 29.7  25.1 - 34.0 pg   MCHC 32.4  31.5 - 36.0 g/dL   RBC 2.79 (*) 3.70 - 5.45 10e6/uL   RDW 15.0 (*) 11.2 - 14.5 %   lymph# 0.5 (*) 0.9 - 3.3 10e3/uL   MONO# 0.6  0.1 - 0.9 10e3/uL   Eosinophils Absolute 0.1  0.0 - 0.5 10e3/uL   Basophils Absolute 0.1  0.0 - 0.1 10e3/uL   NEUT% 91.0 (*) 38.4 - 76.8 %   LYMPH% 3.3 (*) 14.0 - 49.7 %   MONO% 4.5  0.0 - 14.0 %   EOS% 0.8  0.0 - 7.0 %   BASO%  0.4  0.0 - 2.0 %    Last hemoglobin at this office 10 on 07-09-13.   Studies/Results:  No results found.  Medications: I have reviewed the patient's current medications.  DISCUSSION: patient and husband are in agreement with transfusion of 1 unit PRBCs today. I will see her back in ~ 4-6 weeks for follow up CBC and will repeat iron studies then  Assessment/Plan: 1.Anemia: previously iron deficient likely from hemorrhoidal bleeding x months, multiple blood draws x years and inadequate oral absorption of iron, also may have element of anemia of chronic disease and/ or Arava. Erythropoietin level elevated last fall. Had full dose IV iron dextran 03-18-13. Daily bleeding from hemorrhoids likely  cause of further drop now. Will transfuse 1 unit PRBCs today and follow up labs including repeat iron studies as above. 2.rheumatoid arthritis x 20 years, on long term daily prednisone.  3. Atrophic gastritis, hx adenomatous colon polyps, bleeding hemorrhoids: followed by Dr Fuller Plan 4.diabetes on insulin and metformin, followed by Dr Minna Antis  5.history of esophageal stricture  6.asthma: on continuous O2 and home nebulizers  7.hx perirectal abscess, resolved with I&D  8.post cholecystectomy and abdominal hysterectomy  9.HTN, hx nonSTEMI  10.hx pericarditis  11.scoliosis and degenerative arthritis in spine + RA. 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

## 2013-08-26 NOTE — Patient Instructions (Signed)
Blood Transfusion Information WHAT IS A BLOOD TRANSFUSION? A transfusion is the replacement of blood or some of its parts. Blood is made up of multiple cells which provide different functions.  Red blood cells carry oxygen and are used for blood loss replacement.  White blood cells fight against infection.  Platelets control bleeding.  Plasma helps clot blood.  Other blood products are available for specialized needs, such as hemophilia or other clotting disorders. BEFORE THE TRANSFUSION  Who gives blood for transfusions?   You may be able to donate blood to be used at a later date on yourself (autologous donation).  Relatives can be asked to donate blood. This is generally not any safer than if you have received blood from a stranger. The same precautions are taken to ensure safety when a relative's blood is donated.  Healthy volunteers who are fully evaluated to make sure their blood is safe. This is blood bank blood. Transfusion therapy is the safest it has ever been in the practice of medicine. Before blood is taken from a donor, a complete history is taken to make sure that person has no history of diseases nor engages in risky social behavior (examples are intravenous drug use or sexual activity with multiple partners). The donor's travel history is screened to minimize risk of transmitting infections, such as malaria. The donated blood is tested for signs of infectious diseases, such as HIV and hepatitis. The blood is then tested to be sure it is compatible with you in order to minimize the chance of a transfusion reaction. If you or a relative donates blood, this is often done in anticipation of surgery and is not appropriate for emergency situations. It takes many days to process the donated blood. RISKS AND COMPLICATIONS Although transfusion therapy is very safe and saves many lives, the main dangers of transfusion include:   Getting an infectious disease.  Developing a  transfusion reaction. This is an allergic reaction to something in the blood you were given. Every precaution is taken to prevent this. The decision to have a blood transfusion has been considered carefully by your caregiver before blood is given. Blood is not given unless the benefits outweigh the risks. AFTER THE TRANSFUSION  Right after receiving a blood transfusion, you will usually feel much better and more energetic. This is especially true if your red blood cells have gotten low (anemic). The transfusion raises the level of the red blood cells which carry oxygen, and this usually causes an energy increase.  The nurse administering the transfusion will monitor you carefully for complications. HOME CARE INSTRUCTIONS  No special instructions are needed after a transfusion. You may find your energy is better. Speak with your caregiver about any limitations on activity for underlying diseases you may have. SEEK MEDICAL CARE IF:   Your condition is not improving after your transfusion.  You develop redness or irritation at the intravenous (IV) site. SEEK IMMEDIATE MEDICAL CARE IF:  Any of the following symptoms occur over the next 12 hours:  Shaking chills.  You have a temperature by mouth above 102 F (38.9 C), not controlled by medicine.  Chest, back, or muscle pain.  People around you feel you are not acting correctly or are confused.  Shortness of breath or difficulty breathing.  Dizziness and fainting.  You get a rash or develop hives.  You have a decrease in urine output.  Your urine turns a dark color or changes to pink, red, or brown. Any of the following   symptoms occur over the next 10 days:  You have a temperature by mouth above 102 F (38.9 C), not controlled by medicine.  Shortness of breath.  Weakness after normal activity.  The white part of the eye turns yellow (jaundice).  You have a decrease in the amount of urine or are urinating less often.  Your  urine turns a dark color or changes to pink, red, or brown. Document Released: 03/29/2000 Document Revised: 06/24/2011 Document Reviewed: 11/16/2007 Exit[employer] Patient Information 2014 [employer].  

## 2013-08-26 NOTE — Telephone Encounter (Signed)
, °

## 2013-08-27 LAB — TYPE AND SCREEN
ABO/RH(D): O POS
Antibody Screen: NEGATIVE
UNIT DIVISION: 0
Unit division: 0

## 2013-09-27 DIAGNOSIS — K649 Unspecified hemorrhoids: Secondary | ICD-10-CM | POA: Diagnosis not present

## 2013-09-27 DIAGNOSIS — D649 Anemia, unspecified: Secondary | ICD-10-CM | POA: Diagnosis not present

## 2013-09-29 ENCOUNTER — Ambulatory Visit (INDEPENDENT_AMBULATORY_CARE_PROVIDER_SITE_OTHER): Payer: Medicare Other | Admitting: General Surgery

## 2013-09-29 ENCOUNTER — Encounter: Payer: Self-pay | Admitting: Oncology

## 2013-09-29 ENCOUNTER — Telehealth: Payer: Self-pay

## 2013-09-29 NOTE — Telephone Encounter (Signed)
Spoke with patient.  Dr. Minna Antis did not do anything about the low hemoglobin - no transfusion given.  Pt says she just did a sitz bath and there was a very small amount of blood.  She says she has had a transfusion before and does not feel like she needs one at this time.    Routed to Dr. Marko Plume.

## 2013-09-29 NOTE — Progress Notes (Signed)
Medical Oncology  Labs received now by fax from Dr Tommy Medal, dated 09-27-13. WBC 13, Hgb 7.8, plt 365.  Next apt with this MD is 10-06-13 RN to call to see if Dr Minna Antis has done anything about the low Hgb, how much bleeding, and to see if she needs transfusion at [facility] prior to 10-06-13 apt.  Godfrey Pick, MD

## 2013-10-03 ENCOUNTER — Other Ambulatory Visit: Payer: Self-pay | Admitting: Oncology

## 2013-10-06 ENCOUNTER — Encounter: Payer: Self-pay | Admitting: Oncology

## 2013-10-06 ENCOUNTER — Ambulatory Visit (HOSPITAL_BASED_OUTPATIENT_CLINIC_OR_DEPARTMENT_OTHER): Payer: Medicare Other | Admitting: Oncology

## 2013-10-06 ENCOUNTER — Other Ambulatory Visit (HOSPITAL_BASED_OUTPATIENT_CLINIC_OR_DEPARTMENT_OTHER): Payer: Medicare Other

## 2013-10-06 ENCOUNTER — Telehealth: Payer: Self-pay | Admitting: Oncology

## 2013-10-06 VITALS — BP 129/75 | HR 79 | Temp 97.9°F | Resp 17 | Ht <= 58 in | Wt 125.2 lb

## 2013-10-06 DIAGNOSIS — E119 Type 2 diabetes mellitus without complications: Secondary | ICD-10-CM

## 2013-10-06 DIAGNOSIS — G8929 Other chronic pain: Secondary | ICD-10-CM

## 2013-10-06 DIAGNOSIS — I129 Hypertensive chronic kidney disease with stage 1 through stage 4 chronic kidney disease, or unspecified chronic kidney disease: Secondary | ICD-10-CM | POA: Diagnosis not present

## 2013-10-06 DIAGNOSIS — D500 Iron deficiency anemia secondary to blood loss (chronic): Secondary | ICD-10-CM

## 2013-10-06 DIAGNOSIS — N189 Chronic kidney disease, unspecified: Secondary | ICD-10-CM

## 2013-10-06 DIAGNOSIS — M069 Rheumatoid arthritis, unspecified: Secondary | ICD-10-CM | POA: Diagnosis not present

## 2013-10-06 LAB — IRON AND TIBC CHCC
%SAT: 15 % — ABNORMAL LOW (ref 21–57)
Iron: 40 ug/dL — ABNORMAL LOW (ref 41–142)
TIBC: 271 ug/dL (ref 236–444)
UIBC: 231 ug/dL (ref 120–384)

## 2013-10-06 LAB — CBC WITH DIFFERENTIAL/PLATELET
BASO%: 0.2 % (ref 0.0–2.0)
BASOS ABS: 0 10*3/uL (ref 0.0–0.1)
EOS%: 1.5 % (ref 0.0–7.0)
Eosinophils Absolute: 0.2 10*3/uL (ref 0.0–0.5)
HCT: 26.6 % — ABNORMAL LOW (ref 34.8–46.6)
HGB: 8.4 g/dL — ABNORMAL LOW (ref 11.6–15.9)
LYMPH#: 0.6 10*3/uL — AB (ref 0.9–3.3)
LYMPH%: 4.9 % — ABNORMAL LOW (ref 14.0–49.7)
MCH: 28.4 pg (ref 25.1–34.0)
MCHC: 31.4 g/dL — ABNORMAL LOW (ref 31.5–36.0)
MCV: 90.5 fL (ref 79.5–101.0)
MONO#: 0.8 10*3/uL (ref 0.1–0.9)
MONO%: 6.3 % (ref 0.0–14.0)
NEUT%: 87.1 % — ABNORMAL HIGH (ref 38.4–76.8)
NEUTROS ABS: 10.7 10*3/uL — AB (ref 1.5–6.5)
Platelets: 342 10*3/uL (ref 145–400)
RBC: 2.94 10*6/uL — ABNORMAL LOW (ref 3.70–5.45)
RDW: 14.6 % — AB (ref 11.2–14.5)
WBC: 12.3 10*3/uL — ABNORMAL HIGH (ref 3.9–10.3)

## 2013-10-06 LAB — FERRITIN CHCC: Ferritin: 45 ng/ml (ref 9–269)

## 2013-10-06 NOTE — Progress Notes (Signed)
OFFICE PROGRESS NOTE   10/06/2013   Physicians:R.Minna Antis, M.Wynell Balloon, (Rosenbower). New patient appointment with Dr Eleanora Neighbor 10-13-13   INTERVAL HISTORY:  Patient is seen, together with husband, in follow up of multifactorial anemia which has required PRBCs and for which she had feraheme 1020 mg in 03-2013. We had contacted her ~ a week ago after receiving labs from PCP Dr Minna Antis dated 09-27-13, with Hgb 7.8, WBC 13 and plt 365k; she did not feel she needed PRBCs then. Bleeding from hemorrhoids has improved in past few weeks since she has done sitz baths bid and used anusol HC suppositories. She is to see general surgery as new patient for this problem upcoming, which was the incentive for her to do sitz baths regularly, as previously recommended.  She has had no other bleeding. Chronic, significant medical problems seem stable and she does not feel that she is more SOB or more weak than baseline (uses home O2, very poor PS).  Past evaluation for anemia: iron studies 06-2013 after feraheme had serum iron 91 with %sat 43, erythropoietin 104 in 02-2013 and B12/folate normal in 01-2013.    Review of systems as above, also: No fever or symptoms of infection. Neck pain improved x3 days after accupuncture by Dr Minna Antis. Episode of disorientation x1 when up to BR during night this week. Remainder of 10 point Review of Systems negative.  Objective:  Vital signs in last 24 hours:  BP 129/75  Pulse 79  Temp(Src) 97.9 F (36.6 C) (Oral)  Resp 17  Ht 4\' 6"  (1.372 m)  Wt 125 lb 3.2 oz (56.79 kg)  BMI 30.17 kg/m2 weight is up 3 lbs  Alert, oriented and appropriate, very pleasant as always. In [place] with [place] O2. Appears very frail.  HEENT:PERRL, sclerae not icteric. Oral mucosa moist without lesions, posterior pharynx clear.  No JVD.  Lymphatics:nosuraclavicular adenopathy Resp: respirations not labored clear to auscultation bilaterally Cardio: regular rate and rhythm. No gallop. GI: soft,  nontender, not distended. Some bowel sounds.  Musculoskeletal/ Extremities: without pitting edema, cords, tenderness. Joint deformities of RA Neuro: no peripheral neuropathy. Otherwise nonfocal Skin without rash   Lab Results:  Results for orders placed in visit on 10/06/13  CBC WITH DIFFERENTIAL      Result Value Ref Range   WBC 12.3 (*) 3.9 - 10.3 10e3/uL   NEUT# 10.7 (*) 1.5 - 6.5 10e3/uL   HGB 8.4 (*) 11.6 - 15.9 g/dL   HCT 26.6 (*) 34.8 - 46.6 %   Platelets 342  145 - 400 10e3/uL   MCV 90.5  79.5 - 101.0 fL   MCH 28.4  25.1 - 34.0 pg   MCHC 31.4 (*) 31.5 - 36.0 g/dL   RBC 2.94 (*) 3.70 - 5.45 10e6/uL   RDW 14.6 (*) 11.2 - 14.5 %   lymph# 0.6 (*) 0.9 - 3.3 10e3/uL   MONO# 0.8  0.1 - 0.9 10e3/uL   Eosinophils Absolute 0.2  0.0 - 0.5 10e3/uL   Basophils Absolute 0.0  0.0 - 0.1 10e3/uL   NEUT% 87.1 (*) 38.4 - 76.8 %   LYMPH% 4.9 (*) 14.0 - 49.7 %   MONO% 6.3  0.0 - 14.0 %   EOS% 1.5  0.0 - 7.0 %   BASO% 0.2  0.0 - 2.0 %   iron studies available after visit with serum iron down to 40 and %sat 15  Studies/Results:  No results found.  Medications: I have reviewed the patient's current medications.  DISCUSSION Will contact  patient with results of iron studies, likely lower again with the chronic bleeding apparently from hemorrhoids.   Assessment/Plan:  1.Anemia: previously iron deficient likely from hemorrhoidal bleeding x months, multiple blood draws x years and inadequate oral absorption of iron, also may have element of anemia of chronic disease and/ or Arava. Erythropoietin level elevated last fall. Had full dose IV iron dextran 03-18-13. Daily bleeding from hemorrhoids likely cause of further drop now. Would be reasonable to repeat the IV iron in next few months if she would like. 2.rheumatoid arthritis x 20 years, on long term daily prednisone.  3. Atrophic gastritis, hx adenomatous colon polyps, bleeding hemorrhoids: followed by Dr Fuller Plan  4.diabetes on insulin and  metformin, followed by Dr Minna Antis  5.history of esophageal stricture  6.asthma: on continuous O2 and home nebulizers  7.hx perirectal abscess, resolved with I&D  8.post cholecystectomy and abdominal hysterectomy  9.HTN, hx nonSTEMI  10.hx pericarditis  11.scoliosis and degenerative arthritis in spine + RA. 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  Time spent 25 min including >50% counseling and coordinatiion of care.   Beck Cofer P, MD   10/06/2013, 1:47 PM

## 2013-10-06 NOTE — Telephone Encounter (Signed)
per pof to sch pt appts-sch & gave pt appt

## 2013-10-08 ENCOUNTER — Telehealth: Payer: Self-pay

## 2013-10-08 NOTE — Telephone Encounter (Signed)
Message copied by Baruch Merl on Fri Oct 08, 2013  7:45 PM ------      Message from: Evlyn Clines P      Created: Thu Oct 07, 2013  5:57 PM       Labs seen and need follow up: please let her know the iron levels have dropped down considerably since she last had feraheme in 03-2013. If she would like, would be reasonable to repeat the feraheme in next 1-2 months. Please schedule if she wants and let me know date to put in orders  thanks ------

## 2013-10-08 NOTE — Telephone Encounter (Signed)
Spoke with MR. and Mrs. Marisa [name] about the low inron levels as noted below by Dr. Marko Plume. Marisa [name] would like to hav another iron infusion.  She would like to have it sooner then later. Early afternoon would be good for the patient. Orders sent to the schedulers,

## 2013-10-11 ENCOUNTER — Telehealth: Payer: Self-pay | Admitting: Oncology

## 2013-10-11 NOTE — Telephone Encounter (Signed)
s.w. pt and advised on July appt....pt ok and aware °

## 2013-10-13 ENCOUNTER — Ambulatory Visit (INDEPENDENT_AMBULATORY_CARE_PROVIDER_SITE_OTHER): Payer: Medicare Other | Admitting: Surgery

## 2013-10-13 ENCOUNTER — Ambulatory Visit (INDEPENDENT_AMBULATORY_CARE_PROVIDER_SITE_OTHER): Payer: Medicare Other | Admitting: General Surgery

## 2013-10-14 ENCOUNTER — Other Ambulatory Visit: Payer: Self-pay | Admitting: Oncology

## 2013-10-18 ENCOUNTER — Other Ambulatory Visit: Payer: Self-pay | Admitting: Internal Medicine

## 2013-10-18 DIAGNOSIS — R410 Disorientation, unspecified: Secondary | ICD-10-CM

## 2013-10-18 DIAGNOSIS — R404 Transient alteration of awareness: Secondary | ICD-10-CM | POA: Diagnosis not present

## 2013-10-18 DIAGNOSIS — F29 Unspecified psychosis not due to a substance or known physiological condition: Secondary | ICD-10-CM | POA: Diagnosis not present

## 2013-10-18 DIAGNOSIS — F411 Generalized anxiety disorder: Secondary | ICD-10-CM | POA: Diagnosis not present

## 2013-10-21 ENCOUNTER — Ambulatory Visit
Admission: RE | Admit: 2013-10-21 | Discharge: 2013-10-21 | Disposition: A | Discharge status: Home / Self Care | Payer: Medicare Other | Source: Ambulatory Visit | Attending: Internal Medicine | Admitting: Internal Medicine

## 2013-10-21 DIAGNOSIS — R410 Disorientation, unspecified: Secondary | ICD-10-CM

## 2013-10-21 DIAGNOSIS — R404 Transient alteration of awareness: Secondary | ICD-10-CM | POA: Diagnosis not present

## 2013-10-22 ENCOUNTER — Other Ambulatory Visit: Payer: Self-pay | Admitting: *Deleted

## 2013-10-22 ENCOUNTER — Ambulatory Visit (HOSPITAL_BASED_OUTPATIENT_CLINIC_OR_DEPARTMENT_OTHER): Payer: Medicare Other

## 2013-10-22 VITALS — BP 174/96 | HR 85 | Temp 98.5°F | Resp 18

## 2013-10-22 DIAGNOSIS — D500 Iron deficiency anemia secondary to blood loss (chronic): Secondary | ICD-10-CM

## 2013-10-22 MED ORDER — FERUMOXYTOL INJECTION 510 MG/17 ML
1020.0000 mg | Freq: Once | INTRAVENOUS | Status: AC
  Administered 2013-10-22: 1020 mg via INTRAVENOUS
  Filled 2013-10-22: qty 34

## 2013-10-22 MED ORDER — SODIUM CHLORIDE 0.9 % IV SOLN
Freq: Once | INTRAVENOUS | Status: AC
Start: 2013-10-22 — End: 2013-10-22
  Administered 2013-10-22: 12:00:00 via INTRAVENOUS

## 2013-10-22 NOTE — Progress Notes (Signed)
Patient observed 30 minutes after first time feraheme infusion. No problems or complications noted. Patient discharged home with husband.  Cindi Carbon, RN

## 2013-10-22 NOTE — Patient Instructions (Signed)

## 2013-11-01 DIAGNOSIS — G4700 Insomnia, unspecified: Secondary | ICD-10-CM | POA: Diagnosis not present

## 2013-11-01 DIAGNOSIS — R5383 Other fatigue: Secondary | ICD-10-CM | POA: Diagnosis not present

## 2013-11-01 DIAGNOSIS — R5381 Other malaise: Secondary | ICD-10-CM | POA: Diagnosis not present

## 2013-11-01 DIAGNOSIS — D509 Iron deficiency anemia, unspecified: Secondary | ICD-10-CM | POA: Diagnosis not present

## 2013-11-01 DIAGNOSIS — K649 Unspecified hemorrhoids: Secondary | ICD-10-CM | POA: Diagnosis not present

## 2013-11-02 ENCOUNTER — Ambulatory Visit (INDEPENDENT_AMBULATORY_CARE_PROVIDER_SITE_OTHER): Payer: BLUE CROSS/BLUE SHIELD | Admitting: General Surgery

## 2013-11-11 DIAGNOSIS — E119 Type 2 diabetes mellitus without complications: Secondary | ICD-10-CM | POA: Diagnosis not present

## 2013-11-11 DIAGNOSIS — G4700 Insomnia, unspecified: Secondary | ICD-10-CM | POA: Diagnosis not present

## 2013-11-11 DIAGNOSIS — M62838 Other muscle spasm: Secondary | ICD-10-CM | POA: Diagnosis not present

## 2013-11-15 ENCOUNTER — Ambulatory Visit (INDEPENDENT_AMBULATORY_CARE_PROVIDER_SITE_OTHER): Payer: Medicare Other | Admitting: General Surgery

## 2013-11-17 ENCOUNTER — Encounter (INDEPENDENT_AMBULATORY_CARE_PROVIDER_SITE_OTHER): Payer: Self-pay | Admitting: General Surgery

## 2013-11-17 ENCOUNTER — Ambulatory Visit (INDEPENDENT_AMBULATORY_CARE_PROVIDER_SITE_OTHER): Payer: Medicare Other | Admitting: General Surgery

## 2013-11-17 VITALS — BP 122/72 | HR 74 | Temp 98.0°F | Ht 59.0 in | Wt 116.0 lb

## 2013-11-17 DIAGNOSIS — K648 Other hemorrhoids: Secondary | ICD-10-CM | POA: Diagnosis not present

## 2013-11-17 DIAGNOSIS — K642 Third degree hemorrhoids: Secondary | ICD-10-CM

## 2013-11-17 NOTE — Progress Notes (Signed)
Chief Complaint  Patient presents with  . eval hems    HISTORY: Marisa [name] is a 73 y.o. female who presents to the office with rectal bleeding and anemia.  She is not having any symptoms currently.  This had been occurring for about a year.  she has tried colace and hemorrhoid suppositories in the past with good success.  Nothing makes the symptoms worse.   It is intermittent in nature.  her bowel habits are regular and her bowel movements are soft.  her fiber intake is dietary.  her last colonoscopy was 12/2012.    Past Medical History  Diagnosis Date  . Diabetic ketoacidosis 11/2010  . Myocardial infarction     Chest pain s/p normal cath in 08/2004 then NSTEMI during 11/2010 admission, negative Myoview  . Diabetes mellitus type 2, insulin dependent     initial diagnoses 11/2008  . GERD (gastroesophageal reflux disease)   . Esophageal stricture 06/2004    Dilation 06/2004  . Hypertension   . Supraventricular tachycardia   . history of  pericarditis 12/2002  . Pericardial effusion   . Rheumatoid arthritis(714.0)     On MTX and chronic steroids  . Osteoarthritis   . Asthma     Home 3L O2   . Seizure 11/2008  . Rectal bleeding 11/2010    Large hemorrhoids  . Chronic anemia   . History of blood transfusion   . Diverticulosis   . Anemia       Past Surgical History  Procedure Laterality Date  . Cholecystectomy    . Abdominal hysterectomy    . Incise and drain abcess  09/2011    I&D of peri-rectal abcess per Dr Zella Richer.   . Colonoscopy N/A 01/06/2013    Procedure: COLONOSCOPY;  Surgeon: Ladene Artist, MD;  Location[facility] ENDOSCOPY;  Service: Endoscopy;  Laterality: N/A;  . Esophagogastroduodenoscopy N/A 02/11/2013    Procedure: ESOPHAGOGASTRODUODENOSCOPY (EGD);  Surgeon: Irene Shipper, MD;  Location: [facility] ENDOSCOPY;  Service: Endoscopy;  Laterality: N/A;        Current Outpatient Prescriptions  Medication Sig Dispense Refill  . albuterol (PROVENTIL) (5 MG/ML) 0.5% nebulizer  solution Take 2.5 mg by nebulization every 6 (six) hours as needed for wheezing or shortness of breath.      . AMBULATORY NON FORMULARY MEDICATION Continuous O2 @@ 2.5-3 LMP      . clotrimazole-betamethasone (LOTRISONE) cream       . Coenzyme Q10 (CO Q 10) 100 MG CAPS Take 100 mg by mouth daily.      Marland Kitchen docusate sodium (COLACE) 100 MG capsule Take 100 mg by mouth daily.       Marland Kitchen esomeprazole (NEXIUM) 40 MG capsule Take 40 mg by mouth 2 (two) times daily.       Marland Kitchen estrogens, conjugated, (PREMARIN) 0.625 MG tablet Take 0.625 mg by mouth daily.       . folic acid (FOLVITE) 1 MG tablet Take 1 mg by mouth daily.        Marland Kitchen HYDROcodone-acetaminophen (NORCO/VICODIN) 5-325 MG per tablet Take 2 tablets by mouth every 6 (six) hours as needed for pain.      . hydrocortisone (ANUSOL-HC) 2.5 % rectal cream Place 1 application rectally daily.  30 g  1  . hydrocortisone (ANUSOL-HC) 25 MG suppository Insert one suppository daily as needed for hemorrhoids.  30 suppository  1  . insulin aspart (NOVOLOG FLEXPEN) 100 UNIT/ML injection Inject 3-12 Units into the skin 3 (three) times daily before meals.  Sliding scale      . insulin detemir (LEVEMIR FLEXPEN) 100 UNIT/ML injection Inject 22 Units into the skin daily at 12 noon.       . levalbuterol (XOPENEX HFA) 45 MCG/ACT inhaler Inhale 1-2 puffs into the lungs every 4 (four) hours as needed for wheezing.      Marland Kitchen lisinopril (PRINIVIL,ZESTRIL) 20 MG tablet Take 20 mg by mouth daily.      Marland Kitchen LORazepam (ATIVAN) 0.5 MG tablet Take 0.5 mg by mouth every 8 (eight) hours.      . metoprolol (TOPROL XL) 100 MG 24 hr tablet Take 100 mg by mouth daily.       . Multiple Vitamin (MULTIVITAMINS PO) Take 1 tablet by mouth. Daily.       . potassium chloride SA (K-DUR,KLOR-CON) 20 MEQ tablet Take 20 mEq by mouth 2 (two) times daily.       . predniSONE (DELTASONE) 10 MG tablet Take 10 mg by mouth daily.      . pregabalin (LYRICA) 75 MG capsule Take 75 mg by mouth daily. 1 IN THE AM AND 2 IN  THE PM      . PROCTOSOL HC 2.5 % rectal cream       . promethazine (PHENERGAN) 12.5 MG tablet Take 12.5 mg by mouth every 6 (six) hours as needed for nausea.      . sertraline (ZOLOFT) 50 MG tablet Take 50 mg by mouth daily.       . traMADol (ULTRAM) 50 MG tablet Take 50 mg by mouth every 6 (six) hours as needed.      . vitamin A 8000 UNIT capsule Take 8,000 Units by mouth daily.      . Vitamin D-Vitamin K (VITAMIN K2-VITAMIN D3 PO) Take 1 tablet by mouth daily.       No current facility-administered medications for this visit.      Allergies  Allergen Reactions  . Demerol [Meperidine] Other (See Comments)    Hallucinations  . Penicillins Swelling and Other (See Comments)    Throat swelling      Family History  Problem Relation Age of Onset  . Heart disease Mother   . Stroke Father     History   Social History  . Marital Status: Married    Spouse Name: N/A    Number of Children: N/A  . Years of Education: N/A   Social History Main Topics  . Smoking status: Never Smoker   . Smokeless tobacco: Never Used  . Alcohol Use: No  . Drug Use: No  . Sexual Activity: None   Other Topics Concern  . None   Social History Narrative   Pt lives at home with her husband and has a son and daughter. She is still ambulatory occasionally using a cane or walker. No toxic habits.       REVIEW OF SYSTEMS - PERTINENT POSITIVES ONLY: Review of Systems - General ROS: negative for - chills, fever or weight loss Hematological and Lymphatic ROS: negative for - bleeding problems, blood clots or bruising Respiratory ROS: no cough, shortness of breath, or wheezing Cardiovascular ROS: no chest pain or dyspnea on exertion Gastrointestinal ROS: positive for - blood in stools negative for - abdominal pain Genito-Urinary ROS: no dysuria, trouble voiding, or hematuria  EXAM: Filed Vitals:   11/17/13 1612  BP: 122/72  Pulse: 74  Temp: 98 F (36.7 C)    General appearance: alert and  cooperative Resp: clear to auscultation bilaterally Cardio: regular rate  and rhythm GI: soft, non-tender; bowel sounds normal; no masses,  no organomegaly  Procedure: Anoscopy Surgeon: Marcello Moores Diagnosis: anal bleeding  Assistant: Randel Books After the risks and benefits were explained, verbal consent was obtained for above procedure  Anesthesia: none Findings: grade 3 inflamed internal hemorrhoids.    ASSESSMENT AND PLAN: SKYLAN GIFT is a 73 y.o. female with rectal bleeding and anemia.  She has inflamed internal hemorrhoids, which could very well be the cause of her bleeding.  I have recommended [place] to control her internal hemorrhoids.  This is somewhat better tolerated than standard hemorrhoidectomy.  She would like to wait for now.  She will call us when she is ready to schedule surgery.      Rosario Adie, MD Colon and Rectal Surgery / [facility]      Visit Diagnoses: No diagnosis found.  Primary Care Physician: Tommy Medal, MD

## 2013-11-17 NOTE — Patient Instructions (Signed)
HEMORRHOIDS    Did you know... Hemorrhoids are one of the most common ailments known.  More than half the population will develop hemorrhoids, usually after age 73.  Millions of Americans currently suffer from hemorrhoids.  The average person suffers in silence for a long period before seeking medical care.  Today's treatment methods make some types of hemorrhoid removal much less painful.  What are hemorrhoids? Often described as "varicose veins of the anus and rectum", hemorrhoids are enlarged, bulging blood vessels in and about the anus and lower rectum. There are two types of hemorrhoids: external and internal, which refer to their location.  External (outside) hemorrhoids develop near the anus and are covered by very sensitive skin. These are usually painless. However, if a blood clot (thrombosis) develops in an external hemorrhoid, it becomes a painful, hard lump. The external hemorrhoid may bleed if it ruptures. Internal (inside) hemorrhoids develop within the anus beneath the lining. Painless bleeding and protrusion during bowel movements are the most common symptom. However, an internal hemorrhoid can cause severe pain if it is completely "prolapsed" - protrudes from the anal opening and cannot be pushed back inside.   What causes hemorrhoids? An exact cause is unknown; however, the upright posture of humans alone forces a great deal of pressure on the rectal veins, which sometimes causes them to bulge. Other contributing factors include:  . Aging  . Chronic constipation or diarrhea  . Pregnancy  . Heredity  . Straining during bowel movements  . Faulty bowel function due to overuse of laxatives or enemas . Spending long periods of time (e.g., reading) on the toilet  Whatever the cause, the tissues supporting the vessels stretch. As a result, the vessels dilate; their walls become thin and bleed. If the stretching and pressure continue, the weakened vessels protrude.  What are the  symptoms? If you notice any of the following, you could have hemorrhoids:  . Bleeding during bowel movements  . Protrusion during bowel movements . Itching in the anal area  . Pain  . Sensitive lump(s)  How are hemorrhoids treated? Mild symptoms can be relieved frequently by increasing the amount of fiber (e.g., fruits, vegetables, breads and cereals) and fluids in the diet. Eliminating excessive straining reduces the pressure on hemorrhoids and helps prevent them from protruding. A sitz bath - sitting in plain warm water for about 10 minutes - can also provide some relief . With these measures, the pain and swelling of most symptomatic hemorrhoids will decrease in two to seven days, and the firm lump should recede within four to six weeks. In cases of severe or persistent pain from a thrombosed hemorrhoid, your physician may elect to remove the hemorrhoid containing the clot with a small incision. Performed under local anesthesia as an outpatient, this procedure generally provides relief. Severe hemorrhoids may require special treatment, much of which can be performed on an outpatient basis.  . Ligation - the rubber band treatment - works effectively on internal hemorrhoids that protrude with bowel movements. A small rubber band is placed over the hemorrhoid, cutting off its blood supply. The hemorrhoid and the band fall off in a few days and the wound usually heals in a week or two. This procedure sometimes produces mild discomfort and bleeding and may need to be repeated for a full effect.  There is a more intense version of this procedure that is done in the OR as outpatient surgery called THD.  It involves identifying blood vessels leading to the   hemorrhoids and then tying them off with sutures.  This method is a little more painful than rubber band ligation but less painful than traditional hemorrhoidectomy and usually does not have to be repeated.  It is best for internal hemorrhoids that  bleed.  Rubber Band Ligation of Internal Hemorrhoids:  A.  Bulging, bleeding, internal hemorrhoid B.  Rubber band applied at the base of the hemorrhoid C.  About 7 days later, the banded hemorrhoid has fallen off leaving a small scar (arrow)  . Injection and Coagulation can also be used on bleeding hemorrhoids that do not protrude. Both methods are relatively painless and cause the hemorrhoid to shrivel up. . Hemorrhoidectomy - surgery to remove the hemorrhoids - is the most complete method for removal of internal and external hemorrhoids. It is necessary when (1) clots repeatedly form in external hemorrhoids; (2) ligation fails to treat internal hemorrhoids; (3) the protruding hemorrhoid cannot be reduced; or (4) there is persistent bleeding. A hemorrhoidectomy removes excessive tissue that causes the bleeding and protrusion. It is done under anesthesia using sutures, and may, depending upon circumstances, require hospitalization and a period of inactivity. Laser hemorrhoidectomies do not offer any advantage over standard operative techniques. They are also quite expensive, and contrary to popular belief, are no less painful.  Do hemorrhoids lead to cancer? No. There is no relationship between hemorrhoids and cancer. However, the symptoms of hemorrhoids, particularly bleeding, are similar to those of colorectal cancer and other diseases of the digestive system. Therefore, it is important that all symptoms are investigated by a physician specially trained in treating diseases of the colon and rectum and that everyone 50 years or older undergo screening tests for colorectal cancer. Do not rely on over-the-counter medications or other self-treatments. See a colorectal surgeon first so your symptoms can be properly evaluated and effective treatment prescribed.  2012 American Society of Colon & Rectal Surgeons      GETTING TO GOOD BOWEL HEALTH. Irregular bowel habits such as constipation can lead  to many problems over time.  Having one soft bowel movement a day is the most important way to prevent further problems.  The anorectal canal is designed to handle stretching and feces to safely manage our ability to get rid of solid waste (feces, poop, stool) out of our body.  BUT, hard constipated stools can act like ripping concrete bricks causing inflamed hemorrhoids, anal fissures, abdominal pain and bloating.     The goal: ONE SOFT BOWEL MOVEMENT A DAY!  To have soft, regular bowel movements:    Drink at least 8 tall glasses of water a day.     Take plenty of fiber.  Fiber is the undigested part of plant food that passes into the colon, acting s "natures broom" to encourage bowel motility and movement.  Fiber can absorb and hold large amounts of water. This results in a larger, bulkier stool, which is soft and easier to pass. Work gradually over several weeks up to 6 servings a day of fiber (25g a day even more if needed) in the form of: o Vegetables -- Root (potatoes, carrots, turnips), leafy green (lettuce, salad greens, celery, spinach), or cooked high residue (cabbage, broccoli, etc) o Fruit -- Fresh (unpeeled skin & pulp), Dried (prunes, apricots, cherries, etc ),  or stewed ( applesauce)  o Whole grain breads, pasta, etc (whole wheat)  o Bran cereals    Bulking Agents -- This type of water-retaining fiber generally is easily obtained each day by one   of the following:  o Psyllium bran -- The psyllium plant is remarkable because its ground seeds can retain so much water. This product is available as Metamucil, Konsyl, Effersyllium, Per Diem Fiber, or the less expensive generic preparation in drug and health food stores. Although labeled a laxative, it really is not a laxative.  o Methylcellulose -- This is another fiber derived from wood which also retains water. It is available as Citrucel. o Polyethylene Glycol - and "artificial" fiber commonly called Miralax or Glycolax.  It is helpful for  people with gassy or bloated feelings with regular fiber o Flax Seed - a less gassy fiber than psyllium   No reading or other relaxing activity while on the toilet. If bowel movements take longer than 5 minutes, you are too constipated.   AVOID CONSTIPATION.  High fiber and water intake usually takes care of this.  Sometimes a laxative is needed to stimulate more frequent bowel movements, but    Laxatives are not a good long-term solution as it can wear the colon out. o Osmotics (Milk of Magnesia, Fleets phosphosoda, Magnesium citrate, MiraLax, GoLytely) are safer than  o Stimulants (Senokot, Castor Oil, Dulcolax, Ex Lax)    o Do not take laxatives for more than 7days in a row.    IF SEVERELY CONSTIPATED, try a Bowel Retraining Program: o Do not use laxatives.  o Eat a diet high in roughage, such as bran cereals and leafy vegetables.  o Drink six (6) ounces of prune or apricot juice each morning.  o Eat two (2) large servings of stewed fruit each day.  o Take one (1) heaping tablespoon of a psyllium-based bulking agent twice a day. Use sugar-free sweetener when possible to avoid excessive calories.  o Eat a normal breakfast.  o Set aside 15 minutes after breakfast to sit on the toilet, but do not strain to have a bowel movement.  o If you do not have a bowel movement by the third day, use an enema and repeat the above steps.  Fiber Chart  You should 25-30g of fiber per day and drinking 8 glasses of water to help your bowels move regularly.  In the chart below you can look up how much fiber you are getting in an average day.  If you are not getting enough fiber, you should add a fiber supplement to your diet.  Examples of this include Metamucil, FiberCon and Citrucel.  These can be purchased at your local grocery store or pharmacy.      [email or web address]://reyes-guerrero.[email or web address]

## 2013-11-18 DIAGNOSIS — L538 Other specified erythematous conditions: Secondary | ICD-10-CM | POA: Diagnosis not present

## 2013-11-18 DIAGNOSIS — E119 Type 2 diabetes mellitus without complications: Secondary | ICD-10-CM | POA: Diagnosis not present

## 2013-11-18 DIAGNOSIS — Z01419 Encounter for gynecological examination (general) (routine) without abnormal findings: Secondary | ICD-10-CM | POA: Diagnosis not present

## 2013-11-18 DIAGNOSIS — N390 Urinary tract infection, site not specified: Secondary | ICD-10-CM | POA: Diagnosis not present

## 2013-11-18 DIAGNOSIS — B373 Candidiasis of vulva and vagina: Secondary | ICD-10-CM | POA: Diagnosis not present

## 2013-11-18 DIAGNOSIS — B3731 Acute candidiasis of vulva and vagina: Secondary | ICD-10-CM | POA: Diagnosis not present

## 2013-12-14 DIAGNOSIS — K625 Hemorrhage of anus and rectum: Secondary | ICD-10-CM | POA: Diagnosis not present

## 2013-12-14 DIAGNOSIS — R404 Transient alteration of awareness: Secondary | ICD-10-CM | POA: Diagnosis not present

## 2013-12-15 DIAGNOSIS — M069 Rheumatoid arthritis, unspecified: Secondary | ICD-10-CM | POA: Diagnosis not present

## 2013-12-15 DIAGNOSIS — M545 Low back pain, unspecified: Secondary | ICD-10-CM | POA: Diagnosis not present

## 2013-12-15 DIAGNOSIS — H5316 Psychophysical visual disturbances: Secondary | ICD-10-CM | POA: Diagnosis not present

## 2013-12-21 ENCOUNTER — Other Ambulatory Visit: Payer: Self-pay | Admitting: Internal Medicine

## 2013-12-21 DIAGNOSIS — R410 Disorientation, unspecified: Secondary | ICD-10-CM

## 2013-12-28 DIAGNOSIS — R404 Transient alteration of awareness: Secondary | ICD-10-CM | POA: Diagnosis not present

## 2013-12-31 ENCOUNTER — Ambulatory Visit
Admission: RE | Admit: 2013-12-31 | Discharge: 2013-12-31 | Disposition: A | Discharge status: Home / Self Care | Payer: Medicare Other | Source: Ambulatory Visit | Attending: Internal Medicine | Admitting: Internal Medicine

## 2013-12-31 DIAGNOSIS — R404 Transient alteration of awareness: Secondary | ICD-10-CM | POA: Diagnosis not present

## 2013-12-31 DIAGNOSIS — F29 Unspecified psychosis not due to a substance or known physiological condition: Secondary | ICD-10-CM | POA: Diagnosis not present

## 2013-12-31 DIAGNOSIS — R410 Disorientation, unspecified: Secondary | ICD-10-CM

## 2014-01-02 ENCOUNTER — Encounter (HOSPITAL_COMMUNITY): Payer: Self-pay | Admitting: Emergency Medicine

## 2014-01-02 ENCOUNTER — Inpatient Hospital Stay (HOSPITAL_COMMUNITY)
Admission: EM | Admit: 2014-01-02 | Discharge: 2014-01-12 | DRG: 637 | Disposition: A | Discharge status: Home Health | Payer: Medicare Other | Attending: Internal Medicine | Admitting: Internal Medicine

## 2014-01-02 DIAGNOSIS — I10 Essential (primary) hypertension: Secondary | ICD-10-CM | POA: Diagnosis not present

## 2014-01-02 DIAGNOSIS — E46 Unspecified protein-calorie malnutrition: Secondary | ICD-10-CM

## 2014-01-02 DIAGNOSIS — J9611 Chronic respiratory failure with hypoxia: Secondary | ICD-10-CM

## 2014-01-02 DIAGNOSIS — L0390 Cellulitis, unspecified: Secondary | ICD-10-CM

## 2014-01-02 DIAGNOSIS — Z794 Long term (current) use of insulin: Secondary | ICD-10-CM

## 2014-01-02 DIAGNOSIS — F3289 Other specified depressive episodes: Secondary | ICD-10-CM | POA: Diagnosis present

## 2014-01-02 DIAGNOSIS — R443 Hallucinations, unspecified: Secondary | ICD-10-CM | POA: Diagnosis not present

## 2014-01-02 DIAGNOSIS — Z79899 Other long term (current) drug therapy: Secondary | ICD-10-CM

## 2014-01-02 DIAGNOSIS — F411 Generalized anxiety disorder: Secondary | ICD-10-CM | POA: Diagnosis present

## 2014-01-02 DIAGNOSIS — F29 Unspecified psychosis not due to a substance or known physiological condition: Secondary | ICD-10-CM | POA: Diagnosis not present

## 2014-01-02 DIAGNOSIS — H5316 Psychophysical visual disturbances: Secondary | ICD-10-CM | POA: Diagnosis present

## 2014-01-02 DIAGNOSIS — E611 Iron deficiency: Secondary | ICD-10-CM

## 2014-01-02 DIAGNOSIS — D500 Iron deficiency anemia secondary to blood loss (chronic): Secondary | ICD-10-CM

## 2014-01-02 DIAGNOSIS — J449 Chronic obstructive pulmonary disease, unspecified: Secondary | ICD-10-CM

## 2014-01-02 DIAGNOSIS — Z86010 Personal history of colonic polyps: Secondary | ICD-10-CM

## 2014-01-02 DIAGNOSIS — K2970 Gastritis, unspecified, without bleeding: Secondary | ICD-10-CM

## 2014-01-02 DIAGNOSIS — G9340 Encephalopathy, unspecified: Secondary | ICD-10-CM | POA: Diagnosis not present

## 2014-01-02 DIAGNOSIS — Z880 Allergy status to penicillin: Secondary | ICD-10-CM

## 2014-01-02 DIAGNOSIS — R440 Auditory hallucinations: Secondary | ICD-10-CM

## 2014-01-02 DIAGNOSIS — D649 Anemia, unspecified: Secondary | ICD-10-CM | POA: Diagnosis not present

## 2014-01-02 DIAGNOSIS — I309 Acute pericarditis, unspecified: Secondary | ICD-10-CM

## 2014-01-02 DIAGNOSIS — M069 Rheumatoid arthritis, unspecified: Secondary | ICD-10-CM | POA: Diagnosis not present

## 2014-01-02 DIAGNOSIS — B9562 Methicillin resistant Staphylococcus aureus infection as the cause of diseases classified elsewhere: Secondary | ICD-10-CM

## 2014-01-02 DIAGNOSIS — IMO0002 Reserved for concepts with insufficient information to code with codable children: Secondary | ICD-10-CM

## 2014-01-02 DIAGNOSIS — N179 Acute kidney failure, unspecified: Secondary | ICD-10-CM | POA: Diagnosis present

## 2014-01-02 DIAGNOSIS — R0602 Shortness of breath: Secondary | ICD-10-CM

## 2014-01-02 DIAGNOSIS — I6789 Other cerebrovascular disease: Secondary | ICD-10-CM | POA: Diagnosis not present

## 2014-01-02 DIAGNOSIS — D126 Benign neoplasm of colon, unspecified: Secondary | ICD-10-CM

## 2014-01-02 DIAGNOSIS — E119 Type 2 diabetes mellitus without complications: Secondary | ICD-10-CM

## 2014-01-02 DIAGNOSIS — K5909 Other constipation: Secondary | ICD-10-CM

## 2014-01-02 DIAGNOSIS — I252 Old myocardial infarction: Secondary | ICD-10-CM

## 2014-01-02 DIAGNOSIS — I119 Hypertensive heart disease without heart failure: Secondary | ICD-10-CM | POA: Diagnosis not present

## 2014-01-02 DIAGNOSIS — D62 Acute posthemorrhagic anemia: Secondary | ICD-10-CM

## 2014-01-02 DIAGNOSIS — J40 Bronchitis, not specified as acute or chronic: Secondary | ICD-10-CM

## 2014-01-02 DIAGNOSIS — K219 Gastro-esophageal reflux disease without esophagitis: Secondary | ICD-10-CM | POA: Diagnosis present

## 2014-01-02 DIAGNOSIS — R6510 Systemic inflammatory response syndrome (SIRS) of non-infectious origin without acute organ dysfunction: Secondary | ICD-10-CM

## 2014-01-02 DIAGNOSIS — R0600 Dyspnea, unspecified: Secondary | ICD-10-CM

## 2014-01-02 DIAGNOSIS — I517 Cardiomegaly: Secondary | ICD-10-CM | POA: Diagnosis not present

## 2014-01-02 DIAGNOSIS — R441 Visual hallucinations: Secondary | ICD-10-CM

## 2014-01-02 DIAGNOSIS — E876 Hypokalemia: Secondary | ICD-10-CM

## 2014-01-02 DIAGNOSIS — J9610 Chronic respiratory failure, unspecified whether with hypoxia or hypercapnia: Secondary | ICD-10-CM | POA: Diagnosis present

## 2014-01-02 DIAGNOSIS — J309 Allergic rhinitis, unspecified: Secondary | ICD-10-CM

## 2014-01-02 DIAGNOSIS — E871 Hypo-osmolality and hyponatremia: Secondary | ICD-10-CM

## 2014-01-02 DIAGNOSIS — K921 Melena: Secondary | ICD-10-CM

## 2014-01-02 DIAGNOSIS — K2990 Gastroduodenitis, unspecified, without bleeding: Secondary | ICD-10-CM

## 2014-01-02 DIAGNOSIS — K222 Esophageal obstruction: Secondary | ICD-10-CM

## 2014-01-02 DIAGNOSIS — E162 Hypoglycemia, unspecified: Secondary | ICD-10-CM | POA: Diagnosis present

## 2014-01-02 DIAGNOSIS — K589 Irritable bowel syndrome without diarrhea: Secondary | ICD-10-CM

## 2014-01-02 DIAGNOSIS — G4730 Sleep apnea, unspecified: Secondary | ICD-10-CM

## 2014-01-02 DIAGNOSIS — J45909 Unspecified asthma, uncomplicated: Secondary | ICD-10-CM | POA: Diagnosis present

## 2014-01-02 DIAGNOSIS — R000 Tachycardia, unspecified: Secondary | ICD-10-CM

## 2014-01-02 DIAGNOSIS — L0231 Cutaneous abscess of buttock: Secondary | ICD-10-CM

## 2014-01-02 DIAGNOSIS — K529 Noninfective gastroenteritis and colitis, unspecified: Secondary | ICD-10-CM

## 2014-01-02 DIAGNOSIS — F22 Delusional disorders: Secondary | ICD-10-CM | POA: Diagnosis present

## 2014-01-02 DIAGNOSIS — E1169 Type 2 diabetes mellitus with other specified complication: Principal | ICD-10-CM | POA: Diagnosis present

## 2014-01-02 DIAGNOSIS — K648 Other hemorrhoids: Secondary | ICD-10-CM

## 2014-01-02 DIAGNOSIS — K2940 Chronic atrophic gastritis without bleeding: Secondary | ICD-10-CM

## 2014-01-02 DIAGNOSIS — C44300 Unspecified malignant neoplasm of skin of unspecified part of face: Secondary | ICD-10-CM

## 2014-01-02 DIAGNOSIS — I499 Cardiac arrhythmia, unspecified: Secondary | ICD-10-CM

## 2014-01-02 DIAGNOSIS — F329 Major depressive disorder, single episode, unspecified: Secondary | ICD-10-CM | POA: Diagnosis present

## 2014-01-02 DIAGNOSIS — R51 Headache: Secondary | ICD-10-CM

## 2014-01-02 HISTORY — DX: Family history of other specified conditions: Z84.89

## 2014-01-02 LAB — TROPONIN I

## 2014-01-02 LAB — URINALYSIS, ROUTINE W REFLEX MICROSCOPIC
BILIRUBIN URINE: NEGATIVE
Glucose, UA: NEGATIVE mg/dL
Hgb urine dipstick: NEGATIVE
KETONES UR: NEGATIVE mg/dL
Nitrite: NEGATIVE
Protein, ur: NEGATIVE mg/dL
Specific Gravity, Urine: 1.008 (ref 1.005–1.030)
UROBILINOGEN UA: 0.2 mg/dL (ref 0.0–1.0)
pH: 7 (ref 5.0–8.0)

## 2014-01-02 LAB — CBC WITH DIFFERENTIAL/PLATELET
BASOS ABS: 0 10*3/uL (ref 0.0–0.1)
Basophils Relative: 0 % (ref 0–1)
EOS ABS: 0 10*3/uL (ref 0.0–0.7)
Eosinophils Relative: 0 % (ref 0–5)
HCT: 26.9 % — ABNORMAL LOW (ref 36.0–46.0)
Hemoglobin: 8.5 g/dL — ABNORMAL LOW (ref 12.0–15.0)
Lymphocytes Relative: 7 % — ABNORMAL LOW (ref 12–46)
Lymphs Abs: 0.5 10*3/uL — ABNORMAL LOW (ref 0.7–4.0)
MCH: 27.9 pg (ref 26.0–34.0)
MCHC: 31.6 g/dL (ref 30.0–36.0)
MCV: 88.2 fL (ref 78.0–100.0)
Monocytes Absolute: 1.1 10*3/uL — ABNORMAL HIGH (ref 0.1–1.0)
Monocytes Relative: 15 % — ABNORMAL HIGH (ref 3–12)
Neutro Abs: 5.5 10*3/uL (ref 1.7–7.7)
Neutrophils Relative %: 78 % — ABNORMAL HIGH (ref 43–77)
PLATELETS: 244 10*3/uL (ref 150–400)
RBC: 3.05 MIL/uL — ABNORMAL LOW (ref 3.87–5.11)
RDW: 14.4 % (ref 11.5–15.5)
WBC: 7.2 10*3/uL (ref 4.0–10.5)

## 2014-01-02 LAB — BASIC METABOLIC PANEL
ANION GAP: 10 (ref 5–15)
BUN: 15 mg/dL (ref 6–23)
CALCIUM: 8.7 mg/dL (ref 8.4–10.5)
CO2: 31 mEq/L (ref 19–32)
Chloride: 99 mEq/L (ref 96–112)
Creatinine, Ser: 1.09 mg/dL (ref 0.50–1.10)
GFR, EST AFRICAN AMERICAN: 57 mL/min — AB (ref >90)
GFR, EST NON AFRICAN AMERICAN: 49 mL/min — AB (ref >90)
Glucose, Bld: 229 mg/dL — ABNORMAL HIGH (ref 70–99)
Potassium: 4.6 mEq/L (ref 3.7–5.3)
SODIUM: 140 meq/L (ref 137–147)

## 2014-01-02 LAB — URINE MICROSCOPIC-ADD ON

## 2014-01-02 LAB — CBG MONITORING, ED
GLUCOSE-CAPILLARY: 70 mg/dL (ref 70–99)
Glucose-Capillary: 140 mg/dL — ABNORMAL HIGH (ref 70–99)
Glucose-Capillary: 341 mg/dL — ABNORMAL HIGH (ref 70–99)
Glucose-Capillary: 232 mg/dL — ABNORMAL HIGH (ref 70–99)
Glucose-Capillary: 200 mg/dL — ABNORMAL HIGH (ref 70–99)

## 2014-01-02 MED ORDER — INSULIN DETEMIR 100 UNIT/ML ~~LOC~~ SOLN
14.0000 [IU] | Freq: Every day | SUBCUTANEOUS | Status: DC
  Administered 2014-01-02 – 2014-01-03 (×2): 14 [IU] via SUBCUTANEOUS
  Filled 2014-01-02 (×4): qty 0.14

## 2014-01-02 MED ORDER — INSULIN ASPART 100 UNIT/ML ~~LOC~~ SOLN
0.0000 [IU] | Freq: Three times a day (TID) | SUBCUTANEOUS | Status: DC
Start: 2014-01-02 — End: 2014-01-04
  Administered 2014-01-02: 3 [IU] via SUBCUTANEOUS
  Administered 2014-01-02: 7 [IU] via SUBCUTANEOUS
  Administered 2014-01-03: 5 [IU] via SUBCUTANEOUS
  Administered 2014-01-03: 7 [IU] via SUBCUTANEOUS
  Administered 2014-01-04: 3 [IU] via SUBCUTANEOUS
  Filled 2014-01-02 (×5): qty 1

## 2014-01-02 MED ORDER — INSULIN ASPART 100 UNIT/ML ~~LOC~~ SOLN: 3.0000 [IU] | Freq: Three times a day (TID) | SUBCUTANEOUS | Status: DC

## 2014-01-02 MED ORDER — SERTRALINE HCL 50 MG PO TABS
50.0000 mg | ORAL_TABLET | Freq: Every day | ORAL | Status: DC
  Administered 2014-01-02 – 2014-01-06 (×5): 50 mg via ORAL
  Filled 2014-01-02 (×5): qty 1

## 2014-01-02 MED ORDER — PREDNISONE 10 MG PO TABS
10.0000 mg | ORAL_TABLET | Freq: Every day | ORAL | Status: DC
  Administered 2014-01-02 – 2014-01-12 (×11): 10 mg via ORAL
  Filled 2014-01-02 (×12): qty 1

## 2014-01-02 MED ORDER — PANTOPRAZOLE SODIUM 40 MG PO TBEC
40.0000 mg | DELAYED_RELEASE_TABLET | Freq: Every day | ORAL | Status: DC
  Administered 2014-01-02 – 2014-01-12 (×11): 40 mg via ORAL
  Filled 2014-01-02 (×11): qty 1

## 2014-01-02 MED ORDER — RISPERIDONE 0.5 MG PO TABS
0.5000 mg | ORAL_TABLET | ORAL | Status: DC
  Administered 2014-01-03 – 2014-01-06 (×4): 0.5 mg via ORAL
  Filled 2014-01-02 (×6): qty 1

## 2014-01-02 MED ORDER — DIVALPROEX SODIUM 125 MG PO DR TAB
125.0000 mg | DELAYED_RELEASE_TABLET | Freq: Two times a day (BID) | ORAL | Status: DC
  Administered 2014-01-02 – 2014-01-06 (×9): 125 mg via ORAL
  Filled 2014-01-02 (×13): qty 1

## 2014-01-02 MED ORDER — LISINOPRIL 20 MG PO TABS
20.0000 mg | ORAL_TABLET | Freq: Every day | ORAL | Status: DC
  Administered 2014-01-02 – 2014-01-05 (×4): 20 mg via ORAL
  Filled 2014-01-02 (×5): qty 1

## 2014-01-02 MED ORDER — TRAMADOL HCL 50 MG PO TABS
50.0000 mg | ORAL_TABLET | Freq: Two times a day (BID) | ORAL | Status: DC
  Administered 2014-01-02 – 2014-01-06 (×9): 50 mg via ORAL
  Filled 2014-01-02 (×9): qty 1

## 2014-01-02 MED ORDER — RISPERIDONE 0.5 MG PO TABS
0.5000 mg | ORAL_TABLET | Freq: Two times a day (BID) | ORAL | Status: DC
  Administered 2014-01-02: 0.5 mg via ORAL
  Filled 2014-01-02: qty 1

## 2014-01-02 MED ORDER — ONDANSETRON HCL 4 MG PO TABS
4.0000 mg | ORAL_TABLET | Freq: Three times a day (TID) | ORAL | Status: DC | PRN
  Administered 2014-01-02 – 2014-01-10 (×3): 4 mg via ORAL
  Filled 2014-01-02 (×3): qty 1

## 2014-01-02 MED ORDER — LORAZEPAM 0.5 MG PO TABS
0.5000 mg | ORAL_TABLET | Freq: Every day | ORAL | Status: DC
  Administered 2014-01-02 – 2014-01-11 (×10): 0.5 mg via ORAL
  Filled 2014-01-02 (×10): qty 1

## 2014-01-02 MED ORDER — METOPROLOL SUCCINATE ER 100 MG PO TB24
100.0000 mg | ORAL_TABLET | Freq: Every day | ORAL | Status: DC
Start: 2014-01-02 — End: 2014-01-12
  Administered 2014-01-02 – 2014-01-12 (×11): 100 mg via ORAL
  Filled 2014-01-02 (×12): qty 1

## 2014-01-02 MED ORDER — RISPERIDONE 1 MG PO TABS
1.0000 mg | ORAL_TABLET | Freq: Every day | ORAL | Status: DC
  Administered 2014-01-02 – 2014-01-05 (×4): 1 mg via ORAL
  Filled 2014-01-02: qty 2
  Filled 2014-01-02 (×2): qty 1
  Filled 2014-01-02: qty 2
  Filled 2014-01-02: qty 1

## 2014-01-02 MED ORDER — ACETAMINOPHEN 325 MG PO TABS
650.0000 mg | ORAL_TABLET | ORAL | Status: DC | PRN
  Administered 2014-01-03 – 2014-01-09 (×3): 650 mg via ORAL
  Filled 2014-01-02 (×4): qty 2

## 2014-01-02 MED ORDER — NICOTINE 21 MG/24HR TD PT24: 21.0000 mg | MEDICATED_PATCH | Freq: Every day | TRANSDERMAL | Status: DC

## 2014-01-02 NOTE — ED Notes (Addendum)
Pt. In bed. Ask this RN and her sitter Onalee Hua if we were going to look after her tonight and make sure nothing bad happened to her. We told her we would be with her tonight and watch her and keep her safe as possible. She stated her husband always tells her that too but she doesn't always believe him. Pt. Also states that she is fearful. I ask her what was causing her to be afraid. She mentioned all the feet traveling in the hallway. She pointed towards the linen cart and said that is new there. Reassured her that she was fine. She said that didn't sound to good. I again stated we were here for her if she needed anything. She told me she didn't know if she would be able to sleep at all tonight but would at least try.

## 2014-01-02 NOTE — ED Notes (Signed)
Spouse - Aidaly Cordner [phone]

## 2014-01-02 NOTE — ED Notes (Signed)
Pt family visiting at bedside.

## 2014-01-02 NOTE — BH Assessment (Signed)
[facility] Assessment Progress Note  Spoke with Dr. Nicholas Lose and took history of pt.   MD says that husband brought pt in due to hallucinations that began 1 month ago (audio), and then pt began having visual hallucinations/paranoia last night, so pt's husband called EMS.  Since husband would like pt to be seen by psychiatrist for possible medication recommendations, MD requests consult be changed to telepsych.  Reginold Agent, NP will see pt when she comes in around 9 am.

## 2014-01-02 NOTE — ED Notes (Signed)
Pt. Called this RN to the room. Stated that she wanted to go home. When asked her why? She said no one wanted her here. She wanted me to call her husband. She said no one would touch her. Reassured her that I and the staff did want her her and I reached out and held her hand. She said thank you.

## 2014-01-02 NOTE — ED Notes (Signed)
Patient presents to ED via [facility]. Patient is from home where her husband called EMS due to patient having increased "hallucinations" and stating "I am afraid of my husband." Per husband who is at bedside- this occurs occasionally where patient has episodes of hallucinations. Upon assessment patient is A&Ox4. No complaints and states, "I know that's my husband and he is not going to hurt me." EMS administered 5 mg Haldol in route. VSS.

## 2014-01-02 NOTE — ED Notes (Signed)
Called again to the room. Pt. Said she wanted me to call her husband because she needed to go home. No one wanted her to be here. 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 I again touched her hand and told her we wanted her here. She reminded me to wash my hands before I left the room.

## 2014-01-02 NOTE — Consult Note (Addendum)
[facility] Face-to-Face Psychiatry Consult   Reason for Consult:  Psychosis Referring Physician:  ED Physician Marisa [name] is an 73 y.o. female. Total Time spent with patient: 45 minutes  Assessment: AXIS I:  Psychosis NOS AXIS II:  Deferred AXIS III:   Past Medical History  Diagnosis Date  . Diabetic ketoacidosis 11/2010  . Myocardial infarction     Chest pain s/p normal cath in 08/2004 then NSTEMI during 11/2010 admission, negative Myoview  . Diabetes mellitus type 2, insulin dependent     initial diagnoses 11/2008  . GERD (gastroesophageal reflux disease)   . Esophageal stricture 06/2004    Dilation 06/2004  . Hypertension   . Supraventricular tachycardia   . history of  pericarditis 12/2002  . Pericardial effusion   . Rheumatoid arthritis(714.0)     On MTX and chronic steroids  . Osteoarthritis   . Asthma     Home 3L O2   . Seizure 11/2008  . Rectal bleeding 11/2010    Large hemorrhoids  . Chronic anemia   . History of blood transfusion   . Diverticulosis   . Anemia    AXIS IV:  Medical illnesses, to include being on O2 on an ongoing basis, and Arthritis AXIS V:  31-40 impairment in reality testing  Plan:  Recommend psychiatric Inpatient admission when medically cleared.  Subjective:   Marisa [name] is a 73 y.o. female patient admitted with psychotic symptoms.  HPI:  Patient is a 73 year old married woman, who was brought in by husband today  Due to hallucinations. Patient states she has seen and heard three people in her house, and that when she asked her husband about this, he stated he did not see anything. Patient states she also hears these people moving about. She suspects one of these persons is her "husband's girlfriend, and a child ", and acknowledges she is suspicious that husband may be being unfaithful. As discussed with Nursing Staff patient has presented with some psychosis in ED, stating she had heard her daughter and felt that staff was keeping family member  from going to her room. As per chart notes, it appears psychotic symptoms may  have been present for a few weeks, but may be worsening. Patient 's insight is poor, and states she feels husband is lying to her when he says he does not see anything. Patient states she has been depressed about this , but denies severe neuro-vegetative symptoms and also denies any suicidal ideations, or any homicidal or violent ideations. She specifically denies any thoughts of hurting her husband. Psychiatric History- patient denies any formal psychiatric history- no history of prior psychosis, no history of severe depression, no history of mania, denies history of violence, describes occasional panic attacks. Denies alcohol or drug abuse. Medical History -as above- I note in particular, that patient is on steroids for RA, although she states she has been on the same dose for some time, and denies any history of steroid induced psychosis or depression. 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 ROS- some headaches, denies visual difficulties except for " floaters", denies chest pain at this time, (+) SOB with even mild exertions, no abdominal pain, (+) history of rectal bleeding, describes polyuria, describes easy bruising.  Social History- retired, lives with husband of 12 + years, two adult children Family History- denies mental illness in family HPI Elements:   Relatively new onset psychosis,  chronic medical illnesses.   Past Psychiatric History: Past Medical History  Diagnosis Date  . Diabetic ketoacidosis 11/2010  . Myocardial infarction     Chest pain s/p normal cath in 08/2004 then NSTEMI during 11/2010 admission, negative Myoview  . Diabetes mellitus type 2, insulin dependent     initial diagnoses 11/2008  . GERD (gastroesophageal reflux disease)   . Esophageal stricture 06/2004    Dilation 06/2004  . Hypertension   .  Supraventricular tachycardia   . history of  pericarditis 12/2002  . Pericardial effusion   . Rheumatoid arthritis(714.0)     On MTX and chronic steroids  . Osteoarthritis   . Asthma     Home 3L O2   . Seizure 11/2008  . Rectal bleeding 11/2010    Large hemorrhoids  . Chronic anemia   . History of blood transfusion   . Diverticulosis   . Anemia     reports that she has never smoked. She has never used smokeless tobacco. She reports that she does not drink alcohol or use illicit drugs. Family History  Problem Relation Age of Onset  . Heart disease Mother   . Stroke Father            Allergies:   Allergies  Allergen Reactions  . Demerol [Meperidine] Other (See Comments)    Hallucinations  . Penicillins Swelling and Other (See Comments)    Throat swelling     Objective: Blood pressure 180/80, pulse 112, temperature 97.9 F (36.6 C), temperature source Oral, resp. rate 18, SpO2 100.00%.There is no weight on file to calculate BMI. Results for orders placed during the hospital encounter of 01/02/14 (from the past 72 hour(s))  CBC WITH DIFFERENTIAL     Status: Abnormal   Collection Time    01/02/14  1:57 AM      Result Value Ref Range   WBC 7.2  4.0 - 10.5 K/uL   RBC 3.05 (*) 3.87 - 5.11 MIL/uL   Hemoglobin 8.5 (*) 12.0 - 15.0 g/dL   HCT 26.9 (*) 36.0 - 46.0 %   MCV 88.2  78.0 - 100.0 fL   MCH 27.9  26.0 - 34.0 pg   MCHC 31.6  30.0 - 36.0 g/dL   RDW 14.4  11.5 - 15.5 %   Platelets 244  150 - 400 K/uL   Neutrophils Relative % 78 (*) 43 - 77 %   Neutro Abs 5.5  1.7 - 7.7 K/uL   Lymphocytes Relative 7 (*) 12 - 46 %   Lymphs Abs 0.5 (*) 0.7 - 4.0 K/uL   Monocytes Relative 15 (*) 3 - 12 %   Monocytes Absolute 1.1 (*) 0.1 - 1.0 K/uL   Eosinophils Relative 0  0 - 5 %   Eosinophils Absolute 0.0  0.0 - 0.7 K/uL   Basophils Relative 0  0 - 1 %   Basophils Absolute 0.0  0.0 - 0.1 K/uL  BASIC METABOLIC PANEL     Status: Abnormal   Collection Time    01/02/14  1:57 AM       Result Value Ref Range   Sodium 140  137 - 147 mEq/L   Potassium 4.6  3.7 - 5.3 mEq/L   Chloride 99  96 - 112 mEq/L   CO2 31  19 - 32 mEq/L   Glucose, Bld 229 (*) 70 - 99 mg/dL   BUN 15  6 - 23 mg/dL   Creatinine, Ser 1.09  0.50 - 1.10 mg/dL  Calcium 8.7  8.4 - 10.5 mg/dL   GFR calc non Af Amer 49 (*) >90 mL/min   GFR calc Af Amer 57 (*) >90 mL/min   Comment: (NOTE)     The eGFR has been calculated using the CKD EPI equation.     This calculation has not been validated in all clinical situations.     eGFR's persistently <90 mL/min signify possible Chronic Kidney     Disease.   Anion gap 10  5 - 15  URINALYSIS, ROUTINE W REFLEX MICROSCOPIC     Status: Abnormal   Collection Time    01/02/14  2:24 AM      Result Value Ref Range   Color, Urine YELLOW  YELLOW   APPearance CLOUDY (*) CLEAR   Specific Gravity, Urine 1.008  1.005 - 1.030   pH 7.0  5.0 - 8.0   Glucose, UA NEGATIVE  NEGATIVE mg/dL   Hgb urine dipstick NEGATIVE  NEGATIVE   Bilirubin Urine NEGATIVE  NEGATIVE   Ketones, ur NEGATIVE  NEGATIVE mg/dL   Protein, ur NEGATIVE  NEGATIVE mg/dL   Urobilinogen, UA 0.2  0.0 - 1.0 mg/dL   Nitrite NEGATIVE  NEGATIVE   Leukocytes, UA TRACE (*) NEGATIVE  URINE MICROSCOPIC-ADD ON     Status: Abnormal   Collection Time    01/02/14  2:24 AM      Result Value Ref Range   Squamous Epithelial / LPF MANY (*) RARE   WBC, UA 3-6  <3 WBC/hpf   Bacteria, UA RARE  RARE  CBG MONITORING, ED     Status: Abnormal   Collection Time    01/02/14  5:26 AM      Result Value Ref Range   Glucose-Capillary 140 (*) 70 - 99 mg/dL  TROPONIN I     Status: None   Collection Time    01/02/14  8:20 AM      Result Value Ref Range   Troponin I <0.30  <0.30 ng/mL   Comment:            Due to the release kinetics of cTnI,     a negative result within the first hours     of the onset of symptoms does not rule out     myocardial infarction with certainty.     If myocardial infarction is still suspected,      repeat the test at appropriate intervals.  CBG MONITORING, ED     Status: Abnormal   Collection Time    01/02/14  9:16 AM      Result Value Ref Range   Glucose-Capillary 341 (*) 70 - 99 mg/dL  CBG MONITORING, ED     Status: Abnormal   Collection Time    01/02/14 12:23 PM      Result Value Ref Range   Glucose-Capillary 232 (*) 70 - 99 mg/dL   Labs are reviewed and are pertinent for  Anemia, hyperglycemia Head MRI- 1. Stable age advanced white matter disease. This likely reflects  the sequela of chronic microvascular ischemia.  2. No acute intracranial abnormality or significant interval change.   Current Facility-Administered Medications  Medication Dose Route Frequency Provider Last Rate Last Dose  . acetaminophen (TYLENOL) tablet 650 mg  650 mg Oral Q4H PRN Ankit Nanavati, MD      . divalproex (DEPAKOTE) DR tablet 125 mg  125 mg Oral Q12H Mirna Mires, MD   125 mg at 01/02/14 1526  . insulin aspart (novoLOG) injection 0-9 Units  0-9 Units Subcutaneous TID WC Varney Biles, MD   3 Units at 01/02/14 1301  . insulin detemir (LEVEMIR) injection 14 Units  14 Units Subcutaneous Q1200 Varney Biles, MD   14 Units at 01/02/14 1300  . lisinopril (PRINIVIL,ZESTRIL) tablet 20 mg  20 mg Oral Daily Ankit Nanavati, MD   20 mg at 01/02/14 1047  . LORazepam (ATIVAN) tablet 0.5 mg  0.5 mg Oral QHS Ankit Nanavati, MD      . metoprolol succinate (TOPROL-XL) 24 hr tablet 100 mg  100 mg Oral Daily Ankit Nanavati, MD   100 mg at 01/02/14 1220  . nicotine (NICODERM CQ - dosed in mg/24 hours) patch 21 mg  21 mg Transdermal Daily Ankit Nanavati, MD      . ondansetron (ZOFRAN) tablet 4 mg  4 mg Oral Q8H PRN Ankit Nanavati, MD      . pantoprazole (PROTONIX) EC tablet 40 mg  40 mg Oral Daily Ankit Nanavati, MD   40 mg at 01/02/14 1052  . predniSONE (DELTASONE) tablet 10 mg  10 mg Oral Daily Varney Biles, MD   10 mg at 01/02/14 1153  . risperiDONE (RISPERDAL) tablet 0.5 mg  0.5 mg Oral BID Varney Biles,  MD   0.5 mg at 01/02/14 1047  . sertraline (ZOLOFT) tablet 50 mg  50 mg Oral Daily Varney Biles, MD   50 mg at 01/02/14 1047  . traMADol (ULTRAM) tablet 50 mg  50 mg Oral Q12H Varney Biles, MD   50 mg at 01/02/14 1153   Current Outpatient Prescriptions  Medication Sig Dispense Refill  . acetaminophen (TYLENOL) 500 MG tablet Take 500 mg by mouth every 6 (six) hours as needed.      Marland Kitchen albuterol (PROVENTIL) (5 MG/ML) 0.5% nebulizer solution Take 2.5 mg by nebulization every 6 (six) hours as needed for wheezing or shortness of breath.      . clotrimazole-betamethasone (LOTRISONE) cream Apply 1 application topically 2 (two) times daily.       . Coenzyme Q10 (CO Q 10) 100 MG CAPS Take 100 mg by mouth daily.      Marland Kitchen docusate sodium (COLACE) 100 MG capsule Take 100 mg by mouth daily.       Marland Kitchen esomeprazole (NEXIUM) 20 MG capsule Take 20 mg by mouth 2 (two) times daily.      Marland Kitchen estrogens, conjugated, (PREMARIN) 0.625 MG tablet Take 0.625 mg by mouth daily.       . hydrocortisone (ANUSOL-HC) 25 MG suppository Insert one suppository daily as needed for hemorrhoids.  30 suppository  1  . insulin aspart (NOVOLOG FLEXPEN) 100 UNIT/ML injection Inject 3-12 Units into the skin 3 (three) times daily before meals. Sliding scale      . insulin detemir (LEVEMIR FLEXPEN) 100 UNIT/ML injection Inject 14 Units into the skin daily at 12 noon.       . levalbuterol (XOPENEX HFA) 45 MCG/ACT inhaler Inhale 1-2 puffs into the lungs every 4 (four) hours as needed for wheezing.      Marland Kitchen lisinopril (PRINIVIL,ZESTRIL) 20 MG tablet Take 20 mg by mouth daily.      Marland Kitchen LORazepam (ATIVAN) 0.5 MG tablet Take 0.5 mg by mouth at bedtime.       . metoprolol (TOPROL XL) 100 MG 24 hr tablet Take 100 mg by mouth daily.       . Multiple Vitamin (MULTIVITAMINS PO) Take 1 tablet by mouth. Daily.       . naproxen sodium (ANAPROX) 220 MG tablet Take 220  mg by mouth 2 (two) times daily with a meal.      . potassium chloride SA (K-DUR,KLOR-CON) 20  MEQ tablet Take 20 mEq by mouth 3 (three) times a week.       . predniSONE (DELTASONE) 10 MG tablet Take 10 mg by mouth daily.      . pregabalin (LYRICA) 75 MG capsule Take 75 mg by mouth daily.       . promethazine (PHENERGAN) 12.5 MG tablet Take 12.5 mg by mouth every 6 (six) hours as needed for nausea.      . risperiDONE (RISPERDAL) 0.5 MG tablet Take 0.5 mg by mouth 2 (two) times daily.      . sertraline (ZOLOFT) 50 MG tablet Take 50 mg by mouth daily.       . traMADol (ULTRAM) 50 MG tablet Take 50 mg by mouth every 12 (twelve) hours.       . Vitamin D-Vitamin K (VITAMIN K2-VITAMIN D3 PO) Take 1 tablet by mouth daily.      . AMBULATORY NON FORMULARY MEDICATION Continuous O2 @@ 2.5-3 LMP        Psychiatric Specialty Exam:     Blood pressure 180/80, pulse 112, temperature 97.9 F (36.6 C), temperature source Oral, resp. rate 18, SpO2 100.00%.There is no weight on file to calculate BMI.  General Appearance: Fairly Groomed  Engineer, water::  Good  Speech:  Normal Rate  Volume:  Normal  Mood:  states she feels depressed, but affect reactive, and smiles often and appropriately  Affect:  Appropriate  Thought Process:  Goal Directed and Linear  Orientation:  Full (Time, Place, and Person)- patient is fully oriented x 3   Thought Content:  Delusions and Hallucinations: Auditory Visual  Suicidal Thoughts:  No- denies any suicidal or homicidal ideations  Homicidal Thoughts:  No  Memory:  recednt and remote grossly intact, recall 3/3 imm and 3/3 at 3 minutes   Judgement:  Impaired  Insight:  Lacking  Psychomotor Activity:  Normal  Concentration:  Good  Recall:  Good  Fund of Knowledge:Good  Language: Good  Akathisia:  No  Handed:  Right  AIMS (if indicated):     Assets:  Desire for Improvement Resilience  Sleep:      Musculoskeletal: Strength & Muscle Tone: describes joint pain, and has hand/wrist deformity related to chronic arthritis Gait & Station: gait not examined Patient  leans: N/A  Treatment Plan Summary: 1. At this time patient is presenting with psychosis, minimal insight, with no known prior psychiatric history , and with no apparent associated delirium or obvious cognitive decline. She does  warrant psychiatric management and treatment. Nursing report  is that she is awaiting psychiatric bed availability . 2. Would consider increasing Risperidone to 0.5 mgrs QAM and 1 mgr QHS at this time, and would titrate slowly as tolerated and necessary.  Continue Zoloft 50 mgrs QDAY.  3. Would consider possibility of Steroid Induced Psychosis, although less likely in the context of chronic steroid management and emergence of symptoms recently in spite of no recent increase in dose. 4. Based on history of gastric atrophy/iron malabsorption concerns, would consider obtaining B12, Folate, Vitamin D3, all of which can contribute to psychiatric illnesses if chronically deficient.     Yulanda Diggs, [place] 01/02/2014 5:03 PM

## 2014-01-02 NOTE — BH Assessment (Signed)
Pt's referral was faxed to the following facilities:  [facility]  -[facility], [place]  Disposition MHT

## 2014-01-02 NOTE — Progress Notes (Signed)
MHT spoke with Thayer Headings, RN at [facility] who was needing at height and weight on the pt.  Per attending at [facility], declined due to [place] Artrice Kraker, MHT/NS

## 2014-01-02 NOTE — ED Notes (Signed)
Marisa [name] called to confirm they are seeking placement at a geriatric psychiatric facility.

## 2014-01-02 NOTE — ED Notes (Addendum)
Husband called, stated pt called him and said she was ready to go home. Explained that pt was not being d/c'd and that she was waiting to be seen by a psych provider.

## 2014-01-02 NOTE — ED Provider Notes (Signed)
CSN: [id number]     Arrival date & time 01/02/14  0032 History   First MD Initiated Contact with Patient 01/02/14 0126     Chief Complaint  Patient presents with  . Hallucinations     (Consider location/radiation/quality/duration/timing/severity/associated sxs/prior Treatment) HPI Comments: Pt comes in with cc of hallucinations. Per husband, pt has been having auditory hallucinations for the past month. PCP saw the patient, started her on Risperdal and also sent her for a MRI - which was negative. Per husband, the last 4 days pt has been having visual hallucinations, and last night, she hot acutely worse. EMS was called. Pt was telling EMS that she is afraid of her husband. Pt admits to both visual and auditory hallucinations.  The history is provided by the patient.    Past Medical History  Diagnosis Date  . Diabetic ketoacidosis 11/2010  . Myocardial infarction     Chest pain s/p normal cath in 08/2004 then NSTEMI during 11/2010 admission, negative Myoview  . Diabetes mellitus type 2, insulin dependent     initial diagnoses 11/2008  . GERD (gastroesophageal reflux disease)   . Esophageal stricture 06/2004    Dilation 06/2004  . Hypertension   . Supraventricular tachycardia   . history of  pericarditis 12/2002  . Pericardial effusion   . Rheumatoid arthritis(714.0)     On MTX and chronic steroids  . Osteoarthritis   . Asthma     Home 3L O2   . Seizure 11/2008  . Rectal bleeding 11/2010    Large hemorrhoids  . Chronic anemia   . History of blood transfusion   . Diverticulosis   . Anemia    Past Surgical History  Procedure Laterality Date  . Cholecystectomy    . Abdominal hysterectomy    . Incise and drain abcess  09/2011    I&D of peri-rectal abcess per Dr Zella Richer.   . Colonoscopy N/A 01/06/2013    Procedure: COLONOSCOPY;  Surgeon: Ladene Artist, MD;  Location: [facility] ENDOSCOPY;  Service: Endoscopy;  Laterality: N/A;  . Esophagogastroduodenoscopy N/A 02/11/2013    Procedure:  ESOPHAGOGASTRODUODENOSCOPY (EGD);  Surgeon: Irene Shipper, MD;  Location: [facility] ENDOSCOPY;  Service: Endoscopy;  Laterality: N/A;   Family History  Problem Relation Age of Onset  . Heart disease Mother   . Stroke Father    History  Substance Use Topics  . Smoking status: Never Smoker   . Smokeless tobacco: Never Used  . Alcohol Use: No   OB History   Grav Para Term Preterm Abortions TAB SAB Ect Mult Living                 Review of Systems  Constitutional: Positive for activity change. Negative for fever.  Respiratory: Negative for shortness of breath.   Cardiovascular: Negative for chest pain.  Gastrointestinal: Negative for nausea, vomiting and abdominal pain.  Genitourinary: Negative for dysuria.  Musculoskeletal: Negative for neck pain.  Neurological: Negative for headaches.  Psychiatric/Behavioral: Positive for hallucinations. Negative for suicidal ideas. The patient is not nervous/anxious and is not hyperactive.       Allergies  Demerol and Penicillins  Home Medications   Prior to Admission medications   Medication Sig Start Date End Date Taking? Authorizing Provider  acetaminophen (TYLENOL) 500 MG tablet Take 500 mg by mouth every 6 (six) hours as needed.   Yes Historical Provider, MD  albuterol (PROVENTIL) (5 MG/ML) 0.5% nebulizer solution Take 2.5 mg by nebulization every 6 (six) hours as needed for wheezing  or shortness of breath. 10/08/11  Yes Nishant Dhungel, MD  clotrimazole-betamethasone (LOTRISONE) cream Apply 1 application topically 2 (two) times daily.  06/01/13  Yes Historical Provider, MD  Coenzyme Q10 (CO Q 10) 100 MG CAPS Take 100 mg by mouth daily.   Yes Historical Provider, MD  docusate sodium (COLACE) 100 MG capsule Take 100 mg by mouth daily.    Yes Historical Provider, MD  esomeprazole (NEXIUM) 20 MG capsule Take 20 mg by mouth 2 (two) times daily.   Yes Historical Provider, MD  estrogens, conjugated, (PREMARIN) 0.625 MG tablet Take 0.625 mg by mouth  daily.    Yes Historical Provider, MD  hydrocortisone (ANUSOL-HC) 25 MG suppository Insert one suppository daily as needed for hemorrhoids. 08/26/13  Yes Lennis Marion Downer, MD  insulin aspart (NOVOLOG FLEXPEN) 100 UNIT/ML injection Inject 3-12 Units into the skin 3 (three) times daily before meals. Sliding scale   Yes Historical Provider, MD  insulin detemir (LEVEMIR FLEXPEN) 100 UNIT/ML injection Inject 14 Units into the skin daily at 12 noon.    Yes Historical Provider, MD  levalbuterol [facility] HFA) 45 MCG/ACT inhaler Inhale 1-2 puffs into the lungs every 4 (four) hours as needed for wheezing.   Yes Historical Provider, MD  lisinopril (PRINIVIL,ZESTRIL) 20 MG tablet Take 20 mg by mouth daily.   Yes Historical Provider, MD  LORazepam (ATIVAN) 0.5 MG tablet Take 0.5 mg by mouth at bedtime.    Yes Historical Provider, MD  metoprolol (TOPROL XL) 100 MG 24 hr tablet Take 100 mg by mouth daily.    Yes Historical Provider, MD  Multiple Vitamin (MULTIVITAMINS PO) Take 1 tablet by mouth. Daily.    Yes Historical Provider, MD  naproxen sodium (ANAPROX) 220 MG tablet Take 220 mg by mouth 2 (two) times daily with a meal.   Yes Historical Provider, MD  potassium chloride SA (K-DUR,KLOR-CON) 20 MEQ tablet Take 20 mEq by mouth 3 (three) times a week.    Yes Historical Provider, MD  predniSONE (DELTASONE) 10 MG tablet Take 10 mg by mouth daily.   Yes Historical Provider, MD  pregabalin (LYRICA) 75 MG capsule Take 75 mg by mouth daily.    Yes Historical Provider, MD  promethazine (PHENERGAN) 12.5 MG tablet Take 12.5 mg by mouth every 6 (six) hours as needed for nausea.   Yes Historical Provider, MD  risperiDONE (RISPERDAL) 0.5 MG tablet Take 0.5 mg by mouth 2 (two) times daily.   Yes Historical Provider, MD  sertraline (ZOLOFT) 50 MG tablet Take 50 mg by mouth daily.  05/30/13  Yes Historical Provider, MD  traMADol (ULTRAM) 50 MG tablet Take 50 mg by mouth every 12 (twelve) hours.  09/14/13  Yes Historical Provider, MD   Vitamin D-Vitamin K (VITAMIN K2-VITAMIN D3 PO) Take 1 tablet by mouth daily.   Yes Historical Provider, MD  AMBULATORY NON FORMULARY MEDICATION Continuous O2 @@ 2.5-3 LMP    Historical Provider, MD   BP 166/58  Pulse 94  Temp(Src) 98 F (36.7 C) (Oral)  Resp 13  SpO2 100% Physical Exam  Constitutional: She is oriented to person, place, and time. She appears well-developed.  HENT:  Head: Normocephalic and atraumatic.  Eyes: Conjunctivae and EOM are normal. Pupils are equal, round, and reactive to light.  Neck: Normal range of motion. Neck supple.  Cardiovascular: Normal rate, regular rhythm, normal heart sounds and intact distal pulses.   No murmur heard. Pulmonary/Chest: Effort normal. No respiratory distress. She has no wheezes.  Abdominal: Soft. Bowel sounds  are normal. She exhibits no distension. There is no tenderness. There is no rebound and no guarding.  Neurological: She is alert and oriented to person, place, and time.  Skin: Skin is warm and dry.  Psychiatric: She has a normal mood and affect. Her behavior is normal.    ED Course  Procedures (including critical care time) Labs Review Labs Reviewed  CBC WITH DIFFERENTIAL - Abnormal; Notable for the following:    RBC 3.05 (*)    Hemoglobin 8.5 (*)    HCT 26.9 (*)    Neutrophils Relative % 78 (*)    Lymphocytes Relative 7 (*)    Lymphs Abs 0.5 (*)    Monocytes Relative 15 (*)    Monocytes Absolute 1.1 (*)    All other components within normal limits  BASIC METABOLIC PANEL - Abnormal; Notable for the following:    Glucose, Bld 229 (*)    GFR calc non Af Amer 49 (*)    GFR calc Af Amer 57 (*)    All other components within normal limits  URINALYSIS, ROUTINE W REFLEX MICROSCOPIC - Abnormal; Notable for the following:    APPearance CLOUDY (*)    Leukocytes, UA TRACE (*)    All other components within normal limits  URINE MICROSCOPIC-ADD ON - Abnormal; Notable for the following:    Squamous Epithelial / LPF MANY (*)     All other components within normal limits  CBG MONITORING, ED - Abnormal; Notable for the following:    Glucose-Capillary 140 (*)    All other components within normal limits  TROPONIN I    Imaging Review Mr Kizzie Fantasia Contrast  12/31/2013   CLINICAL DATA:  Confusion.  Delirium.  EXAM: MRI HEAD WITHOUT AND WITH CONTRAST  TECHNIQUE: Multiplanar, multiecho pulse sequences of the brain and surrounding structures were obtained without and with intravenous contrast.  CONTRAST:  5 mL MultiHance.  COMPARISON:  CT head without contrast 10/21/2013. MRI brain without and with contrast 12/07/2008.  FINDINGS: No acute infarct, hemorrhage, or mass lesion is present. Moderate periventricular and scattered subcortical white matter changes bilaterally are similar to the prior study. The ventricles are of normal size. Flow is present in the major intracranial arteries. No significant extra-axial fluid collection is present. Globes and orbits are intact. Midline structures are normal.  The postcontrast images demonstrate no pathologic enhancement.  IMPRESSION: 1. Stable age advanced white matter disease. This likely reflects the sequela of chronic microvascular ischemia. 2. No acute intracranial abnormality or significant interval change.   Electronically Signed   By: Lawrence Santiago M.D.   On: 12/31/2013 16:00     EKG Interpretation None      MDM   Final diagnoses:  Hallucination, visual  Auditory hallucinations   Pt comes in with psychoses, and at least had a single instance of paranoia. New symptoms. Husband would appreciate psych evaluation, as pt not responding to risperdal.  No organic cause per hx and exam. Telepsycho ordered. No SI, no HI, husband feels comfortable taking pt home if psych decides to d/c her. NO IVC done.   Varney Biles, MD 01/02/14 [phone]

## 2014-01-03 DIAGNOSIS — F29 Unspecified psychosis not due to a substance or known physiological condition: Secondary | ICD-10-CM | POA: Diagnosis not present

## 2014-01-03 LAB — CBG MONITORING, ED
GLUCOSE-CAPILLARY: 45 mg/dL — AB (ref 70–99)
GLUCOSE-CAPILLARY: 46 mg/dL — AB (ref 70–99)
GLUCOSE-CAPILLARY: 261 mg/dL — AB (ref 70–99)
GLUCOSE-CAPILLARY: 159 mg/dL — AB (ref 70–99)
GLUCOSE-CAPILLARY: 293 mg/dL — AB (ref 70–99)
GLUCOSE-CAPILLARY: 275 mg/dL — AB (ref 70–99)
Glucose-Capillary: 165 mg/dL — ABNORMAL HIGH (ref 70–99)
Glucose-Capillary: 60 mg/dL — ABNORMAL LOW (ref 70–99)
Glucose-Capillary: 349 mg/dL — ABNORMAL HIGH (ref 70–99)
Glucose-Capillary: 164 mg/dL — ABNORMAL HIGH (ref 70–99)
Glucose-Capillary: 345 mg/dL — ABNORMAL HIGH (ref 70–99)

## 2014-01-03 LAB — FOLATE: Folate: >20 ng/mL

## 2014-01-03 LAB — VITAMIN B12: Vitamin B-12: 551 pg/mL (ref 211–911)

## 2014-01-03 MED ORDER — INSULIN ASPART 100 UNIT/ML ~~LOC~~ SOLN
5.0000 [IU] | Freq: Once | SUBCUTANEOUS | Status: AC
  Administered 2014-01-03: 5 [IU] via SUBCUTANEOUS
  Filled 2014-01-03: qty 1

## 2014-01-03 NOTE — ED Notes (Signed)
Patient is resting comfortably. 

## 2014-01-03 NOTE — Progress Notes (Addendum)
Gero-psych inptx recommended.  Initiated seeking placement by contacting the following facilities with gero-psych treatment programming:  [facility]- per [facility] having d/c's this afternoon and can fax referral so psychiatrist can review, referral faxed [facility]- no answer, referral re-faxed [facility]- per [facility] at capacity, "no beds on any level" [facility]- per [facility] no adult beds but do have some gero beds available, referral faxed to her directly for review [facility]- per [facility] can fax for review [facility]- some adult and gero beds available, referral faxed [facility]- per Jenny Reichmann can fax for review by psychiatrist, referral faxed [facility]- no answer, referral faxed  *pt has been declined at both [facility] d/t medical acuity *[facility] no longer has a geriatric psych unit  [facility] Disposition MHT

## 2014-01-03 NOTE — ED Notes (Signed)
States that "do you hear them running up and down the hall?"

## 2014-01-03 NOTE — ED Notes (Addendum)
Pt. Called out stated she needed her blood sugar checked. Blood sugar checked = 46. Pt. Given graham crackers and peanut butter and a cup of orange juice tolerated well. Will recheck BS in 10-15min.

## 2014-01-03 NOTE — ED Notes (Signed)
Pt. Encouraged to eat some more of her [place] sandwich.

## 2014-01-03 NOTE — ED Notes (Signed)
Nurse Tech on Ed Floor took vitals.

## 2014-01-03 NOTE — ED Notes (Addendum)
Pt. Blood sugar = 46. Pt. Eating more peanut butter. Ate 1/2 a popsickle and 1/3 of a [place] sandwich. Will recheck BS in another 10-15min.

## 2014-01-03 NOTE — ED Notes (Signed)
CBG 293 

## 2014-01-03 NOTE — ED Notes (Signed)
Pt requesting something for pain.  

## 2014-01-03 NOTE — Progress Notes (Signed)
Follow-up calls have been placed to the following facilities regarding status of pts referral:  [facility]- multiple calls placed, no answer [facility]- per [facility] currently at capacity but referral was received and given to psychiatrist for review, stated that if appropriate and bed becomes available will contact [facility]- per [facility] referral has been received but has not yet been reviewed [facility]- per [facility] referral has been received and is going to review now, will contact TTS once review is complete.  [facility]- per [facility] pt referral reviewed and do not feel that pt meets inpt criteria for their unit at this time, declined [facility]- per [facility] pt has been declined d/t med acuity  *pt has also already been declined at [facility] and [facility] Disposition MHT

## 2014-01-03 NOTE — ED Notes (Signed)
Per dr Ashok Cordia -- hold sliding scale for lunch today and dinner today-- give levimere 14 u now.

## 2014-01-03 NOTE — Consult Note (Signed)
Psychiatry Consult follow up  Reason for Consult:  Psychosis Referring Physician:  ED Physician Marisa [name] is an 73 y.o. female. Total Time spent with patient: 30 minutes  Assessment: AXIS I:  Psychosis NOS. Rule out steroid induced psychosis AXIS II:  Deferred AXIS III:   Past Medical History  Diagnosis Date  . Diabetic ketoacidosis 11/2010  . Myocardial infarction     Chest pain s/p normal cath in 08/2004 then NSTEMI during 11/2010 admission, negative Myoview  . Diabetes mellitus type 2, insulin dependent     initial diagnoses 11/2008  . GERD (gastroesophageal reflux disease)   . Esophageal stricture 06/2004    Dilation 06/2004  . Hypertension   . Supraventricular tachycardia   . history of  pericarditis 12/2002  . Pericardial effusion   . Rheumatoid arthritis(714.0)     On MTX and chronic steroids  . Osteoarthritis   . Asthma     Home 3L O2   . Seizure 11/2008  . Rectal bleeding 11/2010    Large hemorrhoids  . Chronic anemia   . History of blood transfusion   . Diverticulosis   . Anemia    AXIS IV:  Medical illnesses, to include being on O2 on an ongoing basis, and Arthritis AXIS V:  31-40 impairment in reality testing  Plan:  Recommend psychiatric Inpatient admission when medically cleared. Supportive therapy provided about ongoing stressors. continue risperidone for psychosis and zoloft for depression as Prescribed while psych inpatient placement d is pending  Subjective:   Marisa [name] is a 73 y.o. female patient admitted with psychotic symptoms.  Patient is seen today for for psychiatric consultation follow up in [facility] ER for visual and auditory hallucinations. Nursing Staff has reported that she has been acting bizarre, talking with people that were not there and responding to her internal stimuli. Patient appeared lying in her bed, some what anxious and worried about the hallucinations and paranoid thoughts. Patient has been compliant with risperidone and has  some what responded without adverse effects including extrapyramidal symptoms. Patient has depressed mood but denies severe neuro-vegetative symptoms. She denies suicidal ideations, or homicidal or violent ideations. Patient has no past psychiatric history including out patient or in patient treatment. She has denies alcohol or drug abuse.  Medical History: Patient is on steroids for RA, and states she has been on the same dose for some time, and denies history of steroid induced psychosis or depression. 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  ROS- some headaches, denies visual difficulties except for " floaters", denies chest pain at this time, (+) SOB with even mild exertions, no abdominal pain, (+) history of rectal bleeding, describes polyuria, describes easy bruising.   Social History- retired, lives with husband of 9 + years, two adult children Family History- denies mental illness in family  HPI Elements:   Relatively new onset psychosis, chronic medical illnesses.   Past Psychiatric History: Past Medical History  Diagnosis Date  . Diabetic ketoacidosis 11/2010  . Myocardial infarction     Chest pain s/p normal cath in 08/2004 then NSTEMI during 11/2010 admission, negative Myoview  . Diabetes mellitus type 2, insulin dependent     initial diagnoses 11/2008  . GERD (gastroesophageal reflux disease)   . Esophageal stricture 06/2004    Dilation 06/2004  . Hypertension   . Supraventricular tachycardia   . history of  pericarditis 12/2002  . Pericardial effusion   .  Rheumatoid arthritis(714.0)     On MTX and chronic steroids  . Osteoarthritis   . Asthma     Home 3L O2   . Seizure 11/2008  . Rectal bleeding 11/2010    Large hemorrhoids  . Chronic anemia   . History of blood transfusion   . Diverticulosis   . Anemia     reports that she has never smoked. She has never used smokeless  tobacco. She reports that she does not drink alcohol or use illicit drugs. Family History  Problem Relation Age of Onset  . Heart disease Mother   . Stroke Father            Allergies:   Allergies  Allergen Reactions  . Demerol [Meperidine] Other (See Comments)    Hallucinations  . Penicillins Swelling and Other (See Comments)    Throat swelling     Objective: Blood pressure 158/70, pulse 104, temperature 98.3 F (36.8 C), temperature source Oral, resp. rate 28, height $RemoveBe'4\' 11"'bvsHxgNKD$  (5.956 m), weight 49.896 kg (110 lb), SpO2 100.00%.Body mass index is 22.21 kg/(m^2). Results for orders placed during the hospital encounter of 01/02/14 (from the past 72 hour(s))  CBC WITH DIFFERENTIAL     Status: Abnormal   Collection Time    01/02/14  1:57 AM      Result Value Ref Range   WBC 7.2  4.0 - 10.5 K/uL   RBC 3.05 (*) 3.87 - 5.11 MIL/uL   Hemoglobin 8.5 (*) 12.0 - 15.0 g/dL   HCT 26.9 (*) 36.0 - 46.0 %   MCV 88.2  78.0 - 100.0 fL   MCH 27.9  26.0 - 34.0 pg   MCHC 31.6  30.0 - 36.0 g/dL   RDW 14.4  11.5 - 15.5 %   Platelets 244  150 - 400 K/uL   Neutrophils Relative % 78 (*) 43 - 77 %   Neutro Abs 5.5  1.7 - 7.7 K/uL   Lymphocytes Relative 7 (*) 12 - 46 %   Lymphs Abs 0.5 (*) 0.7 - 4.0 K/uL   Monocytes Relative 15 (*) 3 - 12 %   Monocytes Absolute 1.1 (*) 0.1 - 1.0 K/uL   Eosinophils Relative 0  0 - 5 %   Eosinophils Absolute 0.0  0.0 - 0.7 K/uL   Basophils Relative 0  0 - 1 %   Basophils Absolute 0.0  0.0 - 0.1 K/uL  BASIC METABOLIC PANEL     Status: Abnormal   Collection Time    01/02/14  1:57 AM      Result Value Ref Range   Sodium 140  137 - 147 mEq/L   Potassium 4.6  3.7 - 5.3 mEq/L   Chloride 99  96 - 112 mEq/L   CO2 31  19 - 32 mEq/L   Glucose, Bld 229 (*) 70 - 99 mg/dL   BUN 15  6 - 23 mg/dL   Creatinine, Ser 1.09  0.50 - 1.10 mg/dL   Calcium 8.7  8.4 - 10.5 mg/dL   GFR calc non Af Amer 49 (*) >90 mL/min   GFR calc Af Amer 57 (*) >90 mL/min   Comment: (NOTE)      The eGFR has been calculated using the CKD EPI equation.     This calculation has not been validated in all clinical situations.     eGFR's persistently <90 mL/min signify possible Chronic Kidney     Disease.   Anion gap 10  5 - 15  URINALYSIS, ROUTINE  W REFLEX MICROSCOPIC     Status: Abnormal   Collection Time    01/02/14  2:24 AM      Result Value Ref Range   Color, Urine YELLOW  YELLOW   APPearance CLOUDY (*) CLEAR   Specific Gravity, Urine 1.008  1.005 - 1.030   pH 7.0  5.0 - 8.0   Glucose, UA NEGATIVE  NEGATIVE mg/dL   Hgb urine dipstick NEGATIVE  NEGATIVE   Bilirubin Urine NEGATIVE  NEGATIVE   Ketones, ur NEGATIVE  NEGATIVE mg/dL   Protein, ur NEGATIVE  NEGATIVE mg/dL   Urobilinogen, UA 0.2  0.0 - 1.0 mg/dL   Nitrite NEGATIVE  NEGATIVE   Leukocytes, UA TRACE (*) NEGATIVE  URINE MICROSCOPIC-ADD ON     Status: Abnormal   Collection Time    01/02/14  2:24 AM      Result Value Ref Range   Squamous Epithelial / LPF MANY (*) RARE   WBC, UA 3-6  <3 WBC/hpf   Bacteria, UA RARE  RARE  CBG MONITORING, ED     Status: Abnormal   Collection Time    01/02/14  5:26 AM      Result Value Ref Range   Glucose-Capillary 140 (*) 70 - 99 mg/dL  TROPONIN I     Status: None   Collection Time    01/02/14  8:20 AM      Result Value Ref Range   Troponin I <0.30  <0.30 ng/mL   Comment:            Due to the release kinetics of cTnI,     a negative result within the first hours     of the onset of symptoms does not rule out     myocardial infarction with certainty.     If myocardial infarction is still suspected,     repeat the test at appropriate intervals.  CBG MONITORING, ED     Status: Abnormal   Collection Time    01/02/14  9:16 AM      Result Value Ref Range   Glucose-Capillary 341 (*) 70 - 99 mg/dL  CBG MONITORING, ED     Status: Abnormal   Collection Time    01/02/14 12:23 PM      Result Value Ref Range   Glucose-Capillary 232 (*) 70 - 99 mg/dL  CBG MONITORING, ED     Status:  None   Collection Time    01/02/14  5:43 PM      Result Value Ref Range   Glucose-Capillary 70  70 - 99 mg/dL  VITAMIN B12     Status: None   Collection Time    01/02/14  6:39 PM      Result Value Ref Range   Vitamin B-12 551  211 - 911 pg/mL   Comment: Performed at [facility]     Status: None   Collection Time    01/02/14  6:39 PM      Result Value Ref Range   Folate >20.0     Comment: (NOTE)     Reference Ranges            Deficient:       0.4 - 3.3 ng/mL            Indeterminate:   3.4 - 5.4 ng/mL            Normal:              > 5.4  ng/mL     Performed at [facility], ED     Status: Abnormal   Collection Time    01/02/14  9:00 PM      Result Value Ref Range   Glucose-Capillary 200 (*) 70 - 99 mg/dL  CBG MONITORING, ED     Status: Abnormal   Collection Time    01/03/14  3:45 AM      Result Value Ref Range   Glucose-Capillary 46 (*) 70 - 99 mg/dL  CBG MONITORING, ED     Status: Abnormal   Collection Time    01/03/14  4:06 AM      Result Value Ref Range   Glucose-Capillary 45 (*) 70 - 99 mg/dL   Comment 1 Notify RN    CBG MONITORING, ED     Status: Abnormal   Collection Time    01/03/14  4:33 AM      Result Value Ref Range   Glucose-Capillary 60 (*) 70 - 99 mg/dL  CBG MONITORING, ED     Status: Abnormal   Collection Time    01/03/14  5:22 AM      Result Value Ref Range   Glucose-Capillary 165 (*) 70 - 99 mg/dL  CBG MONITORING, ED     Status: Abnormal   Collection Time    01/03/14  7:10 AM      Result Value Ref Range   Glucose-Capillary 261 (*) 70 - 99 mg/dL  CBG MONITORING, ED     Status: Abnormal   Collection Time    01/03/14  9:30 AM      Result Value Ref Range   Glucose-Capillary 349 (*) 70 - 99 mg/dL  CBG MONITORING, ED     Status: Abnormal   Collection Time    01/03/14 11:56 AM      Result Value Ref Range   Glucose-Capillary 164 (*) 70 - 99 mg/dL   Labs are reviewed and are pertinent for  Anemia,  hyperglycemia Head MRI- 1. Stable age advanced white matter disease. This likely reflects  the sequela of chronic microvascular ischemia.  2. No acute intracranial abnormality or significant interval change.   Current Facility-Administered Medications  Medication Dose Route Frequency Provider Last Rate Last Dose  . acetaminophen (TYLENOL) tablet 650 mg  650 mg Oral Q4H PRN Ankit Nanavati, MD      . divalproex (DEPAKOTE) DR tablet 125 mg  125 mg Oral Q12H Mirna Mires, MD   125 mg at 01/03/14 1018  . insulin aspart (novoLOG) injection 0-9 Units  0-9 Units Subcutaneous TID WC Varney Biles, MD   7 Units at 01/03/14 1017  . insulin detemir (LEVEMIR) injection 14 Units  14 Units Subcutaneous Q1200 Varney Biles, MD   14 Units at 01/02/14 1300  . lisinopril (PRINIVIL,ZESTRIL) tablet 20 mg  20 mg Oral Daily Ankit Nanavati, MD   20 mg at 01/03/14 1019  . LORazepam (ATIVAN) tablet 0.5 mg  0.5 mg Oral QHS Ankit Nanavati, MD   0.5 mg at 01/02/14 2132  . metoprolol succinate (TOPROL-XL) 24 hr tablet 100 mg  100 mg Oral Daily Ankit Nanavati, MD   100 mg at 01/03/14 1018  . ondansetron (ZOFRAN) tablet 4 mg  4 mg Oral Q8H PRN Varney Biles, MD   4 mg at 01/02/14 2145  . pantoprazole (PROTONIX) EC tablet 40 mg  40 mg Oral Daily Ankit Nanavati, MD   40 mg at 01/03/14 1019  . predniSONE (DELTASONE) tablet 10 mg  10 mg Oral Daily Varney Biles, MD   10 mg at 01/03/14 1019  . risperiDONE (RISPERDAL) tablet 0.5 mg  0.5 mg Oral BH-q7a Hoy Morn, MD   0.5 mg at 01/03/14 1324  . risperiDONE (RISPERDAL) tablet 1 mg  1 mg Oral QHS Hoy Morn, MD   1 mg at 01/02/14 2132  . sertraline (ZOLOFT) tablet 50 mg  50 mg Oral Daily Ankit Nanavati, MD   50 mg at 01/03/14 1019  . traMADol (ULTRAM) tablet 50 mg  50 mg Oral Q12H Ankit Nanavati, MD   50 mg at 01/03/14 1019   Current Outpatient Prescriptions  Medication Sig Dispense Refill  . acetaminophen (TYLENOL) 500 MG tablet Take 500 mg by mouth every 6 (six)  hours as needed.      Marland Kitchen albuterol (PROVENTIL) (5 MG/ML) 0.5% nebulizer solution Take 2.5 mg by nebulization every 6 (six) hours as needed for wheezing or shortness of breath.      . clotrimazole-betamethasone (LOTRISONE) cream Apply 1 application topically 2 (two) times daily.       . Coenzyme Q10 (CO Q 10) 100 MG CAPS Take 100 mg by mouth daily.      Marland Kitchen docusate sodium (COLACE) 100 MG capsule Take 100 mg by mouth daily.       Marland Kitchen esomeprazole (NEXIUM) 20 MG capsule Take 20 mg by mouth 2 (two) times daily.      Marland Kitchen estrogens, conjugated, (PREMARIN) 0.625 MG tablet Take 0.625 mg by mouth daily.       . hydrocortisone (ANUSOL-HC) 25 MG suppository Insert one suppository daily as needed for hemorrhoids.  30 suppository  1  . insulin aspart (NOVOLOG FLEXPEN) 100 UNIT/ML injection Inject 3-12 Units into the skin 3 (three) times daily before meals. Sliding scale      . insulin detemir (LEVEMIR FLEXPEN) 100 UNIT/ML injection Inject 14 Units into the skin daily at 12 noon.       . levalbuterol (XOPENEX HFA) 45 MCG/ACT inhaler Inhale 1-2 puffs into the lungs every 4 (four) hours as needed for wheezing.      Marland Kitchen lisinopril (PRINIVIL,ZESTRIL) 20 MG tablet Take 20 mg by mouth daily.      Marland Kitchen LORazepam (ATIVAN) 0.5 MG tablet Take 0.5 mg by mouth at bedtime.       . metoprolol (TOPROL XL) 100 MG 24 hr tablet Take 100 mg by mouth daily.       . Multiple Vitamin (MULTIVITAMINS PO) Take 1 tablet by mouth. Daily.       . naproxen sodium (ANAPROX) 220 MG tablet Take 220 mg by mouth 2 (two) times daily with a meal.      . potassium chloride SA (K-DUR,KLOR-CON) 20 MEQ tablet Take 20 mEq by mouth 3 (three) times a week.       . predniSONE (DELTASONE) 10 MG tablet Take 10 mg by mouth daily.      . pregabalin (LYRICA) 75 MG capsule Take 75 mg by mouth daily.       . promethazine (PHENERGAN) 12.5 MG tablet Take 12.5 mg by mouth every 6 (six) hours as needed for nausea.      . risperiDONE (RISPERDAL) 0.5 MG tablet Take 0.5 mg by  mouth 2 (two) times daily.      . sertraline (ZOLOFT) 50 MG tablet Take 50 mg by mouth daily.       . traMADol (ULTRAM) 50 MG tablet Take 50 mg by mouth every 12 (twelve) hours.       Marland Kitchen  Vitamin D-Vitamin K (VITAMIN K2-VITAMIN D3 PO) Take 1 tablet by mouth daily.      . AMBULATORY NON FORMULARY MEDICATION Continuous O2 @@ 2.5-3 LMP        Psychiatric Specialty Exam:     Blood pressure 158/70, pulse 104, temperature 98.3 F (36.8 C), temperature source Oral, resp. rate 28, height $RemoveBe'4\' 11"'YjhfGMeXO$  (1.499 m), weight 49.896 kg (110 lb), SpO2 100.00%.Body mass index is 22.21 kg/(m^2).  General Appearance: Fairly Groomed  Engineer, water::  Good  Speech:  Normal Rate  Volume:  Normal  Mood:  states she feels depressed, but affect reactive, and smiles often and appropriately  Affect:  Appropriate  Thought Process:  Goal Directed and Linear  Orientation:  Full (Time, Place, and Person)- patient is fully oriented x 3   Thought Content:  Delusions and Hallucinations: Auditory Visual  Suicidal Thoughts:  No- denies any suicidal or homicidal ideations  Homicidal Thoughts:  No  Memory:  recednt and remote grossly intact, recall 3/3 imm and 3/3 at 3 minutes   Judgement:  Impaired  Insight:  Lacking  Psychomotor Activity:  Normal  Concentration:  Good  Recall:  Good  Fund of Knowledge:Good  Language: Good  Akathisia:  No  Handed:  Right  AIMS (if indicated):     Assets:  Desire for Improvement Resilience  Sleep:      Musculoskeletal: Strength & Muscle Tone: describes joint pain, and has hand/wrist deformity related to chronic arthritis Gait & Station: gait not examined Patient leans: N/A  Treatment Plan Summary: 1. Geriatric Psychiatric placement is pending . 2. Continue Risperidone to 0.5 mgrs QAM and 1 mgr QHS for hallucination and paranoia and Zoloft 50 mg QDAY for depression.  3. Would consider possibility of Steroid Induced Psychosis, although less likely in the context of chronic steroid  management and emergence of symptoms recently in spite of no recent increase in dose. 4. Based on history of gastric atrophy/iron malabsorption concerns, would consider obtaining B12, Folate, Vitamin D3, all of which can contribute to psychiatric illnesses if chronically deficient.    Jelisha Weed,JANARDHAHA R. 01/03/2014 2:59 PM

## 2014-01-03 NOTE — Progress Notes (Signed)
MHT completed follow up at the following psychiatric facilities for placement:  1)[facility]-under review 2)[facility]-declined due to medical 3)[facility]-under review 4)[facility]-faxed referral 5)[facility]-no beds 6)[facility]-no answer 7)[facility]-declined due to [place], MHT/NS

## 2014-01-04 ENCOUNTER — Emergency Department (HOSPITAL_COMMUNITY): Payer: Medicare Other

## 2014-01-04 ENCOUNTER — Inpatient Hospital Stay (HOSPITAL_COMMUNITY): Payer: Medicare Other

## 2014-01-04 ENCOUNTER — Encounter (HOSPITAL_COMMUNITY): Payer: Self-pay | Admitting: Radiology

## 2014-01-04 DIAGNOSIS — IMO0002 Reserved for concepts with insufficient information to code with codable children: Secondary | ICD-10-CM | POA: Diagnosis not present

## 2014-01-04 DIAGNOSIS — R5383 Other fatigue: Secondary | ICD-10-CM | POA: Diagnosis not present

## 2014-01-04 DIAGNOSIS — Z880 Allergy status to penicillin: Secondary | ICD-10-CM | POA: Diagnosis not present

## 2014-01-04 DIAGNOSIS — J9610 Chronic respiratory failure, unspecified whether with hypoxia or hypercapnia: Secondary | ICD-10-CM | POA: Diagnosis not present

## 2014-01-04 DIAGNOSIS — D649 Anemia, unspecified: Secondary | ICD-10-CM | POA: Diagnosis present

## 2014-01-04 DIAGNOSIS — Z79899 Other long term (current) drug therapy: Secondary | ICD-10-CM | POA: Diagnosis not present

## 2014-01-04 DIAGNOSIS — R5381 Other malaise: Secondary | ICD-10-CM | POA: Diagnosis not present

## 2014-01-04 DIAGNOSIS — R109 Unspecified abdominal pain: Secondary | ICD-10-CM | POA: Diagnosis not present

## 2014-01-04 DIAGNOSIS — E1169 Type 2 diabetes mellitus with other specified complication: Secondary | ICD-10-CM | POA: Diagnosis present

## 2014-01-04 DIAGNOSIS — F22 Delusional disorders: Secondary | ICD-10-CM | POA: Diagnosis present

## 2014-01-04 DIAGNOSIS — J45909 Unspecified asthma, uncomplicated: Secondary | ICD-10-CM | POA: Diagnosis present

## 2014-01-04 DIAGNOSIS — F411 Generalized anxiety disorder: Secondary | ICD-10-CM | POA: Diagnosis present

## 2014-01-04 DIAGNOSIS — F329 Major depressive disorder, single episode, unspecified: Secondary | ICD-10-CM | POA: Diagnosis present

## 2014-01-04 DIAGNOSIS — E119 Type 2 diabetes mellitus without complications: Secondary | ICD-10-CM | POA: Diagnosis not present

## 2014-01-04 DIAGNOSIS — Z794 Long term (current) use of insulin: Secondary | ICD-10-CM | POA: Diagnosis not present

## 2014-01-04 DIAGNOSIS — R197 Diarrhea, unspecified: Secondary | ICD-10-CM | POA: Diagnosis not present

## 2014-01-04 DIAGNOSIS — N179 Acute kidney failure, unspecified: Secondary | ICD-10-CM | POA: Diagnosis present

## 2014-01-04 DIAGNOSIS — G9340 Encephalopathy, unspecified: Secondary | ICD-10-CM | POA: Diagnosis present

## 2014-01-04 DIAGNOSIS — I252 Old myocardial infarction: Secondary | ICD-10-CM | POA: Diagnosis not present

## 2014-01-04 DIAGNOSIS — I6789 Other cerebrovascular disease: Secondary | ICD-10-CM | POA: Diagnosis not present

## 2014-01-04 DIAGNOSIS — H5316 Psychophysical visual disturbances: Secondary | ICD-10-CM | POA: Diagnosis present

## 2014-01-04 DIAGNOSIS — E162 Hypoglycemia, unspecified: Secondary | ICD-10-CM | POA: Diagnosis not present

## 2014-01-04 DIAGNOSIS — F29 Unspecified psychosis not due to a substance or known physiological condition: Secondary | ICD-10-CM | POA: Diagnosis not present

## 2014-01-04 DIAGNOSIS — I517 Cardiomegaly: Secondary | ICD-10-CM | POA: Diagnosis not present

## 2014-01-04 DIAGNOSIS — F3289 Other specified depressive episodes: Secondary | ICD-10-CM | POA: Diagnosis present

## 2014-01-04 DIAGNOSIS — M069 Rheumatoid arthritis, unspecified: Secondary | ICD-10-CM | POA: Diagnosis present

## 2014-01-04 DIAGNOSIS — E871 Hypo-osmolality and hyponatremia: Secondary | ICD-10-CM | POA: Diagnosis not present

## 2014-01-04 DIAGNOSIS — I10 Essential (primary) hypertension: Secondary | ICD-10-CM | POA: Diagnosis present

## 2014-01-04 DIAGNOSIS — K219 Gastro-esophageal reflux disease without esophagitis: Secondary | ICD-10-CM | POA: Diagnosis present

## 2014-01-04 LAB — CBG MONITORING, ED
GLUCOSE-CAPILLARY: 120 mg/dL — AB (ref 70–99)
Glucose-Capillary: 32 mg/dL — CL (ref 70–99)
Glucose-Capillary: 148 mg/dL — ABNORMAL HIGH (ref 70–99)
Glucose-Capillary: 202 mg/dL — ABNORMAL HIGH (ref 70–99)
Glucose-Capillary: 205 mg/dL — ABNORMAL HIGH (ref 70–99)
Glucose-Capillary: 148 mg/dL — ABNORMAL HIGH (ref 70–99)
Glucose-Capillary: 69 mg/dL — ABNORMAL LOW (ref 70–99)
Glucose-Capillary: 251 mg/dL — ABNORMAL HIGH (ref 70–99)
Glucose-Capillary: 364 mg/dL — ABNORMAL HIGH (ref 70–99)

## 2014-01-04 LAB — URINALYSIS, ROUTINE W REFLEX MICROSCOPIC
Bilirubin Urine: NEGATIVE
Glucose, UA: NEGATIVE mg/dL
Hgb urine dipstick: NEGATIVE
KETONES UR: NEGATIVE mg/dL
Leukocytes, UA: NEGATIVE
Nitrite: NEGATIVE
Protein, ur: NEGATIVE mg/dL
SPECIFIC GRAVITY, URINE: 1.008 (ref 1.005–1.030)
Urobilinogen, UA: 0.2 mg/dL (ref 0.0–1.0)
pH: 7 (ref 5.0–8.0)

## 2014-01-04 LAB — COMPREHENSIVE METABOLIC PANEL
ALBUMIN: 2.7 g/dL — AB (ref 3.5–5.2)
ALT: 7 U/L (ref 0–35)
AST: 13 U/L (ref 0–37)
Alkaline Phosphatase: 122 U/L — ABNORMAL HIGH (ref 39–117)
Anion gap: 13 (ref 5–15)
BUN: 19 mg/dL (ref 6–23)
CALCIUM: 8.1 mg/dL — AB (ref 8.4–10.5)
CHLORIDE: 92 meq/L — AB (ref 96–112)
CO2: 28 mEq/L (ref 19–32)
CREATININE: 1.11 mg/dL — AB (ref 0.50–1.10)
GFR calc Af Amer: 56 mL/min — ABNORMAL LOW (ref >90)
GFR calc non Af Amer: 48 mL/min — ABNORMAL LOW (ref >90)
Glucose, Bld: 269 mg/dL — ABNORMAL HIGH (ref 70–99)
Potassium: 4.8 mEq/L (ref 3.7–5.3)
Sodium: 133 mEq/L — ABNORMAL LOW (ref 137–147)
TOTAL PROTEIN: 5.5 g/dL — AB (ref 6.0–8.3)
Total Bilirubin: 0.3 mg/dL (ref 0.3–1.2)

## 2014-01-04 LAB — CBC WITH DIFFERENTIAL/PLATELET
BASOS ABS: 0 10*3/uL (ref 0.0–0.1)
BASOS PCT: 0 % (ref 0–1)
EOS PCT: 1 % (ref 0–5)
Eosinophils Absolute: 0.1 10*3/uL (ref 0.0–0.7)
HEMATOCRIT: 28.3 % — AB (ref 36.0–46.0)
Hemoglobin: 9 g/dL — ABNORMAL LOW (ref 12.0–15.0)
Lymphocytes Relative: 4 % — ABNORMAL LOW (ref 12–46)
Lymphs Abs: 0.3 10*3/uL — ABNORMAL LOW (ref 0.7–4.0)
MCH: 28.1 pg (ref 26.0–34.0)
MCHC: 31.8 g/dL (ref 30.0–36.0)
MCV: 88.4 fL (ref 78.0–100.0)
Monocytes Absolute: 0.5 10*3/uL (ref 0.1–1.0)
Monocytes Relative: 6 % (ref 3–12)
NEUTROS ABS: 6.9 10*3/uL (ref 1.7–7.7)
Neutrophils Relative %: 89 % — ABNORMAL HIGH (ref 43–77)
Platelets: 259 10*3/uL (ref 150–400)
RBC: 3.2 MIL/uL — ABNORMAL LOW (ref 3.87–5.11)
RDW: 14.4 % (ref 11.5–15.5)
WBC: 7.8 10*3/uL (ref 4.0–10.5)

## 2014-01-04 LAB — GLUCOSE, CAPILLARY
GLUCOSE-CAPILLARY: 407 mg/dL — AB (ref 70–99)
GLUCOSE-CAPILLARY: 433 mg/dL — AB (ref 70–99)
GLUCOSE-CAPILLARY: 305 mg/dL — AB (ref 70–99)
Glucose-Capillary: 454 mg/dL — ABNORMAL HIGH (ref 70–99)

## 2014-01-04 LAB — LIPASE, BLOOD: Lipase: 23 U/L (ref 11–59)

## 2014-01-04 LAB — VALPROIC ACID LEVEL: VALPROIC ACID LVL: 16.1 ug/mL — AB (ref 50.0–100.0)

## 2014-01-04 MED ORDER — ENOXAPARIN SODIUM 40 MG/0.4ML ~~LOC~~ SOLN
40.0000 mg | SUBCUTANEOUS | Status: DC
  Administered 2014-01-04: 40 mg via SUBCUTANEOUS
  Filled 2014-01-04 (×2): qty 0.4

## 2014-01-04 MED ORDER — CETYLPYRIDINIUM CHLORIDE 0.05 % MT LIQD
7.0000 mL | Freq: Two times a day (BID) | OROMUCOSAL | Status: DC
  Administered 2014-01-04 – 2014-01-12 (×15): 7 mL via OROMUCOSAL

## 2014-01-04 MED ORDER — IOHEXOL 300 MG/ML  SOLN
80.0000 mL | Freq: Once | INTRAMUSCULAR | Status: AC | PRN
  Administered 2014-01-04: 80 mL via INTRAVENOUS

## 2014-01-04 MED ORDER — INFLUENZA VAC SPLIT QUAD 0.5 ML IM SUSY
0.5000 mL | PREFILLED_SYRINGE | INTRAMUSCULAR | Status: AC
  Administered 2014-01-05: 0.5 mL via INTRAMUSCULAR
  Filled 2014-01-04: qty 0.5

## 2014-01-04 MED ORDER — INSULIN ASPART 100 UNIT/ML ~~LOC~~ SOLN
5.0000 [IU] | Freq: Once | SUBCUTANEOUS | Status: AC
  Administered 2014-01-04: 5 [IU] via SUBCUTANEOUS

## 2014-01-04 MED ORDER — ALBUTEROL SULFATE (2.5 MG/3ML) 0.083% IN NEBU: 2.5000 mg | INHALATION_SOLUTION | Freq: Four times a day (QID) | RESPIRATORY_TRACT | Status: DC | PRN

## 2014-01-04 MED ORDER — INSULIN ASPART 100 UNIT/ML ~~LOC~~ SOLN
0.0000 [IU] | Freq: Three times a day (TID) | SUBCUTANEOUS | Status: DC
Start: 2014-01-05 — End: 2014-01-12
  Administered 2014-01-05: 5 [IU] via SUBCUTANEOUS
  Administered 2014-01-06: 18:00:00 via SUBCUTANEOUS
  Administered 2014-01-06: 2 [IU] via SUBCUTANEOUS
  Administered 2014-01-07: 3 [IU] via SUBCUTANEOUS
  Administered 2014-01-07: 2 [IU] via SUBCUTANEOUS
  Administered 2014-01-08: 7 [IU] via SUBCUTANEOUS
  Administered 2014-01-08: 2 [IU] via SUBCUTANEOUS
  Administered 2014-01-09 (×2): 7 [IU] via SUBCUTANEOUS
  Administered 2014-01-09: 2 [IU] via SUBCUTANEOUS
  Administered 2014-01-10 (×2): 3 [IU] via SUBCUTANEOUS
  Administered 2014-01-11: 2 [IU] via SUBCUTANEOUS
  Administered 2014-01-11: 3 [IU] via SUBCUTANEOUS
  Administered 2014-01-11: 2 [IU] via SUBCUTANEOUS
  Administered 2014-01-12: 10 [IU] via SUBCUTANEOUS
  Administered 2014-01-12: 2 [IU] via SUBCUTANEOUS

## 2014-01-04 MED ORDER — ENSURE COMPLETE PO LIQD
237.0000 mL | Freq: Two times a day (BID) | ORAL | Status: DC
  Administered 2014-01-05 – 2014-01-12 (×12): 237 mL via ORAL

## 2014-01-04 MED ORDER — ACETAMINOPHEN 500 MG PO TABS: 500.0000 mg | ORAL_TABLET | Freq: Four times a day (QID) | ORAL | Status: DC | PRN

## 2014-01-04 MED ORDER — DEXTROSE-NACL 5-0.45 % IV SOLN
INTRAVENOUS | Status: DC
  Administered 2014-01-04: 02:00:00 via INTRAVENOUS

## 2014-01-04 MED ORDER — DEXTROSE 50 % IV SOLN
50.0000 mL | Freq: Once | INTRAVENOUS | Status: AC
  Administered 2014-01-04: 50 mL via INTRAVENOUS
  Filled 2014-01-04: qty 50

## 2014-01-04 MED ORDER — INSULIN ASPART 100 UNIT/ML ~~LOC~~ SOLN: 0.0000 [IU] | Freq: Three times a day (TID) | SUBCUTANEOUS | Status: DC

## 2014-01-04 MED ORDER — INSULIN DETEMIR 100 UNIT/ML ~~LOC~~ SOLN
6.0000 [IU] | Freq: Every day | SUBCUTANEOUS | Status: DC
  Administered 2014-01-05: 6 [IU] via SUBCUTANEOUS
  Filled 2014-01-04 (×2): qty 0.06

## 2014-01-04 MED ORDER — ONDANSETRON HCL 4 MG/2ML IJ SOLN: 4.0000 mg | Freq: Once | INTRAMUSCULAR | Status: DC

## 2014-01-04 MED ORDER — ONDANSETRON HCL 4 MG/2ML IJ SOLN
4.0000 mg | Freq: Once | INTRAMUSCULAR | Status: AC
  Administered 2014-01-04: 4 mg via INTRAVENOUS
  Filled 2014-01-04: qty 2

## 2014-01-04 MED ORDER — DEXTROSE 5 % IV SOLN: INTRAVENOUS | Status: DC

## 2014-01-04 MED ORDER — IOHEXOL 300 MG/ML  SOLN: 25.0000 mL | INTRAMUSCULAR | Status: AC

## 2014-01-04 MED ORDER — DEXTROSE-NACL 5-0.45 % IV SOLN: INTRAVENOUS | Status: DC

## 2014-01-04 MED ORDER — INSULIN ASPART 100 UNIT/ML ~~LOC~~ SOLN
0.0000 [IU] | SUBCUTANEOUS | Status: DC
  Administered 2014-01-04: 5 [IU] via SUBCUTANEOUS

## 2014-01-04 NOTE — ED Notes (Signed)
Patient is resting comfortably. 

## 2014-01-04 NOTE — ED Notes (Signed)
Pt. Lethargic. Does open eyes to sternal rub. cbg 69.  Pt. Given 120 cc of oj and tolerated well.

## 2014-01-04 NOTE — ED Notes (Signed)
Patient denies pain and is resting comfortably.  

## 2014-01-04 NOTE — Progress Notes (Signed)
Notified Baltazar Najjar, NP that pt has sitter at bedside. ED stated that pt has been held in ED for days waiting on psych bed. Baltazar Najjar, NP put in order for suicide precautions and MD can address in am. Suicide precautions initiated with dayshift RN Vaughan Basta at [phone]. Notified Baltazar Najjar, NP also that pt's CBG is 433. Baltazar Najjar entered order for Novolog 5 units SQx1. Will continue to monitor pt. Ranelle Oyster, RN

## 2014-01-04 NOTE — H&P (Addendum)
[facility] History and Physical  Marisa [name] DGU:[id number] DOB: 28-Aug-1940 DOA: 01/02/2014  Referring physician: Dr [name] PCP: Tommy Medal, MD   Chief Complaint: hypoglycemia.   HPI: Marisa [name] is a 73 y.o. female with prior h/o diabetes mellitus, asthma, was in ED for psychotic episode/ auditory and visual hallucinations, was scheduled to be transferred to [facility] for inpatient hospitalization, when she developed hypoglycemic episodes on dextrose fluids. She also reports generalized weakness .  She also reported some abdominal pain to the EDP earlier this am. She underwent CT abdomen and pelvis , which did not reveal any acute pathology. She denies any abdominal pain after her bowel movement this afternoon. She is referred to medical service for evaluation and management of hypoglycemic episodes.    Review of Systems:  Constitutional:  No weight loss, night sweats, Fevers, chills, fatigue.  HEENT:  No headaches, Difficulty swallowing,Tooth/dental problems,Sore throat,  No sneezing, itching, ear ache, nasal congestion, post nasal drip,  Cardio-vascular:  No chest pain, Orthopnea, PND, swelling in lower extremities, anasarca, dizziness, palpitations  GI:  No heartburn, indigestion, abdominal pain, nausea, vomiting, diarrhea, change in bowel habits, loss of appetite  Resp:  No shortness of breath with exertion or at rest. No excess mucus, no productive cough, No non-productive cough, No coughing up of blood.No change in color of mucus.No wheezing.No chest wall deformity  Skin:  no rash or lesions.  GU:  no dysuria, change in color of urine, no urgency or frequency. No flank pain.  Musculoskeletal:  No joint pain or swelling. No decreased range of motion. No back pain.  Psych:  No change in mood or affect. No depression or anxiety. No memory loss.   Past Medical History  Diagnosis Date  . Diabetic ketoacidosis 11/2010  . Myocardial infarction     Chest  pain s/p normal cath in 08/2004 then NSTEMI during 11/2010 admission, negative Myoview  . Diabetes mellitus type 2, insulin dependent     initial diagnoses 11/2008  . GERD (gastroesophageal reflux disease)   . Esophageal stricture 06/2004    Dilation 06/2004  . Hypertension   . Supraventricular tachycardia   . history of  pericarditis 12/2002  . Pericardial effusion   . Rheumatoid arthritis(714.0)     On MTX and chronic steroids  . Osteoarthritis   . Asthma     Home 3L O2   . Seizure 11/2008  . Rectal bleeding 11/2010    Large hemorrhoids  . Chronic anemia   . History of blood transfusion   . Diverticulosis   . Anemia    Past Surgical History  Procedure Laterality Date  . Cholecystectomy    . Abdominal hysterectomy    . Incise and drain abcess  09/2011    I&D of peri-rectal abcess per Dr Zella Richer.   . Colonoscopy N/A 01/06/2013    Procedure: COLONOSCOPY;  Surgeon: Ladene Artist, MD;  Location: [facility] ENDOSCOPY;  Service: Endoscopy;  Laterality: N/A;  . Esophagogastroduodenoscopy N/A 02/11/2013    Procedure: ESOPHAGOGASTRODUODENOSCOPY (EGD);  Surgeon: Irene Shipper, MD;  Location: [facility] ENDOSCOPY;  Service: Endoscopy;  Laterality: N/A;   Social History:  reports that she has never smoked. She has never used smokeless tobacco. She reports that she does not drink alcohol or use illicit drugs.  Allergies  Allergen Reactions  . Demerol [Meperidine] Other (See Comments)    Hallucinations  . Penicillins Swelling and Other (See Comments)    Throat swelling    Family History  Problem Relation  Age of Onset  . Heart disease Mother   . Stroke Father      Prior to Admission medications   Medication Sig Start Date End Date Taking? Authorizing Provider  acetaminophen (TYLENOL) 500 MG tablet Take 500 mg by mouth every 6 (six) hours as needed.   Yes Historical Provider, MD  albuterol (PROVENTIL) (5 MG/ML) 0.5% nebulizer solution Take 2.5 mg by nebulization every 6 (six) hours as needed for  wheezing or shortness of breath. 10/08/11  Yes Nishant Dhungel, MD  clotrimazole-betamethasone (LOTRISONE) cream Apply 1 application topically 2 (two) times daily.  06/01/13  Yes Historical Provider, MD  Coenzyme Q10 (CO Q 10) 100 MG CAPS Take 100 mg by mouth daily.   Yes Historical Provider, MD  docusate sodium (COLACE) 100 MG capsule Take 100 mg by mouth daily.    Yes Historical Provider, MD  esomeprazole (NEXIUM) 20 MG capsule Take 20 mg by mouth 2 (two) times daily.   Yes Historical Provider, MD  estrogens, conjugated, (PREMARIN) 0.625 MG tablet Take 0.625 mg by mouth daily.    Yes Historical Provider, MD  hydrocortisone (ANUSOL-HC) 25 MG suppository Insert one suppository daily as needed for hemorrhoids. 08/26/13  Yes Lennis Marion Downer, MD  insulin aspart (NOVOLOG FLEXPEN) 100 UNIT/ML injection Inject 3-12 Units into the skin 3 (three) times daily before meals. Sliding scale   Yes Historical Provider, MD  insulin detemir (LEVEMIR FLEXPEN) 100 UNIT/ML injection Inject 14 Units into the skin daily at 12 noon.    Yes Historical Provider, MD  levalbuterol [facility] HFA) 45 MCG/ACT inhaler Inhale 1-2 puffs into the lungs every 4 (four) hours as needed for wheezing.   Yes Historical Provider, MD  lisinopril (PRINIVIL,ZESTRIL) 20 MG tablet Take 20 mg by mouth daily.   Yes Historical Provider, MD  LORazepam (ATIVAN) 0.5 MG tablet Take 0.5 mg by mouth at bedtime.    Yes Historical Provider, MD  metoprolol (TOPROL XL) 100 MG 24 hr tablet Take 100 mg by mouth daily.    Yes Historical Provider, MD  Multiple Vitamin (MULTIVITAMINS PO) Take 1 tablet by mouth. Daily.    Yes Historical Provider, MD  naproxen sodium (ANAPROX) 220 MG tablet Take 220 mg by mouth 2 (two) times daily with a meal.   Yes Historical Provider, MD  potassium chloride SA (K-DUR,KLOR-CON) 20 MEQ tablet Take 20 mEq by mouth 3 (three) times a week.    Yes Historical Provider, MD  predniSONE (DELTASONE) 10 MG tablet Take 10 mg by mouth daily.    Yes Historical Provider, MD  pregabalin (LYRICA) 75 MG capsule Take 75 mg by mouth daily.    Yes Historical Provider, MD  promethazine (PHENERGAN) 12.5 MG tablet Take 12.5 mg by mouth every 6 (six) hours as needed for nausea.   Yes Historical Provider, MD  risperiDONE (RISPERDAL) 0.5 MG tablet Take 0.5 mg by mouth 2 (two) times daily.   Yes Historical Provider, MD  sertraline (ZOLOFT) 50 MG tablet Take 50 mg by mouth daily.  05/30/13  Yes Historical Provider, MD  traMADol (ULTRAM) 50 MG tablet Take 50 mg by mouth every 12 (twelve) hours.  09/14/13  Yes Historical Provider, MD  Vitamin D-Vitamin K (VITAMIN K2-VITAMIN D3 PO) Take 1 tablet by mouth daily.   Yes Historical Provider, MD  AMBULATORY NON FORMULARY MEDICATION Continuous O2 @@ 2.5-3 LMP    Historical Provider, MD   Physical Exam: Filed Vitals:   01/04/14 1357 01/04/14 1400 01/04/14 1645 01/04/14 1646  BP:  94/53 95/60  Pulse:  85  89  Temp: 98.7 F (37.1 C)     TempSrc: Rectal     Resp:  22    Height:      Weight:      SpO2:  100%  99%    Wt Readings from Last 3 Encounters:  01/02/14 49.896 kg (110 lb)  11/17/13 52.617 kg (116 lb)  10/06/13 56.79 kg (125 lb 3.2 oz)    General:  Appears calm and comfortable Eyes: PERRL, normal lids, irises & conjunctiva Neck: no LAD, masses or thyromegaly Cardiovascular: RRR, no m/r/g. No LE edema. Respiratory: CTA bilaterally, no w/r/r. Normal respiratory effort. Abdomen: soft, ntnd Skin: no rash or induration seen on limited exam Musculoskeletal: grossly normal tone BUE/BLE Neurologic: grossly non-focal.          Labs on Admission:  Basic Metabolic Panel:  Recent Labs Lab 01/02/14 0157 01/04/14 1312  NA 140 133*  K 4.6 4.8  CL 99 92*  CO2 31 28  GLUCOSE 229* 269*  BUN 15 19  CREATININE 1.09 1.11*  CALCIUM 8.7 8.1*   Liver Function Tests:  Recent Labs Lab 01/04/14 1312  AST 13  ALT 7  ALKPHOS 122*  BILITOT 0.3  PROT 5.5*  ALBUMIN 2.7*    Recent Labs Lab  01/04/14 1301  LIPASE 23   No results found for this basename: AMMONIA,  in the last 168 hours CBC:  Recent Labs Lab 01/02/14 0157 01/04/14 1312  WBC 7.2 7.8  NEUTROABS 5.5 6.9  HGB 8.5* 9.0*  HCT 26.9* 28.3*  MCV 88.2 88.4  PLT 244 259   Cardiac Enzymes:  Recent Labs Lab 01/02/14 0820  TROPONINI <0.30    BNP (last 3 results) No results found for this basename: PROBNP,  in the last 8760 hours CBG:  Recent Labs Lab 01/04/14 1142 01/04/14 1226 01/04/14 1342 01/04/14 1556 01/04/14 1721  GLUCAP 69* 148* 251* 364* 407*    Radiological Exams on Admission: Ct Abdomen Pelvis W Contrast  01/04/2014   CLINICAL DATA:  Abdominal pain, diarrhea  EXAM: CT ABDOMEN AND PELVIS WITH CONTRAST  TECHNIQUE: Multidetector CT imaging of the abdomen and pelvis was performed using the standard protocol following bolus administration of intravenous contrast.  CONTRAST:  79mL OMNIPAQUE IOHEXOL 300 MG/ML  SOLN  COMPARISON:  06/06/2012  FINDINGS: The lung bases are clear. There is a right Bochdalek's hernia.  The liver demonstrates no focal abnormality. There is no intrahepatic or extrahepatic biliary ductal dilatation. The gallbladder is surgically absent. There are innumerable calcifications within the spleen likely reflecting sequela of prior granulomatous disease.There is a stable hypodense, fluid attenuating 1.6 cm left renal mass most consistent with a cyst. The right kidney, adrenal glands and pancreas are normal. There is mild bilateral nonspecific perinephric stranding. The bladder is unremarkable.  The stomach, duodenum, small intestine, and large intestine demonstrate no contrast extravasation or dilatation. There is no pneumoperitoneum, pneumatosis, or portal venous gas. There is no abdominal or pelvic free fluid. There is no lymphadenopathy. The uterus is surgically absent. The ovaries are normal in size.  The abdominal aorta is normal in caliber with atherosclerosis.  There are no lytic or  sclerotic osseous lesions. There is a chronic L1 mild compression fracture. There is diffuse lumbar spine spondylosis unchanged in the prior exam. There are chronic bilateral superior and inferior pubic rami fractures. There are bilateral pole of rib fractures. There is an old left L3 transverse process fracture.  IMPRESSION: 1. No acute abdominal or pelvic  pathology. 2. Lumbar spine spondylosis.   Electronically Signed   By: Kathreen Devoid   On: 01/04/2014 16:59   Dg Chest Port 1 View  01/04/2014   CLINICAL DATA:  Dyspnea, weakness  EXAM: PORTABLE CHEST - 1 VIEW  COMPARISON:  10/04/2011  FINDINGS: Cardiomediastinal silhouette is stable. No acute infiltrate or pleural effusion. No pulmonary edema. Old right rib fracture is noted.  IMPRESSION: No active disease.   Electronically Signed   By: Lahoma Crocker M.D.   On: 01/04/2014 13:40    EKG: sinus rhythm at 82/min  Assessment/Plan Active Problems:   Hypoglycemia   Acute encephalopathy   1. Hypoglycemia: probably from decreased po intake.  - holding levemir, and oral hypoglycemic drugs.  - IV dextrose fluids stopped as her last cbg was 400.  - q2hr cbg's for the next 12 hours.  If her cbgs are well above 200 , we can resume her levemir at half her dose.  - hgba1c ordered and pending.    Asthma: controlled.  Resume nasal canula oxygen.   Hypertension  Controlled. Resume home medications.    Psychosis and  Auditory hallucination: Further management as per psychiatry.   DVT prophylaxis.      Code Status: full code.  DVT Prophylaxis: Family Communication: daughter at bedside.  Disposition Plan: admit to med surg.   Time spent: 55 min  [facility] Pager [phone]

## 2014-01-04 NOTE — ED Notes (Signed)
Pt. Alert and oriented. Still weak. Ordered tomato soup and ensure for pt.

## 2014-01-04 NOTE — Progress Notes (Signed)
4:17 Attempted report from ED nurse [place]. RN currently busy with report and will call back.  4:20 report received from ED nurse [place] for patient to be admitted into 5w21

## 2014-01-04 NOTE — ED Provider Notes (Signed)
73 year old female with multiple medical problems including insulin-dependent diabetes who presented 2 days ago to 2 psychotic disorder. She was seen and evaluated and initially appeared stable for psychiatric evaluation. Since that time she has been having increased weakness, hypoglycemia, on a D5 drip. Patient was accepted to a psychiatric geriatric unit and I was asked to commit the patient.  Filed Vitals:   01/04/14 1136  BP: 116/53  Pulse: 88  Temp: 99 F (37.2 C)  Resp: 23    Elderly female laying in bed who appears ill. She has her home oxygen in place but is somewhat tachypneic. She complains of rectal pain. Lungs have decreased breath sounds throughout heart is regular rate and rhythm abdomen is soft and diffusely tender to palpation.  Review of labs reveals stable anemia with hemoglobin 8.5. She has had her home level near this continued here and has been getting only NovoLog insulin on a sliding scale. She has continued to be hypoglycemic with this. I nursing report she has had poor by mouth intake. No vomiting or diarrhea has been noted.  1 hypoglycemia- patient has had poor by mouth intake and this may be secondary to decreased by mouth. She has poor appetite and has some associated abdominal pain. She is requiring D5 to maintain her blood sugar.    2 anemia appears stable  3 chronic lung disease  -  patient has appeared to maintain adequate oxygenation here on her 2 L per minute which she got home. I will obtain a chest x-Manish Ruggiero.  4 weakness patient is generally weak is unable to ambulate here. She's not appear in any focal neurological deficits. She had an MRI on 918 which is ordered by her primary care physician which did not show any acute abnormalities.   Plan admission for further blood sugar maintenance. Depakote level has been ordered but has not returned. She is also likely to need further valuation of her normal pain it should have repeat labs to reassess her hemoglobin  and electrolyte status.   12/31/2013   CLINICAL DATA:  Confusion.  Delirium.  EXAM: MRI HEAD WITHOUT AND WITH CONTRAST  TECHNIQUE: Multiplanar, multiecho pulse sequences of the brain and surrounding structures were obtained without and with intravenous contrast.  CONTRAST:  5 mL MultiHance.  COMPARISON:  CT head without contrast 10/21/2013. MRI brain without and with contrast 12/07/2008.  FINDINGS: No acute infarct, hemorrhage, or mass lesion is present. Moderate periventricular and scattered subcortical white matter changes bilaterally are similar to the prior study. The ventricles are of normal size. Flow is present in the major intracranial arteries. No significant extra-axial fluid collection is present. Globes and orbits are intact. Midline structures are normal.  The postcontrast images demonstrate no pathologic enhancement.  IMPRESSION: 1. Stable age advanced white matter disease. This likely reflects the sequela of chronic microvascular ischemia. 2. No acute intracranial abnormality or significant interval change.   Electronically Signed   By: Lawrence Santiago M.D.   On: 12/31/2013 16:00    Discussed with Dr. Karleen Hampshire. Plan admission to med surg bed.    Shaune Pollack, MD 01/04/14 [phone]

## 2014-01-04 NOTE — Progress Notes (Signed)
CM spoke with Dr. Johnney Killian regarding geripsych facility concern regarding UTI. New order being placed for UA via cath to r/o concern for UTI. Disposition CSW, Tamela Oddi, made aware of new order. Edwyna Shell, RN, BSN, Case Manager 01/04/2014 11:42 AM

## 2014-01-04 NOTE — ED Notes (Signed)
Pt to CT

## 2014-01-04 NOTE — ED Notes (Signed)
Talked to Dr. [name] regarding pt. Status. Dr. [name] at bedside. Pt. Does c/o generalized abd. Pain with palpation. Pt. Able to stand with cane and min. Assist. Only takes a few steps. According to husband, pt. Usually ambulatory with no assist and eats well.

## 2014-01-04 NOTE — ED Notes (Signed)
told

## 2014-01-04 NOTE — ED Notes (Signed)
CBG recheck as ordered, pt 202 at this time, resting comfortable. Sitter at the bedside.

## 2014-01-04 NOTE — Progress Notes (Signed)
Pt was accepted to [facility] but due to medical concerns, i.e., weakness and difficulty ambulating pt maybe admitted to a medical floor. CM, Seth Bake will keep SW posted. [facility] informed of changed.   Charlene Brooke, MSW Clinical Social Worker [phone]

## 2014-01-04 NOTE — Progress Notes (Addendum)
SW spoke with Admissions at [facility] who reported that they can not accept pt due to pt having an urinary tract infection and based on the history of pt.'s hemoglobin dropping 2 mg for the last couple of months. Information shared with [facility], CM and RN in Pod C. Will continue to seek placement.   Charlene Brooke, MSW Clinical Social Worker [phone]

## 2014-01-04 NOTE — Progress Notes (Addendum)
Shift event: RN paged secondary to pt needing sitter but needed clarification as to why. Per chart, pt came from inpt psych facility where she was psychotic and having hallucinations. At this time, for pt safety, sitter is ordered for suicide precautions and can be readdressed after psych sees her in am.  Also, sugar over 400 now. (came in with hypoglycemia). Gave 3 U novolog with order for RN to recheck at 10pm and call if still over 200. If still over 200, will give 1/2 dose of basal insulin at bedtime as per attending's note from today. Clance Boll, NP [facility] Addendum: Sugar at 10pm was 305 after Novolog dose. Placed pt on 1/2 Levemir home dose which is 6U and will continue on SSI. Check sugar at 0200, 0400 and then begin ac/hs. Start slow as pt had hypoglycemia on admission.  KKG, NP Addendum: 2am sugar 104, 4am sugar 123. Continue to monitor.

## 2014-01-04 NOTE — Progress Notes (Signed)
Patien oriented to room and Sitter at beside.

## 2014-01-04 NOTE — Progress Notes (Signed)
NURSING PROGRESS NOTE  BONI MACLELLAN [id number] Admission Data: 01/04/2014 6:45 PM Attending Provider: Hosie Poisson, MD [id number], MD Code Status: FULL  Marisa [name] is a 73 y.o. female patient admitted from ED:  -No acute distress noted.  -No complaints of shortness of breath.  -No complaints of chest pain.    Blood pressure 158/69, pulse 94, temperature 98.6 F (37 C), temperature source Oral, resp. rate 20, height 4\' 11"  (1.499 m), weight 49.896 kg (110 lb), SpO2 99.00%.   IV Fluids:  IV in place, occlusive dsg intact without redness, IV cath antecubital right, condition patent and no redness none.   Allergies:  Demerol and Penicillins  Past Medical History:   has a past medical history of Diabetic ketoacidosis (11/2010); Myocardial infarction; Diabetes mellitus type 2, insulin dependent; GERD (gastroesophageal reflux disease); Esophageal stricture (06/2004); Hypertension; Supraventricular tachycardia; history of  pericarditis (12/2002); Pericardial effusion; Rheumatoid arthritis(714.0); Osteoarthritis; Asthma; Seizure (11/2008); Rectal bleeding (11/2010); Chronic anemia; History of blood transfusion; Diverticulosis; Anemia; and Family history of anesthesia complication.  Past Surgical History:   has past surgical history that includes Cholecystectomy; Abdominal hysterectomy; Incise and drain abcess (09/2011); Colonoscopy (N/A, 01/06/2013); and Esophagogastroduodenoscopy (N/A, 02/11/2013).  Skin: stage one noted to L buttocks. Stage 2 noted to right buttocks. Foam applied. Scattered bruising noted to BLE and BUE  Patient/Family orientated to room. Information packet given to patient/family. Admission inpatient armband information verified with patient/family to include name and date of birth and placed on patient arm. Side rails up x 2, fall assessment and education completed with patient/family. Patient/family able to verbalize understanding of risk associated with falls and  verbalized understanding to call for assistance before getting out of bed. Call light within reach. Patient/family able to voice and demonstrate understanding of unit orientation instructions.    Will continue to evaluate and treat per MD orders.  Wallie Renshaw, RN

## 2014-01-04 NOTE — ED Provider Notes (Signed)
Patient seen and evaluated earlier this evening. Had a high blood sugar. Normal mental status. Was given sliding-scale insulin. Recheck is improved from 345-75. Most recent blood sugar 24. IV placed and given D50. Continued to be awake. Moaning. I've asked to hold her Levemir, and she had a similar episode of decreased blood sugar early a.m. yesterday as well. She will continue to have every 4 hour blood sugars and use her sliding scale until placement can be obtained.  Tanna Furry, MD 01/04/14 [phone]

## 2014-01-04 NOTE — ED Notes (Signed)
Pt very restless, crying and diaphoretic, CBG recheck 32 at this time, Dr. Tanna Furry notified, order for D50 and D51/2 NG gotten and given to the pt, pt resting comfortable after intervention.

## 2014-01-05 DIAGNOSIS — Z794 Long term (current) use of insulin: Secondary | ICD-10-CM

## 2014-01-05 DIAGNOSIS — D649 Anemia, unspecified: Secondary | ICD-10-CM

## 2014-01-05 DIAGNOSIS — E119 Type 2 diabetes mellitus without complications: Secondary | ICD-10-CM

## 2014-01-05 DIAGNOSIS — E1169 Type 2 diabetes mellitus with other specified complication: Secondary | ICD-10-CM | POA: Diagnosis not present

## 2014-01-05 LAB — GLUCOSE, CAPILLARY
GLUCOSE-CAPILLARY: 80 mg/dL (ref 70–99)
GLUCOSE-CAPILLARY: 64 mg/dL — AB (ref 70–99)
Glucose-Capillary: 104 mg/dL — ABNORMAL HIGH (ref 70–99)
Glucose-Capillary: 123 mg/dL — ABNORMAL HIGH (ref 70–99)
Glucose-Capillary: 101 mg/dL — ABNORMAL HIGH (ref 70–99)
Glucose-Capillary: 332 mg/dL — ABNORMAL HIGH (ref 70–99)
Glucose-Capillary: 371 mg/dL — ABNORMAL HIGH (ref 70–99)
Glucose-Capillary: 278 mg/dL — ABNORMAL HIGH (ref 70–99)
Glucose-Capillary: 83 mg/dL (ref 70–99)

## 2014-01-05 LAB — BASIC METABOLIC PANEL
ANION GAP: 12 (ref 5–15)
BUN: 23 mg/dL (ref 6–23)
CO2: 27 meq/L (ref 19–32)
Calcium: 8 mg/dL — ABNORMAL LOW (ref 8.4–10.5)
Chloride: 90 mEq/L — ABNORMAL LOW (ref 96–112)
Creatinine, Ser: 1.38 mg/dL — ABNORMAL HIGH (ref 0.50–1.10)
GFR calc Af Amer: 43 mL/min — ABNORMAL LOW (ref >90)
GFR calc non Af Amer: 37 mL/min — ABNORMAL LOW (ref >90)
GLUCOSE: 335 mg/dL — AB (ref 70–99)
POTASSIUM: 5.3 meq/L (ref 3.7–5.3)
SODIUM: 129 meq/L — AB (ref 137–147)

## 2014-01-05 LAB — HEMOGLOBIN A1C
Hgb A1c MFr Bld: 6.4 % — ABNORMAL HIGH (ref <5.7)
Mean Plasma Glucose: 137 mg/dL — ABNORMAL HIGH (ref <117)

## 2014-01-05 MED ORDER — INSULIN DETEMIR 100 UNIT/ML ~~LOC~~ SOLN
8.0000 [IU] | Freq: Two times a day (BID) | SUBCUTANEOUS | Status: DC
  Filled 2014-01-05 (×2): qty 0.08

## 2014-01-05 MED ORDER — INSULIN ASPART 100 UNIT/ML ~~LOC~~ SOLN
5.0000 [IU] | Freq: Once | SUBCUTANEOUS | Status: AC
  Administered 2014-01-05: 5 [IU] via SUBCUTANEOUS

## 2014-01-05 MED ORDER — HYDRALAZINE HCL 20 MG/ML IJ SOLN: 10.0000 mg | Freq: Four times a day (QID) | INTRAMUSCULAR | Status: DC | PRN

## 2014-01-05 MED ORDER — INSULIN ASPART 100 UNIT/ML ~~LOC~~ SOLN: 5.0000 [IU] | Freq: Once | SUBCUTANEOUS | Status: DC

## 2014-01-05 MED ORDER — ENOXAPARIN SODIUM 30 MG/0.3ML ~~LOC~~ SOLN
30.0000 mg | SUBCUTANEOUS | Status: DC
  Administered 2014-01-05 – 2014-01-09 (×5): 30 mg via SUBCUTANEOUS
  Filled 2014-01-05 (×6): qty 0.3

## 2014-01-05 NOTE — Progress Notes (Signed)
Inpatient Diabetes Program Recommendations  AACE/ADA: New Consensus Statement on Inpatient Glycemic Control (2013)  Target Ranges:  Prepandial:   less than 140 mg/dL      Peak postprandial:   less than 180 mg/dL (1-2 hours)      Critically ill patients:  140 - 180 mg/dL    Results for Marisa [name], Marisa [name] (MRN [id number]) as of 01/05/2014 09:43  Ref. Range 01/04/2014 07:22 01/04/2014 10:06 01/04/2014 11:42 01/04/2014 12:26 01/04/2014 13:42 01/04/2014 15:56 01/04/2014 17:21 01/04/2014 19:07 01/04/2014 20:00 01/04/2014 22:04  Glucose-Capillary Latest Range: 70-99 mg/dL 205 (H) 120 (H) 69 (L) 148 (H) 251 (H) 364 (H) 407 (H) 454 (H) 433 (H) 305 (H)    Results for Marisa [name], Marisa [name] (MRN [id number]) as of 01/05/2014 09:43  Ref. Range 01/05/2014 01:48 01/05/2014 03:29 01/05/2014 06:12 01/05/2014 07:49 01/05/2014 08:18  Glucose-Capillary Latest Range: 70-99 mg/dL 104 (H) 123 (H) 80 64 (L) 101 (H)      Admitted with Hypoglycemia and Psychotic Episode.  History of DM, MI, HTN.  Home DM Meds: Levemir 14 units daily + Novolog 3-12 units tid per SSI   **Note Lantus 6 units QHS started last PM.  Patient's CBGs have been very labile since admission.  **Current Orders= Lantus 6 units QHS + Novolog Sensitive SSI    Will follow. Wyn Quaker RN, MSN, CDE Diabetes Coordinator Inpatient Diabetes Program Team Pager: [phone] (8a-10p)

## 2014-01-05 NOTE — Progress Notes (Signed)
INITIAL NUTRITION ASSESSMENT  Patient meets the criteria for severe MALNUTRITION in the context of chronic illness with 15% weight loss in 7 months and moderate-severe muscle and fat depletion.   DOCUMENTATION CODES Per approved criteria  -Severe malnutrition in the context of chronic illness   INTERVENTION: -Continue Ensure Complete po BID, each supplement provides 350 kcal and 13 grams of protein -Encourage adequate PO intake.   NUTRITION DIAGNOSIS: Inadequate oral intake related to decreased appetite as evidenced by 50% meal intake.   Goal: Patient will meet >/=90% of estimated nutrition needs  Monitor:  PO intake, weight, labs, I/Os  Reason for Assessment: Malnutrition screening tool  73 y.o. female  Admitting Dx: Acute encephalopathy  ASSESSMENT: 73 y.o. female with prior h/o diabetes mellitus, asthma, was in ED for psychotic episode/ auditory and visual hallucinations, was scheduled to be transferred to [facility] for inpatient hospitalization, when she developed hypoglycemic episodes on dextrose fluids. She also reports generalized weakness.   Patient with fair appetite. She is eating about 50% of her meals. She has Ensure Complete ordered BID.   Patient has lost about 20 pounds (15% weight loss) over the last 7 months, which is significant over the time frame. Limited physical exam performed. Patient with moderate-severe muscle and fat depletion in the clavicle, temples, shoulder, and upper arm.   Height: Ht Readings from Last 1 Encounters:  01/02/14 4\' 11"  (1.499 m)    Weight: Wt Readings from Last 1 Encounters:  01/02/14 110 lb (49.896 kg)    Ideal Body Weight: 95 pounds  % Ideal Body Weight: 116%  Wt Readings from Last 10 Encounters:  01/02/14 110 lb (49.896 kg)  11/17/13 116 lb (52.617 kg)  10/06/13 125 lb 3.2 oz (56.79 kg)  08/26/13 124 lb 3.2 oz (56.337 kg)  07/09/13 122 lb 4.8 oz (55.475 kg)  06/02/13 130 lb 6.4 oz (59.149 kg)  04/05/13  131 lb 12.8 oz (59.784 kg)  03/17/13 133 lb 4.8 oz (60.464 kg)  03/08/13 129 lb 12.8 oz (58.877 kg)  02/25/13 130 lb (58.968 kg)    Usual Body Weight: 130 pounds  % Usual Body Weight: 85%  BMI:  Body mass index is 22.21 kg/(m^2). Patient is normal weight  Estimated Nutritional Needs: Kcal: 1200-1400 kcal Protein: 60-70 g Fluid: >1.4 L/day  Skin: Stage 1 and 2 pressure ulcers buttocks  Diet Order: Carb Control  EDUCATION NEEDS: -No education needs identified at this time   Intake/Output Summary (Last 24 hours) at 01/05/14 1539 Last data filed at 01/05/14 1300  Gross per 24 hour  Intake   2720 ml  Output      0 ml  Net   2720 ml    Last BM: 9/22   Labs:   Recent Labs Lab 01/02/14 0157 01/04/14 1312 01/05/14 1150  NA 140 133* 129*  K 4.6 4.8 5.3  CL 99 92* 90*  CO2 31 28 27   BUN 15 19 23   CREATININE 1.09 1.11* 1.38*  CALCIUM 8.7 8.1* 8.0*  GLUCOSE 229* 269* 335*    CBG (last 3)   Recent Labs  01/05/14 0749 01/05/14 0818 01/05/14 1142  GLUCAP 64* 101* 332*    Scheduled Meds: . antiseptic oral rinse  7 mL Mouth Rinse BID  . divalproex  125 mg Oral Q12H  . enoxaparin (LOVENOX) injection  30 mg Subcutaneous Q24H  . feeding supplement (ENSURE COMPLETE)  237 mL Oral BID BM  . insulin aspart  0-9 Units Subcutaneous TID WC  . insulin  detemir  6 Units Subcutaneous QHS  . lisinopril  20 mg Oral Daily  . LORazepam  0.5 mg Oral QHS  . metoprolol succinate  100 mg Oral Daily  . pantoprazole  40 mg Oral Daily  . predniSONE  10 mg Oral Daily  . risperiDONE  0.5 mg Oral BH-q7a  . risperiDONE  1 mg Oral QHS  . sertraline  50 mg Oral Daily  . traMADol  50 mg Oral Q12H    Continuous Infusions:   Past Medical History  Diagnosis Date  . Diabetic ketoacidosis 11/2010  . Myocardial infarction     Chest pain s/p normal cath in 08/2004 then NSTEMI during 11/2010 admission, negative Myoview  . Diabetes mellitus type 2, insulin dependent     initial diagnoses  11/2008  . GERD (gastroesophageal reflux disease)   . Esophageal stricture 06/2004    Dilation 06/2004  . Hypertension   . Supraventricular tachycardia   . history of  pericarditis 12/2002  . Pericardial effusion   . Rheumatoid arthritis(714.0)     On MTX and chronic steroids  . Osteoarthritis   . Asthma     Home 3L O2   . Seizure 11/2008  . Rectal bleeding 11/2010    Large hemorrhoids  . Chronic anemia   . History of blood transfusion   . Diverticulosis   . Anemia   . Family history of anesthesia complication     DAUGHTER HAS NAUSEA    Past Surgical History  Procedure Laterality Date  . Cholecystectomy    . Abdominal hysterectomy    . Incise and drain abcess  09/2011    I&D of peri-rectal abcess per Dr Zella Richer.   . Colonoscopy N/A 01/06/2013    Procedure: COLONOSCOPY;  Surgeon: Ladene Artist, MD;  Location: [facility] ENDOSCOPY;  Service: Endoscopy;  Laterality: N/A;  . Esophagogastroduodenoscopy N/A 02/11/2013    Procedure: ESOPHAGOGASTRODUODENOSCOPY (EGD);  Surgeon: Irene Shipper, MD;  Location: [facility] ENDOSCOPY;  Service: Endoscopy;  Laterality: N/A;    Larey Seat, RD, LDN Pager #: [phone] After-Hours Pager #: [phone]

## 2014-01-05 NOTE — Progress Notes (Signed)
Notified Baltazar Najjar, NP via text page that pt's CBG was 305. Will continue to monitor pt. Ranelle Oyster, RN

## 2014-01-05 NOTE — Evaluation (Signed)
Physical Therapy Evaluation Patient Details Name: Marisa [name] MRN: [id number] DOB: 10-11-1940 Today's Date: 01/05/2014   History of Present Illness  Patient is a 73 yo female admitted with Hypoglycemia and acute encephalopathy.    Clinical Impression  Patient demonstrates deficits in functional mobility as indicated below. Will need continued skilled PT to address deficits and maximize function. Will see as indicated and progress as tolerated.     Follow Up Recommendations No PT follow up;Supervision/Assistance - 24 hour    Equipment Recommendations  None recommended by PT    Recommendations for Other Services       Precautions / Restrictions Precautions Precautions: Fall (Pysch, hallucinations, ) Restrictions Weight Bearing Restrictions: No      Mobility  Bed Mobility Overal bed mobility: Modified Independent             General bed mobility comments: increased time to perform, no physical assist required  Transfers Overall transfer level: Needs assistance Equipment used: Straight cane Transfers: Sit to/from Stand Sit to Stand: Min assist         General transfer comment: assist for stability  Ambulation/Gait Ambulation/Gait assistance: Min guard Ambulation Distance (Feet): 40 Feet Assistive device: Straight cane Gait Pattern/deviations: Decreased stride length;Trendelenburg;Shuffle Gait velocity: decreased Gait velocity interpretation: <1.8 ft/sec, indicative of risk for recurrent falls    Stairs            Wheelchair Mobility    Modified Rankin (Stroke Patients Only)       Balance Overall balance assessment: Needs assistance         Standing balance support: Single extremity supported;During functional activity Standing balance-Leahy Scale: Fair Standing balance comment: min guard for static standing                             Pertinent Vitals/Pain Pain Assessment: No/denies pain    Home Living Family/patient  expects to be discharged to:: Private residence Living Arrangements: Spouse/significant other Available Help at Discharge: Family Type of Home: House Home Access: Stairs to enter Entrance Stairs-Rails: Psychiatric nurse of Steps: 6 Home Layout: One level Home Equipment: Environmental consultant - 2 wheels;Cane - single point      Prior Function Level of Independence: Independent with assistive device(s)         Comments: used a single point cane and home oxygen 2.5 liters     Hand Dominance   Dominant Hand: Right    Extremity/Trunk Assessment   Upper Extremity Assessment: Generalized weakness           Lower Extremity Assessment: Generalized weakness;LLE deficits/detail   LLE Deficits / Details:  (LLE 3/5 compared to RLE 4/5)     Communication   Communication: No difficulties  Cognition Arousal/Alertness: Awake/alert Behavior During Therapy: WFL for tasks assessed/performed Overall Cognitive Status: Within Functional Limits for tasks assessed                      General Comments      Exercises General Exercises - Lower Extremity Long Arc Quad: AROM;Both;5 reps Straight Leg Raises: AROM;Both;5 reps Hip Flexion/Marching: AROM;Both;5 reps (using windowsill for support) Mini-Sqauts: AROM;Both;5 reps (using windowsill for support)      Assessment/Plan    PT Assessment Patient needs continued PT services  PT Diagnosis Difficulty walking;Generalized weakness   PT Problem List Decreased strength;Decreased range of motion;Decreased activity tolerance;Decreased mobility  PT Treatment Interventions DME instruction;Gait training;Stair training;Functional mobility training;Therapeutic activities;Therapeutic exercise;Balance training;Patient/family  education   PT Goals (Current goals can be found in the Care Plan section) Acute Rehab PT Goals Patient Stated Goal:  (to get stronger) PT Goal Formulation: With patient/family Time For Goal Achievement:  01/19/14 Potential to Achieve Goals: Good    Frequency Min 2X/week   Barriers to discharge        Co-evaluation               End of Session Equipment Utilized During Treatment: Gait belt;Oxygen Activity Tolerance: Patient tolerated treatment well Patient left: in bed;with call bell/phone within reach;with nursing/sitter in room;with family/visitor present Nurse Communication: Mobility status         Time: 1425-1450 PT Time Calculation (min): 25 min   Charges:   PT Evaluation $Initial PT Evaluation Tier I: 1 Procedure PT Treatments $Therapeutic Activity: 23-37 mins   PT G CodesDuncan Dull 01/05/2014, 3:19 PM Alben Deeds, [place] DPT  [phone]

## 2014-01-05 NOTE — Progress Notes (Signed)
PATIENT DETAILS Name: Marisa [name] Age: 73 y.o. Sex: female Date of Birth: 1941-03-09 Admit Date: 01/02/2014 Admitting Physician Hosie Poisson, MD VVO:[id number], MD  Subjective: Awake, alert-denies any major complaints.  Assessment/Plan: Active Problems:   Hypoglycemia - Secondary to poor oral intake. Suspect she has very brittle diabetes. - Seems to have mostly resolved, will need to mild permissive hyper glycemia and avoid tight glycemic control.  Diabetes type 2 - Suspect has  brittle diabetes.will need to mild permissive hyper glycemia and avoid tight glycemic control. - Change Levemir to 8 units twice a day, continue SSI.  Psychosis - Initially brought to the emergency room for hallucinations-seen by psychiatry-recommended inpatient psychiatric evaluation. Will inform psychiatric regarding patient's admission - Continue with risperidone, Zoloft and Depakote  Hypertension - Stable with lisinopril, metoprolol  History of rheumatoid arthritis - Stable-on chronic steroids  History of anemia - Chronic issue, currently stable. Monitor periodically.  History of coronary disease - Currently stable.  History of bronchial asthma - Lungs clear, currently stable.  Disposition: Remain inpatient  DVT Prophylaxis: Prophylactic Lovenox or Heparin or SCD's  Code Status: Full code   Family Communication Spouse  Procedures:  None  CONSULTS:  psychiatry  Time spent 40 minutes-which includes 50% of the time with face-to-face with patient/ family and coordinating care related to the above assessment and plan.    MEDICATIONS: Scheduled Meds: . antiseptic oral rinse  7 mL Mouth Rinse BID  . divalproex  125 mg Oral Q12H  . enoxaparin (LOVENOX) injection  30 mg Subcutaneous Q24H  . feeding supplement (ENSURE COMPLETE)  237 mL Oral BID BM  . insulin aspart  0-9 Units Subcutaneous TID WC  . insulin detemir  8 Units Subcutaneous BID  . lisinopril  20 mg  Oral Daily  . LORazepam  0.5 mg Oral QHS  . metoprolol succinate  100 mg Oral Daily  . pantoprazole  40 mg Oral Daily  . predniSONE  10 mg Oral Daily  . risperiDONE  0.5 mg Oral BH-q7a  . risperiDONE  1 mg Oral QHS  . sertraline  50 mg Oral Daily  . traMADol  50 mg Oral Q12H   Continuous Infusions:  PRN Meds:.acetaminophen, albuterol, ondansetron  Antibiotics: Anti-infectives   None       PHYSICAL EXAM: Vital signs in last 24 hours: Filed Vitals:   01/05/14 0853 01/05/14 1100 01/05/14 1355 01/05/14 1358  BP: 143/56 132/48 151/77   Pulse: 106 90 94 88  Temp:   98.9 F (37.2 C)   TempSrc:   Oral   Resp: 20  20   Height:      Weight:      SpO2: 100%  93% 100%    Weight change:  Filed Weights   01/02/14 1939  Weight: 49.896 kg (110 lb)   Body mass index is 22.21 kg/(m^2).   Gen Exam: Awake and alert with clear speech.   Neck: Supple, No JVD.   Chest: B/L Clear.   CVS: S1 S2 Regular, no murmurs.  Abdomen: soft, BS +, non tender, non distended.  Extremities: no edema, lower extremities warm to touch. Neurologic: Non Focal.   Skin: No Rash.   Wounds: N/A.    Intake/Output from previous day:  Intake/Output Summary (Last 24 hours) at 01/05/14 1549 Last data filed at 01/05/14 1300  Gross per 24 hour  Intake   2720 ml  Output      0 ml  Net  2720 ml     LAB RESULTS: CBC  Recent Labs Lab 01/02/14 0157 01/04/14 1312  WBC 7.2 7.8  HGB 8.5* 9.0*  HCT 26.9* 28.3*  PLT 244 259  MCV 88.2 88.4  MCH 27.9 28.1  MCHC 31.6 31.8  RDW 14.4 14.4  LYMPHSABS 0.5* 0.3*  MONOABS 1.1* 0.5  EOSABS 0.0 0.1  BASOSABS 0.0 0.0    Chemistries   Recent Labs Lab 01/02/14 0157 01/04/14 1312 01/05/14 1150  NA 140 133* 129*  K 4.6 4.8 5.3  CL 99 92* 90*  CO2 31 28 27   GLUCOSE 229* 269* 335*  BUN 15 19 23   CREATININE 1.09 1.11* 1.38*  CALCIUM 8.7 8.1* 8.0*    CBG:  Recent Labs Lab 01/05/14 0329 01/05/14 0612 01/05/14 0749 01/05/14 0818 01/05/14 1142    GLUCAP 123* 80 64* 101* 332*    GFR Estimated Creatinine Clearance: 25.1 ml/min (by C-G formula based on Cr of 1.38).  Coagulation profile No results found for this basename: INR, PROTIME,  in the last 168 hours  Cardiac Enzymes  Recent Labs Lab 01/02/14 0820  TROPONINI <0.30    No components found with this basename: POCBNP,  No results found for this basename: DDIMER,  in the last 72 hours  Recent Labs  01/04/14 1312  HGBA1C 6.4*   No results found for this basename: CHOL, HDL, LDLCALC, TRIG, CHOLHDL, LDLDIRECT,  in the last 72 hours No results found for this basename: TSH, T4TOTAL, FREET3, T3FREE, THYROIDAB,  in the last 72 hours  Recent Labs  01/02/14 1839  VITAMINB12 551  FOLATE >20.0    Recent Labs  01/04/14 1301  LIPASE 23    Urine Studies No results found for this basename: UACOL, UAPR, USPG, UPH, UTP, UGL, UKET, UBIL, UHGB, UNIT, UROB, ULEU, UEPI, UWBC, URBC, UBAC, CAST, CRYS, UCOM, BILUA,  in the last 72 hours  MICROBIOLOGY: No results found for this or any previous visit (from the past 240 hour(s)).  RADIOLOGY STUDIES/RESULTS: Mr Kizzie Fantasia Contrast  12/31/2013   CLINICAL DATA:  Confusion.  Delirium.  EXAM: MRI HEAD WITHOUT AND WITH CONTRAST  TECHNIQUE: Multiplanar, multiecho pulse sequences of the brain and surrounding structures were obtained without and with intravenous contrast.  CONTRAST:  5 mL MultiHance.  COMPARISON:  CT head without contrast 10/21/2013. MRI brain without and with contrast 12/07/2008.  FINDINGS: No acute infarct, hemorrhage, or mass lesion is present. Moderate periventricular and scattered subcortical white matter changes bilaterally are similar to the prior study. The ventricles are of normal size. Flow is present in the major intracranial arteries. No significant extra-axial fluid collection is present. Globes and orbits are intact. Midline structures are normal.  The postcontrast images demonstrate no pathologic enhancement.   IMPRESSION: 1. Stable age advanced white matter disease. This likely reflects the sequela of chronic microvascular ischemia. 2. No acute intracranial abnormality or significant interval change.   Electronically Signed   By: Lawrence Santiago M.D.   On: 12/31/2013 16:00   Ct Abdomen Pelvis W Contrast  01/04/2014   CLINICAL DATA:  Abdominal pain, diarrhea  EXAM: CT ABDOMEN AND PELVIS WITH CONTRAST  TECHNIQUE: Multidetector CT imaging of the abdomen and pelvis was performed using the standard protocol following bolus administration of intravenous contrast.  CONTRAST:  75mL OMNIPAQUE IOHEXOL 300 MG/ML  SOLN  COMPARISON:  06/06/2012  FINDINGS: The lung bases are clear. There is a right Bochdalek's hernia.  The liver demonstrates no focal abnormality. There is no intrahepatic or extrahepatic  biliary ductal dilatation. The gallbladder is surgically absent. There are innumerable calcifications within the spleen likely reflecting sequela of prior granulomatous disease.There is a stable hypodense, fluid attenuating 1.6 cm left renal mass most consistent with a cyst. The right kidney, adrenal glands and pancreas are normal. There is mild bilateral nonspecific perinephric stranding. The bladder is unremarkable.  The stomach, duodenum, small intestine, and large intestine demonstrate no contrast extravasation or dilatation. There is no pneumoperitoneum, pneumatosis, or portal venous gas. There is no abdominal or pelvic free fluid. There is no lymphadenopathy. The uterus is surgically absent. The ovaries are normal in size.  The abdominal aorta is normal in caliber with atherosclerosis.  There are no lytic or sclerotic osseous lesions. There is a chronic L1 mild compression fracture. There is diffuse lumbar spine spondylosis unchanged in the prior exam. There are chronic bilateral superior and inferior pubic rami fractures. There are bilateral pole of rib fractures. There is an old left L3 transverse process fracture.   IMPRESSION: 1. No acute abdominal or pelvic pathology. 2. Lumbar spine spondylosis.   Electronically Signed   By: Kathreen Devoid   On: 01/04/2014 16:59   Dg Chest Port 1 View  01/04/2014   CLINICAL DATA:  Dyspnea, weakness  EXAM: PORTABLE CHEST - 1 VIEW  COMPARISON:  10/04/2011  FINDINGS: Cardiomediastinal silhouette is stable. No acute infiltrate or pleural effusion. No pulmonary edema. Old right rib fracture is noted.  IMPRESSION: No active disease.   Electronically Signed   By: Lahoma Crocker M.D.   On: 01/04/2014 13:40    Oren Binet, MD  [facility] Pager:[phone]  If 7PM-7AM, please contact night-coverage [email or web address] Password [email or web address] 01/05/2014, 3:49 PM   LOS: 3 days   **Disclaimer: This note may have been dictated with voice recognition software. Similar sounding words can inadvertently be transcribed and this note may contain transcription errors which may not have been corrected upon publication of note.**

## 2014-01-05 NOTE — Care Management Note (Addendum)
    Page 1 of 2   01/12/2014     3:33:49 PM CARE MANAGEMENT NOTE 01/12/2014  Patient:  Marisa [name], Marisa [name]   Account Number:  [id number]  Date Initiated:  01/05/2014  Documentation initiated by:  Tomi Bamberger  Subjective/Objective Assessment:   dx hypoglycemia,  admit- lives with spouse.     Action/Plan:   pt eval-  psych consult.  9/29- patient cleared by psych   Anticipated DC Date:  01/12/2014   Anticipated DC Plan:  [place] referral  Clinical Social Worker      DC Forensic scientist  CM consult      [facility] Choice  HOME HEALTH   Choice offered to / List presented to:  C-1 Patient        [place] arranged  HH-1 RN  [place] OT      [place].   Status of service:  Completed, signed off Medicare Important Message given?  YES (If response is "NO", the following Medicare IM given date fields will be blank) Date Medicare IM given:  01/05/2014 Medicare IM given by:  Tomi Bamberger Date Additional Medicare IM given:  01/10/2014 Additional Medicare IM given by:  Tomi Bamberger  Discharge Disposition:  [place]  Per UR Regulation:  Reviewed for med. necessity/level of care/duration of stay  If discussed at [place] of Stay Meetings, dates discussed:    Comments:  01/11/14 [place], BSN [phone] patient told rep with [facility] to leave, patient decided not to go to [facility].  NCM went in to talk to patient and she has decided to go home with [place], PT,OT and Education officer, museum. Patient chose [facility], referral made to  [facility], Butch Penny notified.  Soc will begin 24-48 hrs post dc.  01/07/14 [place], BSN [phone] awaiting psych inpatient bed. Psych CSW aware.  01/05/14 [place], BSN [phone] psych consulted .  Patient is from psych facility.

## 2014-01-06 ENCOUNTER — Inpatient Hospital Stay (HOSPITAL_COMMUNITY): Payer: Medicare Other

## 2014-01-06 DIAGNOSIS — I10 Essential (primary) hypertension: Secondary | ICD-10-CM

## 2014-01-06 DIAGNOSIS — M069 Rheumatoid arthritis, unspecified: Secondary | ICD-10-CM

## 2014-01-06 LAB — CBC WITH DIFFERENTIAL/PLATELET
Basophils Absolute: 0 10*3/uL (ref 0.0–0.1)
Basophils Relative: 1 % (ref 0–1)
EOS ABS: 0.2 10*3/uL (ref 0.0–0.7)
EOS PCT: 4 % (ref 0–5)
HCT: 25.3 % — ABNORMAL LOW (ref 36.0–46.0)
HEMOGLOBIN: 8.1 g/dL — AB (ref 12.0–15.0)
LYMPHS ABS: 0.6 10*3/uL — AB (ref 0.7–4.0)
Lymphocytes Relative: 11 % — ABNORMAL LOW (ref 12–46)
MCH: 28 pg (ref 26.0–34.0)
MCHC: 32 g/dL (ref 30.0–36.0)
MCV: 87.5 fL (ref 78.0–100.0)
MONO ABS: 0.8 10*3/uL (ref 0.1–1.0)
MONOS PCT: 15 % — AB (ref 3–12)
Neutro Abs: 3.6 10*3/uL (ref 1.7–7.7)
Neutrophils Relative %: 69 % (ref 43–77)
Platelets: 251 10*3/uL (ref 150–400)
RBC: 2.89 MIL/uL — AB (ref 3.87–5.11)
RDW: 14.1 % (ref 11.5–15.5)
WBC: 5.2 10*3/uL (ref 4.0–10.5)

## 2014-01-06 LAB — BLOOD GAS, ARTERIAL
ACID-BASE EXCESS: 0.6 mmol/L (ref 0.0–2.0)
Bicarbonate: 25.6 mEq/L — ABNORMAL HIGH (ref 20.0–24.0)
DRAWN BY: 305991
O2 CONTENT: 2 L/min
O2 Saturation: 97.9 %
PATIENT TEMPERATURE: 98.6
TCO2: 27.1 mmol/L (ref 0–100)
pCO2 arterial: 48.5 mmHg — ABNORMAL HIGH (ref 35.0–45.0)
pH, Arterial: 7.343 — ABNORMAL LOW (ref 7.350–7.450)
pO2, Arterial: 117 mmHg — ABNORMAL HIGH (ref 80.0–100.0)

## 2014-01-06 LAB — BASIC METABOLIC PANEL
Anion gap: 15 (ref 5–15)
BUN: 31 mg/dL — AB (ref 6–23)
CHLORIDE: 93 meq/L — AB (ref 96–112)
CO2: 27 mEq/L (ref 19–32)
Calcium: 8.4 mg/dL (ref 8.4–10.5)
Creatinine, Ser: 1.58 mg/dL — ABNORMAL HIGH (ref 0.50–1.10)
GFR calc Af Amer: 37 mL/min — ABNORMAL LOW (ref >90)
GFR, EST NON AFRICAN AMERICAN: 32 mL/min — AB (ref >90)
GLUCOSE: 176 mg/dL — AB (ref 70–99)
POTASSIUM: 4.3 meq/L (ref 3.7–5.3)
Sodium: 135 mEq/L — ABNORMAL LOW (ref 137–147)

## 2014-01-06 LAB — URINE CULTURE
COLONY COUNT: NO GROWTH
Culture: NO GROWTH

## 2014-01-06 LAB — GLUCOSE, CAPILLARY
GLUCOSE-CAPILLARY: 178 mg/dL — AB (ref 70–99)
GLUCOSE-CAPILLARY: 404 mg/dL — AB (ref 70–99)
GLUCOSE-CAPILLARY: 75 mg/dL (ref 70–99)
Glucose-Capillary: 429 mg/dL — ABNORMAL HIGH (ref 70–99)
Glucose-Capillary: 306 mg/dL — ABNORMAL HIGH (ref 70–99)
Glucose-Capillary: 170 mg/dL — ABNORMAL HIGH (ref 70–99)
Glucose-Capillary: 114 mg/dL — ABNORMAL HIGH (ref 70–99)
Glucose-Capillary: 47 mg/dL — ABNORMAL LOW (ref 70–99)
Glucose-Capillary: 70 mg/dL (ref 70–99)

## 2014-01-06 LAB — VALPROIC ACID LEVEL: Valproic Acid Lvl: 12.7 ug/mL — ABNORMAL LOW (ref 50.0–100.0)

## 2014-01-06 MED ORDER — INSULIN DETEMIR 100 UNIT/ML ~~LOC~~ SOLN: 5.0000 [IU] | Freq: Two times a day (BID) | SUBCUTANEOUS | Status: DC

## 2014-01-06 MED ORDER — SODIUM CHLORIDE 0.9 % IV SOLN
INTRAVENOUS | Status: DC
  Administered 2014-01-06: 15:00:00 via INTRAVENOUS

## 2014-01-06 MED ORDER — INSULIN DETEMIR 100 UNIT/ML ~~LOC~~ SOLN: 8.0000 [IU] | Freq: Every day | SUBCUTANEOUS | Status: DC

## 2014-01-06 MED ORDER — INSULIN ASPART 100 UNIT/ML ~~LOC~~ SOLN
10.0000 [IU] | Freq: Once | SUBCUTANEOUS | Status: AC
  Administered 2014-01-06: 10 [IU] via SUBCUTANEOUS

## 2014-01-06 MED ORDER — INSULIN DETEMIR 100 UNIT/ML ~~LOC~~ SOLN: 6.0000 [IU] | Freq: Two times a day (BID) | SUBCUTANEOUS | Status: DC

## 2014-01-06 MED ORDER — INSULIN DETEMIR 100 UNIT/ML ~~LOC~~ SOLN
12.0000 [IU] | Freq: Every day | SUBCUTANEOUS | Status: DC
  Filled 2014-01-06: qty 0.12

## 2014-01-06 MED ORDER — DULOXETINE HCL 30 MG PO CPEP
30.0000 mg | ORAL_CAPSULE | Freq: Every day | ORAL | Status: DC
  Administered 2014-01-06 – 2014-01-12 (×7): 30 mg via ORAL
  Filled 2014-01-06 (×7): qty 1

## 2014-01-06 MED ORDER — RISPERIDONE 0.25 MG PO TABS
0.2500 mg | ORAL_TABLET | Freq: Two times a day (BID) | ORAL | Status: DC
  Administered 2014-01-06 – 2014-01-12 (×12): 0.25 mg via ORAL
  Filled 2014-01-06 (×13): qty 1

## 2014-01-06 MED ORDER — INSULIN DETEMIR 100 UNIT/ML ~~LOC~~ SOLN
10.0000 [IU] | Freq: Every day | SUBCUTANEOUS | Status: DC
  Administered 2014-01-06: 10 [IU] via SUBCUTANEOUS
  Filled 2014-01-06: qty 0.1

## 2014-01-06 NOTE — Progress Notes (Addendum)
PATIENT DETAILS Name: Marisa [name] Age: 73 y.o. Sex: female Date of Birth: 1940-09-14 Admit Date: 01/02/2014 Admitting Physician Hosie Poisson, MD NTI:[id number], MD  Subjective: Very lethargic earlier-but slowly improved, finally more awake and alert later this am. Answers all questions appropriately, moves all 4 ext  Assessment/Plan: Active Problems:   Hypoglycemia - Secondary to poor oral intake. 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  Acute Encephalopathy -lethargic this am-suspect related to medications/polypharmacy. Much improved, as morning has progressed. CT head neg. ABG without significant hypercarbia. Since much improved,and much more awake-monitor in floor for now. Has sitter in place  Diabetes type 2 - Suspect has  brittle diabetes.will need to mild permissive hyper glycemia and avoid tight glycemic control. - Change Levemir to 10 units in am, and 12 units in pm, continue SSI.  Psychosis - Initially brought to the emergency room for hallucinations-seen by psychiatry-recommended inpatient psychiatric evaluation. -very lethargic this am-CT head neg, ABG without significant hypercarbia. Spoke with cards, stopped Depakote, changed Risperidal to 0.25 mg BID. Zoloft changed to Cymbalta  ARF -mild increase in creatinine-stop Lisinopril, gently hydrate, recheck lytes in am  Hypertension - Stable with metoprolol  History of rheumatoid arthritis - Stable-on chronic steroids  History of anemia - Chronic issue, currently stable. Monitor periodically.  History of coronary disease - Currently stable.  History of bronchial asthma - Lungs clear, currently stable.  Disposition: Remain inpatient  DVT Prophylaxis: Prophylactic Lovenox  Code Status: Full code   Family  Communication Spouse  Procedures:  None  CONSULTS:  psychiatry  MEDICATIONS: Scheduled Meds: . antiseptic oral rinse  7 mL Mouth Rinse BID  . DULoxetine  30 mg Oral Daily  . enoxaparin (LOVENOX) injection  30 mg Subcutaneous Q24H  . feeding supplement (ENSURE COMPLETE)  237 mL Oral BID BM  . insulin aspart  0-9 Units Subcutaneous TID WC  . insulin detemir  10 Units Subcutaneous Daily  . insulin detemir  12 Units Subcutaneous QHS  . LORazepam  0.5 mg Oral QHS  . metoprolol succinate  100 mg Oral Daily  . pantoprazole  40 mg Oral Daily  . predniSONE  10 mg Oral Daily  . risperiDONE  0.25 mg Oral BID   Continuous Infusions:  PRN Meds:.acetaminophen, albuterol, hydrALAZINE, ondansetron  Antibiotics: Anti-infectives   None       PHYSICAL EXAM: Vital signs in last 24 hours: Filed Vitals:   01/06/14 0548 01/06/14 0956 01/06/14 1123 01/06/14 1329  BP: 172/53 120/47 143/67 127/42  Pulse: 91 87 91 78  Temp: 98.3 F (36.8 C) 97.9 F (36.6 C)  97.6 F (36.4 C)  TempSrc: Oral Oral  Oral  Resp: 18 17  18   Height:      Weight:      SpO2: 100% 99%  99%    Weight change:  Filed Weights   01/02/14 1939  Weight: 49.896 kg (110 lb)   Body mass index is 22.21 kg/(m^2).   Gen Exam: Awake but Lethargic-answers all questions appropriately Neck: Supple, No JVD.   Chest: B/L Clear.   CVS: S1 S2 Regular, no murmurs.  Abdomen: soft, BS +, non tender, non distended.  Extremities: no edema, lower extremities warm to touch. Neurologic: Non Focal.   Skin: No Rash.   Wounds: N/A.    Intake/Output from previous day:  Intake/Output  Summary (Last 24 hours) at 01/06/14 1337 Last data filed at 01/05/14 1824  Gross per 24 hour  Intake    120 ml  Output      0 ml  Net    120 ml     LAB RESULTS: CBC  Recent Labs Lab 01/02/14 0157 01/04/14 1312 01/06/14 1110  WBC 7.2 7.8 5.2  HGB 8.5* 9.0* 8.1*  HCT 26.9* 28.3* 25.3*  PLT 244 259 251  MCV 88.2 88.4 87.5  MCH 27.9  28.1 28.0  MCHC 31.6 31.8 32.0  RDW 14.4 14.4 14.1  LYMPHSABS 0.5* 0.3* 0.6*  MONOABS 1.1* 0.5 0.8  EOSABS 0.0 0.1 0.2  BASOSABS 0.0 0.0 0.0    Chemistries   Recent Labs Lab 01/02/14 0157 01/04/14 1312 01/05/14 1150 01/06/14 1110  NA 140 133* 129* 135*  K 4.6 4.8 5.3 4.3  CL 99 92* 90* 93*  CO2 31 28 27 27   GLUCOSE 229* 269* 335* 176*  BUN 15 19 23  31*  CREATININE 1.09 1.11* 1.38* 1.58*  CALCIUM 8.7 8.1* 8.0* 8.4    CBG:  Recent Labs Lab 01/05/14 2101 01/06/14 0705 01/06/14 0754 01/06/14 0954 01/06/14 1143  GLUCAP 83 429* 404* 306* 170*    GFR Estimated Creatinine Clearance: 21.9 ml/min (by C-G formula based on Cr of 1.58).  Coagulation profile No results found for this basename: INR, PROTIME,  in the last 168 hours  Cardiac Enzymes  Recent Labs Lab 01/02/14 0820  TROPONINI <0.30    No components found with this basename: POCBNP,  No results found for this basename: DDIMER,  in the last 72 hours  Recent Labs  01/04/14 1312  HGBA1C 6.4*   No results found for this basename: CHOL, HDL, LDLCALC, TRIG, CHOLHDL, LDLDIRECT,  in the last 72 hours No results found for this basename: TSH, T4TOTAL, FREET3, T3FREE, THYROIDAB,  in the last 72 hours No results found for this basename: VITAMINB12, FOLATE, FERRITIN, TIBC, IRON, RETICCTPCT,  in the last 72 hours  Recent Labs  01/04/14 1301  LIPASE 23    Urine Studies No results found for this basename: UACOL, UAPR, USPG, UPH, UTP, UGL, UKET, UBIL, UHGB, UNIT, UROB, ULEU, UEPI, UWBC, URBC, UBAC, CAST, CRYS, UCOM, BILUA,  in the last 72 hours  MICROBIOLOGY: No results found for this or any previous visit (from the past 240 hour(s)).  RADIOLOGY STUDIES/RESULTS: Mr Kizzie Fantasia Contrast  12/31/2013   CLINICAL DATA:  Confusion.  Delirium.  EXAM: MRI HEAD WITHOUT AND WITH CONTRAST  TECHNIQUE: Multiplanar, multiecho pulse sequences of the brain and surrounding structures were obtained without and with intravenous  contrast.  CONTRAST:  5 mL MultiHance.  COMPARISON:  CT head without contrast 10/21/2013. MRI brain without and with contrast 12/07/2008.  FINDINGS: No acute infarct, hemorrhage, or mass lesion is present. Moderate periventricular and scattered subcortical white matter changes bilaterally are similar to the prior study. The ventricles are of normal size. Flow is present in the major intracranial arteries. No significant extra-axial fluid collection is present. Globes and orbits are intact. Midline structures are normal.  The postcontrast images demonstrate no pathologic enhancement.  IMPRESSION: 1. Stable age advanced white matter disease. This likely reflects the sequela of chronic microvascular ischemia. 2. No acute intracranial abnormality or significant interval change.   Electronically Signed   By: Lawrence Santiago M.D.   On: 12/31/2013 16:00   Ct Abdomen Pelvis W Contrast  01/04/2014   CLINICAL DATA:  Abdominal pain, diarrhea  EXAM: CT ABDOMEN  AND PELVIS WITH CONTRAST  TECHNIQUE: Multidetector CT imaging of the abdomen and pelvis was performed using the standard protocol following bolus administration of intravenous contrast.  CONTRAST:  68mL OMNIPAQUE IOHEXOL 300 MG/ML  SOLN  COMPARISON:  06/06/2012  FINDINGS: The lung bases are clear. There is a right Bochdalek's hernia.  The liver demonstrates no focal abnormality. There is no intrahepatic or extrahepatic biliary ductal dilatation. The gallbladder is surgically absent. There are innumerable calcifications within the spleen likely reflecting sequela of prior granulomatous disease.There is a stable hypodense, fluid attenuating 1.6 cm left renal mass most consistent with a cyst. The right kidney, adrenal glands and pancreas are normal. There is mild bilateral nonspecific perinephric stranding. The bladder is unremarkable.  The stomach, duodenum, small intestine, and large intestine demonstrate no contrast extravasation or dilatation. There is no  pneumoperitoneum, pneumatosis, or portal venous gas. There is no abdominal or pelvic free fluid. There is no lymphadenopathy. The uterus is surgically absent. The ovaries are normal in size.  The abdominal aorta is normal in caliber with atherosclerosis.  There are no lytic or sclerotic osseous lesions. There is a chronic L1 mild compression fracture. There is diffuse lumbar spine spondylosis unchanged in the prior exam. There are chronic bilateral superior and inferior pubic rami fractures. There are bilateral pole of rib fractures. There is an old left L3 transverse process fracture.  IMPRESSION: 1. No acute abdominal or pelvic pathology. 2. Lumbar spine spondylosis.   Electronically Signed   By: Kathreen Devoid   On: 01/04/2014 16:59   Dg Chest Port 1 View  01/04/2014   CLINICAL DATA:  Dyspnea, weakness  EXAM: PORTABLE CHEST - 1 VIEW  COMPARISON:  10/04/2011  FINDINGS: Cardiomediastinal silhouette is stable. No acute infiltrate or pleural effusion. No pulmonary edema. Old right rib fracture is noted.  IMPRESSION: No active disease.   Electronically Signed   By: Lahoma Crocker M.D.   On: 01/04/2014 13:40    Oren Binet, MD  [facility] Pager:[phone]  If 7PM-7AM, please contact night-coverage [email or web address] Password [email or web address] 01/06/2014, 1:37 PM   LOS: 4 days   **Disclaimer: This note may have been dictated with voice recognition software. Similar sounding words can inadvertently be transcribed and this note may contain transcription errors which may not have been corrected upon publication of note.**

## 2014-01-06 NOTE — Progress Notes (Signed)
On call physician paged for pt's BS 70 - still admin hs levemir? Pt A&Ox4. Snack given to pt - will reassess BS.

## 2014-01-06 NOTE — Progress Notes (Signed)
Spoke with patient husband and found out that patient goes to bed at 3am-4am and wakes up at 12pm everyday.

## 2014-01-06 NOTE — Clinical Social Work Psych Note (Signed)
Psych CSW spoke with Stanton Kidney at [facility] to confirm that psychiatry consult was placed and pt was on the list to be evaluated today.  RNCM and unit CSW updated.  Nonnie Done, [place] [phone]  Psychiatric & Orthopedics (5N 1-16) Clinical Social Worker

## 2014-01-06 NOTE — Progress Notes (Signed)
Tech called RN into room. Patient found laid back in the chair looking clammy. CBG was taken- 429. MD will be made aware.

## 2014-01-06 NOTE — Progress Notes (Signed)
Hypoglycemic Event  CBG: 47  Treatment: 15 GM carbohydrate snack  Symptoms: Sweaty  Follow-up CBG: Time:1552 CBG Result:114  Possible Reasons for Event: Unknown  Comments/MD notified:MD notified recheck at 1815 and CBG was 178    Marisa [name]  Remember to initiate Hypoglycemia Order Set & complete

## 2014-01-06 NOTE — Consult Note (Signed)
Psychiatry Consult follow up  Reason for Consult:  Psychosis and depression Referring Physician:  Dr. Cari Caraway is an 73 y.o. female. Total Time spent with patient: 30 minutes  Assessment: AXIS I:  Psychosis NOS. Rule out steroid induced psychosis AXIS II:  Deferred AXIS III:   Past Medical History  Diagnosis Date  . Diabetic ketoacidosis 11/2010  . Myocardial infarction     Chest pain s/p normal cath in 08/2004 then NSTEMI during 11/2010 admission, negative Myoview  . Diabetes mellitus type 2, insulin dependent     initial diagnoses 11/2008  . GERD (gastroesophageal reflux disease)   . Esophageal stricture 06/2004    Dilation 06/2004  . Hypertension   . Supraventricular tachycardia   . history of  pericarditis 12/2002  . Pericardial effusion   . Rheumatoid arthritis(714.0)     On MTX and chronic steroids  . Osteoarthritis   . Asthma     Home 3L O2   . Seizure 11/2008  . Rectal bleeding 11/2010    Large hemorrhoids  . Chronic anemia   . History of blood transfusion   . Diverticulosis   . Anemia   . Family history of anesthesia complication     DAUGHTER HAS NAUSEA   AXIS IV:  Medical illnesses, to include being on O2 on an ongoing basis, and Arthritis AXIS V:  31-40 impairment in reality testing  Plan: Case discussed with Dr. Sloan Leiter and staff RN Discontinue Valproic acid due to sedation and decrease Risperidone 0.25 mg PO BID for psychosis Change zoloft to Cymbalta 30 mg POn Qam for better control of her depressive symptoms  Recommend psychiatric Inpatient admission when medically cleared. Supportive therapy provided about ongoing stressors. Refer to psych social service for inpatient psych pending.   Subjective:   Marisa [name] is a 73 y.o. female patient admitted with psychotic symptoms.  Patient is seen today for for psychiatric consultation follow up. Patient complained that she did not sleep well last night, this morning become more drowsy and  unable to respond to staff and felt like she is going to fall down and slumping in her chair. Patient stated that she is feeling depressed about her life in general and has on going visual and auditory hallucinations. Patient appeared lying in her bed, with increased psychomotor activity and depression along with anxiety. Reportedly she has better day yesterday. She is awake, able to verbally respond during this visit. She has low dose of valproic acid which is non therapeutic and has no evidence of seizure at this visit and will recommend to discontinue until further evaluation. Patient has been taking risperidone and zoloft without adverse effects including extrapyramidal symptoms but has sedation and poor responds to zoloft. Patient has depressed mood with severe neuro-vegetative symptoms. She denies homicidal or violent ideations. Patient has no past psychiatric history including out patient or in patient treatment. She has denies alcohol or drug abuse.  Past Psychiatric History: Past Medical History  Diagnosis Date  . Diabetic ketoacidosis 11/2010  . Myocardial infarction     Chest pain s/p normal cath in 08/2004 then NSTEMI during 11/2010 admission, negative Myoview  . Diabetes mellitus type 2, insulin dependent     initial diagnoses 11/2008  . GERD (gastroesophageal reflux disease)   . Esophageal stricture 06/2004    Dilation 06/2004  . Hypertension   . Supraventricular tachycardia   . history of  pericarditis 12/2002  . Pericardial effusion   . Rheumatoid arthritis(714.0)  On MTX and chronic steroids  . Osteoarthritis   . Asthma     Home 3L O2   . Seizure 11/2008  . Rectal bleeding 11/2010    Large hemorrhoids  . Chronic anemia   . History of blood transfusion   . Diverticulosis   . Anemia   . Family history of anesthesia complication     DAUGHTER HAS NAUSEA    reports that she has never smoked. She has never used smokeless tobacco. She reports that she does not drink alcohol or use  illicit drugs. Family History  Problem Relation Age of Onset  . Heart disease Mother   . Stroke Father      Living Arrangements: Spouse/significant other   Abuse/Neglect [facility]) Physical Abuse: Denies Verbal Abuse: Denies Sexual Abuse: Denies Allergies:   Allergies  Allergen Reactions  . Demerol [Meperidine] Other (See Comments)    Hallucinations  . Penicillins Swelling and Other (See Comments)    Throat swelling     Objective: Blood pressure 120/47, pulse 87, temperature 97.9 F (36.6 C), temperature source Oral, resp. rate 17, height $RemoveBe'4\' 11"'rwKYAWjAo$  (1.499 m), weight 49.896 kg (110 lb), SpO2 99.00%.Body mass index is 22.21 kg/(m^2). Results for orders placed during the hospital encounter of 01/02/14 (from the past 72 hour(s))  [facility] MONITORING, ED     Status: Abnormal   Collection Time    01/03/14 11:56 AM      Result Value Ref Range   Glucose-Capillary 164 (*) 70 - 99 mg/dL  [facility]IT[facility]ING, ED     Status: Abnormal   Collection Time    01/03/14  3:25 PM      Result Value Ref Range   Glucose-Capillary 159 (*) 70 - 99 mg/dL  [facility]IT[facility]ING, ED     Status: Abnormal   Collection Time    01/03/14  6:14 PM      Result Value Ref Range   Glucose-Capillary 293 (*) 70 - 99 mg/dL  [facility]IT[facility], ED     Status: Abnormal   Collection Time    01/03/14  7:05 PM      Result Value Ref Range   Glucose-Capillary 345 (*) 70 - 99 mg/dL  [facility]ING, ED     Status: Abnormal   Collection Time    01/03/14  8:23 PM      Result Value Ref Range   Glucose-Capillary 275 (*) 70 - 99 mg/dL  [facility]ITORING, ED     Status: Abnormal   Collection Time    01/04/14  1:24 AM      Result Value Ref Range   Glucose-Capillary 32 (*) 70 - 99 mg/dL  [facility]IT[facility], ED     Status: Abnormal   Collection Time    01/04/14  1:45 AM      Result Value Ref Range   Glucose-Capillary 148 (*) 70 - 99 mg/dL  [facility]ITORING, ED     Status: Abnormal   Collection Time    01/04/14  3:13 AM      Result Value Ref Range    Glucose-Capillary 202 (*) 70 - 99 mg/dL  [facility] MONITORING, ED     Status: Abnormal   Collection Time    01/04/14  7:22 AM      Result Value Ref Range   Glucose-Capillary 205 (*) 70 - 99 mg/dL  [facility]ITORING, ED     Status: Abnormal   Collection Time    01/04/14 10:06 AM      Result Value Ref Range  Glucose-Capillary 120 (*) 70 - 99 mg/dL  [facility] MONITORING, ED     Status: Abnormal   Collection Time    01/04/14 11:42 AM      Result Value Ref Range   Glucose-Capillary 69 (*) 70 - 99 mg/dL  URINALYSIS, ROUTINE W REFLEX MICROSCOPIC     Status: None   Collection Time    01/04/14 12:17 PM      Result Value Ref Range   Color, Urine YELLOW  YELLOW   APPearance CLEAR  CLEAR   Specific Gravity, Urine 1.008  1.005 - 1.030   pH 7.0  5.0 - 8.0   Glucose, UA NEGATIVE  NEGATIVE mg/dL   Hgb urine dipstick NEGATIVE  NEGATIVE   Bilirubin Urine NEGATIVE  NEGATIVE   Ketones, ur NEGATIVE  NEGATIVE mg/dL   Protein, ur NEGATIVE  NEGATIVE mg/dL   Urobilinogen, UA 0.2  0.0 - 1.0 mg/dL   Nitrite NEGATIVE  NEGATIVE   Leukocytes, UA NEGATIVE  NEGATIVE   Comment: MICROSCOPIC NOT DONE ON URINES WITH NEGATIVE PROTEIN, BLOOD, LEUKOCYTES, NITRITE, OR GLUCOSE <1000 mg/dL.  [facility] MONITORING, ED     Status: Abnormal   Collection Time    01/04/14 12:26 PM      Result Value Ref Range   Glucose-Capillary 148 (*) 70 - 99 mg/dL  VALPROIC ACID LEVEL     Status: Abnormal   Collection Time    01/04/14  1:01 PM      Result Value Ref Range   Valproic Acid Lvl 16.1 (*) 50.0 - 100.0 ug/mL  LIPASE, BLOOD     Status: None   Collection Time    01/04/14  1:01 PM      Result Value Ref Range   Lipase 23  11 - 59 U/L  CBC WITH DIFFERENTIAL     Status: Abnormal   Collection Time    01/04/14  1:12 PM      Result Value Ref Range   WBC 7.8  4.0 - 10.5 K/uL   RBC 3.20 (*) 3.87 - 5.11 MIL/uL   Hemoglobin 9.0 (*) 12.0 - 15.0 g/dL   HCT 28.3 (*) 36.0 - 46.0 %   MCV 88.4  78.0 - 100.0 fL   MCH 28.1  26.0 - 34.0 pg   MCHC  31.8  30.0 - 36.0 g/dL   RDW 14.4  11.5 - 15.5 %   Platelets 259  150 - 400 K/uL   Neutrophils Relative % 89 (*) 43 - 77 %   Neutro Abs 6.9  1.7 - 7.7 K/uL   Lymphocytes Relative 4 (*) 12 - 46 %   Lymphs Abs 0.3 (*) 0.7 - 4.0 K/uL   Monocytes Relative 6  3 - 12 %   Monocytes Absolute 0.5  0.1 - 1.0 K/uL   Eosinophils Relative 1  0 - 5 %   Eosinophils Absolute 0.1  0.0 - 0.7 K/uL   Basophils Relative 0  0 - 1 %   Basophils Absolute 0.0  0.0 - 0.1 K/uL  COMPREHENSIVE METABOLIC PANEL     Status: Abnormal   Collection Time    01/04/14  1:12 PM      Result Value Ref Range   Sodium 133 (*) 137 - 147 mEq/L   Potassium 4.8  3.7 - 5.3 mEq/L   Chloride 92 (*) 96 - 112 mEq/L   CO2 28  19 - 32 mEq/L   Glucose, Bld 269 (*) 70 - 99 mg/dL   BUN 19  6 -  23 mg/dL   Creatinine, Ser 1.11 (*) 0.50 - 1.10 mg/dL   Calcium 8.1 (*) 8.4 - 10.5 mg/dL   Total Protein 5.5 (*) 6.0 - 8.3 g/dL   Albumin 2.7 (*) 3.5 - 5.2 g/dL   AST 13  0 - 37 U/L   ALT 7  0 - 35 U/L   Alkaline Phosphatase 122 (*) 39 - 117 U/L   Total Bilirubin 0.3  0.3 - 1.2 mg/dL   GFR calc non Af Amer 48 (*) >90 mL/min   GFR calc Af Amer 56 (*) >90 mL/min   Comment: (NOTE)     The eGFR has been calculated using the CKD EPI equation.     This calculation has not been validated in all clinical situations.     eGFR's persistently <90 mL/min signify possible Chronic Kidney     Disease.   Anion gap 13  5 - 15  HEMOGLOBIN A1C     Status: Abnormal   Collection Time    01/04/14  1:12 PM      Result Value Ref Range   Hemoglobin A1C 6.4 (*) <5.7 %   Comment: (NOTE)                                                                               According to the ADA Clinical Practice Recommendations for 2011, when     HbA1c is used as a screening test:      >=6.5%   Diagnostic of Diabetes Mellitus               (if abnormal result is confirmed)     5.7-6.4%   Increased risk of developing Diabetes Mellitus     References:Diagnosis and  Classification of Diabetes Mellitus,Diabetes     HWEX,9371,69(CVELF 1):S62-S69 and Standards of Medical Care in             Diabetes - 2011,Diabetes Care,2011,34 (Suppl 1):S11-S61.   Mean Plasma Glucose 137 (*) <117 mg/dL   Comment: Performed at [facility] MONITORING, ED     Status: Abnormal   Collection Time    01/04/14  1:42 PM      Result Value Ref Range   Glucose-Capillary 251 (*) 70 - 99 mg/dL  [facility] MONITORING, ED     Status: Abnormal   Collection Time    01/04/14  3:56 PM      Result Value Ref Range   Glucose-Capillary 364 (*) 70 - 99 mg/dL  GLUCOSE, CAPILLARY     Status: Abnormal   Collection Time    01/04/14  5:21 PM      Result Value Ref Range   Glucose-Capillary 407 (*) 70 - 99 mg/dL  GLUCOSE, CAPILLARY     Status: Abnormal   Collection Time    01/04/14  7:07 PM      Result Value Ref Range   Glucose-Capillary 454 (*) 70 - 99 mg/dL  GLUCOSE, CAPILLARY     Status: Abnormal   Collection Time    01/04/14  8:00 PM      Result Value Ref Range   Glucose-Capillary 433 (*) 70 - 99 mg/dL   Comment 1 Notify RN  Comment 2 Documented in Chart    GLUCOSE, CAPILLARY     Status: Abnormal   Collection Time    01/04/14 10:04 PM      Result Value Ref Range   Glucose-Capillary 305 (*) 70 - 99 mg/dL   Comment 1 Notify RN     Comment 2 Documented in Chart    GLUCOSE, CAPILLARY     Status: Abnormal   Collection Time    01/05/14  1:48 AM      Result Value Ref Range   Glucose-Capillary 104 (*) 70 - 99 mg/dL  GLUCOSE, CAPILLARY     Status: Abnormal   Collection Time    01/05/14  3:29 AM      Result Value Ref Range   Glucose-Capillary 123 (*) 70 - 99 mg/dL  GLUCOSE, CAPILLARY     Status: None   Collection Time    01/05/14  6:12 AM      Result Value Ref Range   Glucose-Capillary 80  70 - 99 mg/dL   Comment 1 Documented in Chart     Comment 2 Notify RN    GLUCOSE, CAPILLARY     Status: Abnormal   Collection Time    01/05/14  7:49 AM      Result Value Ref Range    Glucose-Capillary 64 (*) 70 - 99 mg/dL  GLUCOSE, CAPILLARY     Status: Abnormal   Collection Time    01/05/14  8:18 AM      Result Value Ref Range   Glucose-Capillary 101 (*) 70 - 99 mg/dL  GLUCOSE, CAPILLARY     Status: Abnormal   Collection Time    01/05/14 11:42 AM      Result Value Ref Range   Glucose-Capillary 332 (*) 70 - 99 mg/dL  BASIC METABOLIC PANEL     Status: Abnormal   Collection Time    01/05/14 11:50 AM      Result Value Ref Range   Sodium 129 (*) 137 - 147 mEq/L   Potassium 5.3  3.7 - 5.3 mEq/L   Chloride 90 (*) 96 - 112 mEq/L   CO2 27  19 - 32 mEq/L   Glucose, Bld 335 (*) 70 - 99 mg/dL   BUN 23  6 - 23 mg/dL   Creatinine, Ser 9.20 (*) 0.50 - 1.10 mg/dL   Calcium 8.0 (*) 8.4 - 10.5 mg/dL   GFR calc non Af Amer 37 (*) >90 mL/min   GFR calc Af Amer 43 (*) >90 mL/min   Comment: (NOTE)     The eGFR has been calculated using the CKD EPI equation.     This calculation has not been validated in all clinical situations.     eGFR's persistently <90 mL/min signify possible Chronic Kidney     Disease.   Anion gap 12  5 - 15  GLUCOSE, CAPILLARY     Status: Abnormal   Collection Time    01/05/14  3:01 PM      Result Value Ref Range   Glucose-Capillary 371 (*) 70 - 99 mg/dL  GLUCOSE, CAPILLARY     Status: Abnormal   Collection Time    01/05/14  5:23 PM      Result Value Ref Range   Glucose-Capillary 278 (*) 70 - 99 mg/dL   Comment 1 Notify RN    GLUCOSE, CAPILLARY     Status: None   Collection Time    01/05/14  9:01 PM      Result Value Ref  Range   Glucose-Capillary 83  70 - 99 mg/dL  GLUCOSE, CAPILLARY     Status: Abnormal   Collection Time    01/06/14  7:05 AM      Result Value Ref Range   Glucose-Capillary 429 (*) 70 - 99 mg/dL  GLUCOSE, CAPILLARY     Status: Abnormal   Collection Time    01/06/14  7:54 AM      Result Value Ref Range   Glucose-Capillary 404 (*) 70 - 99 mg/dL  GLUCOSE, CAPILLARY     Status: Abnormal   Collection Time    01/06/14  9:54  AM      Result Value Ref Range   Glucose-Capillary 306 (*) 70 - 99 mg/dL  BLOOD GAS, ARTERIAL     Status: Abnormal   Collection Time    01/06/14 10:03 AM      Result Value Ref Range   O2 Content 2.0     Delivery systems NASAL CANNULA     pH, Arterial 7.343 (*) 7.350 - 7.450   pCO2 arterial 48.5 (*) 35.0 - 45.0 mmHg   pO2, Arterial 117.0 (*) 80.0 - 100.0 mmHg   Bicarbonate 25.6 (*) 20.0 - 24.0 mEq/L   TCO2 27.1  0 - 100 mmol/L   Acid-Base Excess 0.6  0.0 - 2.0 mmol/L   O2 Saturation 97.9     Patient temperature 98.6     Collection site RIGHT RADIAL     Drawn by [id number]     Sample type ARTERIAL DRAW     Allens test (pass/fail) PASS  PASS   Labs are reviewed and are pertinent for  Anemia, hyperglycemia Head MRI- 1. Stable age advanced white matter disease. This likely reflects  the sequela of chronic microvascular ischemia.  2. No acute intracranial abnormality or significant interval change.   Current Facility-Administered Medications  Medication Dose Route Frequency Provider Last Rate Last Dose  . acetaminophen (TYLENOL) tablet 650 mg  650 mg Oral Q4H PRN Varney Biles, MD   650 mg at 01/03/14 1818  . albuterol (PROVENTIL) (2.5 MG/3ML) 0.083% nebulizer solution 2.5 mg  2.5 mg Nebulization Q6H PRN Hosie Poisson, MD      . antiseptic oral rinse (CPC / CETYLPYRIDINIUM CHLORIDE 0.05%) solution 7 mL  7 mL Mouth Rinse BID Hosie Poisson, MD   7 mL at 01/06/14 1000  . DULoxetine (CYMBALTA) DR capsule 30 mg  30 mg Oral Daily Shanker Kristeen Mans, MD      . enoxaparin (LOVENOX) injection 30 mg  30 mg Subcutaneous Q24H Anh P Pham, RPH   30 mg at 01/05/14 1744  . feeding supplement (ENSURE COMPLETE) (ENSURE COMPLETE) liquid 237 mL  237 mL Oral BID BM Charlesetta Shanks, MD   237 mL at 01/06/14 1112  . hydrALAZINE (APRESOLINE) injection 10 mg  10 mg Intravenous Q6H PRN Shanker Kristeen Mans, MD      . insulin aspart (novoLOG) injection 0-9 Units  0-9 Units Subcutaneous TID WC Hosie Poisson, MD   5 Units at  01/05/14 1743  . insulin detemir (LEVEMIR) injection 10 Units  10 Units Subcutaneous Daily Jonetta Osgood, MD   10 Units at 01/06/14 1112  . insulin detemir (LEVEMIR) injection 12 Units  12 Units Subcutaneous QHS Jonetta Osgood, MD      . LORazepam (ATIVAN) tablet 0.5 mg  0.5 mg Oral QHS Varney Biles, MD   0.5 mg at 01/05/14 2151  . metoprolol succinate (TOPROL-XL) 24 hr tablet 100 mg  100 mg  Oral Daily Varney Biles, MD   100 mg at 01/05/14 0912  . ondansetron (ZOFRAN) tablet 4 mg  4 mg Oral Q8H PRN Varney Biles, MD   4 mg at 01/02/14 2145  . pantoprazole (PROTONIX) EC tablet 40 mg  40 mg Oral Daily Varney Biles, MD   40 mg at 01/06/14 1113  . predniSONE (DELTASONE) tablet 10 mg  10 mg Oral Daily Varney Biles, MD   10 mg at 01/06/14 1113  . risperiDONE (RISPERDAL) tablet 0.25 mg  0.25 mg Oral BID Jonetta Osgood, MD        Psychiatric Specialty Exam:     Blood pressure 120/47, pulse 87, temperature 97.9 F (36.6 C), temperature source Oral, resp. rate 17, height $RemoveBe'4\' 11"'bZXyDcmlj$  (1.499 m), weight 49.896 kg (110 lb), SpO2 99.00%.Body mass index is 22.21 kg/(m^2).  General Appearance: Fairly Groomed  Engineer, water::  Good  Speech:  Normal Rate  Volume:  Normal  Mood:  Anxious, Depressed, Hopeless and Worthless  Affect:  Appropriate  Thought Process:  Goal Directed and Linear  Orientation:  Full (Time, Place, and Person)- patient is fully oriented x 3   Thought Content:  Delusions and Hallucinations: Auditory Visual  Suicidal Thoughts:  No- denies any suicidal or homicidal ideations  Homicidal Thoughts:  No  Memory:  recednt and remote grossly intact, recall 3/3 imm and 3/3 at 3 minutes   Judgement:  Impaired  Insight:  Lacking  Psychomotor Activity:  Normal  Concentration:  Good  Recall:  Good  Fund of Knowledge:Good  Language: Good  Akathisia:  No  Handed:  Right  AIMS (if indicated):     Assets:  Desire for Improvement Resilience  Sleep:       Musculoskeletal: Strength & Muscle Tone: describes joint pain, and has hand/wrist deformity related to chronic arthritis Gait & Station: gait not examined Patient leans: N/A  Treatment Plan Summary: 1. Geriatric Psychiatric placement is pending . 2. Change Risperidone 0.25 mg BID for paranoia psychosis, Discontinue Zoloft 50 mg for depression but not helpful Start Cymbalta 30 mg PO Qam for controlling depression  3. Consider possibility of Steroid Induced Psychosis 4. Based on history of gastric atrophy/iron malabsorption concerns, would consider obtaining B12, Folate, Vitamin D3, all of which can contribute to psychiatric illnesses if chronically deficient.    Leianna Barga,JANARDHAHA R. 01/06/2014 11:23 AM

## 2014-01-06 NOTE — Progress Notes (Signed)
On call physician paged back to notify of blood sugar 74 after snack - pt encouraged to eat more of her snack. On call physician called to clarify HS levemir order.

## 2014-01-07 DIAGNOSIS — N179 Acute kidney failure, unspecified: Secondary | ICD-10-CM

## 2014-01-07 DIAGNOSIS — G9340 Encephalopathy, unspecified: Secondary | ICD-10-CM

## 2014-01-07 LAB — BASIC METABOLIC PANEL
Anion gap: 10 (ref 5–15)
BUN: 23 mg/dL (ref 6–23)
CO2: 29 meq/L (ref 19–32)
Calcium: 8.4 mg/dL (ref 8.4–10.5)
Chloride: 96 mEq/L (ref 96–112)
Creatinine, Ser: 1.32 mg/dL — ABNORMAL HIGH (ref 0.50–1.10)
GFR calc Af Amer: 45 mL/min — ABNORMAL LOW (ref >90)
GFR, EST NON AFRICAN AMERICAN: 39 mL/min — AB (ref >90)
Glucose, Bld: 174 mg/dL — ABNORMAL HIGH (ref 70–99)
POTASSIUM: 4.3 meq/L (ref 3.7–5.3)
SODIUM: 135 meq/L — AB (ref 137–147)

## 2014-01-07 LAB — GLUCOSE, CAPILLARY
GLUCOSE-CAPILLARY: 225 mg/dL — AB (ref 70–99)
GLUCOSE-CAPILLARY: 335 mg/dL — AB (ref 70–99)
Glucose-Capillary: 141 mg/dL — ABNORMAL HIGH (ref 70–99)
Glucose-Capillary: 95 mg/dL (ref 70–99)
Glucose-Capillary: 162 mg/dL — ABNORMAL HIGH (ref 70–99)

## 2014-01-07 MED ORDER — SODIUM CHLORIDE 0.9 % IV SOLN
INTRAVENOUS | Status: DC
  Administered 2014-01-07: 1000 mL via INTRAVENOUS

## 2014-01-07 MED ORDER — INSULIN DETEMIR 100 UNIT/ML ~~LOC~~ SOLN
5.0000 [IU] | Freq: Two times a day (BID) | SUBCUTANEOUS | Status: DC
  Administered 2014-01-07 (×2): 5 [IU] via SUBCUTANEOUS
  Filled 2014-01-07 (×4): qty 0.05

## 2014-01-07 NOTE — Progress Notes (Signed)
PATIENT DETAILS Name: Marisa [name] Age: 73 y.o. Sex: female Date of Birth: 03-13-1941 Admit Date: 01/02/2014 Admitting Physician Hosie Poisson, MD JXB:JYNW,GNFAOZH, MD  Subjective: Awake and alert this am. Mildly nauseous.  Assessment/Plan: Active Problems:   Hypoglycemia - Secondary to poor oral intake. 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  Acute Encephalopathy -resolved with changes to medication regimen -suspect related to medications/polypharmacy. CT head neg. ABG without significant hypercarbia.   Diabetes type 2 - Suspect has  brittle diabetes.will need to mild permissive hyper glycemia and avoid tight glycemic control. - Continue with Levemir 5 units twice a day, continue with SSI  Psychosis - Initially brought to the emergency room for hallucinations-seen by psychiatry-recommended inpatient psychiatric evaluation. -very lethargic this am-CT head neg, ABG without significant hypercarbia. Spoke with cards, stopped Depakote, changed Risperidal to 0.25 mg BID. Zoloft changed to Cymbalta  ARF -creatinine downtrending-stop Lisinopril, gently hydrate, recheck lytes prn  Hypertension - Moderately controlled with metoprolol, add Amlodipine  History of rheumatoid arthritis - Stable-on chronic steroids  History of anemia - Chronic issue, currently stable. Monitor periodically.  History of coronary disease - Currently stable.  History of bronchial asthma - Lungs clear, currently stable.  Disposition: Remain inpatient-Geripsych when bed available  DVT Prophylaxis: Prophylactic Lovenox  Code Status: Full code   Family Communication Spouse  Procedures:  None  CONSULTS:  psychiatry  MEDICATIONS: Scheduled Meds: . antiseptic oral rinse  7 mL Mouth Rinse BID  . DULoxetine  30 mg Oral Daily    . enoxaparin (LOVENOX) injection  30 mg Subcutaneous Q24H  . feeding supplement (ENSURE COMPLETE)  237 mL Oral BID BM  . insulin aspart  0-9 Units Subcutaneous TID WC  . insulin detemir  5 Units Subcutaneous BID  . LORazepam  0.5 mg Oral QHS  . metoprolol succinate  100 mg Oral Daily  . pantoprazole  40 mg Oral Daily  . predniSONE  10 mg Oral Daily  . risperiDONE  0.25 mg Oral BID   Continuous Infusions: . sodium chloride 50 mL/hr at 01/06/14 1517   PRN Meds:.acetaminophen, albuterol, hydrALAZINE, ondansetron  Antibiotics: Anti-infectives   None       PHYSICAL EXAM: Vital signs in last 24 hours: Filed Vitals:   01/06/14 1329 01/06/14 2157 01/07/14 0556 01/07/14 1331  BP: 127/42 141/73 167/79 151/67  Pulse: 78 93 93 92  Temp: 97.6 F (36.4 C) 98.1 F (36.7 C) 98.8 F (37.1 C) 98.4 F (36.9 C)  TempSrc: Oral Oral Oral Oral  Resp: 18 18 18 16   Height:      Weight:      SpO2: 99% 100% 100% 92%    Weight change:  Filed Weights   01/02/14 1939  Weight: 49.896 kg (110 lb)   Body mass index is 22.21 kg/(m^2).   Gen Exam: Awake and alert Neck: Supple, No JVD.   Chest: B/L Clear.  No rhonchi CVS: S1 S2 Regular, no murmurs.  Abdomen: soft, BS +, non tender, non distended.  Extremities: no edema, lower extremities warm to touch. Neurologic: Non Focal.   Skin: No Rash.   Wounds: N/A.    Intake/Output from previous day:  Intake/Output Summary (Last 24 hours) at 01/07/14 1359 Last data filed at 01/07/14 1011  Gross per 24 hour  Intake  872.5 ml  Output  0 ml  Net  872.5 ml     LAB RESULTS: CBC  Recent Labs Lab 01/02/14 0157 01/04/14 1312 01/06/14 1110  WBC 7.2 7.8 5.2  HGB 8.5* 9.0* 8.1*  HCT 26.9* 28.3* 25.3*  PLT 244 259 251  MCV 88.2 88.4 87.5  MCH 27.9 28.1 28.0  MCHC 31.6 31.8 32.0  RDW 14.4 14.4 14.1  LYMPHSABS 0.5* 0.3* 0.6*  MONOABS 1.1* 0.5 0.8  EOSABS 0.0 0.1 0.2  BASOSABS 0.0 0.0 0.0    Chemistries   Recent Labs Lab  01/02/14 0157 01/04/14 1312 01/05/14 1150 01/06/14 1110 01/07/14 0946  NA 140 133* 129* 135* 135*  K 4.6 4.8 5.3 4.3 4.3  CL 99 92* 90* 93* 96  CO2 31 28 27 27 29   GLUCOSE 229* 269* 335* 176* 174*  BUN 15 19 23  31* 23  CREATININE 1.09 1.11* 1.38* 1.58* 1.32*  CALCIUM 8.7 8.1* 8.0* 8.4 8.4    CBG:  Recent Labs Lab 01/06/14 2154 01/06/14 2345 01/07/14 0329 01/07/14 0748 01/07/14 1153  GLUCAP 70 75 141* 95 225*    GFR Estimated Creatinine Clearance: 26.3 ml/min (by C-G formula based on Cr of 1.32).  Coagulation profile No results found for this basename: INR, PROTIME,  in the last 168 hours  Cardiac Enzymes  Recent Labs Lab 01/02/14 0820  TROPONINI <0.30    No components found with this basename: POCBNP,  No results found for this basename: DDIMER,  in the last 72 hours No results found for this basename: HGBA1C,  in the last 72 hours No results found for this basename: CHOL, HDL, LDLCALC, TRIG, CHOLHDL, LDLDIRECT,  in the last 72 hours No results found for this basename: TSH, T4TOTAL, FREET3, T3FREE, THYROIDAB,  in the last 72 hours No results found for this basename: VITAMINB12, FOLATE, FERRITIN, TIBC, IRON, RETICCTPCT,  in the last 72 hours No results found for this basename: LIPASE, AMYLASE,  in the last 72 hours  Urine Studies No results found for this basename: UACOL, UAPR, USPG, UPH, UTP, UGL, UKET, UBIL, UHGB, UNIT, UROB, ULEU, UEPI, UWBC, URBC, UBAC, CAST, CRYS, UCOM, BILUA,  in the last 72 hours  MICROBIOLOGY: Recent Results (from the past 240 hour(s))  URINE CULTURE     Status: None   Collection Time    01/04/14 12:17 PM      Result Value Ref Range Status   Specimen Description URINE, CATHETERIZED   Final   Special Requests NONE   Final   Culture  Setup Time     Final   Value: 01/05/2014 08:38     Performed at [place]     Final   Value: NO GROWTH     Performed at [facility]   Culture     Final   Value:  NO GROWTH     Performed at [facility]   Report Status 01/06/2014 FINAL   Final    RADIOLOGY STUDIES/RESULTS: Mr Kizzie Fantasia Contrast  12/31/2013   CLINICAL DATA:  Confusion.  Delirium.  EXAM: MRI HEAD WITHOUT AND WITH CONTRAST  TECHNIQUE: Multiplanar, multiecho pulse sequences of the brain and surrounding structures were obtained without and with intravenous contrast.  CONTRAST:  5 mL MultiHance.  COMPARISON:  CT head without contrast 10/21/2013. MRI brain without and with contrast 12/07/2008.  FINDINGS: No acute infarct, hemorrhage, or mass lesion is present. Moderate periventricular and scattered subcortical white matter changes bilaterally are similar to the prior study. The  ventricles are of normal size. Flow is present in the major intracranial arteries. No significant extra-axial fluid collection is present. Globes and orbits are intact. Midline structures are normal.  The postcontrast images demonstrate no pathologic enhancement.  IMPRESSION: 1. Stable age advanced white matter disease. This likely reflects the sequela of chronic microvascular ischemia. 2. No acute intracranial abnormality or significant interval change.   Electronically Signed   By: Lawrence Santiago M.D.   On: 12/31/2013 16:00   Ct Abdomen Pelvis W Contrast  01/04/2014   CLINICAL DATA:  Abdominal pain, diarrhea  EXAM: CT ABDOMEN AND PELVIS WITH CONTRAST  TECHNIQUE: Multidetector CT imaging of the abdomen and pelvis was performed using the standard protocol following bolus administration of intravenous contrast.  CONTRAST:  33mL OMNIPAQUE IOHEXOL 300 MG/ML  SOLN  COMPARISON:  06/06/2012  FINDINGS: The lung bases are clear. There is a right Bochdalek's hernia.  The liver demonstrates no focal abnormality. There is no intrahepatic or extrahepatic biliary ductal dilatation. The gallbladder is surgically absent. There are innumerable calcifications within the spleen likely reflecting sequela of prior granulomatous disease.There  is a stable hypodense, fluid attenuating 1.6 cm left renal mass most consistent with a cyst. The right kidney, adrenal glands and pancreas are normal. There is mild bilateral nonspecific perinephric stranding. The bladder is unremarkable.  The stomach, duodenum, small intestine, and large intestine demonstrate no contrast extravasation or dilatation. There is no pneumoperitoneum, pneumatosis, or portal venous gas. There is no abdominal or pelvic free fluid. There is no lymphadenopathy. The uterus is surgically absent. The ovaries are normal in size.  The abdominal aorta is normal in caliber with atherosclerosis.  There are no lytic or sclerotic osseous lesions. There is a chronic L1 mild compression fracture. There is diffuse lumbar spine spondylosis unchanged in the prior exam. There are chronic bilateral superior and inferior pubic rami fractures. There are bilateral pole of rib fractures. There is an old left L3 transverse process fracture.  IMPRESSION: 1. No acute abdominal or pelvic pathology. 2. Lumbar spine spondylosis.   Electronically Signed   By: Kathreen Devoid   On: 01/04/2014 16:59   Dg Chest Port 1 View  01/04/2014   CLINICAL DATA:  Dyspnea, weakness  EXAM: PORTABLE CHEST - 1 VIEW  COMPARISON:  10/04/2011  FINDINGS: Cardiomediastinal silhouette is stable. No acute infiltrate or pleural effusion. No pulmonary edema. Old right rib fracture is noted.  IMPRESSION: No active disease.   Electronically Signed   By: Lahoma Crocker M.D.   On: 01/04/2014 13:40    Oren Binet, MD  [facility] Pager:[phone]  If 7PM-7AM, please contact night-coverage [email or web address] Password [email or web address] 01/07/2014, 1:59 PM   LOS: 5 days   **Disclaimer: This note may have been dictated with voice recognition software. Similar sounding words can inadvertently be transcribed and this note may contain transcription errors which may not have been corrected upon publication of note.**

## 2014-01-07 NOTE — Progress Notes (Signed)
Physical Therapy Treatment Patient Details Name: Marisa [name] MRN: [id number] DOB: 1940/05/29 Today's Date: 01/07/2014    History of Present Illness Patient is a 73 yo female admitted with Hypoglycemia and acute encephalopathy.      PT Comments    Pt was asleep in bed when PT arrived but easily woken up.  Discussed her need for further therapy with husband, as pt is not safe on her [facility] which is PLOF.  Has stairs to enter house as well.  Husband and pt will discuss with case manager to decide what best option is.    Follow Up Recommendations  SNF;Supervision/Assistance - 24 hour     Equipment Recommendations  None recommended by PT    Recommendations for Other Services Other (comment)     Precautions / Restrictions Precautions Precautions: Fall;Other (comment) (enteric precautions) Restrictions Weight Bearing Restrictions: No    Mobility  Bed Mobility Overal bed mobility: Modified Independent             General bed mobility comments: increased time to perform, no physical assist required  Transfers Overall transfer level: Needs assistance Equipment used: Straight cane (oxygen) Transfers: Sit to/from [facility] Sit to Stand: Min guard Stand pivot transfers: Min guard       General transfer comment: assist for safety  Ambulation/Gait Ambulation/Gait assistance: Min guard Ambulation Distance (Feet): 45 Feet Assistive device: Straight cane Gait Pattern/deviations: Step-through pattern;Decreased step length - right;Decreased step length - left;Wide base of support;Trunk flexed Gait velocity: reduced Gait velocity interpretation: Below normal speed for age/gender General Gait Details: lets cane lag behind, tends to limit based on her fatigue   Stairs            Wheelchair Mobility    Modified Rankin (Stroke Patients Only)       Balance Overall balance assessment: Needs assistance Sitting-balance support: Feet  supported Sitting balance-Leahy Scale: Good   Postural control: Posterior lean Standing balance support: Single extremity supported Standing balance-Leahy Scale: Fair Standing balance comment: guarding with cane due to level of medical compromise yesterday and concerns due to fatigue by her husband                    Cognition Arousal/Alertness: Awake/alert Behavior During Therapy: WFL for tasks assessed/performed Overall Cognitive Status: Within Functional Limits for tasks assessed                      Exercises General Exercises - Lower Extremity Ankle Circles/Pumps: AROM;5 reps;Both;Seated Quad Sets: AROM;Both;5 reps;Seated Gluteal Sets: AROM;Both;10 reps;Seated    General Comments General comments (skin integrity, edema, etc.): Pt has used O2 at home and cane, but is somewhat awkward mobilizing due to fatigue.  Has skin change in sacral area that limits sitting tolerance in chair      Pertinent Vitals/Pain Pain Assessment: No/denies pain BP was 167/79, pulse 93 and O2 sat 100% with nsg report.    Home Living                      Prior Function            PT Goals (current goals can now be found in the care plan section) Acute Rehab PT Goals Patient Stated Goal: to get home Progress towards PT goals: Progressing toward goals    Frequency  Min 2X/week    PT Plan Discharge plan needs to be updated    Co-evaluation  End of Session Equipment Utilized During Treatment: Oxygen;Other (comment) [facility]) Activity Tolerance: Patient limited by fatigue Patient left: in chair;with call bell/phone within reach;with nursing/sitter in room;with family/visitor present     Time: 3709-6438 PT Time Calculation (min): 30 min  Charges:  $Gait Training: 8-22 mins $Therapeutic Exercise: 8-22 mins                    G Codes:      Ramond Dial 2014-01-21, 11:57 AM Mee Hives, PT MS Acute Rehab Dept. Number: [phone]

## 2014-01-07 NOTE — Clinical Social Work Psychosocial (Signed)
Clinical Social Work Department BRIEF PSYCHOSOCIAL ASSESSMENT 01/07/2014  Patient:  Marisa [name], Marisa [name]     Account Number:  [id number]     Admit date:  01/02/2014  Clinical Social Worker:  Lovey Newcomer  Date/Time:  01/07/2014 03:00 PM  Referred by:  Physician  Date Referred:  01/07/2014 Referred for  SNF Placement   Other Referral:   Interview type:  Patient Other interview type:   Patient and husband Marisa [name] interviewed at bedside to complete assessment.    PSYCHOSOCIAL DATA Living Status:  HUSBAND Admitted from facility:   Level of care:   Primary support name:  Marisa [name] Primary support relationship to patient:  SPOUSE Degree of support available:   Support is strong. Very supportive husband.    CURRENT CONCERNS Current Concerns  Post-Acute Placement   Other Concerns:   NA    SOCIAL WORK ASSESSMENT / PLAN CSW met with patient and husband at bedside to complete assessment. CSW explained that PT has recommended SNF for patient and that if psychiatry clears patient then patient would be able to DC to SNF when medically stable. Patient is agreeable to go to SNF but would prefer to go home. Husband Marisa [name] is very supportive and states that he just wants what is best for the patient. Per Marisa [name], the patient is pretty much independent at home PTA. CSW explained SNF search/placement process answered questions. Patient and husband appeared calm and engaged in assessment.   Assessment/plan status:  Psychosocial Support/Ongoing Assessment of Needs Other assessment/ plan:   Complete Fl2, Fax, PASRR   Information/referral to community resources:   CSW contact information and SNF list given.    PATIENT'S/FAMILY'S RESPONSE TO PLAN OF CARE: Patient is agreeable to SNF placement if cleared by psychiatric which is currently recommending inpatient geri psych.       Liz Beach MSW, [place], [place], [phone]

## 2014-01-08 DIAGNOSIS — E871 Hypo-osmolality and hyponatremia: Secondary | ICD-10-CM

## 2014-01-08 LAB — GLUCOSE, CAPILLARY
GLUCOSE-CAPILLARY: 68 mg/dL — AB (ref 70–99)
Glucose-Capillary: 136 mg/dL — ABNORMAL HIGH (ref 70–99)
Glucose-Capillary: 327 mg/dL — ABNORMAL HIGH (ref 70–99)
Glucose-Capillary: 251 mg/dL — ABNORMAL HIGH (ref 70–99)
Glucose-Capillary: 180 mg/dL — ABNORMAL HIGH (ref 70–99)
Glucose-Capillary: 199 mg/dL — ABNORMAL HIGH (ref 70–99)

## 2014-01-08 LAB — VITAMIN D 1,25 DIHYDROXY
VITAMIN D 1, 25 (OH) TOTAL: 96 pg/mL — AB (ref 18–72)
VITAMIN D3 1, 25 (OH): 96 pg/mL
Vitamin D2 1, 25 (OH)2: <8 pg/mL

## 2014-01-08 LAB — CLOSTRIDIUM DIFFICILE BY PCR: CDIFFPCR: NEGATIVE

## 2014-01-08 MED ORDER — INSULIN DETEMIR 100 UNIT/ML ~~LOC~~ SOLN
8.0000 [IU] | Freq: Every day | SUBCUTANEOUS | Status: DC
  Administered 2014-01-08 – 2014-01-09 (×2): 8 [IU] via SUBCUTANEOUS
  Filled 2014-01-08 (×3): qty 0.08

## 2014-01-08 MED ORDER — INSULIN DETEMIR 100 UNIT/ML ~~LOC~~ SOLN
5.0000 [IU] | Freq: Every day | SUBCUTANEOUS | Status: DC
  Administered 2014-01-08 – 2014-01-09 (×2): 5 [IU] via SUBCUTANEOUS
  Filled 2014-01-08 (×2): qty 0.05

## 2014-01-08 MED ORDER — AMLODIPINE BESYLATE 5 MG PO TABS
5.0000 mg | ORAL_TABLET | Freq: Every day | ORAL | Status: DC
  Administered 2014-01-08 – 2014-01-09 (×2): 5 mg via ORAL
  Filled 2014-01-08 (×3): qty 1

## 2014-01-08 NOTE — Progress Notes (Signed)
Hypoglycemic Event  CBG:68  Treatment: 15 GM carbohydrate snack  Symptoms: None  Follow-up CBG: Time:0830 CBG Result:136  Possible Reasons for Event: Unknown    Marisa [name]  Remember to initiate Hypoglycemia Order Set & complete

## 2014-01-08 NOTE — Progress Notes (Signed)
PATIENT DETAILS Name: Marisa [name] Age: 73 y.o. Sex: female Date of Birth: 01-11-1941 Admit Date: 01/02/2014 Admitting Physician Hosie Poisson, MD [id number], MD  Subjective: Awake and alert this am. Claims that she did not get much sleep last night-wants to go back to sleep  Assessment/Plan: Active Problems:   Hypoglycemia - Secondary to poor oral intake. 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  Acute Encephalopathy -resolved with changes to medication regimen -suspect related to medications/polypharmacy. CT head neg. ABG without significant hypercarbia.   Diabetes type 2 - Suspect has  brittle diabetes.will need to mild permissive hyper glycemia and avoid tight glycemic control. - Continue with Levemir -change to 6 units in am, 5 units in pm, continue with SSI  Psychosis - Initially brought to the emergency room for hallucinations-seen by psychiatry-recommended inpatient psychiatric evaluation. -very lethargic this am-CT head neg, ABG without significant hypercarbia. Spoke with psycg, stopped Depakote, changed Risperidal to 0.25 mg BID. Zoloft changed to Cymbalta. Psych recommending Geri-psych on discharge, awaiting bed  ARF -creatinine downtrending-stop Lisinopril, gently hydrate, recheck lytes prn  Hypertension - Moderately controlled with metoprolol, add Amlodipine  History of rheumatoid arthritis - Stable-on chronic steroids  History of anemia - Chronic issue, currently stable. Monitor periodically.  History of coronary disease - Currently stable.  History of bronchial asthma - Lungs clear, currently stable.  Disposition: Remain inpatient-Geripsych when bed available  DVT Prophylaxis: Prophylactic Lovenox  Code Status: Full code   Family  Communication Spouse  Procedures:  None  CONSULTS:  psychiatry  MEDICATIONS: Scheduled Meds: . antiseptic oral rinse  7 mL Mouth Rinse BID  . DULoxetine  30 mg Oral Daily  . enoxaparin (LOVENOX) injection  30 mg Subcutaneous Q24H  . feeding supplement (ENSURE COMPLETE)  237 mL Oral BID BM  . insulin aspart  0-9 Units Subcutaneous TID WC  . insulin detemir  5 Units Subcutaneous QHS  . insulin detemir  8 Units Subcutaneous Daily  . LORazepam  0.5 mg Oral QHS  . metoprolol succinate  100 mg Oral Daily  . pantoprazole  40 mg Oral Daily  . predniSONE  10 mg Oral Daily  . risperiDONE  0.25 mg Oral BID   Continuous Infusions: . sodium chloride 1,000 mL (01/07/14 1934)   PRN Meds:.acetaminophen, albuterol, hydrALAZINE, ondansetron  Antibiotics: Anti-infectives   None       PHYSICAL EXAM: Vital signs in last 24 hours: Filed Vitals:   01/07/14 1331 01/07/14 2100 01/08/14 0633 01/08/14 1020  BP: 151/67 168/86 166/54 153/68  Pulse: 92 96 90 94  Temp: 98.4 F (36.9 C) 98.3 F (36.8 C) 98.9 F (37.2 C)   TempSrc: Oral Oral Oral   Resp: 16 16 16    Height:      Weight:      SpO2: 92% 100% 100%     Weight change:  Filed Weights   01/02/14 1939  Weight: 49.896 kg (110 lb)   Body mass index is 22.21 kg/(m^2).   Gen Exam: Awake and alert Neck: Supple, No JVD.   Chest: B/L Clear.  No rhonchi CVS: S1 S2 Regular, no murmurs.  Abdomen: soft, BS +, non tender, non distended.  Extremities: no edema, lower extremities warm to touch. Neurologic: Non Focal.   Skin: No Rash.   Wounds: N/A.    Intake/Output from previous day:  Intake/Output Summary (Last 24 hours) at 01/08/14 1039 Last data filed at 01/08/14 0915  Gross per 24 hour  Intake   1090 ml  Output      0 ml  Net   1090 ml     LAB RESULTS: CBC  Recent Labs Lab 01/02/14 0157 01/04/14 1312 01/06/14 1110  WBC 7.2 7.8 5.2  HGB 8.5* 9.0* 8.1*  HCT 26.9* 28.3* 25.3*  PLT 244 259 251  MCV 88.2 88.4  87.5  MCH 27.9 28.1 28.0  MCHC 31.6 31.8 32.0  RDW 14.4 14.4 14.1  LYMPHSABS 0.5* 0.3* 0.6*  MONOABS 1.1* 0.5 0.8  EOSABS 0.0 0.1 0.2  BASOSABS 0.0 0.0 0.0    Chemistries   Recent Labs Lab 01/02/14 0157 01/04/14 1312 01/05/14 1150 01/06/14 1110 01/07/14 0946  NA 140 133* 129* 135* 135*  K 4.6 4.8 5.3 4.3 4.3  CL 99 92* 90* 93* 96  CO2 31 28 27 27 29   GLUCOSE 229* 269* 335* 176* 174*  BUN 15 19 23  31* 23  CREATININE 1.09 1.11* 1.38* 1.58* 1.32*  CALCIUM 8.7 8.1* 8.0* 8.4 8.4    CBG:  Recent Labs Lab 01/07/14 1153 01/07/14 1655 01/07/14 2138 01/08/14 0758 01/08/14 0826  GLUCAP 225* 162* 335* 68* 136*    GFR Estimated Creatinine Clearance: 26.3 ml/min (by C-G formula based on Cr of 1.32).  Coagulation profile No results found for this basename: INR, PROTIME,  in the last 168 hours  Cardiac Enzymes  Recent Labs Lab 01/02/14 0820  TROPONINI <0.30    No components found with this basename: POCBNP,  No results found for this basename: DDIMER,  in the last 72 hours No results found for this basename: HGBA1C,  in the last 72 hours No results found for this basename: CHOL, HDL, LDLCALC, TRIG, CHOLHDL, LDLDIRECT,  in the last 72 hours No results found for this basename: TSH, T4TOTAL, FREET3, T3FREE, THYROIDAB,  in the last 72 hours No results found for this basename: VITAMINB12, FOLATE, FERRITIN, TIBC, IRON, RETICCTPCT,  in the last 72 hours No results found for this basename: LIPASE, AMYLASE,  in the last 72 hours  Urine Studies No results found for this basename: UACOL, UAPR, USPG, UPH, UTP, UGL, UKET, UBIL, UHGB, UNIT, UROB, ULEU, UEPI, UWBC, URBC, UBAC, CAST, CRYS, UCOM, BILUA,  in the last 72 hours  MICROBIOLOGY: Recent Results (from the past 240 hour(s))  URINE CULTURE     Status: None   Collection Time    01/04/14 12:17 PM      Result Value Ref Range Status   Specimen Description URINE, CATHETERIZED   Final   Special Requests NONE   Final    Culture  Setup Time     Final   Value: 01/05/2014 08:38     Performed at [facility]     Final   Value: NO GROWTH     Performed at [facility]   Culture     Final   Value: NO GROWTH     Performed at [facility]   Report Status 01/06/2014 FINAL   Final    RADIOLOGY STUDIES/RESULTS: Mr Kizzie Fantasia Contrast  12/31/2013   CLINICAL DATA:  Confusion.  Delirium.  EXAM: MRI HEAD WITHOUT AND WITH CONTRAST  TECHNIQUE: Multiplanar, multiecho pulse sequences of the brain and surrounding structures were obtained without and with intravenous contrast.  CONTRAST:  5 mL MultiHance.  COMPARISON:  CT head without contrast 10/21/2013. MRI  brain without and with contrast 12/07/2008.  FINDINGS: No acute infarct, hemorrhage, or mass lesion is present. Moderate periventricular and scattered subcortical white matter changes bilaterally are similar to the prior study. The ventricles are of normal size. Flow is present in the major intracranial arteries. No significant extra-axial fluid collection is present. Globes and orbits are intact. Midline structures are normal.  The postcontrast images demonstrate no pathologic enhancement.  IMPRESSION: 1. Stable age advanced white matter disease. This likely reflects the sequela of chronic microvascular ischemia. 2. No acute intracranial abnormality or significant interval change.   Electronically Signed   By: Lawrence Santiago M.D.   On: 12/31/2013 16:00   Ct Abdomen Pelvis W Contrast  01/04/2014   CLINICAL DATA:  Abdominal pain, diarrhea  EXAM: CT ABDOMEN AND PELVIS WITH CONTRAST  TECHNIQUE: Multidetector CT imaging of the abdomen and pelvis was performed using the standard protocol following bolus administration of intravenous contrast.  CONTRAST:  5mL OMNIPAQUE IOHEXOL 300 MG/ML  SOLN  COMPARISON:  06/06/2012  FINDINGS: The lung bases are clear. There is a right Bochdalek's hernia.  The liver demonstrates no focal abnormality. There is no  intrahepatic or extrahepatic biliary ductal dilatation. The gallbladder is surgically absent. There are innumerable calcifications within the spleen likely reflecting sequela of prior granulomatous disease.There is a stable hypodense, fluid attenuating 1.6 cm left renal mass most consistent with a cyst. The right kidney, adrenal glands and pancreas are normal. There is mild bilateral nonspecific perinephric stranding. The bladder is unremarkable.  The stomach, duodenum, small intestine, and large intestine demonstrate no contrast extravasation or dilatation. There is no pneumoperitoneum, pneumatosis, or portal venous gas. There is no abdominal or pelvic free fluid. There is no lymphadenopathy. The uterus is surgically absent. The ovaries are normal in size.  The abdominal aorta is normal in caliber with atherosclerosis.  There are no lytic or sclerotic osseous lesions. There is a chronic L1 mild compression fracture. There is diffuse lumbar spine spondylosis unchanged in the prior exam. There are chronic bilateral superior and inferior pubic rami fractures. There are bilateral pole of rib fractures. There is an old left L3 transverse process fracture.  IMPRESSION: 1. No acute abdominal or pelvic pathology. 2. Lumbar spine spondylosis.   Electronically Signed   By: Kathreen Devoid   On: 01/04/2014 16:59   Dg Chest Port 1 View  01/04/2014   CLINICAL DATA:  Dyspnea, weakness  EXAM: PORTABLE CHEST - 1 VIEW  COMPARISON:  10/04/2011  FINDINGS: Cardiomediastinal silhouette is stable. No acute infiltrate or pleural effusion. No pulmonary edema. Old right rib fracture is noted.  IMPRESSION: No active disease.   Electronically Signed   By: Lahoma Crocker M.D.   On: 01/04/2014 13:40    Oren Binet, MD  [facility] Pager:[phone]  If 7PM-7AM, please contact night-coverage [email or web address] Password [email or web address] 01/08/2014, 10:39 AM   LOS: 6 days   **Disclaimer: This note may have been dictated with voice recognition  software. Similar sounding words can inadvertently be transcribed and this note may contain transcription errors which may not have been corrected upon publication of note.**

## 2014-01-09 DIAGNOSIS — J9610 Chronic respiratory failure, unspecified whether with hypoxia or hypercapnia: Secondary | ICD-10-CM

## 2014-01-09 DIAGNOSIS — R0902 Hypoxemia: Secondary | ICD-10-CM

## 2014-01-09 LAB — GLUCOSE, CAPILLARY
GLUCOSE-CAPILLARY: 61 mg/dL — AB (ref 70–99)
GLUCOSE-CAPILLARY: 98 mg/dL (ref 70–99)
GLUCOSE-CAPILLARY: 184 mg/dL — AB (ref 70–99)
Glucose-Capillary: 307 mg/dL — ABNORMAL HIGH (ref 70–99)
Glucose-Capillary: 341 mg/dL — ABNORMAL HIGH (ref 70–99)
Glucose-Capillary: 354 mg/dL — ABNORMAL HIGH (ref 70–99)

## 2014-01-09 MED ORDER — IPRATROPIUM-ALBUTEROL 0.5-2.5 (3) MG/3ML IN SOLN
3.0000 mL | Freq: Three times a day (TID) | RESPIRATORY_TRACT | Status: DC
Start: 2014-01-09 — End: 2014-01-09

## 2014-01-09 NOTE — Progress Notes (Signed)
Patient requested to have CBG checked due to excessive sweating. CBG 61, 15 gm carbohydrate snack given. CBG rechecked and was found to be 98. Will continue to monitor.

## 2014-01-09 NOTE — Progress Notes (Signed)
On-call provider notified of CBG 354. No further orders at this time.

## 2014-01-09 NOTE — Progress Notes (Signed)
PATIENT DETAILS Name: Marisa [name] Age: 73 y.o. Sex: female Date of Birth: 17-Oct-1940 Admit Date: 01/02/2014 Admitting Physician Hosie Poisson, MD [id number], MD  Subjective: Awake and alert this am. No major complaints  Assessment/Plan: Active Problems:   Hypoglycemia - Secondary to poor oral intake. 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  Acute Encephalopathy -resolved with changes to medication regimen -suspect related to medications/polypharmacy. CT head neg. ABG without significant hypercarbia.   Diabetes type 2 - Suspect has  brittle diabetes.will need to mild permissive hyper glycemia and avoid tight glycemic control. - Continue with Levemir -change to 8 units in am, 5 units in pm, continue with SSI  Psychosis - Initially brought to the emergency room for hallucinations-seen by psychiatry-recommended inpatient psychiatric evaluation. -very lethargic this am-CT head neg, ABG without significant hypercarbia. Spoke with psycg, stopped Depakote, changed Risperidal to 0.25 mg BID. Zoloft changed to Cymbalta. Psych recommending Geri-psych on discharge, awaiting bed  ARF -creatinine downtrending-stop Lisinopril, gently hydrate, recheck lytes prn  Hypertension - Moderately controlled with metoprolol, add Amlodipine  History of rheumatoid arthritis - Stable-on chronic steroids  History of anemia - Chronic issue, currently stable. Monitor periodically.  History of coronary disease - Currently stable.  History of bronchial asthma - Lungs clear, currently stable-will add scheduled Nebs  Chronic Resp Failure: -on home O2-2.5L/m-continue  Disposition: Remain inpatient-Geripsych when bed available  DVT Prophylaxis: Prophylactic Lovenox  Code Status: Full  code   Family Communication Spouse  Procedures:  None  CONSULTS:  psychiatry  MEDICATIONS: Scheduled Meds: . amLODipine  5 mg Oral Daily  . antiseptic oral rinse  7 mL Mouth Rinse BID  . DULoxetine  30 mg Oral Daily  . enoxaparin (LOVENOX) injection  30 mg Subcutaneous Q24H  . feeding supplement (ENSURE COMPLETE)  237 mL Oral BID BM  . insulin aspart  0-9 Units Subcutaneous TID WC  . insulin detemir  5 Units Subcutaneous QHS  . insulin detemir  8 Units Subcutaneous Daily  . LORazepam  0.5 mg Oral QHS  . metoprolol succinate  100 mg Oral Daily  . pantoprazole  40 mg Oral Daily  . predniSONE  10 mg Oral Daily  . risperiDONE  0.25 mg Oral BID   Continuous Infusions:   PRN Meds:.acetaminophen, albuterol, hydrALAZINE, ondansetron  Antibiotics: Anti-infectives   None       PHYSICAL EXAM: Vital signs in last 24 hours: Filed Vitals:   01/09/14 0536 01/09/14 0631 01/09/14 0957 01/09/14 0958  BP: 184/57 155/67 121/57   Pulse: 88 86  97  Temp: 99.1 F (37.3 C)     TempSrc: Oral     Resp: 18     Height:      Weight:      SpO2: 100%       Weight change:  Filed Weights   01/02/14 1939  Weight: 49.896 kg (110 lb)   Body mass index is 22.21 kg/(m^2).   Gen Exam: Awake and alert Neck: Supple, No JVD.   Chest: B/L Clear.  No rhonchi CVS: S1 S2 Regular, no murmurs.  Abdomen: soft, BS +, non tender, non distended.  Extremities: no edema, lower extremities warm to touch. Neurologic: Non Focal.   Skin: No Rash.   Wounds: N/A.    Intake/Output from previous day:  Intake/Output Summary (Last 24 hours) at 01/09/14 1025 Last data filed at 01/08/14 1940  Gross per 24 hour  Intake    120 ml  Output    250 ml  Net   -130 ml     LAB RESULTS: CBC  Recent Labs Lab 01/04/14 1312 01/06/14 1110  WBC 7.8 5.2  HGB 9.0* 8.1*  HCT 28.3* 25.3*  PLT 259 251  MCV 88.4 87.5  MCH 28.1 28.0  MCHC 31.8 32.0  RDW 14.4 14.1  LYMPHSABS 0.3* 0.6*  MONOABS 0.5 0.8    EOSABS 0.1 0.2  BASOSABS 0.0 0.0    Chemistries   Recent Labs Lab 01/04/14 1312 01/05/14 1150 01/06/14 1110 01/07/14 0946  NA 133* 129* 135* 135*  K 4.8 5.3 4.3 4.3  CL 92* 90* 93* 96  CO2 28 27 27 29   GLUCOSE 269* 335* 176* 174*  BUN 19 23 31* 23  CREATININE 1.11* 1.38* 1.58* 1.32*  CALCIUM 8.1* 8.0* 8.4 8.4    CBG:  Recent Labs Lab 01/08/14 1701 01/08/14 2141 01/09/14 0314 01/09/14 0351 01/09/14 0748  GLUCAP 180* 199* 61* 98 184*    GFR Estimated Creatinine Clearance: 26.3 ml/min (by C-G formula based on Cr of 1.32).  Coagulation profile No results found for this basename: INR, PROTIME,  in the last 168 hours  Cardiac Enzymes No results found for this basename: CK, CKMB, TROPONINI, MYOGLOBIN,  in the last 168 hours  No components found with this basename: POCBNP,  No results found for this basename: DDIMER,  in the last 72 hours No results found for this basename: HGBA1C,  in the last 72 hours No results found for this basename: CHOL, HDL, LDLCALC, TRIG, CHOLHDL, LDLDIRECT,  in the last 72 hours No results found for this basename: TSH, T4TOTAL, FREET3, T3FREE, THYROIDAB,  in the last 72 hours No results found for this basename: VITAMINB12, FOLATE, FERRITIN, TIBC, IRON, RETICCTPCT,  in the last 72 hours No results found for this basename: LIPASE, AMYLASE,  in the last 72 hours  Urine Studies No results found for this basename: UACOL, UAPR, USPG, UPH, UTP, UGL, UKET, UBIL, UHGB, UNIT, UROB, ULEU, UEPI, UWBC, URBC, UBAC, CAST, CRYS, UCOM, BILUA,  in the last 72 hours  MICROBIOLOGY: Recent Results (from the past 240 hour(s))  URINE CULTURE     Status: None   Collection Time    01/04/14 12:17 PM      Result Value Ref Range Status   Specimen Description URINE, CATHETERIZED   Final   Special Requests NONE   Final   Culture  Setup Time     Final   Value: 01/05/2014 08:38     Performed at [facility]     Final   Value: NO GROWTH      Performed at [facility]   Culture     Final   Value: NO GROWTH     Performed at [facility]   Report Status 01/06/2014 FINAL   Final  CLOSTRIDIUM DIFFICILE BY PCR     Status: None   Collection Time    01/08/14 10:32 AM      Result Value Ref Range Status   C difficile by pcr NEGATIVE  NEGATIVE Final    RADIOLOGY STUDIES/RESULTS: Mr Kizzie Fantasia Contrast  12/31/2013   CLINICAL DATA:  Confusion.  Delirium.  EXAM: MRI HEAD WITHOUT AND WITH CONTRAST  TECHNIQUE: Multiplanar, multiecho pulse sequences of the brain and surrounding structures  were obtained without and with intravenous contrast.  CONTRAST:  5 mL MultiHance.  COMPARISON:  CT head without contrast 10/21/2013. MRI brain without and with contrast 12/07/2008.  FINDINGS: No acute infarct, hemorrhage, or mass lesion is present. Moderate periventricular and scattered subcortical white matter changes bilaterally are similar to the prior study. The ventricles are of normal size. Flow is present in the major intracranial arteries. No significant extra-axial fluid collection is present. Globes and orbits are intact. Midline structures are normal.  The postcontrast images demonstrate no pathologic enhancement.  IMPRESSION: 1. Stable age advanced white matter disease. This likely reflects the sequela of chronic microvascular ischemia. 2. No acute intracranial abnormality or significant interval change.   Electronically Signed   By: Lawrence Santiago M.D.   On: 12/31/2013 16:00   Ct Abdomen Pelvis W Contrast  01/04/2014   CLINICAL DATA:  Abdominal pain, diarrhea  EXAM: CT ABDOMEN AND PELVIS WITH CONTRAST  TECHNIQUE: Multidetector CT imaging of the abdomen and pelvis was performed using the standard protocol following bolus administration of intravenous contrast.  CONTRAST:  47mL OMNIPAQUE IOHEXOL 300 MG/ML  SOLN  COMPARISON:  06/06/2012  FINDINGS: The lung bases are clear. There is a right Bochdalek's hernia.  The liver demonstrates no focal  abnormality. There is no intrahepatic or extrahepatic biliary ductal dilatation. The gallbladder is surgically absent. There are innumerable calcifications within the spleen likely reflecting sequela of prior granulomatous disease.There is a stable hypodense, fluid attenuating 1.6 cm left renal mass most consistent with a cyst. The right kidney, adrenal glands and pancreas are normal. There is mild bilateral nonspecific perinephric stranding. The bladder is unremarkable.  The stomach, duodenum, small intestine, and large intestine demonstrate no contrast extravasation or dilatation. There is no pneumoperitoneum, pneumatosis, or portal venous gas. There is no abdominal or pelvic free fluid. There is no lymphadenopathy. The uterus is surgically absent. The ovaries are normal in size.  The abdominal aorta is normal in caliber with atherosclerosis.  There are no lytic or sclerotic osseous lesions. There is a chronic L1 mild compression fracture. There is diffuse lumbar spine spondylosis unchanged in the prior exam. There are chronic bilateral superior and inferior pubic rami fractures. There are bilateral pole of rib fractures. There is an old left L3 transverse process fracture.  IMPRESSION: 1. No acute abdominal or pelvic pathology. 2. Lumbar spine spondylosis.   Electronically Signed   By: Kathreen Devoid   On: 01/04/2014 16:59   Dg Chest Port 1 View  01/04/2014   CLINICAL DATA:  Dyspnea, weakness  EXAM: PORTABLE CHEST - 1 VIEW  COMPARISON:  10/04/2011  FINDINGS: Cardiomediastinal silhouette is stable. No acute infiltrate or pleural effusion. No pulmonary edema. Old right rib fracture is noted.  IMPRESSION: No active disease.   Electronically Signed   By: Lahoma Crocker M.D.   On: 01/04/2014 13:40    Oren Binet, MD  [facility] Pager:[phone]  If 7PM-7AM, please contact night-coverage [email or web address] Password [email or web address] 01/09/2014, 10:25 AM   LOS: 7 days   **Disclaimer: This note may have been  dictated with voice recognition software. Similar sounding words can inadvertently be transcribed and this note may contain transcription errors which may not have been corrected upon publication of note.**

## 2014-01-09 NOTE — Clinical Social Work Note (Signed)
CSW received consult for geri-psych placement. CSW contacted the following facilities regarding placement:  [facility] [place]) 1 female bed [facility]) 1 female bed, clinicals submitted for review [facility]- ([place]) no geri-psych [facility]) bed will be available in the evening, clinicals submitted for review [facility]- no geri-pysch beds, clinicals submitted for review for wait list [id number]- Sharyn [facility]) no geri-psych beds [facility]- [facility]) no geri-psych beds, clinicals submitted for review for wait list  CSW to continue to follow for geri-psych placement.  [place], [place] Weekend Clinical Social Worker [phone]

## 2014-01-10 LAB — CBC
HCT: 27.6 % — ABNORMAL LOW (ref 36.0–46.0)
Hemoglobin: 8.8 g/dL — ABNORMAL LOW (ref 12.0–15.0)
MCH: 28.2 pg (ref 26.0–34.0)
MCHC: 31.9 g/dL (ref 30.0–36.0)
MCV: 88.5 fL (ref 78.0–100.0)
PLATELETS: 315 10*3/uL (ref 150–400)
RBC: 3.12 MIL/uL — ABNORMAL LOW (ref 3.87–5.11)
RDW: 14 % (ref 11.5–15.5)
WBC: 9.2 10*3/uL (ref 4.0–10.5)

## 2014-01-10 LAB — GLUCOSE, CAPILLARY
GLUCOSE-CAPILLARY: 142 mg/dL — AB (ref 70–99)
GLUCOSE-CAPILLARY: 229 mg/dL — AB (ref 70–99)
GLUCOSE-CAPILLARY: 303 mg/dL — AB (ref 70–99)
GLUCOSE-CAPILLARY: 348 mg/dL — AB (ref 70–99)
Glucose-Capillary: 239 mg/dL — ABNORMAL HIGH (ref 70–99)
Glucose-Capillary: 492 mg/dL — ABNORMAL HIGH (ref 70–99)
Glucose-Capillary: 96 mg/dL (ref 70–99)

## 2014-01-10 LAB — BASIC METABOLIC PANEL
Anion gap: 10 (ref 5–15)
BUN: 20 mg/dL (ref 6–23)
CALCIUM: 8.6 mg/dL (ref 8.4–10.5)
CO2: 32 mEq/L (ref 19–32)
CREATININE: 1.06 mg/dL (ref 0.50–1.10)
Chloride: 93 mEq/L — ABNORMAL LOW (ref 96–112)
GFR, EST AFRICAN AMERICAN: 59 mL/min — AB (ref >90)
GFR, EST NON AFRICAN AMERICAN: 51 mL/min — AB (ref >90)
GLUCOSE: 260 mg/dL — AB (ref 70–99)
Potassium: 4.4 mEq/L (ref 3.7–5.3)
Sodium: 135 mEq/L — ABNORMAL LOW (ref 137–147)

## 2014-01-10 MED ORDER — INSULIN ASPART 100 UNIT/ML ~~LOC~~ SOLN
12.0000 [IU] | Freq: Once | SUBCUTANEOUS | Status: AC
  Administered 2014-01-10: 12 [IU] via SUBCUTANEOUS

## 2014-01-10 MED ORDER — LORAZEPAM 0.5 MG PO TABS: 0.5000 mg | ORAL_TABLET | Freq: Three times a day (TID) | ORAL | Status: DC | PRN

## 2014-01-10 MED ORDER — ENOXAPARIN SODIUM 40 MG/0.4ML ~~LOC~~ SOLN
40.0000 mg | SUBCUTANEOUS | Status: DC
  Administered 2014-01-10 – 2014-01-11 (×2): 40 mg via SUBCUTANEOUS
  Filled 2014-01-10 (×3): qty 0.4

## 2014-01-10 MED ORDER — POLYETHYLENE GLYCOL 3350 17 G PO PACK
17.0000 g | PACK | Freq: Every day | ORAL | Status: DC
  Administered 2014-01-10: 17 g via ORAL
  Filled 2014-01-10 (×3): qty 1

## 2014-01-10 MED ORDER — DIPHENHYDRAMINE HCL 25 MG PO CAPS
50.0000 mg | ORAL_CAPSULE | Freq: Once | ORAL | Status: AC
  Administered 2014-01-10: 50 mg via ORAL
  Filled 2014-01-10: qty 2

## 2014-01-10 MED ORDER — HYDROCORTISONE ACE-PRAMOXINE 1-1 % RE FOAM
1.0000 | Freq: Two times a day (BID) | RECTAL | Status: DC
  Administered 2014-01-10 – 2014-01-12 (×3): 1 via RECTAL
  Filled 2014-01-10: qty 10

## 2014-01-10 MED ORDER — DIPHENHYDRAMINE HCL 25 MG PO CAPS: 25.0000 mg | ORAL_CAPSULE | ORAL | Status: DC | PRN

## 2014-01-10 MED ORDER — INSULIN DETEMIR 100 UNIT/ML ~~LOC~~ SOLN
10.0000 [IU] | Freq: Every day | SUBCUTANEOUS | Status: DC
Start: 2014-01-10 — End: 2014-01-12
  Administered 2014-01-10 – 2014-01-12 (×3): 10 [IU] via SUBCUTANEOUS
  Filled 2014-01-10 (×3): qty 0.1

## 2014-01-10 MED ORDER — AMLODIPINE BESYLATE 10 MG PO TABS
10.0000 mg | ORAL_TABLET | Freq: Every day | ORAL | Status: DC
  Administered 2014-01-10 – 2014-01-12 (×3): 10 mg via ORAL
  Filled 2014-01-10 (×3): qty 1

## 2014-01-10 MED ORDER — LORAZEPAM 0.5 MG PO TABS
0.5000 mg | ORAL_TABLET | Freq: Once | ORAL | Status: AC
  Administered 2014-01-10: 0.5 mg via ORAL
  Filled 2014-01-10: qty 1

## 2014-01-10 MED ORDER — INSULIN DETEMIR 100 UNIT/ML ~~LOC~~ SOLN
7.0000 [IU] | Freq: Every day | SUBCUTANEOUS | Status: DC
  Administered 2014-01-10: 7 [IU] via SUBCUTANEOUS
  Filled 2014-01-10 (×2): qty 0.07

## 2014-01-10 NOTE — Progress Notes (Signed)
PATIENT DETAILS Name: Marisa [name] Age: 73 y.o. Sex: female Date of Birth: 1940-05-21 Admit Date: 01/02/2014 Admitting Physician Hosie Poisson, MD UXN:ATFT,DDUKGUR, MD  Subjective: Awake.  Pleasant.  No complaints.  Assessment/Plan: Active Problems: Hypoglycemia - Secondary to poor oral intake. 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  Acute Encephalopathy -resolved with changes to medication regimen -suspect related to medications/polypharmacy. CT head neg. ABG without significant hypercarbia.   Diabetes type 2 - Suspect has  brittle diabetes.will need to mild permissive hyper glycemia and avoid tight glycemic control. - Continue with Levemir -change to 8 units in am, 5 units in pm, continue with SSI  Psychosis - Initially brought to the emergency room for hallucinations-seen by psychiatry-recommended inpatient psychiatric evaluation.  - had a period of lethargy on 9/25.   CT head neg, ABG without significant hypercarbia. Medications adjusted. - Spoke with psych, stopped Depakote, changed Risperidal to 0.25 mg BID. Zoloft changed to Cymbalta.  - 01/10/2014  Patient pleasant and alert today. - Have asked Psych for re-eval to determine if Marisa [name] is still needed on discharge and if sitter is still necessary. -awaiting bed  ARF -Resolved. -stop Lisinopril, gently hydrate, recheck lytes prn  Hypertension - Moderately controlled with metoprolol, add Amlodipine  History of rheumatoid arthritis - Stable-on chronic steroids - Mild swelling/erythema of right MCP on 9/28  History of anemia - Chronic issue, currently stable. Monitor periodically.  History of coronary disease - Currently stable.  History of bronchial asthma - Lungs clear, currently stable - PRN  Nebs  Chronic Resp Failure: -on home O2-2.5L/m-continue  Disposition: Remain inpatient until bed is available (SNF)  DVT Prophylaxis: Prophylactic Lovenox  Code Status: Full code   Family Communication   Procedures:  None  CONSULTS:  psychiatry  MEDICATIONS: Scheduled Meds: . amLODipine  10 mg Oral Daily  . antiseptic oral rinse  7 mL Mouth Rinse BID  . DULoxetine  30 mg Oral Daily  . enoxaparin (LOVENOX) injection  30 mg Subcutaneous Q24H  . feeding supplement (ENSURE COMPLETE)  237 mL Oral BID BM  . hydrocortisone-pramoxine  1 applicator Rectal BID  . insulin aspart  0-9 Units Subcutaneous TID WC  . insulin detemir  10 Units Subcutaneous Daily  . insulin detemir  7 Units Subcutaneous QHS  . LORazepam  0.5 mg Oral QHS  . metoprolol succinate  100 mg Oral Daily  . pantoprazole  40 mg Oral Daily  . polyethylene glycol  17 g Oral Daily  . predniSONE  10 mg Oral Daily  . risperiDONE  0.25 mg Oral BID   Continuous Infusions:   PRN Meds:.acetaminophen, albuterol, diphenhydrAMINE, hydrALAZINE, LORazepam, ondansetron  Antibiotics: Anti-infectives   None       PHYSICAL EXAM: Vital signs in last 24 hours: Filed Vitals:   01/09/14 1355 01/09/14 2103 01/10/14 0459 01/10/14 1003  BP: 132/67 167/90 172/71 139/52  Pulse: 100 84 94 89  Temp: 98.6 F (37 C) 98 F (36.7 C) 97.7 F (36.5 C)   TempSrc: Oral Oral Oral   Resp: 16 18 18    Height:      Weight:      SpO2: 93% 99% 100%     Weight change:  Filed Weights   01/02/14 1939  Weight: 49.896 kg (110 lb)   Body mass  index is 22.21 kg/(m^2).   Gen Exam: Awake and alert, clear speech, sitter at bedside. Neck: Supple, No JVD.   Chest: B/L Clear.  No rhonchi CVS: S1 S2 irreg irreg, no murmurs.  Abdomen: thin, soft, BS +, non tender, non distended.  Extremities: no edema, lower extremities warm to touch. Right MCP swollen and slightly red. Neurologic: Non Focal.   .    Intake/Output from previous  day:  Intake/Output Summary (Last 24 hours) at 01/10/14 1052 Last data filed at 01/10/14 0916  Gross per 24 hour  Intake    420 ml  Output      0 ml  Net    420 ml     LAB RESULTS: CBC  Recent Labs Lab 01/04/14 1312 01/06/14 1110 01/10/14 0912  WBC 7.8 5.2 9.2  HGB 9.0* 8.1* 8.8*  HCT 28.3* 25.3* 27.6*  PLT 259 251 315  MCV 88.4 87.5 88.5  MCH 28.1 28.0 28.2  MCHC 31.8 32.0 31.9  RDW 14.4 14.1 14.0  LYMPHSABS 0.3* 0.6*  --   MONOABS 0.5 0.8  --   EOSABS 0.1 0.2  --   BASOSABS 0.0 0.0  --     Chemistries   Recent Labs Lab 01/04/14 1312 01/05/14 1150 01/06/14 1110 01/07/14 0946 01/10/14 0650  NA 133* 129* 135* 135* 135*  K 4.8 5.3 4.3 4.3 4.4  CL 92* 90* 93* 96 93*  CO2 28 27 27 29  32  GLUCOSE 269* 335* 176* 174* 260*  BUN 19 23 31* 23 20  CREATININE 1.11* 1.38* 1.58* 1.32* 1.06  CALCIUM 8.1* 8.0* 8.4 8.4 8.6    CBG:  Recent Labs Lab 01/09/14 1659 01/09/14 2037 01/10/14 0032 01/10/14 0453 01/10/14 0737  GLUCAP 341* 354* 348* 303* 239*    GFR Estimated Creatinine Clearance: 32.7 ml/min (by C-G formula based on Cr of 1.06).   MICROBIOLOGY: Recent Results (from the past 240 hour(s))  URINE CULTURE     Status: None   Collection Time    01/04/14 12:17 PM      Result Value Ref Range Status   Specimen Description URINE, CATHETERIZED   Final   Special Requests NONE   Final   Culture  Setup Time     Final   Value: 01/05/2014 08:38     Performed at [place]     Final   Value: NO GROWTH     Performed at [facility]   Culture     Final   Value: NO GROWTH     Performed at [facility]   Report Status 01/06/2014 FINAL   Final  CLOSTRIDIUM DIFFICILE BY PCR     Status: None   Collection Time    01/08/14 10:32 AM      Result Value Ref Range Status   C difficile by pcr NEGATIVE  NEGATIVE Final    RADIOLOGY STUDIES/RESULTS: Mr Marisa [name] Contrast  12/31/2013   CLINICAL DATA:  Confusion.  Delirium.   EXAM: MRI HEAD WITHOUT AND WITH CONTRAST  TECHNIQUE: Multiplanar, multiecho pulse sequences of the brain and surrounding structures were obtained without and with intravenous contrast.  CONTRAST:  5 mL MultiHance.  COMPARISON:  CT head without contrast 10/21/2013. MRI brain without and with contrast 12/07/2008.  FINDINGS: No acute infarct, hemorrhage, or mass lesion is present. Moderate periventricular and scattered subcortical white matter changes bilaterally are similar to the prior study. The ventricles are of normal size. Flow is present in  the major intracranial arteries. No significant extra-axial fluid collection is present. Globes and orbits are intact. Midline structures are normal.  The postcontrast images demonstrate no pathologic enhancement.  IMPRESSION: 1. Stable age advanced white matter disease. This likely reflects the sequela of chronic microvascular ischemia. 2. No acute intracranial abnormality or significant interval change.   Electronically Signed   By: Lawrence Santiago M.D.   On: 12/31/2013 16:00   Ct Abdomen Pelvis W Contrast  01/04/2014   CLINICAL DATA:  Abdominal pain, diarrhea  EXAM: CT ABDOMEN AND PELVIS WITH CONTRAST  TECHNIQUE: Multidetector CT imaging of the abdomen and pelvis was performed using the standard protocol following bolus administration of intravenous contrast.  CONTRAST:  49mL OMNIPAQUE IOHEXOL 300 MG/ML  SOLN  COMPARISON:  06/06/2012  FINDINGS: The lung bases are clear. There is a right Bochdalek's hernia.  The liver demonstrates no focal abnormality. There is no intrahepatic or extrahepatic biliary ductal dilatation. The gallbladder is surgically absent. There are innumerable calcifications within the spleen likely reflecting sequela of prior granulomatous disease.There is a stable hypodense, fluid attenuating 1.6 cm left renal mass most consistent with a cyst. The right kidney, adrenal glands and pancreas are normal. There is mild bilateral nonspecific perinephric  stranding. The bladder is unremarkable.  The stomach, duodenum, small intestine, and large intestine demonstrate no contrast extravasation or dilatation. There is no pneumoperitoneum, pneumatosis, or portal venous gas. There is no abdominal or pelvic free fluid. There is no lymphadenopathy. The uterus is surgically absent. The ovaries are normal in size.  The abdominal aorta is normal in caliber with atherosclerosis.  There are no lytic or sclerotic osseous lesions. There is a chronic L1 mild compression fracture. There is diffuse lumbar spine spondylosis unchanged in the prior exam. There are chronic bilateral superior and inferior pubic rami fractures. There are bilateral pole of rib fractures. There is an old left L3 transverse process fracture.  IMPRESSION: 1. No acute abdominal or pelvic pathology. 2. Lumbar spine spondylosis.   Electronically Signed   By: Kathreen Devoid   On: 01/04/2014 16:59   Dg Chest Port 1 View  01/04/2014   CLINICAL DATA:  Dyspnea, weakness  EXAM: PORTABLE CHEST - 1 VIEW  COMPARISON:  10/04/2011  FINDINGS: Cardiomediastinal silhouette is stable. No acute infiltrate or pleural effusion. No pulmonary edema. Old right rib fracture is noted.  IMPRESSION: No active disease.   Electronically Signed   By: Lahoma Crocker M.D.   On: 01/04/2014 13:40    Karen Kitchens [facility] Pager:[phone]  If 7PM-7AM, please contact night-coverage [email or web address] Password [email or web address] 01/10/2014, 10:52 AM   LOS: 8 days   **Disclaimer: This note may have been dictated with voice recognition software. Similar sounding words can inadvertently be transcribed and this note may contain transcription errors which may not have been corrected upon publication of note.**  Patient was seen, examined,treatment plan was discussed with the  Advance Practice Provider.  I have directly reviewed the clinical findings, lab, imaging studies and management of this patient in detail. I have made the necessary  changes to the above noted documentation, and agree with the documentation, as recorded by the Advance Practice Provider.   In short,patient remains stable, CBG's on the higher side, remains at risk of hypoglycemia with dramatic increases in insulin regimen. Have adjusted Levemir, increased Amlodipine to 10 mg, awaiting Psych re-eval for disposition  Nena Alexander MD [facility].

## 2014-01-10 NOTE — Progress Notes (Addendum)
Pt CBG dropped from 492 at 1659 to 96 at 2116. On-call provider notified of significant drop and scheduled Levemir for 2200. Patient also given snack. Will continue to monitor.

## 2014-01-10 NOTE — Progress Notes (Signed)
Paged on-call provider at patient's request. Pt complains of increased anxiety. She is requesting medication to treat but does not have any PRN orders for anxiety. On-call provider gave order to repeat 0.5 mg dose of Lorazepam x1. Will continue to monitor.

## 2014-01-10 NOTE — Consult Note (Signed)
Psychiatry Consult follow up  Reason for Consult:  Psychosis and depression Referring Physician:  Dr. Cari Caraway is an 73 y.o. female. Total Time spent with patient: 30 minutes  Assessment: AXIS I:  Psychosis NOS. Rule out steroid induced psychosis AXIS II:  Deferred AXIS III:   Past Medical History  Diagnosis Date  . Diabetic ketoacidosis 11/2010  . Myocardial infarction     Chest pain s/p normal cath in 08/2004 then NSTEMI during 11/2010 admission, negative Myoview  . Diabetes mellitus type 2, insulin dependent     initial diagnoses 11/2008  . GERD (gastroesophageal reflux disease)   . Esophageal stricture 06/2004    Dilation 06/2004  . Hypertension   . Supraventricular tachycardia   . history of  pericarditis 12/2002  . Pericardial effusion   . Rheumatoid arthritis(714.0)     On MTX and chronic steroids  . Osteoarthritis   . Asthma     Home 3L O2   . Seizure 11/2008  . Rectal bleeding 11/2010    Large hemorrhoids  . Chronic anemia   . History of blood transfusion   . Diverticulosis   . Anemia   . Family history of anesthesia complication     DAUGHTER HAS NAUSEA   AXIS IV:  Medical illnesses, to include being on O2 on an ongoing basis, and Arthritis AXIS V:  31-40 impairment in reality testing  Plan: Case discussed with Dr. Sloan Leiter and staff RN Discontinue safety sitter  Continue Risperidone 0.25 mg PO BID for psychosis Continue Cymbalta 30 mg POn Qam for depressive symptoms  No evidence of imminent risk to self or others at present.   Patient does not meet criteria for psychiatric inpatient admission. Supportive therapy provided about ongoing stressors. Refer to out patient psych to [facility] or [facility] if needed  Appreciate psychiatric consultation Please contact [phone] if needs further assistance  Subjective:   Marisa [name] is a 73 y.o. female patient admitted with psychotic symptoms and depression.  Patient is seen today for for  psychiatric consultation follow up. Patient complained that she did not sleep well last night, this morning become more drowsy and unable to respond to staff and felt like she is going to fall down and slumping in her chair. Patient stated that she is feeling depressed about her life in general and has on going visual and auditory hallucinations. Patient appeared lying in her bed, with increased psychomotor activity and depression along with anxiety. Reportedly she has better day yesterday. She is awake, able to verbally respond during this visit. She has low dose of valproic acid which is non therapeutic and has no evidence of seizure at this visit and will recommend to discontinue until further evaluation. Patient has been taking risperidone and zoloft without adverse effects including extrapyramidal symptoms but has sedation and poor responds to zoloft. Patient has depressed mood with severe neuro-vegetative symptoms. She denies homicidal or violent ideations. Patient has no past psychiatric history including out patient or in patient treatment. She has denies alcohol or drug abuse.  Interval history: patient has been stable without symptoms of depression, anxiety, SI/HI and psychosis. Patient husband who is at bed side feels comfortable taking her home. She is contracting for safety and may be discharged on medically stable.   Past Psychiatric History: Past Medical History  Diagnosis Date  . Diabetic ketoacidosis 11/2010  . Myocardial infarction     Chest pain s/p normal cath in 08/2004 then NSTEMI during 11/2010 admission,  negative Myoview  . Diabetes mellitus type 2, insulin dependent     initial diagnoses 11/2008  . GERD (gastroesophageal reflux disease)   . Esophageal stricture 06/2004    Dilation 06/2004  . Hypertension   . Supraventricular tachycardia   . history of  pericarditis 12/2002  . Pericardial effusion   . Rheumatoid arthritis(714.0)     On MTX and chronic steroids  . Osteoarthritis    . Asthma     Home 3L O2   . Seizure 11/2008  . Rectal bleeding 11/2010    Large hemorrhoids  . Chronic anemia   . History of blood transfusion   . Diverticulosis   . Anemia   . Family history of anesthesia complication     DAUGHTER HAS NAUSEA    reports that she has never smoked. She has never used smokeless tobacco. She reports that she does not drink alcohol or use illicit drugs. Family History  Problem Relation Age of Onset  . Heart disease Mother   . Stroke Father      Living Arrangements: Spouse/significant other   Abuse/Neglect [facility]) Physical Abuse: Denies Verbal Abuse: Denies Sexual Abuse: Denies Allergies:   Allergies  Allergen Reactions  . Demerol [Meperidine] Other (See Comments)    Hallucinations  . Penicillins Swelling and Other (See Comments)    Throat swelling     Objective: Blood pressure 139/52, pulse 89, temperature 97.7 F (36.5 C), temperature source Oral, resp. rate 18, height $RemoveBe'4\' 11"'cznTybZZC$  (1.499 m), weight 49.896 kg (110 lb), SpO2 100.00%.Body mass index is 22.21 kg/(m^2). Results for orders placed during the hospital encounter of 01/02/14 (from the past 72 hour(s))  GLUCOSE, CAPILLARY     Status: Abnormal   Collection Time    01/07/14 11:53 AM      Result Value Ref Range   Glucose-Capillary 225 (*) 70 - 99 mg/dL  GLUCOSE, CAPILLARY     Status: Abnormal   Collection Time    01/07/14  4:55 PM      Result Value Ref Range   Glucose-Capillary 162 (*) 70 - 99 mg/dL  GLUCOSE, CAPILLARY     Status: Abnormal   Collection Time    01/07/14  9:38 PM      Result Value Ref Range   Glucose-Capillary 335 (*) 70 - 99 mg/dL   Comment 1 Notify RN    GLUCOSE, CAPILLARY     Status: Abnormal   Collection Time    01/08/14  7:58 AM      Result Value Ref Range   Glucose-Capillary 68 (*) 70 - 99 mg/dL  GLUCOSE, CAPILLARY     Status: Abnormal   Collection Time    01/08/14  8:26 AM      Result Value Ref Range   Glucose-Capillary 136 (*) 70 - 99 mg/dL  CLOSTRIDIUM  DIFFICILE BY PCR     Status: None   Collection Time    01/08/14 10:32 AM      Result Value Ref Range   C difficile by pcr NEGATIVE  NEGATIVE  GLUCOSE, CAPILLARY     Status: Abnormal   Collection Time    01/08/14 11:57 AM      Result Value Ref Range   Glucose-Capillary 327 (*) 70 - 99 mg/dL  GLUCOSE, CAPILLARY     Status: Abnormal   Collection Time    01/08/14  2:07 PM      Result Value Ref Range   Glucose-Capillary 251 (*) 70 - 99 mg/dL  GLUCOSE, CAPILLARY  Status: Abnormal   Collection Time    01/08/14  5:01 PM      Result Value Ref Range   Glucose-Capillary 180 (*) 70 - 99 mg/dL  GLUCOSE, CAPILLARY     Status: Abnormal   Collection Time    01/08/14  9:41 PM      Result Value Ref Range   Glucose-Capillary 199 (*) 70 - 99 mg/dL   Comment 1 Notify RN    GLUCOSE, CAPILLARY     Status: Abnormal   Collection Time    01/09/14  3:14 AM      Result Value Ref Range   Glucose-Capillary 61 (*) 70 - 99 mg/dL   Comment 1 Notify RN    GLUCOSE, CAPILLARY     Status: None   Collection Time    01/09/14  3:51 AM      Result Value Ref Range   Glucose-Capillary 98  70 - 99 mg/dL   Comment 1 Notify RN    GLUCOSE, CAPILLARY     Status: Abnormal   Collection Time    01/09/14  7:48 AM      Result Value Ref Range   Glucose-Capillary 184 (*) 70 - 99 mg/dL  GLUCOSE, CAPILLARY     Status: Abnormal   Collection Time    01/09/14 11:56 AM      Result Value Ref Range   Glucose-Capillary 307 (*) 70 - 99 mg/dL  GLUCOSE, CAPILLARY     Status: Abnormal   Collection Time    01/09/14  4:59 PM      Result Value Ref Range   Glucose-Capillary 341 (*) 70 - 99 mg/dL  GLUCOSE, CAPILLARY     Status: Abnormal   Collection Time    01/09/14  8:37 PM      Result Value Ref Range   Glucose-Capillary 354 (*) 70 - 99 mg/dL   Comment 1 Notify RN    GLUCOSE, CAPILLARY     Status: Abnormal   Collection Time    01/10/14 12:32 AM      Result Value Ref Range   Glucose-Capillary 348 (*) 70 - 99 mg/dL    Comment 1 Documented in Chart     Comment 2 Notify RN    GLUCOSE, CAPILLARY     Status: Abnormal   Collection Time    01/10/14  4:53 AM      Result Value Ref Range   Glucose-Capillary 303 (*) 70 - 99 mg/dL   Comment 1 Notify RN    BASIC METABOLIC PANEL     Status: Abnormal   Collection Time    01/10/14  6:50 AM      Result Value Ref Range   Sodium 135 (*) 137 - 147 mEq/L   Potassium 4.4  3.7 - 5.3 mEq/L   Chloride 93 (*) 96 - 112 mEq/L   CO2 32  19 - 32 mEq/L   Glucose, Bld 260 (*) 70 - 99 mg/dL   BUN 20  6 - 23 mg/dL   Creatinine, Ser 1.06  0.50 - 1.10 mg/dL   Calcium 8.6  8.4 - 10.5 mg/dL   GFR calc non Af Amer 51 (*) >90 mL/min   GFR calc Af Amer 59 (*) >90 mL/min   Comment: (NOTE)     The eGFR has been calculated using the CKD EPI equation.     This calculation has not been validated in all clinical situations.     eGFR's persistently <90 mL/min signify possible Chronic Kidney  Disease.   Anion gap 10  5 - 15  GLUCOSE, CAPILLARY     Status: Abnormal   Collection Time    01/10/14  7:37 AM      Result Value Ref Range   Glucose-Capillary 239 (*) 70 - 99 mg/dL  CBC     Status: Abnormal   Collection Time    01/10/14  9:12 AM      Result Value Ref Range   WBC 9.2  4.0 - 10.5 K/uL   RBC 3.12 (*) 3.87 - 5.11 MIL/uL   Hemoglobin 8.8 (*) 12.0 - 15.0 g/dL   HCT 27.6 (*) 36.0 - 46.0 %   MCV 88.5  78.0 - 100.0 fL   MCH 28.2  26.0 - 34.0 pg   MCHC 31.9  30.0 - 36.0 g/dL   RDW 14.0  11.5 - 15.5 %   Platelets 315  150 - 400 K/uL   Labs are reviewed and are pertinent for  Anemia, hyperglycemia Head MRI- 1. Stable age advanced white matter disease. This likely reflects  the sequela of chronic microvascular ischemia.  2. No acute intracranial abnormality or significant interval change.   Current Facility-Administered Medications  Medication Dose Route Frequency Provider Last Rate Last Dose  . acetaminophen (TYLENOL) tablet 650 mg  650 mg Oral Q4H PRN Varney Biles, MD    650 mg at 01/09/14 1656  . albuterol (PROVENTIL) (2.5 MG/3ML) 0.083% nebulizer solution 2.5 mg  2.5 mg Nebulization Q6H PRN Hosie Poisson, MD      . amLODipine (NORVASC) tablet 10 mg  10 mg Oral Daily Jonetta Osgood, MD   10 mg at 01/10/14 1003  . antiseptic oral rinse (CPC / CETYLPYRIDINIUM CHLORIDE 0.05%) solution 7 mL  7 mL Mouth Rinse BID Hosie Poisson, MD   7 mL at 01/10/14 1000  . diphenhydrAMINE (BENADRYL) capsule 25 mg  25 mg Oral Q4H PRN Jeryl Columbia, NP      . DULoxetine (CYMBALTA) DR capsule 30 mg  30 mg Oral Daily Jonetta Osgood, MD   30 mg at 01/10/14 1003  . enoxaparin (LOVENOX) injection 30 mg  30 mg Subcutaneous Q24H Anh P Pham, RPH   30 mg at 01/09/14 1749  . feeding supplement (ENSURE COMPLETE) (ENSURE COMPLETE) liquid 237 mL  237 mL Oral BID BM Charlesetta Shanks, MD   237 mL at 01/10/14 1049  . hydrALAZINE (APRESOLINE) injection 10 mg  10 mg Intravenous Q6H PRN Jonetta Osgood, MD      . hydrocortisone-pramoxine [facility]) rectal foam 1 applicator  1 applicator Rectal BID Melton Alar, PA-C   1 applicator at 56/38/75 6433  . insulin aspart (novoLOG) injection 0-9 Units  0-9 Units Subcutaneous TID WC Hosie Poisson, MD   3 Units at 01/10/14 0753  . insulin detemir (LEVEMIR) injection 10 Units  10 Units Subcutaneous Daily Jonetta Osgood, MD   10 Units at 01/10/14 1003  . insulin detemir (LEVEMIR) injection 7 Units  7 Units Subcutaneous QHS Shanker Kristeen Mans, MD      . LORazepam (ATIVAN) tablet 0.5 mg  0.5 mg Oral QHS Varney Biles, MD   0.5 mg at 01/09/14 2108  . LORazepam (ATIVAN) tablet 0.5 mg  0.5 mg Oral Q8H PRN Melton Alar, PA-C      . metoprolol succinate (TOPROL-XL) 24 hr tablet 100 mg  100 mg Oral Daily Ankit Nanavati, MD   100 mg at 01/10/14 1003  . ondansetron (ZOFRAN) tablet 4 mg  4 mg  Oral Q8H PRN Varney Biles, MD   4 mg at 01/07/14 0929  . pantoprazole (PROTONIX) EC tablet 40 mg  40 mg Oral Daily Ankit Nanavati, MD   40 mg at 01/10/14 1049  .  polyethylene glycol (MIRALAX / GLYCOLAX) packet 17 g  17 g Oral Daily Melton Alar, PA-C   17 g at 01/10/14 1003  . predniSONE (DELTASONE) tablet 10 mg  10 mg Oral Daily Varney Biles, MD   10 mg at 01/10/14 1003  . risperiDONE (RISPERDAL) tablet 0.25 mg  0.25 mg Oral BID Jonetta Osgood, MD   0.25 mg at 01/10/14 1003    Psychiatric Specialty Exam:     Blood pressure 139/52, pulse 89, temperature 97.7 F (36.5 C), temperature source Oral, resp. rate 18, height $RemoveBe'4\' 11"'jsmFiNDal$  (1.499 m), weight 49.896 kg (110 lb), SpO2 100.00%.Body mass index is 22.21 kg/(m^2).  General Appearance: Fairly Groomed  Engineer, water::  Good  Speech:  Normal Rate  Volume:  Normal  Mood:  Anxious, Depressed, Hopeless and Worthless  Affect:  Appropriate  Thought Process:  Goal Directed and Linear  Orientation:  Full (Time, Place, and Person)- patient is fully oriented x 3   Thought Content:  Delusions and Hallucinations: Auditory Visual  Suicidal Thoughts:  No  Homicidal Thoughts:  No  Memory:  recednt and remote grossly intact, recall 3/3 imm and 3/3 at 3 minutes   Judgement:  Impaired  Insight:  Lacking  Psychomotor Activity:  Normal  Concentration:  Good  Recall:  Good  Fund of Knowledge:Good  Language: Good  Akathisia:  No  Handed:  Right  AIMS (if indicated):     Assets:  Desire for Improvement Resilience  Sleep:      Musculoskeletal: Strength & Muscle Tone: describes joint pain, and has hand/wrist deformity related to chronic arthritis Gait & Station: gait not examined Patient leans: N/A  Treatment Plan Summary: 1. May discharge to out patient psych when medically stable 2. Continue Risperidone 0.25 mg BID for paranoia psychosis,  Continue Cymbalta 30 mg PO Qam for controlling depression  3. Based on history of gastric atrophy/iron malabsorption concerns, would consider obtaining B12, Folate, Vitamin D3, all of which can contribute to psychiatric illnesses if chronically deficient.     Bladimir Auman,JANARDHAHA R. 01/10/2014 11:48 AM

## 2014-01-10 NOTE — Progress Notes (Signed)
Pt called RN to room to talk. Patient stated " I am afraid of my husband." RN asked her what made her afraid, patient replied "I don't know. Sometimes I look at him and I see the man that I married and have been with for 36 years. Then, sometimes I look at him and he looks different to me. I am not sure what he's capable of." She went on to say " there was one time at home when all of the sudden I was on the floor in my bedroom, and then something hit me in the back of the head. I asked my husband what happened and he said he didn't know." Patient then requested to see someone from psychiatry. RN explained to patient that her husband has gone home for the night, she is not in any immediate danger now and encouraged the patient to try to relax. Same RN was assigned this patient last night and patient did not sleep at all over night. When discussing how she did not sleep last night patient stated " I know. Every time I tried to go to sleep I had these images of things flash in my head. I don't know if they are things that happened or not.". RN reiterated to patient that she is alone in her room and is in no immediate danger and assured her that a note would be passed on for the physician in the morning. Will continue to monitor.

## 2014-01-10 NOTE — Clinical Social Work Note (Signed)
CSW requested re-evaluation from psychiatry.  Last evaluation was 01/06/2014.    Nonnie Done, [place] [phone]  Psychiatric & Orthopedics (5N 1-16) Clinical Social Worker

## 2014-01-10 NOTE — Progress Notes (Signed)
Patient refusing to keep her NSL. RN was asked to take it out due to "severe itching".  Pt stated to RN she was going to "claw it out" if RN did not remove it. RN explained that if IV access was needed she would have to be stuck again. Patient acknowledged this but maintained that she wanted it removed. Will continue to monitor.

## 2014-01-10 NOTE — Clinical Social Work Psych Note (Signed)
Pt has been referred to the following facilities: [facility]: at capacity no beds available [facility]: at capacity no beds available [facility]: at capacity no beds available [facility]: at capacity- facility accepted referral [facility]: denied pt due to medical acuity [facility]: at capacity no beds available [facility]: at capacity no beds available [facility]: at capacity- pt under review for waitlist [facility]: at capacity no beds available-possible discharges this afternoon  Psych CSW has requested psychiatry to re-evaluate pt for possible SNF discharge today vs inpatient psychiatric placement.  Nonnie Done, [place] [phone]  Psychiatric & Orthopedics (5N 1-16) Clinical Social Worker

## 2014-01-10 NOTE — Progress Notes (Signed)
CARE MANAGEMENT NOTE 01/10/2014  Patient:  Marisa [name], Marisa [name]   Account Number:  [id number]  Date Initiated:  01/05/2014  Documentation initiated by:  Tomi Bamberger  Subjective/Objective Assessment:   dx hypoglycemia,  admit- lives with spouse.     Action/Plan:   pt eval-  psych consult.   Anticipated DC Date:  01/07/2014   Anticipated DC Plan:  [place] referral  Clinical Social Worker      DC Planning Services  CM consult      Choice offered to / List presented to:             Status of service:  In process, will continue to follow Medicare Important Message given?  YES (If response is "NO", the following Medicare IM given date fields will be blank) Date Medicare IM given:  01/05/2014 Medicare IM given by:  Tomi Bamberger Date Additional Medicare IM given:  01/10/2014 Additional Medicare IM given by:  [facility]  Discharge Disposition:    Per UR Regulation:  Reviewed for med. necessity/level of care/duration of stay  If discussed at [place] of Stay Meetings, dates discussed:    Comments:  01/07/14 [place], BSN [phone] awaiting psych inpatient bed. Psych CSW aware.  01/05/14 [facility], BSN [phone] psych consulted .  Patient is from psych facility.

## 2014-01-11 LAB — GLUCOSE, CAPILLARY
GLUCOSE-CAPILLARY: 164 mg/dL — AB (ref 70–99)
GLUCOSE-CAPILLARY: 295 mg/dL — AB (ref 70–99)
GLUCOSE-CAPILLARY: 159 mg/dL — AB (ref 70–99)
GLUCOSE-CAPILLARY: 226 mg/dL — AB (ref 70–99)
Glucose-Capillary: 56 mg/dL — ABNORMAL LOW (ref 70–99)
Glucose-Capillary: 73 mg/dL (ref 70–99)
Glucose-Capillary: 217 mg/dL — ABNORMAL HIGH (ref 70–99)
Glucose-Capillary: 307 mg/dL — ABNORMAL HIGH (ref 70–99)

## 2014-01-11 LAB — CREATININE, SERUM
Creatinine, Ser: 1.19 mg/dL — ABNORMAL HIGH (ref 0.50–1.10)
GFR calc Af Amer: 52 mL/min — ABNORMAL LOW (ref >90)
GFR calc non Af Amer: 44 mL/min — ABNORMAL LOW (ref >90)

## 2014-01-11 MED ORDER — AMLODIPINE BESYLATE 10 MG PO TABS: 10.0000 mg | ORAL_TABLET | Freq: Every day | ORAL | Status: DC

## 2014-01-11 MED ORDER — INSULIN DETEMIR 100 UNIT/ML ~~LOC~~ SOLN: 10.0000 [IU] | Freq: Every morning | SUBCUTANEOUS | Status: DC

## 2014-01-11 MED ORDER — LORAZEPAM 0.5 MG PO TABS: 0.5000 mg | ORAL_TABLET | Freq: Every day | ORAL | Status: DC

## 2014-01-11 MED ORDER — INSULIN ASPART 100 UNIT/ML ~~LOC~~ SOLN: SUBCUTANEOUS | Status: DC

## 2014-01-11 MED ORDER — INSULIN DETEMIR 100 UNIT/ML ~~LOC~~ SOLN
6.0000 [IU] | Freq: Every day | SUBCUTANEOUS | Status: DC
  Administered 2014-01-11: 6 [IU] via SUBCUTANEOUS
  Filled 2014-01-11: qty 0.06

## 2014-01-11 MED ORDER — DULOXETINE HCL 30 MG PO CPEP: 30.0000 mg | ORAL_CAPSULE | Freq: Every day | ORAL | Status: DC

## 2014-01-11 MED ORDER — RISPERIDONE 0.5 MG PO TABS: 0.2500 mg | ORAL_TABLET | Freq: Two times a day (BID) | ORAL | Status: DC

## 2014-01-11 MED ORDER — INSULIN DETEMIR 100 UNIT/ML ~~LOC~~ SOLN: 5.0000 [IU] | Freq: Every day | SUBCUTANEOUS | Status: DC

## 2014-01-11 MED ORDER — ENSURE COMPLETE PO LIQD: 237.0000 mL | Freq: Two times a day (BID) | ORAL | Status: DC

## 2014-01-11 MED ORDER — LORAZEPAM 0.5 MG PO TABS: 0.5000 mg | ORAL_TABLET | Freq: Three times a day (TID) | ORAL | Status: DC | PRN

## 2014-01-11 NOTE — Discharge Summary (Signed)
PATIENT DETAILS Name: Marisa [name] Age: 73 y.o. Sex: female Date of Birth: [birth year] MRN: [id number]. Admitting Physician: Hosie Poisson, MD RWE:[id number],[id number], MD  Admit Date: 01/02/2014 Discharge date: 01/11/2014  Recommendations for Outpatient Follow-up:  1. Please check CBGs periodically-still having a few episodes of hypoglycemia. Please adjust insulin regimen accordingly 2. 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 Will need psychiatric followup as outpatient  PRIMARY DISCHARGE DIAGNOSIS:  Active Problems:   Hypoglycemia   Acute encephalopathy      PAST MEDICAL HISTORY: Past Medical History  Diagnosis Date  . Diabetic ketoacidosis 11/2010  . Myocardial infarction     Chest pain s/p normal cath in 08/2004 then NSTEMI during 11/2010 admission, negative Myoview  . Diabetes mellitus type 2, insulin dependent     initial diagnoses 11/2008  . GERD (gastroesophageal reflux disease)   . Esophageal stricture 06/2004    Dilation 06/2004  . Hypertension   . Supraventricular tachycardia   . history of  pericarditis 12/2002  . Pericardial effusion   . Rheumatoid arthritis(714.0)     On MTX and chronic steroids  . Osteoarthritis   . Asthma     Home 3L O2   . Seizure 11/2008  . Rectal bleeding 11/2010    Large hemorrhoids  . Chronic anemia   . History of blood transfusion   . Diverticulosis   . Anemia   . Family history of anesthesia complication     DAUGHTER HAS NAUSEA    DISCHARGE MEDICATIONS:   Medication List    STOP taking these medications       lisinopril 20 MG tablet  Commonly known as:  PRINIVIL,ZESTRIL     potassium chloride SA 20 MEQ tablet  Commonly known as:  K-DUR,KLOR-CON     sertraline 50 MG tablet  Commonly known as:  ZOLOFT     traMADol 50 MG tablet  Commonly known as:  ULTRAM      TAKE these medications       acetaminophen 500 MG tablet  Commonly known as:  TYLENOL  Take 500 mg by mouth every 6 (six) hours as  needed.     albuterol (5 MG/ML) 0.5% nebulizer solution  Commonly known as:  PROVENTIL  Take 2.5 mg by nebulization every 6 (six) hours as needed for wheezing or shortness of breath.     AMBULATORY NON FORMULARY MEDICATION  Continuous O2 @@ 2.5-3 LMP     amLODipine 10 MG tablet  Commonly known as:  NORVASC  Take 1 tablet (10 mg total) by mouth daily.     clotrimazole-betamethasone cream  Commonly known as:  LOTRISONE  Apply 1 application topically 2 (two) times daily.     Co Q 10 100 MG Caps  Take 100 mg by mouth daily.     docusate sodium 100 MG capsule  Commonly known as:  COLACE  Take 100 mg by mouth daily.     DULoxetine 30 MG capsule  Commonly known as:  CYMBALTA  Take 1 capsule (30 mg total) by mouth daily.     esomeprazole 20 MG capsule  Commonly known as:  NEXIUM  Take 20 mg by mouth 2 (two) times daily.     estrogens (conjugated) 0.625 MG tablet  Commonly known as:  PREMARIN  Take 0.625 mg by mouth daily.     feeding supplement (ENSURE COMPLETE) Liqd  Take 237 mLs by mouth 2 (two) times daily between meals.     hydrocortisone 25 MG suppository  Commonly  known as:  ANUSOL-HC  Insert one suppository daily as needed for hemorrhoids.     insulin aspart 100 UNIT/ML injection  Commonly known as:  novoLOG  - 0-9 Units, Subcutaneous, 3 times daily with meals  - CBG < 70: implement hypoglycemia protocol  - CBG 70 - 120: 0 units  - CBG 121 - 150: 1 unit  - CBG 151 - 200: 2 units  - CBG 201 - 250: 3 units  - CBG 251 - 300: 5 units  - CBG 301 - 350: 7 units  - CBG 351 - 400: 9 units  - CBG > 400: call MD     insulin detemir 100 UNIT/ML injection  Commonly known as:  LEVEMIR  Inject 0.1 mLs (10 Units total) into the skin every morning.     insulin detemir 100 UNIT/ML injection  Commonly known as:  LEVEMIR  Inject 0.05 mLs (5 Units total) into the skin at bedtime.     levalbuterol 45 MCG/ACT inhaler  Commonly known as:  XOPENEX HFA  Inhale 1-2  puffs into the lungs every 4 (four) hours as needed for wheezing.     LORazepam 0.5 MG tablet  Commonly known as:  ATIVAN  Take 1 tablet (0.5 mg total) by mouth at bedtime.     LORazepam 0.5 MG tablet  Commonly known as:  ATIVAN  Take 1 tablet (0.5 mg total) by mouth every 8 (eight) hours as needed for anxiety or sleep.     LYRICA 75 MG capsule  Generic drug:  pregabalin  Take 75 mg by mouth daily.     MULTIVITAMINS PO  Take 1 tablet by mouth. Daily.     naproxen sodium 220 MG tablet  Commonly known as:  ANAPROX  Take 220 mg by mouth 2 (two) times daily with a meal.     predniSONE 10 MG tablet  Commonly known as:  DELTASONE  Take 10 mg by mouth daily.     promethazine 12.5 MG tablet  Commonly known as:  PHENERGAN  Take 12.5 mg by mouth every 6 (six) hours as needed for nausea.     risperiDONE 0.5 MG tablet  Commonly known as:  RISPERDAL  Take 0.5 tablets (0.25 mg total) by mouth 2 (two) times daily.     TOPROL XL 100 MG 24 hr tablet  Generic drug:  metoprolol succinate  Take 100 mg by mouth daily.     VITAMIN K2-VITAMIN D3 PO  Take 1 tablet by mouth daily.        ALLERGIES:   Allergies  Allergen Reactions  . Demerol [Meperidine] Other (See Comments)    Hallucinations  . Penicillins Swelling and Other (See Comments)    Throat swelling    BRIEF HPI:  See H&P, Labs, Consult and Test reports for all details in brief, patient is a 73 year old female with a history of diabetes, asthma who presented to the ED with auditory and visual hallucinations, and was noted to be hypoglycemic and admitted to the hospitalist service.  CONSULTATIONS:   None  PERTINENT RADIOLOGIC STUDIES: Ct Head Wo Contrast  01/06/2014   CLINICAL DATA:  Lethargy, difficulty eating are rows inpatient ; history of coronary artery disease and diabetes and asthma  EXAM: CT HEAD WITHOUT CONTRAST  TECHNIQUE: Contiguous axial images were obtained from the base of the skull through the vertex without  intravenous contrast.  COMPARISON:  Noncontrast CT scan of the brain dated October 21, 2013 and MRI the brain of December 21, 2013.  FINDINGS: The ventricles are normal in size and position. There is mild age appropriate cerebral atrophy. There is no intracranial hemorrhage nor intracranial mass effect. There is stable decreased density in the deep white matter of both cerebral hemispheres consistent with chronic small vessel ischemic change. A stable subcentimeter lacune is present in the mid body of the corpus callosum on the right. The cerebellum and brainstem are unremarkable.  The observed paranasal sinuses and mastoid air cells are clear. There is no acute skull fracture.  IMPRESSION: 1. There is no acute intracranial hemorrhage nor evidence of an acute ischemic infarct. 2. 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 3. There is no acute skull fracture.   Electronically Signed   By: David  Martinique   On: 01/06/2014 10:55   Mr Brain W Wo Contrast  12/31/2013   CLINICAL DATA:  Confusion.  Delirium.  EXAM: MRI HEAD WITHOUT AND WITH CONTRAST  TECHNIQUE: Multiplanar, multiecho pulse sequences of the brain and surrounding structures were obtained without and with intravenous contrast.  CONTRAST:  5 mL MultiHance.  COMPARISON:  CT head without contrast 10/21/2013. MRI brain without and with contrast 12/07/2008.  FINDINGS: No acute infarct, hemorrhage, or mass lesion is present. Moderate periventricular and scattered subcortical white matter changes bilaterally are similar to the prior study. The ventricles are of normal size. Flow is present in the major intracranial arteries. No significant extra-axial fluid collection is present. Globes and orbits are intact. Midline structures are normal.  The postcontrast images demonstrate no pathologic enhancement.  IMPRESSION: 1. Stable age advanced white matter disease. This likely  reflects the sequela of chronic microvascular ischemia. 2. No acute intracranial abnormality or significant interval change.   Electronically Signed   By: [place] Santiago M.D.   On: 12/31/2013 16:00   Ct Abdomen Pelvis W Contrast  01/04/2014   CLINICAL DATA:  Abdominal pain, diarrhea  EXAM: CT ABDOMEN AND PELVIS WITH CONTRAST  TECHNIQUE: Multidetector CT imaging of the abdomen and pelvis was performed using the standard protocol following bolus administration of intravenous contrast.  CONTRAST:  13mL OMNIPAQUE IOHEXOL 300 MG/ML  SOLN  COMPARISON:  06/06/2012  FINDINGS: The lung bases are clear. There is a right Bochdalek's hernia.  The liver demonstrates no focal abnormality. There is no intrahepatic or extrahepatic biliary ductal dilatation. The gallbladder is surgically absent. There are innumerable calcifications within the spleen likely reflecting sequela of prior granulomatous disease.There is a stable hypodense, fluid attenuating 1.6 cm left renal mass most consistent with a cyst. The right kidney, adrenal glands and pancreas are normal. There is mild bilateral nonspecific perinephric stranding. The bladder is unremarkable.  The stomach, duodenum, small intestine, and large intestine demonstrate no contrast extravasation or dilatation. There is no pneumoperitoneum, pneumatosis, or portal venous gas. There is no abdominal or pelvic free fluid. There is no lymphadenopathy. The uterus is surgically absent. The ovaries are normal in size.  The abdominal aorta is normal in caliber with atherosclerosis.  There are no lytic or sclerotic osseous lesions. There is a chronic L1 mild compression fracture. There is diffuse lumbar spine spondylosis unchanged in the prior exam. There are chronic bilateral superior and inferior pubic rami fractures. There are bilateral pole of rib fractures. There is an old left L3 transverse process fracture.  IMPRESSION: 1. No acute abdominal or pelvic pathology. 2. Lumbar spine  spondylosis.   Electronically Signed   By: Elbert Ewings  Patel   On: 01/04/2014 16:59   Dg Chest Port 1 View  01/04/2014   CLINICAL DATA:  Dyspnea, weakness  EXAM: PORTABLE CHEST - 1 VIEW  COMPARISON:  10/04/2011  FINDINGS: Cardiomediastinal silhouette is stable. No acute infiltrate or pleural effusion. No pulmonary edema. Old right rib fracture is noted.  IMPRESSION: No active disease.   Electronically Signed   By: Lahoma Crocker M.D.   On: 01/04/2014 13:40     PERTINENT LAB RESULTS: CBC:  Recent Labs  01/10/14 0912  WBC 9.2  HGB 8.8*  HCT 27.6*  PLT 315   CMET CMP     Component Value Date/Time   NA 135* 01/10/2014 0650   K 4.4 01/10/2014 0650   CL 93* 01/10/2014 0650   CO2 32 01/10/2014 0650   GLUCOSE 260* 01/10/2014 0650   BUN 20 01/10/2014 0650   CREATININE 1.19* 01/11/2014 0745   CALCIUM 8.6 01/10/2014 0650   PROT 5.5* 01/04/2014 1312   ALBUMIN 2.7* 01/04/2014 1312   AST 13 01/04/2014 1312   ALT 7 01/04/2014 1312   ALKPHOS 122* 01/04/2014 1312   BILITOT 0.3 01/04/2014 1312   GFRNONAA 44* 01/11/2014 0745   GFRAA 52* 01/11/2014 0745    GFR Estimated Creatinine Clearance: 29.1 ml/min (by C-G formula based on Cr of 1.19). No results found for this basename: LIPASE, AMYLASE,  in the last 72 hours No results found for this basename: CKTOTAL, CKMB, CKMBINDEX, TROPONINI,  in the last 72 hours No components found with this basename: POCBNP,  No results found for this basename: DDIMER,  in the last 72 hours No results found for this basename: HGBA1C,  in the last 72 hours No results found for this basename: CHOL, HDL, LDLCALC, TRIG, CHOLHDL, LDLDIRECT,  in the last 72 hours No results found for this basename: TSH, T4TOTAL, FREET3, T3FREE, THYROIDAB,  in the last 72 hours No results found for this basename: VITAMINB12, FOLATE, FERRITIN, TIBC, IRON, RETICCTPCT,  in the last 72 hours Coags: No results found for this basename: PT, INR,  in the last 72 hours Microbiology: Recent Results (from the  past 240 hour(s))  URINE CULTURE     Status: None   Collection Time    01/04/14 12:17 PM      Result Value Ref Range Status   Specimen Description URINE, CATHETERIZED   Final   Special Requests NONE   Final   Culture  Setup Time     Final   Value: 01/05/2014 08:38     Performed at [facility]     Final   Value: NO GROWTH     Performed at [facility]   Culture     Final   Value: NO GROWTH     Performed at [facility]   Report Status 01/06/2014 FINAL   Final  CLOSTRIDIUM DIFFICILE BY PCR     Status: None   Collection Time    01/08/14 10:32 AM      Result Value Ref Range Status   C difficile by pcr NEGATIVE  NEGATIVE Final     BRIEF HOSPITAL COURSE:   Active Problems: Hypoglycemia  - Secondary to poor oral intake. 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 Please consider referral to endocrinology in the outpatient setting.  Acute Encephalopathy  -  resolved with changes to medication regimen  -suspect related to medications/polypharmacy. CT head neg. ABG without significant hypercarbia.   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  - Continue with Levemir -change to 10 units in am, 5 units in pm, continue with SSI  Psychosis  - Initially brought to the emergency room for hallucinations-seen by psychiatry-recommended inpatient psychiatric evaluation.  - had a period of lethargy on 9/25. CT head neg, ABG without significant hypercarbia. Medications adjusted.  - Spoke with psych, stopped Depakote, changed Risperidal to 0.25 mg BID. Zoloft changed to Cymbalta. Patient significantly improved, no longer hallucinating, seen by psychiatry on followup on 9/28-no longer  recommending geriatric psychiatry placement. Will need psychiatric followup as outpatient  ARF  -Resolved.  -stop Lisinopril, gently hydrate, recheck lytes prn while at SNF  Hypertension  - Moderately controlled with metoprolol, add Amlodipine   History of rheumatoid arthritis  - Stable-on chronic steroids   History of anemia  - Chronic issue, currently stable. Monitor periodically.   History of coronary disease  - Currently stable.   History of bronchial asthma  - Lungs clear, currently stable  - PRN Nebs   Chronic Resp Failure:  -on home O2-2.5L/m-continue  TODAY-DAY OF DISCHARGE:  Subjective:   Kaelani Kendrick today has no headache,no chest abdominal pain,no new weakness tingling or numbness, feels much better  Objective:   Blood pressure 150/56, pulse 98, temperature 98.3 F (36.8 C), temperature source Oral, resp. rate 24, height 4\' 11"  (1.499 m), weight 49.896 kg (110 lb), SpO2 100.00%.  Intake/Output Summary (Last 24 hours) at 01/11/14 1127 Last data filed at 01/10/14 1300  Gross per 24 hour  Intake    480 ml  Output      0 ml  Net    480 ml   Filed Weights   01/02/14 1939  Weight: 49.896 kg (110 lb)    Exam Awake Alert, Oriented *3, No new F.N deficits, Normal affect [place].AT,PERRAL Supple Neck,No JVD, No cervical lymphadenopathy appriciated.  Symmetrical Chest wall movement, Good air movement bilaterally, CTAB RRR,No Gallops,Rubs or new Murmurs, No Parasternal Heave +ve B.Sounds, Abd Soft, Non tender, No organomegaly appriciated, No rebound -guarding or rigidity. No Cyanosis, Clubbing or edema, No new Rash or bruise  DISCHARGE CONDITION: Stable  DISPOSITION: SNF  DISCHARGE INSTRUCTIONS:    Activity:  As tolerated with Full fall precautions use walker/cane & assistance as needed  Diet recommendation: Diabetic Diet Heart Healthy diet      Discharge Instructions   Call MD for:  difficulty breathing, headache or visual disturbances     Complete by:  As directed      Call MD for:  redness, tenderness, or signs of infection (pain, swelling, redness, odor or green/yellow discharge around incision site)    Complete by:  As directed      Diet - low sodium heart healthy    Complete by:  As directed      Diet general    Complete by:  As directed      Increase activity slowly    Complete by:  As directed            Follow-up Information   Follow up with PANG,RICHARD, MD. Schedule an appointment as soon as possible for a visit in 1 week.   Specialty:  Internal Medicine   Contact information:   [address] [place] [address] [phone]  Total Time spent on discharge equals 45 minutes.  SignedOren Binet 01/11/2014 11:27 AM  **Disclaimer: This note may have been dictated with voice recognition software. Similar sounding words can inadvertently be transcribed and this note may contain transcription errors which may not have been corrected upon publication of note.**

## 2014-01-11 NOTE — Clinical Social Work Placement (Addendum)
Clinical Social Work Department CLINICAL SOCIAL WORK PLACEMENT NOTE 01/11/2014  Patient:  Marisa [name], Marisa [name]  Account Number:  [id number] Admit date:  01/02/2014  Clinical Social Worker:  Kemper Durie, [place]  Date/time:  01/07/2014 08:23 PM  Clinical Social Work is seeking post-discharge placement for this patient at the following level of care:   SKILLED NURSING   (*CSW will update this form in Epic as items are completed)   01/07/2014  Patient/family provided with [facility] Department of Clinical Social Work's list of facilities offering this level of care within the geographic area requested by the patient (or if unable, by the patient's family).  01/07/2014  Patient/family informed of their freedom to choose among providers that offer the needed level of care, that participate in Medicare, Medicaid or managed care program needed by the patient, have an available bed and are willing to accept the patient.  01/07/2014  Patient/family informed of [facility]' ownership interest in [facility] as well as of the fact that they are under no obligation to receive care at this facility.  PASARR submitted to EDS on 01/07/2014 PASARR number received on   FL2 transmitted to all facilities in geographic area requested by pt/family on  01/07/2014 FL2 transmitted to all facilities within larger geographic area on   Patient informed that his/her managed care company has contracts with or will negotiate with  certain facilities, including the following:     Patient/family informed of bed offers received:  01/11/2014 Patient chooses bed at [facility] Physician recommends and patient chooses bed at    Patient to be transferred to [facility] on  01/11/2014 Patient to be transferred to facility by Ambulance Patient and family notified of transfer on 01/11/2014 Name of family member notified:  Ray  The following physician request were entered in Epic:   Additional  Comments:   Per MD patient ready for DC to [facility]. RN, patient, patient's family, and facility notified of DC. RN given number for report. DC packet on chart. [facility] transport requested for patient. CSW signing off.     Patient has stated that she refuses to go to SNF and is stating that she will return home today with her husband. CSW, MD, and admissions person from [facility] have met to speak with patient regarding the benefits of SNF, but patient adamantly refuses. CSW has updated RNCM by voicemail. CSW signing off at this time.    Liz Beach MSW, [place], [place], [phone]

## 2014-01-11 NOTE — Progress Notes (Signed)
Physical Therapy Treatment Patient Details Name: Marisa [name] MRN: [id number] DOB: 1940-08-27 Today's Date: 01/11/2014    History of Present Illness Patient is a 73 yo female admitted with Hypoglycemia and acute encephalopathy.      PT Comments    Patient appears very anxious regarding stay in hospital and possible DC plan. Required increased encouragement to participate with some ambulation. Once out in hallway, patient appear almost "spooked" and request to go back to bed as she did not feel comfortable. Patients husband was present in room and stated that she had been anxious most of the day  Follow Up Recommendations  SNF;Supervision/Assistance - 24 hour (planning on psych placement)     Equipment Recommendations  None recommended by PT    Recommendations for Other Services       Precautions / Restrictions Precautions Precautions: Fall    Mobility  Bed Mobility Overal bed mobility: Modified Independent                Transfers Overall transfer level: Needs assistance Equipment used: Straight cane Transfers: Sit to/from Stand Sit to Stand: Min guard         General transfer comment: assist for safety  Ambulation/Gait Ambulation/Gait assistance: Min guard Ambulation Distance (Feet): 50 Feet Assistive device: Straight cane Gait Pattern/deviations: Step-through pattern;Decreased stride length;Trunk flexed;Narrow base of support   Gait velocity interpretation: Below normal speed for age/gender General Gait Details: Patient limtied by anxiety and fear, wanting to return to room once out in hallway and resting   Stairs            Wheelchair Mobility    Modified Rankin (Stroke Patients Only)       Balance                                    Cognition   Behavior During Therapy: Anxious Overall Cognitive Status: Impaired/Different from baseline Area of Impairment: Memory;Safety/judgement;Awareness     Memory: Decreased  short-term memory   Safety/Judgement: Decreased awareness of safety;Decreased awareness of deficits     General Comments: Patient being followed by psych for placement. See previous RN and MD notes for insight.     Exercises      General Comments        Pertinent Vitals/Pain Pain Assessment: No/denies pain    Home Living                      Prior Function            PT Goals (current goals can now be found in the care plan section) Progress towards PT goals: Progressing toward goals    Frequency  Min 2X/week    PT Plan Discharge plan needs to be updated    Co-evaluation             End of Session Equipment Utilized During Treatment: Oxygen Activity Tolerance: Other (comment) (limited by anxiety)       Time: 6295-2841 PT Time Calculation (min): 11 min  Charges:  $Gait Training: 8-22 mins                    G Codes:      Jacqualyn Posey 01/11/2014, 2:15 PM 01/11/2014 Jacqualyn Posey PTA [phone] pager [phone] office

## 2014-01-12 DIAGNOSIS — D62 Acute posthemorrhagic anemia: Secondary | ICD-10-CM

## 2014-01-12 DIAGNOSIS — J309 Allergic rhinitis, unspecified: Secondary | ICD-10-CM

## 2014-01-12 LAB — GLUCOSE, CAPILLARY
GLUCOSE-CAPILLARY: 427 mg/dL — AB (ref 70–99)
Glucose-Capillary: 108 mg/dL — ABNORMAL HIGH (ref 70–99)
Glucose-Capillary: 121 mg/dL — ABNORMAL HIGH (ref 70–99)
Glucose-Capillary: 169 mg/dL — ABNORMAL HIGH (ref 70–99)
Glucose-Capillary: 43 mg/dL — CL (ref 70–99)
Glucose-Capillary: 59 mg/dL — ABNORMAL LOW (ref 70–99)

## 2014-01-12 LAB — HEMOGLOBIN AND HEMATOCRIT, BLOOD
HCT: 29.4 % — ABNORMAL LOW (ref 36.0–46.0)
Hemoglobin: 9.3 g/dL — ABNORMAL LOW (ref 12.0–15.0)

## 2014-01-12 MED ORDER — ENOXAPARIN SODIUM 30 MG/0.3ML ~~LOC~~ SOLN
30.0000 mg | SUBCUTANEOUS | Status: DC
  Filled 2014-01-12: qty 0.3

## 2014-01-12 MED ORDER — INSULIN DETEMIR 100 UNIT/ML ~~LOC~~ SOLN: 10.0000 [IU] | Freq: Every day | SUBCUTANEOUS | Status: DC

## 2014-01-12 MED ORDER — GLUCOSE 40 % PO GEL
ORAL | Status: AC
  Filled 2014-01-12: qty 1

## 2014-01-12 MED ORDER — GLUCOSE 40 % PO GEL
1.0000 | ORAL | Status: DC | PRN
  Administered 2014-01-12: 37.5 g via ORAL

## 2014-01-12 NOTE — Consult Note (Signed)
Psychiatry Consult follow up  Reason for Consult:  Psychosis and depression Referring Physician:  Dr. Cari Caraway is an 73 y.o. female. Total Time spent with patient: 30 minutes  Assessment: AXIS I:  Psychosis NOS. Rule out steroid induced psychosis AXIS II:  Deferred AXIS III:   Past Medical History  Diagnosis Date  . Diabetic ketoacidosis 11/2010  . Myocardial infarction     Chest pain s/p normal cath in 08/2004 then NSTEMI during 11/2010 admission, negative Myoview  . Diabetes mellitus type 2, insulin dependent     initial diagnoses 11/2008  . GERD (gastroesophageal reflux disease)   . Esophageal stricture 06/2004    Dilation 06/2004  . Hypertension   . Supraventricular tachycardia   . history of  pericarditis 12/2002  . Pericardial effusion   . Rheumatoid arthritis(714.0)     On MTX and chronic steroids  . Osteoarthritis   . Asthma     Home 3L O2   . Seizure 11/2008  . Rectal bleeding 11/2010    Large hemorrhoids  . Chronic anemia   . History of blood transfusion   . Diverticulosis   . Anemia   . Family history of anesthesia complication     DAUGHTER HAS NAUSEA   AXIS IV:  Medical illnesses, to include being on O2 on an ongoing basis, and Arthritis AXIS V:  31-40 impairment in reality testing  Plan: Continue Risperidone 0.25 mg PO BID for psychosis Continue Cymbalta 30 mg POn Qam for depressive symptoms Continue Ativan 0.5 mg Qhs and BID/PRN No evidence of imminent risk to self or others at present.   Patient does not meet criteria for psychiatric inpatient admission. Supportive therapy provided about ongoing stressors. Refer to out patient psych to [facility] or [facility] if needed  Appreciate psychiatric consult Please contact [phone] if needs further assistance  Subjective:   Marisa [name] is a 73 y.o. female patient admitted with psychotic symptoms and depression.  Patient is seen today for for psychiatric consultation follow up. Patient  complained that she did not sleep well last night, this morning become more drowsy and unable to respond to staff and felt like she is going to fall down and slumping in her chair. Patient stated that she is feeling depressed about her life in general and has on going visual and auditory hallucinations. Patient appeared lying in her bed, with increased psychomotor activity and depression along with anxiety. Reportedly she has better day yesterday. She is awake, able to verbally respond during this visit. She has low dose of valproic acid which is non therapeutic and has no evidence of seizure at this visit and will recommend to discontinue until further evaluation. Patient has been taking risperidone and zoloft without adverse effects including extrapyramidal symptoms but has sedation and poor responds to zoloft. Patient has depressed mood with severe neuro-vegetative symptoms. She denies homicidal or violent ideations. Patient has no past psychiatric history including out patient or in patient treatment. She has denies alcohol or drug abuse.  Interval history: Patient appeared staying in her bed, awake, alert, oriented x 3 and continues to be anxious and has unknown fear but able to feel comfortable with her family and does not want to be placed in SNF. She has been stable clinically with current medication management and denies  symptoms of depression, SI/HI and A/V hallucination and has mild paranoid. Patient husband who is at bed side feels comfortable taking her home and follow up with out patient  care. She is contracting for safety and may be discharge today as per patient staff RN and physician.    Past Psychiatric History: Past Medical History  Diagnosis Date  . Diabetic ketoacidosis 11/2010  . Myocardial infarction     Chest pain s/p normal cath in 08/2004 then NSTEMI during 11/2010 admission, negative Myoview  . Diabetes mellitus type 2, insulin dependent     initial diagnoses 11/2008  . GERD  (gastroesophageal reflux disease)   . Esophageal stricture 06/2004    Dilation 06/2004  . Hypertension   . Supraventricular tachycardia   . history of  pericarditis 12/2002  . Pericardial effusion   . Rheumatoid arthritis(714.0)     On MTX and chronic steroids  . Osteoarthritis   . Asthma     Home 3L O2   . Seizure 11/2008  . Rectal bleeding 11/2010    Large hemorrhoids  . Chronic anemia   . History of blood transfusion   . Diverticulosis   . Anemia   . Family history of anesthesia complication     DAUGHTER HAS NAUSEA    reports that she has never smoked. She has never used smokeless tobacco. She reports that she does not drink alcohol or use illicit drugs. Family History  Problem Relation Age of Onset  . Heart disease Mother   . Stroke Father      Living Arrangements: Spouse/significant other   Abuse/Neglect [facility]) Physical Abuse: Denies Verbal Abuse: Denies Sexual Abuse: Denies Allergies:   Allergies  Allergen Reactions  . Demerol [Meperidine] Other (See Comments)    Hallucinations  . Penicillins Swelling and Other (See Comments)    Throat swelling     Objective: Blood pressure 132/65, pulse 108, temperature 98.1 F (36.7 C), temperature source Oral, resp. rate 18, height _0  (1.499 m), weight 49.896 kg (110 lb), SpO2 100.00%.Body mass index is 22.21 kg/(m^2). Results for orders placed during the hospital encounter of 01/02/14 (from the past 72 hour(s))  GLUCOSE, CAPILLARY     Status: Abnormal   Collection Time    01/09/14 11:56 AM      Result Value Ref Range   Glucose-Capillary 307 (*) 70 - 99 mg/dL  GLUCOSE, CAPILLARY     Status: Abnormal   Collection Time    01/09/14  4:59 PM      Result Value Ref Range   Glucose-Capillary 341 (*) 70 - 99 mg/dL  GLUCOSE, CAPILLARY     Status: Abnormal   Collection Time    01/09/14  8:37 PM      Result Value Ref Range   Glucose-Capillary 354 (*) 70 - 99 mg/dL   Comment 1 Notify RN    GLUCOSE, CAPILLARY     Status:  Abnormal   Collection Time    01/10/14 12:32 AM      Result Value Ref Range   Glucose-Capillary 348 (*) 70 - 99 mg/dL   Comment 1 Documented in Chart     Comment 2 Notify RN    GLUCOSE, CAPILLARY     Status: Abnormal   Collection Time    01/10/14  4:53 AM      Result Value Ref Range   Glucose-Capillary 303 (*) 70 - 99 mg/dL   Comment 1 Notify RN    BASIC METABOLIC PANEL     Status: Abnormal   Collection Time    01/10/14  6:50 AM      Result Value Ref Range   Sodium 135 (*) 137 - 147 mEq/L  Potassium 4.4  3.7 - 5.3 mEq/L   Chloride 93 (*) 96 - 112 mEq/L   CO2 32  19 - 32 mEq/L   Glucose, Bld 260 (*) 70 - 99 mg/dL   BUN 20  6 - 23 mg/dL   Creatinine, Ser 1.06  0.50 - 1.10 mg/dL   Calcium 8.6  8.4 - 10.5 mg/dL   GFR calc non Af Amer 51 (*) >90 mL/min   GFR calc Af Amer 59 (*) >90 mL/min   Comment: (NOTE)     The eGFR has been calculated using the CKD EPI equation.     This calculation has not been validated in all clinical situations.     eGFR's persistently <90 mL/min signify possible Chronic Kidney     Disease.   Anion gap 10  5 - 15  GLUCOSE, CAPILLARY     Status: Abnormal   Collection Time    01/10/14  7:37 AM      Result Value Ref Range   Glucose-Capillary 239 (*) 70 - 99 mg/dL  CBC     Status: Abnormal   Collection Time    01/10/14  9:12 AM      Result Value Ref Range   WBC 9.2  4.0 - 10.5 K/uL   RBC 3.12 (*) 3.87 - 5.11 MIL/uL   Hemoglobin 8.8 (*) 12.0 - 15.0 g/dL   HCT 27.6 (*) 36.0 - 46.0 %   MCV 88.5  78.0 - 100.0 fL   MCH 28.2  26.0 - 34.0 pg   MCHC 31.9  30.0 - 36.0 g/dL   RDW 14.0  11.5 - 15.5 %   Platelets 315  150 - 400 K/uL  GLUCOSE, CAPILLARY     Status: Abnormal   Collection Time    01/10/14 11:52 AM      Result Value Ref Range   Glucose-Capillary 229 (*) 70 - 99 mg/dL  GLUCOSE, CAPILLARY     Status: Abnormal   Collection Time    01/10/14  4:59 PM      Result Value Ref Range   Glucose-Capillary 492 (*) 70 - 99 mg/dL  GLUCOSE, CAPILLARY      Status: None   Collection Time    01/10/14  9:16 PM      Result Value Ref Range   Glucose-Capillary 96  70 - 99 mg/dL   Comment 1 Documented in Chart     Comment 2 Notify RN    GLUCOSE, CAPILLARY     Status: Abnormal   Collection Time    01/10/14 11:03 PM      Result Value Ref Range   Glucose-Capillary 142 (*) 70 - 99 mg/dL  GLUCOSE, CAPILLARY     Status: Abnormal   Collection Time    01/11/14  5:59 AM      Result Value Ref Range   Glucose-Capillary 56 (*) 70 - 99 mg/dL   Comment 1 Documented in Chart     Comment 2 Notify RN    GLUCOSE, CAPILLARY     Status: None   Collection Time    01/11/14  6:26 AM      Result Value Ref Range   Glucose-Capillary 73  70 - 99 mg/dL   Comment 1 Documented in Chart     Comment 2 Notify RN    GLUCOSE, CAPILLARY     Status: Abnormal   Collection Time    01/11/14  7:42 AM      Result Value Ref Range   Glucose-Capillary 164 (*)  70 - 99 mg/dL  CREATININE, SERUM     Status: Abnormal   Collection Time    01/11/14  7:45 AM      Result Value Ref Range   Creatinine, Ser 1.19 (*) 0.50 - 1.10 mg/dL   GFR calc non Af Amer 44 (*) >90 mL/min   GFR calc Af Amer 52 (*) >90 mL/min   Comment: (NOTE)     The eGFR has been calculated using the CKD EPI equation.     This calculation has not been validated in all clinical situations.     eGFR's persistently <90 mL/min signify possible Chronic Kidney     Disease.  GLUCOSE, CAPILLARY     Status: Abnormal   Collection Time    01/11/14 12:00 PM      Result Value Ref Range   Glucose-Capillary 217 (*) 70 - 99 mg/dL  GLUCOSE, CAPILLARY     Status: Abnormal   Collection Time    01/11/14  1:55 PM      Result Value Ref Range   Glucose-Capillary 307 (*) 70 - 99 mg/dL   Comment 1 Call MD NNP PA CNM    GLUCOSE, CAPILLARY     Status: Abnormal   Collection Time    01/11/14  2:48 PM      Result Value Ref Range   Glucose-Capillary 295 (*) 70 - 99 mg/dL  GLUCOSE, CAPILLARY     Status: Abnormal   Collection Time     01/11/14  5:54 PM      Result Value Ref Range   Glucose-Capillary 159 (*) 70 - 99 mg/dL  GLUCOSE, CAPILLARY     Status: Abnormal   Collection Time    01/11/14  9:20 PM      Result Value Ref Range   Glucose-Capillary 226 (*) 70 - 99 mg/dL   Comment 1 Documented in Chart     Comment 2 Notify RN    GLUCOSE, CAPILLARY     Status: Abnormal   Collection Time    01/12/14 12:58 AM      Result Value Ref Range   Glucose-Capillary 121 (*) 70 - 99 mg/dL  GLUCOSE, CAPILLARY     Status: Abnormal   Collection Time    01/12/14  3:28 AM      Result Value Ref Range   Glucose-Capillary 43 (*) 70 - 99 mg/dL  GLUCOSE, CAPILLARY     Status: Abnormal   Collection Time    01/12/14  3:45 AM      Result Value Ref Range   Glucose-Capillary 59 (*) 70 - 99 mg/dL  GLUCOSE, CAPILLARY     Status: Abnormal   Collection Time    01/12/14  4:13 AM      Result Value Ref Range   Glucose-Capillary 108 (*) 70 - 99 mg/dL  HEMOGLOBIN AND HEMATOCRIT, BLOOD     Status: Abnormal   Collection Time    01/12/14  7:54 AM      Result Value Ref Range   Hemoglobin 9.3 (*) 12.0 - 15.0 g/dL   HCT 29.4 (*) 36.0 - 46.0 %  GLUCOSE, CAPILLARY     Status: Abnormal   Collection Time    01/12/14  8:23 AM      Result Value Ref Range   Glucose-Capillary 169 (*) 70 - 99 mg/dL   Labs are reviewed and are pertinent for  Anemia, hyperglycemia Head MRI- 1. Stable age advanced white matter disease. This likely reflects  the sequela of chronic microvascular ischemia.  2. No acute intracranial abnormality or significant interval change.   Current Facility-Administered Medications  Medication Dose Route Frequency Provider Last Rate Last Dose  . acetaminophen (TYLENOL) tablet 650 mg  650 mg Oral Q4H PRN Varney Biles, MD   650 mg at 01/09/14 1656  . albuterol (PROVENTIL) (2.5 MG/3ML) 0.083% nebulizer solution 2.5 mg  2.5 mg Nebulization Q6H PRN Hosie Poisson, MD      . amLODipine (NORVASC) tablet 10 mg  10 mg Oral Daily Jonetta Osgood, MD   10 mg at 01/12/14 0950  . antiseptic oral rinse (CPC / CETYLPYRIDINIUM CHLORIDE 0.05%) solution 7 mL  7 mL Mouth Rinse BID Hosie Poisson, MD   7 mL at 01/12/14 1000  . dextrose (GLUTOSE) 40 % oral gel 37.5 g  1 Tube Oral PRN Jonetta Osgood, MD   37.5 g at 01/12/14 0349  . diphenhydrAMINE (BENADRYL) capsule 25 mg  25 mg Oral Q4H PRN Jeryl Columbia, NP      . DULoxetine (CYMBALTA) DR capsule 30 mg  30 mg Oral Daily Jonetta Osgood, MD   30 mg at 01/12/14 0950  . enoxaparin (LOVENOX) injection 30 mg  30 mg Subcutaneous Q24H [place], [place]      . feeding supplement (ENSURE COMPLETE) (ENSURE COMPLETE) liquid 237 mL  237 mL Oral BID BM Charlesetta Shanks, MD   237 mL at 01/12/14 0958  . hydrALAZINE (APRESOLINE) injection 10 mg  10 mg Intravenous Q6H PRN Jonetta Osgood, MD      . hydrocortisone-pramoxine [facility]) rectal foam 1 applicator  1 applicator Rectal BID Melton Alar, PA-C   1 applicator at 61/44/31 5400  . insulin aspart (novoLOG) injection 0-9 Units  0-9 Units Subcutaneous TID WC Hosie Poisson, MD   2 Units at 01/12/14 [phone]  . insulin detemir (LEVEMIR) injection 10 Units  10 Units Subcutaneous Daily Jonetta Osgood, MD   10 Units at 01/12/14 [phone]  . LORazepam (ATIVAN) tablet 0.5 mg  0.5 mg Oral QHS Ankit Nanavati, MD   0.5 mg at 01/11/14 2134  . LORazepam (ATIVAN) tablet 0.5 mg  0.5 mg Oral Q8H PRN Melton Alar, PA-C      . metoprolol succinate (TOPROL-XL) 24 hr tablet 100 mg  100 mg Oral Daily Ankit Nanavati, MD   100 mg at 01/12/14 0950  . ondansetron (ZOFRAN) tablet 4 mg  4 mg Oral Q8H PRN Varney Biles, MD   4 mg at 01/10/14 1230  . pantoprazole (PROTONIX) EC tablet 40 mg  40 mg Oral Daily Varney Biles, MD   40 mg at 01/12/14 0951  . polyethylene glycol (MIRALAX / GLYCOLAX) packet 17 g  17 g Oral Daily Melton Alar, PA-C   17 g at 01/10/14 1003  . predniSONE (DELTASONE) tablet 10 mg  10 mg Oral Daily Varney Biles, MD   10 mg at  01/12/14 0950  . risperiDONE (RISPERDAL) tablet 0.25 mg  0.25 mg Oral BID Jonetta Osgood, MD   0.25 mg at 01/12/14 0950    Psychiatric Specialty Exam:     Blood pressure 132/65, pulse 108, temperature 98.1 F (36.7 C), temperature source Oral, resp. rate 18, height _0  (1.499 m), weight 49.896 kg (110 lb), SpO2 100.00%.Body mass index is 22.21 kg/(m^2).  General Appearance: Fairly Groomed  Engineer, water::  Good  Speech:  Normal Rate  Volume:  Normal  Mood:  Anxious  Affect:  Appropriate  Thought Process:  Goal Directed and Linear  Orientation:  Full (Time, Place, and Person)- patient is fully oriented x 3   Thought Content:  WDL  Suicidal Thoughts:  No  Homicidal Thoughts:  No  Memory:  recednt and remote grossly intact, recall 3/3 imm and 3/3 at 3 minutes   Judgement:  Fair  Insight:  Fair  Psychomotor Activity:  Normal  Concentration:  Good  Recall:  Good  Fund of Knowledge:Good  Language: Good  Akathisia:  No  Handed:  Right  AIMS (if indicated):     Assets:  Communication Skills Desire for Improvement Financial Resources/Insurance Housing Intimacy Leisure Time Resilience Social Support Transportation  Sleep:      Musculoskeletal: Strength & Muscle Tone: decreased Gait & Station: unable to stand Patient leans: N/A  Treatment Plan Summary: 1. May discharge to out patient psych for follow up 2. Continue Risperidone 0.25 mg BID for paranoia psychosis,  3. Continue Cymbalta 30 mg PO Qam for controlling depression  4. Continue Ativan 0.5 mg PO Qhs and BID/PRN for anxiety and insomnia  Omeka Holben,JANARDHAHA R. 01/12/2014 11:33 AM

## 2014-01-12 NOTE — Progress Notes (Signed)
Hypoglycemic Event  CBG: 59  Treatment: 1 tube instant glucose  Symptoms: Sweaty and Shaky  Follow-up CBG: [id number] CBG Result108 Possible Reasons for Event: Unknown  Comments/MD notified:Kirby, NP    [place] Silos, Royalty Fakhouri L  Remember to initiate Hypoglycemia Order Set & complete

## 2014-01-12 NOTE — Progress Notes (Signed)
Bright red blood noted in patient's stool. Pt stated "it's not the first time since I've been here, it's a little more than before. It might be my hemorrhoids." Provider on call notified.

## 2014-01-12 NOTE — Progress Notes (Signed)
Patients CBG 427 at 1221. MD Elmahi verbal order to give 10 units of Novolog at this time. MD verbal okay for patient to go home. Patient education provided and snack provided for patient for the road.

## 2014-01-12 NOTE — Progress Notes (Addendum)
Hypoglycemic Event  CBG: 43  Treatment: 15 GM carbohydrate snack  Symptoms: Pale and Shaky  Follow-up CBG: Time: 0345 CBG Result: 59  Possible Reasons for Event: Unknown  Comments/MD notified:    [place] Silos, Tinzley Dalia L  Remember to initiate Hypoglycemia Order Set & complete

## 2014-01-12 NOTE — Discharge Summary (Signed)
PATIENT DETAILS Name: Marisa [name] Age: 73 y.o. Sex: female Date of Birth: 1940/06/21 MRN: [id number]. Admitting Physician: Hosie Poisson, MD PRX:YVOP,FYTWKMQ, MD  Admit Date: 01/02/2014 Discharge date: 01/12/2014  Recommendations for Outpatient Follow-up:  1. Please check CBGs at least 2-3 times a day, and adjust insulin regimen accordingly 2. 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 Will need psychiatric followup as outpatient.  PRIMARY DISCHARGE DIAGNOSIS:  Active Problems:   Hypoglycemia   Acute encephalopathy      PAST MEDICAL HISTORY: Past Medical History  Diagnosis Date  . Diabetic ketoacidosis 11/2010  . Myocardial infarction     Chest pain s/p normal cath in 08/2004 then NSTEMI during 11/2010 admission, negative Myoview  . Diabetes mellitus type 2, insulin dependent     initial diagnoses 11/2008  . GERD (gastroesophageal reflux disease)   . Esophageal stricture 06/2004    Dilation 06/2004  . Hypertension   . Supraventricular tachycardia   . history of  pericarditis 12/2002  . Pericardial effusion   . Rheumatoid arthritis(714.0)     On MTX and chronic steroids  . Osteoarthritis   . Asthma     Home 3L O2   . Seizure 11/2008  . Rectal bleeding 11/2010    Large hemorrhoids  . Chronic anemia   . History of blood transfusion   . Diverticulosis   . Anemia   . Family history of anesthesia complication     DAUGHTER HAS NAUSEA    DISCHARGE MEDICATIONS:   Medication List    STOP taking these medications       lisinopril 20 MG tablet  Commonly known as:  PRINIVIL,ZESTRIL     potassium chloride SA 20 MEQ tablet  Commonly known as:  K-DUR,KLOR-CON     sertraline 50 MG tablet  Commonly known as:  ZOLOFT     traMADol 50 MG tablet  Commonly known as:  ULTRAM      TAKE these medications       acetaminophen 500 MG tablet  Commonly known as:  TYLENOL  Take 500 mg by mouth every 6 (six) hours as needed.     albuterol (5 MG/ML) 0.5%  nebulizer solution  Commonly known as:  PROVENTIL  Take 2.5 mg by nebulization every 6 (six) hours as needed for wheezing or shortness of breath.     AMBULATORY NON FORMULARY MEDICATION  Continuous O2 @@ 2.5-3 LMP     amLODipine 10 MG tablet  Commonly known as:  NORVASC  Take 1 tablet (10 mg total) by mouth daily.     clotrimazole-betamethasone cream  Commonly known as:  LOTRISONE  Apply 1 application topically 2 (two) times daily.     Co Q 10 100 MG Caps  Take 100 mg by mouth daily.     docusate sodium 100 MG capsule  Commonly known as:  COLACE  Take 100 mg by mouth daily.     DULoxetine 30 MG capsule  Commonly known as:  CYMBALTA  Take 1 capsule (30 mg total) by mouth daily.     esomeprazole 20 MG capsule  Commonly known as:  NEXIUM  Take 20 mg by mouth 2 (two) times daily.     estrogens (conjugated) 0.625 MG tablet  Commonly known as:  PREMARIN  Take 0.625 mg by mouth daily.     feeding supplement (ENSURE COMPLETE) Liqd  Take 237 mLs by mouth 2 (two) times daily between meals.     hydrocortisone 25 MG suppository  Commonly known  as:  ANUSOL-HC  Insert one suppository daily as needed for hemorrhoids.     insulin aspart 100 UNIT/ML injection  Commonly known as:  novoLOG  - 0-9 Units, Subcutaneous, 3 times daily with meals  - CBG < 70: implement hypoglycemia protocol  - CBG 70 - 120: 0 units  - CBG 121 - 150: 1 unit  - CBG 151 - 200: 2 units  - CBG 201 - 250: 3 units  - CBG 251 - 300: 5 units  - CBG 301 - 350: 7 units  - CBG 351 - 400: 9 units  - CBG > 400: call MD     insulin detemir 100 UNIT/ML injection  Commonly known as:  LEVEMIR  Inject 0.1 mLs (10 Units total) into the skin daily at 12 noon.     levalbuterol 45 MCG/ACT inhaler  Commonly known as:  XOPENEX HFA  Inhale 1-2 puffs into the lungs every 4 (four) hours as needed for wheezing.     LORazepam 0.5 MG tablet  Commonly known as:  ATIVAN  Take 1 tablet (0.5 mg total) by mouth at  bedtime.     LORazepam 0.5 MG tablet  Commonly known as:  ATIVAN  Take 1 tablet (0.5 mg total) by mouth every 8 (eight) hours as needed for anxiety or sleep.     LYRICA 75 MG capsule  Generic drug:  pregabalin  Take 75 mg by mouth daily.     MULTIVITAMINS PO  Take 1 tablet by mouth. Daily.     naproxen sodium 220 MG tablet  Commonly known as:  ANAPROX  Take 220 mg by mouth 2 (two) times daily with a meal.     predniSONE 10 MG tablet  Commonly known as:  DELTASONE  Take 10 mg by mouth daily.     promethazine 12.5 MG tablet  Commonly known as:  PHENERGAN  Take 12.5 mg by mouth every 6 (six) hours as needed for nausea.     risperiDONE 0.5 MG tablet  Commonly known as:  RISPERDAL  Take 0.5 tablets (0.25 mg total) by mouth 2 (two) times daily.     TOPROL XL 100 MG 24 hr tablet  Generic drug:  metoprolol succinate  Take 100 mg by mouth daily.     VITAMIN K2-VITAMIN D3 PO  Take 1 tablet by mouth daily.        ALLERGIES:   Allergies  Allergen Reactions  . Demerol [Meperidine] Other (See Comments)    Hallucinations  . Penicillins Swelling and Other (See Comments)    Throat swelling    BRIEF HPI:  See H&P, Labs, Consult and Test reports for all details in brief, patient is a 73 year old female with a history of diabetes, asthma who presented to the ED with auditory and visual hallucinations, and was noted to be hypoglycemic and admitted to the hospitalist service.  CONSULTATIONS:   None  PERTINENT RADIOLOGIC STUDIES: Ct Head Wo Contrast  01/06/2014   CLINICAL DATA:  Lethargy, difficulty eating are rows inpatient ; history of coronary artery disease and diabetes and asthma  EXAM: CT HEAD WITHOUT CONTRAST  TECHNIQUE: Contiguous axial images were obtained from the base of the skull through the vertex without intravenous contrast.  COMPARISON:  Noncontrast CT scan of the brain dated October 21, 2013 and MRI the brain of December 21, 2013.  FINDINGS: The ventricles are normal in  size and position. There is mild age appropriate cerebral atrophy. There is no intracranial hemorrhage nor intracranial mass effect.  There is stable decreased density in the deep white matter of both cerebral hemispheres consistent with chronic small vessel ischemic change. A stable subcentimeter lacune is present in the mid body of the corpus callosum on the right. The cerebellum and brainstem are unremarkable.  The observed paranasal sinuses and mastoid air cells are clear. There is no acute skull fracture.  IMPRESSION: 1. There is no acute intracranial hemorrhage nor evidence of an acute ischemic infarct. 2. 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 3. There is no acute skull fracture.   Electronically Signed   By: David  Martinique   On: 01/06/2014 10:55   Mr Brain W Wo Contrast  12/31/2013   CLINICAL DATA:  Confusion.  Delirium.  EXAM: MRI HEAD WITHOUT AND WITH CONTRAST  TECHNIQUE: Multiplanar, multiecho pulse sequences of the brain and surrounding structures were obtained without and with intravenous contrast.  CONTRAST:  5 mL MultiHance.  COMPARISON:  CT head without contrast 10/21/2013. MRI brain without and with contrast 12/07/2008.  FINDINGS: No acute infarct, hemorrhage, or mass lesion is present. Moderate periventricular and scattered subcortical white matter changes bilaterally are similar to the prior study. The ventricles are of normal size. Flow is present in the major intracranial arteries. No significant extra-axial fluid collection is present. Globes and orbits are intact. Midline structures are normal.  The postcontrast images demonstrate no pathologic enhancement.  IMPRESSION: 1. Stable age advanced white matter disease. This likely reflects the sequela of chronic microvascular ischemia. 2. No acute intracranial abnormality or significant interval change.   Electronically Signed   By: Lawrence Santiago M.D.    On: 12/31/2013 16:00   Ct Abdomen Pelvis W Contrast  01/04/2014   CLINICAL DATA:  Abdominal pain, diarrhea  EXAM: CT ABDOMEN AND PELVIS WITH CONTRAST  TECHNIQUE: Multidetector CT imaging of the abdomen and pelvis was performed using the standard protocol following bolus administration of intravenous contrast.  CONTRAST:  37mL OMNIPAQUE IOHEXOL 300 MG/ML  SOLN  COMPARISON:  06/06/2012  FINDINGS: The lung bases are clear. There is a right Bochdalek's hernia.  The liver demonstrates no focal abnormality. There is no intrahepatic or extrahepatic biliary ductal dilatation. The gallbladder is surgically absent. There are innumerable calcifications within the spleen likely reflecting sequela of prior granulomatous disease.There is a stable hypodense, fluid attenuating 1.6 cm left renal mass most consistent with a cyst. The right kidney, adrenal glands and pancreas are normal. There is mild bilateral nonspecific perinephric stranding. The bladder is unremarkable.  The stomach, duodenum, small intestine, and large intestine demonstrate no contrast extravasation or dilatation. There is no pneumoperitoneum, pneumatosis, or portal venous gas. There is no abdominal or pelvic free fluid. There is no lymphadenopathy. The uterus is surgically absent. The ovaries are normal in size.  The abdominal aorta is normal in caliber with atherosclerosis.  There are no lytic or sclerotic osseous lesions. There is a chronic L1 mild compression fracture. There is diffuse lumbar spine spondylosis unchanged in the prior exam. There are chronic bilateral superior and inferior pubic rami fractures. There are bilateral pole of rib fractures. There is an old left L3 transverse process fracture.  IMPRESSION: 1. No acute abdominal or pelvic pathology. 2. Lumbar spine spondylosis.   Electronically Signed   By: Kathreen Devoid   On: 01/04/2014 16:59   Dg Chest Port 1 View  01/04/2014   CLINICAL DATA:  Dyspnea, weakness  EXAM: PORTABLE CHEST -  1 VIEW   COMPARISON:  10/04/2011  FINDINGS: Cardiomediastinal silhouette is stable. No acute infiltrate or pleural effusion. No pulmonary edema. Old right rib fracture is noted.  IMPRESSION: No active disease.   Electronically Signed   By: Lahoma Crocker M.D.   On: 01/04/2014 13:40     PERTINENT LAB RESULTS: CBC:  Recent Labs  01/10/14 0912 01/12/14 0754  WBC 9.2  --   HGB 8.8* 9.3*  HCT 27.6* 29.4*  PLT 315  --    CMET CMP     Component Value Date/Time   NA 135* 01/10/2014 0650   K 4.4 01/10/2014 0650   CL 93* 01/10/2014 0650   CO2 32 01/10/2014 0650   GLUCOSE 260* 01/10/2014 0650   BUN 20 01/10/2014 0650   CREATININE 1.19* 01/11/2014 0745   CALCIUM 8.6 01/10/2014 0650   PROT 5.5* 01/04/2014 1312   ALBUMIN 2.7* 01/04/2014 1312   AST 13 01/04/2014 1312   ALT 7 01/04/2014 1312   ALKPHOS 122* 01/04/2014 1312   BILITOT 0.3 01/04/2014 1312   GFRNONAA 44* 01/11/2014 0745   GFRAA 52* 01/11/2014 0745    GFR Estimated Creatinine Clearance: 29.1 ml/min (by C-G formula based on Cr of 1.19). No results found for this basename: LIPASE, AMYLASE,  in the last 72 hours No results found for this basename: CKTOTAL, CKMB, CKMBINDEX, TROPONINI,  in the last 72 hours No components found with this basename: POCBNP,  No results found for this basename: DDIMER,  in the last 72 hours No results found for this basename: HGBA1C,  in the last 72 hours No results found for this basename: CHOL, HDL, LDLCALC, TRIG, CHOLHDL, LDLDIRECT,  in the last 72 hours No results found for this basename: TSH, T4TOTAL, FREET3, T3FREE, THYROIDAB,  in the last 72 hours No results found for this basename: VITAMINB12, FOLATE, FERRITIN, TIBC, IRON, RETICCTPCT,  in the last 72 hours Coags: No results found for this basename: PT, INR,  in the last 72 hours Microbiology: Recent Results (from the past 240 hour(s))  URINE CULTURE     Status: None   Collection Time    01/04/14 12:17 PM      Result Value Ref Range Status   Specimen Description  URINE, CATHETERIZED   Final   Special Requests NONE   Final   Culture  Setup Time     Final   Value: 01/05/2014 08:38     Performed at [facility]     Final   Value: NO GROWTH     Performed at [facility]   Culture     Final   Value: NO GROWTH     Performed at [facility]   Report Status 01/06/2014 FINAL   Final  CLOSTRIDIUM DIFFICILE BY PCR     Status: None   Collection Time    01/08/14 10:32 AM      Result Value Ref Range Status   C difficile by pcr NEGATIVE  NEGATIVE Final     BRIEF HOSPITAL COURSE:   Hypoglycemia  - Secondary to poor oral intake. 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 Please consider referral to endocrinology in the outpatient setting. -Patient discharged on 10 units of Levemir insulin, patient will check CBGs and take her sugar log to her PCP visit. -Please  adjust insulin regimen accordingly.  Acute Encephalopathy  -resolved with changes to medication regimen  -suspect related to medications/polypharmacy. CT head neg. ABG without significant hypercarbia.   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  - Continue with Levemir 10 units in the morning, needs close followup rule out permissive hyperglycemia  Psychosis  - Initially brought to the emergency room for hallucinations-seen by psychiatry-recommended inpatient psychiatric evaluation.  - had a period of lethargy on 9/25. CT head neg, ABG without significant hypercarbia. Medications adjusted.  - Spoke with psych, stopped Depakote, changed Risperidal to 0.25 mg BID. Zoloft changed to Cymbalta. Patient significantly improved, no longer hallucinating, seen by psychiatry on followup on  9/28-no longer recommending geriatric psychiatry placement. Will need psychiatric followup as outpatient  ARF  -Resolved.  -stop Lisinopril, gently hydrate, recheck lytes prn while at SNF  Hypertension  - Moderately controlled with metoprolol, add Amlodipine   History of rheumatoid arthritis  - Stable-on chronic steroids   History of anemia  - Chronic issue, currently stable. Monitor periodically.   History of coronary disease  - Currently stable.   History of bronchial asthma  - Lungs clear, currently stable  - PRN Nebs   Chronic Resp Failure:  -on home O2-2.5L/m-continue  TODAY-DAY OF DISCHARGE:  Subjective:   Denelle Capurro today has no headache,no chest abdominal pain,no new weakness tingling or numbness, feels much better  Objective:   Blood pressure 132/65, pulse 108, temperature 98.1 F (36.7 C), temperature source Oral, resp. rate 18, height 4\' 11"  (1.499 m), weight 49.896 kg (110 lb), SpO2 100.00%.  Intake/Output Summary (Last 24 hours) at 01/12/14 1149 Last data filed at 01/12/14 0830  Gross per 24 hour  Intake    220 ml  Output      0 ml  Net    220 ml   Filed Weights   01/02/14 1939  Weight: 49.896 kg (110 lb)    Exam Awake Alert, Oriented *3, No new F.N deficits, Normal affect [place].AT,PERRAL Supple Neck,No JVD, No cervical lymphadenopathy appriciated.  Symmetrical Chest wall movement, Good air movement bilaterally, CTAB RRR,No Gallops,Rubs or new Murmurs, No Parasternal Heave +ve B.Sounds, Abd Soft, Non tender, No organomegaly appriciated, No rebound -guarding or rigidity. No Cyanosis, Clubbing or edema, No new Rash or bruise  DISCHARGE CONDITION: Stable  DISPOSITION: SNF  DISCHARGE INSTRUCTIONS:    Activity:  As tolerated with Full fall precautions use walker/cane & assistance as needed  Diet recommendation: Diabetic Diet Heart Healthy diet  Discharge Instructions   Call MD for:  difficulty breathing, headache or visual disturbances     Complete by:  As directed      Call MD for:  redness, tenderness, or signs of infection (pain, swelling, redness, odor or green/yellow discharge around incision site)    Complete by:  As directed      Diet - low sodium heart healthy    Complete by:  As directed      Diet Carb Modified    Complete by:  As directed      Diet general    Complete by:  As directed      Increase activity slowly    Complete by:  As directed      Increase activity slowly    Complete by:  As directed            Follow-up Information   Follow up with Tommy Medal, MD. Schedule an  appointment as soon as possible for a visit in 1 week.   Specialty:  Internal Medicine   Contact information:   [address], [place] [address] [phone]         Total Time spent on discharge equals 45 minutes.  SignedVerlee Monte A 01/12/2014 11:49 AM  **Disclaimer: This note may have been dictated with voice recognition software. Similar sounding words can inadvertently be transcribed and this note may contain transcription errors which may not have been corrected upon publication of note.**

## 2014-01-12 NOTE — Progress Notes (Signed)
NURSING PROGRESS NOTE  Marisa [name] [id number] Discharge Data: 01/12/2014 2:00 PM Attending Provider: No att. providers found VCB:SWHQ,PRFFMBW, MD     Michaelle Copas to be D/C'd Home per MD order.  Discussed with the patient the After Visit Summary and all questions fully answered. All belongings returned to patient for patient to take home.   Last Vital Signs:  Blood pressure 132/65, pulse 108, temperature 98.1 F (36.7 C), temperature source Oral, resp. rate 18, height 4\' 11"  (1.499 m), weight 49.896 kg (110 lb), SpO2 100.00%.  Discharge Medication List   Medication List    STOP taking these medications       lisinopril 20 MG tablet  Commonly known as:  PRINIVIL,ZESTRIL     potassium chloride SA 20 MEQ tablet  Commonly known as:  K-DUR,KLOR-CON     sertraline 50 MG tablet  Commonly known as:  ZOLOFT     traMADol 50 MG tablet  Commonly known as:  ULTRAM      TAKE these medications       acetaminophen 500 MG tablet  Commonly known as:  TYLENOL  Take 500 mg by mouth every 6 (six) hours as needed.     albuterol (5 MG/ML) 0.5% nebulizer solution  Commonly known as:  PROVENTIL  Take 2.5 mg by nebulization every 6 (six) hours as needed for wheezing or shortness of breath.     AMBULATORY NON FORMULARY MEDICATION  Continuous O2 @@ 2.5-3 LMP     amLODipine 10 MG tablet  Commonly known as:  NORVASC  Take 1 tablet (10 mg total) by mouth daily.     clotrimazole-betamethasone cream  Commonly known as:  LOTRISONE  Apply 1 application topically 2 (two) times daily.     Co Q 10 100 MG Caps  Take 100 mg by mouth daily.     docusate sodium 100 MG capsule  Commonly known as:  COLACE  Take 100 mg by mouth daily.     DULoxetine 30 MG capsule  Commonly known as:  CYMBALTA  Take 1 capsule (30 mg total) by mouth daily.     esomeprazole 20 MG capsule  Commonly known as:  NEXIUM  Take 20 mg by mouth 2 (two) times daily.     estrogens (conjugated) 0.625 MG tablet   Commonly known as:  PREMARIN  Take 0.625 mg by mouth daily.     feeding supplement (ENSURE COMPLETE) Liqd  Take 237 mLs by mouth 2 (two) times daily between meals.     hydrocortisone 25 MG suppository  Commonly known as:  ANUSOL-HC  Insert one suppository daily as needed for hemorrhoids.     insulin aspart 100 UNIT/ML injection  Commonly known as:  novoLOG  - 0-9 Units, Subcutaneous, 3 times daily with meals  - CBG < 70: implement hypoglycemia protocol  - CBG 70 - 120: 0 units  - CBG 121 - 150: 1 unit  - CBG 151 - 200: 2 units  - CBG 201 - 250: 3 units  - CBG 251 - 300: 5 units  - CBG 301 - 350: 7 units  - CBG 351 - 400: 9 units  - CBG > 400: call MD     insulin detemir 100 UNIT/ML injection  Commonly known as:  LEVEMIR  Inject 0.1 mLs (10 Units total) into the skin daily at 12 noon.     levalbuterol 45 MCG/ACT inhaler  Commonly known as:  XOPENEX HFA  Inhale 1-2 puffs into the lungs every 4 (  four) hours as needed for wheezing.     LORazepam 0.5 MG tablet  Commonly known as:  ATIVAN  Take 1 tablet (0.5 mg total) by mouth at bedtime.     LORazepam 0.5 MG tablet  Commonly known as:  ATIVAN  Take 1 tablet (0.5 mg total) by mouth every 8 (eight) hours as needed for anxiety or sleep.     LYRICA 75 MG capsule  Generic drug:  pregabalin  Take 75 mg by mouth daily.     MULTIVITAMINS PO  Take 1 tablet by mouth. Daily.     naproxen sodium 220 MG tablet  Commonly known as:  ANAPROX  Take 220 mg by mouth 2 (two) times daily with a meal.     predniSONE 10 MG tablet  Commonly known as:  DELTASONE  Take 10 mg by mouth daily.     promethazine 12.5 MG tablet  Commonly known as:  PHENERGAN  Take 12.5 mg by mouth every 6 (six) hours as needed for nausea.     risperiDONE 0.5 MG tablet  Commonly known as:  RISPERDAL  Take 0.5 tablets (0.25 mg total) by mouth 2 (two) times daily.     TOPROL XL 100 MG 24 hr tablet  Generic drug:  metoprolol succinate  Take 100 mg  by mouth daily.     VITAMIN K2-VITAMIN D3 PO  Take 1 tablet by mouth daily.         Wallie Renshaw, RN

## 2014-01-12 NOTE — Progress Notes (Signed)
Patient stated  "I know I've got to go home and deal with it." RN asked for clarification. Pt states "Going home with my husband, I dont want to but i know i have to . I have to see how he's upkeeping our things. I'm glad they will be sending a nurse home with [place]." Pt is alert and oriented x 4. Pt became tearful, "I just know this is going to be a burden on my husband and I don't know how he will do it...he might get angry with me and then I don't know what he will do." RN asked if patient felt safe going home, pt stated "yes since someone will be there with me." Pt states, "my children tell me it's the medication i take that makes me think these things about my husband. But I don't know." Pt informed RN That she feels safe now and is grateful for the care she has received during her stay. Will continue to monitor patient.

## 2014-01-14 DIAGNOSIS — I471 Supraventricular tachycardia: Secondary | ICD-10-CM | POA: Diagnosis not present

## 2014-01-14 DIAGNOSIS — Z9981 Dependence on supplemental oxygen: Secondary | ICD-10-CM | POA: Diagnosis not present

## 2014-01-14 DIAGNOSIS — M069 Rheumatoid arthritis, unspecified: Secondary | ICD-10-CM | POA: Diagnosis not present

## 2014-01-14 DIAGNOSIS — J45909 Unspecified asthma, uncomplicated: Secondary | ICD-10-CM | POA: Diagnosis not present

## 2014-01-14 DIAGNOSIS — M1990 Unspecified osteoarthritis, unspecified site: Secondary | ICD-10-CM | POA: Diagnosis not present

## 2014-01-14 DIAGNOSIS — I10 Essential (primary) hypertension: Secondary | ICD-10-CM | POA: Diagnosis not present

## 2014-01-14 DIAGNOSIS — Z794 Long term (current) use of insulin: Secondary | ICD-10-CM | POA: Diagnosis not present

## 2014-01-14 DIAGNOSIS — E11649 Type 2 diabetes mellitus with hypoglycemia without coma: Secondary | ICD-10-CM | POA: Diagnosis not present

## 2014-01-14 DIAGNOSIS — D649 Anemia, unspecified: Secondary | ICD-10-CM | POA: Diagnosis not present

## 2014-01-14 DIAGNOSIS — G9340 Encephalopathy, unspecified: Secondary | ICD-10-CM | POA: Diagnosis not present

## 2014-01-17 DIAGNOSIS — E11649 Type 2 diabetes mellitus with hypoglycemia without coma: Secondary | ICD-10-CM | POA: Diagnosis not present

## 2014-01-17 DIAGNOSIS — J45909 Unspecified asthma, uncomplicated: Secondary | ICD-10-CM | POA: Diagnosis not present

## 2014-01-17 DIAGNOSIS — I471 Supraventricular tachycardia: Secondary | ICD-10-CM | POA: Diagnosis not present

## 2014-01-17 DIAGNOSIS — I10 Essential (primary) hypertension: Secondary | ICD-10-CM | POA: Diagnosis not present

## 2014-01-17 DIAGNOSIS — D649 Anemia, unspecified: Secondary | ICD-10-CM | POA: Diagnosis not present

## 2014-01-17 DIAGNOSIS — G9340 Encephalopathy, unspecified: Secondary | ICD-10-CM | POA: Diagnosis not present

## 2014-01-19 DIAGNOSIS — E11649 Type 2 diabetes mellitus with hypoglycemia without coma: Secondary | ICD-10-CM | POA: Diagnosis not present

## 2014-01-19 DIAGNOSIS — D649 Anemia, unspecified: Secondary | ICD-10-CM | POA: Diagnosis not present

## 2014-01-19 DIAGNOSIS — G9340 Encephalopathy, unspecified: Secondary | ICD-10-CM | POA: Diagnosis not present

## 2014-01-19 DIAGNOSIS — I471 Supraventricular tachycardia: Secondary | ICD-10-CM | POA: Diagnosis not present

## 2014-01-19 DIAGNOSIS — I10 Essential (primary) hypertension: Secondary | ICD-10-CM | POA: Diagnosis not present

## 2014-01-19 DIAGNOSIS — J45909 Unspecified asthma, uncomplicated: Secondary | ICD-10-CM | POA: Diagnosis not present

## 2014-01-20 DIAGNOSIS — I471 Supraventricular tachycardia: Secondary | ICD-10-CM | POA: Diagnosis not present

## 2014-01-20 DIAGNOSIS — J45909 Unspecified asthma, uncomplicated: Secondary | ICD-10-CM | POA: Diagnosis not present

## 2014-01-20 DIAGNOSIS — D649 Anemia, unspecified: Secondary | ICD-10-CM | POA: Diagnosis not present

## 2014-01-20 DIAGNOSIS — G9340 Encephalopathy, unspecified: Secondary | ICD-10-CM | POA: Diagnosis not present

## 2014-01-20 DIAGNOSIS — I10 Essential (primary) hypertension: Secondary | ICD-10-CM | POA: Diagnosis not present

## 2014-01-20 DIAGNOSIS — E11649 Type 2 diabetes mellitus with hypoglycemia without coma: Secondary | ICD-10-CM | POA: Diagnosis not present

## 2014-01-21 DIAGNOSIS — J45909 Unspecified asthma, uncomplicated: Secondary | ICD-10-CM | POA: Diagnosis not present

## 2014-01-21 DIAGNOSIS — G9340 Encephalopathy, unspecified: Secondary | ICD-10-CM | POA: Diagnosis not present

## 2014-01-21 DIAGNOSIS — E11649 Type 2 diabetes mellitus with hypoglycemia without coma: Secondary | ICD-10-CM | POA: Diagnosis not present

## 2014-01-21 DIAGNOSIS — I10 Essential (primary) hypertension: Secondary | ICD-10-CM | POA: Diagnosis not present

## 2014-01-21 DIAGNOSIS — D649 Anemia, unspecified: Secondary | ICD-10-CM | POA: Diagnosis not present

## 2014-01-21 DIAGNOSIS — I471 Supraventricular tachycardia: Secondary | ICD-10-CM | POA: Diagnosis not present

## 2014-01-24 DIAGNOSIS — G9340 Encephalopathy, unspecified: Secondary | ICD-10-CM | POA: Diagnosis not present

## 2014-01-24 DIAGNOSIS — I10 Essential (primary) hypertension: Secondary | ICD-10-CM | POA: Diagnosis not present

## 2014-01-24 DIAGNOSIS — D649 Anemia, unspecified: Secondary | ICD-10-CM | POA: Diagnosis not present

## 2014-01-24 DIAGNOSIS — I471 Supraventricular tachycardia: Secondary | ICD-10-CM | POA: Diagnosis not present

## 2014-01-24 DIAGNOSIS — E11649 Type 2 diabetes mellitus with hypoglycemia without coma: Secondary | ICD-10-CM | POA: Diagnosis not present

## 2014-01-24 DIAGNOSIS — J45909 Unspecified asthma, uncomplicated: Secondary | ICD-10-CM | POA: Diagnosis not present

## 2014-01-25 DIAGNOSIS — I471 Supraventricular tachycardia: Secondary | ICD-10-CM | POA: Diagnosis not present

## 2014-01-25 DIAGNOSIS — G9340 Encephalopathy, unspecified: Secondary | ICD-10-CM | POA: Diagnosis not present

## 2014-01-25 DIAGNOSIS — E11649 Type 2 diabetes mellitus with hypoglycemia without coma: Secondary | ICD-10-CM | POA: Diagnosis not present

## 2014-01-25 DIAGNOSIS — J45909 Unspecified asthma, uncomplicated: Secondary | ICD-10-CM | POA: Diagnosis not present

## 2014-01-25 DIAGNOSIS — I10 Essential (primary) hypertension: Secondary | ICD-10-CM | POA: Diagnosis not present

## 2014-01-25 DIAGNOSIS — D649 Anemia, unspecified: Secondary | ICD-10-CM | POA: Diagnosis not present

## 2014-01-26 DIAGNOSIS — E11649 Type 2 diabetes mellitus with hypoglycemia without coma: Secondary | ICD-10-CM | POA: Diagnosis not present

## 2014-01-26 DIAGNOSIS — G9340 Encephalopathy, unspecified: Secondary | ICD-10-CM | POA: Diagnosis not present

## 2014-01-26 DIAGNOSIS — D649 Anemia, unspecified: Secondary | ICD-10-CM | POA: Diagnosis not present

## 2014-01-26 DIAGNOSIS — J45909 Unspecified asthma, uncomplicated: Secondary | ICD-10-CM | POA: Diagnosis not present

## 2014-01-26 DIAGNOSIS — I471 Supraventricular tachycardia: Secondary | ICD-10-CM | POA: Diagnosis not present

## 2014-01-26 DIAGNOSIS — I10 Essential (primary) hypertension: Secondary | ICD-10-CM | POA: Diagnosis not present

## 2014-01-27 DIAGNOSIS — I10 Essential (primary) hypertension: Secondary | ICD-10-CM | POA: Diagnosis not present

## 2014-01-27 DIAGNOSIS — I471 Supraventricular tachycardia: Secondary | ICD-10-CM | POA: Diagnosis not present

## 2014-01-27 DIAGNOSIS — G9340 Encephalopathy, unspecified: Secondary | ICD-10-CM | POA: Diagnosis not present

## 2014-01-27 DIAGNOSIS — E11649 Type 2 diabetes mellitus with hypoglycemia without coma: Secondary | ICD-10-CM | POA: Diagnosis not present

## 2014-01-27 DIAGNOSIS — J45909 Unspecified asthma, uncomplicated: Secondary | ICD-10-CM | POA: Diagnosis not present

## 2014-01-27 DIAGNOSIS — D649 Anemia, unspecified: Secondary | ICD-10-CM | POA: Diagnosis not present

## 2014-01-28 DIAGNOSIS — D649 Anemia, unspecified: Secondary | ICD-10-CM | POA: Diagnosis not present

## 2014-01-28 DIAGNOSIS — G9340 Encephalopathy, unspecified: Secondary | ICD-10-CM | POA: Diagnosis not present

## 2014-01-28 DIAGNOSIS — E11649 Type 2 diabetes mellitus with hypoglycemia without coma: Secondary | ICD-10-CM | POA: Diagnosis not present

## 2014-01-28 DIAGNOSIS — I10 Essential (primary) hypertension: Secondary | ICD-10-CM | POA: Diagnosis not present

## 2014-01-28 DIAGNOSIS — J45909 Unspecified asthma, uncomplicated: Secondary | ICD-10-CM | POA: Diagnosis not present

## 2014-01-28 DIAGNOSIS — I471 Supraventricular tachycardia: Secondary | ICD-10-CM | POA: Diagnosis not present

## 2014-01-31 DIAGNOSIS — M4802 Spinal stenosis, cervical region: Secondary | ICD-10-CM | POA: Diagnosis not present

## 2014-01-31 DIAGNOSIS — M4712 Other spondylosis with myelopathy, cervical region: Secondary | ICD-10-CM | POA: Diagnosis not present

## 2014-01-31 DIAGNOSIS — R221 Localized swelling, mass and lump, neck: Secondary | ICD-10-CM | POA: Diagnosis not present

## 2014-01-31 DIAGNOSIS — E119 Type 2 diabetes mellitus without complications: Secondary | ICD-10-CM | POA: Diagnosis not present

## 2014-01-31 DIAGNOSIS — D649 Anemia, unspecified: Secondary | ICD-10-CM | POA: Diagnosis not present

## 2014-01-31 DIAGNOSIS — M47812 Spondylosis without myelopathy or radiculopathy, cervical region: Secondary | ICD-10-CM | POA: Diagnosis not present

## 2014-01-31 DIAGNOSIS — F29 Unspecified psychosis not due to a substance or known physiological condition: Secondary | ICD-10-CM | POA: Diagnosis not present

## 2014-02-01 DIAGNOSIS — J45909 Unspecified asthma, uncomplicated: Secondary | ICD-10-CM | POA: Diagnosis not present

## 2014-02-01 DIAGNOSIS — E11649 Type 2 diabetes mellitus with hypoglycemia without coma: Secondary | ICD-10-CM | POA: Diagnosis not present

## 2014-02-01 DIAGNOSIS — D649 Anemia, unspecified: Secondary | ICD-10-CM | POA: Diagnosis not present

## 2014-02-01 DIAGNOSIS — G9340 Encephalopathy, unspecified: Secondary | ICD-10-CM | POA: Diagnosis not present

## 2014-02-01 DIAGNOSIS — I471 Supraventricular tachycardia: Secondary | ICD-10-CM | POA: Diagnosis not present

## 2014-02-01 DIAGNOSIS — I10 Essential (primary) hypertension: Secondary | ICD-10-CM | POA: Diagnosis not present

## 2014-02-02 DIAGNOSIS — E11649 Type 2 diabetes mellitus with hypoglycemia without coma: Secondary | ICD-10-CM | POA: Diagnosis not present

## 2014-02-02 DIAGNOSIS — I10 Essential (primary) hypertension: Secondary | ICD-10-CM | POA: Diagnosis not present

## 2014-02-02 DIAGNOSIS — G9340 Encephalopathy, unspecified: Secondary | ICD-10-CM | POA: Diagnosis not present

## 2014-02-02 DIAGNOSIS — J45909 Unspecified asthma, uncomplicated: Secondary | ICD-10-CM | POA: Diagnosis not present

## 2014-02-02 DIAGNOSIS — D649 Anemia, unspecified: Secondary | ICD-10-CM | POA: Diagnosis not present

## 2014-02-02 DIAGNOSIS — I471 Supraventricular tachycardia: Secondary | ICD-10-CM | POA: Diagnosis not present

## 2014-02-03 DIAGNOSIS — G9340 Encephalopathy, unspecified: Secondary | ICD-10-CM | POA: Diagnosis not present

## 2014-02-03 DIAGNOSIS — J45909 Unspecified asthma, uncomplicated: Secondary | ICD-10-CM | POA: Diagnosis not present

## 2014-02-03 DIAGNOSIS — D649 Anemia, unspecified: Secondary | ICD-10-CM | POA: Diagnosis not present

## 2014-02-03 DIAGNOSIS — I10 Essential (primary) hypertension: Secondary | ICD-10-CM | POA: Diagnosis not present

## 2014-02-03 DIAGNOSIS — E11649 Type 2 diabetes mellitus with hypoglycemia without coma: Secondary | ICD-10-CM | POA: Diagnosis not present

## 2014-02-03 DIAGNOSIS — I471 Supraventricular tachycardia: Secondary | ICD-10-CM | POA: Diagnosis not present

## 2014-02-04 DIAGNOSIS — J45909 Unspecified asthma, uncomplicated: Secondary | ICD-10-CM | POA: Diagnosis not present

## 2014-02-04 DIAGNOSIS — I10 Essential (primary) hypertension: Secondary | ICD-10-CM | POA: Diagnosis not present

## 2014-02-04 DIAGNOSIS — D649 Anemia, unspecified: Secondary | ICD-10-CM | POA: Diagnosis not present

## 2014-02-04 DIAGNOSIS — I471 Supraventricular tachycardia: Secondary | ICD-10-CM | POA: Diagnosis not present

## 2014-02-04 DIAGNOSIS — G9340 Encephalopathy, unspecified: Secondary | ICD-10-CM | POA: Diagnosis not present

## 2014-02-04 DIAGNOSIS — E11649 Type 2 diabetes mellitus with hypoglycemia without coma: Secondary | ICD-10-CM | POA: Diagnosis not present

## 2014-02-07 DIAGNOSIS — J45909 Unspecified asthma, uncomplicated: Secondary | ICD-10-CM | POA: Diagnosis not present

## 2014-02-07 DIAGNOSIS — D649 Anemia, unspecified: Secondary | ICD-10-CM | POA: Diagnosis not present

## 2014-02-07 DIAGNOSIS — I10 Essential (primary) hypertension: Secondary | ICD-10-CM | POA: Diagnosis not present

## 2014-02-07 DIAGNOSIS — I471 Supraventricular tachycardia: Secondary | ICD-10-CM | POA: Diagnosis not present

## 2014-02-07 DIAGNOSIS — G9340 Encephalopathy, unspecified: Secondary | ICD-10-CM | POA: Diagnosis not present

## 2014-02-07 DIAGNOSIS — E11649 Type 2 diabetes mellitus with hypoglycemia without coma: Secondary | ICD-10-CM | POA: Diagnosis not present

## 2014-02-08 DIAGNOSIS — E11649 Type 2 diabetes mellitus with hypoglycemia without coma: Secondary | ICD-10-CM | POA: Diagnosis not present

## 2014-02-08 DIAGNOSIS — J45909 Unspecified asthma, uncomplicated: Secondary | ICD-10-CM | POA: Diagnosis not present

## 2014-02-08 DIAGNOSIS — I471 Supraventricular tachycardia: Secondary | ICD-10-CM | POA: Diagnosis not present

## 2014-02-08 DIAGNOSIS — D649 Anemia, unspecified: Secondary | ICD-10-CM | POA: Diagnosis not present

## 2014-02-08 DIAGNOSIS — G9340 Encephalopathy, unspecified: Secondary | ICD-10-CM | POA: Diagnosis not present

## 2014-02-08 DIAGNOSIS — I10 Essential (primary) hypertension: Secondary | ICD-10-CM | POA: Diagnosis not present

## 2014-02-09 DIAGNOSIS — G9340 Encephalopathy, unspecified: Secondary | ICD-10-CM | POA: Diagnosis not present

## 2014-02-09 DIAGNOSIS — E11649 Type 2 diabetes mellitus with hypoglycemia without coma: Secondary | ICD-10-CM | POA: Diagnosis not present

## 2014-02-09 DIAGNOSIS — I471 Supraventricular tachycardia: Secondary | ICD-10-CM | POA: Diagnosis not present

## 2014-02-09 DIAGNOSIS — I10 Essential (primary) hypertension: Secondary | ICD-10-CM | POA: Diagnosis not present

## 2014-02-09 DIAGNOSIS — J45909 Unspecified asthma, uncomplicated: Secondary | ICD-10-CM | POA: Diagnosis not present

## 2014-02-09 DIAGNOSIS — D649 Anemia, unspecified: Secondary | ICD-10-CM | POA: Diagnosis not present

## 2014-02-10 DIAGNOSIS — E11649 Type 2 diabetes mellitus with hypoglycemia without coma: Secondary | ICD-10-CM | POA: Diagnosis not present

## 2014-02-10 DIAGNOSIS — D649 Anemia, unspecified: Secondary | ICD-10-CM | POA: Diagnosis not present

## 2014-02-10 DIAGNOSIS — J45909 Unspecified asthma, uncomplicated: Secondary | ICD-10-CM | POA: Diagnosis not present

## 2014-02-10 DIAGNOSIS — G9340 Encephalopathy, unspecified: Secondary | ICD-10-CM | POA: Diagnosis not present

## 2014-02-10 DIAGNOSIS — I471 Supraventricular tachycardia: Secondary | ICD-10-CM | POA: Diagnosis not present

## 2014-02-10 DIAGNOSIS — I10 Essential (primary) hypertension: Secondary | ICD-10-CM | POA: Diagnosis not present

## 2014-02-15 DIAGNOSIS — G9340 Encephalopathy, unspecified: Secondary | ICD-10-CM | POA: Diagnosis not present

## 2014-02-15 DIAGNOSIS — D649 Anemia, unspecified: Secondary | ICD-10-CM | POA: Diagnosis not present

## 2014-02-15 DIAGNOSIS — J45909 Unspecified asthma, uncomplicated: Secondary | ICD-10-CM | POA: Diagnosis not present

## 2014-02-15 DIAGNOSIS — I10 Essential (primary) hypertension: Secondary | ICD-10-CM | POA: Diagnosis not present

## 2014-02-15 DIAGNOSIS — I471 Supraventricular tachycardia: Secondary | ICD-10-CM | POA: Diagnosis not present

## 2014-02-15 DIAGNOSIS — E11649 Type 2 diabetes mellitus with hypoglycemia without coma: Secondary | ICD-10-CM | POA: Diagnosis not present

## 2014-02-16 DIAGNOSIS — I10 Essential (primary) hypertension: Secondary | ICD-10-CM | POA: Diagnosis not present

## 2014-02-16 DIAGNOSIS — D649 Anemia, unspecified: Secondary | ICD-10-CM | POA: Diagnosis not present

## 2014-02-16 DIAGNOSIS — G9340 Encephalopathy, unspecified: Secondary | ICD-10-CM | POA: Diagnosis not present

## 2014-02-16 DIAGNOSIS — E11649 Type 2 diabetes mellitus with hypoglycemia without coma: Secondary | ICD-10-CM | POA: Diagnosis not present

## 2014-02-16 DIAGNOSIS — J45909 Unspecified asthma, uncomplicated: Secondary | ICD-10-CM | POA: Diagnosis not present

## 2014-02-16 DIAGNOSIS — I471 Supraventricular tachycardia: Secondary | ICD-10-CM | POA: Diagnosis not present

## 2014-02-17 DIAGNOSIS — E11649 Type 2 diabetes mellitus with hypoglycemia without coma: Secondary | ICD-10-CM | POA: Diagnosis not present

## 2014-02-17 DIAGNOSIS — D649 Anemia, unspecified: Secondary | ICD-10-CM | POA: Diagnosis not present

## 2014-02-17 DIAGNOSIS — I10 Essential (primary) hypertension: Secondary | ICD-10-CM | POA: Diagnosis not present

## 2014-02-17 DIAGNOSIS — I471 Supraventricular tachycardia: Secondary | ICD-10-CM | POA: Diagnosis not present

## 2014-02-17 DIAGNOSIS — J45909 Unspecified asthma, uncomplicated: Secondary | ICD-10-CM | POA: Diagnosis not present

## 2014-02-17 DIAGNOSIS — G9340 Encephalopathy, unspecified: Secondary | ICD-10-CM | POA: Diagnosis not present

## 2014-02-18 DIAGNOSIS — I471 Supraventricular tachycardia: Secondary | ICD-10-CM | POA: Diagnosis not present

## 2014-02-18 DIAGNOSIS — J45909 Unspecified asthma, uncomplicated: Secondary | ICD-10-CM | POA: Diagnosis not present

## 2014-02-18 DIAGNOSIS — E11649 Type 2 diabetes mellitus with hypoglycemia without coma: Secondary | ICD-10-CM | POA: Diagnosis not present

## 2014-02-18 DIAGNOSIS — I10 Essential (primary) hypertension: Secondary | ICD-10-CM | POA: Diagnosis not present

## 2014-02-18 DIAGNOSIS — G9340 Encephalopathy, unspecified: Secondary | ICD-10-CM | POA: Diagnosis not present

## 2014-02-18 DIAGNOSIS — D649 Anemia, unspecified: Secondary | ICD-10-CM | POA: Diagnosis not present

## 2014-02-21 DIAGNOSIS — I471 Supraventricular tachycardia: Secondary | ICD-10-CM | POA: Diagnosis not present

## 2014-02-21 DIAGNOSIS — D649 Anemia, unspecified: Secondary | ICD-10-CM | POA: Diagnosis not present

## 2014-02-21 DIAGNOSIS — J45909 Unspecified asthma, uncomplicated: Secondary | ICD-10-CM | POA: Diagnosis not present

## 2014-02-21 DIAGNOSIS — I10 Essential (primary) hypertension: Secondary | ICD-10-CM | POA: Diagnosis not present

## 2014-02-21 DIAGNOSIS — G9340 Encephalopathy, unspecified: Secondary | ICD-10-CM | POA: Diagnosis not present

## 2014-02-21 DIAGNOSIS — E11649 Type 2 diabetes mellitus with hypoglycemia without coma: Secondary | ICD-10-CM | POA: Diagnosis not present

## 2014-02-22 DIAGNOSIS — I471 Supraventricular tachycardia: Secondary | ICD-10-CM | POA: Diagnosis not present

## 2014-02-22 DIAGNOSIS — G9340 Encephalopathy, unspecified: Secondary | ICD-10-CM | POA: Diagnosis not present

## 2014-02-22 DIAGNOSIS — E11649 Type 2 diabetes mellitus with hypoglycemia without coma: Secondary | ICD-10-CM | POA: Diagnosis not present

## 2014-02-22 DIAGNOSIS — J45909 Unspecified asthma, uncomplicated: Secondary | ICD-10-CM | POA: Diagnosis not present

## 2014-02-22 DIAGNOSIS — I10 Essential (primary) hypertension: Secondary | ICD-10-CM | POA: Diagnosis not present

## 2014-02-22 DIAGNOSIS — D649 Anemia, unspecified: Secondary | ICD-10-CM | POA: Diagnosis not present

## 2014-02-23 DIAGNOSIS — E11649 Type 2 diabetes mellitus with hypoglycemia without coma: Secondary | ICD-10-CM | POA: Diagnosis not present

## 2014-02-23 DIAGNOSIS — I471 Supraventricular tachycardia: Secondary | ICD-10-CM | POA: Diagnosis not present

## 2014-02-23 DIAGNOSIS — D649 Anemia, unspecified: Secondary | ICD-10-CM | POA: Diagnosis not present

## 2014-02-23 DIAGNOSIS — J45909 Unspecified asthma, uncomplicated: Secondary | ICD-10-CM | POA: Diagnosis not present

## 2014-02-23 DIAGNOSIS — I10 Essential (primary) hypertension: Secondary | ICD-10-CM | POA: Diagnosis not present

## 2014-02-23 DIAGNOSIS — G9340 Encephalopathy, unspecified: Secondary | ICD-10-CM | POA: Diagnosis not present

## 2014-02-24 DIAGNOSIS — E11649 Type 2 diabetes mellitus with hypoglycemia without coma: Secondary | ICD-10-CM | POA: Diagnosis not present

## 2014-02-24 DIAGNOSIS — J45909 Unspecified asthma, uncomplicated: Secondary | ICD-10-CM | POA: Diagnosis not present

## 2014-02-24 DIAGNOSIS — I10 Essential (primary) hypertension: Secondary | ICD-10-CM | POA: Diagnosis not present

## 2014-02-24 DIAGNOSIS — I471 Supraventricular tachycardia: Secondary | ICD-10-CM | POA: Diagnosis not present

## 2014-02-24 DIAGNOSIS — D649 Anemia, unspecified: Secondary | ICD-10-CM | POA: Diagnosis not present

## 2014-02-24 DIAGNOSIS — G9340 Encephalopathy, unspecified: Secondary | ICD-10-CM | POA: Diagnosis not present

## 2014-02-25 DIAGNOSIS — H53123 Transient visual loss, bilateral: Secondary | ICD-10-CM | POA: Diagnosis not present

## 2014-02-25 DIAGNOSIS — E119 Type 2 diabetes mellitus without complications: Secondary | ICD-10-CM | POA: Diagnosis not present

## 2014-02-28 DIAGNOSIS — D649 Anemia, unspecified: Secondary | ICD-10-CM | POA: Diagnosis not present

## 2014-02-28 DIAGNOSIS — I471 Supraventricular tachycardia: Secondary | ICD-10-CM | POA: Diagnosis not present

## 2014-02-28 DIAGNOSIS — G9340 Encephalopathy, unspecified: Secondary | ICD-10-CM | POA: Diagnosis not present

## 2014-02-28 DIAGNOSIS — I10 Essential (primary) hypertension: Secondary | ICD-10-CM | POA: Diagnosis not present

## 2014-02-28 DIAGNOSIS — J45909 Unspecified asthma, uncomplicated: Secondary | ICD-10-CM | POA: Diagnosis not present

## 2014-02-28 DIAGNOSIS — E11649 Type 2 diabetes mellitus with hypoglycemia without coma: Secondary | ICD-10-CM | POA: Diagnosis not present

## 2014-03-01 DIAGNOSIS — E11649 Type 2 diabetes mellitus with hypoglycemia without coma: Secondary | ICD-10-CM | POA: Diagnosis not present

## 2014-03-01 DIAGNOSIS — I10 Essential (primary) hypertension: Secondary | ICD-10-CM | POA: Diagnosis not present

## 2014-03-01 DIAGNOSIS — D649 Anemia, unspecified: Secondary | ICD-10-CM | POA: Diagnosis not present

## 2014-03-01 DIAGNOSIS — J45909 Unspecified asthma, uncomplicated: Secondary | ICD-10-CM | POA: Diagnosis not present

## 2014-03-01 DIAGNOSIS — G9340 Encephalopathy, unspecified: Secondary | ICD-10-CM | POA: Diagnosis not present

## 2014-03-01 DIAGNOSIS — I471 Supraventricular tachycardia: Secondary | ICD-10-CM | POA: Diagnosis not present

## 2014-03-02 DIAGNOSIS — J45909 Unspecified asthma, uncomplicated: Secondary | ICD-10-CM | POA: Diagnosis not present

## 2014-03-02 DIAGNOSIS — E11649 Type 2 diabetes mellitus with hypoglycemia without coma: Secondary | ICD-10-CM | POA: Diagnosis not present

## 2014-03-02 DIAGNOSIS — G9340 Encephalopathy, unspecified: Secondary | ICD-10-CM | POA: Diagnosis not present

## 2014-03-02 DIAGNOSIS — I10 Essential (primary) hypertension: Secondary | ICD-10-CM | POA: Diagnosis not present

## 2014-03-02 DIAGNOSIS — I471 Supraventricular tachycardia: Secondary | ICD-10-CM | POA: Diagnosis not present

## 2014-03-02 DIAGNOSIS — D649 Anemia, unspecified: Secondary | ICD-10-CM | POA: Diagnosis not present

## 2014-03-03 DIAGNOSIS — I471 Supraventricular tachycardia: Secondary | ICD-10-CM | POA: Diagnosis not present

## 2014-03-03 DIAGNOSIS — J301 Allergic rhinitis due to pollen: Secondary | ICD-10-CM | POA: Diagnosis not present

## 2014-03-03 DIAGNOSIS — I10 Essential (primary) hypertension: Secondary | ICD-10-CM | POA: Diagnosis not present

## 2014-03-03 DIAGNOSIS — E11649 Type 2 diabetes mellitus with hypoglycemia without coma: Secondary | ICD-10-CM | POA: Diagnosis not present

## 2014-03-03 DIAGNOSIS — J45909 Unspecified asthma, uncomplicated: Secondary | ICD-10-CM | POA: Diagnosis not present

## 2014-03-03 DIAGNOSIS — G9340 Encephalopathy, unspecified: Secondary | ICD-10-CM | POA: Diagnosis not present

## 2014-03-03 DIAGNOSIS — D649 Anemia, unspecified: Secondary | ICD-10-CM | POA: Diagnosis not present

## 2014-03-07 DIAGNOSIS — G9340 Encephalopathy, unspecified: Secondary | ICD-10-CM | POA: Diagnosis not present

## 2014-03-07 DIAGNOSIS — I10 Essential (primary) hypertension: Secondary | ICD-10-CM | POA: Diagnosis not present

## 2014-03-07 DIAGNOSIS — J45909 Unspecified asthma, uncomplicated: Secondary | ICD-10-CM | POA: Diagnosis not present

## 2014-03-07 DIAGNOSIS — D649 Anemia, unspecified: Secondary | ICD-10-CM | POA: Diagnosis not present

## 2014-03-07 DIAGNOSIS — I471 Supraventricular tachycardia: Secondary | ICD-10-CM | POA: Diagnosis not present

## 2014-03-07 DIAGNOSIS — E11649 Type 2 diabetes mellitus with hypoglycemia without coma: Secondary | ICD-10-CM | POA: Diagnosis not present

## 2014-03-08 DIAGNOSIS — J45909 Unspecified asthma, uncomplicated: Secondary | ICD-10-CM | POA: Diagnosis not present

## 2014-03-08 DIAGNOSIS — E11649 Type 2 diabetes mellitus with hypoglycemia without coma: Secondary | ICD-10-CM | POA: Diagnosis not present

## 2014-03-08 DIAGNOSIS — I471 Supraventricular tachycardia: Secondary | ICD-10-CM | POA: Diagnosis not present

## 2014-03-08 DIAGNOSIS — I10 Essential (primary) hypertension: Secondary | ICD-10-CM | POA: Diagnosis not present

## 2014-03-08 DIAGNOSIS — G9340 Encephalopathy, unspecified: Secondary | ICD-10-CM | POA: Diagnosis not present

## 2014-03-08 DIAGNOSIS — D649 Anemia, unspecified: Secondary | ICD-10-CM | POA: Diagnosis not present

## 2014-03-09 ENCOUNTER — Telehealth: Payer: Self-pay | Admitting: Oncology

## 2014-03-09 DIAGNOSIS — I10 Essential (primary) hypertension: Secondary | ICD-10-CM | POA: Diagnosis not present

## 2014-03-09 DIAGNOSIS — E11649 Type 2 diabetes mellitus with hypoglycemia without coma: Secondary | ICD-10-CM | POA: Diagnosis not present

## 2014-03-09 DIAGNOSIS — G9340 Encephalopathy, unspecified: Secondary | ICD-10-CM | POA: Diagnosis not present

## 2014-03-09 DIAGNOSIS — I471 Supraventricular tachycardia: Secondary | ICD-10-CM | POA: Diagnosis not present

## 2014-03-09 DIAGNOSIS — J45909 Unspecified asthma, uncomplicated: Secondary | ICD-10-CM | POA: Diagnosis not present

## 2014-03-09 DIAGNOSIS — D649 Anemia, unspecified: Secondary | ICD-10-CM | POA: Diagnosis not present

## 2014-03-09 NOTE — Telephone Encounter (Signed)
cld pt to adv of r/s appt-pt understood

## 2014-03-11 DIAGNOSIS — D649 Anemia, unspecified: Secondary | ICD-10-CM | POA: Diagnosis not present

## 2014-03-11 DIAGNOSIS — G9340 Encephalopathy, unspecified: Secondary | ICD-10-CM | POA: Diagnosis not present

## 2014-03-11 DIAGNOSIS — I10 Essential (primary) hypertension: Secondary | ICD-10-CM | POA: Diagnosis not present

## 2014-03-11 DIAGNOSIS — E11649 Type 2 diabetes mellitus with hypoglycemia without coma: Secondary | ICD-10-CM | POA: Diagnosis not present

## 2014-03-11 DIAGNOSIS — J45909 Unspecified asthma, uncomplicated: Secondary | ICD-10-CM | POA: Diagnosis not present

## 2014-03-11 DIAGNOSIS — I471 Supraventricular tachycardia: Secondary | ICD-10-CM | POA: Diagnosis not present

## 2014-03-14 DIAGNOSIS — I471 Supraventricular tachycardia: Secondary | ICD-10-CM | POA: Diagnosis not present

## 2014-03-14 DIAGNOSIS — D649 Anemia, unspecified: Secondary | ICD-10-CM | POA: Diagnosis not present

## 2014-03-14 DIAGNOSIS — E11649 Type 2 diabetes mellitus with hypoglycemia without coma: Secondary | ICD-10-CM | POA: Diagnosis not present

## 2014-03-14 DIAGNOSIS — G9340 Encephalopathy, unspecified: Secondary | ICD-10-CM | POA: Diagnosis not present

## 2014-03-14 DIAGNOSIS — I10 Essential (primary) hypertension: Secondary | ICD-10-CM | POA: Diagnosis not present

## 2014-03-14 DIAGNOSIS — J45909 Unspecified asthma, uncomplicated: Secondary | ICD-10-CM | POA: Diagnosis not present

## 2014-03-25 ENCOUNTER — Other Ambulatory Visit: Payer: Self-pay

## 2014-03-25 DIAGNOSIS — D649 Anemia, unspecified: Secondary | ICD-10-CM

## 2014-03-27 ENCOUNTER — Other Ambulatory Visit: Payer: Self-pay | Admitting: Oncology

## 2014-03-28 ENCOUNTER — Other Ambulatory Visit: Payer: Medicare Other

## 2014-03-28 ENCOUNTER — Ambulatory Visit (HOSPITAL_BASED_OUTPATIENT_CLINIC_OR_DEPARTMENT_OTHER): Payer: Medicare Other | Admitting: Oncology

## 2014-03-28 ENCOUNTER — Ambulatory Visit: Payer: Medicare Other | Admitting: Oncology

## 2014-03-28 ENCOUNTER — Other Ambulatory Visit (HOSPITAL_BASED_OUTPATIENT_CLINIC_OR_DEPARTMENT_OTHER): Payer: Medicare Other

## 2014-03-28 VITALS — BP 144/71 | HR 122 | Temp 98.4°F | Resp 18 | Ht 59.0 in | Wt 120.1 lb

## 2014-03-28 DIAGNOSIS — K625 Hemorrhage of anus and rectum: Secondary | ICD-10-CM

## 2014-03-28 DIAGNOSIS — D649 Anemia, unspecified: Secondary | ICD-10-CM | POA: Diagnosis not present

## 2014-03-28 DIAGNOSIS — E119 Type 2 diabetes mellitus without complications: Secondary | ICD-10-CM | POA: Diagnosis not present

## 2014-03-28 DIAGNOSIS — M069 Rheumatoid arthritis, unspecified: Secondary | ICD-10-CM | POA: Diagnosis not present

## 2014-03-28 DIAGNOSIS — G8929 Other chronic pain: Secondary | ICD-10-CM

## 2014-03-28 DIAGNOSIS — I10 Essential (primary) hypertension: Secondary | ICD-10-CM

## 2014-03-28 DIAGNOSIS — N183 Chronic kidney disease, stage 3 unspecified: Secondary | ICD-10-CM

## 2014-03-28 DIAGNOSIS — E611 Iron deficiency: Secondary | ICD-10-CM

## 2014-03-28 LAB — CBC WITH DIFFERENTIAL/PLATELET
BASO%: 0.4 % (ref 0.0–2.0)
BASOS ABS: 0 10*3/uL (ref 0.0–0.1)
EOS ABS: 0.1 10*3/uL (ref 0.0–0.5)
EOS%: 0.6 % (ref 0.0–7.0)
HCT: 26.9 % — ABNORMAL LOW (ref 34.8–46.6)
HEMOGLOBIN: 8.5 g/dL — AB (ref 11.6–15.9)
LYMPH%: 2.9 % — ABNORMAL LOW (ref 14.0–49.7)
MCH: 27.9 pg (ref 25.1–34.0)
MCHC: 31.7 g/dL (ref 31.5–36.0)
MCV: 88.2 fL (ref 79.5–101.0)
MONO#: 0.6 10*3/uL (ref 0.1–0.9)
MONO%: 4.9 % (ref 0.0–14.0)
NEUT%: 91.2 % — ABNORMAL HIGH (ref 38.4–76.8)
NEUTROS ABS: 10.3 10*3/uL — AB (ref 1.5–6.5)
Platelets: 269 10*3/uL (ref 145–400)
RBC: 3.05 10*6/uL — ABNORMAL LOW (ref 3.70–5.45)
RDW: 13.8 % (ref 11.2–14.5)
WBC: 11.3 10*3/uL — AB (ref 3.9–10.3)
lymph#: 0.3 10*3/uL — ABNORMAL LOW (ref 0.9–3.3)

## 2014-03-28 LAB — COMPREHENSIVE METABOLIC PANEL (CC13)
ALBUMIN: 2.8 g/dL — AB (ref 3.5–5.0)
ALK PHOS: 153 U/L — AB (ref 40–150)
ALT: 12 U/L (ref 0–55)
ANION GAP: 11 meq/L (ref 3–11)
AST: 13 U/L (ref 5–34)
BUN: 16.8 mg/dL (ref 7.0–26.0)
CALCIUM: 8.5 mg/dL (ref 8.4–10.4)
CO2: 29 mEq/L (ref 22–29)
Chloride: 97 mEq/L — ABNORMAL LOW (ref 98–109)
Creatinine: 1.2 mg/dL — ABNORMAL HIGH (ref 0.6–1.1)
EGFR: 46 mL/min/{1.73_m2} — ABNORMAL LOW (ref >90)
GLUCOSE: 195 mg/dL — AB (ref 70–140)
POTASSIUM: 4.8 meq/L (ref 3.5–5.1)
Sodium: 136 mEq/L (ref 136–145)
TOTAL PROTEIN: 5.8 g/dL — AB (ref 6.4–8.3)
Total Bilirubin: 0.3 mg/dL (ref 0.20–1.20)

## 2014-03-29 ENCOUNTER — Encounter: Payer: Self-pay | Admitting: Oncology

## 2014-03-29 ENCOUNTER — Other Ambulatory Visit: Payer: Self-pay | Admitting: Oncology

## 2014-03-29 DIAGNOSIS — E611 Iron deficiency: Secondary | ICD-10-CM

## 2014-03-29 LAB — FERRITIN CHCC: Ferritin: 189 ng/ml (ref 9–269)

## 2014-03-29 LAB — IRON AND TIBC CHCC
%SAT: 26 % (ref 21–57)
Iron: 52 ug/dL (ref 41–142)
TIBC: 199 ug/dL — ABNORMAL LOW (ref 236–444)
UIBC: 147 ug/dL (ref 120–384)

## 2014-03-29 NOTE — Progress Notes (Signed)
OFFICE PROGRESS NOTE   03/28/2014  Physicians:R.Minna Antis, M.Wynell Balloon, ([place])  INTERVAL HISTORY:   Patient is seen, together with husband, in scheduled follow up of multifactorial anemia, previously in part iron deficient and post feraheme 03-2013. Last PRBCs in this EMR were 08-2013. Patient reports "dark blood for 2 days" with bowel movements recently, resolved when she resumed sitz baths; she denies other bleeding, but did see large bruise right upper arm yesterday, no known trauma.  She was hospitalized from 9-22 thru 01-12-14 with acute encephalopathy apparently related to hypoglycemia and polypharmacy. Husband recalls that hydrocodone and tramadol had been discontinued shortly prior to that hospitalization. Imaging during that hospitalization included CT and MRI head, CT AP and CXR, without findings of concern from hematologic standpoint. On 01-10-14 hemoglobin was 8.8, that the last value in this EMR prior to today. She had home PT/OT for several weeks after discharge, but has not kept up with recommended exercises since that stopped and notices that she is again very weak. She sees PCP Dr Minna Antis ~ every 6 weeks, due again late Dec. She has used naproxen regularly since she stopped hydrocodone and tramadol, for chronic arthritis pain. She is on chronic steroids for RA. She stopped oral iron after IV iron a year ago. PT and PTT were not elevated and erythropoietin level was 104 in 02-2013, with B12 and folate normal.     Review of systems as above, also: No fever or symptoms of infection. On continuous oxygen. Appetite fair.  Remainder of 10 point Review of Systems negative.  Objective:  Vital signs in last 24 hours:  BP 144/71 mmHg  Pulse 122  Temp(Src) 98.4 F (36.9 C) (Oral)  Resp 18  Ht _0  (1.499 m)  Wt 120 lb 1.6 oz (54.477 kg)  BMI 24.24 kg/m2 Weight down 2 lbs. Frail appearing, color poor, looks chronically ill. Respirations not labored at rest. Alert, oriented  and appropriate. In [facility] with portable O2. Husband very supportive  HEENT:PERRL, sclerae not icteric. Oral mucosa somewhat dry without lesions, posterior pharynx clear. No JVD.  Lymphatics:no cervical,supraclavicular adenopathy Resp: diminished BS thruout without wheezes or rales, no dullness to percussion Cardio: regular rate and rhythm. No gallop. GI: soft, nontender, no appreciable organomegaly. Some bowel sounds.  Musculoskeletal/ Extremities: without pitting edema, cords, tenderness Neuro: speech mostly fluent and appropriate. Moves all extremities. PSYCH responses somewhat slow but appropriate Skin extensive confluent bruise right upper arm medially, no heat or fluctuance. Otherwise scattered ecchymoses LE and hands. Skin very thin consistent with long term steroids   Lab Results:  Results for orders placed or performed in visit on 03/28/14  CBC with Differential  Result Value Ref Range   WBC 11.3 (H) 3.9 - 10.3 10e3/uL   NEUT# 10.3 (H) 1.5 - 6.5 10e3/uL   HGB 8.5 (L) 11.6 - 15.9 g/dL   HCT 26.9 (L) 34.8 - 46.6 %   Platelets 269 145 - 400 10e3/uL   MCV 88.2 79.5 - 101.0 fL   MCH 27.9 25.1 - 34.0 pg   MCHC 31.7 31.5 - 36.0 g/dL   RBC 3.05 (L) 3.70 - 5.45 10e6/uL   RDW 13.8 11.2 - 14.5 %   lymph# 0.3 (L) 0.9 - 3.3 10e3/uL   MONO# 0.6 0.1 - 0.9 10e3/uL   Eosinophils Absolute 0.1 0.0 - 0.5 10e3/uL   Basophils Absolute 0.0 0.0 - 0.1 10e3/uL   NEUT% 91.2 (H) 38.4 - 76.8 %   LYMPH% 2.9 (L) 14.0 - 49.7 %   MONO%  4.9 0.0 - 14.0 %   EOS% 0.6 0.0 - 7.0 %   BASO% 0.4 0.0 - 2.0 %  Comprehensive metabolic panel (Cmet) - [facility]  Result Value Ref Range   Sodium 136 136 - 145 mEq/L   Potassium 4.8 3.5 - 5.1 mEq/L   Chloride 97 (L) 98 - 109 mEq/L   CO2 29 22 - 29 mEq/L   Glucose 195 (H) 70 - 140 mg/dl   BUN 16.8 7.0 - 26.0 mg/dL   Creatinine 1.2 (H) 0.6 - 1.1 mg/dL   Total Bilirubin 0.30 0.20 - 1.20 mg/dL   Alkaline Phosphatase 153 (H) 40 - 150 U/L   AST 13 5 - 34 U/L   ALT 12 0 - 55  U/L   Total Protein 5.8 (L) 6.4 - 8.3 g/dL   Albumin 2.8 (L) 3.5 - 5.0 g/dL   Calcium 8.5 8.4 - 10.4 mg/dL   Anion Gap 11 3 - 11 mEq/L   EGFR 46 (L) >90 ml/min/1.73 m2  Iron and TIBC [facility]  Result Value Ref Range   Iron 52 41 - 142 ug/dL   TIBC 199 (L) 236 - 444 ug/dL   UIBC 147 120 - 384 ug/dL   %SAT 26 21 - 57 %  Ferritin  Result Value Ref Range   Ferritin 189 9 - 269 ng/ml    Chemistries and iron studies available after visit  Studies/Results:  No results found.  Medications: I have reviewed the patient's current medications. Note creatinine more elevated, which may be related to increased naproxen.  DISCUSSION: have explained that long term prednisone and NSAID make bruising more likely. With labs resulting after visit, will suggest she resume oral iron as hemocyte or ferrous fumarate, but will not give additional feraheme now.   Assessment/Plan: .Anemia: previously iron deficient likely from hemorrhoidal bleeding x months, multiple blood draws x years and inadequate oral absorption of iron, also may have element of anemia of chronic disease and/ or Arava. Erythropoietin level elevated last fall. Had full dose IV iron dextran 03-18-13. Will suggest resuming oral iron and follow up labs with PCP prior to next visit here.If oral iron not sufficient could give feraheme later. 2.rheumatoid arthritis x 20 years, on long term daily prednisone.  3. Atrophic gastritis, hx adenomatous colon polyps, bleeding hemorrhoids: followed by Dr Fuller Plan.  4.diabetes on insulin and metformin, followed by Dr Minna Antis  5.history of esophageal stricture  6.asthma: on continuous O2 and home nebulizers  7.hx perirectal abscess, resolved with I&D  8.post cholecystectomy and abdominal hysterectomy  9.HTN, hx nonSTEMI  10.hx pericarditis  11.scoliosis and degenerative arthritis in spine + RA.  12.hx single seizure possibly related to DM  13 chronic kidney disease stage 3, creatinine more elevated on  present labs. May need some other pain medication than NSAID 14.severe deconditioning:  home PT/OT seemed helpful after hospitalization this fall. I have encouraged her to resume exercises as instructed. 15.chronic pain related to multiple problems 16.acute encephalopathy related to polypharmacy and hypoglycemia 12-2013 17.bruising: related to steroids, NSAID. Large area on arm suggests where she was held to assist with transfer.  Time spent 25 min including >50% counseling and coordination of care. Patient and husband aware that we will let them know results of iron studies and recommendations.   Gordy Levan, MD

## 2014-03-30 ENCOUNTER — Telehealth: Payer: Self-pay | Admitting: Oncology

## 2014-03-30 ENCOUNTER — Telehealth: Payer: Self-pay

## 2014-03-30 DIAGNOSIS — D649 Anemia, unspecified: Secondary | ICD-10-CM

## 2014-03-30 MED ORDER — INTEGRA PLUS PO CAPS: 1.0000 | ORAL_CAPSULE | Freq: Two times a day (BID) | ORAL | Status: DC

## 2014-03-30 NOTE — Telephone Encounter (Signed)
s.w. pt and advised on March 2016 appt....ok and ware

## 2014-03-30 NOTE — Telephone Encounter (Signed)
In speaking with Marisa [name] he said that Dr. Minna Antis had prescribed Integra Plus ~ 6 months ago.  She has some on hand and tolerates this pretty well. Told husband that Dr. Marko Plume said that the Integra Plus would be fine, However she needs to take 1 tab bid since each tab has only 65 mg of ferrous fumarate. Husband verbalized understanding.  Will send a prescription for 60 tabs to their pharmacy.

## 2014-03-30 NOTE — Telephone Encounter (Signed)
-----   Message from Gordy Levan, MD sent at 03/29/2014  7:36 PM EST ----- Please let patient and husband know that iron studies were not low enough to need IV iron yet. Suggest she start Hemocyte or ferrous fumarate (no substitute) 325 mg once daily. Take away from meals with OJ (she is diabetic, don't know if can have OJ) or vitamin C tablet.  I expect Dr Minna Antis will check her hemoglobin when he sees her late Dec. I will see her in 3-4 months and recheck labs then.  thanks

## 2014-04-13 ENCOUNTER — Observation Stay (HOSPITAL_COMMUNITY)
Admission: EM | Admit: 2014-04-13 | Discharge: 2014-04-14 | Disposition: A | Discharge status: Home / Self Care | Payer: Medicare Other | Attending: Internal Medicine | Admitting: Internal Medicine

## 2014-04-13 ENCOUNTER — Encounter (HOSPITAL_COMMUNITY): Payer: Self-pay | Admitting: Emergency Medicine

## 2014-04-13 DIAGNOSIS — K219 Gastro-esophageal reflux disease without esophagitis: Secondary | ICD-10-CM | POA: Insufficient documentation

## 2014-04-13 DIAGNOSIS — J45909 Unspecified asthma, uncomplicated: Secondary | ICD-10-CM | POA: Insufficient documentation

## 2014-04-13 DIAGNOSIS — Z7952 Long term (current) use of systemic steroids: Secondary | ICD-10-CM | POA: Insufficient documentation

## 2014-04-13 DIAGNOSIS — E119 Type 2 diabetes mellitus without complications: Secondary | ICD-10-CM | POA: Diagnosis not present

## 2014-04-13 DIAGNOSIS — Z794 Long term (current) use of insulin: Secondary | ICD-10-CM | POA: Insufficient documentation

## 2014-04-13 DIAGNOSIS — I252 Old myocardial infarction: Secondary | ICD-10-CM | POA: Insufficient documentation

## 2014-04-13 DIAGNOSIS — I10 Essential (primary) hypertension: Secondary | ICD-10-CM | POA: Insufficient documentation

## 2014-04-13 DIAGNOSIS — R609 Edema, unspecified: Secondary | ICD-10-CM | POA: Diagnosis not present

## 2014-04-13 DIAGNOSIS — D649 Anemia, unspecified: Principal | ICD-10-CM | POA: Diagnosis present

## 2014-04-13 DIAGNOSIS — M069 Rheumatoid arthritis, unspecified: Secondary | ICD-10-CM | POA: Diagnosis present

## 2014-04-13 LAB — CBC
HEMATOCRIT: 23.1 % — AB (ref 36.0–46.0)
Hemoglobin: 7.1 g/dL — ABNORMAL LOW (ref 12.0–15.0)
MCH: 29 pg (ref 26.0–34.0)
MCHC: 30.7 g/dL (ref 30.0–36.0)
MCV: 94.3 fL (ref 78.0–100.0)
Platelets: 318 10*3/uL (ref 150–400)
RBC: 2.45 MIL/uL — ABNORMAL LOW (ref 3.87–5.11)
RDW: 14.5 % (ref 11.5–15.5)
WBC: 11 10*3/uL — ABNORMAL HIGH (ref 4.0–10.5)

## 2014-04-13 LAB — PREPARE RBC (CROSSMATCH)

## 2014-04-13 LAB — BASIC METABOLIC PANEL
ANION GAP: 6 (ref 5–15)
BUN: 15 mg/dL (ref 6–23)
CALCIUM: 8.3 mg/dL — AB (ref 8.4–10.5)
CO2: 35 mmol/L — ABNORMAL HIGH (ref 19–32)
Chloride: 97 mEq/L (ref 96–112)
Creatinine, Ser: 1.18 mg/dL — ABNORMAL HIGH (ref 0.50–1.10)
GFR calc non Af Amer: 45 mL/min — ABNORMAL LOW (ref >90)
GFR, EST AFRICAN AMERICAN: 52 mL/min — AB (ref >90)
Glucose, Bld: 150 mg/dL — ABNORMAL HIGH (ref 70–99)
Potassium: 4.6 mmol/L (ref 3.5–5.1)
SODIUM: 138 mmol/L (ref 135–145)

## 2014-04-13 LAB — CBG MONITORING, ED: Glucose-Capillary: 130 mg/dL — ABNORMAL HIGH (ref 70–99)

## 2014-04-13 LAB — TROPONIN I: Troponin I: <0.03 ng/mL (ref <0.031)

## 2014-04-13 LAB — I-STAT CG4 LACTIC ACID, ED: Lactic Acid, Venous: 1.98 mmol/L (ref 0.5–2.2)

## 2014-04-13 LAB — GLUCOSE, CAPILLARY: Glucose-Capillary: 64 mg/dL — ABNORMAL LOW (ref 70–99)

## 2014-04-13 MED ORDER — ONDANSETRON HCL 4 MG PO TABS: 4.0000 mg | ORAL_TABLET | Freq: Four times a day (QID) | ORAL | Status: DC | PRN

## 2014-04-13 MED ORDER — LISINOPRIL 20 MG PO TABS
20.0000 mg | ORAL_TABLET | Freq: Every day | ORAL | Status: DC
  Administered 2014-04-14: 20 mg via ORAL
  Filled 2014-04-13: qty 1

## 2014-04-13 MED ORDER — POLYSACCHARIDE IRON COMPLEX 150 MG PO CAPS
150.0000 mg | ORAL_CAPSULE | Freq: Two times a day (BID) | ORAL | Status: DC
  Administered 2014-04-13 – 2014-04-14 (×2): 150 mg via ORAL
  Filled 2014-04-13 (×3): qty 1

## 2014-04-13 MED ORDER — ALBUTEROL SULFATE (2.5 MG/3ML) 0.083% IN NEBU: 2.5000 mg | INHALATION_SOLUTION | Freq: Four times a day (QID) | RESPIRATORY_TRACT | Status: DC | PRN

## 2014-04-13 MED ORDER — LEVALBUTEROL TARTRATE 45 MCG/ACT IN AERO: 1.0000 | INHALATION_SPRAY | RESPIRATORY_TRACT | Status: DC | PRN

## 2014-04-13 MED ORDER — METOPROLOL SUCCINATE ER 100 MG PO TB24
100.0000 mg | ORAL_TABLET | Freq: Every day | ORAL | Status: DC
  Administered 2014-04-14: 100 mg via ORAL
  Filled 2014-04-13: qty 1

## 2014-04-13 MED ORDER — INSULIN DETEMIR 100 UNIT/ML ~~LOC~~ SOLN
10.0000 [IU] | Freq: Every day | SUBCUTANEOUS | Status: DC
  Filled 2014-04-13: qty 0.1

## 2014-04-13 MED ORDER — HYDROCORTISONE ACETATE 25 MG RE SUPP
25.0000 mg | Freq: Two times a day (BID) | RECTAL | Status: DC | PRN
  Filled 2014-04-13: qty 1

## 2014-04-13 MED ORDER — PREGABALIN 75 MG PO CAPS
75.0000 mg | ORAL_CAPSULE | Freq: Every day | ORAL | Status: DC
  Administered 2014-04-14: 75 mg via ORAL
  Filled 2014-04-13: qty 1

## 2014-04-13 MED ORDER — ONDANSETRON HCL 4 MG/2ML IJ SOLN: 4.0000 mg | Freq: Four times a day (QID) | INTRAMUSCULAR | Status: DC | PRN

## 2014-04-13 MED ORDER — AMLODIPINE BESYLATE 10 MG PO TABS
10.0000 mg | ORAL_TABLET | Freq: Every day | ORAL | Status: DC
  Administered 2014-04-13 – 2014-04-14 (×2): 10 mg via ORAL
  Filled 2014-04-13 (×2): qty 1

## 2014-04-13 MED ORDER — LORAZEPAM 0.5 MG PO TABS
0.5000 mg | ORAL_TABLET | Freq: Every day | ORAL | Status: DC
  Administered 2014-04-13: 0.5 mg via ORAL
  Filled 2014-04-13: qty 1

## 2014-04-13 MED ORDER — DOCUSATE SODIUM 100 MG PO CAPS
100.0000 mg | ORAL_CAPSULE | Freq: Every day | ORAL | Status: DC
  Administered 2014-04-13: 100 mg via ORAL
  Filled 2014-04-13 (×3): qty 1

## 2014-04-13 MED ORDER — PROMETHAZINE HCL 25 MG PO TABS: 12.5000 mg | ORAL_TABLET | Freq: Four times a day (QID) | ORAL | Status: DC | PRN

## 2014-04-13 MED ORDER — DULOXETINE HCL 30 MG PO CPEP
30.0000 mg | ORAL_CAPSULE | Freq: Every day | ORAL | Status: DC
  Administered 2014-04-13 – 2014-04-14 (×2): 30 mg via ORAL
  Filled 2014-04-13 (×2): qty 1

## 2014-04-13 MED ORDER — ACETAMINOPHEN 650 MG RE SUPP: 650.0000 mg | Freq: Four times a day (QID) | RECTAL | Status: DC | PRN

## 2014-04-13 MED ORDER — RISPERIDONE 0.25 MG PO TABS
0.2500 mg | ORAL_TABLET | Freq: Two times a day (BID) | ORAL | Status: DC
  Administered 2014-04-13 – 2014-04-14 (×2): 0.25 mg via ORAL
  Filled 2014-04-13 (×3): qty 1

## 2014-04-13 MED ORDER — PREDNISONE 10 MG PO TABS
10.0000 mg | ORAL_TABLET | Freq: Every day | ORAL | Status: DC
  Administered 2014-04-14: 10 mg via ORAL
  Filled 2014-04-13: qty 1

## 2014-04-13 MED ORDER — INSULIN ASPART 100 UNIT/ML ~~LOC~~ SOLN
0.0000 [IU] | Freq: Three times a day (TID) | SUBCUTANEOUS | Status: DC
Start: 2014-04-14 — End: 2014-04-14
  Administered 2014-04-14: 1 [IU] via SUBCUTANEOUS

## 2014-04-13 MED ORDER — IPRATROPIUM BROMIDE 0.03 % NA SOLN
1.0000 | Freq: Four times a day (QID) | NASAL | Status: DC
  Administered 2014-04-14: 1 via NASAL
  Filled 2014-04-13: qty 15

## 2014-04-13 MED ORDER — ACETAMINOPHEN 325 MG PO TABS
650.0000 mg | ORAL_TABLET | Freq: Four times a day (QID) | ORAL | Status: DC | PRN
  Filled 2014-04-13: qty 2

## 2014-04-13 MED ORDER — SODIUM CHLORIDE 0.9 % IV SOLN
Freq: Once | INTRAVENOUS | Status: AC
  Administered 2014-04-13: via INTRAVENOUS

## 2014-04-13 MED ORDER — INTEGRA PLUS PO CAPS: 1.0000 | ORAL_CAPSULE | Freq: Two times a day (BID) | ORAL | Status: DC

## 2014-04-13 MED ORDER — ALBUTEROL SULFATE (5 MG/ML) 0.5% IN NEBU: 2.5000 mg | INHALATION_SOLUTION | Freq: Four times a day (QID) | RESPIRATORY_TRACT | Status: DC | PRN

## 2014-04-13 NOTE — ED Notes (Signed)
Pt stated while transferring her back to triage room that she felt like her blood sugar had dropped so she ate a Tootsie Roll. Pt stated that she checked her own sugar and it was 89.

## 2014-04-13 NOTE — ED Notes (Signed)
Dr. Minna Antis phoned before her arrival to tell us he will be faxing lab results from his office from today (her hgb. Is 6.8.  He also states pt. Sees Dr. Marko Plume and Dr. Fuller Plan.

## 2014-04-13 NOTE — ED Notes (Signed)
Pt states that she has been reeving blood transfusions at Dr Denice Paradise office. Pt saw Dr Minna Antis today and Hgb was low.  Pt c/o being tired for week now.

## 2014-04-13 NOTE — ED Provider Notes (Signed)
CSN: [id number]     Arrival date & time 04/13/14  1634 History   First MD Initiated Contact with Patient 04/13/14 1731     Chief Complaint  Patient presents with  . low HGB   . Fatigue     (Consider location/radiation/quality/duration/timing/severity/associated sxs/prior Treatment) Patient is a 72 y.o. female presenting with hematochezia. The history is provided by the patient.  Rectal Bleeding Quality:  Black and tarry and maroon Amount:  Moderate Duration:  1 week Timing:  Constant Progression:  Unchanged Chronicity:  Recurrent Context: hemorrhoids   Similar prior episodes: yes   Relieved by:  Nothing Worsened by:  Nothing tried Associated symptoms: no fever     Past Medical History  Diagnosis Date  . Diabetic ketoacidosis 11/2010  . Myocardial infarction     Chest pain s/p normal cath in 08/2004 then NSTEMI during 11/2010 admission, negative Myoview  . Diabetes mellitus type 2, insulin dependent     initial diagnoses 11/2008  . GERD (gastroesophageal reflux disease)   . Esophageal stricture 06/2004    Dilation 06/2004  . Hypertension   . Supraventricular tachycardia   . history of  pericarditis 12/2002  . Pericardial effusion   . Rheumatoid arthritis(714.0)     On MTX and chronic steroids  . Osteoarthritis   . Asthma     Home 3L O2   . Seizure 11/2008  . Rectal bleeding 11/2010    Large hemorrhoids  . Chronic anemia   . History of blood transfusion   . Diverticulosis   . Anemia   . Family history of anesthesia complication     DAUGHTER HAS NAUSEA   Past Surgical History  Procedure Laterality Date  . Cholecystectomy    . Abdominal hysterectomy    . Incise and drain abcess  09/2011    I&D of peri-rectal abcess per Dr Zella Richer.   . Colonoscopy N/A 01/06/2013    Procedure: COLONOSCOPY;  Surgeon: Ladene Artist, MD;  Location: [facility] ENDOSCOPY;  Service: Endoscopy;  Laterality: N/A;  . Esophagogastroduodenoscopy N/A 02/11/2013    Procedure:  ESOPHAGOGASTRODUODENOSCOPY (EGD);  Surgeon: Irene Shipper, MD;  Location[facility] ENDOSCOPY;  Service: Endoscopy;  Laterality: N/A;   Family History  Problem Relation Age of Onset  . Heart disease Mother   . Stroke Father    History  Substance Use Topics  . Smoking status: Never Smoker   . Smokeless tobacco: Never Used  . Alcohol Use: No   OB History    No data available     Review of Systems  Constitutional: Negative for fever.  Respiratory: Negative for cough and shortness of breath.   Gastrointestinal: Positive for hematochezia.  All other systems reviewed and are negative.     Allergies  Demerol and Penicillins  Home Medications   Prior to Admission medications   Medication Sig Start Date End Date Taking? Authorizing Provider  acetaminophen (TYLENOL) 500 MG tablet Take 500 mg by mouth every 6 (six) hours as needed (fatigue).    Yes Historical Provider, MD  albuterol (PROVENTIL) (5 MG/ML) 0.5% nebulizer solution Take 2.5 mg by nebulization every 6 (six) hours as needed for wheezing or shortness of breath (shortness of breath).  10/08/11  Yes Nishant Dhungel, MD  AMBULATORY NON FORMULARY MEDICATION Continuous O2 @@ 2.5-3 LMP   Yes Historical Provider, MD  clotrimazole-betamethasone (LOTRISONE) cream Apply 1 application topically 2 (two) times daily.  06/01/13  Yes Historical Provider, MD  Coenzyme Q10 (CO Q 10) 100 MG CAPS Take 100  mg by mouth daily.   Yes Historical Provider, MD  docusate sodium (COLACE) 100 MG capsule Take 100 mg by mouth at bedtime.    Yes Historical Provider, MD  DULoxetine (CYMBALTA) 30 MG capsule Take 1 capsule (30 mg total) by mouth daily. 01/11/14  Yes Shanker Kristeen Mans, MD  estrogens, conjugated, (PREMARIN) 0.625 MG tablet Take 0.625 mg by mouth daily.    Yes Historical Provider, MD  FeFum-FePoly-FA-B Cmp-C-Biot (INTEGRA PLUS) CAPS Take 1 capsule by mouth 2 (two) times daily. 03/30/14  Yes Lennis Marion Downer, MD  hydrocortisone (ANUSOL-HC) 25 MG suppository  Insert one suppository daily as needed for hemorrhoids. 08/26/13  Yes Lennis Marion Downer, MD  insulin aspart (NOVOLOG) 100 UNIT/ML injection 0-9 Units, Subcutaneous, 3 times daily with meals CBG < 70: implement hypoglycemia protocol CBG 70 - 120: 0 units CBG 121 - 150: 1 unit CBG 151 - 200: 2 units CBG 201 - 250: 3 units CBG 251 - 300: 5 units CBG 301 - 350: 7 units CBG 351 - 400: 9 units CBG > 400: call MD 01/11/14  Yes Shanker Kristeen Mans, MD  insulin detemir (LEVEMIR) 100 UNIT/ML injection Inject 0.1 mLs (10 Units total) into the skin daily at 12 noon. Patient taking differently: Inject 20 Units into the skin daily at 12 noon.  01/12/14  Yes Verlee Monte, MD  ipratropium (ATROVENT) 0.06 % nasal spray Place 1 spray into both nostrils 4 (four) times daily.  03/03/14  Yes Historical Provider, MD  levalbuterol (XOPENEX HFA) 45 MCG/ACT inhaler Inhale 1-2 puffs into the lungs every 4 (four) hours as needed for wheezing (wheezing).    Yes Historical Provider, MD  lisinopril (PRINIVIL,ZESTRIL) 20 MG tablet Take 20 mg by mouth daily. 02/22/14  Yes Historical Provider, MD  LORazepam (ATIVAN) 0.5 MG tablet Take 1 tablet (0.5 mg total) by mouth at bedtime. 01/11/14  Yes Shanker Kristeen Mans, MD  metoprolol (TOPROL XL) 100 MG 24 hr tablet Take 100 mg by mouth daily.    Yes Historical Provider, MD  Multiple Vitamin (MULTIVITAMINS PO) Take 1 tablet by mouth. Daily.    Yes Historical Provider, MD  naproxen sodium (ANAPROX) 220 MG tablet Take 220 mg by mouth 2 (two) times daily as needed (pain).    Yes Historical Provider, MD  predniSONE (DELTASONE) 10 MG tablet Take 10 mg by mouth daily.   Yes Historical Provider, MD  pregabalin (LYRICA) 75 MG capsule Take 75 mg by mouth daily.    Yes Historical Provider, MD  risperiDONE (RISPERDAL) 0.5 MG tablet Take 0.5 tablets (0.25 mg total) by mouth 2 (two) times daily. 01/11/14  Yes Shanker Kristeen Mans, MD  Vitamin D-Vitamin K (VITAMIN K2-VITAMIN D3 PO) Take 1 tablet by mouth  daily.   Yes Historical Provider, MD  amLODipine (NORVASC) 10 MG tablet Take 1 tablet (10 mg total) by mouth daily. 01/11/14   Shanker Kristeen Mans, MD  feeding supplement, ENSURE COMPLETE, (ENSURE COMPLETE) LIQD Take 237 mLs by mouth 2 (two) times daily between meals. Patient not taking: Reported on 03/28/2014 01/11/14   Jonetta Osgood, MD  promethazine (PHENERGAN) 12.5 MG tablet Take 12.5 mg by mouth every 6 (six) hours as needed for nausea.    Historical Provider, MD   BP 180/63 mmHg  Pulse 82  Temp(Src) 98.2 F (36.8 C) (Oral)  Resp 19  SpO2 95% Physical Exam  Constitutional: She is oriented to person, place, and time. She appears well-developed and well-nourished. No distress.  HENT:  Head: Normocephalic and  atraumatic.  Mouth/Throat: Oropharynx is clear and moist. No oropharyngeal exudate.  Eyes: EOM are normal. Pupils are equal, round, and reactive to light.  Neck: Normal range of motion. Neck supple.  Cardiovascular: Normal rate and regular rhythm.  Exam reveals no friction rub.   No murmur heard. Pulmonary/Chest: Effort normal and breath sounds normal. No respiratory distress. She has no wheezes. She has no rales.  Abdominal: Soft. She exhibits no distension. There is no tenderness. There is no rebound.  Genitourinary: Rectal exam shows external hemorrhoid (multiple, soft, not firm/swollen).  Musculoskeletal: Normal range of motion. She exhibits no edema.  Neurological: She is alert and oriented to person, place, and time.  Skin: No rash noted. She is not diaphoretic.  Nursing note and vitals reviewed.   ED Course  Procedures (including critical care time) Labs Review Labs Reviewed  CBG MONITORING, ED - Abnormal; Notable for the following:    Glucose-Capillary 130 (*)    All other components within normal limits  CBC  BASIC METABOLIC PANEL  CBG MONITORING, ED    Imaging Review No results found.   EKG Interpretation None      MDM   Final diagnoses:   Symptomatic anemia    52F here with rectal bleeding. Had recent Hgb today that was 6.8. Hx of similar rectal bleeding, workup revealed iron deficiency anemia, had full evaluation by GI last year. Here with some weakness, stable vitals. Will plan for labs, Hemoccult. Hemoccult negative here. Hgb of 7.1. Patient likely symptomatic from her anemia. Dr. Hal Hope suggested transfusion of 1 unit of blood, which I ordered.  Evelina Bucy, MD 04/14/14 [phone]

## 2014-04-13 NOTE — H&P (Addendum)
[facility] History and Physical  Marisa [name] LGX:[id number] DOB: 1940-09-27 DOA: 04/13/2014  Referring physician: ER physician. PCP: Tommy Medal, MD   Chief Complaint: Referred by primary care secondary to anemia.  HPI: Marisa [name] is a 73 y.o. female with history of rheumatoid arthritis, diabetes mellitus type 2, asthma, chronic anemia, hypertension had followed up with her primary care physician and at that time in the office patient was found to have a hemoglobin around 6.8 and was referred to the ER. Hemoglobin in the ER was around 7.1. Stool for occult blood was negative as per the ER physician Dr. Mingo Amber. Patient has been having weakness on exertion over the last 1 week. Patient has noticed off and on some blood streaks in her stools. Patient has had previous history of GI workup last year for blood loss anemia and at that time EGD was unremarkable and colonoscopy showed hemorrhoids and diverticulosis. Patient is presently hemodynamically stable and admitted for symptomatic anemia. Patient denies any shortness of breath or chest pain. Patient denies taking any NSAIDs though medication list does mention naproxen.   Review of Systems: As presented in the history of presenting illness, rest negative.  Past Medical History  Diagnosis Date  . Diabetic ketoacidosis 11/2010  . Myocardial infarction     Chest pain s/p normal cath in 08/2004 then NSTEMI during 11/2010 admission, negative Myoview  . Diabetes mellitus type 2, insulin dependent     initial diagnoses 11/2008  . GERD (gastroesophageal reflux disease)   . Esophageal stricture 06/2004    Dilation 06/2004  . Hypertension   . Supraventricular tachycardia   . history of  pericarditis 12/2002  . Pericardial effusion   . Rheumatoid arthritis(714.0)     On MTX and chronic steroids  . Osteoarthritis   . Asthma     Home 3L O2   . Seizure 11/2008  . Rectal bleeding 11/2010    Large hemorrhoids  . Chronic anemia   . History  of blood transfusion   . Diverticulosis   . Anemia   . Family history of anesthesia complication     DAUGHTER HAS NAUSEA   Past Surgical History  Procedure Laterality Date  . Cholecystectomy    . Abdominal hysterectomy    . Incise and drain abcess  09/2011    I&D of peri-rectal abcess per Dr Zella Richer.   . Colonoscopy N/A 01/06/2013    Procedure: COLONOSCOPY;  Surgeon: Ladene Artist, MD;  Location[facility] ENDOSCOPY;  Service: Endoscopy;  Laterality: N/A;  . Esophagogastroduodenoscopy N/A 02/11/2013    Procedure: ESOPHAGOGASTRODUODENOSCOPY (EGD);  Surgeon: Irene Shipper, MD;  Location: [facility] ENDOSCOPY;  Service: Endoscopy;  Laterality: N/A;   Social History:  reports that she has never smoked. She has never used smokeless tobacco. She reports that she does not drink alcohol or use illicit drugs. Where does patient live home. Can patient participate in ADLs? Yes.  Allergies  Allergen Reactions  . Demerol [Meperidine] Other (See Comments)    Hallucinations  . Penicillins Swelling and Other (See Comments)    Throat swelling    Family History:  Family History  Problem Relation Age of Onset  . Heart disease Mother   . Stroke Father       Prior to Admission medications   Medication Sig Start Date End Date Taking? Authorizing Provider  acetaminophen (TYLENOL) 500 MG tablet Take 500 mg by mouth every 6 (six) hours as needed (fatigue).    Yes Historical Provider, MD  albuterol (PROVENTIL) (  5 MG/ML) 0.5% nebulizer solution Take 2.5 mg by nebulization every 6 (six) hours as needed for wheezing or shortness of breath (shortness of breath).  10/08/11  Yes Nishant Dhungel, MD  AMBULATORY NON FORMULARY MEDICATION Continuous O2 @@ 2.5-3 LMP   Yes Historical Provider, MD  clotrimazole-betamethasone (LOTRISONE) cream Apply 1 application topically 2 (two) times daily.  06/01/13  Yes Historical Provider, MD  Coenzyme Q10 (CO Q 10) 100 MG CAPS Take 100 mg by mouth daily.   Yes Historical Provider, MD   docusate sodium (COLACE) 100 MG capsule Take 100 mg by mouth at bedtime.    Yes Historical Provider, MD  DULoxetine (CYMBALTA) 30 MG capsule Take 1 capsule (30 mg total) by mouth daily. 01/11/14  Yes Shanker Kristeen Mans, MD  estrogens, conjugated, (PREMARIN) 0.625 MG tablet Take 0.625 mg by mouth daily.    Yes Historical Provider, MD  FeFum-FePoly-FA-B Cmp-C-Biot (INTEGRA PLUS) CAPS Take 1 capsule by mouth 2 (two) times daily. 03/30/14  Yes Lennis Marion Downer, MD  hydrocortisone (ANUSOL-HC) 25 MG suppository Insert one suppository daily as needed for hemorrhoids. 08/26/13  Yes Lennis Marion Downer, MD  insulin aspart (NOVOLOG) 100 UNIT/ML injection 0-9 Units, Subcutaneous, 3 times daily with meals CBG < 70: implement hypoglycemia protocol CBG 70 - 120: 0 units CBG 121 - 150: 1 unit CBG 151 - 200: 2 units CBG 201 - 250: 3 units CBG 251 - 300: 5 units CBG 301 - 350: 7 units CBG 351 - 400: 9 units CBG > 400: call MD 01/11/14  Yes Shanker Kristeen Mans, MD  insulin detemir (LEVEMIR) 100 UNIT/ML injection Inject 0.1 mLs (10 Units total) into the skin daily at 12 noon. Patient taking differently: Inject 20 Units into the skin daily at 12 noon.  01/12/14  Yes Verlee Monte, MD  ipratropium (ATROVENT) 0.06 % nasal spray Place 1 spray into both nostrils 4 (four) times daily.  03/03/14  Yes Historical Provider, MD  levalbuterol (XOPENEX HFA) 45 MCG/ACT inhaler Inhale 1-2 puffs into the lungs every 4 (four) hours as needed for wheezing (wheezing).    Yes Historical Provider, MD  lisinopril (PRINIVIL,ZESTRIL) 20 MG tablet Take 20 mg by mouth daily. 02/22/14  Yes Historical Provider, MD  LORazepam (ATIVAN) 0.5 MG tablet Take 1 tablet (0.5 mg total) by mouth at bedtime. 01/11/14  Yes Shanker Kristeen Mans, MD  metoprolol (TOPROL XL) 100 MG 24 hr tablet Take 100 mg by mouth daily.    Yes Historical Provider, MD  Multiple Vitamin (MULTIVITAMINS PO) Take 1 tablet by mouth. Daily.    Yes Historical Provider, MD  naproxen sodium  (ANAPROX) 220 MG tablet Take 220 mg by mouth 2 (two) times daily as needed (pain).    Yes Historical Provider, MD  predniSONE (DELTASONE) 10 MG tablet Take 10 mg by mouth daily.   Yes Historical Provider, MD  pregabalin (LYRICA) 75 MG capsule Take 75 mg by mouth daily.    Yes Historical Provider, MD  risperiDONE (RISPERDAL) 0.5 MG tablet Take 0.5 tablets (0.25 mg total) by mouth 2 (two) times daily. 01/11/14  Yes Shanker Kristeen Mans, MD  Vitamin D-Vitamin K (VITAMIN K2-VITAMIN D3 PO) Take 1 tablet by mouth daily.   Yes Historical Provider, MD  amLODipine (NORVASC) 10 MG tablet Take 1 tablet (10 mg total) by mouth daily. 01/11/14   Shanker Kristeen Mans, MD  feeding supplement, ENSURE COMPLETE, (ENSURE COMPLETE) LIQD Take 237 mLs by mouth 2 (two) times daily between meals. Patient not taking: Reported on 03/28/2014  01/11/14   Shanker Kristeen Mans, MD  promethazine (PHENERGAN) 12.5 MG tablet Take 12.5 mg by mouth every 6 (six) hours as needed for nausea.    Historical Provider, MD    Physical Exam: Filed Vitals:   04/13/14 1641 04/13/14 1907 04/13/14 2054 04/13/14 2118  BP: 180/63 195/84 167/83 170/80  Pulse: 82 70 89 64  Temp: 98.2 F (36.8 C)     TempSrc: Oral     Resp: 19 20 18 20   SpO2: 95% 98% 100% 100%     General:  Moderately built and nourished.  Eyes: Mild pallor no icterus.  ENT: No discharge from the ears eyes nose or mouth.  Neck: No mass felt.  Cardiovascular: S1-S2 heard.  Respiratory: No rhonchi or crepitations.  Abdomen: Soft nontender bowel sounds present.  Skin: No rash.  Musculoskeletal: No edema.  Psychiatric: Appears normal.  Neurologic: Alert awake oriented to time place and person. Moves all extremities.  Labs on Admission:  Basic Metabolic Panel:  Recent Labs Lab 04/13/14 1745  NA 138  K 4.6  CL 97  CO2 35*  GLUCOSE 150*  BUN 15  CREATININE 1.18*  CALCIUM 8.3*   Liver Function Tests: No results for input(s): AST, ALT, ALKPHOS, BILITOT, PROT,  ALBUMIN in the last 168 hours. No results for input(s): LIPASE, AMYLASE in the last 168 hours. No results for input(s): AMMONIA in the last 168 hours. CBC:  Recent Labs Lab 04/13/14 1745  WBC 11.0*  HGB 7.1*  HCT 23.1*  MCV 94.3  PLT 318   Cardiac Enzymes: No results for input(s): CKTOTAL, CKMB, CKMBINDEX, TROPONINI in the last 168 hours.  BNP (last 3 results) No results for input(s): PROBNP in the last 8760 hours. CBG:  Recent Labs Lab 04/13/14 1719  GLUCAP 130*    Radiological Exams on Admission: No results found.  EKG: Independently reviewed. Sinus tachycardia.  Assessment/Plan Principal Problem:   Symptomatic anemia Active Problems:   Diabetes mellitus type 2, controlled   Rheumatoid arthritis   Essential hypertension   1. Symptomatic anemia - since patient is symptomatic with hemoglobin around 7.1 we have ordered 1 unit of packed red blood cell transfusion and we will get physical therapy consult. If patient's symptoms improved with transfusion then may be discharged home. Troponins are pending. Patient has described in history of present illness as had colonoscopy and EGD last year. Patient has had anemia panel done last week which was unremarkable. For now I have Patient on clear liquids in case patient requires any procedure if patient's hemoglobin does not increase appropriately after transfusion. 2. Hypertension - continue home medications. 3. Diabetes mellitus type 2 - continue home medications with sliding scale coverage. 4. Bronchial asthma - presently not wheezing. 5. Rheumatoid arthritis - continue present medications.  Addendum - patient started developing headache and blood pressure was found to be significantly elevated with systolic blood pressure in 694 by diastolic 854. On exam patient was nonfocal and I have ordered 1 dose of morphine 1 mg IV and hydralazine 10 mg IV. Following which patient blood pressure has gradually declined and presently is  around 627 systolic with diastolic around 03J. Since patient had complained of some chest discomfort we will cycle cardiac markers. EKG does not show any definite changes.   DVT Prophylaxis SCDs.  Code Status: Full code.  Family Communication: Patient's husband at the bedside.  Disposition Plan: Admit for observation.    Marisa Copen N. [facility] Pager [phone].  If 7PM-7AM, please contact night-coverage [email or web address]  Password [email or web address] 04/13/2014, 10:05 PM

## 2014-04-14 ENCOUNTER — Encounter (HOSPITAL_COMMUNITY): Payer: Self-pay | Admitting: *Deleted

## 2014-04-14 DIAGNOSIS — I10 Essential (primary) hypertension: Secondary | ICD-10-CM

## 2014-04-14 DIAGNOSIS — M069 Rheumatoid arthritis, unspecified: Secondary | ICD-10-CM | POA: Diagnosis not present

## 2014-04-14 DIAGNOSIS — D649 Anemia, unspecified: Secondary | ICD-10-CM | POA: Diagnosis not present

## 2014-04-14 DIAGNOSIS — E119 Type 2 diabetes mellitus without complications: Secondary | ICD-10-CM

## 2014-04-14 DIAGNOSIS — R609 Edema, unspecified: Secondary | ICD-10-CM | POA: Diagnosis not present

## 2014-04-14 LAB — COMPREHENSIVE METABOLIC PANEL
ALBUMIN: 3.2 g/dL — AB (ref 3.5–5.2)
ALK PHOS: 96 U/L (ref 39–117)
ALT: 12 U/L (ref 0–35)
AST: 17 U/L (ref 0–37)
Anion gap: 5 (ref 5–15)
BILIRUBIN TOTAL: 0.5 mg/dL (ref 0.3–1.2)
BUN: 11 mg/dL (ref 6–23)
CO2: 36 mmol/L — AB (ref 19–32)
Calcium: 8.3 mg/dL — ABNORMAL LOW (ref 8.4–10.5)
Chloride: 97 mEq/L (ref 96–112)
Creatinine, Ser: 0.87 mg/dL (ref 0.50–1.10)
GFR calc Af Amer: 75 mL/min — ABNORMAL LOW (ref >90)
GFR calc non Af Amer: 65 mL/min — ABNORMAL LOW (ref >90)
Glucose, Bld: 84 mg/dL (ref 70–99)
Potassium: 4.1 mmol/L (ref 3.5–5.1)
SODIUM: 138 mmol/L (ref 135–145)
Total Protein: 5.7 g/dL — ABNORMAL LOW (ref 6.0–8.3)

## 2014-04-14 LAB — GLUCOSE, CAPILLARY
GLUCOSE-CAPILLARY: 104 mg/dL — AB (ref 70–99)
GLUCOSE-CAPILLARY: 136 mg/dL — AB (ref 70–99)
GLUCOSE-CAPILLARY: 44 mg/dL — AB (ref 70–99)
GLUCOSE-CAPILLARY: 72 mg/dL (ref 70–99)
Glucose-Capillary: 78 mg/dL (ref 70–99)
Glucose-Capillary: 149 mg/dL — ABNORMAL HIGH (ref 70–99)
Glucose-Capillary: 171 mg/dL — ABNORMAL HIGH (ref 70–99)

## 2014-04-14 LAB — CBC WITH DIFFERENTIAL/PLATELET
BASOS ABS: 0 10*3/uL (ref 0.0–0.1)
Basophils Relative: 0 % (ref 0–1)
Eosinophils Absolute: 0.2 10*3/uL (ref 0.0–0.7)
Eosinophils Relative: 1 % (ref 0–5)
HEMATOCRIT: 28.6 % — AB (ref 36.0–46.0)
HEMOGLOBIN: 9.2 g/dL — AB (ref 12.0–15.0)
LYMPHS ABS: 1.2 10*3/uL (ref 0.7–4.0)
LYMPHS PCT: 11 % — AB (ref 12–46)
MCH: 29.6 pg (ref 26.0–34.0)
MCHC: 32.2 g/dL (ref 30.0–36.0)
MCV: 92 fL (ref 78.0–100.0)
MONOS PCT: 15 % — AB (ref 3–12)
Monocytes Absolute: 1.7 10*3/uL — ABNORMAL HIGH (ref 0.1–1.0)
NEUTROS ABS: 8.7 10*3/uL — AB (ref 1.7–7.7)
Neutrophils Relative %: 73 % (ref 43–77)
Platelets: 299 10*3/uL (ref 150–400)
RBC: 3.11 MIL/uL — ABNORMAL LOW (ref 3.87–5.11)
RDW: 14.9 % (ref 11.5–15.5)
WBC: 11.9 10*3/uL — ABNORMAL HIGH (ref 4.0–10.5)

## 2014-04-14 LAB — TSH: TSH: 1.186 u[IU]/mL (ref 0.350–4.500)

## 2014-04-14 LAB — OCCULT BLOOD, POC DEVICE
FECAL OCCULT BLD: NEGATIVE
Fecal Occult Bld: NEGATIVE

## 2014-04-14 LAB — TROPONIN I
TROPONIN I: 0.04 ng/mL — AB (ref <0.031)
Troponin I: 0.03 ng/mL (ref <0.031)

## 2014-04-14 MED ORDER — HYDRALAZINE HCL 20 MG/ML IJ SOLN
10.0000 mg | Freq: Once | INTRAMUSCULAR | Status: AC
  Administered 2014-04-14: 10 mg via INTRAVENOUS
  Filled 2014-04-14: qty 1

## 2014-04-14 MED ORDER — FAMOTIDINE 20 MG PO TABS
20.0000 mg | ORAL_TABLET | Freq: Every day | ORAL | Status: DC
  Administered 2014-04-14: 20 mg via ORAL
  Filled 2014-04-14: qty 1

## 2014-04-14 MED ORDER — HYDRALAZINE HCL 20 MG/ML IJ SOLN
10.0000 mg | INTRAMUSCULAR | Status: DC | PRN
  Administered 2014-04-14: 10 mg via INTRAVENOUS
  Filled 2014-04-14: qty 1

## 2014-04-14 MED ORDER — MORPHINE SULFATE 2 MG/ML IJ SOLN
1.0000 mg | Freq: Once | INTRAMUSCULAR | Status: AC
Start: 2014-04-14 — End: 2014-04-14
  Administered 2014-04-14: 1 mg via INTRAVENOUS
  Filled 2014-04-14: qty 1

## 2014-04-14 MED ORDER — ALUM & MAG HYDROXIDE-SIMETH 200-200-20 MG/5ML PO SUSP
15.0000 mL | Freq: Four times a day (QID) | ORAL | Status: DC | PRN
  Administered 2014-04-14 (×2): 15 mL via ORAL
  Filled 2014-04-14 (×2): qty 30

## 2014-04-14 MED ORDER — ALUM HYDROXIDE-MAG TRISILICATE 80-20 MG PO CHEW
2.0000 | CHEWABLE_TABLET | Freq: Four times a day (QID) | ORAL | Status: DC | PRN
  Filled 2014-04-14: qty 2

## 2014-04-14 NOTE — Significant Event (Signed)
Rapid Response Event Note  Overview: Time Called: 0015 Arrival Time: 0020 Event Type: Other (Comment), Cardiac (HTN)  Initial Focused Assessment:PT WITH INCREASED BP, CHEST DISCOMFORT AND HEADACHE. ALERT AND ORIENTED.    Interventions:PT HAD RECEIVED NORVASC AND MORPHINE--WAS GIVEN HYDRALAZINE ON ARRIVAL. EKG DONE. MANUAL BP MUCH HIGHER THAN AUTOMATIC BP. V/S TAKEN FREQUENTLY AND STARTED BLOOD THAT WAS  ORDERED. MAALOX FOR CHEST DISCOMFORT WITH RELIEF--LABS TO BE DRAWN    Event Summary:  at [place] NOTIFIED    at    Outcome: Stayed in room and stabalized  Event End Time: 0145  Pricilla Riffle

## 2014-04-14 NOTE — Progress Notes (Signed)
Hypoglycemic Event  CBG:44  Treatment: 15 GM carbohydrate snack  Symptoms: Sweaty  Follow-up CBG: Time:**0506* CBG Result:*78**  Possible Reasons for Event: Inadequate meal intake  Comments/MD otified:*did progress note    Marisa [name], Marisa [name]  Remember to initiate Hypoglycemia Order Set & complete

## 2014-04-14 NOTE — Evaluation (Addendum)
Physical Therapy One Time Evaluation Patient Details Name: Marisa [name] MRN: [id number] DOB: [birth year] Today's Date: 04/14/2014   History of Present Illness  73 y.o. female with history of rheumatoid arthritis, diabetes mellitus type 2, asthma, chronic anemia, hypertension admitted for symptomatic anemia.  Clinical Impression  Patient evaluated by Physical Therapy with no further acute PT needs identified. All education has been completed and the patient has no further questions. Pt about to ambulate in hallway and reports no dizziness however mildly SOB upon return to room with SpO2 100% on 3L O2.  Pt reports feeling near her baseline and hopes to d/c home today. See below for any follow-up Physial Therapy or equipment needs. PT is signing off. Thank you for this referral.     Follow Up Recommendations No PT follow up    Equipment Recommendations  None recommended by PT    Recommendations for Other Services       Precautions / Restrictions Precautions Precautions: Fall      Mobility  Bed Mobility Overal bed mobility: Modified Independent                Transfers Overall transfer level: Needs assistance Equipment used: None Transfers: Sit to/from Stand Sit to Stand: Supervision         General transfer comment: verbal cues for safety with lines, pt reports slight dizziness upon standing however resolved prior to ambulating  Ambulation/Gait Ambulation/Gait assistance: Min guard;Supervision Ambulation Distance (Feet): 50 Feet Assistive device: None Gait Pattern/deviations: Step-through pattern;Decreased stride length;Narrow base of support     General Gait Details: pt pushed IV pole for support (typically uses SPC), ambulated on 3L O2 with SpO2 100% upon return to room however pt reporting mild SOB, pt reports she feels near her baseline  Stairs            Wheelchair Mobility    Modified Rankin (Stroke Patients Only)       Balance                                              Pertinent Vitals/Pain Pain Assessment: No/denies pain    Home Living Family/patient expects to be discharged to:: Private residence Living Arrangements: Spouse/significant other   Type of Home: House Home Access: Stairs to enter Entrance Stairs-Rails: Psychiatric nurse of Steps: 6 Home Layout: One level Home Equipment: Environmental consultant - 2 wheels;Cane - single point;Transport chair;Shower seat      Prior Function Level of Independence: Independent with assistive device(s)         Comments: used a single point cane and home oxygen 2.5 liters     Hand Dominance        Extremity/Trunk Assessment               Lower Extremity Assessment: Overall WFL for tasks assessed         Communication   Communication: No difficulties  Cognition Arousal/Alertness: Awake/alert Behavior During Therapy: WFL for tasks assessed/performed Overall Cognitive Status: Within Functional Limits for tasks assessed                      General Comments      Exercises        Assessment/Plan    PT Assessment Patent does not need any further PT services  PT Diagnosis     PT Problem List  PT Treatment Interventions     PT Goals (Current goals can be found in the Care Plan section) Acute Rehab PT Goals PT Goal Formulation: All assessment and education complete, DC therapy    Frequency     Barriers to discharge        Co-evaluation               End of Session Equipment Utilized During Treatment: Oxygen Activity Tolerance: Patient tolerated treatment well Patient left: in bed;with call bell/phone within reach;with family/visitor present      Functional Assessment Tool Used: clinical judgement Functional Limitation: Mobility: Walking and moving around Mobility: Walking and Moving Around Current Status ([id number]): At least 1 percent but less than 20 percent impaired, limited or  restricted Mobility: Walking and Moving Around Goal Status [phone]): At least 1 percent but less than 20 percent impaired, limited or restricted Mobility: Walking and Moving Around Discharge Status [phone]): At least 1 percent but less than 20 percent impaired, limited or restricted    Time: 1003-1017 PT Time Calculation (min) (ACUTE ONLY): 14 min   Charges:   PT Evaluation $Initial PT Evaluation Tier I: 1 Procedure PT Treatments $Gait Training: 8-22 mins   PT G Codes:   PT G-Codes **NOT FOR INPATIENT CLASS** Functional Assessment Tool Used: clinical judgement Functional Limitation: Mobility: Walking and moving around Mobility: Walking and Moving Around Current Status ([id number]): At least 1 percent but less than 20 percent impaired, limited or restricted Mobility: Walking and Moving Around Goal Status [phone]): At least 1 percent but less than 20 percent impaired, limited or restricted Mobility: Walking and Moving Around Discharge Status [phone]): At least 1 percent but less than 20 percent impaired, limited or restricted    Nuriyah Hanline,KATHrine E 04/14/2014, 12:18 PM Carmelia Bake, PT, DPT 04/14/2014 Pager: [phone]

## 2014-04-14 NOTE — Progress Notes (Signed)
UR completed 

## 2014-04-14 NOTE — Discharge Summary (Signed)
Physician Discharge Summary  Marisa [name] JWJ:[id number] DOB: 17-Mar-1941 DOA: 04/13/2014  PCP: Tommy Medal, MD  Admit date: 04/13/2014 Discharge date: 04/14/2014  Time spent: 45 minutes  Recommendations for Outpatient Follow-up:  1. F/u Hb in 2-4 wks  Discharge Condition: stable Diet recommendation: diabetic diet, heart healthy  Discharge Diagnoses:  Principal Problem:   Symptomatic anemia Active Problems:   Diabetes mellitus type 2, controlled   Rheumatoid arthritis   Essential hypertension   History of present illness:  Marisa [name] is a 73 y.o. female with history of rheumatoid arthritis, diabetes mellitus type 2, asthma, chronic anemia, hypertension had followed up with her primary care physician and at that time in the office patient was found to have a hemoglobin around 6.8 and was referred to the ER. Hemoglobin in the ER was around 7.1. Stool for occult blood was negative as per the ER physician Dr. Mingo Amber. Patient has been having weakness on exertion over the last 1 week. Patient has noticed off and on some blood streaks in her stools. Patient has had previous history of GI workup last year for blood loss anemia and at that time EGD was unremarkable and colonoscopy showed hemorrhoids and diverticulosis. Patient is presently hemodynamically stable and admitted for symptomatic anemia. Patient denies any shortness of breath or chest pain. Patient denies taking any NSAIDs though medication list does mention naproxen.   Hospital Course:   Anemia -Possibly secondary to chronic blood loss in relation to hemorrhoidal bleed -Has been transfused 1 unit packed red blood cells and hemoglobin has improved from 7.1 to 9.2 -Stool for occult blood negative in the ER  Uncontrolled hypertension -Blood pressure in the ER noted to be significantly elevated at 260/120 and improved with IV hydralazine and a dose of IV morphine (patient also had a headache at the time)  Slightly  elevated troponin -Second troponin is elevated at 0.04 but thrid (drawn 1 hr later) is normal at 0.03 -elevated Troponin may be in relation to above-mentioned hypertension and possibly anemia resulting in cardiac strain-patient has not had any chest pain -EKG on admission revealed sinus tachycardia without any ST or T-wave changes  Hypoglycemia/diabetes mellitus type 2 insulin requiring  -Patient's sugars were noted to be slightly low last evening-the patient states that this is not uncommon for her if she has not had anything to eat-as she spent most of yesterday evening in the ER she was NPO -Hypoglycemia has resolved and she is eating well    Procedures: None  Consultations: None  Discharge Exam: Filed Weights   04/13/14 2130  Weight: 56.246 kg (124 lb)   Filed Vitals:   04/14/14 0939  BP: 149/50  Pulse: 92  Temp:   Resp:     General: AAO x 3, no distress Cardiovascular: RRR, no murmurs  Respiratory: clear to auscultation bilaterally GI: soft, non-tender, non-distended, bowel sound positive  Discharge Instructions You were cared for by a hospitalist during your hospital stay. If you have any questions about your discharge medications or the care you received while you were in the hospital after you are discharged, you can call the unit and asked to speak with the hospitalist on call if the hospitalist that took care of you is not available. Once you are discharged, your primary care physician will handle any further medical issues. Please note that NO REFILLS for any discharge medications will be authorized once you are discharged, as it is imperative that you return to your primary care physician (or establish a  relationship with a primary care physician if you do not have one) for your aftercare needs so that they can reassess your need for medications and monitor your lab values.     Medication List    STOP taking these medications        naproxen sodium 220 MG tablet   Commonly known as:  ANAPROX      TAKE these medications        acetaminophen 500 MG tablet  Commonly known as:  TYLENOL  Take 500 mg by mouth every 6 (six) hours as needed (fatigue).     albuterol (5 MG/ML) 0.5% nebulizer solution  Commonly known as:  PROVENTIL  Take 2.5 mg by nebulization every 6 (six) hours as needed for wheezing or shortness of breath (shortness of breath).     AMBULATORY NON FORMULARY MEDICATION  Continuous O2 @@ 2.5-3 LMP     amLODipine 10 MG tablet  Commonly known as:  NORVASC  Take 1 tablet (10 mg total) by mouth daily.     clotrimazole-betamethasone cream  Commonly known as:  LOTRISONE  Apply 1 application topically 2 (two) times daily.     Co Q 10 100 MG Caps  Take 100 mg by mouth daily.     docusate sodium 100 MG capsule  Commonly known as:  COLACE  Take 100 mg by mouth at bedtime.     DULoxetine 30 MG capsule  Commonly known as:  CYMBALTA  Take 1 capsule (30 mg total) by mouth daily.     estrogens (conjugated) 0.625 MG tablet  Commonly known as:  PREMARIN  Take 0.625 mg by mouth daily.     feeding supplement (ENSURE COMPLETE) Liqd  Take 237 mLs by mouth 2 (two) times daily between meals.     hydrocortisone 25 MG suppository  Commonly known as:  ANUSOL-HC  Insert one suppository daily as needed for hemorrhoids.     insulin aspart 100 UNIT/ML injection  Commonly known as:  novoLOG  - 0-9 Units, Subcutaneous, 3 times daily with meals  - CBG < 70: implement hypoglycemia protocol  - CBG 70 - 120: 0 units  - CBG 121 - 150: 1 unit  - CBG 151 - 200: 2 units  - CBG 201 - 250: 3 units  - CBG 251 - 300: 5 units  - CBG 301 - 350: 7 units  - CBG 351 - 400: 9 units  - CBG > 400: call MD     insulin detemir 100 UNIT/ML injection  Commonly known as:  LEVEMIR  Inject 0.1 mLs (10 Units total) into the skin daily at 12 noon.     INTEGRA PLUS Caps  Take 1 capsule by mouth 2 (two) times daily.     ipratropium 0.06 % nasal spray   Commonly known as:  ATROVENT  Place 1 spray into both nostrils 4 (four) times daily.     levalbuterol 45 MCG/ACT inhaler  Commonly known as:  XOPENEX HFA  Inhale 1-2 puffs into the lungs every 4 (four) hours as needed for wheezing (wheezing).     lisinopril 20 MG tablet  Commonly known as:  PRINIVIL,ZESTRIL  Take 20 mg by mouth daily.     LORazepam 0.5 MG tablet  Commonly known as:  ATIVAN  Take 1 tablet (0.5 mg total) by mouth at bedtime.     LYRICA 75 MG capsule  Generic drug:  pregabalin  Take 75 mg by mouth daily.     MULTIVITAMINS PO  Take  1 tablet by mouth. Daily.     predniSONE 10 MG tablet  Commonly known as:  DELTASONE  Take 10 mg by mouth daily.     promethazine 12.5 MG tablet  Commonly known as:  PHENERGAN  Take 12.5 mg by mouth every 6 (six) hours as needed for nausea.     risperiDONE 0.5 MG tablet  Commonly known as:  RISPERDAL  Take 0.5 tablets (0.25 mg total) by mouth 2 (two) times daily.     TOPROL XL 100 MG 24 hr tablet  Generic drug:  metoprolol succinate  Take 100 mg by mouth daily.     VITAMIN K2-VITAMIN D3 PO  Take 1 tablet by mouth daily.       Allergies  Allergen Reactions  . Demerol [Meperidine] Other (See Comments)    Hallucinations  . Penicillins Swelling and Other (See Comments)    Throat swelling      The results of significant diagnostics from this hospitalization (including imaging, microbiology, ancillary and laboratory) are listed below for reference.    Significant Diagnostic Studies: No results found.  Microbiology: No results found for this or any previous visit (from the past 240 hour(s)).   Labs: Basic Metabolic Panel:  Recent Labs Lab 04/13/14 1745 04/14/14 0730  NA 138 138  K 4.6 4.1  CL 97 97  CO2 35* 36*  GLUCOSE 150* 84  BUN 15 11  CREATININE 1.18* 0.87  CALCIUM 8.3* 8.3*   Liver Function Tests:  Recent Labs Lab 04/14/14 0730  AST 17  ALT 12  ALKPHOS 96  BILITOT 0.5  PROT 5.7*  ALBUMIN  3.2*   No results for input(s): LIPASE, AMYLASE in the last 168 hours. No results for input(s): AMMONIA in the last 168 hours. CBC:  Recent Labs Lab 04/13/14 1745 04/14/14 0730  WBC 11.0* 11.9*  NEUTROABS  --  8.7*  HGB 7.1* 9.2*  HCT 23.1* 28.6*  MCV 94.3 92.0  PLT 318 299   Cardiac Enzymes:  Recent Labs Lab 04/13/14 2047 04/14/14 0730  TROPONINI <0.03 0.04*   BNP: BNP (last 3 results) No results for input(s): PROBNP in the last 8760 hours. CBG:  Recent Labs Lab 04/13/14 2253 04/13/14 2357 04/14/14 0444 04/14/14 0506 04/14/14 0829  GLUCAP 104* 72 44* 78 136*       SignedDebbe Odea, MD [facility] 04/14/2014, 9:54 AM

## 2014-04-14 NOTE — Progress Notes (Signed)
Discharge instructions given to patient.

## 2014-04-14 NOTE — Progress Notes (Signed)
Pt c/o severe headache. BP 260/125 manually. Notified MD.   Then pt started c/o  Chest pain.  Obtained order for 10 mg hydrazaline and 1 mg morphine. Called rapid response nurse. Administered med to pt. Will continue to monitor.

## 2014-04-14 NOTE — Progress Notes (Signed)
Hypoglycemic Event  CBG: **64  Treatment: 15 GM carbohydrate snack  Symptoms: Nervous/irritable  Follow-up CBG: Time:2215 CBG Result:*104**  Possible Reasons for Event: Inadequate meal intake  Comments/MD notified: called later    Marisa [name]  Remember to initiate Hypoglycemia Order Set & complete

## 2014-04-15 LAB — TYPE AND SCREEN
ABO/RH(D): O POS
ANTIBODY SCREEN: NEGATIVE
UNIT DIVISION: 0

## 2014-05-04 DIAGNOSIS — D649 Anemia, unspecified: Secondary | ICD-10-CM | POA: Diagnosis not present

## 2014-05-04 DIAGNOSIS — R350 Frequency of micturition: Secondary | ICD-10-CM | POA: Diagnosis not present

## 2014-05-04 DIAGNOSIS — R5383 Other fatigue: Secondary | ICD-10-CM | POA: Diagnosis not present

## 2014-05-04 DIAGNOSIS — J029 Acute pharyngitis, unspecified: Secondary | ICD-10-CM | POA: Diagnosis not present

## 2014-05-04 DIAGNOSIS — M069 Rheumatoid arthritis, unspecified: Secondary | ICD-10-CM | POA: Diagnosis not present

## 2014-05-18 DIAGNOSIS — Z7952 Long term (current) use of systemic steroids: Secondary | ICD-10-CM | POA: Diagnosis not present

## 2014-05-18 DIAGNOSIS — M545 Low back pain: Secondary | ICD-10-CM | POA: Diagnosis not present

## 2014-05-18 DIAGNOSIS — M0589 Other rheumatoid arthritis with rheumatoid factor of multiple sites: Secondary | ICD-10-CM | POA: Diagnosis not present

## 2014-05-27 ENCOUNTER — Encounter (HOSPITAL_COMMUNITY): Payer: Self-pay | Admitting: Emergency Medicine

## 2014-05-27 ENCOUNTER — Inpatient Hospital Stay (HOSPITAL_COMMUNITY)
Admission: EM | Admit: 2014-05-27 | Discharge: 2014-05-30 | DRG: 565 | Disposition: A | Discharge status: Home / Self Care | Payer: Medicare Other | Attending: Internal Medicine | Admitting: Internal Medicine

## 2014-05-27 ENCOUNTER — Emergency Department (INDEPENDENT_AMBULATORY_CARE_PROVIDER_SITE_OTHER)
Admission: EM | Admit: 2014-05-27 | Discharge: 2014-05-27 | Disposition: A | Discharge status: Nursing Home (Includes ICF) | Payer: Medicare Other | Source: Home / Self Care | Attending: Emergency Medicine | Admitting: Emergency Medicine

## 2014-05-27 ENCOUNTER — Emergency Department (INDEPENDENT_AMBULATORY_CARE_PROVIDER_SITE_OTHER): Payer: Medicare Other

## 2014-05-27 DIAGNOSIS — Z794 Long term (current) use of insulin: Secondary | ICD-10-CM

## 2014-05-27 DIAGNOSIS — E871 Hypo-osmolality and hyponatremia: Secondary | ICD-10-CM | POA: Diagnosis present

## 2014-05-27 DIAGNOSIS — M25511 Pain in right shoulder: Secondary | ICD-10-CM

## 2014-05-27 DIAGNOSIS — E109 Type 1 diabetes mellitus without complications: Secondary | ICD-10-CM | POA: Diagnosis not present

## 2014-05-27 DIAGNOSIS — M2540 Effusion, unspecified joint: Secondary | ICD-10-CM | POA: Diagnosis present

## 2014-05-27 DIAGNOSIS — E119 Type 2 diabetes mellitus without complications: Secondary | ICD-10-CM

## 2014-05-27 DIAGNOSIS — M2550 Pain in unspecified joint: Secondary | ICD-10-CM | POA: Insufficient documentation

## 2014-05-27 DIAGNOSIS — R600 Localized edema: Secondary | ICD-10-CM | POA: Diagnosis not present

## 2014-05-27 DIAGNOSIS — N189 Chronic kidney disease, unspecified: Secondary | ICD-10-CM | POA: Diagnosis present

## 2014-05-27 DIAGNOSIS — Z9981 Dependence on supplemental oxygen: Secondary | ICD-10-CM

## 2014-05-27 DIAGNOSIS — M25419 Effusion, unspecified shoulder: Secondary | ICD-10-CM | POA: Diagnosis present

## 2014-05-27 DIAGNOSIS — D899 Disorder involving the immune mechanism, unspecified: Secondary | ICD-10-CM

## 2014-05-27 DIAGNOSIS — R52 Pain, unspecified: Secondary | ICD-10-CM

## 2014-05-27 DIAGNOSIS — E860 Dehydration: Secondary | ICD-10-CM | POA: Diagnosis present

## 2014-05-27 DIAGNOSIS — E11649 Type 2 diabetes mellitus with hypoglycemia without coma: Secondary | ICD-10-CM | POA: Diagnosis not present

## 2014-05-27 DIAGNOSIS — M069 Rheumatoid arthritis, unspecified: Secondary | ICD-10-CM | POA: Diagnosis not present

## 2014-05-27 DIAGNOSIS — J45909 Unspecified asthma, uncomplicated: Secondary | ICD-10-CM | POA: Diagnosis present

## 2014-05-27 DIAGNOSIS — Z7952 Long term (current) use of systemic steroids: Secondary | ICD-10-CM

## 2014-05-27 DIAGNOSIS — N390 Urinary tract infection, site not specified: Secondary | ICD-10-CM | POA: Diagnosis not present

## 2014-05-27 DIAGNOSIS — E1165 Type 2 diabetes mellitus with hyperglycemia: Secondary | ICD-10-CM

## 2014-05-27 DIAGNOSIS — J441 Chronic obstructive pulmonary disease with (acute) exacerbation: Secondary | ICD-10-CM | POA: Diagnosis not present

## 2014-05-27 DIAGNOSIS — R609 Edema, unspecified: Secondary | ICD-10-CM

## 2014-05-27 DIAGNOSIS — D849 Immunodeficiency, unspecified: Secondary | ICD-10-CM | POA: Insufficient documentation

## 2014-05-27 DIAGNOSIS — M25411 Effusion, right shoulder: Principal | ICD-10-CM | POA: Diagnosis present

## 2014-05-27 DIAGNOSIS — Z888 Allergy status to other drugs, medicaments and biological substances status: Secondary | ICD-10-CM

## 2014-05-27 DIAGNOSIS — Z880 Allergy status to penicillin: Secondary | ICD-10-CM

## 2014-05-27 DIAGNOSIS — R0600 Dyspnea, unspecified: Secondary | ICD-10-CM

## 2014-05-27 DIAGNOSIS — F329 Major depressive disorder, single episode, unspecified: Secondary | ICD-10-CM | POA: Diagnosis present

## 2014-05-27 DIAGNOSIS — M89521 Osteolysis, right upper arm: Secondary | ICD-10-CM | POA: Diagnosis not present

## 2014-05-27 DIAGNOSIS — I129 Hypertensive chronic kidney disease with stage 1 through stage 4 chronic kidney disease, or unspecified chronic kidney disease: Secondary | ICD-10-CM | POA: Diagnosis present

## 2014-05-27 DIAGNOSIS — IMO0001 Reserved for inherently not codable concepts without codable children: Secondary | ICD-10-CM

## 2014-05-27 DIAGNOSIS — IMO0002 Reserved for concepts with insufficient information to code with codable children: Secondary | ICD-10-CM

## 2014-05-27 HISTORY — DX: Unspecified osteoarthritis, unspecified site: M19.90

## 2014-05-27 HISTORY — DX: Rheumatoid vasculitis with rheumatoid arthritis of unspecified site: M05.20

## 2014-05-27 HISTORY — DX: Type 2 diabetes mellitus without complications: E11.9

## 2014-05-27 LAB — CBC WITH DIFFERENTIAL/PLATELET
Basophils Absolute: 0 10*3/uL (ref 0.0–0.1)
Basophils Relative: 0 % (ref 0–1)
EOS ABS: 0 10*3/uL (ref 0.0–0.7)
Eosinophils Relative: 0 % (ref 0–5)
HEMATOCRIT: 28.6 % — AB (ref 36.0–46.0)
Hemoglobin: 9.2 g/dL — ABNORMAL LOW (ref 12.0–15.0)
Lymphocytes Relative: 4 % — ABNORMAL LOW (ref 12–46)
Lymphs Abs: 0.4 10*3/uL — ABNORMAL LOW (ref 0.7–4.0)
MCH: 29 pg (ref 26.0–34.0)
MCHC: 32.2 g/dL (ref 30.0–36.0)
MCV: 90.2 fL (ref 78.0–100.0)
MONOS PCT: 8 % (ref 3–12)
Monocytes Absolute: 0.9 10*3/uL (ref 0.1–1.0)
Neutro Abs: 8.9 10*3/uL — ABNORMAL HIGH (ref 1.7–7.7)
Neutrophils Relative %: 88 % — ABNORMAL HIGH (ref 43–77)
PLATELETS: 250 10*3/uL (ref 150–400)
RBC: 3.17 MIL/uL — AB (ref 3.87–5.11)
RDW: 13.6 % (ref 11.5–15.5)
WBC: 10.2 10*3/uL (ref 4.0–10.5)

## 2014-05-27 LAB — BASIC METABOLIC PANEL
Anion gap: 7 (ref 5–15)
BUN: 15 mg/dL (ref 6–23)
CO2: 32 mmol/L (ref 19–32)
Calcium: 8.3 mg/dL — ABNORMAL LOW (ref 8.4–10.5)
Chloride: 95 mmol/L — ABNORMAL LOW (ref 96–112)
Creatinine, Ser: 1.19 mg/dL — ABNORMAL HIGH (ref 0.50–1.10)
GFR calc non Af Amer: 44 mL/min — ABNORMAL LOW (ref >90)
GFR, EST AFRICAN AMERICAN: 51 mL/min — AB (ref >90)
GLUCOSE: 141 mg/dL — AB (ref 70–99)
Potassium: 4.6 mmol/L (ref 3.5–5.1)
SODIUM: 134 mmol/L — AB (ref 135–145)

## 2014-05-27 LAB — SEDIMENTATION RATE: SED RATE: 71 mm/h — AB (ref 0–22)

## 2014-05-27 LAB — I-STAT CG4 LACTIC ACID, ED
LACTIC ACID, VENOUS: 0.64 mmol/L (ref 0.5–2.0)
Lactic Acid, Venous: 0.55 mmol/L (ref 0.5–2.0)

## 2014-05-27 MED ORDER — HYDROCODONE-ACETAMINOPHEN 5-325 MG PO TABS: 1.0000 | ORAL_TABLET | Freq: Four times a day (QID) | ORAL | Status: DC | PRN

## 2014-05-27 MED ORDER — LORAZEPAM 2 MG/ML IJ SOLN
1.0000 mg | INTRAMUSCULAR | Status: AC | PRN
  Administered 2014-05-27 – 2014-05-28 (×2): 1 mg via INTRAVENOUS
  Filled 2014-05-27 (×2): qty 1

## 2014-05-27 MED ORDER — MORPHINE SULFATE 4 MG/ML IJ SOLN
2.0000 mg | Freq: Once | INTRAMUSCULAR | Status: AC
  Administered 2014-05-27: 2 mg via INTRAVENOUS
  Filled 2014-05-27: qty 1

## 2014-05-27 NOTE — ED Notes (Signed)
MRI informed pt has IV, pt will have to wait aprox 1 hour for scan.

## 2014-05-27 NOTE — ED Notes (Signed)
Pt will be transferred to [facility] via shuttle report given to 1st nurse Cassie RN

## 2014-05-27 NOTE — ED Provider Notes (Signed)
CSN: [id number]     Arrival date & time 05/27/14  1551 History   First MD Initiated Contact with Patient 05/27/14 1703     Chief Complaint  Patient presents with  . Arm Problem  . Arm Swelling   (Consider location/radiation/quality/duration/timing/severity/associated sxs/prior Treatment) HPI Comments: Marisa [name] is a very pleasant 74 yo caucasian female with a past medical history significant for destructive and longstanding RA. She has been on long term Prednisone daily. A week ago she was given a steroid injection in the right arm. She noticed a bruise a few days later and some mild pain. She was in to see her Rheumatologist and he thought just a bruise. She has noted the bruising and pain is worsening and notes decrease ROM of the right arm. She reports that she has not had full ROM in the right shoulder for many years. The swelling is new. No fever or chills. No dyspnea. No pain into forearm or hand  The history is provided by the patient.    Past Medical History  Diagnosis Date  . Asthma   . Hypertension   . Diabetes mellitus without complication    History reviewed. No pertinent past surgical history. History reviewed. No pertinent family history. History  Substance Use Topics  . Smoking status: Never Smoker   . Smokeless tobacco: Not on file  . Alcohol Use: No   OB History    No data available     Review of Systems  Constitutional: Negative for fever.  Respiratory: Negative for cough and shortness of breath.   Musculoskeletal: Positive for joint swelling and arthralgias.  Skin: Positive for color change.  Hematological: Bruises/bleeds easily.    Allergies  Review of patient's allergies indicates no known allergies.  Home Medications   Prior to Admission medications   Medication Sig Start Date End Date Taking? Authorizing Provider  DULoxetine (CYMBALTA) 30 MG capsule Take 30 mg by mouth daily.   Yes Historical Provider, MD  estrogens, conjugated, (PREMARIN)  0.625 MG tablet Take 0.625 mg by mouth daily. Take daily for 21 days then do not take for 7 days.   Yes Historical Provider, MD  leflunomide (ARAVA) 20 MG tablet Take 20 mg by mouth daily.   Yes Historical Provider, MD  lisinopril (PRINIVIL,ZESTRIL) 10 MG tablet Take 10 mg by mouth daily.   Yes Historical Provider, MD  LORazepam (ATIVAN) 1 MG tablet Take 1 mg by mouth every 8 (eight) hours as needed for anxiety.   Yes Historical Provider, MD  metoprolol succinate (TOPROL-XL) 100 MG 24 hr tablet Take 100 mg by mouth daily. Take with or immediately following a meal.   Yes Historical Provider, MD  predniSONE (DELTASONE) 10 MG tablet Take 10 mg by mouth daily with breakfast.   Yes Historical Provider, MD  pregabalin (LYRICA) 75 MG capsule Take 75 mg by mouth 2 (two) times daily.   Yes Historical Provider, MD  ranitidine (ZANTAC) 150 MG tablet Take 150 mg by mouth 2 (two) times daily.   Yes Historical Provider, MD  risperiDONE (RISPERDAL) 0.5 MG tablet Take 0.5 mg by mouth at bedtime.   Yes Historical Provider, MD  traMADol (ULTRAM) 50 MG tablet Take by mouth every 6 (six) hours as needed.   Yes Historical Provider, MD  vitamin C (ASCORBIC ACID) 500 MG tablet Take 500 mg by mouth 2 (two) times daily.   Yes Historical Provider, MD   BP 125/88 mmHg  Pulse 91  Temp(Src) 99.1 F (37.3 C) (Oral)  Resp 16  SpO2 87% Physical Exam  Constitutional: She is oriented to person, place, and time. She appears well-developed and well-nourished.  Wheelchair bound  HENT:  Head: Normocephalic.  Pulmonary/Chest: Effort normal.  Musculoskeletal:  Chronic deformities in the hands and wrist. Right shoulder with soft tissue swelling, ecchymosis. Limited and decreased ROM with crepitus. No forearm swelling, no cyanosis. No joint effusion  Neurological: She is alert and oriented to person, place, and time. She displays normal reflexes.  Skin: Skin is warm and dry.  Psychiatric: Her behavior is normal.  Nursing note  and vitals reviewed.   ED Course  Procedures (including critical care time) Labs Review Labs Reviewed - No data to display  Imaging Review Dg Shoulder Right  05/27/2014   CLINICAL DATA:  Limited motion of the right arm and shoulder.  EXAM: RIGHT SHOULDER - 2+ VIEW  COMPARISON:  None.  FINDINGS: There is osseous destruction of the proximal right humerus involving the humeral head. Additionally there is suggestion of lysis of the distal clavicle and widening of the [facility] joint. There is extensive swelling involving the surrounding soft tissues.  IMPRESSION: Osteolysis of the humeral head. Extensive surrounding soft tissue swelling. Widening of the [facility] joint and suggestion of lysis of the distal clavicle. In the absence of prior comparisons, findings are nonspecific however this may be secondary to infection or potentially diabetic neuropathy. In the acute setting, consider further evaluation with MRI if infection is of concern.  These results were called by telephone at the time of interpretation on 05/27/2014 at 6:37 pm to Dr. Leafy Kindle , who verbally acknowledged these results.   Electronically Signed   By: Lovey Newcomer M.D.   On: 05/27/2014 18:39     MDM   1. Shoulder pain, acute, right    Discussed with Dr. Jake Michaelis. Suspect Bicep tear/longstanding RA and steroid use. No systemic signs of infection are noted; however given over read by Radiologist and acute pain and swelling. Send to ER for further imaging as suggested above.     Bjorn Pippin, PA-C 05/27/14 1859

## 2014-05-27 NOTE — ED Provider Notes (Signed)
CSN: [id number]     Arrival date & time 05/27/14  1906 History  This chart was scribed for non-physician practitioner, Abigail Butts, PA-C, working with Tanna Furry, MD, by Delphia Grates, ED Scribe. This patient was seen in room TR09C/TR09C and the patient's care was started at 7:32 PM.    Chief Complaint  Patient presents with  . Arm Pain    The history is provided by the patient and medical records. No language interpreter was used.     HPI Comments: Marisa [name] is a 74 y.o. female, with history of asthma, HTN, DM, and RA who presents to the Emergency Department complaining of sudden onset right upper arm pain with associated bruising for the past week. Patient was seen at Urgent Care for the same and was informed her pain was related to possible muscle "detachment" and prolonged prednisone use. Patient was informed she was sent here to rule out possible infection. She received a Cortisone injection from her PCP, Dr. Minna Antis, in the muscle of her right arm and right hip approximately 2 week ago. Patient states states she was not have a flare, but was not feeling well during this time. After receiving the injection, patient noticed a "nickel sized" area of redness at the site that progressed into a large bruise, 7-10 days later. She states she was evaluated by her Rheumatologist, Dr. Amil Amen, for this and was informed that it was just a bruise. She reports that since this time, the bruise has gotten progressively worse. Patient states she was mopping that floor yesterday and notes her entire upper arm was "firey red". Patient reports her arm was worse this morning and went to urgent care today due to the intense redness and throbbing pain. Patient notes associated warmth to the area, which has improved, but not resolved. She also notes her arm is tender to the touch in certain areas with associated swelling. Patient takes 10mg  of prednisone daily for the past several years. She reports she  increased her dosage to 20 mg in the last 3 days, however, resumed her normal dosage of 10mg  today. She is currently taking Tramadol with only mild improvement, and reports she was not given any pain medication while at Urgent Care. Patient also notes a knot to the back of the neck that "comes and goes" and is not certain if this is related to her symptoms. Patient is currently taking insulin and is constantly on 2.5 L of O2 at home.     Past Medical History  Diagnosis Date  . Asthma   . Hypertension   . Diabetes mellitus without complication   . Arthritis   . Rheumatoid arteritis    History reviewed. No pertinent past surgical history. No family history on file. History  Substance Use Topics  . Smoking status: Never Smoker   . Smokeless tobacco: Not on file  . Alcohol Use: No   OB History    No data available     Review of Systems  Constitutional: Negative for fever and chills.  Musculoskeletal: Positive for myalgias and arthralgias.  Skin: Positive for color change.      Allergies  Enbrel; Humira; Demerol; and Penicillins  Home Medications   Prior to Admission medications   Medication Sig Start Date End Date Taking? Authorizing Provider  DULoxetine (CYMBALTA) 30 MG capsule Take 30 mg by mouth daily.   Yes Historical Provider, MD  estrogens, conjugated, (PREMARIN) 0.625 MG tablet Take 0.625 mg by mouth daily. Take daily for 21  days then do not take for 7 days.   Yes Historical Provider, MD  FeFum-FePoly-FA-B Cmp-C-Biot (INTEGRA PLUS) CAPS Take 1 capsule by mouth 2 (two) times daily. 04/01/14  Yes Historical Provider, MD  ipratropium (ATROVENT) 0.06 % nasal spray Place 1 spray into both nostrils daily. 04/24/14  Yes Historical Provider, MD  leflunomide (ARAVA) 20 MG tablet Take 20 mg by mouth daily.   Yes Historical Provider, MD  LEVEMIR FLEXTOUCH 100 UNIT/ML Pen Inject 20 Units into the skin every morning.  03/11/14  Yes Historical Provider, MD  lisinopril  (PRINIVIL,ZESTRIL) 10 MG tablet Take 10 mg by mouth daily.   Yes Historical Provider, MD  LORazepam (ATIVAN) 1 MG tablet Take 1 mg by mouth every morning.    Yes Historical Provider, MD  metoprolol succinate (TOPROL-XL) 100 MG 24 hr tablet Take 100 mg by mouth daily. Take with or immediately following a meal.   Yes Historical Provider, MD  NOVOLOG FLEXPEN 100 UNIT/ML FlexPen Inject 9 Units into the skin 3 (three) times daily as needed for high blood sugar.  03/29/14  Yes Historical Provider, MD  predniSONE (DELTASONE) 10 MG tablet Take 10 mg by mouth daily with breakfast.   Yes Historical Provider, MD  pregabalin (LYRICA) 75 MG capsule Take 75 mg by mouth 2 (two) times daily.   Yes Historical Provider, MD  ranitidine (ZANTAC) 150 MG tablet Take 150 mg by mouth 2 (two) times daily.   Yes Historical Provider, MD  risperiDONE (RISPERDAL) 0.5 MG tablet Take 0.5 mg by mouth at bedtime.   Yes Historical Provider, MD  traMADol (ULTRAM) 50 MG tablet Take 50 mg by mouth every 6 (six) hours as needed for moderate pain.    Yes Historical Provider, MD  vitamin C (ASCORBIC ACID) 500 MG tablet Take 500 mg by mouth 2 (two) times daily.   Yes Historical Provider, MD  XOPENEX HFA 45 MCG/ACT inhaler Inhale 1 puff into the lungs daily as needed for wheezing or shortness of breath.  04/20/14  Yes Historical Provider, MD  ciprofloxacin (CIPRO) 250 MG tablet Take 250 mg by mouth 2 (two) times daily. 05/04/14   Historical Provider, MD   Triage Vitals: BP 92/74 mmHg  Pulse 91  Temp(Src) 98.2 F (36.8 C) (Oral)  Resp 18  SpO2 96%  Physical Exam  Constitutional: She appears well-developed and well-nourished. No distress.  HENT:  Head: Normocephalic and atraumatic.  Eyes: Conjunctivae are normal.  Neck: Normal range of motion.  Cardiovascular: Normal rate, regular rhythm and intact distal pulses.   Capillary refill < 3 sec  Pulmonary/Chest: Effort normal and breath sounds normal.  Musculoskeletal: She exhibits edema  and tenderness.  ROM: no ROM in the right shoulder due to pain. Full ROM of the right elbow,wrist, and fingers to her baseline. Patient with RA and multiple, chronic joint deformities noted. RUE is ecchymotic and erythematous with increased warmth extending from the anterior and lateral shoulder to the elbow with tenderness to palpation throughout. 2+ pitting edema of the bilateral lower extremities.   Neurological: She is alert. Coordination normal.  Sensation intact to dull and sharp in the bilateral upper extremities. Strength is 5/5 in the right elbow/wrist and grip strength is 0/5 on the right shoulder.  Skin: Skin is warm and dry. She is not diaphoretic.  No tenting of the skin  Psychiatric: She has a normal mood and affect.  Nursing note and vitals reviewed.   ED Course  Procedures (including critical care time)  DIAGNOSTIC STUDIES: Oxygen  Saturation is 96% on [place] 2L/min, adequate by my interpretation.    COORDINATION OF CARE: At 1949 Discussed treatment plan with patient which includes labs. Patient agrees.   Labs Review Labs Reviewed  CBC WITH DIFFERENTIAL/PLATELET - Abnormal; Notable for the following:    RBC 3.17 (*)    Hemoglobin 9.2 (*)    HCT 28.6 (*)    Neutrophils Relative % 88 (*)    Neutro Abs 8.9 (*)    Lymphocytes Relative 4 (*)    Lymphs Abs 0.4 (*)    All other components within normal limits  BASIC METABOLIC PANEL - Abnormal; Notable for the following:    Sodium 134 (*)    Chloride 95 (*)    Glucose, Bld 141 (*)    Creatinine, Ser 1.19 (*)    Calcium 8.3 (*)    GFR calc non Af Amer 44 (*)    GFR calc Af Amer 51 (*)    All other components within normal limits  SEDIMENTATION RATE - Abnormal; Notable for the following:    Sed Rate 71 (*)    All other components within normal limits  C-REACTIVE PROTEIN  I-STAT CG4 LACTIC ACID, ED  I-STAT CG4 LACTIC ACID, ED    Imaging Review Dg Shoulder Right  05/27/2014   CLINICAL DATA:  Limited motion of the  right arm and shoulder.  EXAM: RIGHT SHOULDER - 2+ VIEW  COMPARISON:  None.  FINDINGS: There is osseous destruction of the proximal right humerus involving the humeral head. Additionally there is suggestion of lysis of the distal clavicle and widening of the [facility] joint. There is extensive swelling involving the surrounding soft tissues.  IMPRESSION: Osteolysis of the humeral head. Extensive surrounding soft tissue swelling. Widening of the [facility] joint and suggestion of lysis of the distal clavicle. In the absence of prior comparisons, findings are nonspecific however this may be secondary to infection or potentially diabetic neuropathy. In the acute setting, consider further evaluation with MRI if infection is of concern.  These results were called by telephone at the time of interpretation on 05/27/2014 at 6:37 pm to Dr. Leafy Kindle , who verbally acknowledged these results.   Electronically Signed   By: Lovey Newcomer M.D.   On: 05/27/2014 18:39     EKG Interpretation None      MDM   Final diagnoses:  Joint pain  RA (rheumatoid arthritis)  IDDM (insulin dependent diabetes mellitus)  Pain  Swelling  Immunosuppressed status   Marisa [name] presents with pain for several weeks. Patient is immunosuppressed taking a DMARD, prednisone 10 mg daily and is an insulin-dependent diabetic.  Abs and with atraumatic pain in the right shoulder and humerus with significant ecchymosis and increased warmth. She has full range of motion of her right hand, right wrist and right elbow but is unable to move her right shoulder. She reports this is unusual for her.   We'll begin with labs, MRI.    12:25 AM  Labs without leukocytosis, white blood cell count 10.2. Mild elevation in serum creatinine 1.19. Elevation in sedimentation rate is 71. C-reactive protein pending. Patient was taken to MRI but became claustrophobic. She has been given Ativan. Will try again in a few minutes.  The patient was discussed with and seen  by Dr. Jeneen Rinks who agrees with the treatment plan.   1:54 AM Care transferred to Dr. Julianne Rice for disposition pending MRI results.  Anticipate admission.    BP 134/69 mmHg  Pulse 68  Temp(Src) 98.5 F (36.9 C) (Oral)  Resp 20  SpO2 100%   Abigail Butts, PA-C 05/28/14 0154  Tanna Furry, MD 05/28/14 2249

## 2014-05-27 NOTE — ED Notes (Signed)
Pt. reports progressing arthritic joint pain at right shoulder onset last week with pain , swelling and bruising , denies injury  or fall , pt. stated she has been " mopping " at home today .

## 2014-05-27 NOTE — ED Notes (Signed)
Pt  Has RA and had a steroid injection to right shoulder last week.  Now right arm red and warm to touch.

## 2014-05-27 NOTE — ED Notes (Signed)
IV attempted x's 2 without success 

## 2014-05-27 NOTE — ED Notes (Signed)
Pt states that she noticed a bruise  On her right arm a week ago and the bruise is progressively getting worse. Pt denies any injury or fall.

## 2014-05-27 NOTE — ED Notes (Signed)
Report given to Ocie Bob, RN

## 2014-05-28 ENCOUNTER — Inpatient Hospital Stay (HOSPITAL_COMMUNITY): Payer: Medicare Other

## 2014-05-28 ENCOUNTER — Emergency Department (HOSPITAL_COMMUNITY): Payer: Medicare Other

## 2014-05-28 ENCOUNTER — Encounter (HOSPITAL_COMMUNITY): Payer: Self-pay | Admitting: *Deleted

## 2014-05-28 DIAGNOSIS — J449 Chronic obstructive pulmonary disease, unspecified: Secondary | ICD-10-CM | POA: Diagnosis not present

## 2014-05-28 DIAGNOSIS — Z880 Allergy status to penicillin: Secondary | ICD-10-CM | POA: Diagnosis not present

## 2014-05-28 DIAGNOSIS — I129 Hypertensive chronic kidney disease with stage 1 through stage 4 chronic kidney disease, or unspecified chronic kidney disease: Secondary | ICD-10-CM | POA: Diagnosis present

## 2014-05-28 DIAGNOSIS — B9689 Other specified bacterial agents as the cause of diseases classified elsewhere: Secondary | ICD-10-CM | POA: Diagnosis not present

## 2014-05-28 DIAGNOSIS — Z794 Long term (current) use of insulin: Secondary | ICD-10-CM | POA: Diagnosis not present

## 2014-05-28 DIAGNOSIS — M25419 Effusion, unspecified shoulder: Secondary | ICD-10-CM | POA: Diagnosis present

## 2014-05-28 DIAGNOSIS — E860 Dehydration: Secondary | ICD-10-CM | POA: Diagnosis present

## 2014-05-28 DIAGNOSIS — E871 Hypo-osmolality and hyponatremia: Secondary | ICD-10-CM | POA: Diagnosis present

## 2014-05-28 DIAGNOSIS — M2540 Effusion, unspecified joint: Secondary | ICD-10-CM | POA: Diagnosis present

## 2014-05-28 DIAGNOSIS — N189 Chronic kidney disease, unspecified: Secondary | ICD-10-CM | POA: Diagnosis present

## 2014-05-28 DIAGNOSIS — M25511 Pain in right shoulder: Secondary | ICD-10-CM | POA: Diagnosis present

## 2014-05-28 DIAGNOSIS — F329 Major depressive disorder, single episode, unspecified: Secondary | ICD-10-CM | POA: Diagnosis present

## 2014-05-28 DIAGNOSIS — E1122 Type 2 diabetes mellitus with diabetic chronic kidney disease: Secondary | ICD-10-CM | POA: Diagnosis not present

## 2014-05-28 DIAGNOSIS — Z9981 Dependence on supplemental oxygen: Secondary | ICD-10-CM | POA: Diagnosis not present

## 2014-05-28 DIAGNOSIS — J45909 Unspecified asthma, uncomplicated: Secondary | ICD-10-CM | POA: Diagnosis present

## 2014-05-28 DIAGNOSIS — J441 Chronic obstructive pulmonary disease with (acute) exacerbation: Secondary | ICD-10-CM | POA: Diagnosis present

## 2014-05-28 DIAGNOSIS — M25411 Effusion, right shoulder: Secondary | ICD-10-CM | POA: Diagnosis not present

## 2014-05-28 DIAGNOSIS — Z7952 Long term (current) use of systemic steroids: Secondary | ICD-10-CM | POA: Diagnosis not present

## 2014-05-28 DIAGNOSIS — M069 Rheumatoid arthritis, unspecified: Secondary | ICD-10-CM | POA: Diagnosis present

## 2014-05-28 DIAGNOSIS — E119 Type 2 diabetes mellitus without complications: Secondary | ICD-10-CM | POA: Diagnosis not present

## 2014-05-28 DIAGNOSIS — IMO0002 Reserved for concepts with insufficient information to code with codable children: Secondary | ICD-10-CM

## 2014-05-28 DIAGNOSIS — E1165 Type 2 diabetes mellitus with hyperglycemia: Secondary | ICD-10-CM | POA: Diagnosis not present

## 2014-05-28 DIAGNOSIS — Z888 Allergy status to other drugs, medicaments and biological substances status: Secondary | ICD-10-CM | POA: Diagnosis not present

## 2014-05-28 DIAGNOSIS — I517 Cardiomegaly: Secondary | ICD-10-CM | POA: Diagnosis not present

## 2014-05-28 DIAGNOSIS — N390 Urinary tract infection, site not specified: Secondary | ICD-10-CM | POA: Diagnosis present

## 2014-05-28 DIAGNOSIS — E11649 Type 2 diabetes mellitus with hypoglycemia without coma: Secondary | ICD-10-CM | POA: Diagnosis present

## 2014-05-28 LAB — GLUCOSE, CAPILLARY
GLUCOSE-CAPILLARY: 146 mg/dL — AB (ref 70–99)
GLUCOSE-CAPILLARY: 298 mg/dL — AB (ref 70–99)
GLUCOSE-CAPILLARY: 27 mg/dL — AB (ref 70–99)
GLUCOSE-CAPILLARY: 81 mg/dL (ref 70–99)
GLUCOSE-CAPILLARY: 53 mg/dL — AB (ref 70–99)
Glucose-Capillary: 68 mg/dL — ABNORMAL LOW (ref 70–99)
Glucose-Capillary: 415 mg/dL — ABNORMAL HIGH (ref 70–99)
Glucose-Capillary: 50 mg/dL — ABNORMAL LOW (ref 70–99)
Glucose-Capillary: 73 mg/dL (ref 70–99)
Glucose-Capillary: 105 mg/dL — ABNORMAL HIGH (ref 70–99)
Glucose-Capillary: 133 mg/dL — ABNORMAL HIGH (ref 70–99)
Glucose-Capillary: 152 mg/dL — ABNORMAL HIGH (ref 70–99)
Glucose-Capillary: 32 mg/dL — CL (ref 70–99)
Glucose-Capillary: 86 mg/dL (ref 70–99)

## 2014-05-28 LAB — GRAM STAIN

## 2014-05-28 LAB — C-REACTIVE PROTEIN: CRP: 4.3 mg/dL — AB (ref <0.60)

## 2014-05-28 LAB — URINALYSIS, ROUTINE W REFLEX MICROSCOPIC
BILIRUBIN URINE: NEGATIVE
GLUCOSE, UA: NEGATIVE mg/dL
Hgb urine dipstick: NEGATIVE
Ketones, ur: NEGATIVE mg/dL
LEUKOCYTES UA: NEGATIVE
Nitrite: NEGATIVE
PH: 7 (ref 5.0–8.0)
Protein, ur: NEGATIVE mg/dL
Specific Gravity, Urine: 1.013 (ref 1.005–1.030)
Urobilinogen, UA: 0.2 mg/dL (ref 0.0–1.0)

## 2014-05-28 LAB — SYNOVIAL CELL COUNT + DIFF, W/ CRYSTALS
Crystals, Fluid: NONE SEEN
Eosinophils-Synovial: 0 % (ref 0–1)
Lymphocytes-Synovial Fld: 0 % (ref 0–20)
Monocyte-Macrophage-Synovial Fluid: 14 % — ABNORMAL LOW (ref 50–90)
Neutrophil, Synovial: 86 % — ABNORMAL HIGH (ref 0–25)
WBC, Synovial: 523 /mm3 — ABNORMAL HIGH (ref 0–200)

## 2014-05-28 MED ORDER — DEXTROSE 50 % IV SOLN
25.0000 mL | Freq: Once | INTRAVENOUS | Status: AC
  Administered 2014-05-28: 25 mL via INTRAVENOUS

## 2014-05-28 MED ORDER — IPRATROPIUM BROMIDE 0.06 % NA SOLN
1.0000 | Freq: Every day | NASAL | Status: DC
  Administered 2014-05-28 – 2014-05-29 (×2): 1 via NASAL
  Filled 2014-05-28: qty 15

## 2014-05-28 MED ORDER — GLUCOSE 40 % PO GEL: 1.0000 | Freq: Once | ORAL | Status: DC

## 2014-05-28 MED ORDER — MORPHINE SULFATE 2 MG/ML IJ SOLN
2.0000 mg | Freq: Once | INTRAMUSCULAR | Status: AC
  Administered 2014-05-28: 2 mg via INTRAVENOUS
  Filled 2014-05-28: qty 1

## 2014-05-28 MED ORDER — DEXTROSE 50 % IV SOLN
INTRAVENOUS | Status: AC
  Administered 2014-05-28: 50 mL
  Filled 2014-05-28: qty 50

## 2014-05-28 MED ORDER — ALUM & MAG HYDROXIDE-SIMETH 200-200-20 MG/5ML PO SUSP: 30.0000 mL | Freq: Four times a day (QID) | ORAL | Status: DC | PRN

## 2014-05-28 MED ORDER — INSULIN DETEMIR 100 UNIT/ML ~~LOC~~ SOLN
18.0000 [IU] | Freq: Every day | SUBCUTANEOUS | Status: DC
  Administered 2014-05-29: 18 [IU] via SUBCUTANEOUS
  Filled 2014-05-28: qty 0.18

## 2014-05-28 MED ORDER — HYDRALAZINE HCL 20 MG/ML IJ SOLN
10.0000 mg | Freq: Four times a day (QID) | INTRAMUSCULAR | Status: DC | PRN
  Filled 2014-05-28 (×2): qty 1

## 2014-05-28 MED ORDER — DOCUSATE SODIUM 100 MG PO CAPS
100.0000 mg | ORAL_CAPSULE | Freq: Two times a day (BID) | ORAL | Status: DC
  Administered 2014-05-28 – 2014-05-30 (×5): 100 mg via ORAL
  Filled 2014-05-28 (×7): qty 1

## 2014-05-28 MED ORDER — MORPHINE SULFATE 2 MG/ML IJ SOLN
1.0000 mg | INTRAMUSCULAR | Status: DC | PRN
  Administered 2014-05-28: 2 mg via INTRAVENOUS
  Filled 2014-05-28 (×2): qty 1

## 2014-05-28 MED ORDER — LORAZEPAM 1 MG PO TABS
1.0000 mg | ORAL_TABLET | Freq: Every morning | ORAL | Status: DC
  Administered 2014-05-28 – 2014-05-30 (×3): 1 mg via ORAL
  Filled 2014-05-28 (×3): qty 1

## 2014-05-28 MED ORDER — FE FUMARATE-B12-VIT C-FA-IFC PO CAPS
1.0000 | ORAL_CAPSULE | Freq: Two times a day (BID) | ORAL | Status: DC
  Administered 2014-05-28 – 2014-05-30 (×5): 1 via ORAL
  Filled 2014-05-28 (×6): qty 1

## 2014-05-28 MED ORDER — CEFTRIAXONE SODIUM IN DEXTROSE 20 MG/ML IV SOLN
1.0000 g | INTRAVENOUS | Status: DC
  Filled 2014-05-28: qty 50

## 2014-05-28 MED ORDER — SODIUM CHLORIDE 0.9 % IV SOLN
INTRAVENOUS | Status: DC
Start: 2014-05-28 — End: 2014-05-28
  Administered 2014-05-28: 10:00:00 via INTRAVENOUS

## 2014-05-28 MED ORDER — LEFLUNOMIDE 20 MG PO TABS
20.0000 mg | ORAL_TABLET | Freq: Every day | ORAL | Status: DC
  Administered 2014-05-28 – 2014-05-30 (×3): 20 mg via ORAL
  Filled 2014-05-28 (×3): qty 1

## 2014-05-28 MED ORDER — INSULIN ASPART 100 UNIT/ML ~~LOC~~ SOLN: 0.0000 [IU] | Freq: Every day | SUBCUTANEOUS | Status: DC

## 2014-05-28 MED ORDER — LISINOPRIL 10 MG PO TABS
10.0000 mg | ORAL_TABLET | Freq: Every day | ORAL | Status: DC
  Administered 2014-05-28 – 2014-05-30 (×3): 10 mg via ORAL
  Filled 2014-05-28 (×3): qty 1

## 2014-05-28 MED ORDER — INSULIN ASPART 100 UNIT/ML ~~LOC~~ SOLN
0.0000 [IU] | Freq: Three times a day (TID) | SUBCUTANEOUS | Status: DC
  Administered 2014-05-28: 5 [IU] via SUBCUTANEOUS

## 2014-05-28 MED ORDER — TRAMADOL HCL 50 MG PO TABS
50.0000 mg | ORAL_TABLET | Freq: Four times a day (QID) | ORAL | Status: DC | PRN
  Administered 2014-05-28 – 2014-05-30 (×4): 50 mg via ORAL
  Filled 2014-05-28 (×5): qty 1

## 2014-05-28 MED ORDER — ACETAMINOPHEN 325 MG PO TABS
650.0000 mg | ORAL_TABLET | Freq: Four times a day (QID) | ORAL | Status: DC | PRN
  Administered 2014-05-28 – 2014-05-29 (×2): 650 mg via ORAL
  Filled 2014-05-28 (×2): qty 2

## 2014-05-28 MED ORDER — ONDANSETRON HCL 4 MG PO TABS: 4.0000 mg | ORAL_TABLET | Freq: Four times a day (QID) | ORAL | Status: DC | PRN

## 2014-05-28 MED ORDER — DEXTROSE 10 % IV SOLN
INTRAVENOUS | Status: AC
  Administered 2014-05-28: 23:00:00 via INTRAVENOUS

## 2014-05-28 MED ORDER — METOPROLOL SUCCINATE ER 100 MG PO TB24
100.0000 mg | ORAL_TABLET | Freq: Every day | ORAL | Status: DC
  Administered 2014-05-28 – 2014-05-30 (×3): 100 mg via ORAL
  Filled 2014-05-28 (×3): qty 1

## 2014-05-28 MED ORDER — FAMOTIDINE 20 MG PO TABS
20.0000 mg | ORAL_TABLET | Freq: Every day | ORAL | Status: DC
  Administered 2014-05-28 – 2014-05-30 (×3): 20 mg via ORAL
  Filled 2014-05-28 (×3): qty 1

## 2014-05-28 MED ORDER — ACETAMINOPHEN 650 MG RE SUPP: 650.0000 mg | Freq: Four times a day (QID) | RECTAL | Status: DC | PRN

## 2014-05-28 MED ORDER — ONDANSETRON HCL 4 MG/2ML IJ SOLN
4.0000 mg | Freq: Four times a day (QID) | INTRAMUSCULAR | Status: DC | PRN
  Administered 2014-05-28: 4 mg via INTRAVENOUS
  Filled 2014-05-28: qty 2

## 2014-05-28 MED ORDER — VANCOMYCIN HCL IN DEXTROSE 1-5 GM/200ML-% IV SOLN
1000.0000 mg | Freq: Once | INTRAVENOUS | Status: AC
Start: 2014-05-28 — End: 2014-05-28
  Administered 2014-05-28: 1000 mg via INTRAVENOUS
  Filled 2014-05-28: qty 200

## 2014-05-28 MED ORDER — INSULIN ASPART 100 UNIT/ML ~~LOC~~ SOLN
4.0000 [IU] | Freq: Three times a day (TID) | SUBCUTANEOUS | Status: DC
  Administered 2014-05-28: 4 [IU] via SUBCUTANEOUS

## 2014-05-28 MED ORDER — DULOXETINE HCL 30 MG PO CPEP
30.0000 mg | ORAL_CAPSULE | Freq: Every day | ORAL | Status: DC
  Administered 2014-05-28 – 2014-05-30 (×3): 30 mg via ORAL
  Filled 2014-05-28 (×3): qty 1

## 2014-05-28 MED ORDER — VANCOMYCIN HCL 500 MG IV SOLR
500.0000 mg | INTRAVENOUS | Status: DC
  Administered 2014-05-29: 500 mg via INTRAVENOUS
  Filled 2014-05-28 (×2): qty 500

## 2014-05-28 MED ORDER — ENSURE COMPLETE PO LIQD
237.0000 mL | Freq: Two times a day (BID) | ORAL | Status: DC
  Administered 2014-05-28 – 2014-05-30 (×4): 237 mL via ORAL

## 2014-05-28 MED ORDER — DEXTROSE 50 % IV SOLN
INTRAVENOUS | Status: AC
  Administered 2014-05-28: 25 mL
  Filled 2014-05-28: qty 50

## 2014-05-28 MED ORDER — CEFTRIAXONE SODIUM IN DEXTROSE 20 MG/ML IV SOLN
1.0000 g | INTRAVENOUS | Status: DC
  Administered 2014-05-28 – 2014-05-30 (×3): 1 g via INTRAVENOUS
  Filled 2014-05-28 (×3): qty 50

## 2014-05-28 MED ORDER — LEVALBUTEROL HCL 0.63 MG/3ML IN NEBU: 0.6300 mg | INHALATION_SOLUTION | Freq: Every day | RESPIRATORY_TRACT | Status: DC | PRN

## 2014-05-28 MED ORDER — ESTROGENS CONJUGATED 0.625 MG PO TABS
0.6250 mg | ORAL_TABLET | Freq: Every day | ORAL | Status: DC
  Administered 2014-05-28 – 2014-05-30 (×3): 0.625 mg via ORAL
  Filled 2014-05-28 (×3): qty 1

## 2014-05-28 MED ORDER — GLUCOSE 40 % PO GEL
ORAL | Status: AC
  Administered 2014-05-28: 37.5 g
  Filled 2014-05-28: qty 1

## 2014-05-28 MED ORDER — PREDNISONE 10 MG PO TABS
10.0000 mg | ORAL_TABLET | Freq: Every day | ORAL | Status: DC
  Administered 2014-05-29 – 2014-05-30 (×2): 10 mg via ORAL
  Filled 2014-05-28 (×4): qty 1

## 2014-05-28 MED ORDER — GADOBENATE DIMEGLUMINE 529 MG/ML IV SOLN
10.0000 mL | Freq: Once | INTRAVENOUS | Status: AC | PRN
  Administered 2014-05-28: 10 mL via INTRAVENOUS

## 2014-05-28 MED ORDER — PREGABALIN 75 MG PO CAPS
75.0000 mg | ORAL_CAPSULE | Freq: Two times a day (BID) | ORAL | Status: DC
  Administered 2014-05-28 – 2014-05-30 (×5): 75 mg via ORAL
  Filled 2014-05-28 (×5): qty 1

## 2014-05-28 MED ORDER — DIPHENHYDRAMINE HCL 25 MG PO CAPS
25.0000 mg | ORAL_CAPSULE | Freq: Four times a day (QID) | ORAL | Status: DC | PRN
  Filled 2014-05-28: qty 1

## 2014-05-28 MED ORDER — INSULIN DETEMIR 100 UNIT/ML ~~LOC~~ SOLN
15.0000 [IU] | Freq: Every day | SUBCUTANEOUS | Status: DC
  Administered 2014-05-28: 15 [IU] via SUBCUTANEOUS
  Filled 2014-05-28: qty 0.15

## 2014-05-28 MED ORDER — INSULIN ASPART 100 UNIT/ML ~~LOC~~ SOLN
0.0000 [IU] | Freq: Three times a day (TID) | SUBCUTANEOUS | Status: DC
  Administered 2014-05-28: 20 [IU] via SUBCUTANEOUS
  Administered 2014-05-29: 4 [IU] via SUBCUTANEOUS
  Administered 2014-05-29 – 2014-05-30 (×4): 7 [IU] via SUBCUTANEOUS

## 2014-05-28 MED ORDER — VITAMIN C 500 MG PO TABS
500.0000 mg | ORAL_TABLET | Freq: Two times a day (BID) | ORAL | Status: DC
  Administered 2014-05-28 – 2014-05-30 (×5): 500 mg via ORAL
  Filled 2014-05-28 (×6): qty 1

## 2014-05-28 MED ORDER — RISPERIDONE 0.5 MG PO TABS
0.5000 mg | ORAL_TABLET | Freq: Every day | ORAL | Status: DC
  Administered 2014-05-28 – 2014-05-30 (×2): 0.5 mg via ORAL
  Filled 2014-05-28 (×3): qty 1

## 2014-05-28 NOTE — Progress Notes (Signed)
ANTIBIOTIC CONSULT NOTE - INITIAL  Pharmacy Consult for vancomycin  Indication: wound infection  Allergies  Allergen Reactions  . Enbrel [Etanercept] Swelling    Arm swelling   . Humira [Adalimumab] Swelling    Arm swelling   . Demerol [Meperidine] Itching and Rash  . Penicillins Itching and Rash   Patient Measurements: Height: 4\' 2"  (127 cm) Weight: 113 lb 1.5 oz (51.3 kg) IBW/kg (Calculated) : 22.5 Vital Signs: Temp: 97.7 F (36.5 C) (02/13 1157) Temp Source: Oral (02/13 1157) BP: 163/70 mmHg (02/13 1157) Pulse Rate: 97 (02/13 1157) Intake/Output from previous day:   Intake/Output from this shift: Total I/O In: 840 [P.O.:840] Out: 450 [Urine:450]  Labs:  Recent Labs  05/27/14 2018  WBC 10.2  HGB 9.2*  PLT 250  CREATININE 1.19*   Estimated Creatinine Clearance: 22.6 mL/min (by C-G formula based on Cr of 1.19). No results for input(s): VANCOTROUGH, VANCOPEAK, VANCORANDOM, GENTTROUGH, GENTPEAK, GENTRANDOM, TOBRATROUGH, TOBRAPEAK, TOBRARND, AMIKACINPEAK, AMIKACINTROU, AMIKACIN in the last 72 hours.   Microbiology: Recent Results (from the past 720 hour(s))  Gram stain     Status: None   Collection Time: 05/28/14  4:00 AM  Result Value Ref Range Status   Specimen Description FLUID SHOULDER RIGHT  Final   Special Requests NONE  Final   Gram Stain   Final    ABUNDANT WBC PRESENT,BOTH PMN AND MONONUCLEAR NO ORGANISMS SEEN    Report Status 05/28/2014 FINAL  Final    Medical History: Past Medical History  Diagnosis Date  . Asthma   . Hypertension   . Diabetes mellitus without complication   . Arthritis   . Rheumatoid arteritis     Medications:  Anti-infectives    Start     Dose/Rate Route Frequency Ordered Stop   05/28/14 0930  cefTRIAXone (ROCEPHIN) 1 g in dextrose 5 % 50 mL IVPB - Premix    Comments:  Please do not start rocephin until Urine is collected.  THANKS   1 g 100 mL/hr over 30 Minutes Intravenous Every 24 hours 05/28/14 0846     05/28/14 0900  cefTRIAXone (ROCEPHIN) 1 g in dextrose 5 % 50 mL IVPB - Premix  Status:  Discontinued    Comments:  Please do not start rocephin until Urine and blood cultures are collected.  THANKS   1 g 100 mL/hr over 30 Minutes Intravenous Every 24 hours 05/28/14 2683 05/28/14 0846     Assessment: 74 year old female on Rocephin for UTI and status post right shoulder effusion aspiration to add vancomycin with new fevers for rule out septic joint. Afebrile. WBC is within normal limits. LA 0.55. ESR 71, CRP 4.3. SCr 1.19/estimated CrCl ~22.6 mL/min. Blood, urine, joint fluid cultures sent.   Goal of Therapy:  Vancomycin trough level 15-20 mcg/ml  Plan:  Vancomycin 1g IV x1, then 500mg  IV every 24 hours.  Monitor renal function, clinical status, and culture results.    Sloan Leiter, PharmD, BCPS Clinical Pharmacist [phone] 05/28/2014,3:21 PM

## 2014-05-28 NOTE — ED Notes (Signed)
Patient transported to MRI 

## 2014-05-28 NOTE — Progress Notes (Signed)
Shoulder fluid collection aspirated right 90 cc blood obtained Cell count gs crystals cultures pending

## 2014-05-28 NOTE — Consult Note (Signed)
Reason for Consult: Right shoulder pain Referring Physician: Dr. Paulino Marisa [name] is an 74 y.o. female.  HPI: [place] 74 year old female with several day history H medical onset right shoulder pain denies any fevers and chills. She does report swelling and bruising. Has developed some pain as well in the right shoulder region. No prior history of surgery or trauma to the shoulder. Patient does have rheumatoid arthritis and diabetes. She's not currently on any antibiotics. She reports pain and weakness of the right shoulder but it's not particularly any more severe than it has been in the past.  Past Medical History  Diagnosis Date  . Asthma   . Hypertension   . Diabetes mellitus without complication   . Arthritis   . Rheumatoid arteritis     History reviewed. No pertinent past surgical history.  No family history on file.  Social History:  reports that she has never smoked. She does not have any smokeless tobacco history on file. She reports that she does not drink alcohol. Her drug history is not on file.  Allergies:  Allergies  Allergen Reactions  . Enbrel [Etanercept] Swelling    Arm swelling   . Humira [Adalimumab] Swelling    Arm swelling   . Demerol [Meperidine] Itching and Rash  . Penicillins Itching and Rash    Medications: I have reviewed the patient's current medications.  Results for orders placed or performed during the hospital encounter of 05/27/14 (from the past 48 hour(s))  CBC with Differential/Platelet     Status: Abnormal   Collection Time: 05/27/14  8:18 PM  Result Value Ref Range   WBC 10.2 4.0 - 10.5 K/uL   RBC 3.17 (L) 3.87 - 5.11 MIL/uL   Hemoglobin 9.2 (L) 12.0 - 15.0 g/dL   HCT 28.6 (L) 36.0 - 46.0 %   MCV 90.2 78.0 - 100.0 fL   MCH 29.0 26.0 - 34.0 pg   MCHC 32.2 30.0 - 36.0 g/dL   RDW 13.6 11.5 - 15.5 %   Platelets 250 150 - 400 K/uL   Neutrophils Relative % 88 (H) 43 - 77 %   Neutro Abs 8.9 (H) 1.7 - 7.7 K/uL   Lymphocytes  Relative 4 (L) 12 - 46 %   Lymphs Abs 0.4 (L) 0.7 - 4.0 K/uL   Monocytes Relative 8 3 - 12 %   Monocytes Absolute 0.9 0.1 - 1.0 K/uL   Eosinophils Relative 0 0 - 5 %   Eosinophils Absolute 0.0 0.0 - 0.7 K/uL   Basophils Relative 0 0 - 1 %   Basophils Absolute 0.0 0.0 - 0.1 K/uL  Basic metabolic panel     Status: Abnormal   Collection Time: 05/27/14  8:18 PM  Result Value Ref Range   Sodium 134 (L) 135 - 145 mmol/L   Potassium 4.6 3.5 - 5.1 mmol/L   Chloride 95 (L) 96 - 112 mmol/L   CO2 32 19 - 32 mmol/L   Glucose, Bld 141 (H) 70 - 99 mg/dL   BUN 15 6 - 23 mg/dL   Creatinine, Ser 1.19 (H) 0.50 - 1.10 mg/dL   Calcium 8.3 (L) 8.4 - 10.5 mg/dL   GFR calc non Af Amer 44 (L) >90 mL/min   GFR calc Af Amer 51 (L) >90 mL/min    Comment: (NOTE) The eGFR has been calculated using the CKD EPI equation. This calculation has not been validated in all clinical situations. eGFR's persistently <90 mL/min signify possible Chronic Kidney Disease.  Anion gap 7 5 - 15  Sedimentation rate     Status: Abnormal   Collection Time: 05/27/14  8:18 PM  Result Value Ref Range   Sed Rate 71 (H) 0 - 22 mm/hr  C-reactive protein     Status: Abnormal   Collection Time: 05/27/14  8:18 PM  Result Value Ref Range   CRP 4.3 (H) <0.60 mg/dL    Comment: Performed at [place] Lactic Acid, ED     Status: None   Collection Time: 05/27/14  8:32 PM  Result Value Ref Range   Lactic Acid, Venous 0.64 0.5 - 2.0 mmol/L  I-Stat CG4 Lactic Acid, ED     Status: None   Collection Time: 05/27/14 11:40 PM  Result Value Ref Range   Lactic Acid, Venous 0.55 0.5 - 2.0 mmol/L    Dg Shoulder Right  05/27/2014   CLINICAL DATA:  Limited motion of the right arm and shoulder.  EXAM: RIGHT SHOULDER - 2+ VIEW  COMPARISON:  None.  FINDINGS: There is osseous destruction of the proximal right humerus involving the humeral head. Additionally there is suggestion of lysis of the distal clavicle and widening of  the [facility] joint. There is extensive swelling involving the surrounding soft tissues.  IMPRESSION: Osteolysis of the humeral head. Extensive surrounding soft tissue swelling. Widening of the [facility] joint and suggestion of lysis of the distal clavicle. In the absence of prior comparisons, findings are nonspecific however this may be secondary to infection or potentially diabetic neuropathy. In the acute setting, consider further evaluation with MRI if infection is of concern.  These results were called by telephone at the time of interpretation on 05/27/2014 at 6:37 pm to Dr. Leafy Kindle , who verbally acknowledged these results.   Electronically Signed   By: Lovey Newcomer M.D.   On: 05/27/2014 18:39    Review of Systems  Constitutional: Negative.   HENT: Negative.   Eyes: Negative.   Respiratory: Negative.   Cardiovascular: Negative.   Gastrointestinal: Negative.   Genitourinary: Negative.   Musculoskeletal: Positive for joint pain.  Skin: Negative.   Neurological: Negative.   Endo/Heme/Allergies: Negative.   Psychiatric/Behavioral: Negative.    Blood pressure 114/76, pulse 77, temperature 99.2 F (37.3 C), temperature source Oral, resp. rate 16, SpO2 100 %. Physical Exam  Constitutional: She appears well-developed.  HENT:  Head: Normocephalic.  Eyes: Pupils are equal, round, and reactive to light.  Neck: Normal range of motion.  Cardiovascular: Normal rate.   Respiratory: Effort normal.  Musculoskeletal: Normal range of motion.  Neurological: She is alert.  Skin: Skin is warm.  Psychiatric: She has a normal mood and affect.   right shoulder is examined and shows ecchymosis and bruising. She has fairly decent range of motion of his does have a significant effusion in the shoulder joint is no proximal lymphadenopathy neck range of motion is intact motor sensory function in the right hand is intact the ecchymosis and bruising extends halfway down the humerus. Elbow range of motion is  normal  Assessment/Plan: Impression is right proximal humerus and shoulder joint effusion. Sedimentation rate elevated white counts normal. Currently her temperature is increased here in the emergency room from 98-99.2. She's not really having any fevers and chills and doesn't really report any feelings of malaise. Plan at this time is for aspiration of this fluid and examination of the fluid for Gram stain cell count therapy and anaerobic culture. Will see what that shows. This could be  rheumatoid flare versus occult trauma versus infection. We'll see what the aspirate shows and plan from there. Risk and benefits of shoulder aspiration discussed with the patient all questions answered  DEAN,GREGORY SCOTT 05/28/2014, 3:39 AM

## 2014-05-28 NOTE — H&P (Signed)
[facility] History and Physical                                                                                    Marisa [name], is a 74 y.o. female  MRN: [id number]   DOB - 04/08/1941  Admit Date - 05/27/2014  Outpatient Primary MD for the patient is Tommy Medal, MD  With History of -  Past Medical History  Diagnosis Date  . Asthma   . Hypertension   . Diabetes mellitus without complication   . Arthritis   . Rheumatoid arteritis       History reviewed. No pertinent past surgical history.  in for   Chief Complaint  Patient presents with  . Arm Pain     HPI  Husband helps to give an clarify the history. Patient is somewhat of a poor historian.  Catia Todorov  is a 74 y.o. female, with rheumatoid arthritis, diabetes, and asthma (on home O2) who presents with right shoulder pain. The patient is immunocompromised on chronic prednisone. She reports a couple of days ago she woke in the morning with a swollen purple shoulder and upper arm. She has no idea how this happened. It is very painful. Further she reports that 2 weeks ago she had a urinary tract infection and was treated with 5 days of Cipro (she has an allergy to penicillin). Unfortunately she is still having burning pain and frequency of urination. She also complains of pain in her left pelvic area. She has not had any vomiting but does complain of nausea. Finally the patient mentions increased shortness of breath that seemed to develop at about the same time as the shoulder effusion.  Her normal rheumatoid pain is well controlled. She states it is typically in her elbows and knees.  In the ER she was seen by Dr. Marlou Sa of orthopedics who aspirated her right shoulder and obtained 90 mL of blood.  Gram stain of the fluid shows abundant W BCs with no organisms. Cultures are still pending. Labs indicate anemia (Hgb 9.2), mildly low sodium, chloride, and a mildly elevated creatinine (1.19). There are no baseline labs in Epic.     Review of Systems   In addition to the HPI above,  No Fever-chills, No Headache, No changes with Vision or hearing, No problems swallowing food or Liquids, Vomiting, Bowel movements are regular, No Blood in stool or Urine, No dysuria, No new skin rashes or bruises,  A full 10 point Review of Systems was done, except as stated above, all other Review of Systems were negative.  Social History History  Substance Use Topics  . Smoking status: Never Smoker   . Smokeless tobacco: Not on file  . Alcohol Use: No   she lives at home with her husband and is able to feed and dress herself.  Family History History reviewed. No pertinent family history.  there is a history of rheumatoid arthritis in the family. Both of her parents died of heart attacks.  Prior to Admission medications   Medication Sig Start Date End Date Taking? Authorizing Provider  DULoxetine (CYMBALTA) 30 MG capsule Take 30 mg by mouth daily.   Yes Historical  Provider, MD  estrogens, conjugated, (PREMARIN) 0.625 MG tablet Take 0.625 mg by mouth daily. Take daily for 21 days then do not take for 7 days.   Yes Historical Provider, MD  FeFum-FePoly-FA-B Cmp-C-Biot (INTEGRA PLUS) CAPS Take 1 capsule by mouth 2 (two) times daily. 04/01/14  Yes Historical Provider, MD  ipratropium (ATROVENT) 0.06 % nasal spray Place 1 spray into both nostrils daily. 04/24/14  Yes Historical Provider, MD  leflunomide (ARAVA) 20 MG tablet Take 20 mg by mouth daily.   Yes Historical Provider, MD  LEVEMIR FLEXTOUCH 100 UNIT/ML Pen Inject 20 Units into the skin every morning.  03/11/14  Yes Historical Provider, MD  lisinopril (PRINIVIL,ZESTRIL) 10 MG tablet Take 10 mg by mouth daily.   Yes Historical Provider, MD  LORazepam (ATIVAN) 1 MG tablet Take 1 mg by mouth every morning.    Yes Historical Provider, MD  metoprolol succinate (TOPROL-XL) 100 MG 24 hr tablet Take 100 mg by mouth daily. Take with or immediately following a meal.   Yes Historical  Provider, MD  NOVOLOG FLEXPEN 100 UNIT/ML FlexPen Inject 9 Units into the skin 3 (three) times daily as needed for high blood sugar.  03/29/14  Yes Historical Provider, MD  predniSONE (DELTASONE) 10 MG tablet Take 10 mg by mouth daily with breakfast.   Yes Historical Provider, MD  pregabalin (LYRICA) 75 MG capsule Take 75 mg by mouth 2 (two) times daily.   Yes Historical Provider, MD  ranitidine (ZANTAC) 150 MG tablet Take 150 mg by mouth 2 (two) times daily.   Yes Historical Provider, MD  risperiDONE (RISPERDAL) 0.5 MG tablet Take 0.5 mg by mouth at bedtime.   Yes Historical Provider, MD  traMADol (ULTRAM) 50 MG tablet Take 50 mg by mouth every 6 (six) hours as needed for moderate pain.    Yes Historical Provider, MD  vitamin C (ASCORBIC ACID) 500 MG tablet Take 500 mg by mouth 2 (two) times daily.   Yes Historical Provider, MD  XOPENEX HFA 45 MCG/ACT inhaler Inhale 1 puff into the lungs daily as needed for wheezing or shortness of breath.  04/20/14  Yes Historical Provider, MD  ciprofloxacin (CIPRO) 250 MG tablet Take 250 mg by mouth 2 (two) times daily. 05/04/14   Historical Provider, MD    Allergies  Allergen Reactions  . Enbrel [Etanercept] Swelling    Arm swelling   . Humira [Adalimumab] Swelling    Arm swelling   . Demerol [Meperidine] Itching and Rash  . Penicillins Itching and Rash    Physical Exam  Vitals  Blood pressure 148/57, pulse 83, temperature 99.2 F (37.3 C), temperature source Oral, resp. rate 18, SpO2 100 %.   General: Pleasant, frail, chronically ill-appearing female lying in bed in NAD, husband at bedside.  Psych:  Mildly confused, cooperative, Not Suicidal or Homicidal, Awake Alert, Oriented X 3.  Neuro:   No F.N deficits, ALL C.Nerves Intact, Sensation intact all 4 extremities.  ENT:  Ears and Eyes appear Normal, Conjunctivae clear, PER.   Neck:  Supple, No lymphadenopathy appreciated  Respiratory:  Symmetrical chest wall movement, Good air movement  bilaterally, CTAB.  Cardiac:  Irregular with 2/6 systolic murmur, no LE edema noted, no JVD.    Abdomen:  Positive bowel sounds, Soft, mildly diffusely tender, Non distended,  No masses appreciated  Skin:  No Cyanosis, Normal Skin Turgor, No Skin Rash  Extremities:  Able to move all 4. Right upper extremity appears significantly bruised over the length of the humerus and  swollen particularly at the proximal humerus.  Data Review  CBC  Recent Labs Lab 05/27/14 2018  WBC 10.2  HGB 9.2*  HCT 28.6*  PLT 250  MCV 90.2  MCH 29.0  MCHC 32.2  RDW 13.6  LYMPHSABS 0.4*  MONOABS 0.9  EOSABS 0.0  BASOSABS 0.0    Chemistries   Recent Labs Lab 05/27/14 2018  NA 134*  K 4.6  CL 95*  CO2 32  GLUCOSE 141*  BUN 15  CREATININE 1.19*  CALCIUM 8.3*    Imaging results:   Dg Shoulder Right  05/27/2014   CLINICAL DATA:  Limited motion of the right arm and shoulder.  EXAM: RIGHT SHOULDER - 2+ VIEW  COMPARISON:  None.  FINDINGS: There is osseous destruction of the proximal right humerus involving the humeral head. Additionally there is suggestion of lysis of the distal clavicle and widening of the [facility] joint. There is extensive swelling involving the surrounding soft tissues.  IMPRESSION: Osteolysis of the humeral head. Extensive surrounding soft tissue swelling. Widening of the [facility] joint and suggestion of lysis of the distal clavicle. In the absence of prior comparisons, findings are nonspecific however this may be secondary to infection or potentially diabetic neuropathy. In the acute setting, consider further evaluation with MRI if infection is of concern.  These results were called by telephone at the time of interpretation on 05/27/2014 at 6:37 pm to Dr. Leafy Kindle , who verbally acknowledged these results.   Electronically Signed   By: Lovey Newcomer M.D.   On: 05/27/2014 18:39   Mr Shoulder Right W Wo Contrast  05/28/2014   CLINICAL DATA:  Joint pain. Rheumatoid arthritis. Diabetes.  RIGHT shoulder pain. Abnormal shoulder radiograph with osteolysis.  EXAM: MRI OF THE RIGHT SHOULDER WITHOUT AND WITH CONTRAST  TECHNIQUE: Multiplanar, multisequence MR imaging was performed both before and after administration of intravenous contrast.  CONTRAST:  64mL MULTIHANCE GADOBENATE DIMEGLUMINE 529 MG/ML IV SOLN  COMPARISON:  Radiographs 05/27/2014.  FINDINGS: Severe destructive changes of the RIGHT shoulder are present. There is a large effusion with ragged synovial enhancement. Septated fluid is present dorsal to the RIGHT shoulder, dissecting through the INFRASPINATUS muscle. 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  The study is moderately degraded by motion artifact.  There is chronic erosion of the humeral head and glenoid. The rotator cuff tendons, if intact are markedly diminutive with diffuse attrition/ attenuation. The rotator cuff tendons appear nonfunctional based on the loss of substance in the distal tendons. There is diffuse myositis in the deltoid, which may be reactive or infectious.  IMPRESSION: Diffuse destructive changes of the RIGHT shoulder. Differential considerations are septic arthritis in the setting of recent instrumentation however there is overlap with severe rheumatoid arthritis. Joint aspiration should be considered to evaluate septic arthritis.   Electronically Signed   By: Dereck Ligas M.D.   On: 05/28/2014 08:14    Assessment & Plan  Active Problems:   Joint effusion   Shoulder effusion   Rheumatoid arthritis   UTI (urinary tract infection)   Diabetes mellitus type 2, insulin dependent   COPD exacerbation  Right shoulder effusion/hematoma Appreciate orthopedic consultation and aspiration. We will follow their recommendations. Question whether or not this was a spontaneous hematoma. Follow body fluid cultures. Patient is not on pharmacologic DVT prophylaxis.  Dehydration Likely due to poor by mouth intake. Evidenced by low sodium, low  chloride, likely mildly elevated creatinine. Will gently hydrate with IV fluids  Rheumatoid arthritis Per the  patient is well controlled. She sees Dr. Amil Amen. We will continue her leflunomide, Lyrica, prednisone for now. May need to re assess if the patient has an infected joint.  Recent urinary tract infection Treated with Cipro 5 days. Patient still symptomatic. Will culture her urine. Patient states her penicillin allergy is not severe so I will order Rocephin.  COPD/asthma No wheezing. Given her complaint of increased shortness of breath I will check a chest x-ray. Continue Xopenex inhaler.  Continue oxygen.   DVT Prophylaxis: SCDs  AM Labs Ordered, also please review Full Orders  Family Communication:   Husband at bedside  Code Status:  Full code  Condition:  Guarded, but appears stable  Time spent in minutes : [address],  PA-C on 05/28/2014 at 8:30 AM  Between 7am to 7pm - Pager - [phone]  After 7pm go to [email or web address] - password [email or web address]  And look for the night coverage person covering me after hours  [facility]

## 2014-05-28 NOTE — Evaluation (Signed)
Physical Therapy Evaluation Patient Details Name: Marisa [name] MRN: [id number] DOB: 22-Apr-1940 Today's Date: 05/28/2014   History of Present Illness  74 y.o. female admitted with right shoulder effusion. Hx of rhematoid arthritis and COPD/asthma.  Clinical Impression  Pt admitted with the above complications. Pt currently with functional limitations due to the deficits listed below (see PT Problem List). Requires min guard to supervision for mobility assessed today. Ambulates up to 60 feet with a rolling walker for support. Feel she will continue to progress her functional independence as she progresses medically and will likely be able to return home with care from her husband and HHPT services. Pt will benefit from skilled PT to increase their independence and safety with mobility to allow discharge to the venue listed below.       Follow Up Recommendations Home health PT    Equipment Recommendations  None recommended by PT    Recommendations for Other Services OT consult     Precautions / Restrictions Precautions Precautions: None Restrictions Weight Bearing Restrictions: No      Mobility  Bed Mobility Overal bed mobility: Needs Assistance Bed Mobility: Supine to Sit;Sit to Supine     Supine to sit: Supervision Sit to supine: Supervision   General bed mobility comments: Supervision for safety. Requires exra time. Did not need physical assist.  Transfers Overall transfer level: Needs assistance Equipment used: Rolling walker (2 wheeled) Transfers: Sit to/from Stand Sit to Stand: Supervision         General transfer comment: Supervision for safety. VC for hand placement. No physical assist needed.  Ambulation/Gait Ambulation/Gait assistance: Min guard Ambulation Distance (Feet): 60 Feet Assistive device: Rolling walker (2 wheeled) Gait Pattern/deviations: Step-through pattern;Decreased stride length;Trunk flexed;Drifts right/left Gait velocity: decreased    General Gait Details: Educated on safe DME use with a rolling walker. Intermittent cues for walker placement for proximity. No loss of balance noted. Required cues to prevent drifting towards Right.  Stairs            Wheelchair Mobility    Modified Rankin (Stroke Patients Only)       Balance Overall balance assessment: Needs assistance Sitting-balance support: No upper extremity supported;Feet supported Sitting balance-Leahy Scale: Good     Standing balance support: No upper extremity supported Standing balance-Leahy Scale: Fair                               Pertinent Vitals/Pain Pain Assessment: 0-10 Pain Score: 5  Pain Location: right shoulder primarily, also pain in bil UEs Pain Descriptors / Indicators: Burning Pain Intervention(s): Limited activity within patient's tolerance;Monitored during session;Repositioned    Home Living Family/patient expects to be discharged to:: Private residence Living Arrangements: Spouse/significant other Available Help at Discharge: Family;Available 24 hours/day Type of Home: House Home Access: Stairs to enter Entrance Stairs-Rails: Psychiatric nurse of Steps: 6 Home Layout: One level Home Equipment: Cane - single point;Walker - 4 wheels;Shower seat;Transport chair      Prior Function Level of Independence: Needs assistance   Gait / Transfers Assistance Needed: cane use inside of house  ADL's / Homemaking Assistance Needed: needs assist to bath. could dress herself PTA        Hand Dominance   Dominant Hand: Right    Extremity/Trunk Assessment   Upper Extremity Assessment: Defer to OT evaluation           Lower Extremity Assessment: Generalized weakness  Communication   Communication: No difficulties  Cognition Arousal/Alertness: Lethargic Behavior During Therapy: WFL for tasks assessed/performed Overall Cognitive Status: Within Functional Limits for tasks assessed                       General Comments General comments (skin integrity, edema, etc.): SpO2 97% on 3L supplemental O2 during ambulatory bout.. HR 105-117 bpm at rest and ambulating respectively. IV fluid leaking from instertion site.    Exercises General Exercises - Lower Extremity Ankle Circles/Pumps: AROM;Both;10 reps;Supine Heel Slides: Strengthening;Both;5 reps;Supine      Assessment/Plan    PT Assessment Patient needs continued PT services  PT Diagnosis Difficulty walking;Generalized weakness;Acute pain   PT Problem List Decreased strength;Decreased range of motion;Decreased activity tolerance;Decreased balance;Decreased mobility;Decreased knowledge of use of DME;Pain  PT Treatment Interventions DME instruction;Gait training;Stair training;Functional mobility training;Therapeutic activities;Therapeutic exercise;Balance training;Neuromuscular re-education;Patient/family education;Modalities   PT Goals (Current goals can be found in the Care Plan section) Acute Rehab PT Goals Patient Stated Goal: Go home when my shoulder is better PT Goal Formulation: With patient Time For Goal Achievement: 06/11/14 Potential to Achieve Goals: Good    Frequency Min 3X/week   Barriers to discharge        Co-evaluation               End of Session Equipment Utilized During Treatment: Gait belt;Oxygen Activity Tolerance: Patient tolerated treatment well Patient left: in bed;with call bell/phone within reach;with family/visitor present Nurse Communication: Mobility status;Other (comment) (IV fluid coming from insertion site)         Time: 0768-0881 PT Time Calculation (min) (ACUTE ONLY): 26 min   Charges:   PT Evaluation $Initial PT Evaluation Tier I: 1 Procedure PT Treatments $Therapeutic Activity: 8-22 mins   PT G Codes:        Ellouise Newer 05/28/2014, 6:10 PM Camille Bal Paj[place], [place]

## 2014-05-28 NOTE — Progress Notes (Signed)
INITIAL NUTRITION ASSESSMENT  DOCUMENTATION CODES Per approved criteria  -Not Applicable   INTERVENTION: Encourage PO intake  Ensure Complete po BID, each supplement provides 350 kcal and 13 grams of protein  NUTRITION DIAGNOSIS: Inadequate oral intake related to pain in arms and loss of appetite as evidenced by reportedly having no intake the past two days.   Goal: Pt to meet >/= 90% of their estimated nutrition needs   Monitor:  Oral intake, GI distress, Labs/procedures  Reason for Assessment: MST of 2  74 y.o. female  Admitting Dx: <principal problem not specified>  ASSESSMENT: Jearldine Cassady is a 74 y.o. female, with rheumatoid arthritis, diabetes, and asthma (on home O2) who presents with right shoulder pain.   Spoke to patient who stated she has been having severe pain in her arms for the past two weeks. However, the last two days have been the worst and she has been in such pain that she has not had any appetite.  She was open to having ensure. Declined snacks at this time.   Nutrition Focused Physical Exam: None all areas.  Height: Ht Readings from Last 1 Encounters:  05/28/14 4\' 2"  (1.27 m)    Weight: Wt Readings from Last 1 Encounters:  05/28/14 113 lb 1.5 oz (51.3 kg)    Ideal Body Weight: 83 lbs  % Ideal Body Weight: 136%  Wt Readings from Last 10 Encounters:  05/28/14 113 lb 1.5 oz (51.3 kg)   Usual Body Weight: 120  % Usual Body Weight: 94%  BMI:  Body mass index is 31.81 kg/(m^2).  Estimated Nutritional Needs: Kcal: >1550 (15 kcal/kg) Protein: 56.1 Fluid: >1.5 liters  Skin: dry, bruising  Diet Order: Diet Carb Modified  EDUCATION NEEDS: -No education needs identified at this time   Intake/Output Summary (Last 24 hours) at 05/28/14 1503 Last data filed at 05/28/14 1343  Gross per 24 hour  Intake    840 ml  Output    450 ml  Net    390 ml    Last BM: 2/12   Labs:   Recent Labs Lab 05/27/14 2018  NA 134*  K 4.6  CL  95*  CO2 32  BUN 15  CREATININE 1.19*  CALCIUM 8.3*  GLUCOSE 141*    CBG (last 3)   Recent Labs  05/28/14 0810 05/28/14 0911 05/28/14 1159  GLUCAP 68* 146* 298*    Scheduled Meds: . cefTRIAXone (ROCEPHIN)  IV  1 g Intravenous Q24H  . docusate sodium  100 mg Oral BID  . DULoxetine  30 mg Oral Daily  . estrogens (conjugated)  0.625 mg Oral Daily  . famotidine  20 mg Oral Daily  . feeding supplement (ENSURE COMPLETE)  237 mL Oral BID BM  . ferrous [id number] C-folic acid  1 capsule Oral BID  . insulin aspart  0-20 Units Subcutaneous TID WC  . insulin aspart  0-5 Units Subcutaneous QHS  . [START ON 05/29/2014] insulin detemir  18 Units Subcutaneous Daily  . ipratropium  1 spray Each Nare Daily  . leflunomide  20 mg Oral Daily  . lisinopril  10 mg Oral Daily  . LORazepam  1 mg Oral q morning - 10a  . metoprolol succinate  100 mg Oral Daily  . predniSONE  10 mg Oral Q breakfast  . pregabalin  75 mg Oral BID  . risperiDONE  0.5 mg Oral QHS  . vitamin C  500 mg Oral BID    Continuous Infusions: . sodium chloride 50 mL/hr  at 05/28/14 0946    Past Medical History  Diagnosis Date  . Asthma   . Hypertension   . Diabetes mellitus without complication   . Arthritis   . Rheumatoid arteritis     History reviewed. No pertinent past surgical history.  Burtis Junes RD, LDN Nutrition Pager: [phone]  05/28/2014 3:03 PM

## 2014-05-28 NOTE — ED Provider Notes (Signed)
Patient discussed with [facility] PA.  Patient examined history reviewed. Symptoms for several days. Exam shows erythema and ecchymosis but no warmth. Limited range of motion due to pain. X-ray shows destruction of the humeral head. Differential includes long-standing RA, septic joint, avascular necrosis due to steroid use. MRI requested. She will likely need need admission for aspiration.   labs pending.  Tanna Furry, MD 05/28/14 2249

## 2014-05-28 NOTE — Progress Notes (Addendum)
Hypoglycemic Event  CBG: 27  Treatment: oral glucose 15g  Symptoms: sweating, lethargy  Follow-up CBG: Time:2040 CBG Result:73  Possible Reasons for Event: high dose of insulin prev received  Comments/MD notified: yes CBG followed up 15 minutes after the recheck and revealed a CBG of 50, 25cc 50% dextrose given, next check at 2125 revealed a CBG of 81.  Recheck at 2152 revealed 32, another 25cc of 50% dextrose given.  15 minute recheck resulted in a CBG of 86. CBG rechecked 15 minutes after this result and it was 53.  Per MD instruction 1 amp of D50% was then given and D10 IV fluids started.  15 minute recheck resulted in CBG of 152.  CBG was 133 at next 15 minute recheck. Next 15 minute recheck CBG was 105.  Recheck at 0000 was 91. 15 minutes recheck 88. Snack given because patient is more lucid. CBG @01 :45 was 127. CBGs continue to trend in 140-160 range currently.  Will continue to monitor patient.  Marisa Debar, NP made aware.   Marisa [name], Marisa [name]  Remember to initiate Hypoglycemia Order Set & complete

## 2014-05-29 DIAGNOSIS — E1122 Type 2 diabetes mellitus with diabetic chronic kidney disease: Secondary | ICD-10-CM

## 2014-05-29 DIAGNOSIS — D899 Disorder involving the immune mechanism, unspecified: Secondary | ICD-10-CM

## 2014-05-29 DIAGNOSIS — B9689 Other specified bacterial agents as the cause of diseases classified elsewhere: Secondary | ICD-10-CM

## 2014-05-29 DIAGNOSIS — M2550 Pain in unspecified joint: Secondary | ICD-10-CM | POA: Insufficient documentation

## 2014-05-29 DIAGNOSIS — J449 Chronic obstructive pulmonary disease, unspecified: Secondary | ICD-10-CM

## 2014-05-29 DIAGNOSIS — N189 Chronic kidney disease, unspecified: Secondary | ICD-10-CM

## 2014-05-29 DIAGNOSIS — E1165 Type 2 diabetes mellitus with hyperglycemia: Secondary | ICD-10-CM

## 2014-05-29 DIAGNOSIS — M069 Rheumatoid arthritis, unspecified: Secondary | ICD-10-CM

## 2014-05-29 DIAGNOSIS — D849 Immunodeficiency, unspecified: Secondary | ICD-10-CM | POA: Insufficient documentation

## 2014-05-29 DIAGNOSIS — I129 Hypertensive chronic kidney disease with stage 1 through stage 4 chronic kidney disease, or unspecified chronic kidney disease: Secondary | ICD-10-CM

## 2014-05-29 DIAGNOSIS — E871 Hypo-osmolality and hyponatremia: Secondary | ICD-10-CM

## 2014-05-29 DIAGNOSIS — F329 Major depressive disorder, single episode, unspecified: Secondary | ICD-10-CM

## 2014-05-29 DIAGNOSIS — Z794 Long term (current) use of insulin: Secondary | ICD-10-CM

## 2014-05-29 DIAGNOSIS — N390 Urinary tract infection, site not specified: Secondary | ICD-10-CM

## 2014-05-29 DIAGNOSIS — M25411 Effusion, right shoulder: Principal | ICD-10-CM

## 2014-05-29 LAB — GLUCOSE, CAPILLARY
GLUCOSE-CAPILLARY: 101 mg/dL — AB (ref 70–99)
GLUCOSE-CAPILLARY: 187 mg/dL — AB (ref 70–99)
GLUCOSE-CAPILLARY: 213 mg/dL — AB (ref 70–99)
GLUCOSE-CAPILLARY: 88 mg/dL (ref 70–99)
GLUCOSE-CAPILLARY: 91 mg/dL (ref 70–99)
Glucose-Capillary: 127 mg/dL — ABNORMAL HIGH (ref 70–99)
Glucose-Capillary: 160 mg/dL — ABNORMAL HIGH (ref 70–99)
Glucose-Capillary: 141 mg/dL — ABNORMAL HIGH (ref 70–99)
Glucose-Capillary: 229 mg/dL — ABNORMAL HIGH (ref 70–99)
Glucose-Capillary: 80 mg/dL (ref 70–99)
Glucose-Capillary: 118 mg/dL — ABNORMAL HIGH (ref 70–99)

## 2014-05-29 LAB — CBC
HCT: 27.2 % — ABNORMAL LOW (ref 36.0–46.0)
Hemoglobin: 8.8 g/dL — ABNORMAL LOW (ref 12.0–15.0)
MCH: 29.9 pg (ref 26.0–34.0)
MCHC: 32.4 g/dL (ref 30.0–36.0)
MCV: 92.5 fL (ref 78.0–100.0)
PLATELETS: 239 10*3/uL (ref 150–400)
RBC: 2.94 MIL/uL — AB (ref 3.87–5.11)
RDW: 13.8 % (ref 11.5–15.5)
WBC: 10.2 10*3/uL (ref 4.0–10.5)

## 2014-05-29 LAB — BASIC METABOLIC PANEL
Anion gap: 3 — ABNORMAL LOW (ref 5–15)
BUN: 14 mg/dL (ref 6–23)
CALCIUM: 8.6 mg/dL (ref 8.4–10.5)
CO2: 35 mmol/L — ABNORMAL HIGH (ref 19–32)
Chloride: 95 mmol/L — ABNORMAL LOW (ref 96–112)
Creatinine, Ser: 1.19 mg/dL — ABNORMAL HIGH (ref 0.50–1.10)
GFR calc Af Amer: 51 mL/min — ABNORMAL LOW (ref >90)
GFR, EST NON AFRICAN AMERICAN: 44 mL/min — AB (ref >90)
Glucose, Bld: 101 mg/dL — ABNORMAL HIGH (ref 70–99)
Potassium: 4.4 mmol/L (ref 3.5–5.1)
Sodium: 133 mmol/L — ABNORMAL LOW (ref 135–145)

## 2014-05-29 LAB — URINE CULTURE: Colony Count: 50000

## 2014-05-29 MED ORDER — SODIUM CHLORIDE 0.9 % IV SOLN
INTRAVENOUS | Status: AC
  Administered 2014-05-29: 18:00:00 via INTRAVENOUS

## 2014-05-29 NOTE — Progress Notes (Signed)
Right shoulder joint aspirated 2/13 am cxs neg to date Doesn't look infected Needs ot/pt for shoulder rehab Could likely go home am after pt eval

## 2014-05-29 NOTE — Progress Notes (Signed)
[facility] PROGRESS NOTE  Marisa [name] OIT:[id number] DOB: Jan 22, 1941 DOA: 05/27/2014 PCP: Tommy Medal, MD   Brief narrative:   74yo woman with PMH of RA, DM, asthma on home O2 presented with right shoulder pain.  She is on chronic prednisone for RA.  She reports the pain started about 2 days ago with swelling and color change.  She had no injury.  She had limited mobility.  She was treated with Abx 2 weeks ago for a urine infection and is still having urinary symptoms including burning pain.  She typically has symptoms in her elbows and knees from her RA.  An aspiration o fher right shoulder was done in the ED showed abundant WBCs and no organisms.  She had no fever, chills.  Orthopedics is following  Assessment/Plan:    Shoulder effusion: Etiology includes acute infection, spontaneous hematoma, acute athritis related to RA - Joint aspiration showed no organisms, no crystals, lots of WBC, GS with no organisms, cultures are pending - She was started on Abx for possible infection with Rocephin and vancomycin - Will continue antibiotics until culture returns negative, given her immunocompromised state being on steroids, she could have an occult infection.   - PT/OT - per ortho note, patient could be d/c once PT sees her.  - WBC 10.2  - Tramadol for pain control, can provide something stronger if needed overnight    Rheumatoid arthritis - Well controlled per patient - Sees Dr. Amil Amen - Continue leflunomide, lyrica, prednisone - Consider weaning prednisone if infected    UTI (urinary tract infection) - Treated at home with cipro - Repeat UA clear - Continue antibiotics as above, should help complete course for UTI  Hyponatremia, mild - Unknown baseline - Monitor closely, encourage PO intake - BMET in the AM  CKD - Unclear baseline, monitor closely while inpatient - Cr 1.19, stable, BUN normal - Possible dehydration    Diabetes mellitus type 2, insulin dependent - CBG  ranged 120s to low 200s - Had hypoglycemia last night, placed on D10 - Discontinue long acting insulin, monitor on SSI only.  - Add long acting insulin back as tolerated.  - Encourage PO intake - Continue lisinopril    COPD  - Complaining of SOB, CXR revealed only cardiomegaly and COPD - Continue inhalers and home O2  Depression - Continue duloxetine, ativan  HTN - continue metoprolol and lisinopril - BP moderately uncontrolled.  If persists, consider up titrating lisinopril tomorrow - Hydral prn for SBP > 180.   Code Status: Full.  Family Communication:  plan of care discussed with the patient and husband Disposition Plan: Home when stable.   IV access:  Peripheral IV  Procedures and diagnostic studies:    Dg Chest 2 View  05/28/2014   CLINICAL DATA:  Right-sided chest pain. Dyspnea. Rheumatoid arthritis.  EXAM: CHEST  2 VIEW  COMPARISON:  None.  FINDINGS: Mild to moderate cardiomegaly is noted as well as ectasia of the thoracic aorta. Pulmonary hyperinflation is consistent with COPD. No evidence pulmonary infiltrate or pleural effusion. No mass or lymphadenopathy identified. Old right-sided rib fracture deformities are noted. Severe erosive arthropathy of both shoulder joints consistent with known history of rheumatoid arthritis.  IMPRESSION: Cardiomegaly and COPD.  No active lung disease.   Electronically Signed   By: Earle Gell M.D.   On: 05/28/2014 12:40   Dg Shoulder Right  05/27/2014   CLINICAL DATA:  Limited motion of the right arm and shoulder.  EXAM: RIGHT SHOULDER -  2+ VIEW  COMPARISON:  None.  FINDINGS: There is osseous destruction of the proximal right humerus involving the humeral head. Additionally there is suggestion of lysis of the distal clavicle and widening of the [facility] joint. There is extensive swelling involving the surrounding soft tissues.  IMPRESSION: Osteolysis of the humeral head. Extensive surrounding soft tissue swelling. Widening of the [facility] joint and  suggestion of lysis of the distal clavicle. In the absence of prior comparisons, findings are nonspecific however this may be secondary to infection or potentially diabetic neuropathy. In the acute setting, consider further evaluation with MRI if infection is of concern.  These results were called by telephone at the time of interpretation on 05/27/2014 at 6:37 pm to Dr. Leafy Kindle , who verbally acknowledged these results.   Electronically Signed   By: Lovey Newcomer M.D.   On: 05/27/2014 18:39   Mr Shoulder Right W Wo Contrast  05/28/2014   CLINICAL DATA:  Joint pain. Rheumatoid arthritis. Diabetes. RIGHT shoulder pain. Abnormal shoulder radiograph with osteolysis.  EXAM: MRI OF THE RIGHT SHOULDER WITHOUT AND WITH CONTRAST  TECHNIQUE: Multiplanar, multisequence MR imaging was performed both before and after administration of intravenous contrast.  CONTRAST:  78mL MULTIHANCE GADOBENATE DIMEGLUMINE 529 MG/ML IV SOLN  COMPARISON:  Radiographs 05/27/2014.  FINDINGS: Severe destructive changes of the RIGHT shoulder are present. There is a large effusion with ragged synovial enhancement. Septated fluid is present dorsal to the RIGHT shoulder, dissecting through the INFRASPINATUS muscle. 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  The study is moderately degraded by motion artifact.  There is chronic erosion of the humeral head and glenoid. The rotator cuff tendons, if intact are markedly diminutive with diffuse attrition/ attenuation. The rotator cuff tendons appear nonfunctional based on the loss of substance in the distal tendons. There is diffuse myositis in the deltoid, which may be reactive or infectious.  IMPRESSION: Diffuse destructive changes of the RIGHT shoulder. Differential considerations are septic arthritis in the setting of recent instrumentation however there is overlap with severe rheumatoid arthritis. Joint aspiration should be considered to evaluate septic arthritis.    Electronically Signed   By: Dereck Ligas M.D.   On: 05/28/2014 08:14    Medical Consultants:  Orthopedics  Other Consultants:    Anti-Infectives:   Rocephin Vancomycin  Gilles Chiquito, MD  [place] Pager [phone]  If 7PM-7AM, please contact night-coverage [email or web address] Password [email or web address] 05/29/2014, 10:59 AM   LOS: 1 day   HPI/Subjective: + shoulder pain and swelling, decreased motion of the left shoulder.  Otherwise, no complaints.    Objective: Filed Vitals:   05/28/14 1157 05/28/14 2158 05/28/14 2227 05/29/14 0605  BP: 163/70 198/67 140/48 136/70  Pulse: 97 94  73  Temp: 97.7 F (36.5 C) 98.4 F (36.9 C)  98.5 F (36.9 C)  TempSrc: Oral Oral  Oral  Resp: 21 18  18   Height:      Weight:      SpO2: 98% 100%  100%    Intake/Output Summary (Last 24 hours) at 05/29/14 1059 Last data filed at 05/29/14 0940  Gross per 24 hour  Intake 2180.83 ml  Output   1875 ml  Net 305.83 ml    Exam:   General:  Pt is alert, follows commands appropriately, not in acute distress  Cardiovascular: Regular rate and normal rhythm, S1/S2, no murmurs  Respiratory: Clear to auscultation bilaterally, no wheezing, no crackles  Abdomen: Soft, non tender, non distended, bowel sounds present  Extremities: No  edema, Rt shoulder swelling with pain and warmth to palpation.  Though joint is warm, her left shoulder feels approximately the same temperature.   Neuro: Grossly nonfocal, unable to move right arm above 30 degrees  Data Reviewed: Basic Metabolic Panel:  Recent Labs Lab 05/27/14 2018 05/29/14 0011  NA 134* 133*  K 4.6 4.4  CL 95* 95*  CO2 32 35*  GLUCOSE 141* 101*  BUN 15 14  CREATININE 1.19* 1.19*  CALCIUM 8.3* 8.6   CBC:  Recent Labs Lab 05/27/14 2018 05/29/14 0011  WBC 10.2 10.2  NEUTROABS 8.9*  --   HGB 9.2* 8.8*  HCT 28.6* 27.2*  MCV 90.2 92.5  PLT 250 239   CBG:  Recent Labs Lab 05/29/14 0102 05/29/14 0144 05/29/14 0251 05/29/14 0406  05/29/14 0756  GLUCAP 101* 127* 160* 141* 187*    Recent Results (from the past 240 hour(s))  Gram stain     Status: None   Collection Time: 05/28/14  4:00 AM  Result Value Ref Range Status   Specimen Description FLUID SHOULDER RIGHT  Final   Special Requests NONE  Final   Gram Stain   Final    ABUNDANT WBC PRESENT,BOTH PMN AND MONONUCLEAR NO ORGANISMS SEEN    Report Status 05/28/2014 FINAL  Final     Scheduled Meds: . cefTRIAXone (ROCEPHIN)  IV  1 g Intravenous Q24H  . dextrose  1 Tube Oral Once  . docusate sodium  100 mg Oral BID  . DULoxetine  30 mg Oral Daily  . estrogens (conjugated)  0.625 mg Oral Daily  . famotidine  20 mg Oral Daily  . feeding supplement (ENSURE COMPLETE)  237 mL Oral BID BM  . ferrous [id number] C-folic acid  1 capsule Oral BID  . insulin aspart  0-20 Units Subcutaneous TID WC  . insulin aspart  0-5 Units Subcutaneous QHS  . insulin detemir  18 Units Subcutaneous Daily  . ipratropium  1 spray Each Nare Daily  . leflunomide  20 mg Oral Daily  . lisinopril  10 mg Oral Daily  . LORazepam  1 mg Oral q morning - 10a  . metoprolol succinate  100 mg Oral Daily  . predniSONE  10 mg Oral Q breakfast  . pregabalin  75 mg Oral BID  . risperiDONE  0.5 mg Oral QHS  . vancomycin  500 mg Intravenous Q24H  . vitamin C  500 mg Oral BID   Continuous Infusions:

## 2014-05-30 LAB — HEMOGLOBIN A1C
HEMOGLOBIN A1C: 6.6 % — AB (ref 4.8–5.6)
Mean Plasma Glucose: 143 mg/dL

## 2014-05-30 LAB — BASIC METABOLIC PANEL
Anion gap: 9 (ref 5–15)
BUN: 13 mg/dL (ref 6–23)
CALCIUM: 8.1 mg/dL — AB (ref 8.4–10.5)
CHLORIDE: 94 mmol/L — AB (ref 96–112)
CO2: 33 mmol/L — ABNORMAL HIGH (ref 19–32)
Creatinine, Ser: 1.2 mg/dL — ABNORMAL HIGH (ref 0.50–1.10)
GFR calc Af Amer: 51 mL/min — ABNORMAL LOW (ref >90)
GFR calc non Af Amer: 44 mL/min — ABNORMAL LOW (ref >90)
Glucose, Bld: 192 mg/dL — ABNORMAL HIGH (ref 70–99)
Potassium: 4.5 mmol/L (ref 3.5–5.1)
Sodium: 136 mmol/L (ref 135–145)

## 2014-05-30 LAB — CBC
HCT: 25.1 % — ABNORMAL LOW (ref 36.0–46.0)
Hemoglobin: 8 g/dL — ABNORMAL LOW (ref 12.0–15.0)
MCH: 28.8 pg (ref 26.0–34.0)
MCHC: 31.9 g/dL (ref 30.0–36.0)
MCV: 90.3 fL (ref 78.0–100.0)
Platelets: 259 10*3/uL (ref 150–400)
RBC: 2.78 MIL/uL — ABNORMAL LOW (ref 3.87–5.11)
RDW: 13.4 % (ref 11.5–15.5)
WBC: 6.8 10*3/uL (ref 4.0–10.5)

## 2014-05-30 LAB — GLUCOSE, CAPILLARY
GLUCOSE-CAPILLARY: 203 mg/dL — AB (ref 70–99)
Glucose-Capillary: 413 mg/dL — ABNORMAL HIGH (ref 70–99)
Glucose-Capillary: 216 mg/dL — ABNORMAL HIGH (ref 70–99)
Glucose-Capillary: 232 mg/dL — ABNORMAL HIGH (ref 70–99)

## 2014-05-30 MED ORDER — ENSURE COMPLETE PO LIQD: 237.0000 mL | Freq: Two times a day (BID) | ORAL | Status: DC

## 2014-05-30 MED ORDER — OXYCODONE HCL 5 MG PO TABS: 5.0000 mg | ORAL_TABLET | ORAL | Status: DC | PRN

## 2014-05-30 NOTE — Progress Notes (Signed)
Medicare Important Message given?  YES (If response is "NO", the following Medicare IM given date fields will be blank) Date Medicare IM given:  05/30/14 Medicare IM given by:  Reynalda Canny 

## 2014-05-30 NOTE — Progress Notes (Signed)
Physical Therapy Treatment Patient Details Name: Marisa [name] MRN: [id number] DOB: 1941-03-04 Today's Date: 05/30/2014    History of Present Illness 74 y.o. female admitted with right shoulder effusion. Hx of rhematoid arthritis and COPD/asthma.    PT Comments    Progressing steadily.  Gait generally steady with cane in a homelike environment.  SpO2 stayed in the upper 90% range with heart rate up to 130 bpm at times.  Follow Up Recommendations  Outpatient PT     Equipment Recommendations  None recommended by PT    Recommendations for Other Services       Precautions / Restrictions Precautions Precautions: None Restrictions Weight Bearing Restrictions: No    Mobility  Bed Mobility Overal bed mobility: Needs Assistance Bed Mobility: Supine to Sit;Sit to Supine     Supine to sit: Supervision Sit to supine: Supervision   General bed mobility comments: extra time only  Transfers Overall transfer level: Needs assistance Equipment used: Straight cane Transfers: Sit to/from Stand Sit to Stand: Supervision         General transfer comment: safe use of cane  Ambulation/Gait Ambulation/Gait assistance: Min guard Ambulation Distance (Feet): 80 Feet (x2) Assistive device: Straight cane Gait Pattern/deviations: Step-through pattern Gait velocity: decreased   General Gait Details: used std cane safely even in the hand not normally used   [employer]:  (patient deferred stairs stating she didn't want to do them.)          Wheelchair Mobility    Modified Rankin (Stroke Patients Only)       Balance Overall balance assessment: Needs assistance Sitting-balance support: No upper extremity supported Sitting balance-Leahy Scale: Good     Standing balance support: No upper extremity supported Standing balance-Leahy Scale: Fair                      Cognition Arousal/Alertness: Lethargic Behavior During Therapy: WFL for tasks  assessed/performed Overall Cognitive Status: Within Functional Limits for tasks assessed                      Exercises General Exercises - Lower Extremity Ankle Circles/Pumps: 20 reps;AROM;Both;Supine Quad Sets: AROM;Strengthening;Both;10 reps;Standing Gluteal Sets: AROM;Strengthening;Both;10 reps;Supine Heel Slides: AROM;Strengthening;Both;10 reps;Supine (resisted) Straight Leg Raises: AROM;Strengthening;Both;10 reps;Supine    General Comments General comments (skin integrity, edema, etc.): spO2 98% on 3L during and after ambulation.  EHR 130 bpm, pt stated she had been axious about having to do stairs.  quickly returned to 90 bpm      Pertinent Vitals/Pain Pain Assessment: Faces Faces Pain Scale: Hurts even more Pain Location: shoulder Pain Descriptors / Indicators: Burning Pain Intervention(s): Limited activity within patient's tolerance;Repositioned    Home Living                      Prior Function            PT Goals (current goals can now be found in the care plan section) Acute Rehab PT Goals Patient Stated Goal: Go home when my shoulder is better PT Goal Formulation: With patient Time For Goal Achievement: 06/11/14 Potential to Achieve Goals: Good Progress towards PT goals: Progressing toward goals    Frequency  Min 3X/week    PT Plan Current plan remains appropriate    Co-evaluation             End of Session     Patient left: in bed;with call bell/phone within reach;with family/visitor present  Time: 6283-1517 PT Time Calculation (min) (ACUTE ONLY): 21 min  Charges:  $Gait Training: 8-22 mins                    G Codes:      Zygmunt Mcglinn, Tessie Fass 05/30/2014, 12:01 PM 05/30/2014  Donnella Sham, PT [phone]  (pager)

## 2014-05-30 NOTE — Discharge Summary (Signed)
Discharge Summary  Marisa [name] IWP:[id number] DOB: 06/19/1940  PCP: Tommy Medal, MD  Admit date: 05/27/2014 Discharge date: 05/30/2014  Time spent: 25 minutes  Recommendations for Outpatient Follow-up:  1. New medication: OxyIR 5 mg every 4 hours as needed for pain 2. Home health PT   Discharge Diagnoses:  Active Hospital Problems   Diagnosis Date Noted  . Immunosuppressed status   . Joint pain   . RA (rheumatoid arthritis)   . Joint effusion 05/28/2014  . Shoulder effusion 05/28/2014  . Rheumatoid arthritis 05/28/2014  . UTI (urinary tract infection) 05/28/2014  . Diabetes mellitus type 2, insulin dependent 05/28/2014  . COPD exacerbation 05/28/2014    Resolved Hospital Problems   Diagnosis Date Noted Date Resolved  No resolved problems to display.    Discharge Condition: Improved, being discharged home  Diet recommendation: Carb modified  Filed Weights   05/28/14 0800  Weight: 51.3 kg (113 lb 1.5 oz)    History of present illness:  74 year old female past history of rheumatoid arthritis diabetes mellitus and asthma on chronic 2 L nasal cannula admitted on 2/12 for right shoulder pain. Patient had started 2 days prior with swelling and color change although patient had no injury. Shoulder x-ray followed by MRI noted erosion of left humeral head concerning for septic joint although rheumatoid arthritis was in the differential diagnosis. Orthopedics performed aspiration of fluid. Patient was placed on antibiotics and admitted to hospitalist service.   Hospital Course:  Active Problems:    Shoulder effusion: Follow-up fluids noted no evidence of acute infection. Given patient's history of rheumatoid arthritis is felt to be the cause of this joint effusion. Antibiotics discontinued. Cultures have grown no growth to date. Follow-up recommendation for home health physical therapy   Rheumatoid arthritis: Patient continued on rheumatoid arthritis   UTI (urinary tract  infection): On Cipro prior to hospitalization, completed course prior to discharge   Diabetes mellitus type 2, insulin dependent: CBG stable   COPD exacerbation: Breathing stable    RA (rheumatoid arthritis): Overall stable. Continue prednisone   Procedures:  Joint aspiration done on admission by orthopedics  Consultations:  Orthopedic surgery  Discharge Exam: BP 156/65 mmHg  Pulse 74  Temp(Src) 98.4 F (36.9 C) (Oral)  Resp 18  Ht 4\' 2"  (1.27 m)  Wt 51.3 kg (113 lb 1.5 oz)  BMI 31.81 kg/m2  SpO2 100%  General: Alert and oriented 3, no acute distress Cardiovascular: Regular rate and rhythm, S1-S2 Respiratory: Clear to auscultation bilaterally  Discharge Instructions You were cared for by a hospitalist during your hospital stay. If you have any questions about your discharge medications or the care you received while you were in the hospital after you are discharged, you can call the unit and asked to speak with the hospitalist on call if the hospitalist that took care of you is not available. Once you are discharged, your primary care physician will handle any further medical issues. Please note that NO REFILLS for any discharge medications will be authorized once you are discharged, as it is imperative that you return to your primary care physician (or establish a relationship with a primary care physician if you do not have one) for your aftercare needs so that they can reassess your need for medications and monitor your lab values.  Discharge Instructions    Diet - low sodium heart healthy    Complete by:  As directed      Increase activity slowly    Complete by:  As  directed             Medication List    STOP taking these medications        ciprofloxacin 250 MG tablet  Commonly known as:  CIPRO      TAKE these medications        DULoxetine 30 MG capsule  Commonly known as:  CYMBALTA  Take 30 mg by mouth daily.     estrogens (conjugated) 0.625 MG tablet    Commonly known as:  PREMARIN  Take 0.625 mg by mouth daily. Take daily for 21 days then do not take for 7 days.     feeding supplement (ENSURE COMPLETE) Liqd  Take 237 mLs by mouth 2 (two) times daily between meals.     INTEGRA PLUS Caps  Take 1 capsule by mouth 2 (two) times daily.  Notes to Patient:  Take one dose tonight     ipratropium 0.06 % nasal spray  Commonly known as:  ATROVENT  Place 1 spray into both nostrils daily.     leflunomide 20 MG tablet  Commonly known as:  ARAVA  Take 20 mg by mouth daily.     LEVEMIR FLEXTOUCH 100 UNIT/ML Pen  Generic drug:  Insulin Detemir  Inject 20 Units into the skin every morning.  Notes to Patient:  Take one dose in the evening     lisinopril 10 MG tablet  Commonly known as:  PRINIVIL,ZESTRIL  Take 10 mg by mouth daily.     LORazepam 1 MG tablet  Commonly known as:  ATIVAN  Take 1 mg by mouth every morning.     metoprolol succinate 100 MG 24 hr tablet  Commonly known as:  TOPROL-XL  Take 100 mg by mouth daily. Take with or immediately following a meal.     NOVOLOG FLEXPEN 100 UNIT/ML FlexPen  Generic drug:  insulin aspart  Inject 9 Units into the skin 3 (three) times daily as needed for high blood sugar.  Notes to Patient:  Take one dose tonight     oxyCODONE 5 MG immediate release tablet  Commonly known as:  Oxy IR/ROXICODONE  Take 1 tablet (5 mg total) by mouth every 4 (four) hours as needed for severe pain.     predniSONE 10 MG tablet  Commonly known as:  DELTASONE  Take 10 mg by mouth daily with breakfast.     pregabalin 75 MG capsule  Commonly known as:  LYRICA  Take 75 mg by mouth 2 (two) times daily.  Notes to Patient:  Take one dose tonight      ranitidine 150 MG tablet  Commonly known as:  ZANTAC  Take 150 mg by mouth 2 (two) times daily.  Notes to Patient:  Take one dose tonight     risperiDONE 0.5 MG tablet  Commonly known as:  RISPERDAL  Take 0.5 mg by mouth at bedtime.     traMADol 50 MG tablet   Commonly known as:  ULTRAM  Take 50 mg by mouth every 6 (six) hours as needed for moderate pain.     vitamin C 500 MG tablet  Commonly known as:  ASCORBIC ACID  Take 500 mg by mouth 2 (two) times daily.  Notes to Patient:  Take one dose tonight     XOPENEX HFA 45 MCG/ACT inhaler  Generic drug:  levalbuterol  Inhale 1 puff into the lungs daily as needed for wheezing or shortness of breath.       Allergies  Allergen Reactions  .  Enbrel [Etanercept] Swelling    Arm swelling   . Humira [Adalimumab] Swelling    Arm swelling   . Demerol [Meperidine] Itching and Rash  . Penicillins Itching and Rash      The results of significant diagnostics from this hospitalization (including imaging, microbiology, ancillary and laboratory) are listed below for reference.    Significant Diagnostic Studies: Dg Chest 2 View  05/28/2014   CLINICAL DATA:  Right-sided chest pain. Dyspnea. Rheumatoid arthritis.  EXAM: CHEST  2 VIEW  COMPARISON:  None.  FINDINGS: Mild to moderate cardiomegaly is noted as well as ectasia of the thoracic aorta. Pulmonary hyperinflation is consistent with COPD. No evidence pulmonary infiltrate or pleural effusion. No mass or lymphadenopathy identified. Old right-sided rib fracture deformities are noted. Severe erosive arthropathy of both shoulder joints consistent with known history of rheumatoid arthritis.  IMPRESSION: Cardiomegaly and COPD.  No active lung disease.   Electronically Signed   By: Earle Gell M.D.   On: 05/28/2014 12:40   Dg Shoulder Right  05/27/2014   CLINICAL DATA:  Limited motion of the right arm and shoulder.  EXAM: RIGHT SHOULDER - 2+ VIEW  COMPARISON:  None.  FINDINGS: There is osseous destruction of the proximal right humerus involving the humeral head. Additionally there is suggestion of lysis of the distal clavicle and widening of the [facility] joint. There is extensive swelling involving the surrounding soft tissues.  IMPRESSION: Osteolysis of the humeral  head. Extensive surrounding soft tissue swelling. Widening of the [facility] joint and suggestion of lysis of the distal clavicle. In the absence of prior comparisons, findings are nonspecific however this may be secondary to infection or potentially diabetic neuropathy. In the acute setting, consider further evaluation with MRI if infection is of concern.  These results were called by telephone at the time of interpretation on 05/27/2014 at 6:37 pm to Dr. Leafy Kindle , who verbally acknowledged these results.   Electronically Signed   By: Lovey Newcomer M.D.   On: 05/27/2014 18:39   Mr Shoulder Right W Wo Contrast  05/28/2014   CLINICAL DATA:  Joint pain. Rheumatoid arthritis. Diabetes. RIGHT shoulder pain. Abnormal shoulder radiograph with osteolysis.  EXAM: MRI OF THE RIGHT SHOULDER WITHOUT AND WITH CONTRAST  TECHNIQUE: Multiplanar, multisequence MR imaging was performed both before and after administration of intravenous contrast.  CONTRAST:  4mL MULTIHANCE GADOBENATE DIMEGLUMINE 529 MG/ML IV SOLN  COMPARISON:  Radiographs 05/27/2014.  FINDINGS: Severe destructive changes of the RIGHT shoulder are present. There is a large effusion with ragged synovial enhancement. Septated fluid is present dorsal to the RIGHT shoulder, dissecting through the INFRASPINATUS muscle. 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  The study is moderately degraded by motion artifact.  There is chronic erosion of the humeral head and glenoid. The rotator cuff tendons, if intact are markedly diminutive with diffuse attrition/ attenuation. The rotator cuff tendons appear nonfunctional based on the loss of substance in the distal tendons. There is diffuse myositis in the deltoid, which may be reactive or infectious.  IMPRESSION: Diffuse destructive changes of the RIGHT shoulder. Differential considerations are septic arthritis in the setting of recent instrumentation however there is overlap with severe rheumatoid arthritis.  Joint aspiration should be considered to evaluate septic arthritis.   Electronically Signed   By: Dereck Ligas M.D.   On: 05/28/2014 08:14    Microbiology: Recent Results (from the past 240 hour(s))  Gram stain     Status: None   Collection Time: 05/28/14  4:00 AM  Result Value Ref Range Status   Specimen Description FLUID SHOULDER RIGHT  Final   Special Requests NONE  Final   Gram Stain   Final    ABUNDANT WBC PRESENT,BOTH PMN AND MONONUCLEAR NO ORGANISMS SEEN    Report Status 05/28/2014 FINAL  Final  Anaerobic culture     Status: None (Preliminary result)   Collection Time: 05/28/14  4:00 AM  Result Value Ref Range Status   Specimen Description FLUID RIGHT SHOULDER  Final   Special Requests NONE  Final   Gram Stain PENDING  Incomplete   Culture   Final    NO ANAEROBES ISOLATED; CULTURE IN PROGRESS FOR 5 DAYS Performed at [facility]    Report Status PENDING  Incomplete  Body fluid culture     Status: None (Preliminary result)   Collection Time: 05/28/14  4:00 AM  Result Value Ref Range Status   Specimen Description FLUID RIGHT SHOULDER  Final   Special Requests NONE  Final   Gram Stain   Final    ABUNDANT WBC PRESENT,BOTH PMN AND MONONUCLEAR NO ORGANISMS SEEN Performed at [facility] Performed at [facility]    Culture   Final    NO GROWTH 2 DAYS Performed at [facility]    Report Status PENDING  Incomplete  Urine culture     Status: None   Collection Time: 05/28/14 10:22 AM  Result Value Ref Range Status   Specimen Description URINE, CLEAN CATCH  Final   Special Requests NONE  Final   Colony Count   Final    50,000 COLONIES/ML Performed at [facility]    Culture   Final    Multiple bacterial morphotypes present, none predominant. Suggest appropriate recollection if clinically indicated. Performed at [facility]    Report Status 05/29/2014 FINAL  Final     Labs: Basic Metabolic Panel:  Recent  Labs Lab 05/27/14 2018 05/29/14 0011 05/30/14 0640  NA 134* 133* 136  K 4.6 4.4 4.5  CL 95* 95* 94*  CO2 32 35* 33*  GLUCOSE 141* 101* 192*  BUN 15 14 13   CREATININE 1.19* 1.19* 1.20*  CALCIUM 8.3* 8.6 8.1*   Liver Function Tests: No results for input(s): AST, ALT, ALKPHOS, BILITOT, PROT, ALBUMIN in the last 168 hours. No results for input(s): LIPASE, AMYLASE in the last 168 hours. No results for input(s): AMMONIA in the last 168 hours. CBC:  Recent Labs Lab 05/27/14 2018 05/29/14 0011 05/30/14 0640  WBC 10.2 10.2 6.8  NEUTROABS 8.9*  --   --   HGB 9.2* 8.8* 8.0*  HCT 28.6* 27.2* 25.1*  MCV 90.2 92.5 90.3  PLT 250 239 259   Cardiac Enzymes: No results for input(s): CKTOTAL, CKMB, CKMBINDEX, TROPONINI in the last 168 hours. BNP: BNP (last 3 results) No results for input(s): BNP in the last 8760 hours.  ProBNP (last 3 results) No results for input(s): PROBNP in the last 8760 hours.  CBG:  Recent Labs Lab 05/29/14 2025 05/29/14 2111 05/30/14 0012 05/30/14 0816 05/30/14 1225  GLUCAP 80 118* 203* 216* 232*       Signed:  Christian Borgerding K  [facility] 05/30/2014, 3:43 PM

## 2014-05-30 NOTE — Progress Notes (Signed)
Occupational Therapy Evaluation Patient Details Name: Marisa [name] MRN: [id number] DOB: 01-24-1941 Today's Date: 05/30/2014    History of Present Illness 74 y.o. female admitted with right shoulder effusion. Hx of rhematoid arthritis and COPD/asthma.   Clinical Impression   Pt presents with decreased functional use RUE due to new onset of R shoulder pain. ROM also limited due to RA. Pt will benefit from outpt OT to maximize functional use of RUE, in addition to educating pt/family on adaptive aids to maximize independence with ADL and preserve joint mobility and strength. Case manager contacted and notified of D/C needs.     Follow Up Recommendations  Outpatient OT;Supervision - Intermittent (neuro outpt OT)    Equipment Recommendations  None recommended by OT    Recommendations for Other Services       Precautions / Restrictions Precautions Precautions: None Restrictions Weight Bearing Restrictions: No      Mobility Bed Mobility Overal bed mobility: Modified Independent Bed Mobility: Supine to Sit;Sit to Supine     Transfers Overall transfer level: Modified independent Equipment used: Straight cane       General transfer comment: uses cane    Balance Overall balance assessment: Needs assistance Sitting-balance support: No upper extremity supported Sitting balance-Leahy Scale: Good     Standing balance support: No upper extremity supported Standing balance-Leahy Scale: Fair                              ADL Overall ADL's : Needs assistance/impaired                                     Functional mobility during ADLs: Modified independent General ADL Comments: Pt requires mod A for UB B/D, whereas before admission, pt only required assistance for gromming - reaching back of hair. Pt will benefit from OT to increase functional use of RUE in addition to educating pt on available adaptive aids to maximize independence with ADL                      Pertinent Vitals/Pain Pain Assessment: 0-10 Pain Score: 4  Faces Pain Scale: Hurts even more Pain Location: R shoulder Pain Descriptors / Indicators: Aching Pain Intervention(s): Limited activity within patient's tolerance;Monitored during session     Hand Dominance Right   Extremity/Trunk Assessment Upper Extremity Assessment Upper Extremity Assessment: RUE deficits/detail RUE Deficits / Details: AAROM supine 0-30 FF; ER able to reach neutral position; unable to abduct;sitting - unable to lift. ususally able to reach R hand to top of head. Pt has limitations at baseline, however, pt 's ROM PTA alowed her to be mod I with most ADL   Lower Extremity Assessment Lower Extremity Assessment: Defer to PT evaluation   Cervical / Trunk Assessment Cervical / Trunk Assessment: Kyphotic   Communication Communication Communication: No difficulties   Cognition Arousal/Alertness: Awake/alert Behavior During Therapy: WFL for tasks assessed/performed Overall Cognitive Status: Within Functional Limits for tasks assessed                     General Comments       Exercises Exercises: Shoulder (written exercises given)     Shoulder Instructions Shoulder Instructions Donning/doffing shirt without moving shoulder: Caregiver independent with task (educated on technique to reduce pain) Method for sponge bathing under operated UE: Caregiver independent with task Pendulum  exercises (written home exercise program): Caregiver independent with task;Patient able to independently direct caregiver ROM for elbow, wrist and digits of operated UE: Modified independent    [place] expects to be discharged to:: Private residence Living Arrangements: Spouse/significant other Available Help at Discharge: Family;Available 24 hours/day Type of Home: House Home Access: Stairs to enter [employer] of Steps: 6 Entrance Stairs-Rails:  Right;Left Home Layout: One level     Bathroom Shower/Tub: Tub/shower unit Shower/tub characteristics: Curtain Biochemist, clinical: Standard     Home Equipment: Cane - single point;Walker - 4 wheels;Shower seat;Transport chair          Prior Functioning/Environment Level of Independence: Needs assistance  Gait / Transfers Assistance Needed: cane use inside of house ADL's / Homemaking Assistance Needed: needs assist to bath. could dress herself PTA   Comments: Pt limited more PTA due to R shoulder pain    OT Diagnosis: Generalized weakness;Acute pain   OT Problem List: Decreased strength;Decreased range of motion;Decreased knowledge of use of DME or AE;Impaired UE functional use;Pain   OT Treatment/Interventions:      OT Goals(Current goals can be found in the care plan section) Acute Rehab OT Goals Patient Stated Goal: to get my shoulder better OT Goal Formulation: All assessment and education complete, DC therapy   End of Session Nurse Communication: Mobility status;Other (comment) (need for outpt OT)  Activity Tolerance: Patient tolerated treatment well Patient left: in bed;with call bell/phone within reach;with family/visitor present   Time: 1345-1410 OT Time Calculation (min): 25 min Charges:  OT General Charges $OT Visit: 1 Procedure OT Evaluation $Initial OT Evaluation Tier I: 1 Procedure OT Treatments $Therapeutic Activity: 8-22 mins G-Codes:    Arilyn Brierley,HILLARY June 25, 2014, 2:15 PM [facility], OTR/L  [phone] 06-25-14

## 2014-05-31 ENCOUNTER — Encounter (HOSPITAL_COMMUNITY): Payer: Self-pay | Admitting: *Deleted

## 2014-05-31 LAB — BODY FLUID CULTURE: Culture: NO GROWTH

## 2014-05-31 NOTE — Care Management Note (Signed)
    Page 1 of 1   05/31/2014     1:08:27 PM CARE MANAGEMENT NOTE 05/31/2014  Patient:  Marisa [name], Marisa [name]   Account Number:  [id number]  Date Initiated:  05/30/2014  Documentation initiated by:  Tomi Bamberger  Subjective/Objective Assessment:   dx joint effusion  admit     Action/Plan:   pt eval- outpt pt   Anticipated DC Date:  05/30/2014   Anticipated DC Plan:  [place]  CM consult      Choice offered to / List presented to:             Status of service:  Completed, signed off Medicare Important Message given?  YES (If response is "NO", the following Medicare IM given date fields will be blank) Date Medicare IM given:  05/30/2014 Medicare IM given by:  Tomi Bamberger Date Additional Medicare IM given:   Additional Medicare IM given by:    Discharge Disposition:  HOME/SELF CARE  Per UR Regulation:  Reviewed for med. necessity/level of care/duration of stay  If discussed at [place] of Stay Meetings, dates discussed:    Comments:  05/30/14 [place] ,BSN [phone] patient dc with outpt physical therapy.

## 2014-06-02 LAB — ANAEROBIC CULTURE

## 2014-06-14 ENCOUNTER — Encounter: Payer: Self-pay | Admitting: Occupational Therapy

## 2014-06-14 ENCOUNTER — Ambulatory Visit: Payer: Medicare Other | Attending: Internal Medicine | Admitting: Occupational Therapy

## 2014-06-14 DIAGNOSIS — R531 Weakness: Secondary | ICD-10-CM | POA: Diagnosis not present

## 2014-06-14 DIAGNOSIS — M25511 Pain in right shoulder: Secondary | ICD-10-CM

## 2014-06-14 NOTE — Therapy (Signed)
[facility] [address] [place], [place], [address] Phone: [phone]   Fax:  [phone]  Occupational Therapy Evaluation  Patient Details  Name: Marisa [name] MRN: [id number] Date of Birth: 24-Sep-1940 Referring Provider:  Tommy Medal, MD  Encounter Date: 06/14/2014      OT End of Session - 06/14/14 1639    Visit Number 1   Number of Visits 17   Date for OT Re-Evaluation 08/09/14   Authorization Type Medicare G code   OT Start Time 1540   OT Stop Time 1630   OT Time Calculation (min) 50 min   Activity Tolerance Patient limited by fatigue   Behavior During Therapy [facility] for tasks assessed/performed      Past Medical History  Diagnosis Date  . Diabetic ketoacidosis 11/2010  . Myocardial infarction     Chest pain s/p normal cath in 08/2004 then NSTEMI during 11/2010 admission, negative Myoview  . Diabetes mellitus type 2, insulin dependent     initial diagnoses 11/2008  . GERD (gastroesophageal reflux disease)   . Esophageal stricture 06/2004    Dilation 06/2004  . Supraventricular tachycardia   . history of  pericarditis 12/2002  . Pericardial effusion   . Rheumatoid arthritis(714.0)     On MTX and chronic steroids  . Osteoarthritis   . Asthma     Home 3L O2   . Seizure 11/2008  . Rectal bleeding 11/2010    Large hemorrhoids  . Chronic anemia   . History of blood transfusion   . Diverticulosis   . Anemia   . Family history of anesthesia complication     DAUGHTER HAS NAUSEA  . Asthma   . Hypertension   . Diabetes mellitus without complication   . Arthritis   . Rheumatoid arteritis     Past Surgical History  Procedure Laterality Date  . Cholecystectomy    . Abdominal hysterectomy    . Incise and drain abcess  09/2011    I&D of peri-rectal abcess per Dr Zella Richer.   . Colonoscopy N/A 01/06/2013    Procedure: COLONOSCOPY;  Surgeon: Ladene Artist, MD;  Location: [facility] ENDOSCOPY;  Service: Endoscopy;   Laterality: N/A;  . Esophagogastroduodenoscopy N/A 02/11/2013    Procedure: ESOPHAGOGASTRODUODENOSCOPY (EGD);  Surgeon: Irene Shipper, MD;  Location: [facility] ENDOSCOPY;  Service: Endoscopy;  Laterality: N/A;    There were no vitals taken for this visit.  Visit Diagnosis:  Right shoulder pain - Plan: Ot plan of care cert/re-cert  Weakness generalized - Plan: Ot plan of care cert/re-cert      Subjective Assessment - 06/14/14 1547    Symptoms Pain in right shoulder   Pertinent History see snapshot   Currently in Pain? Yes   Pain Score 4    Pain Location Shoulder   Pain Orientation Right   Pain Descriptors / Indicators Aching   Pain Type Acute pain          [facility] OT Assessment - 06/14/14 0001    Assessment   Diagnosis right shoulder effusion   Onset Date 05/27/14   Prior Therapy home health therapy   Precautions   Precautions Other (comment)  02 via [place] 2.5 l   Precaution Comments asthma   Balance Screen   Has the patient fallen in the past 6 months No   Home  Environment   Family/patient expects to be discharged to: Private residence   Living Arrangements Spouse/significant other   Available Help at Discharge Family  Type of [place] One level   Bathroom Shower/Tub Tub/Shower unit;Curtain   Shower/tub characteristics Lexicographer Yes   How accessible Accessible via Arts administrator seat;Cane - single point;Walker - 4 wheels   Lives With Spouse   Prior Function   Level of Independence Requires assistive device for independence;Needs assistance with ADLs   ADL   Grooming Brushing hair   Grooming Patient Percentage 80%   Upper Body Bathing Moderate assistance   Upper Body Bathing: Patient Percentage 50%   Lower Body Bathing Moderate assistance   Lower Body Bathing Patient Percentage 50%   Upper Body Dressing Moderate assistance   Lower Body Dressing Minimal assistance    Tub/Shower Transfer Minimal assistance   Written Expression   Dominant Hand Right   Vision Assessment   Eye Alignment Within Functional Limits   Vision Assessment Vision not tested   Comment wears glasses all the time   Activity Tolerance   Activity Tolerance Tolerates < min activity, no significant change in vital signs   Cognition   Overall Cognitive Status Impaired/Different from baseline   Behaviors Other (comment)  reports feeling slow, mentally tired   Coordination   Box and Blocks r:40, l:38   ROM / Strength   AROM / PROM / Strength PROM   AROM   Overall AROM  Deficits   Overall AROM Comments right shoulder flexion limited to 80 degrees, limited shoulder abduction to 95 degrees   PROM   Overall PROM  Deficits   Overall PROM Comments scaption right shoulder 140   Strength   Overall Strength Deficits   Overall Strength Comments 3+/5 shoulders bilaterally   Hand Function   Right Hand Grip (lbs) 25 lbs   Left Hand Grip (lbs) 18 lbs                       OT Education - 06/14/14 1638    Education provided Yes   Education Details Balance activity and rest to improve function within activity tolerance   Person(s) Educated Patient;Spouse   Methods Explanation   Comprehension Verbalized understanding          OT Short Term Goals - 06/14/14 1708    OT SHORT TERM GOAL #1   Title Patient will complete home exercise program with min cueing from husband, caregiver due 3/29   OT SHORT TERM GOAL #2   Time 4   Period Weeks   Status New   OT SHORT TERM GOAL #3   Time 4   Period Weeks   Status New   OT SHORT TERM GOAL #4   Time 4   Period Weeks   Status New           OT Long Term Goals - 06/14/14 1708    OT LONG TERM GOAL #1   Title Patient will be independent with her home exercise program due 4/26   OT LONG TERM GOAL #4   Time 8   Period Weeks   Status New               Plan - 06/14/14 1641    Clinical Impression Statement Patient  is a 74 year old female with recent hospitalization for right shoulder effusion.  Patient with complicated medical history, and has reported significant decrease in functional activity and independence over past 18 months.   Pt  will benefit from skilled therapeutic intervention in order to improve on the following deficits (Retired) Cardiopulmonary status limiting activity;Decreased activity tolerance;Decreased balance;Decreased cognition;Decreased knowledge of use of DME;Decreased strength;Decreased range of motion;Increased edema;Impaired UE functional use;Pain   Rehab Potential Good   Clinical Impairments Affecting Rehab Potential O2 dependent - 2.5 l, decline in function over past year- 18 mos   OT Frequency 2x / week   OT Duration 8 weeks   OT Treatment/Interventions Self-care/ADL training;Cryotherapy;Moist Heat;Parrafin;Energy conservation;Therapeutic exercise;Contrast Bath;Fluidtherapy;DME and/or AE instruction;Functional Mobility Training;Manual Therapy;Therapeutic activities;Therapeutic exercises;Patient/family education   Plan Begin home exercise program - review exercises from home, and add - strengthening - putty, needs shoulder range of motion   OT Home Exercise Plan Patient asked to bring in current exercises   Recommended Other Services Has PT eval Friday 3/4   Consulted and Agree with Plan of Care Patient   Family Member Consulted Spouse          G-Codes - 2014/06/23 1659    Functional Assessment Tool Used box and blocks   Functional Limitation Carrying, moving and handling objects   Carrying, Moving and Handling Objects Current Status (D4287) At least 40 percent but less than 60 percent impaired, limited or restricted   Carrying, Moving and Handling Objects Goal Status (G8115) At least 20 percent but less than 40 percent impaired, limited or restricted      Problem List Patient Active Problem List   Diagnosis Date Noted  . Immunosuppressed status   . Joint pain   . RA  (rheumatoid arthritis)   . Joint effusion 05/28/2014  . Shoulder effusion 05/28/2014  . Rheumatoid arthritis 05/28/2014  . UTI (urinary tract infection) 05/28/2014  . Diabetes mellitus type 2, insulin dependent 05/28/2014  . COPD exacerbation 05/28/2014  . Symptomatic anemia 04/13/2014  . Diabetes mellitus type 2, controlled 04/13/2014  . Rheumatoid arthritis 04/13/2014  . Essential hypertension 04/13/2014  . Hypoglycemia 01/04/2014  . Acute encephalopathy 01/04/2014  . Iron deficiency anemia secondary to blood loss (chronic) 03/07/2013  . Chronic respiratory failure 02/11/2013  . Dyspnea 02/11/2013  . 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 Acute blood loss anemia 02/10/2013  . Gastroenteritis 02/10/2013  . Blood in stool 01/05/2013  . Personal history of colonic polyps 01/05/2013  . Anemia 01/04/2013  . Iron deficiency 02/26/2012  . SOB (shortness of breath) 02/26/2012  . Hyponatremia 02/26/2012  . AKI (acute kidney injury) 02/26/2012  . MRSA cellulitis 10/08/2011  . Abscess of buttock, right 10/08/2011  . Sinus tachycardia 10/08/2011  . Protein calorie malnutrition 10/08/2011  . SIRS (systemic inflammatory response syndrome) 09/29/2011  . Tachycardia 09/29/2011  . Diabetes mellitus type 2, insulin dependent   . Chronic anemia   . NEOPLASM, MALIGNANT, CARCINOMA, BASAL CELL, NOSE 06/08/2007  . POLYP, COLON 06/08/2007  . HYPOKALEMIA 06/08/2007  . Anemia, normocytic normochromic 06/08/2007  . ANXIETY 06/08/2007  . HYPERTENSION 06/08/2007  . UNSPECIFIED ACUTE PERICARDITIS 06/08/2007  . SINUS ARRHYTHMIA 06/08/2007  . HEMORRHOIDS, INTERNAL 06/08/2007  . ALLERGIC RHINITIS 06/08/2007  . BRONCHITIS 06/08/2007  . ASTHMA 06/08/2007  . COPD 06/08/2007  . ESOPHAGEAL STRICTURE 06/08/2007  . GERD 06/08/2007  . GASTRITIS 06/08/2007  . CONSTIPATION, CHRONIC 06/08/2007  . IRRITABLE BOWEL SYNDROME 06/08/2007  . Rheumatoid arthritis(714.0) 06/08/2007  . SLEEP APNEA 06/08/2007  .  HEADACHE, CHRONIC 06/08/2007  . NEOPLASM, MALIGNANT, CARCINOMA, BASAL CELL, NOSE 06/08/2007    Mariah Milling 23-Jun-2014, 5:14 PM  [facility] [address] [place], [place], [address] Phone:  [phone]   Fax:  [phone]

## 2014-06-17 ENCOUNTER — Ambulatory Visit: Payer: Medicare Other | Admitting: Physical Therapy

## 2014-06-19 ENCOUNTER — Other Ambulatory Visit: Payer: Self-pay | Admitting: Oncology

## 2014-06-23 ENCOUNTER — Telehealth: Payer: Self-pay | Admitting: Nurse Practitioner

## 2014-06-23 ENCOUNTER — Ambulatory Visit (HOSPITAL_BASED_OUTPATIENT_CLINIC_OR_DEPARTMENT_OTHER): Payer: Medicare Other | Admitting: Oncology

## 2014-06-23 ENCOUNTER — Encounter: Payer: Self-pay | Admitting: Oncology

## 2014-06-23 ENCOUNTER — Other Ambulatory Visit (HOSPITAL_BASED_OUTPATIENT_CLINIC_OR_DEPARTMENT_OTHER): Payer: Medicare Other

## 2014-06-23 VITALS — BP 134/64 | HR 96 | Temp 98.4°F | Resp 19 | Ht <= 58 in | Wt 119.1 lb

## 2014-06-23 DIAGNOSIS — E611 Iron deficiency: Secondary | ICD-10-CM | POA: Diagnosis not present

## 2014-06-23 DIAGNOSIS — D649 Anemia, unspecified: Secondary | ICD-10-CM | POA: Diagnosis not present

## 2014-06-23 DIAGNOSIS — G8929 Other chronic pain: Secondary | ICD-10-CM

## 2014-06-23 DIAGNOSIS — M069 Rheumatoid arthritis, unspecified: Secondary | ICD-10-CM | POA: Diagnosis not present

## 2014-06-23 DIAGNOSIS — K625 Hemorrhage of anus and rectum: Secondary | ICD-10-CM | POA: Diagnosis not present

## 2014-06-23 DIAGNOSIS — N183 Chronic kidney disease, stage 3 unspecified: Secondary | ICD-10-CM

## 2014-06-23 LAB — CBC WITH DIFFERENTIAL/PLATELET
BASO%: 0.3 % (ref 0.0–2.0)
BASOS ABS: 0 10*3/uL (ref 0.0–0.1)
EOS%: 2.1 % (ref 0.0–7.0)
Eosinophils Absolute: 0.3 10*3/uL (ref 0.0–0.5)
HCT: 30.3 % — ABNORMAL LOW (ref 34.8–46.6)
HEMOGLOBIN: 9.4 g/dL — AB (ref 11.6–15.9)
LYMPH#: 0.5 10*3/uL — AB (ref 0.9–3.3)
LYMPH%: 3.4 % — ABNORMAL LOW (ref 14.0–49.7)
MCH: 28.4 pg (ref 25.1–34.0)
MCHC: 31 g/dL — AB (ref 31.5–36.0)
MCV: 91.5 fL (ref 79.5–101.0)
MONO#: 1 10*3/uL — ABNORMAL HIGH (ref 0.1–0.9)
MONO%: 6.8 % (ref 0.0–14.0)
NEUT#: 12.7 10*3/uL — ABNORMAL HIGH (ref 1.5–6.5)
NEUT%: 87.4 % — ABNORMAL HIGH (ref 38.4–76.8)
PLATELETS: 250 10*3/uL (ref 145–400)
RBC: 3.31 10*6/uL — ABNORMAL LOW (ref 3.70–5.45)
RDW: 14.3 % (ref 11.2–14.5)
WBC: 14.5 10*3/uL — AB (ref 3.9–10.3)

## 2014-06-23 NOTE — Progress Notes (Signed)
OFFICE PROGRESS NOTE   June 23, 2014   Physicians:R.Minna Antis, M.Wynell Balloon, (Rosenbower)  INTERVAL HISTORY:   Patient is seen, together with husband, in continuing attention to multifactorial anemia, with most exacerbations apparently related to intermittent GI bleeding. She was last transfused PRBCs during hospitalization 04-14-14; last feraheme was 10-2013.   Patient was hospitalized 12-30 thru 04-14-14 with hemoglobin down to 6.8, and also hospitalized 2-13 thru 05-30-14 with respiratory problems and right shoulder pain, with culture negative fluid aspirated from the shoulder. Hemoglobin 05-30-14 was 8.0. She stopped bid Integra a week ago because of "tarry stools"; iron studies 03-2014 had serum iron 52, %sat 26 and ferritin 189. Erythropoietin level 02-2013 was 104, B12 and folate normal. Significant comorbidities include rheumatoid arthritis x 20 years on chronic steroids, asthma on continuous home O2, diabetes on insulin, and chronic kidney disease.   Patient has felt comparatively well in past couple of weeks, with only noted bleeding being some minimal BRB per rectum with bowel movements. Stool is not black or tarry since she has held oral iron, which was not bothering her otherwise. She has been referred from dentist to oral surgeon due to loose and slightly painful lower front teeth, for dental extractions of ~ 4 planned; she is aware that there is concern for this procedure due to previous [place] (DCd ~ a year ago). She complains of "headache" related to neck pain, worse when she sleeps in certain positions. She takes ~ 1 tramadol daily after the pain is very bothersome, with some improvement.     Review of systems as above, also: Noticed some "whistling" with respirations this am and slightly hoarse today, does have springtime allergies. Appetite at baseline. No other neurologic symptoms. Remainder of 10 point Review of Systems negative.  Objective:  Vital signs in last 24  hours:  BP 134/64 mmHg  Pulse 96  Temp(Src) 98.4 F (36.9 C) (Oral)  Resp 19  Ht 4\' 9"  (1.448 m)  Wt 119 lb 1.6 oz (54.023 kg)  BMI 25.77 kg/m2  SpO2  Weight down 3 lbs from 3 mo ago. Frail, chronically ill appearing lady on portable O2. Alert, oriented and appropriate. Ambulatory with rolling walker, which is the first time that I have seen her without WC.  Looks the most comfortable that I have seen her. Respirations not labored at rest with the O2.  HEENT:PERRL, sclerae not icteric. Oral mucosa moist without lesions, posterior pharynx clear. Lower front teeth not obviously infected, broken right upper tooth. Neck supple. No JVD.  Lymphatics:no cervical,supraclavicular adenopathy Resp: very diminished BS thruout, otherwise clear to auscultation bilaterally and no dullness to percussion bilaterally Cardio: regular rate and rhythm. No gallop. GI: abdomen soft, nontender, not distended, no mass or organomegaly. Normally active bowel sounds.  Musculoskeletal/ Extremities: 1+ pedal edema without cords, tenderness Neuro: speech fluent and appropriate, moves all extremities Skin without rash, ecchymosis, petechiae   Lab Results:  Results for orders placed or performed in visit on 06/23/14  CBC with Differential  Result Value Ref Range   WBC 14.5 (H) 3.9 - 10.3 10e3/uL   NEUT# 12.7 (H) 1.5 - 6.5 10e3/uL   HGB 9.4 (L) 11.6 - 15.9 g/dL   HCT 30.3 (L) 34.8 - 46.6 %   Platelets 250 145 - 400 10e3/uL   MCV 91.5 79.5 - 101.0 fL   MCH 28.4 25.1 - 34.0 pg   MCHC 31.0 (L) 31.5 - 36.0 g/dL   RBC 3.31 (L) 3.70 - 5.45 10e6/uL   RDW 14.3  11.2 - 14.5 %   lymph# 0.5 (L) 0.9 - 3.3 10e3/uL   MONO# 1.0 (H) 0.1 - 0.9 10e3/uL   Eosinophils Absolute 0.3 0.0 - 0.5 10e3/uL   Basophils Absolute 0.0 0.0 - 0.1 10e3/uL   NEUT% 87.4 (H) 38.4 - 76.8 %   LYMPH% 3.4 (L) 14.0 - 49.7 %   MONO% 6.8 0.0 - 14.0 %   EOS% 2.1 0.0 - 7.0 %   BASO% 0.3 0.0 - 2.0 %     Studies/Results:  No results  found.  Medications: I have reviewed the patient's current medications. Suggested she try to continue Integra once daily with Vit C tablet. Suggested trying tramadol 1 bid for neck pain and associated HA.  DISCUSSION: interval problems and medications as above discussed. Recommended that we check CBC ~ monthly to see if this will allow more timely prn transfusions. NOTE would transfuse 1 unit PRBCs for hgb <8.0 or if <8.5 and symptomatic.   Assessment/Plan: 1.Anemia: previously iron deficient likely from hemorrhoidal bleeding x months, multiple blood draws x years and inadequate oral absorption of iron, also may have element of anemia of chronic disease and/ or Arava. Resume oral iron, follow CBC monthly at this office for now. I will see her back in ~ 4 months. Would recheck iron studies if hgb drops again. 2.rheumatoid arthritis x 20 years, on long term daily prednisone followed by Dr Amil Amen.   3. Atrophic gastritis, hx adenomatous colon polyps, bleeding hemorrhoids: followed by Dr Fuller Plan.  4.diabetes on insulin and metformin, followed by Dr Minna Antis  5.history of esophageal stricture  6.asthma: on continuous O2 and home nebulizers  7.hx perirectal abscess, resolved with I&D  8.post cholecystectomy and abdominal hysterectomy  9.HTN, hx nonSTEMI  10.hx pericarditis  11.scoliosis and degenerative arthritis in spine + RA. I have told her to be sure to discuss the neck pain and associated HA with Dr Amil Amen at next appointment. Trial of bid tramadol as noted. 12.hx single seizure possibly related to DM  13 chronic kidney disease stage 3, creatinine more elevated on present labs.  14. Deconditioning related to chronic illness 15.chronic pain related to multiple problems 16.acute encephalopathy related to polypharmacy and hypoglycemia [id number].    Time spent 20 min including >50% counseling and coordination of care. They know that they can call earlier than scheduled appointments if  obvious bleeding or more symptoms of anemia Cc Drs Minna Antis and Jeri Lager, MD   06/23/2014, 8:15 PM

## 2014-06-23 NOTE — Telephone Encounter (Signed)
per pof to sch pt appt-gave pt copy of sch °

## 2014-06-24 ENCOUNTER — Ambulatory Visit: Payer: Medicare Other | Admitting: Physical Therapy

## 2014-07-11 ENCOUNTER — Ambulatory Visit: Payer: Medicare Other

## 2014-07-21 ENCOUNTER — Other Ambulatory Visit: Payer: Self-pay

## 2014-07-21 ENCOUNTER — Telehealth: Payer: Self-pay | Admitting: Oncology

## 2014-07-21 NOTE — Telephone Encounter (Signed)
Returned patients husband call  To reschedule his appointment

## 2014-07-25 ENCOUNTER — Other Ambulatory Visit (HOSPITAL_BASED_OUTPATIENT_CLINIC_OR_DEPARTMENT_OTHER): Payer: Medicare Other

## 2014-07-25 DIAGNOSIS — E611 Iron deficiency: Secondary | ICD-10-CM

## 2014-07-25 DIAGNOSIS — D649 Anemia, unspecified: Secondary | ICD-10-CM | POA: Diagnosis present

## 2014-07-25 LAB — CBC WITH DIFFERENTIAL/PLATELET
BASO%: 0.4 % (ref 0.0–2.0)
BASOS ABS: 0.1 10*3/uL (ref 0.0–0.1)
EOS%: 2 % (ref 0.0–7.0)
Eosinophils Absolute: 0.3 10*3/uL (ref 0.0–0.5)
HCT: 28.6 % — ABNORMAL LOW (ref 34.8–46.6)
HGB: 9 g/dL — ABNORMAL LOW (ref 11.6–15.9)
LYMPH%: 5.3 % — ABNORMAL LOW (ref 14.0–49.7)
MCH: 27 pg (ref 25.1–34.0)
MCHC: 31.5 g/dL (ref 31.5–36.0)
MCV: 85.7 fL (ref 79.5–101.0)
MONO#: 1.2 10*3/uL — ABNORMAL HIGH (ref 0.1–0.9)
MONO%: 8.2 % (ref 0.0–14.0)
NEUT%: 84.1 % — AB (ref 38.4–76.8)
NEUTROS ABS: 12 10*3/uL — AB (ref 1.5–6.5)
PLATELETS: 311 10*3/uL (ref 145–400)
RBC: 3.34 10*6/uL — AB (ref 3.70–5.45)
RDW: 14.1 % (ref 11.2–14.5)
WBC: 14.3 10*3/uL — ABNORMAL HIGH (ref 3.9–10.3)
lymph#: 0.8 10*3/uL — ABNORMAL LOW (ref 0.9–3.3)

## 2014-07-26 ENCOUNTER — Telehealth: Payer: Self-pay

## 2014-07-26 ENCOUNTER — Other Ambulatory Visit: Payer: Self-pay | Admitting: Oncology

## 2014-07-26 NOTE — Telephone Encounter (Signed)
Told Mr. Rufo the results of lab as noted below  by Dr. Marko Plume.  Ms. Kreiter is not more sob then usual.  She is feeling as well as she was at visit due to recent oral surgery. Husband wondered if Ms. Deignan should increase the iron to bid again.  Told him that if she could tolerate the iron bid that would be fine,otherwise, at least take daily.

## 2014-07-26 NOTE — Telephone Encounter (Signed)
-----   Message from Gordy Levan, MD sent at 07/26/2014  7:56 AM EDT ----- Labs seen and need follow up: please let her know hgb from 07-25-14 is just a little lower at 9.0, which is still above where she usually needs transfusion. She should call if she sees significant bleeding from rectum before we repeat CBC and iron studies at my visit in June. She needs to continue oral iron.

## 2014-08-04 ENCOUNTER — Telehealth: Payer: Self-pay | Admitting: Oncology

## 2014-08-04 NOTE — Telephone Encounter (Signed)
pt left vm wanting to r/s appt-cld & spoke to pt and gave pt r/s time & date

## 2014-08-18 ENCOUNTER — Telehealth: Payer: Self-pay | Admitting: Oncology

## 2014-08-18 ENCOUNTER — Other Ambulatory Visit: Payer: Self-pay

## 2014-08-18 NOTE — Telephone Encounter (Signed)
pt called to r/s appt..done...pt aware of new d.t °

## 2014-08-19 ENCOUNTER — Other Ambulatory Visit (HOSPITAL_BASED_OUTPATIENT_CLINIC_OR_DEPARTMENT_OTHER): Payer: Medicare Other

## 2014-08-19 ENCOUNTER — Telehealth: Payer: Self-pay | Admitting: *Deleted

## 2014-08-19 DIAGNOSIS — D649 Anemia, unspecified: Secondary | ICD-10-CM | POA: Diagnosis present

## 2014-08-19 DIAGNOSIS — E611 Iron deficiency: Secondary | ICD-10-CM

## 2014-08-19 LAB — CBC WITH DIFFERENTIAL/PLATELET
BASO%: 0.5 % (ref 0.0–2.0)
Basophils Absolute: 0.1 10*3/uL (ref 0.0–0.1)
EOS%: 2.1 % (ref 0.0–7.0)
Eosinophils Absolute: 0.2 10*3/uL (ref 0.0–0.5)
HEMATOCRIT: 29.5 % — AB (ref 34.8–46.6)
HGB: 9.5 g/dL — ABNORMAL LOW (ref 11.6–15.9)
LYMPH#: 0.5 10*3/uL — AB (ref 0.9–3.3)
LYMPH%: 4.3 % — ABNORMAL LOW (ref 14.0–49.7)
MCH: 27.3 pg (ref 25.1–34.0)
MCHC: 32.2 g/dL (ref 31.5–36.0)
MCV: 85 fL (ref 79.5–101.0)
MONO#: 0.7 10*3/uL (ref 0.1–0.9)
MONO%: 5.7 % (ref 0.0–14.0)
NEUT#: 10.5 10*3/uL — ABNORMAL HIGH (ref 1.5–6.5)
NEUT%: 87.4 % — AB (ref 38.4–76.8)
Platelets: 247 10*3/uL (ref 145–400)
RBC: 3.47 10*6/uL — AB (ref 3.70–5.45)
RDW: 14.3 % (ref 11.2–14.5)
WBC: 11.9 10*3/uL — AB (ref 3.9–10.3)

## 2014-08-19 NOTE — Telephone Encounter (Signed)
Hgb 9.5. Pt very pleased. Will be back in 1 month for repeat labs

## 2014-09-15 ENCOUNTER — Other Ambulatory Visit: Payer: Self-pay

## 2014-09-15 DIAGNOSIS — M0589 Other rheumatoid arthritis with rheumatoid factor of multiple sites: Secondary | ICD-10-CM | POA: Diagnosis not present

## 2014-09-15 DIAGNOSIS — M545 Low back pain: Secondary | ICD-10-CM | POA: Diagnosis not present

## 2014-09-15 DIAGNOSIS — Z79899 Other long term (current) drug therapy: Secondary | ICD-10-CM | POA: Diagnosis not present

## 2014-09-15 DIAGNOSIS — Z7952 Long term (current) use of systemic steroids: Secondary | ICD-10-CM | POA: Diagnosis not present

## 2014-09-16 ENCOUNTER — Telehealth: Payer: Self-pay

## 2014-09-16 ENCOUNTER — Other Ambulatory Visit (HOSPITAL_BASED_OUTPATIENT_CLINIC_OR_DEPARTMENT_OTHER): Payer: Medicare Other

## 2014-09-16 DIAGNOSIS — E611 Iron deficiency: Secondary | ICD-10-CM

## 2014-09-16 DIAGNOSIS — D649 Anemia, unspecified: Secondary | ICD-10-CM | POA: Diagnosis present

## 2014-09-16 LAB — CBC WITH DIFFERENTIAL/PLATELET
BASO%: 0.2 % (ref 0.0–2.0)
Basophils Absolute: 0 10*3/uL (ref 0.0–0.1)
EOS%: 1.3 % (ref 0.0–7.0)
Eosinophils Absolute: 0.2 10*3/uL (ref 0.0–0.5)
HEMATOCRIT: 32 % — AB (ref 34.8–46.6)
HEMOGLOBIN: 10.3 g/dL — AB (ref 11.6–15.9)
LYMPH%: 4 % — ABNORMAL LOW (ref 14.0–49.7)
MCH: 28.3 pg (ref 25.1–34.0)
MCHC: 32.2 g/dL (ref 31.5–36.0)
MCV: 87.9 fL (ref 79.5–101.0)
MONO#: 0.7 10*3/uL (ref 0.1–0.9)
MONO%: 5 % (ref 0.0–14.0)
NEUT#: 12.6 10*3/uL — ABNORMAL HIGH (ref 1.5–6.5)
NEUT%: 89.5 % — AB (ref 38.4–76.8)
Platelets: 210 10*3/uL (ref 145–400)
RBC: 3.64 10*6/uL — AB (ref 3.70–5.45)
RDW: 14.3 % (ref 11.2–14.5)
WBC: 14.1 10*3/uL — ABNORMAL HIGH (ref 3.9–10.3)
lymph#: 0.6 10*3/uL — ABNORMAL LOW (ref 0.9–3.3)

## 2014-09-16 NOTE — Telephone Encounter (Signed)
-----   Message from Gordy Levan, MD sent at 06/23/2014  3:35 PM EST ----- Will have CBCs every month until I see her again in 4 mo. We need to let her know results of Hgb, as she would need PRBCs if <8.0 or if more symptomatic and <8.5  thanks

## 2014-09-16 NOTE — Telephone Encounter (Signed)
Told Marisa [name] that her Hgb. = 10.3 today.  Pt. stated that she has not been bleeding as much lately.  She states that she is not more sob then usual.  Keep lab appointment as scheduled for 10-13-14 along with visit with Dr. Marko Plume.

## 2014-10-03 DIAGNOSIS — E119 Type 2 diabetes mellitus without complications: Secondary | ICD-10-CM | POA: Diagnosis not present

## 2014-10-03 DIAGNOSIS — I10 Essential (primary) hypertension: Secondary | ICD-10-CM | POA: Diagnosis not present

## 2014-10-03 DIAGNOSIS — D649 Anemia, unspecified: Secondary | ICD-10-CM | POA: Diagnosis not present

## 2014-10-09 ENCOUNTER — Other Ambulatory Visit: Payer: Self-pay | Admitting: Oncology

## 2014-10-10 ENCOUNTER — Other Ambulatory Visit: Payer: Self-pay

## 2014-10-13 ENCOUNTER — Encounter: Payer: Self-pay | Admitting: Oncology

## 2014-10-13 ENCOUNTER — Other Ambulatory Visit (HOSPITAL_BASED_OUTPATIENT_CLINIC_OR_DEPARTMENT_OTHER): Payer: Medicare Other

## 2014-10-13 ENCOUNTER — Telehealth: Payer: Self-pay | Admitting: Oncology

## 2014-10-13 ENCOUNTER — Other Ambulatory Visit: Payer: Self-pay

## 2014-10-13 ENCOUNTER — Ambulatory Visit (HOSPITAL_BASED_OUTPATIENT_CLINIC_OR_DEPARTMENT_OTHER): Payer: Medicare Other | Admitting: Oncology

## 2014-10-13 VITALS — BP 166/69 | HR 68 | Temp 97.7°F | Resp 18 | Ht <= 58 in | Wt 114.5 lb

## 2014-10-13 DIAGNOSIS — D649 Anemia, unspecified: Secondary | ICD-10-CM

## 2014-10-13 DIAGNOSIS — M069 Rheumatoid arthritis, unspecified: Secondary | ICD-10-CM

## 2014-10-13 DIAGNOSIS — N183 Chronic kidney disease, stage 3 unspecified: Secondary | ICD-10-CM

## 2014-10-13 DIAGNOSIS — E611 Iron deficiency: Secondary | ICD-10-CM

## 2014-10-13 DIAGNOSIS — K625 Hemorrhage of anus and rectum: Secondary | ICD-10-CM | POA: Diagnosis not present

## 2014-10-13 LAB — CBC WITH DIFFERENTIAL/PLATELET
BASO%: 0.2 % (ref 0.0–2.0)
Basophils Absolute: 0 10*3/uL (ref 0.0–0.1)
EOS%: 1.2 % (ref 0.0–7.0)
Eosinophils Absolute: 0.2 10*3/uL (ref 0.0–0.5)
HCT: 33 % — ABNORMAL LOW (ref 34.8–46.6)
HGB: 10.5 g/dL — ABNORMAL LOW (ref 11.6–15.9)
LYMPH#: 0.6 10*3/uL — AB (ref 0.9–3.3)
LYMPH%: 4.4 % — ABNORMAL LOW (ref 14.0–49.7)
MCH: 28.7 pg (ref 25.1–34.0)
MCHC: 31.8 g/dL (ref 31.5–36.0)
MCV: 90.2 fL (ref 79.5–101.0)
MONO#: 0.6 10*3/uL (ref 0.1–0.9)
MONO%: 4.7 % (ref 0.0–14.0)
NEUT#: 11.6 10*3/uL — ABNORMAL HIGH (ref 1.5–6.5)
NEUT%: 89.5 % — ABNORMAL HIGH (ref 38.4–76.8)
PLATELETS: 229 10*3/uL (ref 145–400)
RBC: 3.66 10*6/uL — ABNORMAL LOW (ref 3.70–5.45)
RDW: 14.3 % (ref 11.2–14.5)
WBC: 12.9 10*3/uL — AB (ref 3.9–10.3)

## 2014-10-13 LAB — IRON AND TIBC CHCC
%SAT: 41 % (ref 21–57)
IRON: 98 ug/dL (ref 41–142)
TIBC: 243 ug/dL (ref 236–444)
UIBC: 144 ug/dL (ref 120–384)

## 2014-10-13 LAB — FERRITIN CHCC: Ferritin: 93 ng/ml (ref 9–269)

## 2014-10-13 NOTE — Telephone Encounter (Signed)
Gave avs & calendar for  September & November.

## 2014-10-13 NOTE — Progress Notes (Signed)
OFFICE PROGRESS NOTE   October 13, 2014   Physicians: Jani Gravel, M.Wynell Balloon, (Rosenbower)  INTERVAL HISTORY:  Patient is seen, together with husband, in continuing follow up of multifactorial anemia which has been worsened related to intermittent GI bleeding. Counts have been stable over last several months. She was last transfused PRBCs in 03-2014 and last feraheme a year ago in 10-2013. Labs 02-2013 had erythropoietin level 104, B12 normal and folate normal. Iron and ferritin available after visit today have serum iron 98, %sat 41 and ferritin 93.  Patient has not had known GI bleeding recently. Multiple chronic problems unchanged, particularly rheumatoid arthritis for which she has been on steroids x years, need for continuous home O2, diabetes on insulin and chronic kidney disease. Breathing seems at baseline. She has generalized pain from arthritis, rates as 4/10 now.  PCP has changed to Dr Jani Gravel, whom she met for first time recently and is to see again in July. She has regular follow up with rheumatology.   Review of systems as above, also: No revent fever or symptoms of infection. No LE swelling. Appetite poor without nausea or other specific symptoms. Bowels are moving as usual.  Remainder of 10 point Review of Systems negative.  Objective:  Vital signs in last 24 hours:  BP 166/69 mmHg  Pulse 68  Temp(Src) 97.7 F (36.5 C) (Oral)  Resp 18  Ht $R'4\' 9"'OC$  (1.448 m)  Wt 114 lb 8 oz (51.937 kg)  BMI 24.77 kg/m2  SpO2 100% Weight down 8 lbs. Frail lady, looks older than stated age Alert, oriented and appropriate, very pleasant as always. Ambulatory with rolling walker and portable O2    HEENT:PERRL, sclerae not icteric. Oral mucosa moist without lesions, posterior pharynx clear.  Neck supple. No JVD.  Lymphatics:no cervical,supraclavicular adenopathy Resp: clear to auscultation bilaterally Cardio: regular rate and rhythm. No gallop. GI: soft, nontender including  epigastrium, not distended, no mass or organomegaly. Normally active bowel sounds.  Musculoskeletal/ Extremities: without pitting edema, cords, tenderness. Diminished muscle mass. Arthritic changes hands bilaterally, splint on right wrist. Neuro: speech fluent, no focal deficits. Skin without rash. Scattered ecchymoses extremities, skin thin   Lab Results:  Results for orders placed or performed in visit on 10/13/14  CBC with Differential  Result Value Ref Range   WBC 12.9 (H) 3.9 - 10.3 10e3/uL   NEUT# 11.6 (H) 1.5 - 6.5 10e3/uL   HGB 10.5 (L) 11.6 - 15.9 g/dL   HCT 33.0 (L) 34.8 - 46.6 %   Platelets 229 145 - 400 10e3/uL   MCV 90.2 79.5 - 101.0 fL   MCH 28.7 25.1 - 34.0 pg   MCHC 31.8 31.5 - 36.0 g/dL   RBC 3.66 (L) 3.70 - 5.45 10e6/uL   RDW 14.3 11.2 - 14.5 %   lymph# 0.6 (L) 0.9 - 3.3 10e3/uL   MONO# 0.6 0.1 - 0.9 10e3/uL   Eosinophils Absolute 0.2 0.0 - 0.5 10e3/uL   Basophils Absolute 0.0 0.0 - 0.1 10e3/uL   NEUT% 89.5 (H) 38.4 - 76.8 %   LYMPH% 4.4 (L) 14.0 - 49.7 %   MONO% 4.7 0.0 - 14.0 %   EOS% 1.2 0.0 - 7.0 %   BASO% 0.2 0.0 - 2.0 %  Iron and TIBC [facility]  Result Value Ref Range   Iron 98 41 - 142 ug/dL   TIBC 243 236 - 444 ug/dL   UIBC 144 120 - 384 ug/dL   %SAT 41 21 - 57 %  Ferritin  Result Value Ref Range   Ferritin 93 9 - 269 ng/ml     Studies/Results:  No results found.  Medications: I have reviewed the patient's current medications. Continue Integra Plus 1-2x daily  DISCUSSION: we are pleased that hemoglobin is holding in good range, tho certainly all of the other chronic medical problems are very difficult to manage and to tolerate. I have suggested that we change CBCs to every 2-3 months, tho certainly we can check at any time prior if concerns.   Assessment/Plan: 1.Anemia: previously iron deficient likely from hemorrhoidal bleeding x months, multiple blood draws x years and inadequate oral absorption of iron, also may have element of anemia of  chronic disease and/ or Arava. Iron studies some better, continue po iron. WIll follow CBC every 2-3 months and I will see her with labs in ~ 5 months. 2.rheumatoid arthritis x 20 years, on long term daily prednisone followed by Dr Amil Amen.  3. Atrophic gastritis, hx adenomatous colon polyps, bleeding hemorrhoids: followed by Dr Fuller Plan.  4.diabetes on insulin and metformin  5.history of esophageal stricture  6.asthma: on continuous O2 and home nebulizers  7.hx perirectal abscess, resolved with I&D  8.post cholecystectomy and abdominal hysterectomy  9.HTN, hx nonSTEMI  10.hx pericarditis  11.scoliosis and degenerative arthritis in spine + RA.  12.hx single seizure possibly related to DM  13 chronic kidney disease stage 3 14. Deconditioning related to chronic illness 15.chronic pain related to multiple problems 16.acute encephalopathy related to polypharmacy and hypoglycemia 12-2013.    Time spent 15 min including >50% counseling and coordination of care. Cc this note to Drs Maudie Mercury and Jeri Lager, MD   10/13/2014, 4:53 PM

## 2014-10-19 ENCOUNTER — Telehealth: Payer: Self-pay

## 2014-10-19 NOTE — Telephone Encounter (Signed)
-----   Message from Gordy Levan, MD sent at 10/15/2014 10:46 PM EDT ----- Please let her know iron studies looked good last week. She should continue the oral iron, once or twice daily ok.  She will have CBC Sept 9 - need to see result and let her know   thanks

## 2014-10-19 NOTE — Telephone Encounter (Signed)
Told Mr. Smestad the results of the iron studies as noted below by Dr. Marko Plume.  Husband verbalized understanding.

## 2014-10-31 DIAGNOSIS — E119 Type 2 diabetes mellitus without complications: Secondary | ICD-10-CM | POA: Diagnosis not present

## 2014-10-31 DIAGNOSIS — I10 Essential (primary) hypertension: Secondary | ICD-10-CM | POA: Diagnosis not present

## 2014-10-31 DIAGNOSIS — D509 Iron deficiency anemia, unspecified: Secondary | ICD-10-CM | POA: Diagnosis not present

## 2014-11-03 DIAGNOSIS — M059 Rheumatoid arthritis with rheumatoid factor, unspecified: Secondary | ICD-10-CM | POA: Diagnosis not present

## 2014-11-03 DIAGNOSIS — E119 Type 2 diabetes mellitus without complications: Secondary | ICD-10-CM | POA: Diagnosis not present

## 2014-11-03 DIAGNOSIS — D649 Anemia, unspecified: Secondary | ICD-10-CM | POA: Diagnosis not present

## 2014-11-03 DIAGNOSIS — I10 Essential (primary) hypertension: Secondary | ICD-10-CM | POA: Diagnosis not present

## 2014-12-08 DIAGNOSIS — Z23 Encounter for immunization: Secondary | ICD-10-CM | POA: Diagnosis not present

## 2014-12-08 DIAGNOSIS — I10 Essential (primary) hypertension: Secondary | ICD-10-CM | POA: Diagnosis not present

## 2014-12-08 DIAGNOSIS — M0570 Rheumatoid arthritis with rheumatoid factor of unspecified site without organ or systems involvement: Secondary | ICD-10-CM | POA: Diagnosis not present

## 2014-12-08 DIAGNOSIS — D649 Anemia, unspecified: Secondary | ICD-10-CM | POA: Diagnosis not present

## 2014-12-08 DIAGNOSIS — E119 Type 2 diabetes mellitus without complications: Secondary | ICD-10-CM | POA: Diagnosis not present

## 2014-12-23 ENCOUNTER — Other Ambulatory Visit (HOSPITAL_BASED_OUTPATIENT_CLINIC_OR_DEPARTMENT_OTHER): Payer: Medicare Other

## 2014-12-23 ENCOUNTER — Telehealth: Payer: Self-pay

## 2014-12-23 DIAGNOSIS — D649 Anemia, unspecified: Secondary | ICD-10-CM

## 2014-12-23 DIAGNOSIS — E611 Iron deficiency: Secondary | ICD-10-CM

## 2014-12-23 LAB — CBC WITH DIFFERENTIAL/PLATELET
BASO%: 0.4 % (ref 0.0–2.0)
Basophils Absolute: 0.1 10*3/uL (ref 0.0–0.1)
EOS%: 1.4 % (ref 0.0–7.0)
Eosinophils Absolute: 0.2 10*3/uL (ref 0.0–0.5)
HEMATOCRIT: 31.4 % — AB (ref 34.8–46.6)
HGB: 10.3 g/dL — ABNORMAL LOW (ref 11.6–15.9)
LYMPH%: 5.3 % — AB (ref 14.0–49.7)
MCH: 28.8 pg (ref 25.1–34.0)
MCHC: 32.8 g/dL (ref 31.5–36.0)
MCV: 87.8 fL (ref 79.5–101.0)
MONO#: 0.7 10*3/uL (ref 0.1–0.9)
MONO%: 5.4 % (ref 0.0–14.0)
NEUT#: 10.5 10*3/uL — ABNORMAL HIGH (ref 1.5–6.5)
NEUT%: 87.5 % — AB (ref 38.4–76.8)
Platelets: 250 10*3/uL (ref 145–400)
RBC: 3.58 10*6/uL — AB (ref 3.70–5.45)
RDW: 13.5 % (ref 11.2–14.5)
WBC: 12 10*3/uL — ABNORMAL HIGH (ref 3.9–10.3)
lymph#: 0.6 10*3/uL — ABNORMAL LOW (ref 0.9–3.3)

## 2014-12-23 NOTE — Telephone Encounter (Signed)
-----   Message from Gordy Levan, MD sent at 12/23/2014  3:53 PM EDT ----- Labs seen and need follow up: please let her know hemoglobin is stable at 10.3

## 2014-12-23 NOTE — Telephone Encounter (Signed)
Told Mr. Marisa [name] the results of the hemaoglobin as noted below by Dr. Marko Plume. Keep appointment 02-23-15 as scheduled.

## 2015-01-16 DIAGNOSIS — M0589 Other rheumatoid arthritis with rheumatoid factor of multiple sites: Secondary | ICD-10-CM | POA: Diagnosis not present

## 2015-01-16 DIAGNOSIS — M545 Low back pain: Secondary | ICD-10-CM | POA: Diagnosis not present

## 2015-01-16 DIAGNOSIS — M25562 Pain in left knee: Secondary | ICD-10-CM | POA: Diagnosis not present

## 2015-01-16 DIAGNOSIS — M25561 Pain in right knee: Secondary | ICD-10-CM | POA: Diagnosis not present

## 2015-02-03 ENCOUNTER — Other Ambulatory Visit: Payer: Self-pay | Admitting: Oncology

## 2015-02-03 ENCOUNTER — Telehealth: Payer: Self-pay | Admitting: Oncology

## 2015-02-03 NOTE — Telephone Encounter (Signed)
Called and left a message with new appointment per dr Marko Plume pof

## 2015-02-06 NOTE — Therapy (Signed)
[facility] [address] [place], [place], [address] Phone: [phone]   Fax:  [phone]  Patient Details  Name: Marisa [name] MRN: [id number] Date of Birth: Nov 06, 1940 Referring Provider:  Tommy Medal, MD  Encounter Date: 06/14/2014  OCCUPATIONAL THERAPY DISCHARGE SUMMARY  Visits from Start of Care: 1  Current functional level related to goals / functional outcomes: Unable to report changes - patient did not return for therapy.   Remaining deficits: Unable to comment - patient did not return.   Education / Equipment: NA  Plan:                                                    Patient goals were not met. Patient is being discharged due to not returning since the last visit.  ?????       Mariah Milling, OTR/L 02/06/2015, 12:18 PM  [employer] [address] [place], [place], [address] Phone: [phone]   Fax:  [phone]

## 2015-02-22 NOTE — Patient Outreach (Signed)
[facility]) Care Management  02/22/2015  Marisa [name] 01-Aug-1940 [id number]   Referral from [place] List, assigned Leanora Ivanoff, RN to outreach for [place] Management services.  Thanks, Ronnell Freshwater. [place], [place] Assistant Phone: [phone] Fax: [phone]

## 2015-02-23 ENCOUNTER — Other Ambulatory Visit: Payer: Self-pay

## 2015-02-23 ENCOUNTER — Ambulatory Visit: Payer: Self-pay | Admitting: Oncology

## 2015-02-24 ENCOUNTER — Other Ambulatory Visit: Payer: Self-pay

## 2015-02-24 DIAGNOSIS — E119 Type 2 diabetes mellitus without complications: Secondary | ICD-10-CM

## 2015-02-24 DIAGNOSIS — Z794 Long term (current) use of insulin: Principal | ICD-10-CM

## 2015-02-24 NOTE — Patient Outreach (Addendum)
[facility]) Care Management  02/24/2015  Marisa [name] 04/16/1940 [id number]   Telephone Screen  Referral Date: 02/22/15 Referral Source: NextGen Tier 4 Referral Reason: COPD, 2 admits per data as of 10/21/14 PCP: Dr. Jani Gravel  Outreach attempt #1 to patient. Patient reached and screening completed.  Social: Patient lives in her home with spouse Marisa [name]) who assists with her care needs. She also reports that she has a dtr that lives nearby who helps her out with her personal care. Patient reports that she fell "eleven days ago while in the bathroom." She does not recall exactly what happened. She has some mild bruising and small skin tear. She is using a cane for assistance. Patient denies pain but does report some soreness related to fall. She did not get evaluated after fall as she did not feel it was needed. She also has oxygen in the home that she uses.  Conditions: Past medical history includes: COPD with oxygen dependence, RA, CKD and DM. Patient is monitoring blood sugars in the home and blood sugar earlier today was 355 after her meal. Last A1C per records noted to be 6.3 (10/31/14).  Medications: She is taking more than 10 meds per her report. She does voice some concerns about being able to afford her insulin for DM. She is taking Novolog and Levemir pens and reports increased cost in these meds. She also states that her Premarin is very expensive as well.   Appointments:She has not seen PCP in a few months and plans to make appt soon. Patient states she has already received her flu shot but does not remember when she got it this year.  Plan: RN CM will notify [facility] administrative assistant that patient agreed to services and case opened. RN CM provided patient with [facility] contact info. RN CM will send [facility] SW referral for financial assessment. RN CM will send [facility] referral for polypharmacy med review and med management. RN CM will send [facility] introductory  welcome package along with consent form.  Enzo Montgomery, RN,BSN,CCM [facility] Telephonic Care Management Coordinator Direct Phone: [phone] Toll Free: [phone] Fax: [phone]

## 2015-02-25 ENCOUNTER — Emergency Department (INDEPENDENT_AMBULATORY_CARE_PROVIDER_SITE_OTHER): Payer: Medicare Other

## 2015-02-25 ENCOUNTER — Emergency Department (INDEPENDENT_AMBULATORY_CARE_PROVIDER_SITE_OTHER)
Admission: EM | Admit: 2015-02-25 | Discharge: 2015-02-25 | Disposition: A | Discharge status: Home / Self Care | Payer: Medicare Other | Source: Home / Self Care | Attending: Emergency Medicine | Admitting: Emergency Medicine

## 2015-02-25 ENCOUNTER — Encounter (HOSPITAL_COMMUNITY): Payer: Self-pay | Admitting: Emergency Medicine

## 2015-02-25 DIAGNOSIS — M533 Sacrococcygeal disorders, not elsewhere classified: Secondary | ICD-10-CM

## 2015-02-25 DIAGNOSIS — T148 Other injury of unspecified body region: Secondary | ICD-10-CM

## 2015-02-25 DIAGNOSIS — T148XXA Other injury of unspecified body region, initial encounter: Secondary | ICD-10-CM

## 2015-02-25 MED ORDER — TRAMADOL HCL 50 MG PO TABS: 50.0000 mg | ORAL_TABLET | Freq: Two times a day (BID) | ORAL | Status: DC | PRN

## 2015-02-25 NOTE — ED Notes (Signed)
Reports she sustained a fall 2 weeks ago in her bathroom; landed on coccyx Also noticed bruising under left eye  Denies head inj/LOC Walker present upon arrival A&O x4... No acute distress.

## 2015-02-25 NOTE — ED Provider Notes (Signed)
HPI  SUBJECTIVE:  Marisa [name] is a 74 y.o. female who presents with sharp, burning, constant pain, tenderness over the coccyx after having a fall 11-12 days ago. Patient is unable to remember the exact mechanism of the fall. She says she may have gotten dizzy but is not sure. She states that she fell between the tub and the commode. She denies chest pain, shortness of breath, palpitations, syncope causing the fall. She has not had any further falls. She has been ambulatory since the event. States that the pain is better with triple antibiotic ointment, tramadol, lorazepam, Aleve, worse with standing, sitting. She has not tried anything else for this. She denies hitting her head or her face. She denies headache, new/different neck pain, visual changes, dysarthria, aphasia, arm or leg weakness, facial droop. No chest pain, abdominal pain, back pain. No vomiting, fevers, weakness in her lower extremities, numbness or tingling in her lower extremities. Patient also reports a bruise inferior to her left eye starting yesterday. Patient denies trauma to the area. Patient has had this happen before, it resolved on its own.  Patient has an extensive past medical history including rheumatoid arthritis, chronic steroids, hypertension, diabetes, asthma on 3 L, O2, pericardial effusion, MI, DKA. She is not on any antiplatelets or anticoagulants. Past medical history of seizure 1 while admitted. She does not have a known seizure disorder.   Past Medical History  Diagnosis Date  . Diabetic ketoacidosis ([place]) 11/2010  . Myocardial infarction [facility])     Chest pain s/p normal cath in 08/2004 then NSTEMI during 11/2010 admission, negative Myoview  . Diabetes mellitus type 2, insulin dependent ([place])     initial diagnoses 11/2008  . GERD (gastroesophageal reflux disease)   . Esophageal stricture 06/2004    Dilation 06/2004  . Supraventricular tachycardia ( [place])   . history of  pericarditis 12/2002  . Pericardial  effusion   . Rheumatoid arthritis(714.0)     On MTX and chronic steroids  . Osteoarthritis   . Asthma     Home 3L O2   . Seizure ([place]) 11/2008  . Rectal bleeding 11/2010    Large hemorrhoids  . Chronic anemia   . History of blood transfusion   . Diverticulosis   . Anemia   . Family history of anesthesia complication     DAUGHTER HAS NAUSEA  . Asthma   . Hypertension   . Diabetes mellitus without complication ([place])   . Arthritis   . Rheumatoid arteritis   . Depression     patient feels depressed at the end of the month    Past Surgical History  Procedure Laterality Date  . Cholecystectomy    . Abdominal hysterectomy    . Incise and drain abcess  09/2011    I&D of peri-rectal abcess per Dr Zella Richer.   . Colonoscopy N/A 01/06/2013    Procedure: COLONOSCOPY;  Surgeon: Ladene Artist, MD;  Location[facility] ENDOSCOPY;  Service: Endoscopy;  Laterality: N/A;  . Esophagogastroduodenoscopy N/A 02/11/2013    Procedure: ESOPHAGOGASTRODUODENOSCOPY (EGD);  Surgeon: Irene Shipper, MD;  Location: [facility] ENDOSCOPY;  Service: Endoscopy;  Laterality: N/A;    Family History  Problem Relation Age of Onset  . Heart disease Mother   . Stroke Father     Social History  Substance Use Topics  . Smoking status: Never Smoker   . Smokeless tobacco: None  . Alcohol Use: No    No current facility-administered medications for this encounter.  Current outpatient prescriptions:  .  lisinopril (PRINIVIL,ZESTRIL) 10 MG tablet, Take 10 mg by mouth daily., Disp: , Rfl:  .  metoprolol succinate (TOPROL-XL) 100 MG 24 hr tablet, Take 100 mg by mouth daily. Take with or immediately following a meal., Disp: , Rfl:  .  predniSONE (DELTASONE) 5 MG tablet, , Disp: , Rfl:  .  pregabalin (LYRICA) 75 MG capsule, Take 75 mg by mouth daily. , Disp: , Rfl:  .  risperiDONE (RISPERDAL) 0.5 MG tablet, Take 0.5 tablets (0.25 mg total) by mouth 2 (two) times daily., Disp: 30 tablet, Rfl: 0 .  acetaminophen (TYLENOL) 500 MG  tablet, Take 500 mg by mouth every 6 (six) hours as needed (fatigue). , Disp: , Rfl:  .  albuterol (PROVENTIL) (5 MG/ML) 0.5% nebulizer solution, Take 2.5 mg by nebulization every 6 (six) hours as needed for wheezing or shortness of breath (shortness of breath). , Disp: , Rfl:  .  AMBULATORY NON FORMULARY MEDICATION, Continuous O2 @@ 2.5-3 LMP, Disp: , Rfl:  .  BD PEN NEEDLE NANO U/F 32G X 4 MM MISC, , Disp: , Rfl:  .  clotrimazole-betamethasone (LOTRISONE) cream, Apply 1 application topically 2 (two) times daily. , Disp: , Rfl:  .  Coenzyme Q10 (CO Q 10) 100 MG CAPS, Take 100 mg by mouth daily., Disp: , Rfl:  .  docusate sodium (COLACE) 100 MG capsule, Take 100 mg by mouth at bedtime. , Disp: , Rfl:  .  DULoxetine (CYMBALTA) 30 MG capsule, Take 1 capsule (30 mg total) by mouth daily., Disp: 30 capsule, Rfl: 0 .  estrogens, conjugated, (PREMARIN) 0.625 MG tablet, Take 0.625 mg by mouth daily. , Disp: , Rfl:  .  feeding supplement, ENSURE COMPLETE, (ENSURE COMPLETE) LIQD, Take 237 mLs by mouth 2 (two) times daily between meals., Disp: 60 Bottle, Rfl: 0 .  FeFum-FePoly-FA-B Cmp-C-Biot (INTEGRA PLUS) CAPS, Take 1 capsule by mouth 2 (two) times daily., Disp: 60 capsule, Rfl: 6 .  FREESTYLE LITE test strip, TEST 10 TIMES DAILY ICD E11.65, Disp: , Rfl: 10 .  hydrocortisone (ANUSOL-HC) 25 MG suppository, Insert one suppository daily as needed for hemorrhoids., Disp: 30 suppository, Rfl: 1 .  insulin aspart (NOVOLOG) 100 UNIT/ML injection, 0-9 Units, Subcutaneous, 3 times daily with meals CBG < 70: implement hypoglycemia protocol CBG 70 - 120: 0 units CBG 121 - 150: 1 unit CBG 151 - 200: 2 units CBG 201 - 250: 3 units CBG 251 - 300: 5 units CBG 301 - 350: 7 units CBG 351 - 400: 9 units CBG > 400: call MD, Disp: 10 mL, Rfl: 11 .  insulin detemir (LEVEMIR) 100 UNIT/ML injection, Inject 0.1 mLs (10 Units total) into the skin daily at 12 noon. (Patient taking differently: Inject 20 Units into the skin daily at 12  noon. ), Disp: , Rfl:  .  ipratropium (ATROVENT) 0.06 % nasal spray, Place 1 spray into both nostrils daily., Disp: , Rfl: 4 .  leflunomide (ARAVA) 20 MG tablet, Take 20 mg by mouth daily., Disp: , Rfl:  .  levalbuterol (XOPENEX HFA) 45 MCG/ACT inhaler, Inhale 1-2 puffs into the lungs every 4 (four) hours as needed for wheezing (wheezing). , Disp: , Rfl:  .  LEVEMIR FLEXTOUCH 100 UNIT/ML Pen, Inject 20 Units into the skin every morning. , Disp: , Rfl: 4 .  LORazepam (ATIVAN) 0.5 MG tablet, Take 1 tablet (0.5 mg total) by mouth at bedtime., Disp: 20 tablet, Rfl: 0 .  Multiple Vitamin (MULTIVITAMINS PO), Take 1 tablet by mouth. Daily. ,  Disp: , Rfl:  .  naproxen sodium (ANAPROX) 220 MG tablet, Take 220 mg by mouth daily as needed., Disp: , Rfl:  .  NOVOLOG FLEXPEN 100 UNIT/ML FlexPen, Inject 9 Units into the skin 3 (three) times daily as needed for high blood sugar. , Disp: , Rfl: 11 .  predniSONE (DELTASONE) 10 MG tablet, Take 10 mg by mouth daily with breakfast., Disp: , Rfl:  .  promethazine (PHENERGAN) 12.5 MG tablet, Take 12.5 mg by mouth every 6 (six) hours as needed for nausea., Disp: , Rfl:  .  traMADol (ULTRAM) 50 MG tablet, Take 1 tablet (50 mg total) by mouth every 12 (twelve) hours as needed for moderate pain., Disp: 20 tablet, Rfl: 0 .  vitamin C (ASCORBIC ACID) 500 MG tablet, Take 500 mg by mouth 2 (two) times daily., Disp: , Rfl:  .  Vitamin D-Vitamin K (VITAMIN K2-VITAMIN D3 PO), Take 1 tablet by mouth daily., Disp: , Rfl:  .  XOPENEX HFA 45 MCG/ACT inhaler, Inhale 1 puff into the lungs daily as needed for wheezing or shortness of breath. , Disp: , Rfl: 2  Allergies  Allergen Reactions  . Demerol [Meperidine] Other (See Comments)    Hallucinations  . Penicillins Swelling and Other (See Comments)    Throat swelling  . Enbrel [Etanercept] Swelling    Arm swelling   . Humira [Adalimumab] Swelling    Arm swelling   . Demerol [Meperidine] Itching and Rash  . Penicillins Itching  and Rash     ROS  As noted in HPI.   Physical Exam  BP 190/69 mmHg  Pulse 80  Temp(Src) 98.7 F (37.1 C) (Oral)  SpO2 93%  Constitutional: Well developed, well nourished, no acute distress Eyes:  EOMI,  PERRLA, conjunctiva normal bilaterally HENT: Normocephalic, bruise inferior to the left eye, no periorbital tenderness, crepitus, swelling,mucus membranes moist Respiratory: Normal inspiratory effort Cardiovascular: Normal rate GI: nondistended skin: No rash, skin intact Musculoskeletal: + tenderness over sacrum/coccyx. No bruising over hips, coccyx. Pain with right hip flexion, no pain with internal and external rotation hips bilaterally. Pelvis stable. Bilateral lower extremity exam within normal limits. L-spine normal.  Neurologic: Alert & oriented x 3, no focal neuro deficits. Speech clear. Patient able to ambulate independently. She is able to move around the exam room independently.  Psychiatric: Speech and behavior appropriate   ED Course   Medications - No data to display  Orders Placed This Encounter  Procedures  . DG Sacrum/Coccyx    Standing Status: Standing     Number of Occurrences: 1     Standing Expiration Date:     Order Specific Question:  Reason for Exam (SYMPTOM  OR DIAGNOSIS REQUIRED)    Answer:  fall, tenderness over coccyx r/o fx, dislocation     Comments:  fall tendernes ocer coccyx r/o fx, dislocation  . DG Pelvis 1-2 Views    Standing Status: Standing     Number of Occurrences: 1     Standing Expiration Date:     Order Specific Question:  Reason for Exam (SYMPTOM  OR DIAGNOSIS REQUIRED)    Answer:  fall, tenderness over coccyx r/o fx, dislocation     Comments:  fall tendernes ocer coccyx r/o fx, dislocation    No results found for this or any previous visit (from the past 18 hour(s)). Dg Pelvis 1-2 Views  02/25/2015  CLINICAL DATA:  Fall 1/2 weeks ago with persistent pain over the coccyx EXAM: PELVIS - 1-2 VIEW COMPARISON:  01/04/2014  FINDINGS: Bold fractures of the superior and inferior pubic rami are identified bilaterally. No acute fracture dislocation is noted. Degenerative change of the lumbar spine is seen. IMPRESSION: Chronic pubic rami fractures without acute abnormality. Electronically Signed   By: Inez Catalina M.D.   On: 02/25/2015 19:47   Dg Sacrum/coccyx  02/25/2015  CLINICAL DATA:  Fall 11 days ago, tenderness over coccyx. Rule out fracture/dislocation. EXAM: SACRUM AND COCCYX - 2+ VIEW COMPARISON:  CT abdomen and pelvis dated 01/04/2014. FINDINGS: There are old healed fractures within the bilateral superior and inferior pubic rami, as also seen on the earlier CT pelvis. There is diffuse osteopenia which limits characterization of osseous detail but there is no evidence of acute fracture line or displaced fracture fragment within the visualized portions of the osseous pelvis. No fracture or dislocation seen within the sacrum or coccyx. Extensive degenerative change again noted within the lower lumbar spine. Soft tissues about the pelvis are unremarkable. IMPRESSION: No acute fracture or dislocation seen. Chronic findings described above. Electronically Signed   By: Franki Cabot M.D.   On: 02/25/2015 19:50     ED Clinical Impression  Coccygeal pain  Contusion   ED Assessment/Plan Imaging coccyx as patient has history of chronic steroid use and localized tenderness. Doubt hip or pelvic fracture but will check x-ray to rule out pelvic fracture. Doubt hip fracture. Patient has no focal neurologic deficits, doubt significant intracranial injury.   Reviewed imaging independently.Pelvic fractures. No acute changes.. See radiology report for full details.  Discussed  imaging, MDM, plan and followup with patient . Discussed sn/sx that should prompt return to the UC or ED. Patient agrees with plan.   *This clinic note was created using Dragon dictation software. Therefore, there may be occasional mistakes despite careful  proofreading.  ?   Melynda Ripple, MD 02/25/15 2015

## 2015-02-27 ENCOUNTER — Encounter: Payer: Self-pay | Admitting: *Deleted

## 2015-02-27 NOTE — Patient Outreach (Signed)
[name]) Care Management  02/27/2015  Marisa [name] 12-25-1940 [id number]   Request from Budd Palmer, RN, to assign Pharmacy and SW, assigned Deanne Coffer, PharmD and [employer], [place].  Thanks, Ronnell Freshwater. [place], [place] Assistant Phone: [phone] Fax: [phone]

## 2015-03-03 ENCOUNTER — Other Ambulatory Visit: Payer: Self-pay | Admitting: Pharmacist

## 2015-03-03 NOTE — Patient Outreach (Signed)
[facility]) Care Management  [place]   03/03/2015  Marisa [name] December 26, 1940 [id number]  Subjective: Marisa [name] is a 74 y.o. female who was referred to [facility] for medication review and medication assistance. She is having a hard time affording her insulin and premarin.  She has Medicare A and B and BCBS supplement.   Objective:   Current Medications: Current Outpatient Prescriptions  Medication Sig Dispense Refill  . acetaminophen (TYLENOL) 500 MG tablet Take 500 mg by mouth every 6 (six) hours as needed (fatigue).     Marland Kitchen albuterol (PROVENTIL) (5 MG/ML) 0.5% nebulizer solution Take 2.5 mg by nebulization every 6 (six) hours as needed for wheezing or shortness of breath (shortness of breath).     . AMBULATORY NON FORMULARY MEDICATION Continuous O2 @@ 2.5-3 LMP    . BD PEN NEEDLE NANO U/F 32G X 4 MM MISC     . clotrimazole-betamethasone (LOTRISONE) cream Apply 1 application topically 2 (two) times daily.     . Coenzyme Q10 (CO Q 10) 100 MG CAPS Take 100 mg by mouth daily.    Marland Kitchen docusate sodium (COLACE) 100 MG capsule Take 100 mg by mouth at bedtime.     . DULoxetine (CYMBALTA) 30 MG capsule Take 1 capsule (30 mg total) by mouth daily. 30 capsule 0  . estrogens, conjugated, (PREMARIN) 0.625 MG tablet Take 0.625 mg by mouth daily.     . feeding supplement, ENSURE COMPLETE, (ENSURE COMPLETE) LIQD Take 237 mLs by mouth 2 (two) times daily between meals. 60 Bottle 0  . FeFum-FePoly-FA-B Cmp-C-Biot (INTEGRA PLUS) CAPS Take 1 capsule by mouth 2 (two) times daily. 60 capsule 6  . FREESTYLE LITE test strip TEST 10 TIMES DAILY ICD E11.65  10  . hydrocortisone (ANUSOL-HC) 25 MG suppository Insert one suppository daily as needed for hemorrhoids. 30 suppository 1  . insulin aspart (NOVOLOG) 100 UNIT/ML injection 0-9 Units, Subcutaneous, 3 times daily with meals CBG < 70: implement hypoglycemia protocol CBG 70 - 120: 0 units CBG 121 - 150: 1 unit CBG 151 -  200: 2 units CBG 201 - 250: 3 units CBG 251 - 300: 5 units CBG 301 - 350: 7 units CBG 351 - 400: 9 units CBG > 400: call MD 10 mL 11  . insulin detemir (LEVEMIR) 100 UNIT/ML injection Inject 0.1 mLs (10 Units total) into the skin daily at 12 noon. (Patient taking differently: Inject 20 Units into the skin daily at 12 noon. )    . ipratropium (ATROVENT) 0.06 % nasal spray Place 1 spray into both nostrils daily.  4  . leflunomide (ARAVA) 20 MG tablet Take 20 mg by mouth daily.    Marland Kitchen levalbuterol (XOPENEX HFA) 45 MCG/ACT inhaler Inhale 1-2 puffs into the lungs every 4 (four) hours as needed for wheezing (wheezing).     Ernest Mallick FLEXTOUCH 100 UNIT/ML Pen Inject 20 Units into the skin every morning.   4  . lisinopril (PRINIVIL,ZESTRIL) 10 MG tablet Take 10 mg by mouth daily.    Marland Kitchen LORazepam (ATIVAN) 0.5 MG tablet Take 1 tablet (0.5 mg total) by mouth at bedtime. 20 tablet 0  . metoprolol succinate (TOPROL-XL) 100 MG 24 hr tablet Take 100 mg by mouth daily. Take with or immediately following a meal.    . Multiple Vitamin (MULTIVITAMINS PO) Take 1 tablet by mouth. Daily.     . naproxen sodium (ANAPROX) 220 MG tablet Take 220 mg by mouth daily as needed.    Marland Kitchen  NOVOLOG FLEXPEN 100 UNIT/ML FlexPen Inject 9 Units into the skin 3 (three) times daily as needed for high blood sugar.   11  . predniSONE (DELTASONE) 10 MG tablet Take 10 mg by mouth daily with breakfast.    . predniSONE (DELTASONE) 5 MG tablet     . pregabalin (LYRICA) 75 MG capsule Take 75 mg by mouth daily.     . promethazine (PHENERGAN) 12.5 MG tablet Take 12.5 mg by mouth every 6 (six) hours as needed for nausea.    . risperiDONE (RISPERDAL) 0.5 MG tablet Take 0.5 tablets (0.25 mg total) by mouth 2 (two) times daily. 30 tablet 0  . traMADol (ULTRAM) 50 MG tablet Take 1 tablet (50 mg total) by mouth every 12 (twelve) hours as needed for moderate pain. 20 tablet 0  . vitamin C (ASCORBIC ACID) 500 MG tablet Take 500 mg by mouth 2 (two) times  daily.    . Vitamin D-Vitamin K (VITAMIN K2-VITAMIN D3 PO) Take 1 tablet by mouth daily.    Penne Lash HFA 45 MCG/ACT inhaler Inhale 1 puff into the lungs daily as needed for wheezing or shortness of breath.   2   No current facility-administered medications for this visit.    Functional Status: In your present state of health, do you have any difficulty performing the following activities: 05/28/2014 04/14/2014  Hearing? N N  Vision? N N  Difficulty concentrating or making decisions? N N  Walking or climbing stairs? Y N  Dressing or bathing? N N  Doing errands, shopping? Y N    Fall/Depression Screening: PHQ 2/9 Scores 02/24/2015  PHQ - 2 Score 0    Assessment: Drugs sorted by system:  Neurologic/Psychologic: duloxetine, lorazepam, risperidone  Cardiovascular: metoprolol succinate, lisinopril   Pulmonary/Allergy: albuterol nebs, ipratropium nasal spray, Xopenex  Gastrointestinal: docusate, feeding supplement, promethazine  Endocrine: Novolog, Levemir  Renal:  Infectious Diseases:  Topical: clotrimazole-betamethasone, hydrocortisone suppository  Pain: acetaminophen, naproxen, pregabalin, tramadol  Vitamins/Minerals: coenzyme Q10, MVI, vitamin C, vitamin D- vitamin K  Miscellaneous: Premarin, prednisone  Findings:  Duplications in therapy: none noted  Gaps in therapy: history of GI bleed and anemia- aspirin not appropriate. On a beta blocker and ACEi but no statin   Medications to avoid in the elderly: lorazepam and oral estrogens  Drug interactions: none noted  Inappropriate dose: none noted  Other issues noted: none  1. Medication review: no issues noted. Patient is not on a statin.   2. Medication assistance: patient reports that she needs assistance with her insulin and premarin   Plan: 1. Medication review: will send fax to primary care provider to consider initiation of a statin in this patient.  2. Medication assistance: will send a referral to  [place], care management assistant, for assistance with the Medicare Extra Help application and possibly patient assistance program applications for the insulin. Patient verbalized understanding.   Nicoletta Ba, PharmD, [facility] [phone]

## 2015-03-06 ENCOUNTER — Other Ambulatory Visit: Payer: Self-pay

## 2015-03-06 DIAGNOSIS — D649 Anemia, unspecified: Secondary | ICD-10-CM

## 2015-03-06 MED ORDER — INTEGRA PLUS PO CAPS: 1.0000 | ORAL_CAPSULE | Freq: Two times a day (BID) | ORAL | Status: DC

## 2015-03-08 ENCOUNTER — Ambulatory Visit: Payer: Self-pay | Admitting: *Deleted

## 2015-03-08 ENCOUNTER — Other Ambulatory Visit: Payer: Self-pay | Admitting: *Deleted

## 2015-03-08 NOTE — Patient Outreach (Signed)
[facility]) Care Management  03/08/2015  Marisa [name] [birth year] [id number]   CSW was able to make initial contact with patient today, but only briefly, as patient reported it was not a good time to talk. CSW reminded patient that CSW has rescheduled, per patient's request, the initial phone call to patient, on four different occasions.  CSW wanted to confirm that patient was really interested in receiving social work services through [place] with [facility].  Patient then questioned, "Well, I guess it depends upon what you have to offer".  CSW proceeded to introduce self, explain role and types of services provided through [place] [facility] ([place] [facility]).  CSW further explained to patient that CSW works with patient's RNCM, also with [place] [facility], Kandra Nicolas Florance. CSW then explained the reason for the call, indicating that Mrs. Florance felt that patient may benefit from social work involvement to assist patient with finances, as well as with possible completion of an Adult Medicaid application through the [place].  CSW then obtained two HIPAA compliant identifiers from patient before proceeding any further with the conversation, which included patient's name and date of birth. Patient admitted having difficulty paying medical expenses, as well as monthly housing expenses.  Patient went on to explain to [place] that her husband, Hayle Parisi, as well as her daughter, Valene Villa currently live with patient in the home.  CSW inquired about their total combined income.  Patient reported that she and Mr. Allport receive [place] and that their daughter is employed and receiving monthly income.  CSW explained to patient that as long as the three of them are living under the same roof, their total income combined would be factored in to whether or not anyone in the  household would qualify for any type of government assistance (i.e. Adult Medicaid, Food Stamps, Supplemental Income, etc., all through the [place]).  Unfortunately, patient is aware that their combined monthly income far exceeds the [place] for the [place] for fiscal year 2016; therefore, making them ineligible to receive any type of financial assistance.  After this being said, patient reported no additional social work needs at present. CSW will perform a case closure on patient, as all goals of treatment have been met from social work standpoint and no additional social work needs have been identified at this time. CSW will notify patient's RNCM with [place] [facility], Enzo Montgomery of CSW's plans to close patient's case. CSW will fax a correspondence letter to patient's Primary Care Physician, Dr. Tommy Medal ensure that Dr. Minna Antis is aware of CSW's involvement with patient. CSW will submit a case closure request to Lurline Del, Care Management Assistant with [place] [facility], in the form of an In [employer].  CSW will ensure that Mrs. Laurance Flatten is aware of Roshanda Florance's, RNCM with [place] [facility], continued involvement with patient's care.  Nat Christen, BSW, MSW, LCSW  Licensed Education officer, environmental [facility]  Mailing [place] [address], [place], [place] [address] Physical Address-[address], [place], [place] [address] Toll Free Main # [phone] Fax # [phone] Cell # [phone]  Fax # [phone]  [email or web address]

## 2015-03-13 ENCOUNTER — Ambulatory Visit: Payer: Self-pay | Admitting: Oncology

## 2015-03-13 ENCOUNTER — Other Ambulatory Visit: Payer: Self-pay

## 2015-03-13 ENCOUNTER — Other Ambulatory Visit: Payer: Self-pay | Admitting: *Deleted

## 2015-03-13 NOTE — Progress Notes (Signed)
Received call from Mikey Bussing, NP - she states patient was a no show at visit today. Called patient - she states she was not feeling well today and attempted to call [facility] and cancel appt but never reached anyone. Discussed with Dr. Marko Plume and POF placed for patient to be rescheduled with Erasmo Downer at her next availability. Called back and spoke with patient and let her know to be expecting a call from [facility] scheduling with new appts for lab and [place]. Patient verbalizes understanding. No new concerns noted at this time - patient states she was just tired and having a hard time with her arthritis today which is why she was unable to make the appt.

## 2015-03-14 ENCOUNTER — Other Ambulatory Visit: Payer: Self-pay

## 2015-03-14 ENCOUNTER — Telehealth: Payer: Self-pay | Admitting: Oncology

## 2015-03-14 NOTE — Patient Outreach (Signed)
[place] [facility]) Care Management  03/14/2015   Marisa [name] [birth year] [id number]   Initial Assessment  S/O: Patient reached by phone and [name] assessment completed with patient and spouse. Patient denies any new issues or concerns at present. She has upcoming appt with PCP soon. Spouse will be taking her to appt. Appetite remains fair to good. She is drinking supplements as needed. Patient states her blood sugar was 63 this am. She reports some hypoglycemic symptoms. Patient ate and rechecked blood sugar was 223. Patient states she has intermittent periods of hypoglycemia. Discussed with patient ways to manage and treat. Advance care planning discussed with patient. She has not completed any directives. Patient was interested and agreeable to receiving further information on advance directives. Patient has spoken with Marisa [name] pharmacist for possible medication assistance to help with cost of meds.    Current Medications:  Current Outpatient Prescriptions  Medication Sig Dispense Refill  . acetaminophen (TYLENOL) 500 MG tablet Take 500 mg by mouth every 6 (six) hours as needed (fatigue).     Marisa [name] Kitchen albuterol (PROVENTIL) (5 MG/ML) 0.5% nebulizer solution Take 2.5 mg by nebulization every 6 (six) hours as needed for wheezing or shortness of breath (shortness of breath).     . AMBULATORY NON FORMULARY MEDICATION Continuous O2 @@ 2.5-3 LMP    . BD PEN NEEDLE NANO U/F 32G X 4 MM MISC     . clotrimazole-betamethasone (LOTRISONE) cream Apply 1 application topically 2 (two) times daily.     . Coenzyme Q10 (CO Q 10) 100 MG CAPS Take 100 mg by mouth daily.    Marisa [name] Kitchen docusate sodium (COLACE) 100 MG capsule Take 100 mg by mouth at bedtime.     . DULoxetine (CYMBALTA) 30 MG capsule Take 1 capsule (30 mg total) by mouth daily. 30 capsule 0  . estrogens, conjugated, (PREMARIN) 0.625 MG tablet Take 0.625 mg by mouth daily.     . feeding supplement, ENSURE COMPLETE, (ENSURE COMPLETE) LIQD Take 237 mLs by  mouth 2 (two) times daily between meals. 60 Bottle 0  . FeFum-FePoly-FA-B Cmp-C-Biot (INTEGRA PLUS) CAPS Take 1 capsule by mouth 2 (two) times daily. 60 capsule 6  . FREESTYLE LITE test strip TEST 10 TIMES DAILY ICD E11.65  10  . hydrocortisone (ANUSOL-HC) 25 MG suppository Insert one suppository daily as needed for hemorrhoids. 30 suppository 1  . insulin aspart (NOVOLOG) 100 UNIT/ML injection 0-9 Units, Subcutaneous, 3 times daily with meals CBG < 70: implement hypoglycemia protocol CBG 70 - 120: 0 units CBG 121 - 150: 1 unit CBG 151 - 200: 2 units CBG 201 - 250: 3 units CBG 251 - 300: 5 units CBG 301 - 350: 7 units CBG 351 - 400: 9 units CBG > 400: call MD 10 mL 11  . insulin detemir (LEVEMIR) 100 UNIT/ML injection Inject 0.1 mLs (10 Units total) into the skin daily at 12 noon. (Patient taking differently: Inject 20 Units into the skin daily at 12 noon. )    . ipratropium (ATROVENT) 0.06 % nasal spray Place 1 spray into both nostrils daily.  4  . leflunomide (ARAVA) 20 MG tablet Take 20 mg by mouth daily.    Marisa [name] Kitchen levalbuterol (XOPENEX HFA) 45 MCG/ACT inhaler Inhale 1-2 puffs into the lungs every 4 (four) hours as needed for wheezing (wheezing).     Marisa [name] Kitchen lisinopril (PRINIVIL,ZESTRIL) 10 MG tablet Take 10 mg by mouth daily.    Marisa [name] Kitchen LORazepam (ATIVAN) 0.5 MG tablet Take 1 tablet (0.5 mg total) by  mouth at bedtime. 20 tablet 0  . metoprolol succinate (TOPROL-XL) 100 MG 24 hr tablet Take 100 mg by mouth daily. Take with or immediately following a meal.    . Multiple Vitamin (MULTIVITAMINS PO) Take 1 tablet by mouth. Daily.     . naproxen sodium (ANAPROX) 220 MG tablet Take 220 mg by mouth daily as needed.    . predniSONE (DELTASONE) 10 MG tablet Take 10 mg by mouth daily with breakfast.    . predniSONE (DELTASONE) 5 MG tablet     . pregabalin (LYRICA) 75 MG capsule Take 75 mg by mouth daily.     . promethazine (PHENERGAN) 12.5 MG tablet Take 12.5 mg by mouth every 6 (six) hours as needed for nausea.     . risperiDONE (RISPERDAL) 0.5 MG tablet Take 0.5 tablets (0.25 mg total) by mouth 2 (two) times daily. 30 tablet 0  . traMADol (ULTRAM) 50 MG tablet Take 1 tablet (50 mg total) by mouth every 12 (twelve) hours as needed for moderate pain. 20 tablet 0  . vitamin C (ASCORBIC ACID) 500 MG tablet Take 500 mg by mouth 2 (two) times daily.    . Vitamin D-Vitamin K (VITAMIN K2-VITAMIN D3 PO) Take 1 tablet by mouth daily.     No current facility-administered medications for this visit.    Functional Status:  In your present state of health, do you have any difficulty performing the following activities: 03/14/2015 05/28/2014  Hearing? N N  Vision? N N  Difficulty concentrating or making decisions? N N  Walking or climbing stairs? Y Y  Dressing or bathing? N N  Doing errands, shopping? [name]  Preparing Food and eating ? Y -  Using the Toilet? N -  In the past six months, have you accidently leaked urine? N -  Do you have problems with loss of bowel control? N -  Managing your Medications? Y -  Managing your Finances? Y -  Housekeeping or managing your Housekeeping? Y -    Fall/Depression Screening: PHQ 2/9 Scores 03/08/2015 02/24/2015  PHQ - 2 Score 0 0   Fall Risk  02/24/2015 03/28/2014 10/06/2013  Falls in the past year? Yes No Yes  Number falls in past yr: 1 - 2 or more  Injury with Fall? (No Data) - -  Risk Factor Category  - - High Fall Risk  Risk for fall due to : Impaired balance/gait;History of fall(s) - History of fall(s);Impaired mobility  Follow up Education provided;Falls prevention discussed - -   Assessment:  THN CM Care Plan Problem One        Most Recent Value   Care Plan Problem One  No advance directives in place.   Role Documenting the Problem One  Care Management Telephonic Coordinator   Care Plan for Problem One  Active   THN Long Term Goal (31-90 days)  patient/spouse will review advance directive packet and completed advance directives within 90 days.    THN  Long Term Goal Start Date  03/14/15   Interventions for Problem One Long Term Goal  RN CM provided education to pt/spouse on advance care planning. RN CM will send information for pt./spouse to review and complete via mail.   THN CM Short Term Goal #1 (0-30 days)  Patient will discuss her EOL wishes and advance care planning with spouse and medical team within the next 30days.   THN CM Short Term Goal #1 Start Date  03/14/15   Interventions for Short Term Goal #1  RN CM reviewed with pt. importance of advance care planning and EOL decision making with family and medical team.    [name] CM Care Plan Problem Two        Most Recent Value   Care Plan Problem Two  knowledge deficit related to diabetes management as evidenced by hypoglycemic events   Role Documenting the Problem Two  Care Management Telephonic Coordinator   Care Plan for Problem Two  Active   THN CM Short Term Goal #1 (0-30 days)  patient will have no hypoglycemic events for the next 30 days.   THN CM Short Term Goal #1 Start Date  03/14/15   Interventions for Short Term Goal #2   RN CM provided verbal and written education to patient on diabetes management especially hypoglycemia     Plan:   RN CM will contact patient back within a month. RN CM will send barriers letter and assessment to PCP. RN CM will mail educational information to patient on advance care planning and management of diabetes/hypoglycemia.  Enzo Montgomery, RN,BSN,CCM [place] [facility] Telephonic Care Management Coordinator Direct Phone: [phone] Toll Free: [phone] Fax: [phone]

## 2015-03-14 NOTE — Telephone Encounter (Signed)
returned pt call and r/s missed appt....pt ok and aware of new d.t

## 2015-03-15 NOTE — Patient Outreach (Signed)
[facility]) Care Management  03/15/2015  Marisa [name] [birth year] [id number]   Telephone outreach to patient to assist in completing the extra help application. She was willing to do it over the phone.  We completed the application online and submitted it today.  She should hear from social security administration in the next 2 to 4 weeks.  She is going to call me back when she hears from them. I will follow up in 3 weeks.  Labrittany Wechter L. Mayar Whittier, [place] Care Management Assistant

## 2015-03-20 ENCOUNTER — Other Ambulatory Visit (HOSPITAL_BASED_OUTPATIENT_CLINIC_OR_DEPARTMENT_OTHER): Payer: Medicare Other

## 2015-03-20 ENCOUNTER — Telehealth: Payer: Self-pay | Admitting: Oncology

## 2015-03-20 ENCOUNTER — Ambulatory Visit (HOSPITAL_BASED_OUTPATIENT_CLINIC_OR_DEPARTMENT_OTHER): Payer: Medicare Other

## 2015-03-20 ENCOUNTER — Encounter: Payer: Self-pay | Admitting: Oncology

## 2015-03-20 ENCOUNTER — Ambulatory Visit (HOSPITAL_BASED_OUTPATIENT_CLINIC_OR_DEPARTMENT_OTHER): Payer: Medicare Other | Admitting: Oncology

## 2015-03-20 VITALS — BP 149/58 | HR 77 | Temp 98.1°F | Resp 18 | Ht <= 58 in | Wt 115.2 lb

## 2015-03-20 DIAGNOSIS — N183 Chronic kidney disease, stage 3 (moderate): Secondary | ICD-10-CM | POA: Diagnosis not present

## 2015-03-20 DIAGNOSIS — E611 Iron deficiency: Secondary | ICD-10-CM

## 2015-03-20 DIAGNOSIS — D649 Anemia, unspecified: Secondary | ICD-10-CM | POA: Diagnosis not present

## 2015-03-20 DIAGNOSIS — M069 Rheumatoid arthritis, unspecified: Secondary | ICD-10-CM

## 2015-03-20 LAB — CBC WITH DIFFERENTIAL/PLATELET
BASO%: 0.3 % (ref 0.0–2.0)
BASOS ABS: 0 10*3/uL (ref 0.0–0.1)
EOS ABS: 0.1 10*3/uL (ref 0.0–0.5)
EOS%: 1.1 % (ref 0.0–7.0)
HEMATOCRIT: 27.7 % — AB (ref 34.8–46.6)
HEMOGLOBIN: 9.1 g/dL — AB (ref 11.6–15.9)
LYMPH#: 0.3 10*3/uL — AB (ref 0.9–3.3)
LYMPH%: 3.1 % — ABNORMAL LOW (ref 14.0–49.7)
MCH: 28.6 pg (ref 25.1–34.0)
MCHC: 32.8 g/dL (ref 31.5–36.0)
MCV: 87.3 fL (ref 79.5–101.0)
MONO#: 0.5 10*3/uL (ref 0.1–0.9)
MONO%: 4.2 % (ref 0.0–14.0)
NEUT#: 10.1 10*3/uL — ABNORMAL HIGH (ref 1.5–6.5)
NEUT%: 91.3 % — AB (ref 38.4–76.8)
PLATELETS: 261 10*3/uL (ref 145–400)
RBC: 3.17 10*6/uL — ABNORMAL LOW (ref 3.70–5.45)
RDW: 13.3 % (ref 11.2–14.5)
WBC: 11.1 10*3/uL — ABNORMAL HIGH (ref 3.9–10.3)

## 2015-03-20 NOTE — Telephone Encounter (Signed)
Patient sent back to lab and given avs report and appointments for February and May 2017.

## 2015-03-20 NOTE — Progress Notes (Signed)
OFFICE PROGRESS NOTE   March 21, 2015   Physicians: Jani Gravel, M.Wynell Balloon, ([place])  INTERVAL HISTORY:  Patient is seen, together with husband, in continuing follow up of multifactorial anemia which has been worsened related to intermittent GI bleeding. Counts have been stable over last several months. She was last transfused PRBCs in 03-2014 and last feraheme a year ago in 10-2013. Labs 02-2013 had erythropoietin level 104, B12 normal and folate normal. Iron and ferritin available after visit today have serum iron 98, %sat 41 and ferritin 93.  Patient has not had known GI bleeding recently. Multiple chronic problems unchanged, particularly rheumatoid arthritis for which she has been on steroids for years, need for continuous home O2, diabetes on insulin and chronic kidney disease. Breathing seems at baseline. She has generalized pain from arthritis, rates as 4/10 now.  Review of systems as above, also: No revent fever or symptoms of infection. No LE swelling. Appetite poor without nausea or other specific symptoms. Bowels are moving as usual.  Remainder of 10 point Review of Systems negative.  Objective:  Vital signs in last 24 hours:  BP 149/58 mmHg  Pulse 77  Temp(Src) 98.1 F (36.7 C) (Oral)  Resp 18  Ht 4\' 9"  (1.448 m)  Wt 115 lb 3.2 oz (52.254 kg)  BMI 24.92 kg/m2  SpO2 99% Weight up 1 lbs. Frail lady, looks older than stated age Alert, oriented and appropriate, very pleasant as always. Ambulatory with rolling walker and portable O2    HEENT:PERRL, sclerae not icteric. Oral mucosa moist without lesions, posterior pharynx clear.  Neck supple. No JVD.  Lymphatics:no cervical,supraclavicular adenopathy Resp: clear to auscultation bilaterally Cardio: regular rate and rhythm. No gallop. GI: soft, nontender including epigastrium, not distended, no mass or organomegaly. Normally active bowel sounds.  Musculoskeletal/ Extremities: without pitting edema, cords,  tenderness. Diminished muscle mass. Arthritic changes hands bilaterally, splint on right wrist. Neuro: speech fluent, no focal deficits. Skin without rash. Scattered ecchymoses extremities, skin thin   Lab Results:  Results for orders placed or performed in visit on 03/20/15  CBC with Differential  Result Value Ref Range   WBC 11.1 (H) 3.9 - 10.3 10e3/uL   NEUT# 10.1 (H) 1.5 - 6.5 10e3/uL   HGB 9.1 (L) 11.6 - 15.9 g/dL   HCT 27.7 (L) 34.8 - 46.6 %   Platelets 261 145 - 400 10e3/uL   MCV 87.3 79.5 - 101.0 fL   MCH 28.6 25.1 - 34.0 pg   MCHC 32.8 31.5 - 36.0 g/dL   RBC 3.17 (L) 3.70 - 5.45 10e6/uL   RDW 13.3 11.2 - 14.5 %   lymph# 0.3 (L) 0.9 - 3.3 10e3/uL   MONO# 0.5 0.1 - 0.9 10e3/uL   Eosinophils Absolute 0.1 0.0 - 0.5 10e3/uL   Basophils Absolute 0.0 0.0 - 0.1 10e3/uL   NEUT% 91.3 (H) 38.4 - 76.8 %   LYMPH% 3.1 (L) 14.0 - 49.7 %   MONO% 4.2 0.0 - 14.0 %   EOS% 1.1 0.0 - 7.0 %   BASO% 0.3 0.0 - 2.0 %     Studies/Results:  No results found.  Medications: I have reviewed the patient's current medications. Continue Integra Plus 1-2x daily  DISCUSSION: Hemoglobin is down slightly today and she has no active bleeding. She reports fatigue but no other symptoms related to anemia. Iron studies are pending. We will plan to call the patient in approximate 2 weeks to follow up and be sure she is not more symptomatic. For now,  I have requested a repeat CBC in about 2 months. Follow-up in 4-5 months.  Assessment/Plan: 1.Anemia: previously iron deficient likely from hemorrhoidal bleeding x months, multiple blood draws x years and inadequate oral absorption of iron, also may have element of anemia of chronic disease and/ or Arava. Iron studies some better, continue po iron. WIll follow CBC every 2-3 months and I will see her with labs in ~ 4-5 months. 2.rheumatoid arthritis x 20 years, on long term daily prednisone followed by Dr Amil Amen.  3. Atrophic gastritis, hx adenomatous colon  polyps, bleeding hemorrhoids: followed by Dr Fuller Plan.  4.diabetes on insulin and metformin  5.history of esophageal stricture  6.asthma: on continuous O2 and home nebulizers  7.hx perirectal abscess, resolved with I&D  8.post cholecystectomy and abdominal hysterectomy  9.HTN, hx nonSTEMI  10.hx pericarditis  11.scoliosis and degenerative arthritis in spine + RA.  12.hx single seizure possibly related to DM  13 chronic kidney disease stage 3 14. Deconditioning related to chronic illness 15.chronic pain related to multiple problems 16.acute encephalopathy related to polypharmacy and hypoglycemia 12-2013.   Plan reviewed with Dr. Marko Plume.  Time spent 15 min including >50% counseling and coordination of care. Cc this note to Drs Maudie Mercury and Bing Quarry, Erasmo Downer, NP   03/21/2015, 9:55 AM

## 2015-03-21 ENCOUNTER — Telehealth: Payer: Self-pay

## 2015-03-21 LAB — IRON AND TIBC
%SAT: 42 % (ref 21–57)
Iron: 98 ug/dL (ref 41–142)
TIBC: 236 ug/dL (ref 236–444)
UIBC: 138 ug/dL (ref 120–384)

## 2015-03-21 LAB — FERRITIN: Ferritin: 143 ng/ml (ref 9–269)

## 2015-03-21 NOTE — Telephone Encounter (Signed)
-----   Message from Maryanna Shape, NP sent at 03/21/2015 11:18 AM EST ----- Regarding: labs Barbaraann Share  Can you call Mrs. Tarpey and let her know that her iron studies are normal. She can continue to take the Integra. Does not need IV iron at this time.   I talked to Dr. Marko Plume yesterday about her and she asked for you to call the patient back in about 2 weeks to see how she feels an be sure that she is not more symptomatic form her anemia since her Hgb is drifting down.  Thanks [employer]

## 2015-03-21 NOTE — Telephone Encounter (Signed)
Told Ms. Kuehnle the results of the iron studies as noted below by Mikey Bussing, NP. Will follow up with Ms. Gaillard ~04-04-15 as noted below by Mikey Bussing, NP.

## 2015-04-04 ENCOUNTER — Other Ambulatory Visit: Payer: Self-pay

## 2015-04-04 NOTE — Patient Outreach (Signed)
[facility]) Care Management  04/04/2015  Marisa [name] [birth year] [id number]   Telephone outreach to patient to check the status of the extra help application submitted. Patient and spouse stated they received decision letter from social security and it was denied due to being over the income limits. I am happy to assist in any future pharmacy assistance needs. I am removing myself from the care team.  Tyesha Joffe L. Doria Fern, [place] Care Management Assistant

## 2015-04-04 NOTE — Patient Outreach (Signed)
[facility]) Care Management  04/04/2015  Marisa [name] [birth year] [id number]  Care Coordination  Outreach call to patient returning her call. She had tried to reach RN CM. She states that she wanted RN CM to talk with her spouse as he had some questions/concerns. Spouse inquiring about what to do to help patient's mental status. He states that for the past few months he has noticed that patient is getting more forgetful and having both short and long term issues. He denies any factors that may have contributed to this such as change in meds, hypoglycemic events, etc. Discussed possible causes with spouse and advised of the need to alert MD. Spouse reports that patient has appt with Dr. Maudie Mercury on this week (04/06/15). Instructed spouse to discuss these symptoms and changes in patient's condition during this upcoming visit and ask for further eval and guidance. Spouse voiced understanding. Patient reported she was doing fine otherwise and had no other needs or concerns at this time.  Plan: RN CM will contact patient/spouse within a week to follow up on MD appt.  Enzo Montgomery, RN,BSN,CCM [place] Management Telephonic Care Management Coordinator Direct Phone: [phone] Toll Free: [phone] Fax: [phone]

## 2015-04-06 ENCOUNTER — Ambulatory Visit: Payer: Self-pay

## 2015-04-06 ENCOUNTER — Telehealth: Payer: Self-pay

## 2015-04-06 DIAGNOSIS — E119 Type 2 diabetes mellitus without complications: Secondary | ICD-10-CM | POA: Diagnosis not present

## 2015-04-06 DIAGNOSIS — I10 Essential (primary) hypertension: Secondary | ICD-10-CM | POA: Diagnosis not present

## 2015-04-06 DIAGNOSIS — M059 Rheumatoid arthritis with rheumatoid factor, unspecified: Secondary | ICD-10-CM | POA: Diagnosis not present

## 2015-04-06 DIAGNOSIS — G4700 Insomnia, unspecified: Secondary | ICD-10-CM | POA: Diagnosis not present

## 2015-04-06 NOTE — Telephone Encounter (Signed)
Spoke with Marisa [name] and she stated that she is feeling very tired . Denies SOB.  She will come to the [facility] tomorrow at 1015 for lab to see if she needs a blood transfusion.

## 2015-04-06 NOTE — Telephone Encounter (Signed)
-----   Message from Maryanna Shape, NP sent at 03/21/2015 11:18 AM EST ----- Regarding: labs Barbaraann Share  Can you call Mrs. Cheff and let her know that her iron studies are normal. She can continue to take the Integra. Does not need IV iron at this time.   I talked to Dr. Marko Plume yesterday about her and she asked for you to call the patient back in about 2 weeks to see how she feels an be sure that she is not more symptomatic form her anemia since her Hgb is drifting down.  Thanks [employer]

## 2015-04-07 ENCOUNTER — Emergency Department (HOSPITAL_COMMUNITY): Payer: Medicare Other

## 2015-04-07 ENCOUNTER — Other Ambulatory Visit: Payer: Self-pay

## 2015-04-07 ENCOUNTER — Other Ambulatory Visit: Payer: Self-pay | Admitting: *Deleted

## 2015-04-07 ENCOUNTER — Other Ambulatory Visit: Payer: Self-pay | Admitting: Oncology

## 2015-04-07 ENCOUNTER — Ambulatory Visit: Payer: Self-pay

## 2015-04-07 ENCOUNTER — Other Ambulatory Visit: Payer: Self-pay | Admitting: Pharmacist

## 2015-04-07 ENCOUNTER — Other Ambulatory Visit (HOSPITAL_BASED_OUTPATIENT_CLINIC_OR_DEPARTMENT_OTHER): Payer: Medicare Other

## 2015-04-07 ENCOUNTER — Ambulatory Visit (HOSPITAL_COMMUNITY)
Admission: RE | Admit: 2015-04-07 | Discharge: 2015-04-07 | Disposition: A | Discharge status: Home / Self Care | Payer: Medicare Other | Source: Ambulatory Visit | Attending: Oncology | Admitting: Oncology

## 2015-04-07 ENCOUNTER — Ambulatory Visit (HOSPITAL_COMMUNITY): Payer: Medicare Other

## 2015-04-07 ENCOUNTER — Observation Stay (HOSPITAL_COMMUNITY)
Admission: EM | Admit: 2015-04-07 | Discharge: 2015-04-08 | Disposition: A | Discharge status: Home / Self Care | Payer: Medicare Other | Attending: Internal Medicine | Admitting: Internal Medicine

## 2015-04-07 ENCOUNTER — Encounter (HOSPITAL_COMMUNITY): Payer: Self-pay | Admitting: Emergency Medicine

## 2015-04-07 ENCOUNTER — Ambulatory Visit: Payer: Medicare Other

## 2015-04-07 DIAGNOSIS — M1990 Unspecified osteoarthritis, unspecified site: Secondary | ICD-10-CM | POA: Insufficient documentation

## 2015-04-07 DIAGNOSIS — F419 Anxiety disorder, unspecified: Secondary | ICD-10-CM | POA: Insufficient documentation

## 2015-04-07 DIAGNOSIS — Z79899 Other long term (current) drug therapy: Secondary | ICD-10-CM | POA: Insufficient documentation

## 2015-04-07 DIAGNOSIS — J45909 Unspecified asthma, uncomplicated: Secondary | ICD-10-CM | POA: Diagnosis not present

## 2015-04-07 DIAGNOSIS — D649 Anemia, unspecified: Secondary | ICD-10-CM

## 2015-04-07 DIAGNOSIS — D500 Iron deficiency anemia secondary to blood loss (chronic): Secondary | ICD-10-CM

## 2015-04-07 DIAGNOSIS — Z9981 Dependence on supplemental oxygen: Secondary | ICD-10-CM | POA: Diagnosis not present

## 2015-04-07 DIAGNOSIS — F329 Major depressive disorder, single episode, unspecified: Secondary | ICD-10-CM | POA: Diagnosis not present

## 2015-04-07 DIAGNOSIS — M069 Rheumatoid arthritis, unspecified: Secondary | ICD-10-CM | POA: Insufficient documentation

## 2015-04-07 DIAGNOSIS — R079 Chest pain, unspecified: Secondary | ICD-10-CM | POA: Diagnosis not present

## 2015-04-07 DIAGNOSIS — I491 Atrial premature depolarization: Secondary | ICD-10-CM | POA: Diagnosis not present

## 2015-04-07 DIAGNOSIS — I10 Essential (primary) hypertension: Secondary | ICD-10-CM | POA: Diagnosis not present

## 2015-04-07 DIAGNOSIS — Z794 Long term (current) use of insulin: Secondary | ICD-10-CM | POA: Insufficient documentation

## 2015-04-07 DIAGNOSIS — I252 Old myocardial infarction: Secondary | ICD-10-CM | POA: Diagnosis not present

## 2015-04-07 DIAGNOSIS — R0602 Shortness of breath: Secondary | ICD-10-CM | POA: Diagnosis not present

## 2015-04-07 DIAGNOSIS — Z9049 Acquired absence of other specified parts of digestive tract: Secondary | ICD-10-CM | POA: Insufficient documentation

## 2015-04-07 DIAGNOSIS — F411 Generalized anxiety disorder: Secondary | ICD-10-CM | POA: Diagnosis present

## 2015-04-07 DIAGNOSIS — R0789 Other chest pain: Secondary | ICD-10-CM | POA: Diagnosis not present

## 2015-04-07 DIAGNOSIS — E119 Type 2 diabetes mellitus without complications: Secondary | ICD-10-CM | POA: Diagnosis not present

## 2015-04-07 DIAGNOSIS — R000 Tachycardia, unspecified: Secondary | ICD-10-CM | POA: Insufficient documentation

## 2015-04-07 DIAGNOSIS — K219 Gastro-esophageal reflux disease without esophagitis: Secondary | ICD-10-CM | POA: Diagnosis not present

## 2015-04-07 DIAGNOSIS — I129 Hypertensive chronic kidney disease with stage 1 through stage 4 chronic kidney disease, or unspecified chronic kidney disease: Secondary | ICD-10-CM | POA: Diagnosis not present

## 2015-04-07 DIAGNOSIS — Z7952 Long term (current) use of systemic steroids: Secondary | ICD-10-CM | POA: Diagnosis not present

## 2015-04-07 LAB — PROTIME-INR
INR: 0.94 (ref 0.00–1.49)
PROTHROMBIN TIME: 12.8 s (ref 11.6–15.2)

## 2015-04-07 LAB — COMPREHENSIVE METABOLIC PANEL
ALT: 17 U/L (ref 14–54)
AST: 21 U/L (ref 15–41)
Albumin: 3.2 g/dL — ABNORMAL LOW (ref 3.5–5.0)
Alkaline Phosphatase: 263 U/L — ABNORMAL HIGH (ref 38–126)
Anion gap: 11 (ref 5–15)
BUN: 20 mg/dL (ref 6–20)
CHLORIDE: 94 mmol/L — AB (ref 101–111)
CO2: 28 mmol/L (ref 22–32)
CREATININE: 1.33 mg/dL — AB (ref 0.44–1.00)
Calcium: 8.5 mg/dL — ABNORMAL LOW (ref 8.9–10.3)
GFR calc non Af Amer: 38 mL/min — ABNORMAL LOW (ref >60)
GFR, EST AFRICAN AMERICAN: 44 mL/min — AB (ref >60)
Glucose, Bld: 197 mg/dL — ABNORMAL HIGH (ref 65–99)
Potassium: 5 mmol/L (ref 3.5–5.1)
SODIUM: 133 mmol/L — AB (ref 135–145)
Total Bilirubin: 0.4 mg/dL (ref 0.3–1.2)
Total Protein: 6.3 g/dL — ABNORMAL LOW (ref 6.5–8.1)

## 2015-04-07 LAB — MAGNESIUM: Magnesium: 2.3 mg/dL (ref 1.7–2.4)

## 2015-04-07 LAB — CBC
HCT: 25.6 % — ABNORMAL LOW (ref 36.0–46.0)
Hemoglobin: 8.4 g/dL — ABNORMAL LOW (ref 12.0–15.0)
MCH: 29 pg (ref 26.0–34.0)
MCHC: 32.8 g/dL (ref 30.0–36.0)
MCV: 88.3 fL (ref 78.0–100.0)
PLATELETS: 308 10*3/uL (ref 150–400)
RBC: 2.9 MIL/uL — AB (ref 3.87–5.11)
RDW: 13.1 % (ref 11.5–15.5)
WBC: 10 10*3/uL (ref 4.0–10.5)

## 2015-04-07 LAB — CBC WITH DIFFERENTIAL/PLATELET
BASO%: 0.3 % (ref 0.0–2.0)
Basophils Absolute: 0 10*3/uL (ref 0.0–0.1)
EOS%: 2.4 % (ref 0.0–7.0)
Eosinophils Absolute: 0.2 10*3/uL (ref 0.0–0.5)
HCT: 26 % — ABNORMAL LOW (ref 34.8–46.6)
HEMOGLOBIN: 8.4 g/dL — AB (ref 11.6–15.9)
LYMPH%: 4.8 % — AB (ref 14.0–49.7)
MCH: 28.9 pg (ref 25.1–34.0)
MCHC: 32.3 g/dL (ref 31.5–36.0)
MCV: 89.3 fL (ref 79.5–101.0)
MONO#: 0.8 10*3/uL (ref 0.1–0.9)
MONO%: 9.4 % (ref 0.0–14.0)
NEUT%: 83.1 % — ABNORMAL HIGH (ref 38.4–76.8)
NEUTROS ABS: 7.2 10*3/uL — AB (ref 1.5–6.5)
Platelets: 288 10*3/uL (ref 145–400)
RBC: 2.91 10*6/uL — AB (ref 3.70–5.45)
RDW: 13.2 % (ref 11.2–14.5)
WBC: 8.6 10*3/uL (ref 3.9–10.3)
lymph#: 0.4 10*3/uL — ABNORMAL LOW (ref 0.9–3.3)

## 2015-04-07 LAB — PREPARE RBC (CROSSMATCH)

## 2015-04-07 LAB — GLUCOSE, CAPILLARY
GLUCOSE-CAPILLARY: 408 mg/dL — AB (ref 65–99)
Glucose-Capillary: 198 mg/dL — ABNORMAL HIGH (ref 65–99)

## 2015-04-07 LAB — TROPONIN I: Troponin I: <0.03 ng/mL (ref <0.031)

## 2015-04-07 LAB — HOLD TUBE, BLOOD BANK

## 2015-04-07 MED ORDER — ALBUTEROL SULFATE (2.5 MG/3ML) 0.083% IN NEBU: 2.5000 mg | INHALATION_SOLUTION | Freq: Four times a day (QID) | RESPIRATORY_TRACT | Status: DC | PRN

## 2015-04-07 MED ORDER — INSULIN ASPART 100 UNIT/ML ~~LOC~~ SOLN
0.0000 [IU] | Freq: Three times a day (TID) | SUBCUTANEOUS | Status: DC
  Administered 2015-04-07 – 2015-04-08 (×2): 15 [IU] via SUBCUTANEOUS

## 2015-04-07 MED ORDER — PANTOPRAZOLE SODIUM 40 MG PO TBEC
40.0000 mg | DELAYED_RELEASE_TABLET | Freq: Every day | ORAL | Status: DC
  Administered 2015-04-08: 40 mg via ORAL
  Filled 2015-04-07: qty 1

## 2015-04-07 MED ORDER — TRAMADOL HCL 50 MG PO TABS
50.0000 mg | ORAL_TABLET | Freq: Two times a day (BID) | ORAL | Status: DC | PRN
  Administered 2015-04-08: 50 mg via ORAL
  Filled 2015-04-07: qty 1

## 2015-04-07 MED ORDER — MORPHINE SULFATE (PF) 2 MG/ML IV SOLN
2.0000 mg | INTRAVENOUS | Status: DC | PRN
  Administered 2015-04-08 (×2): 2 mg via INTRAVENOUS
  Filled 2015-04-07 (×2): qty 1

## 2015-04-07 MED ORDER — PREDNISONE 5 MG PO TABS
10.0000 mg | ORAL_TABLET | Freq: Every day | ORAL | Status: DC
  Administered 2015-04-08: 10 mg via ORAL
  Filled 2015-04-07: qty 2

## 2015-04-07 MED ORDER — SODIUM CHLORIDE 0.9 % IJ SOLN
3.0000 mL | Freq: Two times a day (BID) | INTRAMUSCULAR | Status: DC
  Administered 2015-04-07 – 2015-04-08 (×2): 3 mL via INTRAVENOUS

## 2015-04-07 MED ORDER — FLUTICASONE PROPIONATE 50 MCG/ACT NA SUSP
2.0000 | Freq: Every day | NASAL | Status: DC | PRN
  Filled 2015-04-07: qty 16

## 2015-04-07 MED ORDER — NITROGLYCERIN 0.4 MG SL SUBL: 0.4000 mg | SUBLINGUAL_TABLET | SUBLINGUAL | Status: DC | PRN

## 2015-04-07 MED ORDER — ACETAMINOPHEN 650 MG RE SUPP: 650.0000 mg | Freq: Four times a day (QID) | RECTAL | Status: DC | PRN

## 2015-04-07 MED ORDER — ACETAMINOPHEN 325 MG PO TABS
650.0000 mg | ORAL_TABLET | Freq: Four times a day (QID) | ORAL | Status: DC | PRN
  Administered 2015-04-08: 650 mg via ORAL
  Filled 2015-04-07: qty 2

## 2015-04-07 MED ORDER — HYDROCORTISONE ACETATE 25 MG RE SUPP
25.0000 mg | Freq: Two times a day (BID) | RECTAL | Status: DC | PRN
  Filled 2015-04-07: qty 1

## 2015-04-07 MED ORDER — PREGABALIN 75 MG PO CAPS
75.0000 mg | ORAL_CAPSULE | Freq: Every day | ORAL | Status: DC
  Administered 2015-04-08: 75 mg via ORAL
  Filled 2015-04-07: qty 1

## 2015-04-07 MED ORDER — ASPIRIN 81 MG PO CHEW
324.0000 mg | CHEWABLE_TABLET | Freq: Once | ORAL | Status: AC
  Administered 2015-04-07: 324 mg via ORAL
  Filled 2015-04-07: qty 4

## 2015-04-07 MED ORDER — CO Q 10 100 MG PO CAPS: 100.0000 mg | ORAL_CAPSULE | Freq: Every day | ORAL | Status: DC

## 2015-04-07 MED ORDER — RISPERIDONE 0.25 MG PO TABS
0.2500 mg | ORAL_TABLET | Freq: Two times a day (BID) | ORAL | Status: DC
  Administered 2015-04-07 – 2015-04-08 (×2): 0.25 mg via ORAL
  Filled 2015-04-07 (×2): qty 1

## 2015-04-07 MED ORDER — LORAZEPAM 0.5 MG PO TABS
0.5000 mg | ORAL_TABLET | Freq: Every day | ORAL | Status: DC
  Administered 2015-04-07: 0.5 mg via ORAL
  Filled 2015-04-07: qty 1

## 2015-04-07 MED ORDER — SODIUM CHLORIDE 0.9 % IV SOLN
10.0000 mL/h | Freq: Once | INTRAVENOUS | Status: AC
  Administered 2015-04-07: 10 mL/h via INTRAVENOUS

## 2015-04-07 MED ORDER — METOPROLOL SUCCINATE ER 100 MG PO TB24
100.0000 mg | ORAL_TABLET | Freq: Every day | ORAL | Status: DC
  Administered 2015-04-08: 100 mg via ORAL
  Filled 2015-04-07: qty 1

## 2015-04-07 MED ORDER — INSULIN DETEMIR 100 UNIT/ML ~~LOC~~ SOLN
18.0000 [IU] | Freq: Every day | SUBCUTANEOUS | Status: DC
  Administered 2015-04-08: 18 [IU] via SUBCUTANEOUS
  Filled 2015-04-07: qty 0.18

## 2015-04-07 MED ORDER — ALBUTEROL SULFATE (2.5 MG/3ML) 0.083% IN NEBU: 3.0000 mL | INHALATION_SOLUTION | RESPIRATORY_TRACT | Status: DC | PRN

## 2015-04-07 NOTE — ED Notes (Signed)
RN obtaining labs 

## 2015-04-07 NOTE — ED Provider Notes (Signed)
CSN: [id number]     Arrival date & time 04/07/15  1159 History   First MD Initiated Contact with Patient 04/07/15 1209     Chief Complaint  Patient presents with  . Chest Pain     (Consider location/radiation/quality/duration/timing/severity/associated sxs/prior Treatment) HPI Patient presents with concern of chest pain. Patient states the pain has been present for at least one week, has been largely unchanged. The pain is sternal, epigastric, sore, moderate. Today, the patient was at the oncology center for a previously scheduled blood transfusion. With complains of chest pain she was sent here for evaluation. She denies any new dyspnea, lightheadedness, syncope, nausea, vomiting. Patient's history is notable for recurrent anemia, unclear etiology, presumed GI. Patient specifically denies any ongoing melanoma, rectal bleeding, hematemesis, rectal pain. Past Medical History  Diagnosis Date  . Diabetic ketoacidosis ([place]) 11/2010  . Myocardial infarction [facility])     Chest pain s/p normal cath in 08/2004 then NSTEMI during 11/2010 admission, negative Myoview  . Diabetes mellitus type 2, insulin dependent ([place])     initial diagnoses 11/2008  . GERD (gastroesophageal reflux disease)   . Esophageal stricture 06/2004    Dilation 06/2004  . Supraventricular tachycardia ([place])   . history of  pericarditis 12/2002  . Pericardial effusion   . Rheumatoid arthritis(714.0)     On MTX and chronic steroids  . Osteoarthritis   . Asthma     Home 3L O2   . Seizure ([place]) 11/2008  . Rectal bleeding 11/2010    Large hemorrhoids  . Chronic anemia   . History of blood transfusion   . Diverticulosis   . Anemia   . Family history of anesthesia complication     DAUGHTER HAS NAUSEA  . Asthma   . Hypertension   . Diabetes mellitus without complication ([place])   . Arthritis   . Rheumatoid arteritis   . Depression     patient feels depressed at the end of the month  . Oxygen deficiency    Past Surgical  History  Procedure Laterality Date  . Cholecystectomy    . Abdominal hysterectomy    . Incise and drain abcess  09/2011    I&D of peri-rectal abcess per Dr Zella Richer.   . Colonoscopy N/A 01/06/2013    Procedure: COLONOSCOPY;  Surgeon: Ladene Artist, MD;  Location[facility] ENDOSCOPY;  Service: Endoscopy;  Laterality: N/A;  . Esophagogastroduodenoscopy N/A 02/11/2013    Procedure: ESOPHAGOGASTRODUODENOSCOPY (EGD);  Surgeon: Irene Shipper, MD;  Location: [facility] ENDOSCOPY;  Service: Endoscopy;  Laterality: N/A;   Family History  Problem Relation Age of Onset  . Heart disease Mother   . Stroke Father    Social History  Substance Use Topics  . Smoking status: Never Smoker   . Smokeless tobacco: None  . Alcohol Use: No   OB History    Gravida Para Term Preterm AB TAB SAB Ectopic Multiple Living   0 0 0 0 0 0 0 0       Review of Systems  Constitutional:       Per HPI, otherwise negative  HENT:       Per HPI, otherwise negative  Respiratory:       Per HPI, otherwise negative  Cardiovascular:       Per HPI, otherwise negative  Gastrointestinal: Negative for vomiting.  Endocrine:       Negative aside from HPI  Genitourinary:       Neg aside from HPI   Musculoskeletal:  Per HPI, otherwise negative  Skin: Negative.   Neurological: Negative for syncope.      Allergies  Demerol; Penicillins; Enbrel; Humira; Demerol; and Penicillins  Home Medications   Prior to Admission medications   Medication Sig Start Date End Date Taking? Authorizing Provider  albuterol (PROVENTIL) (5 MG/ML) 0.5% nebulizer solution Take 2.5 mg by nebulization every 6 (six) hours as needed for wheezing or shortness of breath (shortness of breath).  10/08/11  Yes Nishant Dhungel, MD  Coenzyme Q10 (CO Q 10) 100 MG CAPS Take 100 mg by mouth daily.   Yes Historical Provider, MD  docusate sodium (COLACE) 100 MG capsule Take 200 mg by mouth daily as needed for mild constipation.    Yes Historical Provider, MD   estrogens, conjugated, (PREMARIN) 0.625 MG tablet Take 0.625 mg by mouth daily.    Yes Historical Provider, MD  FeFum-FePoly-FA-B Cmp-C-Biot (INTEGRA PLUS) CAPS Take 1 capsule by mouth 2 (two) times daily. Patient taking differently: Take 1 capsule by mouth daily.  03/06/15  Yes Lennis Marion Downer, MD  fluticasone (FLONASE) 50 MCG/ACT nasal spray Place 2 sprays into both nostrils daily as needed for allergies or rhinitis.   Yes Historical Provider, MD  insulin aspart (NOVOLOG) 100 UNIT/ML injection 0-9 Units, Subcutaneous, 3 times daily with meals CBG < 70: implement hypoglycemia protocol CBG 70 - 120: 0 units CBG 121 - 150: 1 unit CBG 151 - 200: 2 units CBG 201 - 250: 3 units CBG 251 - 300: 5 units CBG 301 - 350: 7 units CBG 351 - 400: 9 units CBG > 400: call MD 01/11/14  Yes Shanker Kristeen Mans, MD  insulin detemir (LEVEMIR) 100 UNIT/ML injection Inject 0.1 mLs (10 Units total) into the skin daily at 12 noon. Patient taking differently: Inject 18 Units into the skin daily at 12 noon.  01/12/14  Yes Verlee Monte, MD  ipratropium (ATROVENT) 0.06 % nasal spray Place 1 spray into both nostrils daily as needed for rhinitis.  04/24/14  Yes Historical Provider, MD  leflunomide (ARAVA) 20 MG tablet Take 20 mg by mouth daily.   Yes Historical Provider, MD  levalbuterol (XOPENEX HFA) 45 MCG/ACT inhaler Inhale 1-2 puffs into the lungs every 4 (four) hours as needed for wheezing (wheezing).    Yes Historical Provider, MD  lisinopril (PRINIVIL,ZESTRIL) 20 MG tablet Take 30 mg by mouth daily.   Yes Historical Provider, MD  LORazepam (ATIVAN) 1 MG tablet Take 1 mg by mouth 3 (three) times daily as needed for anxiety.  03/22/15  Yes Historical Provider, MD  Menthol, Topical Analgesic, (ICY HOT BACK) 5 % PADS Apply 1 patch topically daily as needed (joint pain).   Yes Historical Provider, MD  metoprolol succinate (TOPROL-XL) 100 MG 24 hr tablet Take 100 mg by mouth daily. Take with or immediately following a meal.    Yes Historical Provider, MD  Multiple Vitamin (MULTIVITAMINS PO) Take 1 tablet by mouth. Daily.    Yes Historical Provider, MD  naproxen sodium (ANAPROX) 220 MG tablet Take 220 mg by mouth daily as needed (pain).    Yes Historical Provider, MD  omeprazole (PRILOSEC) 20 MG capsule Take 20 mg by mouth daily. 04/06/15  Yes Historical Provider, MD  predniSONE (DELTASONE) 10 MG tablet Take 10 mg by mouth daily with breakfast.   Yes Historical Provider, MD  pregabalin (LYRICA) 75 MG capsule Take 75 mg by mouth daily.    Yes Historical Provider, MD  promethazine (PHENERGAN) 12.5 MG tablet Take 12.5 mg by  mouth every 6 (six) hours as needed for nausea.   Yes Historical Provider, MD  risperiDONE (RISPERDAL) 0.5 MG tablet Take 0.5 tablets (0.25 mg total) by mouth 2 (two) times daily. 01/11/14  Yes Shanker Kristeen Mans, MD  traMADol (ULTRAM) 50 MG tablet Take 1 tablet (50 mg total) by mouth every 12 (twelve) hours as needed for moderate pain. 02/25/15  Yes Melynda Ripple, MD  vitamin C (ASCORBIC ACID) 500 MG tablet Take 500 mg by mouth 2 (two) times daily.   Yes Historical Provider, MD  Vitamin D-Vitamin K (VITAMIN K2-VITAMIN D3 PO) Take 1 tablet by mouth daily.   Yes Historical Provider, MD  AMBULATORY NON FORMULARY MEDICATION Continuous O2 @@ 2.5-3 LMP    Historical Provider, MD  BD PEN NEEDLE NANO U/F 32G X 4 MM MISC  10/13/14   Historical Provider, MD  FREESTYLE LITE test strip TEST 10 TIMES DAILY ICD E11.65 09/16/14   Historical Provider, MD  hydrocortisone (ANUSOL-HC) 25 MG suppository Insert one suppository daily as needed for hemorrhoids. 08/26/13   Lennis Marion Downer, MD  LORazepam (ATIVAN) 0.5 MG tablet Take 1 tablet (0.5 mg total) by mouth at bedtime. 01/11/14   Shanker Kristeen Mans, MD   BP 169/94 mmHg  Pulse 83  Temp(Src) 97.9 F (36.6 C) (Oral)  Resp 20  SpO2 100% Physical Exam  Constitutional: She is oriented to person, place, and time. She appears well-developed and well-nourished. No distress.   HENT:  Head: Normocephalic and atraumatic.  Eyes: Conjunctivae and EOM are normal.  Cardiovascular: Normal rate.  An irregularly irregular rhythm present.  Pulmonary/Chest: Effort normal and breath sounds normal. No stridor. No respiratory distress.  Abdominal: She exhibits no distension. There is no tenderness.  Musculoskeletal: She exhibits no edema.  Neurological: She is alert and oriented to person, place, and time. No cranial nerve deficit.  Skin: Skin is warm and dry.  Psychiatric: She has a normal mood and affect.  Nursing note and vitals reviewed.   ED Course  Procedures (including critical care time) Labs Review Labs Reviewed  CBC - Abnormal; Notable for the following:    RBC 2.90 (*)    Hemoglobin 8.4 (*)    HCT 25.6 (*)    All other components within normal limits  COMPREHENSIVE METABOLIC PANEL - Abnormal; Notable for the following:    Sodium 133 (*)    Chloride 94 (*)    Glucose, Bld 197 (*)    Creatinine, Ser 1.33 (*)    Calcium 8.5 (*)    Total Protein 6.3 (*)    Albumin 3.2 (*)    Alkaline Phosphatase 263 (*)    GFR calc non Af Amer 38 (*)    GFR calc Af Amer 44 (*)    All other components within normal limits  MAGNESIUM  TROPONIN I  PROTIME-INR  PREPARE RBC (CROSSMATCH)    Imaging Review Dg Chest 2 View  04/07/2015  CLINICAL DATA:  74 year old with 2-3 week history of constant mid chest pain and shortness of breath. Patient was sent to the emergency department from the [facility] were she was receiving a transfusion for chronic multifactorial anemia. Oxygen dependent patient with rheumatoid arthritis. Chronic kidney disease. EXAM: CHEST  2 VIEW COMPARISON:  01/04/2014 and earlier. FINDINGS: Cardiac silhouette mildly enlarged for AP semi-erect technique, unchanged. Thoracic aorta atherosclerotic, unchanged. Hilar and mediastinal contours otherwise unremarkable. Stable chronic elevation of the left hemidiaphragm. Stable hyperinflation. Lungs clear.  Bronchovascular markings normal. Pulmonary vascularity normal. No visible pleural effusions.  No pneumothorax. Degenerative changes throughout the thoracic spine. Generalized osseous demineralization. IMPRESSION: Stable hyperinflation consistent with COPD and/or asthma. Stable mild cardiomegaly. No acute cardiopulmonary disease. Electronically Signed   By: Evangeline Dakin M.D.   On: 04/07/2015 12:45   I have personally reviewed and evaluated these images and lab results as part of my medical decision-making.  EKG with sinus tachycardia, rate 116, PA-C, abnormal  On cardiac monitor the patient actually has intermittent sinus rhythm, changing to atrial fibrillation.  Pulse ox symmetry 99% room air normal  Chart review notable for multiple hematology, oncology visits, ongoing evaluation for her persistent anemia.  MDM   Final diagnoses:  Symptomatic anemia   Patient presents with concern of chest pain. Notably, the patient has had discomfort for about 2 weeks, there is low suspicion of ongoing ischemic changes. Patient does have possible arrhythmia, with intermittent atrial fibrillation. Patient has no obvious ongoing source of bleeding. Heme occult pending on admission. Given the anemia, concern for symptomatic changes, the patient was started on blood transfusion, admitted for further evaluation and management.  CRITICAL CARE Performed by: Carmin Muskrat Total critical care time: 40 minutes Critical care time was exclusive of separately billable procedures and treating other patients. Critical care was necessary to treat or prevent imminent or life-threatening deterioration. Critical care was time spent personally by me on the following activities: development of treatment plan with patient and/or surrogate as well as nursing, discussions with consultants, evaluation of patient's response to treatment, examination of patient, obtaining history from patient or surrogate, ordering and  performing treatments and interventions, ordering and review of laboratory studies, ordering and review of radiographic studies, pulse oximetry and re-evaluation of patient's condition.  Carmin Muskrat, MD 04/07/15 [id number]

## 2015-04-07 NOTE — Patient Outreach (Signed)
[facility]) Care Management  [place]   04/07/2015  Marisa [name] 1940-11-04 [id number]   Marisa [name] is a 74 y.o. female who was referred to [facility] for medication review and medication assistance. She is having a hard time affording her insulin and premarin.  She has Medicare A and B and BCBS supplement.   Patient was denied Medicare Extra Help. All of the patient assistance programs for medications are closed for the year.   Will close patient to pharmacy program as there is no further assistance that we can provider. Please re-refer if any new issues arise.  Nicoletta Ba, PharmD, [facility] [phone]

## 2015-04-07 NOTE — Progress Notes (Signed)
Pt reports Chest pain located in the center of her chest, stating that it is a "aching" feeling rating a 4-5 out of 10. The pain is new and has been constant for 2 weeks and she has not informed any doctor at this point. Dr. Marko Plume aware. Per Dr. Marko Plume pt is to be sent directly to the ED at this time. Pt and husband aware and verbalizes understanding.  ED called and pt escorted to the ED by staff. Pt and VS stable.

## 2015-04-07 NOTE — Progress Notes (Signed)
PHARMACIST - PHYSICIAN ORDER COMMUNICATION  CONCERNING: P&T Medication Policy on Herbal Medications  DESCRIPTION:  This patient's order for:  COQ-10  has been noted.  This product(s) is classified as an "herbal" or natural product. Due to a lack of definitive safety studies or FDA approval, nonstandard manufacturing practices, plus the potential risk of unknown drug-drug interactions while on inpatient medications, the Pharmacy and Therapeutics Committee does not permit the use of "herbal" or natural products of this type within [place].   ACTION TAKEN: The pharmacy department is unable to verify this order at this time and your patient has been informed of this safety policy. Please reevaluate patient's clinical condition at discharge and address if the herbal or natural product(s) should be resumed at that time.  

## 2015-04-07 NOTE — ED Notes (Signed)
Pt transferred to DG at present time; IV attempt successful but unable to draw sufficient blood from site for ALL labs; phlebotomy made aware and states will reattempt with pt return from [facility].

## 2015-04-07 NOTE — H&P (Signed)
[facility] History and Physical  Marisa [name] [id number] DOB: 1941/04/11 DOA: 04/07/2015  Referring physician: Emergency Department PCP: Tommy Medal, MD  Specialists:   Chief Complaint: Chest pain, symptomatic anemia  HPI: Marisa [name] is a 74 y.o. female with a hx of DM2, htn, RA, chronic anemia thought to be secondary to occult GI bleeding followed by Dr. Marko Plume and s/p colonoscopy by Dr. Fuller Plan in 2014 demonstrating diverticulosis with hemorrhoids. Patient reportedly contacted [place] with increased weakness, found to have hgb of 8.4 (baseline closer to 10). Patient presented to [facility] for transfusion on 12/23, however, complained of chest pains. Pt was then referred to the ED for further work up. In the ED, pt noted to be weak with tachycardia. 2 units PRBC's were ordered. Hemaccult of stools pending  Given concerns of possible symptomatic anemia, Hospitalist was consulted for consideration for admission.  Review of Systems:   Review of Systems  Constitutional: Positive for malaise/fatigue.  HENT: Negative for ear pain and tinnitus.   Eyes: Negative for pain and discharge.  Respiratory: Negative for hemoptysis and shortness of breath.   Cardiovascular: Positive for chest pain. Negative for claudication.  Gastrointestinal: Negative for vomiting and abdominal pain.  Genitourinary: Negative for hematuria and flank pain.  Musculoskeletal: Negative for back pain and joint pain.  Neurological: Positive for weakness. Negative for tremors, seizures and loss of consciousness.  Psychiatric/Behavioral: Negative for hallucinations and memory loss.     Past Medical History  Diagnosis Date  . Diabetic ketoacidosis ([place]) 11/2010  . Myocardial infarction [facility])     Chest pain s/p normal cath in 08/2004 then NSTEMI during 11/2010 admission, negative Myoview  . Diabetes mellitus type 2, insulin dependent ([place])     initial diagnoses 11/2008  . GERD (gastroesophageal reflux disease)    . Esophageal stricture 06/2004    Dilation 06/2004  . Supraventricular tachycardia ([place])   . history of  pericarditis 12/2002  . Pericardial effusion   . Rheumatoid arthritis(714.0)     On MTX and chronic steroids  . Osteoarthritis   . Asthma     Home 3L O2   . Seizure ([place]) 11/2008  . Rectal bleeding 11/2010    Large hemorrhoids  . Chronic anemia   . History of blood transfusion   . Diverticulosis   . Anemia   . Family history of anesthesia complication     DAUGHTER HAS NAUSEA  . Asthma   . Hypertension   . Diabetes mellitus without complication ([place])   . Arthritis   . Rheumatoid arteritis   . Depression     patient feels depressed at the end of the month  . Oxygen deficiency    Past Surgical History  Procedure Laterality Date  . Cholecystectomy    . Abdominal hysterectomy    . Incise and drain abcess  09/2011    I&D of peri-rectal abcess per Dr Zella Richer.   . Colonoscopy N/A 01/06/2013    Procedure: COLONOSCOPY;  Surgeon: Ladene Artist, MD;  Location: [facility] ENDOSCOPY;  Service: Endoscopy;  Laterality: N/A;  . Esophagogastroduodenoscopy N/A 02/11/2013    Procedure: ESOPHAGOGASTRODUODENOSCOPY (EGD);  Surgeon: Irene Shipper, MD;  Location: [facility] ENDOSCOPY;  Service: Endoscopy;  Laterality: N/A;   Social History:  reports that she has never smoked. She does not have any smokeless tobacco history on file. She reports that she does not drink alcohol or use illicit drugs.  where does patient live--home, ALF, SNF? and with whom if at home?  Can patient participate  in ADLs?  Allergies  Allergen Reactions  . Demerol [Meperidine] Other (See Comments)    Hallucinations  . Penicillins Swelling and Other (See Comments)    Throat swelling Has patient had a PCN reaction causing immediate rash, facial/tongue/throat swelling, SOB or lightheadedness with hypotension: Yes Has patient had a PCN reaction causing severe rash involving mucus membranes or skin necrosis: No Has patient had a PCN  reaction that required hospitalization No Has patient had a PCN reaction occurring within the last 10 years: No If all of the above answers are "NO", then may proceed with Cephalosporin use.   . Enbrel [Etanercept] Swelling    Arm swelling   . Humira [Adalimumab] Swelling    Arm swelling   . Demerol [Meperidine] Itching and Rash  . Penicillins Itching and Rash    Family History  Problem Relation Age of Onset  . Heart disease Mother   . Stroke Father      Prior to Admission medications   Medication Sig Start Date End Date Taking? Authorizing Provider  albuterol (PROVENTIL) (5 MG/ML) 0.5% nebulizer solution Take 2.5 mg by nebulization every 6 (six) hours as needed for wheezing or shortness of breath (shortness of breath).  10/08/11  Yes Nishant Dhungel, MD  Coenzyme Q10 (CO Q 10) 100 MG CAPS Take 100 mg by mouth daily.   Yes Historical Provider, MD  docusate sodium (COLACE) 100 MG capsule Take 200 mg by mouth daily as needed for mild constipation.    Yes Historical Provider, MD  estrogens, conjugated, (PREMARIN) 0.625 MG tablet Take 0.625 mg by mouth daily.    Yes Historical Provider, MD  FeFum-FePoly-FA-B Cmp-C-Biot (INTEGRA PLUS) CAPS Take 1 capsule by mouth 2 (two) times daily. Patient taking differently: Take 1 capsule by mouth daily.  03/06/15  Yes Lennis Marion Downer, MD  fluticasone (FLONASE) 50 MCG/ACT nasal spray Place 2 sprays into both nostrils daily as needed for allergies or rhinitis.   Yes Historical Provider, MD  insulin aspart (NOVOLOG) 100 UNIT/ML injection 0-9 Units, Subcutaneous, 3 times daily with meals CBG < 70: implement hypoglycemia protocol CBG 70 - 120: 0 units CBG 121 - 150: 1 unit CBG 151 - 200: 2 units CBG 201 - 250: 3 units CBG 251 - 300: 5 units CBG 301 - 350: 7 units CBG 351 - 400: 9 units CBG > 400: call MD 01/11/14  Yes Shanker Kristeen Mans, MD  insulin detemir (LEVEMIR) 100 UNIT/ML injection Inject 0.1 mLs (10 Units total) into the skin daily at 12  noon. Patient taking differently: Inject 18 Units into the skin daily at 12 noon.  01/12/14  Yes Verlee Monte, MD  ipratropium (ATROVENT) 0.06 % nasal spray Place 1 spray into both nostrils daily as needed for rhinitis.  04/24/14  Yes Historical Provider, MD  leflunomide (ARAVA) 20 MG tablet Take 20 mg by mouth daily.   Yes Historical Provider, MD  levalbuterol (XOPENEX HFA) 45 MCG/ACT inhaler Inhale 1-2 puffs into the lungs every 4 (four) hours as needed for wheezing (wheezing).    Yes Historical Provider, MD  lisinopril (PRINIVIL,ZESTRIL) 20 MG tablet Take 30 mg by mouth daily.   Yes Historical Provider, MD  LORazepam (ATIVAN) 1 MG tablet Take 1 mg by mouth 3 (three) times daily as needed for anxiety.  03/22/15  Yes Historical Provider, MD  Menthol, Topical Analgesic, (ICY HOT BACK) 5 % PADS Apply 1 patch topically daily as needed (joint pain).   Yes Historical Provider, MD  metoprolol succinate (TOPROL-XL) 100  MG 24 hr tablet Take 100 mg by mouth daily. Take with or immediately following a meal.   Yes Historical Provider, MD  Multiple Vitamin (MULTIVITAMINS PO) Take 1 tablet by mouth. Daily.    Yes Historical Provider, MD  naproxen sodium (ANAPROX) 220 MG tablet Take 220 mg by mouth daily as needed (pain).    Yes Historical Provider, MD  omeprazole (PRILOSEC) 20 MG capsule Take 20 mg by mouth daily. 04/06/15  Yes Historical Provider, MD  predniSONE (DELTASONE) 10 MG tablet Take 10 mg by mouth daily with breakfast.   Yes Historical Provider, MD  pregabalin (LYRICA) 75 MG capsule Take 75 mg by mouth daily.    Yes Historical Provider, MD  promethazine (PHENERGAN) 12.5 MG tablet Take 12.5 mg by mouth every 6 (six) hours as needed for nausea.   Yes Historical Provider, MD  risperiDONE (RISPERDAL) 0.5 MG tablet Take 0.5 tablets (0.25 mg total) by mouth 2 (two) times daily. 01/11/14  Yes Shanker Kristeen Mans, MD  traMADol (ULTRAM) 50 MG tablet Take 1 tablet (50 mg total) by mouth every 12 (twelve) hours as  needed for moderate pain. 02/25/15  Yes Melynda Ripple, MD  vitamin C (ASCORBIC ACID) 500 MG tablet Take 500 mg by mouth 2 (two) times daily.   Yes Historical Provider, MD  Vitamin D-Vitamin K (VITAMIN K2-VITAMIN D3 PO) Take 1 tablet by mouth daily.   Yes Historical Provider, MD  AMBULATORY NON FORMULARY MEDICATION Continuous O2 @@ 2.5-3 LMP    Historical Provider, MD  BD PEN NEEDLE NANO U/F 32G X 4 MM MISC  10/13/14   Historical Provider, MD  FREESTYLE LITE test strip TEST 10 TIMES DAILY ICD E11.65 09/16/14   Historical Provider, MD  hydrocortisone (ANUSOL-HC) 25 MG suppository Insert one suppository daily as needed for hemorrhoids. 08/26/13   Lennis Marion Downer, MD  LORazepam (ATIVAN) 0.5 MG tablet Take 1 tablet (0.5 mg total) by mouth at bedtime. 01/11/14   Shanker Kristeen Mans, MD   Physical Exam: Filed Vitals:   04/07/15 1212 04/07/15 1305 04/07/15 1332 04/07/15 1502  BP: 165/67 162/69 179/67 169/94  Pulse: 94 65 94 83  Temp: 97.6 F (36.4 C)  98.6 F (37 C) 97.9 F (36.6 C)  TempSrc: Oral  Oral Oral  Resp: 18 27 24 20   SpO2: 100% 100% 96% 100%     General:  Awake, in and  Eyes: PERRL B  ENT: membranes moist, dentition fair  Neck: trachea midline, neck supple  Cardiovascular: regular, s1, s2  Respiratory: normal resp effort, no wheezing  Abdomen: soft,nondistended  Skin: normal skin turgor, no abnormal skin lesions   Musculoskeletal: perfused, no clubbing  Psychiatric: mood/affect normal // no auditory/visual hallucinations  Neurologic: cn2-12 grossly intact, strength/sensation intact  Labs on Admission:  Basic Metabolic Panel:  Recent Labs Lab 04/07/15 1223  NA 133*  K 5.0  CL 94*  CO2 28  GLUCOSE 197*  BUN 20  CREATININE 1.33*  CALCIUM 8.5*  MG 2.3   Liver Function Tests:  Recent Labs Lab 04/07/15 1223  AST 21  ALT 17  ALKPHOS 263*  BILITOT 0.4  PROT 6.3*  ALBUMIN 3.2*   No results for input(s): LIPASE, AMYLASE in the last 168 hours. No  results for input(s): AMMONIA in the last 168 hours. CBC:  Recent Labs Lab 04/07/15 0955 04/07/15 1223  WBC 8.6 10.0  NEUTROABS 7.2*  --   HGB 8.4* 8.4*  HCT 26.0* 25.6*  MCV 89.3 88.3  PLT 288 308  Cardiac Enzymes:  Recent Labs Lab 04/07/15 1223  TROPONINI <0.03    BNP (last 3 results) No results for input(s): BNP in the last 8760 hours.  ProBNP (last 3 results) No results for input(s): PROBNP in the last 8760 hours.  CBG: No results for input(s): GLUCAP in the last 168 hours.  Radiological Exams on Admission: Dg Chest 2 View  04/07/2015  CLINICAL DATA:  74 year old with 2-3 week history of constant mid chest pain and shortness of breath. Patient was sent to the emergency department from the [facility] were she was receiving a transfusion for chronic multifactorial anemia. Oxygen dependent patient with rheumatoid arthritis. Chronic kidney disease. EXAM: CHEST  2 VIEW COMPARISON:  01/04/2014 and earlier. FINDINGS: Cardiac silhouette mildly enlarged for AP semi-erect technique, unchanged. Thoracic aorta atherosclerotic, unchanged. Hilar and mediastinal contours otherwise unremarkable. Stable chronic elevation of the left hemidiaphragm. Stable hyperinflation. Lungs clear. Bronchovascular markings normal. Pulmonary vascularity normal. No visible pleural effusions. No pneumothorax. Degenerative changes throughout the thoracic spine. Generalized osseous demineralization. IMPRESSION: Stable hyperinflation consistent with COPD and/or asthma. Stable mild cardiomegaly. No acute cardiopulmonary disease. Electronically Signed   By: Evangeline Dakin M.D.   On: 04/07/2015 12:45    EKG: Independently reviewed. Sinus tach  Assessment/Plan Principal Problem:   Iron deficiency anemia due to chronic blood loss Active Problems:   Anxiety state   Diabetes mellitus type 2, insulin dependent (HCC)   Symptomatic anemia   Diabetes mellitus type 2, controlled ([place])   Essential  hypertension   1. Symptomatic Iron Deficiency Anemia, transfusion dependent 1. Followed by Dr. Marko Plume. Per records, pt likely has chronic iron deficiency anemia secondary to slow GI source and has been transfusion dependent as outpatient 2. 2 units of PRBC's ordered through ED 3. Last colon done in 2014 by Dr. Fuller Plan, demonstrating diverticulosis and hemorrhoids 4. Will admit to med-tele, obs 2. Chest pain 1. No acute changes on ekg. Initial trop neg 2. Given risk factors, will follow serial trop 3. Will check 2d echo 3. DM2 1. Will cont on SSI coverage 4. HTN 1. BP stable, albeit suboptimally controlled 2. Cont home meds for now and titrate as needed 5. Anxiety 1. Seems stable 6. DVT prophylaxis 1. SCD's while inpatient  Code Status: Full Family Communication: Pt in room Disposition Plan: admit med-tele, obs   CHIU, STEPHEN K [facility] Pager [phone]  If 7PM-7AM, please contact night-coverage [email or web address] Password [email or web address] 04/07/2015, 3:10 PM

## 2015-04-07 NOTE — ED Notes (Signed)
Bed: WA11 Expected date:  Expected time:  Means of arrival:  Comments: Pt from [facility] 

## 2015-04-07 NOTE — ED Notes (Signed)
Pump and channel not available until 1500. Pt to be transferred to floor at 1520. Per charge nurse on floor, blood to be administered there to prevent delay. Pt stable and blood ordered was originally to be scheduled appointment at [facility]; not emergent. Blood was not administered at [facility] related to pt complaint of chest pain.

## 2015-04-07 NOTE — ED Notes (Signed)
1x unsuccessful lab draw L forearm

## 2015-04-07 NOTE — Telephone Encounter (Signed)
Marisa [name]'s Hemoglobin is 8.4 today.  She is very tired and weak.  She will receive 2 units of PRBC today at [facility] 1230. She is agreeable to wait at [facility] until 1230 for transfusion.

## 2015-04-07 NOTE — ED Notes (Signed)
Pt complaint of intermittent central chest pain for 2 weeks; was at [facility] for blood transfusion and sent for evaluation.

## 2015-04-08 ENCOUNTER — Observation Stay (HOSPITAL_BASED_OUTPATIENT_CLINIC_OR_DEPARTMENT_OTHER): Payer: Medicare Other

## 2015-04-08 ENCOUNTER — Other Ambulatory Visit: Payer: Self-pay

## 2015-04-08 DIAGNOSIS — R079 Chest pain, unspecified: Secondary | ICD-10-CM | POA: Diagnosis not present

## 2015-04-08 DIAGNOSIS — F411 Generalized anxiety disorder: Secondary | ICD-10-CM | POA: Diagnosis not present

## 2015-04-08 DIAGNOSIS — I129 Hypertensive chronic kidney disease with stage 1 through stage 4 chronic kidney disease, or unspecified chronic kidney disease: Secondary | ICD-10-CM | POA: Diagnosis not present

## 2015-04-08 DIAGNOSIS — E119 Type 2 diabetes mellitus without complications: Secondary | ICD-10-CM | POA: Diagnosis not present

## 2015-04-08 DIAGNOSIS — D500 Iron deficiency anemia secondary to blood loss (chronic): Secondary | ICD-10-CM | POA: Diagnosis not present

## 2015-04-08 DIAGNOSIS — I10 Essential (primary) hypertension: Secondary | ICD-10-CM | POA: Diagnosis not present

## 2015-04-08 LAB — COMPREHENSIVE METABOLIC PANEL
ALBUMIN: 3.1 g/dL — AB (ref 3.5–5.0)
ALT: 17 U/L (ref 14–54)
ANION GAP: 14 (ref 5–15)
AST: 16 U/L (ref 15–41)
Alkaline Phosphatase: 243 U/L — ABNORMAL HIGH (ref 38–126)
BUN: 24 mg/dL — AB (ref 6–20)
CHLORIDE: 91 mmol/L — AB (ref 101–111)
CO2: 29 mmol/L (ref 22–32)
Calcium: 9 mg/dL (ref 8.9–10.3)
Creatinine, Ser: 1.36 mg/dL — ABNORMAL HIGH (ref 0.44–1.00)
GFR calc non Af Amer: 37 mL/min — ABNORMAL LOW (ref >60)
GFR, EST AFRICAN AMERICAN: 43 mL/min — AB (ref >60)
GLUCOSE: 508 mg/dL — AB (ref 65–99)
Potassium: 5 mmol/L (ref 3.5–5.1)
SODIUM: 134 mmol/L — AB (ref 135–145)
Total Bilirubin: 1.3 mg/dL — ABNORMAL HIGH (ref 0.3–1.2)
Total Protein: 6.5 g/dL (ref 6.5–8.1)

## 2015-04-08 LAB — GLUCOSE, CAPILLARY
GLUCOSE-CAPILLARY: 63 mg/dL — AB (ref 65–99)
Glucose-Capillary: 389 mg/dL — ABNORMAL HIGH (ref 65–99)
Glucose-Capillary: 275 mg/dL — ABNORMAL HIGH (ref 65–99)

## 2015-04-08 LAB — CBC
HEMATOCRIT: 32.2 % — AB (ref 36.0–46.0)
HEMOGLOBIN: 10.5 g/dL — AB (ref 12.0–15.0)
MCH: 29.1 pg (ref 26.0–34.0)
MCHC: 32.6 g/dL (ref 30.0–36.0)
MCV: 89.2 fL (ref 78.0–100.0)
Platelets: 309 10*3/uL (ref 150–400)
RBC: 3.61 MIL/uL — ABNORMAL LOW (ref 3.87–5.11)
RDW: 12.8 % (ref 11.5–15.5)
WBC: 8.1 10*3/uL (ref 4.0–10.5)

## 2015-04-08 LAB — TROPONIN I
TROPONIN I: 0.05 ng/mL — AB (ref <0.031)
TROPONIN I: 0.1 ng/mL — AB (ref <0.031)
Troponin I: 0.06 ng/mL — ABNORMAL HIGH (ref <0.031)

## 2015-04-08 MED ORDER — INSULIN ASPART 100 UNIT/ML ~~LOC~~ SOLN
8.0000 [IU] | Freq: Once | SUBCUTANEOUS | Status: AC
  Administered 2015-04-08: 8 [IU] via SUBCUTANEOUS

## 2015-04-08 NOTE — Discharge Summary (Signed)
Physician Discharge Summary  Marisa [name] [id number] DOB: [birth year] DOA: 04/07/2015  PCP: Tommy Medal, MD  Admit date: 04/07/2015 Discharge date: 04/08/2015  Time spent: 20 minutes  Recommendations for Outpatient Follow-up:  1. Follow up with PCP in 1-2 weeks  2. Consider outpatient stress test to complete work up.  Discharge Diagnoses:  Principal Problem:   Iron deficiency anemia due to chronic blood loss Active Problems:   Anxiety state   Diabetes mellitus type 2, insulin dependent (HCC)   Symptomatic anemia   Diabetes mellitus type 2, controlled ([place])   Essential hypertension   Discharge Condition: Stable  Diet recommendation: Diabetic, heart healthy  Filed Weights   04/07/15 1530  Weight: 53 kg (116 lb 13.5 oz)    History of present illness:  Please review dictated H and P from 12/23 for details. Briefly, 73 y.o. female with a hx of DM2, htn, RA, chronic anemia thought to be secondary to occult GI bleeding followed by Dr. Marko Plume and s/p colonoscopy by Dr. Fuller Plan in 2014 demonstrating diverticulosis with hemorrhoids. Patient reportedly contacted [place] with increased weakness, found to have hgb of 8.4 (baseline closer to 10). Patient presented to [facility] for transfusion on 12/23, however, complained of chest pains. Pt was then referred to the ED for further work up. In the ED, pt noted to be weak with tachycardia. 2 units PRBC's were ordered.   Hospital Course:  1. Symptomatic Iron Deficiency Anemia, transfusion dependent 1. Followed by Dr. Marko Plume. Per records, pt likely has chronic iron deficiency anemia secondary to slow GI source and has been transfusion dependent as outpatient 2. 2 units of PRBC's ordered through ED and patient's hgb improved to over 10 3. Last colon done in 2014 by Dr. Fuller Plan, demonstrating diverticulosis and hemorrhoids 4. Patient remained hemodynamically stable 2. Chest pain 1. No acute changes on ekg. Initial trop neg 2. Serial trop  trended up slightly to peak of 0.1 but trended back down to 0.6 3. Patient remained chest pain free and asymptomatic 4. 2d echo done on 12/24 with no wall motion abnormality 3. DM2 1. Will cont on SSI coverage 4. HTN 1. BP stable, albeit suboptimally controlled 2. Cont home meds for now and titrate as needed 5. Anxiety 1. Seems stable 6. DVT prophylaxis 1. SCD's while inpatient  Discharge Exam: Filed Vitals:   04/07/15 1745 04/07/15 1950 04/08/15 0042 04/08/15 0628  BP: 130/69 159/75 140/80 166/75  Pulse: 90 87 80 91  Temp: 97.6 F (36.4 C) 97.9 F (36.6 C) 97.6 F (36.4 C) 98 F (36.7 C)  TempSrc: Oral Oral Oral Oral  Resp: 20 20 16 20   Height:      Weight:      SpO2: 99% 100% 100% 100%    General: Awake, in nad Cardiovascular: regular, s1, s2 Respiratory: normal resp effort, no wheezing  Discharge Instructions     Medication List    TAKE these medications        albuterol (5 MG/ML) 0.5% nebulizer solution  Commonly known as:  PROVENTIL  Take 2.5 mg by nebulization every 6 (six) hours as needed for wheezing or shortness of breath (shortness of breath).     AMBULATORY NON FORMULARY MEDICATION  Continuous O2 @@ 2.5-3 LMP     BD PEN NEEDLE NANO U/F 32G X 4 MM Misc  Generic drug:  Insulin Pen Needle     Co Q 10 100 MG Caps  Take 100 mg by mouth daily.     docusate sodium 100 MG  capsule  Commonly known as:  COLACE  Take 200 mg by mouth daily as needed for mild constipation.     estrogens (conjugated) 0.625 MG tablet  Commonly known as:  PREMARIN  Take 0.625 mg by mouth daily.     fluticasone 50 MCG/ACT nasal spray  Commonly known as:  FLONASE  Place 2 sprays into both nostrils daily as needed for allergies or rhinitis.     FREESTYLE LITE test strip  Generic drug:  glucose blood  TEST 10 TIMES DAILY ICD E11.65     hydrocortisone 25 MG suppository  Commonly known as:  ANUSOL-HC  Insert one suppository daily as needed for hemorrhoids.     ICY HOT  BACK 5 % Pads  Generic drug:  Menthol (Topical Analgesic)  Apply 1 patch topically daily as needed (joint pain).     insulin aspart 100 UNIT/ML injection  Commonly known as:  novoLOG  0-9 Units, Subcutaneous, 3 times daily with meals CBG < 70: implement hypoglycemia protocol CBG 70 - 120: 0 units CBG 121 - 150: 1 unit CBG 151 - 200: 2 units CBG 201 - 250: 3 units CBG 251 - 300: 5 units CBG 301 - 350: 7 units CBG 351 - 400: 9 units CBG > 400: call MD     insulin detemir 100 UNIT/ML injection  Commonly known as:  LEVEMIR  Inject 0.1 mLs (10 Units total) into the skin daily at 12 noon.     INTEGRA PLUS Caps  Take 1 capsule by mouth 2 (two) times daily.     ipratropium 0.06 % nasal spray  Commonly known as:  ATROVENT  Place 1 spray into both nostrils daily as needed for rhinitis.     leflunomide 20 MG tablet  Commonly known as:  ARAVA  Take 20 mg by mouth daily.     levalbuterol 45 MCG/ACT inhaler  Commonly known as:  XOPENEX HFA  Inhale 1-2 puffs into the lungs every 4 (four) hours as needed for wheezing (wheezing).     lisinopril 20 MG tablet  Commonly known as:  PRINIVIL,ZESTRIL  Take 30 mg by mouth daily.     LORazepam 0.5 MG tablet  Commonly known as:  ATIVAN  Take 1 tablet (0.5 mg total) by mouth at bedtime.     LORazepam 1 MG tablet  Commonly known as:  ATIVAN  Take 1 mg by mouth 3 (three) times daily as needed for anxiety.     LYRICA 75 MG capsule  Generic drug:  pregabalin  Take 75 mg by mouth daily.     metoprolol succinate 100 MG 24 hr tablet  Commonly known as:  TOPROL-XL  Take 100 mg by mouth daily. Take with or immediately following a meal.     MULTIVITAMINS PO  Take 1 tablet by mouth. Daily.     naproxen sodium 220 MG tablet  Commonly known as:  ANAPROX  Take 220 mg by mouth daily as needed (pain).     omeprazole 20 MG capsule  Commonly known as:  PRILOSEC  Take 20 mg by mouth daily.     predniSONE 10 MG tablet  Commonly known as:  DELTASONE   Take 10 mg by mouth daily with breakfast.     promethazine 12.5 MG tablet  Commonly known as:  PHENERGAN  Take 12.5 mg by mouth every 6 (six) hours as needed for nausea.     risperiDONE 0.5 MG tablet  Commonly known as:  RISPERDAL  Take 0.5 tablets (0.25 mg  total) by mouth 2 (two) times daily.     traMADol 50 MG tablet  Commonly known as:  ULTRAM  Take 1 tablet (50 mg total) by mouth every 12 (twelve) hours as needed for moderate pain.     vitamin C 500 MG tablet  Commonly known as:  ASCORBIC ACID  Take 500 mg by mouth 2 (two) times daily.     VITAMIN K2-VITAMIN D3 PO  Take 1 tablet by mouth daily.       Allergies  Allergen Reactions  . Demerol [Meperidine] Other (See Comments)    Hallucinations  . Penicillins Swelling and Other (See Comments)    Throat swelling Has patient had a PCN reaction causing immediate rash, facial/tongue/throat swelling, SOB or lightheadedness with hypotension: Yes Has patient had a PCN reaction causing severe rash involving mucus membranes or skin necrosis: No Has patient had a PCN reaction that required hospitalization No Has patient had a PCN reaction occurring within the last 10 years: No If all of the above answers are "NO", then may proceed with Cephalosporin use.   . Enbrel [Etanercept] Swelling    Arm swelling   . Humira [Adalimumab] Swelling    Arm swelling   . Demerol [Meperidine] Itching and Rash  . Penicillins Itching and Rash   Follow-up Information    Follow up with PANG,RICHARD, MD. Schedule an appointment as soon as possible for a visit in 1 week.   Specialty:  Internal Medicine   Why:  Hospital follow up   Contact information:   [address] [place] [address] [phone]        The results of significant diagnostics from this hospitalization (including imaging, microbiology, ancillary and laboratory) are listed below for reference.    Significant Diagnostic Studies: Dg Chest 2  View  04/07/2015  CLINICAL DATA:  74 year old with 2-3 week history of constant mid chest pain and shortness of breath. Patient was sent to the emergency department from the [facility] were she was receiving a transfusion for chronic multifactorial anemia. Oxygen dependent patient with rheumatoid arthritis. Chronic kidney disease. EXAM: CHEST  2 VIEW COMPARISON:  01/04/2014 and earlier. FINDINGS: Cardiac silhouette mildly enlarged for AP semi-erect technique, unchanged. Thoracic aorta atherosclerotic, unchanged. Hilar and mediastinal contours otherwise unremarkable. Stable chronic elevation of the left hemidiaphragm. Stable hyperinflation. Lungs clear. Bronchovascular markings normal. Pulmonary vascularity normal. No visible pleural effusions. No pneumothorax. Degenerative changes throughout the thoracic spine. Generalized osseous demineralization. IMPRESSION: Stable hyperinflation consistent with COPD and/or asthma. Stable mild cardiomegaly. No acute cardiopulmonary disease. Electronically Signed   By: Evangeline Dakin M.D.   On: 04/07/2015 12:45    Microbiology: No results found for this or any previous visit (from the past 240 hour(s)).   Labs: Basic Metabolic Panel:  Recent Labs Lab 04/07/15 1223 04/08/15 0515  NA 133* 134*  K 5.0 5.0  CL 94* 91*  CO2 28 29  GLUCOSE 197* 508*  BUN 20 24*  CREATININE 1.33* 1.36*  CALCIUM 8.5* 9.0  MG 2.3  --    Liver Function Tests:  Recent Labs Lab 04/07/15 1223 04/08/15 0515  AST 21 16  ALT 17 17  ALKPHOS 263* 243*  BILITOT 0.4 1.3*  PROT 6.3* 6.5  ALBUMIN 3.2* 3.1*   No results for input(s): LIPASE, AMYLASE in the last 168 hours. No results for input(s): AMMONIA in the last 168 hours. CBC:  Recent Labs Lab 04/07/15 0955 04/07/15 1223 04/08/15 0515  WBC 8.6 10.0 8.1  NEUTROABS 7.2*  --   --  HGB 8.4* 8.4* 10.5*  HCT 26.0* 25.6* 32.2*  MCV 89.3 88.3 89.2  PLT 288 308 309   Cardiac Enzymes:  Recent Labs Lab  04/07/15 1223 04/07/15 1718 04/07/15 2306 04/08/15 0515 04/08/15 1126  TROPONINI <0.03 <0.03 0.05* 0.10* 0.06*   BNP: BNP (last 3 results) No results for input(s): BNP in the last 8760 hours.  ProBNP (last 3 results) No results for input(s): PROBNP in the last 8760 hours.  CBG:  Recent Labs Lab 04/07/15 1708 04/07/15 2211 04/08/15 0800 04/08/15 1210  GLUCAP 408* 198* 389* 63*    Signed:  CHIU, STEPHEN K  [facility] 04/08/2015, 12:52 PM

## 2015-04-08 NOTE — Progress Notes (Signed)
Troponi this AM has increased to .10 (was .05 at 2300).  Glucose on CMP is 508.  Hgb up to 10.5 (was 8.4 pre transfusion).   Many imbalances on CMP.   VSS.  Has chronic back pain as reported by pt and husband and noted to have 3  "icy hot" patches on mid back from home.  Denies chest pain like she had yesterday.  Pain after last pain med down to a 4 and is now asleep.  Have contacted Kathline Magic, NP on call.  Awaiting response

## 2015-04-08 NOTE — Progress Notes (Signed)
EKG done - SR c PAC, HR 90.  Information given to [employer].  Will continue to monitor

## 2015-04-08 NOTE — Progress Notes (Signed)
  Echocardiogram 2D Echocardiogram has been performed.  Jennette Dubin 04/08/2015, 9:41 AM

## 2015-04-08 NOTE — Progress Notes (Signed)
First 2 troponi were less than .03.  Now her 74 trop is .05.  Pt is asymptomatic, VSS, SR on monitor with PVCs, received 1 unit of PRBCs today.  Is for a CMP/CBC/Trop in AM.  Kathline Magic on call and information given.  Will continue to monitor.

## 2015-04-11 LAB — TYPE AND SCREEN
ABO/RH(D): O POS
ANTIBODY SCREEN: NEGATIVE
UNIT DIVISION: 0
UNIT DIVISION: 0

## 2015-04-12 ENCOUNTER — Other Ambulatory Visit: Payer: Self-pay

## 2015-04-12 NOTE — Patient Outreach (Signed)
[facility]) Care Management  04/12/2015  AMIIRA LIER [birth year] [id number]   Telephone Assessment   S/O: Follow up call to check on patient from previous phone call. Patient has given consent to discuss care with her spouse. Spouse shares with RN CM recent medical events. Patient had MD appt. She was noted to have low hgb level and needed a blood transfusion. She started having complaints of chest pain and was observed overnight at hospital. Spouse states several test were done but no definitive diagnosis and cause of issue determined. He states that medical team thinks it may have been more GI related as symptoms occurred after eating a hot dog. She was started on Prilosec. Patient is doing fine and has had no other complaints since then. Blood transfusion improved lab work. Spouse states hgb was 8.1 but after transfusion up to 10.5. Spouse did discuss his concerns with patient's memory loss/issues with MD during appt. She has been referred to [facility] and has appt on 04/26/15. She has appt with eye MD on 04/19/15 and goes back to PCP in two months.  A: DM, memory loss   Plan: RN CM will contact patient within a month to follow up on upcoming appts. RN CM confirmed that pt/spouse is aware of how to contact [facility] services. RN CM confirmed that pt/spouse is aware of when to seek medical attention for changes in condition.  Enzo Montgomery, RN,BSN,CCM [place] Management Telephonic Care Management Coordinator Direct Phone: [phone] Toll Free: [phone] Fax: [phone]

## 2015-04-19 DIAGNOSIS — H5213 Myopia, bilateral: Secondary | ICD-10-CM | POA: Diagnosis not present

## 2015-04-19 DIAGNOSIS — H52223 Regular astigmatism, bilateral: Secondary | ICD-10-CM | POA: Diagnosis not present

## 2015-04-19 DIAGNOSIS — H35373 Puckering of macula, bilateral: Secondary | ICD-10-CM | POA: Diagnosis not present

## 2015-04-19 DIAGNOSIS — H2513 Age-related nuclear cataract, bilateral: Secondary | ICD-10-CM | POA: Diagnosis not present

## 2015-04-19 DIAGNOSIS — H35033 Hypertensive retinopathy, bilateral: Secondary | ICD-10-CM | POA: Diagnosis not present

## 2015-04-19 DIAGNOSIS — E119 Type 2 diabetes mellitus without complications: Secondary | ICD-10-CM | POA: Diagnosis not present

## 2015-04-19 DIAGNOSIS — I10 Essential (primary) hypertension: Secondary | ICD-10-CM | POA: Diagnosis not present

## 2015-04-19 DIAGNOSIS — H25013 Cortical age-related cataract, bilateral: Secondary | ICD-10-CM | POA: Diagnosis not present

## 2015-04-26 ENCOUNTER — Telehealth: Payer: Self-pay | Admitting: *Deleted

## 2015-04-26 ENCOUNTER — Telehealth: Payer: Self-pay | Admitting: Neurology

## 2015-04-26 ENCOUNTER — Ambulatory Visit: Payer: Medicare Other | Admitting: Neurology

## 2015-04-26 NOTE — Telephone Encounter (Signed)
cx NP appt same day d/t arthritis. She called at 2:55pm on 04/26/15.

## 2015-04-26 NOTE — Telephone Encounter (Signed)
Patient called at 2:55pm to advise she will be unable to come to 4:00pm appointment today with Dr. Jaynee Eagles due to arthritis.

## 2015-04-27 ENCOUNTER — Encounter: Payer: Self-pay | Admitting: Neurology

## 2015-05-01 DIAGNOSIS — R262 Difficulty in walking, not elsewhere classified: Secondary | ICD-10-CM | POA: Diagnosis not present

## 2015-05-01 DIAGNOSIS — M170 Bilateral primary osteoarthritis of knee: Secondary | ICD-10-CM | POA: Diagnosis not present

## 2015-05-01 DIAGNOSIS — M25561 Pain in right knee: Secondary | ICD-10-CM | POA: Diagnosis not present

## 2015-05-01 DIAGNOSIS — M25562 Pain in left knee: Secondary | ICD-10-CM | POA: Diagnosis not present

## 2015-05-03 ENCOUNTER — Other Ambulatory Visit: Payer: Self-pay

## 2015-05-03 NOTE — Patient Outreach (Signed)
[facility]) Care Management  05/03/2015  CHEMIKA VESELY 1940/04/19 [id number]   Telephone Assessment  Outreach call to patient. Spoke with patient who reported she was "still in the bed but doing fine." She denies any new changes in condition since last call with RN CM. Patient appears to be more alert and able to respond to questions more appropriately. She states that she feels her memory is better. Blood sugars have been stable. She states they have been in the 90s-120s. Patient pleased to report that she is moving around and being more mobile. She had injection in her knee which has allowed this to happen. Patient states she goes back for another injection on 05/10/15. Patient has advance directive packet but has not completed yet. She will discuss it with her spouse.     Plan: RN CM make outreach call to patient next month. RN CM confirmed that patient is aware of how to contact [facility] office. RN CM confirmed that patient is aware of when to seek medical care for changes in condition.  Enzo Montgomery, RN,BSN,CCM [place] Management Telephonic Care Management Coordinator Direct Phone: [phone] Toll Free: [phone] Fax: [phone]

## 2015-05-09 ENCOUNTER — Ambulatory Visit (INDEPENDENT_AMBULATORY_CARE_PROVIDER_SITE_OTHER): Payer: Medicare Other | Admitting: Neurology

## 2015-05-09 ENCOUNTER — Encounter: Payer: Self-pay | Admitting: Neurology

## 2015-05-09 VITALS — BP 176/75 | HR 82 | Temp 97.4°F | Ht <= 58 in

## 2015-05-09 DIAGNOSIS — R5382 Chronic fatigue, unspecified: Secondary | ICD-10-CM

## 2015-05-09 DIAGNOSIS — E871 Hypo-osmolality and hyponatremia: Secondary | ICD-10-CM

## 2015-05-09 DIAGNOSIS — E538 Deficiency of other specified B group vitamins: Secondary | ICD-10-CM | POA: Diagnosis not present

## 2015-05-09 DIAGNOSIS — R413 Other amnesia: Secondary | ICD-10-CM | POA: Diagnosis not present

## 2015-05-09 DIAGNOSIS — F05 Delirium due to known physiological condition: Secondary | ICD-10-CM | POA: Diagnosis not present

## 2015-05-09 DIAGNOSIS — R410 Disorientation, unspecified: Secondary | ICD-10-CM

## 2015-05-09 MED ORDER — DONEPEZIL HCL 10 MG PO TABS: 10.0000 mg | ORAL_TABLET | Freq: Every day | ORAL | Status: DC

## 2015-05-09 NOTE — Patient Instructions (Addendum)
Remember to drink plenty of fluid, eat healthy meals and do not skip any meals. Try to eat protein with a every meal and eat a healthy snack such as fruit or nuts in between meals. Try to keep a regular sleep-wake schedule and try to exercise daily, particularly in the form of walking, 20-30 minutes a day, if you can.   As far as your medications are concerned, I would like to suggest: Aricept, start at 1/2 tablet and then increase to a whole tablet.   As far as diagnostic testing: MRI of the brain, labs  I would like to see you back in 6 months, sooner if we need to. Please call us with any interim questions, concerns, problems, updates or refill requests.   Our phone number is [phone]. We also have an after hours call service for urgent matters and there is a physician on-call for urgent questions. For any emergencies you know to call 911 or go to the nearest emergency room

## 2015-05-09 NOTE — Progress Notes (Signed)
[facility]    Provider:  Dr Jaynee Eagles Referring Provider: Tommy Medal, MD Primary Care Physician:  Jani Gravel, MD  CC:  Memory loss  HPI:  Marisa [name] is a 75 y.o. female here as a referral from Dr. Minna Antis for memory issues. PMHx RA, seizure, chronic anemia, HTN, DM, depressin. She is here with her husband who provides information. She has had deckining memory problems for years.  She forgets where she is going in the house, forgets things in the house. That started years ago. Husband does the bills. They live independently in a home. Husband does the all the cooking, her hands are not sturdy. She does not drive, her car broke down. Husband says that she has many times looked at him and not known who he is, later she recognizes him. Started a few years ago and slowly worsening. She is seeing men who are not in her house. She speaks to them. They have an alarm system, people can't get in. They are not there. Huge decline from 2012, he noticed during periods of medical illness she has worsened even more. He has seen a big change since then. he had a bout of memory issues 3 months ago. She was confused, didn't know where the bathroom was, didn't know her husband. She feels like she is back to her baseline. But she has some meory problems at baseline.  She has depression and anxiety that could be contributing. Hallucinations have been worsening in recent years. No FHx of alzheimers.  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 Stable age advanced white matter disease. This likely reflects the sequela of chronic microvascular ischemia. 2. No acute intracranial abnormality or significant interval change.   Review of Systems: Patient complains of symptoms per HPI as well as the following symptoms: depression, anxiety, decreased energy, dizziness, headache,  cinfusion, memory loss, insomnia, restless legs, joint pain, joint swelling, aching muscles, runny nose, moles, constipation. Pertinent negatives per HPI. All others negative.   Social History   Social History  . Marital Status: Married    Spouse Name: [name]  . Number of Children: 2  . Years of Education: 12+   Occupational History  . Retired    Social History Main Topics  . Smoking status: Never Smoker   . Smokeless tobacco: Not on file  . Alcohol Use: No  . Drug Use: No  . Sexual Activity: Not on file   Other Topics Concern  . Not on file   Social History Narrative   ** Merged History Encounter **       Pt lives at home with her husband and has a son and daughter. She is still ambulatory occasionally using a cane or walker. No toxic habits.    Caffeine use: 1 cup decaf daily       Family History  Problem Relation Age of Onset  . Heart disease Mother   . Stroke Father     Past Medical History  Diagnosis Date  . Diabetic ketoacidosis ([place]) 11/2010  . Myocardial infarction [facility])     Chest pain s/p normal cath in 08/2004 then NSTEMI during 11/2010 admission, negative Myoview  . Diabetes mellitus type 2, insulin dependent ([place])     initial diagnoses 11/2008  . GERD (gastroesophageal reflux disease)   . Esophageal stricture 06/2004    Dilation 06/2004  . Supraventricular tachycardia ([place])   .  history of  pericarditis 12/2002  . Pericardial effusion   . Rheumatoid arthritis(714.0)     On MTX and chronic steroids  . Osteoarthritis   . Asthma     Home 3L O2   . Seizure ([place]) 11/2008  . Rectal bleeding 11/2010    Large hemorrhoids  . Chronic anemia   . History of blood transfusion   . Diverticulosis   . Anemia   . Family history of anesthesia complication     DAUGHTER HAS NAUSEA  . Asthma   . Hypertension   . Diabetes mellitus without complication ([place])   . Arthritis   . Rheumatoid arteritis   . Depression     patient feels depressed at the end of the month  .  Oxygen deficiency     Past Surgical History  Procedure Laterality Date  . Cholecystectomy    . Abdominal hysterectomy  1980  . Incise and drain abcess  09/2011    I&D of peri-rectal abcess per Dr Zella Richer.   . Colonoscopy N/A 01/06/2013    Procedure: COLONOSCOPY;  Surgeon: Ladene Artist, MD;  Location: [facility] ENDOSCOPY;  Service: Endoscopy;  Laterality: N/A;  . Esophagogastroduodenoscopy N/A 02/11/2013    Procedure: ESOPHAGOGASTRODUODENOSCOPY (EGD);  Surgeon: Irene Shipper, MD;  Location: [facility] ENDOSCOPY;  Service: Endoscopy;  Laterality: N/A;    Current Outpatient Prescriptions  Medication Sig Dispense Refill  . albuterol (PROVENTIL) (5 MG/ML) 0.5% nebulizer solution Take 2.5 mg by nebulization every 6 (six) hours as needed for wheezing or shortness of breath (shortness of breath).     . AMBULATORY NON FORMULARY MEDICATION Continuous O2 @@ 2.5-3 LMP    . BD PEN NEEDLE NANO U/F 32G X 4 MM MISC     . Coenzyme Q10 (CO Q 10) 100 MG CAPS Take 100 mg by mouth daily.    Marland Kitchen docusate sodium (COLACE) 100 MG capsule Take 200 mg by mouth daily as needed for mild constipation.     Marland Kitchen estrogens, conjugated, (PREMARIN) 0.625 MG tablet Take 0.625 mg by mouth daily.     Marland Kitchen FeFum-FePoly-FA-B Cmp-C-Biot (INTEGRA PLUS) CAPS Take 1 capsule by mouth 2 (two) times daily. (Patient taking differently: Take 1 capsule by mouth daily. ) 60 capsule 6  . fluticasone (FLONASE) 50 MCG/ACT nasal spray Place 2 sprays into both nostrils daily as needed for allergies or rhinitis.    Marland Kitchen FREESTYLE LITE test strip TEST 10 TIMES DAILY ICD E11.65  10  . hydrocortisone (ANUSOL-HC) 25 MG suppository Insert one suppository daily as needed for hemorrhoids. 30 suppository 1  . insulin aspart (NOVOLOG) 100 UNIT/ML injection 0-9 Units, Subcutaneous, 3 times daily with meals CBG < 70: implement hypoglycemia protocol CBG 70 - 120: 0 units CBG 121 - 150: 1 unit CBG 151 - 200: 2 units CBG 201 - 250: 3 units CBG 251 - 300: 5 units CBG 301 -  350: 7 units CBG 351 - 400: 9 units CBG > 400: call MD 10 mL 11  . insulin detemir (LEVEMIR) 100 UNIT/ML injection Inject 0.1 mLs (10 Units total) into the skin daily at 12 noon. (Patient taking differently: Inject 18 Units into the skin daily at 12 noon. )    . ipratropium (ATROVENT) 0.06 % nasal spray Place 1 spray into both nostrils daily as needed for rhinitis.   4  . leflunomide (ARAVA) 20 MG tablet Take 20 mg by mouth daily.    Marland Kitchen levalbuterol (XOPENEX HFA) 45 MCG/ACT inhaler Inhale 1-2 puffs into the lungs  every 4 (four) hours as needed for wheezing (wheezing).     Marland Kitchen lisinopril (PRINIVIL,ZESTRIL) 20 MG tablet Take 30 mg by mouth daily.    Marland Kitchen LORazepam (ATIVAN) 1 MG tablet Take 1 mg by mouth 3 (three) times daily as needed for anxiety.   2  . Menthol, Topical Analgesic, (ICY HOT BACK) 5 % PADS Apply 1 patch topically daily as needed (joint pain).    . metoprolol succinate (TOPROL-XL) 100 MG 24 hr tablet Take 100 mg by mouth daily. Take with or immediately following a meal.    . Multiple Vitamin (MULTIVITAMINS PO) Take 1 tablet by mouth. Daily.     . naproxen sodium (ANAPROX) 220 MG tablet Take 220 mg by mouth daily as needed (pain).     Marland Kitchen omeprazole (PRILOSEC) 20 MG capsule Take 20 mg by mouth daily.    . predniSONE (DELTASONE) 10 MG tablet Take 10 mg by mouth daily with breakfast.    . pregabalin (LYRICA) 75 MG capsule Take 75 mg by mouth daily.     . promethazine (PHENERGAN) 12.5 MG tablet Take 12.5 mg by mouth every 6 (six) hours as needed for nausea.    . risperiDONE (RISPERDAL) 0.5 MG tablet Take 0.5 tablets (0.25 mg total) by mouth 2 (two) times daily. 30 tablet 0  . traMADol (ULTRAM) 50 MG tablet Take 1 tablet (50 mg total) by mouth every 12 (twelve) hours as needed for moderate pain. 20 tablet 0  . vitamin C (ASCORBIC ACID) 500 MG tablet Take 500 mg by mouth 2 (two) times daily.    . Vitamin D-Vitamin K (VITAMIN K2-VITAMIN D3 PO) Take 1 tablet by mouth daily.     No current  facility-administered medications for this visit.    Allergies as of 05/09/2015 - Review Complete 05/09/2015  Allergen Reaction Noted  . Demerol [meperidine] Other (See Comments) 09/29/2011  . Penicillins Swelling and Other (See Comments) 09/29/2011  . Enbrel [etanercept] Swelling 05/27/2014  . Humira [adalimumab] Swelling 05/27/2014  . Demerol [meperidine] Itching and Rash 05/27/2014  . Penicillins Itching and Rash 05/27/2014    Vitals: BP 176/75 mmHg  Pulse 82  Temp(Src) 97.4 F (36.3 C) (Oral)  Ht 4\' 9"  (1.448 m) Last Weight:  Wt Readings from Last 1 Encounters:  04/07/15 116 lb 13.5 oz (53 kg)   Last Height:   Ht Readings from Last 1 Encounters:  05/09/15 4\' 9"  (1.448 m)    Physical exam: Exam: Gen: NAD, conversant                 CV: RRR, no MRG. No peripheral edema, warm, nontender Eyes: Conjunctivae clear without exudates or hemorrhage  Neuro: Detailed Neurologic Exam  Speech:    Speech is normal; fluent and spontaneous with normal comprehension.  Cognition: Montreal Cognitive Assessment  05/09/2015  Visuospatial/ Executive (0/5) 1  Naming (0/3) 2  Attention: Read list of digits (0/2) 2  Attention: Read list of letters (0/1) 1  Attention: Serial 7 subtraction starting at 100 (0/3) 0  Language: Repeat phrase (0/2) 2  Language : Fluency (0/1) 0  Abstraction (0/2) 1  Delayed Recall (0/5) 1  Orientation (0/6) 4  Total 14  Adjusted Score (based on education) 14    Cranial Nerves:    The pupils are equal, round, and reactive to light. Attempted fundoscopic exam and could not visualize due to cataracts. Visual fields are full to finger confrontation. Extraocular movements are intact. Trigeminal sensation is intact and the muscles of mastication are  normal. The face is symmetric. The palate elevates in the midline. Hearing intact. Voice is normal. Shoulder shrug is normal. The tongue has normal motion without fasciculations.   Coordination:    No  dysmetria  Gait:    Can bear weight, difficult due to knee pain, wide based cautious gait, uses walking aids at home but is in a wheelchair today  Motor Observation:    No asymmetry, no atrophy, and no involuntary movements noted. Tone:    Normal muscle tone.    Posture:    Posture is slightly stooped    Strength: give-way, no focal deficits noted, antigravity in all extremities        Sensation: intact to LT     Reflex Exam:  DTR's: Absent AJs. 1+ uppers. Deferred patellars due to pain     Toes:    The toes are equivocal bilaterally.   Clonus:    Clonus is absent.      Assessment/Plan:  75 year old with progressive memory loss and hallucinations. MoCA 14/30 c/w significant cognitive decline. Memory changes are likely multifactorial including polypharmacy, depression and anxiety, medical conditions and possibly dementia.  Will order an MRi of the brain, B12 and TSH Start Aricept. Add namenda in 3 months and check in to see how they are doing Had a talk with her husband while she was in lab, explained that the slowly progressive memory loss is characteristic of dementia. I can't tell him if she has dementia at this time but appears she has significant memory changes. They deferred neurocognitive testing. We can follow clinically. He says she doesn't want to leave the house, I encouraged staying social and active and keeping her mind engaged. If she has anoter steep decline, he should follow with his primary care to ensure no metabolic/toxic or innfectious reason like a UTI or dehydration. Discussed side effects of aricep: Nausea, vomiting, diarrhea, loss of appetite/weight loss, dizziness, drowsiness, weakness, trouble sleeping, shakiness (tremor), or muscle cramps. More serious side effects can include AV block, bradycardia, syncope, seizures, GI bleeding, rash.    Sarina Ill, MD  [facility] [address] [place], [place]  [address]  Phone [phone] Fax [phone]

## 2015-05-10 ENCOUNTER — Telehealth: Payer: Self-pay | Admitting: *Deleted

## 2015-05-10 DIAGNOSIS — M25561 Pain in right knee: Secondary | ICD-10-CM | POA: Diagnosis not present

## 2015-05-10 DIAGNOSIS — R2689 Other abnormalities of gait and mobility: Secondary | ICD-10-CM | POA: Diagnosis not present

## 2015-05-10 DIAGNOSIS — M1711 Unilateral primary osteoarthritis, right knee: Secondary | ICD-10-CM | POA: Diagnosis not present

## 2015-05-10 DIAGNOSIS — M25562 Pain in left knee: Secondary | ICD-10-CM | POA: Diagnosis not present

## 2015-05-10 DIAGNOSIS — M170 Bilateral primary osteoarthritis of knee: Secondary | ICD-10-CM | POA: Diagnosis not present

## 2015-05-10 LAB — THYROID PANEL WITH TSH
FREE THYROXINE INDEX: 2 (ref 1.2–4.9)
T3 Uptake Ratio: 26 % (ref 24–39)
T4 TOTAL: 7.7 ug/dL (ref 4.5–12.0)
TSH: 1.97 u[IU]/mL (ref 0.450–4.500)

## 2015-05-10 LAB — B12 AND FOLATE PANEL
Folate: >20 ng/mL (ref >3.0)
Vitamin B-12: 599 pg/mL (ref 211–946)

## 2015-05-10 NOTE — Telephone Encounter (Signed)
-----   Message from Melvenia Beam, MD sent at 05/10/2015 10:35 AM EST ----- Labs normal thanks

## 2015-05-10 NOTE — Telephone Encounter (Signed)
Spoke to pt about normal labs. She verbalized understanding.

## 2015-05-14 ENCOUNTER — Ambulatory Visit
Admission: RE | Admit: 2015-05-14 | Discharge: 2015-05-14 | Disposition: A | Discharge status: Home / Self Care | Payer: Medicare Other | Source: Ambulatory Visit | Attending: Neurology | Admitting: Neurology

## 2015-05-14 DIAGNOSIS — R413 Other amnesia: Secondary | ICD-10-CM

## 2015-05-14 DIAGNOSIS — F05 Delirium due to known physiological condition: Secondary | ICD-10-CM

## 2015-05-14 DIAGNOSIS — R410 Disorientation, unspecified: Secondary | ICD-10-CM

## 2015-05-16 DIAGNOSIS — M25561 Pain in right knee: Secondary | ICD-10-CM | POA: Diagnosis not present

## 2015-05-16 DIAGNOSIS — M1712 Unilateral primary osteoarthritis, left knee: Secondary | ICD-10-CM | POA: Diagnosis not present

## 2015-05-16 DIAGNOSIS — M170 Bilateral primary osteoarthritis of knee: Secondary | ICD-10-CM | POA: Diagnosis not present

## 2015-05-16 DIAGNOSIS — M25562 Pain in left knee: Secondary | ICD-10-CM | POA: Diagnosis not present

## 2015-05-16 DIAGNOSIS — R2689 Other abnormalities of gait and mobility: Secondary | ICD-10-CM | POA: Diagnosis not present

## 2015-05-22 ENCOUNTER — Telehealth: Payer: Self-pay | Admitting: Neurology

## 2015-05-22 NOTE — Telephone Encounter (Signed)
I spoke to patient. Discussed findings below. I advised her to have an MRI of the cervical spine, the cervical spondylosis appears severe and can be causing cord compression. She is at risk for significant morbidity or mortality. Patient declined MRi of the cervical spine twice. i asked to speak with her husband and relay the same information and she declined, said she would not like me to discuss with her husband.        IMPRESSION: This MRI of the brain without contrast shows the following: 1. Scattered T2/FLAIR hyperintense foci consistent with chronic microvascular ischemic change. The extent is more than expected for age. When compared to the MRI dated 12/31/2013, there is a new linear focus in the pons not present on the older MRI though the hemispheres appear unchanged. 2. Mild cortical atrophy, unchanged compared to the MRI dated 12/31/2013. 3. Fluid in the right mastoid air cells consistent with mild eustachian tube dysfunction on that side. 4. Severe cervical spondylosis with spinal stenosis noted at C3-C4 due to 6 mm anterolisthesis. If clinically indicated, this would be better evaluated on cervical images.

## 2015-05-24 ENCOUNTER — Other Ambulatory Visit: Payer: Self-pay

## 2015-05-24 DIAGNOSIS — M25561 Pain in right knee: Secondary | ICD-10-CM | POA: Diagnosis not present

## 2015-05-24 DIAGNOSIS — M1711 Unilateral primary osteoarthritis, right knee: Secondary | ICD-10-CM | POA: Diagnosis not present

## 2015-05-24 NOTE — Patient Outreach (Addendum)
[place] [facility]) Care Management  05/24/2015  Marisa [name] 02/19/1941 [id number]   Telephone Assessment   Outreach call to patient. Patient states she is doing well. She is currently still in the bed. She shares that her spouse will be having heart surgery next month. She is concerned about spouse not being able to provide care for her while he is recovering. Patient states that dtr comes over several times a week to assist her with personal care but dtr also works two jobs. Patient inquiring about personal care services in the home. Advised that those services are covered and provided through [facility] which patient does not have. She voices that her income is too high. Patient was interested in list of possible private duty caregivers/sitters.  Conditions: Patient continues with some memory issues. She is a&o during this call. She states that she has bene going to outpatient therapy to get exercise for her legs/knees which is helping. She reports that her pain is controlled. Patient sates that she has noticed that her BP has been elevated at times (in the 170s). She remains on BP meds-no recent med changes per patient. Advised patient to alert MD of elevated BP readings. Patient states she has not completed advance driectives-needs to review and discuss with spouse.  Care Coordination: Patient requesting list of in home support services to assist with her care needs.  Plan: RN CM will contact patient within a month. RN CM will send list of sitters/caregivers for patient. RN CM confirmed that patient is aware of when to seek medical care for changes in condition. RN CM will send educational info to patient on HTN and BP management as well as advance care planning.  Enzo Montgomery, RN,BSN,CCM [place] Management Telephonic Care Management Coordinator Direct Phone: [phone] Toll Free: [phone] Fax: [phone]

## 2015-05-24 NOTE — Telephone Encounter (Signed)
Patient has an appointment Marisa [name] in April. Husband will acompanyher. We can review this cervical findings at that time. thanks

## 2015-05-25 ENCOUNTER — Other Ambulatory Visit: Payer: Self-pay | Admitting: Oncology

## 2015-05-25 ENCOUNTER — Telehealth: Payer: Self-pay

## 2015-05-25 DIAGNOSIS — D500 Iron deficiency anemia secondary to blood loss (chronic): Secondary | ICD-10-CM

## 2015-05-25 NOTE — Telephone Encounter (Signed)
Pt c/o bumps on labia on both sides, for about 3 days, itching, red, not like pimples. She has female PCP and does not want him "looking down there". She has a lab appt at 2 pm tomorrow (2/10) and is asking if we can see her.

## 2015-05-25 NOTE — Telephone Encounter (Signed)
S/w Dr Edwyna Shell and she said for pt to use monistat cream TID for about 1 week. If that does not help to call her PCP and see if there is a female MD or PA that could see her. Called pt back and she was receptive to this plan.

## 2015-05-26 ENCOUNTER — Telehealth: Payer: Self-pay

## 2015-05-26 ENCOUNTER — Telehealth: Payer: Self-pay | Admitting: Oncology

## 2015-05-26 ENCOUNTER — Other Ambulatory Visit (HOSPITAL_BASED_OUTPATIENT_CLINIC_OR_DEPARTMENT_OTHER): Payer: Medicare Other

## 2015-05-26 DIAGNOSIS — D649 Anemia, unspecified: Secondary | ICD-10-CM

## 2015-05-26 DIAGNOSIS — D500 Iron deficiency anemia secondary to blood loss (chronic): Secondary | ICD-10-CM

## 2015-05-26 LAB — CBC WITH DIFFERENTIAL/PLATELET
BASO%: 0.4 % (ref 0.0–2.0)
BASOS ABS: 0 10*3/uL (ref 0.0–0.1)
EOS ABS: 0.2 10*3/uL (ref 0.0–0.5)
EOS%: 1.6 % (ref 0.0–7.0)
HEMATOCRIT: 26.4 % — AB (ref 34.8–46.6)
HEMOGLOBIN: 8.5 g/dL — AB (ref 11.6–15.9)
LYMPH#: 0.4 10*3/uL — AB (ref 0.9–3.3)
LYMPH%: 3.3 % — ABNORMAL LOW (ref 14.0–49.7)
MCH: 28.2 pg (ref 25.1–34.0)
MCHC: 32.3 g/dL (ref 31.5–36.0)
MCV: 87.3 fL (ref 79.5–101.0)
MONO#: 0.7 10*3/uL (ref 0.1–0.9)
MONO%: 6.1 % (ref 0.0–14.0)
NEUT%: 88.6 % — ABNORMAL HIGH (ref 38.4–76.8)
NEUTROS ABS: 10 10*3/uL — AB (ref 1.5–6.5)
PLATELETS: 228 10*3/uL (ref 145–400)
RBC: 3.02 10*6/uL — ABNORMAL LOW (ref 3.70–5.45)
RDW: 13.9 % (ref 11.2–14.5)
WBC: 11.3 10*3/uL — AB (ref 3.9–10.3)

## 2015-05-26 NOTE — Telephone Encounter (Signed)
Needs orders for 1 unit PRBCs at [facility] 2-13 @ 2pm I am glad to put them in, but still not sure I know how to do this for [facility] clinic  She will need premeds as previously, may be tylenol 325 and benadryl 12.5 mg.  1 unit over 2 hrs  thanks

## 2015-05-26 NOTE — Telephone Encounter (Signed)
Hgb is 8.5. S/w patient and she is symptomatic with fatigue and SOB and heart racing at times. s/w Dr Edwyna Shell, s/w infusion room, s/w sickle cell. Sickle cell can transfuse 1 unit blood at 0900 on Tuesday.  Pt will come in on Monday at 2pm for T&C to be drawn. Instructed pt to go to ER if symptoms get worse over the weekend. Orders from blood set not completed.

## 2015-05-26 NOTE — Telephone Encounter (Signed)
Scheduled lab appointment per 2/10 pof. Patient aware per chart note

## 2015-05-29 ENCOUNTER — Other Ambulatory Visit: Payer: Self-pay

## 2015-05-29 ENCOUNTER — Other Ambulatory Visit (HOSPITAL_BASED_OUTPATIENT_CLINIC_OR_DEPARTMENT_OTHER): Payer: Medicare Other

## 2015-05-29 ENCOUNTER — Ambulatory Visit (HOSPITAL_COMMUNITY)
Admission: RE | Admit: 2015-05-29 | Discharge: 2015-05-29 | Disposition: A | Discharge status: Home / Self Care | Payer: Medicare Other | Source: Ambulatory Visit | Attending: Oncology | Admitting: Oncology

## 2015-05-29 DIAGNOSIS — D500 Iron deficiency anemia secondary to blood loss (chronic): Secondary | ICD-10-CM | POA: Insufficient documentation

## 2015-05-29 DIAGNOSIS — D649 Anemia, unspecified: Secondary | ICD-10-CM | POA: Diagnosis present

## 2015-05-29 LAB — CBC WITH DIFFERENTIAL/PLATELET
BASO%: 0.3 % (ref 0.0–2.0)
Basophils Absolute: 0 10*3/uL (ref 0.0–0.1)
EOS ABS: 0.2 10*3/uL (ref 0.0–0.5)
EOS%: 1.9 % (ref 0.0–7.0)
HEMATOCRIT: 26.6 % — AB (ref 34.8–46.6)
HEMOGLOBIN: 8.6 g/dL — AB (ref 11.6–15.9)
LYMPH#: 0.4 10*3/uL — AB (ref 0.9–3.3)
LYMPH%: 3.9 % — ABNORMAL LOW (ref 14.0–49.7)
MCH: 29.2 pg (ref 25.1–34.0)
MCHC: 32.3 g/dL (ref 31.5–36.0)
MCV: 90.2 fL (ref 79.5–101.0)
MONO#: 0.7 10*3/uL (ref 0.1–0.9)
MONO%: 6.7 % (ref 0.0–14.0)
NEUT%: 87.2 % — ABNORMAL HIGH (ref 38.4–76.8)
NEUTROS ABS: 9.6 10*3/uL — AB (ref 1.5–6.5)
PLATELETS: 228 10*3/uL (ref 145–400)
RBC: 2.95 10*6/uL — AB (ref 3.70–5.45)
RDW: 14 % (ref 11.2–14.5)
WBC: 11.1 10*3/uL — AB (ref 3.9–10.3)

## 2015-05-29 LAB — PREPARE RBC (CROSSMATCH)

## 2015-05-30 ENCOUNTER — Ambulatory Visit (HOSPITAL_COMMUNITY)
Admission: RE | Admit: 2015-05-30 | Discharge: 2015-05-30 | Disposition: A | Discharge status: Home / Self Care | Payer: Medicare Other | Source: Ambulatory Visit | Attending: Oncology | Admitting: Oncology

## 2015-05-30 VITALS — BP 155/87 | HR 73 | Temp 98.9°F | Resp 16

## 2015-05-30 DIAGNOSIS — D649 Anemia, unspecified: Secondary | ICD-10-CM

## 2015-05-30 DIAGNOSIS — D500 Iron deficiency anemia secondary to blood loss (chronic): Secondary | ICD-10-CM | POA: Diagnosis not present

## 2015-05-30 LAB — PREPARE RBC (CROSSMATCH)

## 2015-05-30 MED ORDER — ACETAMINOPHEN 325 MG PO TABS
325.0000 mg | ORAL_TABLET | Freq: Once | ORAL | Status: AC
Start: 2015-05-30 — End: 2015-05-30
  Administered 2015-05-30: 325 mg via ORAL
  Filled 2015-05-30: qty 1

## 2015-05-30 MED ORDER — SODIUM CHLORIDE 0.9 % IV SOLN
250.0000 mL | Freq: Once | INTRAVENOUS | Status: AC
  Administered 2015-05-30: 250 mL via INTRAVENOUS

## 2015-05-30 NOTE — Progress Notes (Signed)
Diagnosis Association: Anemia, normocytic normochromic (285.9  MD: Marko Plume  Procedure: Pt received 1 unit of PRBCs  Pt tolerated the procedure well  Post procedure: Pt alert, oriented and ambulatory

## 2015-05-30 NOTE — Telephone Encounter (Signed)
Blood ordered for transfusion today at 1400 at [facility].  Pt did not receive any premeds previously with transfusions. Discussed with Dr. Marko Plume.  She ordered Tylenol 325 mg prior to transfusion.

## 2015-05-31 DIAGNOSIS — M1712 Unilateral primary osteoarthritis, left knee: Secondary | ICD-10-CM | POA: Diagnosis not present

## 2015-05-31 DIAGNOSIS — M170 Bilateral primary osteoarthritis of knee: Secondary | ICD-10-CM | POA: Diagnosis not present

## 2015-05-31 DIAGNOSIS — R2689 Other abnormalities of gait and mobility: Secondary | ICD-10-CM | POA: Diagnosis not present

## 2015-05-31 DIAGNOSIS — M25561 Pain in right knee: Secondary | ICD-10-CM | POA: Diagnosis not present

## 2015-05-31 DIAGNOSIS — M25562 Pain in left knee: Secondary | ICD-10-CM | POA: Diagnosis not present

## 2015-05-31 LAB — TYPE AND SCREEN
ABO/RH(D): O POS
Antibody Screen: NEGATIVE
Unit division: 0

## 2015-06-07 DIAGNOSIS — M25561 Pain in right knee: Secondary | ICD-10-CM | POA: Diagnosis not present

## 2015-06-07 DIAGNOSIS — M1711 Unilateral primary osteoarthritis, right knee: Secondary | ICD-10-CM | POA: Diagnosis not present

## 2015-06-14 DIAGNOSIS — M25562 Pain in left knee: Secondary | ICD-10-CM | POA: Diagnosis not present

## 2015-06-14 DIAGNOSIS — R2689 Other abnormalities of gait and mobility: Secondary | ICD-10-CM | POA: Diagnosis not present

## 2015-06-14 DIAGNOSIS — M1712 Unilateral primary osteoarthritis, left knee: Secondary | ICD-10-CM | POA: Diagnosis not present

## 2015-06-14 DIAGNOSIS — M25561 Pain in right knee: Secondary | ICD-10-CM | POA: Diagnosis not present

## 2015-06-14 DIAGNOSIS — M170 Bilateral primary osteoarthritis of knee: Secondary | ICD-10-CM | POA: Diagnosis not present

## 2015-06-21 ENCOUNTER — Other Ambulatory Visit: Payer: Self-pay

## 2015-06-21 NOTE — Patient Outreach (Signed)
[place] [facility]) Care Management  06/21/2015  SHERIL BRATTAIN 07/16/1940 [id number]   Case Closure   Outreach call to patient. Spoke with patient who states she is doing fairly well. She denies any new issues or concerns. She reports that she will complete 12wks of therapy on tomorrow to help with her knees. She is going to have eye surgery for cataracts sometime in the near future. She states that blood sugars remain controlled. She has supportive spouse who continues to assist with managing her care. Memory remains about the same per patient report. No further CM needs/concerns at this time. Discussed case closure with patient. Advised patient to contact [facility] in the future if needs arise. She verbalized understanding.   Plan: RN CM will notify [facility] administrative assistant of case closure. RN CM will send MD case closure letter.  Enzo Montgomery, RN,BSN,CCM [place] Management Telephonic Care Management Coordinator Direct Phone: [phone] Toll Free: [phone] Fax: [phone]

## 2015-06-22 DIAGNOSIS — M25561 Pain in right knee: Secondary | ICD-10-CM | POA: Diagnosis not present

## 2015-06-22 DIAGNOSIS — R2689 Other abnormalities of gait and mobility: Secondary | ICD-10-CM | POA: Diagnosis not present

## 2015-06-22 DIAGNOSIS — M170 Bilateral primary osteoarthritis of knee: Secondary | ICD-10-CM | POA: Diagnosis not present

## 2015-06-22 DIAGNOSIS — M25562 Pain in left knee: Secondary | ICD-10-CM | POA: Diagnosis not present

## 2015-06-29 DIAGNOSIS — M059 Rheumatoid arthritis with rheumatoid factor, unspecified: Secondary | ICD-10-CM | POA: Diagnosis not present

## 2015-06-29 DIAGNOSIS — I10 Essential (primary) hypertension: Secondary | ICD-10-CM | POA: Diagnosis not present

## 2015-06-29 DIAGNOSIS — E119 Type 2 diabetes mellitus without complications: Secondary | ICD-10-CM | POA: Diagnosis not present

## 2015-07-06 ENCOUNTER — Encounter: Payer: Self-pay | Admitting: *Deleted

## 2015-07-06 NOTE — Progress Notes (Signed)
Faxed completed form by Dr Jaynee Eagles back to [place] RE donepezil and risperidone use in AD. Fax: [phone]. Received confirmation. Sent copy to MR.

## 2015-07-17 DIAGNOSIS — M059 Rheumatoid arthritis with rheumatoid factor, unspecified: Secondary | ICD-10-CM | POA: Diagnosis not present

## 2015-07-17 DIAGNOSIS — E119 Type 2 diabetes mellitus without complications: Secondary | ICD-10-CM | POA: Diagnosis not present

## 2015-07-17 DIAGNOSIS — I10 Essential (primary) hypertension: Secondary | ICD-10-CM | POA: Diagnosis not present

## 2015-08-09 ENCOUNTER — Encounter: Payer: Self-pay | Admitting: Adult Health

## 2015-08-09 ENCOUNTER — Ambulatory Visit (INDEPENDENT_AMBULATORY_CARE_PROVIDER_SITE_OTHER): Payer: Medicare Other | Admitting: Adult Health

## 2015-08-09 VITALS — BP 166/73 | HR 79 | Ht <= 58 in | Wt 111.6 lb

## 2015-08-09 DIAGNOSIS — R413 Other amnesia: Secondary | ICD-10-CM | POA: Diagnosis not present

## 2015-08-09 DIAGNOSIS — M542 Cervicalgia: Secondary | ICD-10-CM

## 2015-08-09 MED ORDER — PREGABALIN 50 MG PO CAPS: 50.0000 mg | ORAL_CAPSULE | Freq: Three times a day (TID) | ORAL | Status: DC

## 2015-08-09 NOTE — Progress Notes (Signed)
PATIENT: Marisa [name] DOB: July 28, 1940  REASON FOR VISIT: follow up- memory loss, neck pain HISTORY FROM: patient  HISTORY OF PRESENT ILLNESS: Marisa [name] is a 75 year old female with a history of memory loss. She returns today for follow-up. At the last visit the patient was started on Aricept and is tolerating it well. Her MRI and blood work was relatively unremarkable. Today she is more focused on pain in her right base of the neck that started 2- 3 weeks ago. Due to her extreme discomfort she has refused to complete the memory test. She does report that she lives at home with her husband. She is able to complete most ADLs independently however at times she may require some assistance. Due to rheumatoid arthritis she primarily uses a wheelchair but can use a walker. Her husband prepares all meals. He also completes all the finances. She reports that her right sided neck pain began 2-3 weeks ago. She describes it as a sharp shooting pain in the right occipital region. The pain is intermittent. However sometimes it is exacerbated by turning the head to the right. Initially she reported that the pain does not radiate down the right arm. However when evaluated by Dr. Krista Blue she states that the pain does radiate down the arm. She also complains of discomfort in the right shoulder. She does report some incontinence with the bowels and bladder. She reports that she does use a heating pad on the neck. She reports that it offers some discomfort. She is also on Lyrica and reports that it also helps the neck. She returns today for an evaluation.   Initial visit 05/09/2015 Marisa [name]): Marisa [name] is a 75 y.o. female here as a referral from Dr. Minna Antis for memory issues. PMHx RA, seizure, chronic anemia, HTN, DM, depressin. She is here with her husband who provides information. She has had deckining memory problems for years. She forgets where she is going in the house, forgets things in the house. That  started years ago. Husband does the bills. They live independently in a home. Husband does the all the cooking, her hands are not sturdy. She does not drive, her car broke down. Husband says that she has many times looked at him and not known who he is, later she recognizes him. Started a few years ago and slowly worsening. She is seeing men who are not in her house. She speaks to them. They have an alarm system, people can't get in. They are not there. Huge decline from 2012, he noticed during periods of medical illness she has worsened even more. He has seen a big change since then. he had a bout of memory issues 3 months ago. She was confused, didn't know where the bathroom was, didn't know her husband. She feels like she is back to her baseline. But she has some meory problems at baseline. She has depression and anxiety that could be contributing. Hallucinations have been worsening in recent years. No FHx of alzheimers.  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 Stable age advanced white matter disease. This likely reflects the sequela of chronic microvascular ischemia. 2. No acute intracranial abnormality or significant interval change.    REVIEW OF SYSTEMS: Out of a complete 14 system review of symptoms, the patient complains only of the following symptoms, and all other reviewed systems are negative.  Appetite change, cough, wheezing, shortness  of breath, choking, cold intolerance, excessive thirst, constipation, nausea, aching muscles, speech difficulty, memory loss  ALLERGIES: Allergies  Allergen Reactions  . Demerol [Meperidine] Other (See Comments)    Hallucinations  . Penicillins Swelling and Other (See Comments)    Throat swelling Has patient had a PCN reaction causing immediate rash, facial/tongue/throat swelling, SOB or lightheadedness with hypotension:  Yes Has patient had a PCN reaction causing severe rash involving mucus membranes or skin necrosis: No Has patient had a PCN reaction that required hospitalization No Has patient had a PCN reaction occurring within the last 10 years: No If all of the above answers are "NO", then may proceed with Cephalosporin use.   . Enbrel [Etanercept] Swelling    Arm swelling   . Humira [Adalimumab] Swelling    Arm swelling   . Demerol [Meperidine] Itching and Rash  . Penicillins Itching and Rash    HOME MEDICATIONS: Outpatient Prescriptions Prior to Visit  Medication Sig Dispense Refill  . albuterol (PROVENTIL) (5 MG/ML) 0.5% nebulizer solution Take 2.5 mg by nebulization every 6 (six) hours as needed for wheezing or shortness of breath (shortness of breath).     . AMBULATORY NON FORMULARY MEDICATION Continuous O2 @@ 2.5-3 LMP    . BD PEN NEEDLE NANO U/F 32G X 4 MM MISC     . Coenzyme Q10 (CO Q 10) 100 MG CAPS Take 100 mg by mouth daily.    Marland Kitchen docusate sodium (COLACE) 100 MG capsule Take 200 mg by mouth daily as needed for mild constipation.     Marland Kitchen donepezil (ARICEPT) 10 MG tablet Take 1 tablet (10 mg total) by mouth at bedtime. 30 tablet 11  . estrogens, conjugated, (PREMARIN) 0.625 MG tablet Take 0.625 mg by mouth daily.     Marland Kitchen FeFum-FePoly-FA-B Cmp-C-Biot (INTEGRA PLUS) CAPS Take 1 capsule by mouth 2 (two) times daily. (Patient taking differently: Take 1 capsule by mouth daily. ) 60 capsule 6  . fluticasone (FLONASE) 50 MCG/ACT nasal spray Place 2 sprays into both nostrils daily as needed for allergies or rhinitis.    Marland Kitchen FREESTYLE LITE test strip TEST 10 TIMES DAILY ICD E11.65  10  . hydrocortisone (ANUSOL-HC) 25 MG suppository Insert one suppository daily as needed for hemorrhoids. 30 suppository 1  . insulin aspart (NOVOLOG) 100 UNIT/ML injection 0-9 Units, Subcutaneous, 3 times daily with meals CBG < 70: implement hypoglycemia protocol CBG 70 - 120: 0 units CBG 121 - 150: 1 unit CBG 151 - 200:  2 units CBG 201 - 250: 3 units CBG 251 - 300: 5 units CBG 301 - 350: 7 units CBG 351 - 400: 9 units CBG > 400: call MD 10 mL 11  . insulin detemir (LEVEMIR) 100 UNIT/ML injection Inject 0.1 mLs (10 Units total) into the skin daily at 12 noon. (Patient taking differently: Inject 18 Units into the skin daily at 12 noon. )    . ipratropium (ATROVENT) 0.06 % nasal spray Place 1 spray into both nostrils daily as needed for rhinitis.   4  . leflunomide (ARAVA) 20 MG tablet Take 20 mg by mouth daily.    Marland Kitchen levalbuterol (XOPENEX HFA) 45 MCG/ACT inhaler Inhale 1-2 puffs into the lungs every 4 (four) hours as needed for wheezing (wheezing).     Marland Kitchen lisinopril (PRINIVIL,ZESTRIL) 20 MG tablet Take 30 mg by mouth daily.    Marland Kitchen LORazepam (ATIVAN) 1 MG tablet Take 1 mg by mouth 3 (three) times daily as needed for anxiety.   2  .  Menthol, Topical Analgesic, (ICY HOT BACK) 5 % PADS Apply 1 patch topically daily as needed (joint pain).    . metoprolol succinate (TOPROL-XL) 100 MG 24 hr tablet Take 100 mg by mouth daily. Take with or immediately following a meal.    . Multiple Vitamin (MULTIVITAMINS PO) Take 1 tablet by mouth. Daily.     . naproxen sodium (ANAPROX) 220 MG tablet Take 220 mg by mouth daily as needed (pain).     . predniSONE (DELTASONE) 10 MG tablet Take 10 mg by mouth daily with breakfast.    . pregabalin (LYRICA) 75 MG capsule Take 75 mg by mouth daily.     . promethazine (PHENERGAN) 12.5 MG tablet Take 12.5 mg by mouth every 6 (six) hours as needed for nausea.    . risperiDONE (RISPERDAL) 0.5 MG tablet Take 0.5 tablets (0.25 mg total) by mouth 2 (two) times daily. 30 tablet 0  . traMADol (ULTRAM) 50 MG tablet Take 1 tablet (50 mg total) by mouth every 12 (twelve) hours as needed for moderate pain. 20 tablet 0  . vitamin C (ASCORBIC ACID) 500 MG tablet Take 500 mg by mouth 2 (two) times daily.    . Vitamin D-Vitamin K (VITAMIN K2-VITAMIN D3 PO) Take 1 tablet by mouth daily.    Marland Kitchen omeprazole (PRILOSEC)  20 MG capsule Take 20 mg by mouth daily. Reported on 08/09/2015     No facility-administered medications prior to visit.    PAST MEDICAL HISTORY: Past Medical History  Diagnosis Date  . Diabetic ketoacidosis ([place]) 11/2010  . Myocardial infarction Marisa [name])     Chest pain s/p normal cath in 08/2004 then NSTEMI during 11/2010 admission, negative Myoview  . Diabetes mellitus type 2, insulin dependent ([place])     initial diagnoses 11/2008  . GERD (gastroesophageal reflux disease)   . Esophageal stricture 06/2004    Dilation 06/2004  . Supraventricular tachycardia ([place])   . history of  pericarditis 12/2002  . Pericardial effusion   . Rheumatoid arthritis(714.0)     On MTX and chronic steroids  . Osteoarthritis   . Asthma     Home 3L O2   . Seizure ([place]) 11/2008  . Rectal bleeding 11/2010    Large hemorrhoids  . Chronic anemia   . History of blood transfusion   . Diverticulosis   . Anemia   . Family history of anesthesia complication     DAUGHTER HAS NAUSEA  . Asthma   . Hypertension   . Diabetes mellitus without complication ([place])   . Arthritis   . Rheumatoid arteritis   . Depression     patient feels depressed at the end of the month  . Oxygen deficiency     PAST SURGICAL HISTORY: Past Surgical History  Procedure Laterality Date  . Cholecystectomy    . Abdominal hysterectomy  1980  . Incise and drain abcess  09/2011    I&D of peri-rectal abcess per Dr Zella Richer.   . Colonoscopy N/A 01/06/2013    Procedure: COLONOSCOPY;  Surgeon: Ladene Artist, MD;  Location: [facility] ENDOSCOPY;  Service: Endoscopy;  Laterality: N/A;  . Esophagogastroduodenoscopy N/A 02/11/2013    Procedure: ESOPHAGOGASTRODUODENOSCOPY (EGD);  Surgeon: Irene Shipper, MD;  Location: [facility] ENDOSCOPY;  Service: Endoscopy;  Laterality: N/A;    FAMILY HISTORY: Family History  Problem Relation Age of Onset  . Heart disease Mother   . Stroke Father   . Dementia Neg Hx     SOCIAL HISTORY: Social History   Social History    .  Marital Status: Married    Spouse Name: Marisa [name]  . Number of Children: 2  . Years of Education: 12+   Occupational History  . Retired    Social History Main Topics  . Smoking status: Never Smoker   . Smokeless tobacco: Not on file  . Alcohol Use: No  . Drug Use: No  . Sexual Activity: Not on file   Other Topics Concern  . Not on file   Social History Narrative   ** Merged History Encounter **       Pt lives at home with her husband and has a son and daughter. She is still ambulatory occasionally using a cane or walker. No toxic habits.    Caffeine use: 1 cup decaf daily         PHYSICAL EXAM  Filed Vitals:   08/09/15 1449  BP: 166/73  Pulse: 79  Height: 4\' 9"  (1.448 m)  Weight: 111 lb 9.6 oz (50.621 kg)   Body mass index is 24.14 kg/(m^2).  Generalized: Well developed, in no acute distress   Neurological examination  Mentation: Alert oriented to time, place, history taking. Follows all commands speech and language fluent Cranial nerve II-XII: Pupils were equal round reactive to light. Extraocular movements were full, visual field were full on confrontational test. Facial sensation and strength were normal. Uvula tongue midline. Head turning and shoulder shrug  were normal and symmetric. Motor: The motor testing reveals 5 over 5 strength of all 4 extremities. Good symmetric motor tone is noted throughout.  Sensory: Sensory testing is intact to soft touch on all 4 extremities. No evidence of extinction is noted.  Coordination: Cerebellar testing reveals good finger-nose-finger and heel-to-shin bilaterally.  Gait and station: Gait is normal. Tandem gait is normal. Romberg is negative. No drift is seen.  Reflexes: Deep tendon reflexes are symmetric and normal bilaterally.   DIAGNOSTIC DATA (LABS, IMAGING, TESTING) - I reviewed patient records, labs, notes, testing and imaging myself where available.  Lab Results  Component Value Date   WBC 11.1* 05/29/2015   HGB  8.6* 05/29/2015   HCT 26.6* 05/29/2015   MCV 90.2 05/29/2015   PLT 228 05/29/2015      Component Value Date/Time   NA 134* 04/08/2015 0515   NA 136 03/28/2014 1543   K 5.0 04/08/2015 0515   K 4.8 03/28/2014 1543   CL 91* 04/08/2015 0515   CO2 29 04/08/2015 0515   CO2 29 03/28/2014 1543   GLUCOSE 508* 04/08/2015 0515   GLUCOSE 195* 03/28/2014 1543   BUN 24* 04/08/2015 0515   BUN 16.8 03/28/2014 1543   CREATININE 1.36* 04/08/2015 0515   CREATININE 1.2* 03/28/2014 1543   CALCIUM 9.0 04/08/2015 0515   CALCIUM 8.5 03/28/2014 1543   PROT 6.5 04/08/2015 0515   PROT 5.8* 03/28/2014 1543   ALBUMIN 3.1* 04/08/2015 0515   ALBUMIN 2.8* 03/28/2014 1543   AST 16 04/08/2015 0515   AST 13 03/28/2014 1543   ALT 17 04/08/2015 0515   ALT 12 03/28/2014 1543   ALKPHOS 243* 04/08/2015 0515   ALKPHOS 153* 03/28/2014 1543   BILITOT 1.3* 04/08/2015 0515   BILITOT 0.30 03/28/2014 1543   GFRNONAA 34* 04/08/2015 0515   GFRAA 66* 04/08/2015 0515   ASSESSMENT AND PLAN 75 y.o. year old female  has a past medical history of Diabetic ketoacidosis ([place]) (11/2010); Myocardial infarction ([place]); Diabetes mellitus type 2, insulin dependent ([place]); GERD (gastroesophageal reflux disease); Esophageal stricture (06/2004); Supraventricular tachycardia ([place]); history of  pericarditis (12/2002);  Pericardial effusion; Rheumatoid arthritis(714.0); Osteoarthritis; Asthma; Seizure ([place]) (11/2008); Rectal bleeding (11/2010); Chronic anemia; History of blood transfusion; Diverticulosis; Anemia; Family history of anesthesia complication; Asthma; Hypertension; Diabetes mellitus without complication ([place]); Arthritis; Rheumatoid arteritis; Depression; and Oxygen deficiency. here with:  1. Memory loss 2. Neck pain  The patient will remain on Aricept for now. We will recheck her memory at the next visit. She refuses to complete the memory test today. The patient is having extreme discomfort at the base of the neck on the right side. I  consulted with Dr. Krista Blue who came in and evaluated the patient. We will complete an MRI of the cervical spine without contrast due to her kidney function. We will increase Lyrica to 50 mg 3 times a day. I explained Lyrica can cause drowsiness and dizziness. She should exercise extreme caution when ambulating if she has the symptoms. Patient verbalized understanding. She will return in 2 months with Dr. Jaynee [name].    Ward Givens, MSN, NP-C 08/09/2015, 3:05 PM [facility] [address], [place], [place] [address] [phone]

## 2015-08-09 NOTE — Patient Instructions (Signed)
Continue Aricept  MRI cervical spine  Increase Lyrica 50 mg three times a day If your symptoms worsen or you develop new symptoms please let us know.

## 2015-08-14 NOTE — Progress Notes (Signed)
I have reviewed and agreed above plan. 

## 2015-08-16 ENCOUNTER — Other Ambulatory Visit: Payer: Self-pay | Admitting: Oncology

## 2015-08-17 ENCOUNTER — Telehealth: Payer: Self-pay | Admitting: Oncology

## 2015-08-17 ENCOUNTER — Ambulatory Visit (HOSPITAL_BASED_OUTPATIENT_CLINIC_OR_DEPARTMENT_OTHER): Payer: Medicare Other | Admitting: Oncology

## 2015-08-17 ENCOUNTER — Other Ambulatory Visit (HOSPITAL_BASED_OUTPATIENT_CLINIC_OR_DEPARTMENT_OTHER): Payer: Medicare Other

## 2015-08-17 ENCOUNTER — Encounter: Payer: Self-pay | Admitting: Oncology

## 2015-08-17 VITALS — BP 143/51 | HR 79 | Temp 98.1°F | Resp 18 | Ht <= 58 in | Wt 112.2 lb

## 2015-08-17 DIAGNOSIS — N183 Chronic kidney disease, stage 3 unspecified: Secondary | ICD-10-CM

## 2015-08-17 DIAGNOSIS — D649 Anemia, unspecified: Secondary | ICD-10-CM

## 2015-08-17 DIAGNOSIS — M069 Rheumatoid arthritis, unspecified: Secondary | ICD-10-CM

## 2015-08-17 LAB — CBC WITH DIFFERENTIAL/PLATELET
BASO%: 0.2 % (ref 0.0–2.0)
BASOS ABS: 0 10*3/uL (ref 0.0–0.1)
EOS ABS: 0.2 10*3/uL (ref 0.0–0.5)
EOS%: 1.5 % (ref 0.0–7.0)
HCT: 27.3 % — ABNORMAL LOW (ref 34.8–46.6)
HGB: 9 g/dL — ABNORMAL LOW (ref 11.6–15.9)
LYMPH%: 3.2 % — AB (ref 14.0–49.7)
MCH: 29.6 pg (ref 25.1–34.0)
MCHC: 33 g/dL (ref 31.5–36.0)
MCV: 89.8 fL (ref 79.5–101.0)
MONO#: 0.6 10*3/uL (ref 0.1–0.9)
MONO%: 4.9 % (ref 0.0–14.0)
NEUT#: 11 10*3/uL — ABNORMAL HIGH (ref 1.5–6.5)
NEUT%: 90.2 % — AB (ref 38.4–76.8)
PLATELETS: 254 10*3/uL (ref 145–400)
RBC: 3.04 10*6/uL — AB (ref 3.70–5.45)
RDW: 14.3 % (ref 11.2–14.5)
WBC: 12.2 10*3/uL — ABNORMAL HIGH (ref 3.9–10.3)
lymph#: 0.4 10*3/uL — ABNORMAL LOW (ref 0.9–3.3)
nRBC: 0 % (ref 0–0)

## 2015-08-17 LAB — IRON AND TIBC
%SAT: 48 % (ref 21–57)
Iron: 103 ug/dL (ref 41–142)
TIBC: 215 ug/dL — ABNORMAL LOW (ref 236–444)
UIBC: 112 ug/dL — AB (ref 120–384)

## 2015-08-17 LAB — FERRITIN: FERRITIN: 311 ng/mL — AB (ref 9–269)

## 2015-08-17 NOTE — Progress Notes (Signed)
OFFICE PROGRESS NOTE   Aug 17, 2015   Physicians: Jani Gravel, M.Fuller Plan, J.Beekman, ([place]), Y.Krista Blue (neurology)  INTERVAL HISTORY:   Patient is seen, together with husband, in follow up of anemia which is multifactorial with chronic disease and intermittent GI bleeding. She was last transfused 1 unit PRBCs on 05-30-15 for hemoglobin 8.5 and symptomatic. She has multiple other significant comorbidities, including RA x 20 years on chronic steroids, DM, asthma, O2 dependence, diabetes on insulin and CKD.   Labs 02-2013 had erythropoietin level 104, B12 normal and folate normal. Iron studies 09-2014: serum iron 98, %sat 41 and ferritin 93. Last feraheme was 10-2013.  She was last hospitalized 05-2014 for right shoulder effusion, acute infection in joint ruled out.   Patient saw Dr Maudie Mercury a month ago and is to see Dr Amil Amen next week.  She is having some evaluation for tender, more prominent swelling posterior neck.  She has had no recent infectious illness. Stools are dark, likely from oral iron, but no gross bleeding. She is SOB with minimal activity as is baseline. Other than the area at cervical spine, she does not seem to have new or different pain. Appetite is at baseline.  Remainder of 10 point Review of Systems unchanged.    Objective:  Vital signs in last 24 hours:  BP 143/51 mmHg  Pulse 79  Temp(Src) 98.1 F (36.7 C) (Oral)  Resp 18  Ht 4\' 9"  (1.448 m)  Wt 112 lb 3.2 oz (50.894 kg)  BMI 24.27 kg/m2  SpO2 99% Weight down from 119 on 06-23-15. Frail, chronically ill appearing lady looks older than stated age, in [facility] with portable O2.  Alert, oriented and appropriate. Cooperative, pleasant   HEENT:PERRL, sclerae not icteric. Oral mucosa moist without lesions, posterior pharynx clear.  Neck supple. No JVD.  Lymphatics:no cervical,supraclavicular adenopathy Resp: diminished BS thruout without wheezes or rales Cardio: regular rate and rhythm. No gallop. GI: soft, nontender including  epigastrium, not distended, no mass or organomegaly. Normally active bowel sounds.  Musculoskeletal/ Extremities: without pitting edema, cords, tenderness. Joint deformities consistent with the RA diagnosis. Firm area to right of lower C spine slightly tender to palpation.  Neuro: speech fluent, moves all extremities Skin without rash, ecchymosis, petechiae   Lab Results:  Results for orders placed or performed in visit on 08/17/15  CBC with Differential/Platelet  Result Value Ref Range   WBC 12.2 (H) 3.9 - 10.3 10e3/uL   NEUT# 11.0 (H) 1.5 - 6.5 10e3/uL   HGB 9.0 (L) 11.6 - 15.9 g/dL   HCT 27.3 (L) 34.8 - 46.6 %   Platelets 254 145 - 400 10e3/uL   MCV 89.8 79.5 - 101.0 fL   MCH 29.6 25.1 - 34.0 pg   MCHC 33.0 31.5 - 36.0 g/dL   RBC 3.04 (L) 3.70 - 5.45 10e6/uL   RDW 14.3 11.2 - 14.5 %   lymph# 0.4 (L) 0.9 - 3.3 10e3/uL   MONO# 0.6 0.1 - 0.9 10e3/uL   Eosinophils Absolute 0.2 0.0 - 0.5 10e3/uL   Basophils Absolute 0.0 0.0 - 0.1 10e3/uL   NEUT% 90.2 (H) 38.4 - 76.8 %   LYMPH% 3.2 (L) 14.0 - 49.7 %   MONO% 4.9 0.0 - 14.0 %   EOS% 1.5 0.0 - 7.0 %   BASO% 0.2 0.0 - 2.0 %   nRBC 0 0 - 0 %  Ferritin  Result Value Ref Range   Ferritin 311 (H) 9 - 269 ng/ml  Iron and TIBC  Result Value  Ref Range   Iron 103 41 - 142 ug/dL   TIBC 215 (L) 236 - 444 ug/dL   UIBC 112 (L) 120 - 384 ug/dL   %SAT 48 21 - 57 %     Studies/Results:  No results found.  Medications: I have reviewed the patient's current medications. We will let her know ok to decrease po iron to 3x weekly  DISCUSSION:  Offered prn follow up at this office now, with PCP to follow CBC, however husband requests continuing to follow here on regular schedule.   Assessment/Plan:  1.Anemia: previously iron deficient likely from hemorrhoidal bleeding x months, multiple blood draws x years and inadequate oral absorption of iron, also chronic disease. Iron studies still ok, will let her know to decrease po iron to 3x weekly.   WIll follow CBC ~ every 2 months, NP 6 months and MD 1 year 2.rheumatoid arthritis x 20 years, on long term daily prednisone followed by Dr Amil Amen.  3. Atrophic gastritis, hx adenomatous colon polyps, bleeding hemorrhoids: followed by Dr Fuller Plan.  4.diabetes on insulin and metformin  5.history of esophageal stricture  6.asthma: on continuous O2 and home nebulizers  7.hx perirectal abscess, resolved with I&D  8.post cholecystectomy and abdominal hysterectomy  9.HTN, hx nonSTEMI  10.hx pericarditis  11.scoliosis and degenerative arthritis in spine + RA.  12.hx single seizure possibly related to DM  13 chronic kidney disease stage 3 14. Deconditioning related to chronic illness 15.chronic pain related to multiple problems 16.acute encephalopathy related to polypharmacy and hypoglycemia 12-2013.  17. Cervical spine discomfort: evaluation in process by other MDs  All questions answered. TIme spent 20 min including >50% counseling and coordination of care. Route PCP, cc Dr Jeri Lager, MD   08/17/2015, 6:51 PM

## 2015-08-17 NOTE — Telephone Encounter (Signed)
per pof to sch pt appt-gave pt copy of avs-Kristen C schedule not made out for 24mths-sch pt w/LL

## 2015-08-19 ENCOUNTER — Ambulatory Visit
Admission: RE | Admit: 2015-08-19 | Discharge: 2015-08-19 | Disposition: A | Discharge status: Home / Self Care | Payer: Medicare Other | Source: Ambulatory Visit | Attending: Adult Health | Admitting: Adult Health

## 2015-08-19 DIAGNOSIS — M542 Cervicalgia: Secondary | ICD-10-CM

## 2015-08-21 ENCOUNTER — Telehealth: Payer: Self-pay

## 2015-08-21 NOTE — Telephone Encounter (Signed)
S/w pt per Dr Edwyna Shell attached message. Pt was happy with iron tests. Pt let us know she had MRI done Saturday. Results found in chart and Dr Edwyna Shell notified

## 2015-08-21 NOTE — Telephone Encounter (Signed)
-----   Message from Gordy Levan, MD sent at 08/19/2015  2:59 PM EDT ----- Please let her know iron tests are in good range now. I would like her to decrease oral iron to one tablet MWF thanks

## 2015-08-22 ENCOUNTER — Telehealth: Payer: Self-pay | Admitting: Adult Health

## 2015-08-22 DIAGNOSIS — M542 Cervicalgia: Secondary | ICD-10-CM

## 2015-08-22 DIAGNOSIS — R937 Abnormal findings on diagnostic imaging of other parts of musculoskeletal system: Secondary | ICD-10-CM

## 2015-08-22 DIAGNOSIS — R9389 Abnormal findings on diagnostic imaging of other specified body structures: Secondary | ICD-10-CM

## 2015-08-22 NOTE — Telephone Encounter (Signed)
I called the patient. After talking with Dr. Jaynee Eagles. We will refer the patient to neurosurgery. The patient is amenable to this. If we are unable to get her an appointment within the next week or so we will proceed with flexion extension x-rays while she waits for an appointment. Patient is amenable to this plan.

## 2015-08-22 NOTE — Telephone Encounter (Signed)
FAXED to [place] [facility] [phone] the referral (along with demog, insurance and ofv note).

## 2015-08-22 NOTE — Telephone Encounter (Signed)
Faxed referral to [place] [facility]  [phone] (confirmed).

## 2015-08-25 NOTE — Telephone Encounter (Signed)
I called [facility] and pt has appt scheduled 08-28-15 at 1530 with Dr. Hal Neer. [phone].

## 2015-08-28 DIAGNOSIS — M4802 Spinal stenosis, cervical region: Secondary | ICD-10-CM | POA: Diagnosis not present

## 2015-09-06 ENCOUNTER — Telehealth: Payer: Self-pay | Admitting: Neurology

## 2015-09-06 DIAGNOSIS — M4712 Other spondylosis with myelopathy, cervical region: Secondary | ICD-10-CM

## 2015-09-06 NOTE — Telephone Encounter (Signed)
I called the patient. The patient has ongoing discomfort in the neck and in the back of the head. 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 Trenton Gammon. She has Ultram to take, but she only takes one twice a day, I have indicated that she may take 1 every 4-6 hours. I will get her set up for an epidural steroid injection of the cervical spine. The patient has had MRI evaluation of the neck that shows multilevel disease.

## 2015-09-07 ENCOUNTER — Telehealth: Payer: Self-pay | Admitting: *Deleted

## 2015-09-07 NOTE — Telephone Encounter (Signed)
AUDYN, HAACK             CID [id number]  Patient SAME                 Pt's Dr Jaynee Eagles        Area Code [id number]     RE CALLER STATED SHE HAS A" BAD HEAD" AND NEEDS      MEDS TO HELP *URGENT PER CALLER                      Disp:Y/N Y If Y = C/B If No Response In 26minutes ============================================================

## 2015-09-07 NOTE — Telephone Encounter (Signed)
I called pt.  She spoke to Dr. Jannifer Franklin last night.  She has taken ultram every 4-6 hours and this has not helped.  She is level 10 with R occipital pain.  No relief from ultram.  She saw Dr. Hal Neer at [facility] and has appt with Dr. Trenton Gammon in July.  I mentioned about the epidural steroid injection and she did not want to do this until seeing Dr. Trenton Gammon.  Another pain medication?     I tried to call Dr. Trenton Gammon and there office opens at 0900.

## 2015-09-07 NOTE — Telephone Encounter (Signed)
Noted. Thanks.

## 2015-09-07 NOTE — Telephone Encounter (Signed)
I spoke to Marisa [name].  She spoke to pt and moved up the appt to 10-04-15 with Dr. Trenton Gammon.   (Dr. Hal Neer retiring next week).  She will let Dr. Hal Neer know about pain.

## 2015-09-07 NOTE — Telephone Encounter (Signed)
I called and spoke to [place] [facility] and [facility] for Marisa [name], with Dr. Hal Neer and Dr. Trenton Gammon office to call back about being seen sooner and some treatment since Dr. Hal Neer saw pt. I relayed to pt and encouraged her to call as well.  She stated she would.

## 2015-09-12 DIAGNOSIS — H2513 Age-related nuclear cataract, bilateral: Secondary | ICD-10-CM | POA: Diagnosis not present

## 2015-09-12 DIAGNOSIS — H2511 Age-related nuclear cataract, right eye: Secondary | ICD-10-CM | POA: Diagnosis not present

## 2015-09-14 ENCOUNTER — Telehealth: Payer: Self-pay

## 2015-09-14 ENCOUNTER — Other Ambulatory Visit (HOSPITAL_BASED_OUTPATIENT_CLINIC_OR_DEPARTMENT_OTHER): Payer: Medicare Other

## 2015-09-14 DIAGNOSIS — D649 Anemia, unspecified: Secondary | ICD-10-CM | POA: Diagnosis present

## 2015-09-14 DIAGNOSIS — D500 Iron deficiency anemia secondary to blood loss (chronic): Secondary | ICD-10-CM

## 2015-09-14 LAB — CBC WITH DIFFERENTIAL/PLATELET
BASO%: 0.5 % (ref 0.0–2.0)
Basophils Absolute: 0 10*3/uL (ref 0.0–0.1)
EOS%: 3.8 % (ref 0.0–7.0)
Eosinophils Absolute: 0.3 10*3/uL (ref 0.0–0.5)
HEMATOCRIT: 26.2 % — AB (ref 34.8–46.6)
HEMOGLOBIN: 8.8 g/dL — AB (ref 11.6–15.9)
LYMPH%: 10.6 % — AB (ref 14.0–49.7)
MCH: 29.4 pg (ref 25.1–34.0)
MCHC: 33.5 g/dL (ref 31.5–36.0)
MCV: 87.7 fL (ref 79.5–101.0)
MONO#: 1.1 10*3/uL — ABNORMAL HIGH (ref 0.1–0.9)
MONO%: 12.6 % (ref 0.0–14.0)
NEUT#: 6.1 10*3/uL (ref 1.5–6.5)
NEUT%: 72.5 % (ref 38.4–76.8)
Platelets: 240 10*3/uL (ref 145–400)
RBC: 2.98 10*6/uL — ABNORMAL LOW (ref 3.70–5.45)
RDW: 13.2 % (ref 11.2–14.5)
WBC: 8.4 10*3/uL (ref 3.9–10.3)
lymph#: 0.9 10*3/uL (ref 0.9–3.3)

## 2015-09-14 NOTE — Telephone Encounter (Signed)
Told Marisa [name] that Marisa [name]'s Hgb is stable at 8.8 per Dr. Marko Plume. Dr Marko Plume said it would be good if Marisa [name] can increase the iron to 1 tab daily from three times a week  if she is able to tolerate it. Marisa [name] is not more SOB then usual.   Keep appointment for repeat lab on 10-19-15 at 1500 as scheduled.  Husband verbalized understanding.

## 2015-09-19 DIAGNOSIS — E119 Type 2 diabetes mellitus without complications: Secondary | ICD-10-CM | POA: Diagnosis not present

## 2015-09-19 DIAGNOSIS — M859 Disorder of bone density and structure, unspecified: Secondary | ICD-10-CM | POA: Diagnosis not present

## 2015-09-19 DIAGNOSIS — I10 Essential (primary) hypertension: Secondary | ICD-10-CM | POA: Diagnosis not present

## 2015-09-19 DIAGNOSIS — M059 Rheumatoid arthritis with rheumatoid factor, unspecified: Secondary | ICD-10-CM | POA: Diagnosis not present

## 2015-09-19 DIAGNOSIS — Z0000 Encounter for general adult medical examination without abnormal findings: Secondary | ICD-10-CM | POA: Diagnosis not present

## 2015-09-25 DIAGNOSIS — M170 Bilateral primary osteoarthritis of knee: Secondary | ICD-10-CM | POA: Diagnosis not present

## 2015-09-25 DIAGNOSIS — R262 Difficulty in walking, not elsewhere classified: Secondary | ICD-10-CM | POA: Diagnosis not present

## 2015-09-25 DIAGNOSIS — M25562 Pain in left knee: Secondary | ICD-10-CM | POA: Diagnosis not present

## 2015-09-25 DIAGNOSIS — M25561 Pain in right knee: Secondary | ICD-10-CM | POA: Diagnosis not present

## 2015-10-02 DIAGNOSIS — H2511 Age-related nuclear cataract, right eye: Secondary | ICD-10-CM | POA: Diagnosis not present

## 2015-10-03 DIAGNOSIS — H2512 Age-related nuclear cataract, left eye: Secondary | ICD-10-CM | POA: Diagnosis not present

## 2015-10-04 DIAGNOSIS — M4802 Spinal stenosis, cervical region: Secondary | ICD-10-CM | POA: Diagnosis not present

## 2015-10-05 DIAGNOSIS — M1711 Unilateral primary osteoarthritis, right knee: Secondary | ICD-10-CM | POA: Diagnosis not present

## 2015-10-05 DIAGNOSIS — M25561 Pain in right knee: Secondary | ICD-10-CM | POA: Diagnosis not present

## 2015-10-19 ENCOUNTER — Ambulatory Visit: Payer: Medicare Other | Admitting: Neurology

## 2015-10-19 ENCOUNTER — Other Ambulatory Visit (HOSPITAL_BASED_OUTPATIENT_CLINIC_OR_DEPARTMENT_OTHER): Payer: Medicare Other

## 2015-10-19 ENCOUNTER — Telehealth: Payer: Self-pay

## 2015-10-19 DIAGNOSIS — D649 Anemia, unspecified: Secondary | ICD-10-CM | POA: Diagnosis present

## 2015-10-19 DIAGNOSIS — D500 Iron deficiency anemia secondary to blood loss (chronic): Secondary | ICD-10-CM

## 2015-10-19 LAB — CBC WITH DIFFERENTIAL/PLATELET
BASO%: 0.6 % (ref 0.0–2.0)
BASOS ABS: 0.1 10*3/uL (ref 0.0–0.1)
EOS ABS: 0 10*3/uL (ref 0.0–0.5)
EOS%: 0.4 % (ref 0.0–7.0)
HCT: 27.4 % — ABNORMAL LOW (ref 34.8–46.6)
HGB: 9.2 g/dL — ABNORMAL LOW (ref 11.6–15.9)
LYMPH%: 3.2 % — AB (ref 14.0–49.7)
MCH: 29.3 pg (ref 25.1–34.0)
MCHC: 33.5 g/dL (ref 31.5–36.0)
MCV: 87.6 fL (ref 79.5–101.0)
MONO#: 0.3 10*3/uL (ref 0.1–0.9)
MONO%: 3.6 % (ref 0.0–14.0)
NEUT#: 8.6 10*3/uL — ABNORMAL HIGH (ref 1.5–6.5)
NEUT%: 92.2 % — AB (ref 38.4–76.8)
PLATELETS: 239 10*3/uL (ref 145–400)
RBC: 3.13 10*6/uL — AB (ref 3.70–5.45)
RDW: 12.9 % (ref 11.2–14.5)
WBC: 9.3 10*3/uL (ref 3.9–10.3)
lymph#: 0.3 10*3/uL — ABNORMAL LOW (ref 0.9–3.3)

## 2015-10-19 NOTE — Telephone Encounter (Signed)
Told Ms. Marisa [name] that her hemoglobin was up to 9.2 today.  She is feeling pretty good.  She is not more SOB then usual.   Told her that Dr. Marko Plume said to keep lab appointment as scheduled for 11-23-15.   Ms Velasques and husband verbalized understanding.

## 2015-10-23 DIAGNOSIS — H2512 Age-related nuclear cataract, left eye: Secondary | ICD-10-CM | POA: Diagnosis not present

## 2015-11-08 ENCOUNTER — Ambulatory Visit: Payer: Medicare Other | Admitting: Neurology

## 2015-11-16 DIAGNOSIS — R0981 Nasal congestion: Secondary | ICD-10-CM | POA: Diagnosis not present

## 2015-11-16 DIAGNOSIS — E119 Type 2 diabetes mellitus without complications: Secondary | ICD-10-CM | POA: Diagnosis not present

## 2015-11-16 DIAGNOSIS — M542 Cervicalgia: Secondary | ICD-10-CM | POA: Diagnosis not present

## 2015-11-16 DIAGNOSIS — J329 Chronic sinusitis, unspecified: Secondary | ICD-10-CM | POA: Diagnosis not present

## 2015-11-20 DIAGNOSIS — M25562 Pain in left knee: Secondary | ICD-10-CM | POA: Diagnosis not present

## 2015-11-20 DIAGNOSIS — M170 Bilateral primary osteoarthritis of knee: Secondary | ICD-10-CM | POA: Diagnosis not present

## 2015-11-20 DIAGNOSIS — M25561 Pain in right knee: Secondary | ICD-10-CM | POA: Diagnosis not present

## 2015-11-23 ENCOUNTER — Telehealth: Payer: Self-pay

## 2015-11-23 ENCOUNTER — Other Ambulatory Visit (HOSPITAL_BASED_OUTPATIENT_CLINIC_OR_DEPARTMENT_OTHER): Payer: Medicare Other

## 2015-11-23 DIAGNOSIS — D649 Anemia, unspecified: Secondary | ICD-10-CM

## 2015-11-23 DIAGNOSIS — D500 Iron deficiency anemia secondary to blood loss (chronic): Secondary | ICD-10-CM

## 2015-11-23 LAB — CBC WITH DIFFERENTIAL/PLATELET
BASO%: 0.3 % (ref 0.0–2.0)
Basophils Absolute: 0 10*3/uL (ref 0.0–0.1)
EOS%: 0.6 % (ref 0.0–7.0)
Eosinophils Absolute: 0.1 10*3/uL (ref 0.0–0.5)
HCT: 27.6 % — ABNORMAL LOW (ref 34.8–46.6)
HEMOGLOBIN: 9 g/dL — AB (ref 11.6–15.9)
LYMPH%: 1.8 % — ABNORMAL LOW (ref 14.0–49.7)
MCH: 28.4 pg (ref 25.1–34.0)
MCHC: 32.6 g/dL (ref 31.5–36.0)
MCV: 87.1 fL (ref 79.5–101.0)
MONO#: 0.3 10*3/uL (ref 0.1–0.9)
MONO%: 3 % (ref 0.0–14.0)
NEUT%: 94.3 % — ABNORMAL HIGH (ref 38.4–76.8)
NEUTROS ABS: 9.4 10*3/uL — AB (ref 1.5–6.5)
Platelets: 218 10*3/uL (ref 145–400)
RBC: 3.17 10*6/uL — ABNORMAL LOW (ref 3.70–5.45)
RDW: 13.2 % (ref 11.2–14.5)
WBC: 10 10*3/uL (ref 3.9–10.3)
lymph#: 0.2 10*3/uL — ABNORMAL LOW (ref 0.9–3.3)

## 2015-11-23 NOTE — Telephone Encounter (Signed)
S/w pt that her hgb was 9.0. Pt states that she feels well and her SOB is unchanged.

## 2015-11-27 DIAGNOSIS — M25561 Pain in right knee: Secondary | ICD-10-CM | POA: Diagnosis not present

## 2015-11-27 DIAGNOSIS — M1711 Unilateral primary osteoarthritis, right knee: Secondary | ICD-10-CM | POA: Diagnosis not present

## 2015-11-29 DIAGNOSIS — M1712 Unilateral primary osteoarthritis, left knee: Secondary | ICD-10-CM | POA: Diagnosis not present

## 2015-11-29 DIAGNOSIS — M25562 Pain in left knee: Secondary | ICD-10-CM | POA: Diagnosis not present

## 2015-12-04 DIAGNOSIS — M545 Low back pain: Secondary | ICD-10-CM | POA: Diagnosis not present

## 2015-12-04 DIAGNOSIS — M25562 Pain in left knee: Secondary | ICD-10-CM | POA: Diagnosis not present

## 2015-12-04 DIAGNOSIS — M0589 Other rheumatoid arthritis with rheumatoid factor of multiple sites: Secondary | ICD-10-CM | POA: Diagnosis not present

## 2015-12-04 DIAGNOSIS — M25561 Pain in right knee: Secondary | ICD-10-CM | POA: Diagnosis not present

## 2015-12-06 ENCOUNTER — Ambulatory Visit (INDEPENDENT_AMBULATORY_CARE_PROVIDER_SITE_OTHER): Payer: Medicare Other | Admitting: Neurology

## 2015-12-06 ENCOUNTER — Encounter: Payer: Self-pay | Admitting: Neurology

## 2015-12-06 VITALS — BP 161/65 | HR 68 | Temp 98.1°F | Ht <= 58 in

## 2015-12-06 DIAGNOSIS — M4802 Spinal stenosis, cervical region: Secondary | ICD-10-CM | POA: Diagnosis not present

## 2015-12-06 DIAGNOSIS — M5030 Other cervical disc degeneration, unspecified cervical region: Secondary | ICD-10-CM | POA: Diagnosis not present

## 2015-12-06 DIAGNOSIS — F0390 Unspecified dementia without behavioral disturbance: Secondary | ICD-10-CM

## 2015-12-06 MED ORDER — PREGABALIN 75 MG PO CAPS: 75.0000 mg | ORAL_CAPSULE | Freq: Three times a day (TID) | ORAL | 5 refills | Status: DC

## 2015-12-06 MED ORDER — PREGABALIN 75 MG PO CAPS: 75.0000 mg | ORAL_CAPSULE | Freq: Two times a day (BID) | ORAL | 5 refills | Status: DC

## 2015-12-06 MED ORDER — TRAMADOL HCL 50 MG PO TABS: 50.0000 mg | ORAL_TABLET | Freq: Three times a day (TID) | ORAL | 3 refills | Status: DC | PRN

## 2015-12-06 MED ORDER — MEMANTINE HCL 5 MG PO TABS: 5.0000 mg | ORAL_TABLET | Freq: Two times a day (BID) | ORAL | 12 refills | Status: DC

## 2015-12-06 NOTE — Progress Notes (Signed)
[id number]    Provider:  Dr Jaynee Eagles Referring Provider: Jani Gravel, MD Primary Care Physician:  Jani Gravel, MD  CC:  Memory loss and neck pain   Interval history 08/2015:  Memory is worsening. She takes tramadol twice a day. She is having significant neck pain. Discussed referral to pain management Dr. Nelva Bush. I will refill her tramadol until then. Discussed MRI of the brain and her significant memory deficits likely dementia. Grandson and Husband in attendance. Reviewed the images of the cervical spine which showed significant degenerative disease. Will refer to Dr. Rolena Infante for second opinion.   IMPRESSION:  This MRI of the brain without contrast shows the following: 1.   Scattered T2/FLAIR hyperintense foci consistent with chronic microvascular ischemic change. The extent is more than expected for age. When compared to the MRI dated 12/31/2013, there is a new linear focus in the pons not present on the older MRI though the hemispheres appear unchanged. 2.   Mild cortical atrophy, unchanged compared to the MRI dated 12/31/2013. 3.  Fluid in the right mastoid air cells consistent with mild eustachian tube dysfunction on that side. 4.   Severe cervical spondylosis with spinal stenosis noted at C3-C4 due to 6 mm anterolisthesis.   If clinically indicated, this would be better evaluated on cervical images.   IMPRESSION:  This MRI of the cervical spine shows very severe degenerative changes as detailed above. The most significant findings are: 1.    2 mm anterolisthesis of C2 upon C3, 6 mm anterolisthesis of C3 upon C4 and 4 mm retrolisthesis of C4 upon C5. To determine stability of the spondylolisthesis, consider obtaining plain cervical spine films with flexion and extension views. 2.   There is mild spinal stenosis at C3-C4, moderate spinal stenosis at C4-C5 and mild spinal stenosis at C6-C7 due to the multilevel degenerative changes. 3.   There are mild to moderate foraminal  narrowing changes at multiple levels that did not appear to be severe enough to lead to nerve root compression. 4.   C5-C6 level appears to be fused, of a degenerative nature.  Megan Millikan update 07/2015:Marisa [name] is a 75 year old female with a history of memory loss. She returns today for follow-up. At the last visit the patient was started on Aricept and is tolerating it well. Her MRI and blood work was relatively unremarkable. Today she is more focused on pain in her right base of the neck that started 2- 3 weeks ago. Due to her extreme discomfort she has refused to complete the memory test. She does report that she lives at home with her husband. She is able to complete most ADLs independently however at times she may require some assistance. Due to rheumatoid arthritis she primarily uses a wheelchair but can use a walker. Her husband prepares all meals. He also completes all the finances. She reports that her right sided neck pain began 2-3 weeks ago. She describes it as a sharp shooting pain in the right occipital region. The pain is intermittent. However sometimes it is exacerbated by turning the head to the right. Initially she reported that the pain does not radiate down the right arm. However when evaluated by Dr. Krista Blue she states that the pain does radiate down the arm. She also complains of discomfort in the right shoulder. She does report some incontinence with the bowels and bladder. She reports that she does use a heating pad on the neck. She reports that it offers some discomfort. She is also  on Lyrica and reports that it also helps the neck. She returns today for an evaluation.  HPI 04/2015:  Marisa [name] is a 75 y.o. female here as a referral from Dr. Minna Antis for memory issues. PMHx RA, seizure, chronic anemia, HTN, DM, depressin. She is here with her husband who provides information. She has had deckining memory problems for years.  She forgets where she is going in the house, forgets  things in the house. That started years ago. Husband does the bills. They live independently in a home. Husband does the all the cooking, her hands are not sturdy. She does not drive, her car broke down. Husband says that she has many times looked at him and not known who he is, later she recognizes him. Started a few years ago and slowly worsening. She is seeing men who are not in her house. She speaks to them. They have an alarm system, people can't get in. They are not there. Huge decline from 2012, he noticed during periods of medical illness she has worsened even more. He has seen a big change since then. he had a bout of memory issues 3 months ago. She was confused, didn't know where the bathroom was, didn't know her husband. She feels like she is back to her baseline. But she has some meory problems at baseline.  She has depression and anxiety that could be contributing. Hallucinations have been worsening in recent years. No FHx of alzheimers.  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 Stable age advanced white matter disease. This likely reflects the sequela of chronic microvascular ischemia. 2. No acute intracranial abnormality or significant interval change.   Review of Systems: Patient complains of symptoms per HPI as well as the following symptoms: depression, anxiety, decreased energy, dizziness, headache, cinfusion, memory loss, insomnia, restless legs, joint pain, joint swelling, aching muscles, runny nose, moles, constipation. Pertinent negatives per HPI. All others negative.    Social History   Social History  . Marital status: Married    Spouse name: [name]  . Number of children: 2  . Years of education: 12+   Occupational History  . Retired    Social History Main Topics  . Smoking status: Never Smoker  . Smokeless tobacco: Never Used  . Alcohol  use No  . Drug use: No  . Sexual activity: Not on file   Other Topics Concern  . Not on file   Social History Narrative   ** Merged History Encounter **       Pt lives at home with her husband and has a son and daughter. She is still ambulatory occasionally using a cane or walker. No toxic habits.    Caffeine use: 1 cup decaf daily       Family History  Problem Relation Age of Onset  . Heart disease Mother   . Stroke Father   . Dementia Neg Hx     Past Medical History:  Diagnosis Date  . Anemia   . Arthritis   . Asthma    Home 3L O2   . Asthma   . Chronic anemia   . Depression    patient feels depressed at the end of the month  . Diabetes mellitus type 2, insulin dependent ([place])    initial diagnoses 11/2008  . Diabetes mellitus without complication ([place])   . Diabetic ketoacidosis ([place]) 11/2010  .  Diverticulosis   . Esophageal stricture 06/2004   Dilation 06/2004  . Family history of anesthesia complication    DAUGHTER HAS NAUSEA  . GERD (gastroesophageal reflux disease)   . history of  pericarditis 12/2002  . History of blood transfusion   . Hypertension   . Myocardial infarction [facility])    Chest pain s/p normal cath in 08/2004 then NSTEMI during 11/2010 admission, negative Myoview  . Osteoarthritis   . Oxygen deficiency   . Pericardial effusion   . Rectal bleeding 11/2010   Large hemorrhoids  . Rheumatoid arteritis   . Rheumatoid arthritis(714.0)    On MTX and chronic steroids  . Seizure ([place]) 11/2008  . Supraventricular tachycardia [facility])     Past Surgical History:  Procedure Laterality Date  . ABDOMINAL HYSTERECTOMY  1980  . CHOLECYSTECTOMY    . COLONOSCOPY N/A 01/06/2013   Procedure: COLONOSCOPY;  Surgeon: Ladene Artist, MD;  Location[facility] ENDOSCOPY;  Service: Endoscopy;  Laterality: N/A;  . ESOPHAGOGASTRODUODENOSCOPY N/A 02/11/2013   Procedure: ESOPHAGOGASTRODUODENOSCOPY (EGD);  Surgeon: Irene Shipper, MD;  Location: [facility] ENDOSCOPY;  Service: Endoscopy;   Laterality: N/A;  . INCISE AND DRAIN ABCESS  09/2011   I&D of peri-rectal abcess per Dr Zella Richer.     Current Outpatient Prescriptions  Medication Sig Dispense Refill  . FeFum-FePoly-FA-B Cmp-C-Biot (INTEGRA PLUS) CAPS Take 1 capsule by mouth 2 (two) times daily. (Patient taking differently: Take 1 capsule by mouth daily. ) 60 capsule 6  . albuterol (PROVENTIL) (5 MG/ML) 0.5% nebulizer solution Take 2.5 mg by nebulization every 6 (six) hours as needed for wheezing or shortness of breath (shortness of breath).     . ALLERGY RELIEF 10 MG tablet Take 10 mg by mouth daily as needed.   6  . AMBULATORY NON FORMULARY MEDICATION Continuous O2 @@ 2.5-3 LMP    . BD PEN NEEDLE NANO U/F 32G X 4 MM MISC     . BILBERRY, VACCINIUM MYRTILLUS, PO Take 250 mg by mouth daily.    . Coenzyme Q10 (CO Q 10) 100 MG CAPS Take 100 mg by mouth daily.    Marland Kitchen docusate sodium (COLACE) 100 MG capsule Take 200 mg by mouth daily as needed for mild constipation.     Marland Kitchen donepezil (ARICEPT) 10 MG tablet Take 1 tablet (10 mg total) by mouth at bedtime. 30 tablet 11  . estrogens, conjugated, (PREMARIN) 0.625 MG tablet Take 0.625 mg by mouth daily.     . fluticasone (FLONASE) 50 MCG/ACT nasal spray Place 2 sprays into both nostrils daily as needed for allergies or rhinitis.    Marland Kitchen FREESTYLE LITE test strip TEST 10 TIMES DAILY ICD E11.65  10  . Glucosamine-Fish Oil-EPA-DHA (GLUCOSAMINE & FISH OIL PO) Take 1,000 mg by mouth daily.    . hydrocortisone (ANUSOL-HC) 25 MG suppository Insert one suppository daily as needed for hemorrhoids. 30 suppository 1  . insulin aspart (NOVOLOG) 100 UNIT/ML injection 0-9 Units, Subcutaneous, 3 times daily with meals CBG < 70: implement hypoglycemia protocol CBG 70 - 120: 0 units CBG 121 - 150: 1 unit CBG 151 - 200: 2 units CBG 201 - 250: 3 units CBG 251 - 300: 5 units CBG 301 - 350: 7 units CBG 351 - 400: 9 units CBG > 400: call MD 10 mL 11  . insulin detemir (LEVEMIR) 100 UNIT/ML injection  Inject 0.1 mLs (10 Units total) into the skin daily at 12 noon. (Patient taking differently: Inject 18 Units into the skin daily at 12  noon. )    . ipratropium (ATROVENT) 0.06 % nasal spray Place 1 spray into both nostrils daily as needed for rhinitis.   4  . leflunomide (ARAVA) 20 MG tablet Take 20 mg by mouth daily.    Marland Kitchen levalbuterol (XOPENEX HFA) 45 MCG/ACT inhaler Inhale 1-2 puffs into the lungs every 4 (four) hours as needed for wheezing (wheezing).     Marland Kitchen lisinopril (PRINIVIL,ZESTRIL) 40 MG tablet Take 40 mg by mouth daily.    Marland Kitchen LORazepam (ATIVAN) 1 MG tablet Take 1 mg by mouth 3 (three) times daily as needed for anxiety.   2  . Menthol, Topical Analgesic, (ICY HOT BACK) 5 % PADS Apply 1 patch topically daily as needed (joint pain).    . metoprolol succinate (TOPROL-XL) 100 MG 24 hr tablet Take 100 mg by mouth daily. Take with or immediately following a meal.    . Multiple Vitamin (MULTIVITAMINS PO) Take 1 tablet by mouth. Daily.     . naproxen sodium (ANAPROX) 220 MG tablet Take 220 mg by mouth daily as needed (pain).     . predniSONE (DELTASONE) 10 MG tablet Take 10 mg by mouth daily with breakfast.    . pregabalin (LYRICA) 50 MG capsule Take 1 capsule (50 mg total) by mouth 3 (three) times daily. 90 capsule 3  . pregabalin (LYRICA) 50 MG capsule Take 50 mg by mouth 3 (three) times daily.    . promethazine (PHENERGAN) 12.5 MG tablet Take 12.5 mg by mouth every 6 (six) hours as needed for nausea.    . promethazine (PHENERGAN) 25 MG tablet Take 1 tablet by mouth 3 (three) times daily as needed.  1  . ranitidine (ZANTAC) 150 MG tablet Take 150 mg by mouth 2 (two) times daily.    . risperiDONE (RISPERDAL) 0.5 MG tablet Take 0.5 tablets (0.25 mg total) by mouth 2 (two) times daily. 30 tablet 0  . traMADol (ULTRAM) 50 MG tablet Take 1 tablet (50 mg total) by mouth every 12 (twelve) hours as needed for moderate pain. 20 tablet 0  . vitamin C (ASCORBIC ACID) 500 MG tablet Take 500 mg by mouth 2  (two) times daily.    . Vitamin D-Vitamin K (VITAMIN K2-VITAMIN D3 PO) Take 1 tablet by mouth daily.     No current facility-administered medications for this visit.     Allergies as of 12/06/2015 - Review Complete 08/17/2015  Allergen Reaction Noted  . Demerol [meperidine] Other (See Comments) 09/29/2011  . Penicillins Swelling and Other (See Comments) 09/29/2011  . Enbrel [etanercept] Swelling 05/27/2014  . Humira [adalimumab] Swelling 05/27/2014  . Demerol [meperidine] Itching and Rash 05/27/2014  . Penicillins Itching and Rash 05/27/2014    Vitals: BP (!) 161/65 (BP Location: Left Arm, Patient Position: Sitting, Cuff Size: Normal)   Pulse 68   Temp 98.1 F (36.7 C) (Oral)   Ht 4\' 9"  (1.448 m)  Last Weight:  Wt Readings from Last 1 Encounters:  08/17/15 112 lb 3.2 oz (50.9 kg)   Last Height:   Ht Readings from Last 1 Encounters:  12/06/15 4\' 9"  (1.448 m)    Cranial Nerves:    The pupils are equal, round, and reactive to light. Attempted fundoscopic exam and could not visualize due to cataracts. Visual fields are full to finger confrontation. Extraocular movements are intact. Trigeminal sensation is intact and the muscles of mastication are normal. The face is symmetric. The palate elevates in the midline. Hearing intact. Voice is normal. Shoulder shrug is normal.  The tongue has normal motion without fasciculations.   Coordination:    No dysmetria  Gait:    Can bear weight, difficult due to knee pain, wide based cautious gait, uses walking aids at home but is in a wheelchair today  Motor Observation:    No asymmetry, no atrophy, and no involuntary movements noted. Tone:    Normal muscle tone.    Posture:    Posture is slightly stooped    Strength: give-way, no focal deficits noted, antigravity in all extremities        Sensation: intact to LT     Reflex Exam:  DTR's: Absent AJs. 1+ uppers. Deferred patellars due to pain     Toes:    The toes are  equivocal bilaterally.   Clonus:    Clonus is absent.      Assessment/Plan:  75 year old with progressive memory loss and hallucinations. MoCA 14/30 c/w significant cognitive decline. Memory changes are likely multifactorial including polypharmacy, depression and anxiety, medical conditions and dementia. Also with signifcant degenerative changes in the cervical spine and constant pain will refer to Dr. Rolena Infante and   Dementia: Continue Aricept. Add namenda  Neck pain: Significant degenerative changes. Refer to pain management as well as Dr. Rolena Infante Discussed side effects as per patient instructions  Sarina Ill, MD  [facility] [address] [place], [place] [address]  Phone [phone] Fax [phone]  A total of 30 minutes was spent face-to-face with this patient. Over half this time was spent on counseling patient on the neck pain and dementia diagnosis and different diagnostic and therapeutic options available.

## 2015-12-06 NOTE — Patient Instructions (Signed)
Overall you are doing fairly well but I do want to suggest a few things today:   Remember to drink plenty of fluid, eat healthy meals and do not skip any meals. Try to eat protein with a every meal and eat a healthy snack such as fruit or nuts in between meals. Try to keep a regular sleep-wake schedule and try to exercise daily, particularly in the form of walking, 20-30 minutes a day, if you can.   We will place a referral for pain management for the neck. Will increase the Lyrica to 75mg  three times a day for pain and give you a refill on your tramadol. Will start Namenda for memory.  I would like to see you back in 6 months, sooner if we need to. Please call us with any interim questions, concerns, problems, updates or refill requests.   Our phone number is [phone]. We also have an after hours call service for urgent matters and there is a physician on-call for urgent questions. For any emergencies you know to call 911 or go to the nearest emergency room  Pregabalin capsules What is this medicine? PREGABALIN (pre GAB a lin) is used to treat nerve pain from diabetes, shingles, spinal cord injury, and fibromyalgia. It is also used to control seizures in epilepsy. This medicine may be used for other purposes; ask your health care provider or pharmacist if you have questions. What should I tell my health care provider before I take this medicine? They need to know if you have any of these conditions: -bleeding problems -heart disease, including heart failure -history of alcohol or drug abuse -kidney disease -suicidal thoughts, plans, or attempt; a previous suicide attempt by you or a family member -an unusual or allergic reaction to pregabalin, gabapentin, other medicines, foods, dyes, or preservatives -pregnant or trying to get pregnant or trying to conceive with your partner -breast-feeding How should I use this medicine? Take this medicine by mouth with a glass of water. Follow the  directions on the prescription label. You can take this medicine with or without food. Take your doses at regular intervals. Do not take your medicine more often than directed. Do not stop taking except on your doctor's advice. A special MedGuide will be given to you by the pharmacist with each prescription and refill. Be sure to read this information carefully each time. Talk to your pediatrician regarding the use of this medicine in children. Special care may be needed. Overdosage: If you think you have taken too much of this medicine contact a poison control center or emergency room at once. NOTE: This medicine is only for you. Do not share this medicine with others. What if I miss a dose? If you miss a dose, take it as soon as you can. If it is almost time for your next dose, take only that dose. Do not take double or extra doses. What may interact with this medicine? -alcohol -certain medicines for blood pressure like captopril, enalapril, or lisinopril -certain medicines for diabetes, like pioglitazone or rosiglitazone -certain medicines for anxiety or sleep -narcotic medicines for pain This list may not describe all possible interactions. Give your health care provider a list of all the medicines, herbs, non-prescription drugs, or dietary supplements you use. Also tell them if you smoke, drink alcohol, or use illegal drugs. Some items may interact with your medicine. What should I watch for while using this medicine? Tell your doctor or healthcare professional if your symptoms do not start to  get better or if they get worse. Visit your doctor or health care professional for regular checks on your progress. Do not stop taking except on your doctor's advice. You may develop a severe reaction. Your doctor will tell you how much medicine to take. Wear a medical identification bracelet or chain if you are taking this medicine for seizures, and carry a card that describes your disease and details of  your medicine and dosage times. You may get drowsy or dizzy. Do not drive, use machinery, or do anything that needs mental alertness until you know how this medicine affects you. Do not stand or sit up quickly, especially if you are an older patient. This reduces the risk of dizzy or fainting spells. Alcohol may interfere with the effect of this medicine. Avoid alcoholic drinks. If you have a heart condition, like congestive heart failure, and notice that you are retaining water and have swelling in your hands or feet, contact your health care provider immediately. The use of this medicine may increase the chance of suicidal thoughts or actions. Pay special attention to how you are responding while on this medicine. Any worsening of mood, or thoughts of suicide or dying should be reported to your health care professional right away. This medicine has caused reduced sperm counts in some men. This may interfere with the ability to father a child. You should talk to your doctor or health care professional if you are concerned about your fertility. Women who become pregnant while using this medicine for seizures may enroll in the [place] Pregnancy Registry by calling 3140222242. This registry collects information about the safety of antiepileptic drug use during pregnancy. What side effects may I notice from receiving this medicine? Side effects that you should report to your doctor or health care professional as soon as possible: -allergic reactions like skin rash, itching or hives, swelling of the face, lips, or tongue -breathing problems -changes in vision -chest pain -confusion -jerking or unusual movements of any part of your body -loss of memory -muscle pain, tenderness, or weakness -suicidal thoughts or other mood changes -swelling of the ankles, feet, hands -unusual bruising or bleeding Side effects that usually do not require medical attention (Report these to  your doctor or health care professional if they continue or are bothersome.): -dizziness -drowsiness -dry mouth -headache -nausea -tremors -trouble sleeping -weight gain This list may not describe all possible side effects. Call your doctor for medical advice about side effects. You may report side effects to FDA at 1-800-FDA-1088. Where should I keep my medicine? Keep out of the reach of children. This medicine can be abused. Keep your medicine in a safe place to protect it from theft. Do not share this medicine with anyone. Selling or giving away this medicine is dangerous and against the law. This medicine may cause accidental overdose and death if it taken by other adults, children, or pets. Mix any unused medicine with a substance like cat litter or coffee grounds. Then throw the medicine away in a sealed container like a sealed bag or a coffee can with a lid. Do not use the medicine after the expiration date. Store at room temperature between 15 and 30 degrees C (59 and 86 degrees F). NOTE: This sheet is a summary. It may not cover all possible information. If you have questions about this medicine, talk to your doctor, pharmacist, or health care provider.    2016, Elsevier/Gold Standard. (2013-05-28 15:38:53)   Memantine Tablets What  is this medicine? MEMANTINE (MEM an teen) is used to treat dementia caused by Alzheimer's disease. This medicine may be used for other purposes; ask your health care provider or pharmacist if you have questions. What should I tell my health care provider before I take this medicine? They need to know if you have any of these conditions: -difficulty passing urine -kidney disease -liver disease -seizures -an unusual or allergic reaction to memantine, other medicines, foods, dyes, or preservatives -pregnant or trying to get pregnant -breast-feeding How should I use this medicine? Take this medicine by mouth with a glass of water. Follow the  directions on the prescription label. You may take this medicine with or without food. Take your doses at regular intervals. Do not take your medicine more often than directed. Continue to take your medicine even if you feel better. Do not stop taking except on the advice of your doctor or health care professional. Talk to your pediatrician regarding the use of this medicine in children. Special care may be needed. Overdosage: If you think you have taken too much of this medicine contact a poison control center or emergency room at once. NOTE: This medicine is only for you. Do not share this medicine with others. What if I miss a dose? If you miss a dose, take it as soon as you can. If it is almost time for your next dose, take only that dose. Do not take double or extra doses. If you do not take your medicine for several days, contact your health care provider. Your dose may need to be changed. What may interact with this medicine? -acetazolamide -amantadine -cimetidine -dextromethorphan -dofetilide -hydrochlorothiazide -ketamine -metformin -methazolamide -quinidine -ranitidine -sodium bicarbonate -triamterene This list may not describe all possible interactions. Give your health care provider a list of all the medicines, herbs, non-prescription drugs, or dietary supplements you use. Also tell them if you smoke, drink alcohol, or use illegal drugs. Some items may interact with your medicine. What should I watch for while using this medicine? Visit your doctor or health care professional for regular checks on your progress. Check with your doctor or health care professional if there is no improvement in your symptoms or if they get worse. You may get drowsy or dizzy. Do not drive, use machinery, or do anything that needs mental alertness until you know how this drug affects you. Do not stand or sit up quickly, especially if you are an older patient. This reduces the risk of dizzy or fainting  spells. Alcohol can make you more drowsy and dizzy. Avoid alcoholic drinks. What side effects may I notice from receiving this medicine? Side effects that you should report to your doctor or health care professional as soon as possible: -allergic reactions like skin rash, itching or hives, swelling of the face, lips, or tongue -agitation or a feeling of restlessness -depressed mood -dizziness -hallucinations -redness, blistering, peeling or loosening of the skin, including inside the mouth -seizures -vomiting Side effects that usually do not require medical attention (report to your doctor or health care professional if they continue or are bothersome): -constipation -diarrhea -headache -nausea -trouble sleeping This list may not describe all possible side effects. Call your doctor for medical advice about side effects. You may report side effects to FDA at 1-800-FDA-1088. Where should I keep my medicine? Keep out of the reach of children. Store at room temperature between 15 degrees and 30 degrees C (59 degrees and 86 degrees F). Throw away any unused medicine  after the expiration date. NOTE: This sheet is a summary. It may not cover all possible information. If you have questions about this medicine, talk to your doctor, pharmacist, or health care provider.    2016, [employer]/Gold Standard. (2013-01-18 14:10:42)   Tramadol tablets What is this medicine? TRAMADOL (TRA ma dole) is a pain reliever. It is used to treat moderate to severe pain in adults. This medicine may be used for other purposes; ask your health care provider or pharmacist if you have questions. What should I tell my health care provider before I take this medicine? They need to know if you have any of these conditions: -brain tumor -depression -drug abuse or addiction -head injury -if you frequently drink alcohol containing drinks -kidney disease or trouble passing urine -liver disease -lung disease, asthma,  or breathing problems -seizures or epilepsy -suicidal thoughts, plans, or attempt; a previous suicide attempt by you or a family member -an unusual or allergic reaction to tramadol, codeine, other medicines, foods, dyes, or preservatives -pregnant or trying to get pregnant -breast-feeding How should I use this medicine? Take this medicine by mouth with a full glass of water. Follow the directions on the prescription label. If the medicine upsets your stomach, take it with food or milk. Do not take more medicine than you are told to take. Talk to your pediatrician regarding the use of this medicine in children. Special care may be needed. Overdosage: If you think you have taken too much of this medicine contact a poison control center or emergency room at once. NOTE: This medicine is only for you. Do not share this medicine with others. What if I miss a dose? If you miss a dose, take it as soon as you can. If it is almost time for your next dose, take only that dose. Do not take double or extra doses. What may interact with this medicine? Do not take this medicine with any of the following medications: -MAOIs like Carbex, Eldepryl, Marplan, Nardil, and Parnate This medicine may also interact with the following medications: -alcohol or medicines that contain alcohol -antihistamines -benzodiazepines -bupropion -carbamazepine or oxcarbazepine -clozapine -cyclobenzaprine -digoxin -furazolidone -linezolid -medicines for depression, anxiety, or psychotic disturbances -medicines for migraine headache like almotriptan, eletriptan, frovatriptan, naratriptan, rizatriptan, sumatriptan, zolmitriptan -medicines for pain like pentazocine, buprenorphine, butorphanol, meperidine, nalbuphine, and propoxyphene -medicines for sleep -muscle relaxants -naltrexone -phenobarbital -phenothiazines like perphenazine, thioridazine, chlorpromazine, mesoridazine, fluphenazine, prochlorperazine, promazine, and  trifluoperazine -procarbazine -warfarin This list may not describe all possible interactions. Give your health care provider a list of all the medicines, herbs, non-prescription drugs, or dietary supplements you use. Also tell them if you smoke, drink alcohol, or use illegal drugs. Some items may interact with your medicine. What should I watch for while using this medicine? Tell your doctor or health care professional if your pain does not go away, if it gets worse, or if you have new or a different type of pain. You may develop tolerance to the medicine. Tolerance means that you will need a higher dose of the medicine for pain relief. Tolerance is normal and is expected if you take this medicine for a long time. Do not suddenly stop taking your medicine because you may develop a severe reaction. Your body becomes used to the medicine. This does NOT mean you are addicted. Addiction is a behavior related to getting and using a drug for a non-medical reason. If you have pain, you have a medical reason to take pain medicine. Your doctor  will tell you how much medicine to take. If your doctor wants you to stop the medicine, the dose will be slowly lowered over time to avoid any side effects. You may get drowsy or dizzy. Do not drive, use machinery, or do anything that needs mental alertness until you know how this medicine affects you. Do not stand or sit up quickly, especially if you are an older patient. This reduces the risk of dizzy or fainting spells. Alcohol can increase or decrease the effects of this medicine. Avoid alcoholic drinks. You may have constipation. Try to have a bowel movement at least every 2 to 3 days. If you do not have a bowel movement for 3 days, call your doctor or health care professional. Your mouth may get dry. Chewing sugarless gum or sucking hard candy, and drinking plenty of water may help. Contact your doctor if the problem does not go away or is severe. What side effects may  I notice from receiving this medicine? Side effects that you should report to your doctor or health care professional as soon as possible: -allergic reactions like skin rash, itching or hives, swelling of the face, lips, or tongue -breathing difficulties, wheezing -confusion -itching -light headedness or fainting spells -redness, blistering, peeling or loosening of the skin, including inside the mouth -seizures Side effects that usually do not require medical attention (report to your doctor or health care professional if they continue or are bothersome): -constipation -dizziness -drowsiness -headache -nausea, vomiting This list may not describe all possible side effects. Call your doctor for medical advice about side effects. You may report side effects to FDA at 1-800-FDA-1088. Where should I keep my medicine? Keep out of the reach of children. This medicine may cause accidental overdose and death if it taken by other adults, children, or pets. Mix any unused medicine with a substance like cat litter or coffee grounds. Then throw the medicine away in a sealed container like a sealed bag or a coffee can with a lid. Do not use the medicine after the expiration date. Store at room temperature between 15 and 30 degrees C (59 and 86 degrees F). NOTE: This sheet is a summary. It may not cover all possible information. If you have questions about this medicine, talk to your doctor, pharmacist, or health care provider.    2016, [employer]vier/[employer] Standard. (2013-05-28 15:42:09)

## 2015-12-11 DIAGNOSIS — H9113 Presbycusis, bilateral: Secondary | ICD-10-CM | POA: Diagnosis not present

## 2015-12-11 DIAGNOSIS — J310 Chronic rhinitis: Secondary | ICD-10-CM | POA: Diagnosis not present

## 2015-12-11 DIAGNOSIS — H6980 Other specified disorders of Eustachian tube, unspecified ear: Secondary | ICD-10-CM | POA: Diagnosis not present

## 2015-12-11 DIAGNOSIS — H903 Sensorineural hearing loss, bilateral: Secondary | ICD-10-CM | POA: Diagnosis not present

## 2015-12-14 DIAGNOSIS — M25561 Pain in right knee: Secondary | ICD-10-CM | POA: Diagnosis not present

## 2015-12-14 DIAGNOSIS — I10 Essential (primary) hypertension: Secondary | ICD-10-CM | POA: Diagnosis not present

## 2015-12-14 DIAGNOSIS — M1711 Unilateral primary osteoarthritis, right knee: Secondary | ICD-10-CM | POA: Diagnosis not present

## 2015-12-14 DIAGNOSIS — E119 Type 2 diabetes mellitus without complications: Secondary | ICD-10-CM | POA: Diagnosis not present

## 2015-12-21 DIAGNOSIS — E119 Type 2 diabetes mellitus without complications: Secondary | ICD-10-CM | POA: Diagnosis not present

## 2015-12-21 DIAGNOSIS — D509 Iron deficiency anemia, unspecified: Secondary | ICD-10-CM | POA: Diagnosis not present

## 2015-12-21 DIAGNOSIS — I10 Essential (primary) hypertension: Secondary | ICD-10-CM | POA: Diagnosis not present

## 2015-12-26 DIAGNOSIS — M25562 Pain in left knee: Secondary | ICD-10-CM | POA: Diagnosis not present

## 2015-12-26 DIAGNOSIS — M1712 Unilateral primary osteoarthritis, left knee: Secondary | ICD-10-CM | POA: Diagnosis not present

## 2015-12-28 ENCOUNTER — Other Ambulatory Visit (HOSPITAL_BASED_OUTPATIENT_CLINIC_OR_DEPARTMENT_OTHER): Payer: Medicare Other

## 2015-12-28 DIAGNOSIS — D500 Iron deficiency anemia secondary to blood loss (chronic): Secondary | ICD-10-CM

## 2015-12-28 DIAGNOSIS — D649 Anemia, unspecified: Secondary | ICD-10-CM | POA: Diagnosis present

## 2015-12-28 LAB — CBC WITH DIFFERENTIAL/PLATELET
BASO%: 0.4 % (ref 0.0–2.0)
BASOS ABS: 0 10*3/uL (ref 0.0–0.1)
EOS ABS: 0.1 10*3/uL (ref 0.0–0.5)
EOS%: 0.6 % (ref 0.0–7.0)
HEMATOCRIT: 28.2 % — AB (ref 34.8–46.6)
HEMOGLOBIN: 9.2 g/dL — AB (ref 11.6–15.9)
LYMPH#: 0.4 10*3/uL — AB (ref 0.9–3.3)
LYMPH%: 3.6 % — ABNORMAL LOW (ref 14.0–49.7)
MCH: 27.9 pg (ref 25.1–34.0)
MCHC: 32.5 g/dL (ref 31.5–36.0)
MCV: 85.8 fL (ref 79.5–101.0)
MONO#: 0.3 10*3/uL (ref 0.1–0.9)
MONO%: 3.3 % (ref 0.0–14.0)
NEUT#: 9 10*3/uL — ABNORMAL HIGH (ref 1.5–6.5)
NEUT%: 92.1 % — AB (ref 38.4–76.8)
PLATELETS: 253 10*3/uL (ref 145–400)
RBC: 3.28 10*6/uL — ABNORMAL LOW (ref 3.70–5.45)
RDW: 13.6 % (ref 11.2–14.5)
WBC: 9.8 10*3/uL (ref 3.9–10.3)

## 2015-12-29 ENCOUNTER — Telehealth: Payer: Self-pay

## 2015-12-29 NOTE — Telephone Encounter (Signed)
Told Marisa [name] that her Hemaglobin was good at 9.2 yesterday.  Dr. Marko Plume is pleased that it is not dropping. Marisa [name] states that she has been feeling preety good for her. Told her that Dr. Marko Plume said to keep appointment for lab as scheduled for 02-01-16 at 3pm.  Marisa [name] verbalized understanding.

## 2016-01-03 DIAGNOSIS — M1711 Unilateral primary osteoarthritis, right knee: Secondary | ICD-10-CM | POA: Diagnosis not present

## 2016-01-03 DIAGNOSIS — M25561 Pain in right knee: Secondary | ICD-10-CM | POA: Diagnosis not present

## 2016-01-08 ENCOUNTER — Emergency Department (HOSPITAL_COMMUNITY): Payer: Medicare Other

## 2016-01-08 ENCOUNTER — Emergency Department (HOSPITAL_COMMUNITY)
Admission: EM | Admit: 2016-01-08 | Discharge: 2016-01-09 | Disposition: A | Discharge status: Home / Self Care | Payer: Medicare Other | Attending: Emergency Medicine | Admitting: Emergency Medicine

## 2016-01-08 ENCOUNTER — Encounter (HOSPITAL_COMMUNITY): Payer: Self-pay

## 2016-01-08 DIAGNOSIS — Z79899 Other long term (current) drug therapy: Secondary | ICD-10-CM | POA: Diagnosis not present

## 2016-01-08 DIAGNOSIS — Y9302 Activity, running: Secondary | ICD-10-CM | POA: Insufficient documentation

## 2016-01-08 DIAGNOSIS — M25561 Pain in right knee: Secondary | ICD-10-CM | POA: Insufficient documentation

## 2016-01-08 DIAGNOSIS — S9002XA Contusion of left ankle, initial encounter: Secondary | ICD-10-CM | POA: Diagnosis not present

## 2016-01-08 DIAGNOSIS — S79912A Unspecified injury of left hip, initial encounter: Secondary | ICD-10-CM | POA: Diagnosis not present

## 2016-01-08 DIAGNOSIS — M25551 Pain in right hip: Secondary | ICD-10-CM | POA: Diagnosis not present

## 2016-01-08 DIAGNOSIS — M25552 Pain in left hip: Secondary | ICD-10-CM | POA: Diagnosis not present

## 2016-01-08 DIAGNOSIS — W19XXXA Unspecified fall, initial encounter: Secondary | ICD-10-CM

## 2016-01-08 DIAGNOSIS — I10 Essential (primary) hypertension: Secondary | ICD-10-CM | POA: Insufficient documentation

## 2016-01-08 DIAGNOSIS — Y999 Unspecified external cause status: Secondary | ICD-10-CM | POA: Diagnosis not present

## 2016-01-08 DIAGNOSIS — K2970 Gastritis, unspecified, without bleeding: Secondary | ICD-10-CM | POA: Diagnosis not present

## 2016-01-08 DIAGNOSIS — W1839XA Other fall on same level, initial encounter: Secondary | ICD-10-CM | POA: Diagnosis not present

## 2016-01-08 DIAGNOSIS — J45909 Unspecified asthma, uncomplicated: Secondary | ICD-10-CM | POA: Diagnosis not present

## 2016-01-08 DIAGNOSIS — Z8522 Personal history of malignant neoplasm of nasal cavities, middle ear, and accessory sinuses: Secondary | ICD-10-CM | POA: Insufficient documentation

## 2016-01-08 DIAGNOSIS — Z794 Long term (current) use of insulin: Secondary | ICD-10-CM | POA: Insufficient documentation

## 2016-01-08 DIAGNOSIS — M25571 Pain in right ankle and joints of right foot: Secondary | ICD-10-CM | POA: Insufficient documentation

## 2016-01-08 DIAGNOSIS — S9001XA Contusion of right ankle, initial encounter: Secondary | ICD-10-CM | POA: Diagnosis not present

## 2016-01-08 DIAGNOSIS — S8992XA Unspecified injury of left lower leg, initial encounter: Secondary | ICD-10-CM | POA: Diagnosis not present

## 2016-01-08 DIAGNOSIS — E119 Type 2 diabetes mellitus without complications: Secondary | ICD-10-CM | POA: Diagnosis not present

## 2016-01-08 DIAGNOSIS — M25562 Pain in left knee: Secondary | ICD-10-CM

## 2016-01-08 DIAGNOSIS — Y92007 Garden or yard of unspecified non-institutional (private) residence as the place of occurrence of the external cause: Secondary | ICD-10-CM | POA: Insufficient documentation

## 2016-01-08 DIAGNOSIS — S99911A Unspecified injury of right ankle, initial encounter: Secondary | ICD-10-CM | POA: Diagnosis not present

## 2016-01-08 DIAGNOSIS — M25572 Pain in left ankle and joints of left foot: Secondary | ICD-10-CM

## 2016-01-08 DIAGNOSIS — M25461 Effusion, right knee: Secondary | ICD-10-CM | POA: Diagnosis not present

## 2016-01-08 DIAGNOSIS — J441 Chronic obstructive pulmonary disease with (acute) exacerbation: Secondary | ICD-10-CM | POA: Diagnosis not present

## 2016-01-08 DIAGNOSIS — M7989 Other specified soft tissue disorders: Secondary | ICD-10-CM | POA: Diagnosis not present

## 2016-01-08 DIAGNOSIS — S8012XA Contusion of left lower leg, initial encounter: Secondary | ICD-10-CM | POA: Diagnosis not present

## 2016-01-08 DIAGNOSIS — R10817 Generalized abdominal tenderness: Secondary | ICD-10-CM | POA: Diagnosis not present

## 2016-01-08 LAB — CBG MONITORING, ED: GLUCOSE-CAPILLARY: 107 mg/dL — AB (ref 65–99)
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
## 2016-01-08 NOTE — ED Notes (Signed)
Patient to radiology.

## 2016-01-08 NOTE — ED Notes (Signed)
Patient is resting comfortably. 

## 2016-01-08 NOTE — ED Provider Notes (Signed)
[facility] DEPT [facility] Provider Note   CSN: [id number] Arrival date & time: 01/08/16  1957     History   Chief Complaint Chief Complaint  Marisa [name] presents with  . Fall    HPI Marisa [name] is a 75 y.o. female.  HPI   Marisa [name] is a 75 year old female with a history of memory loss, DM type II, HTN, MI who presents Marisa emergency department with bilateral lower extremity pain after a fall that occurred roughly 1 hour PTA. Marisa [name], Marisa [name], Marisa [name] are Marisa historians. Marisa [name] states Marisa [name] was talking with Marisa [name] husband and wanted to go outside. Marisa [name] states "it's not his faulty he tried to catch me". Marisa [name] believes Marisa [name] just tripped but Marisa [name] is unsure. Marisa [name] is not sure if Marisa [name] had Marisa [name] head but Marisa [name] does not have a headache or visual changes. Marisa [name] is complaining of bilateral hip, knee, ankle pain that is nonradiating, constant, mild 4/10  With associated bruising to Marisa [name] left lower leg. Marisa [name] denies chest pain, shortness breath, abdominal pain, nausea, vomiting. Marisa [name] states Marisa [name] is acting normal, at Marisa [name] baseline, without confusion.  Past Medical History:  Diagnosis Date  . Anemia   . Arthritis   . Asthma    Home 3L O2   . Asthma   . Chronic anemia   . Depression    Marisa [name] feels depressed at Marisa end of Marisa month  . Diabetes mellitus type 2, insulin dependent ([place])    initial diagnoses 11/2008  . Diabetes mellitus without complication ([place])   . Diabetic ketoacidosis ([place]) 11/2010  . Diverticulosis   . Esophageal stricture 06/2004   Dilation 06/2004  . Family history of anesthesia complication    Marisa [name] HAS NAUSEA  . GERD (gastroesophageal reflux disease)   . history of  pericarditis 12/2002  . History of blood transfusion   . Hypertension   . Myocardial infarction [facility])    Chest pain s/p normal cath in 08/2004 then NSTEMI during 11/2010 admission, negative Myoview  . Osteoarthritis   . Oxygen deficiency   . Pericardial effusion   . Rectal bleeding 11/2010   Large  hemorrhoids  . Rheumatoid arteritis   . Rheumatoid arthritis(714.0)    On MTX and chronic steroids  . Seizure ([place]) 11/2008  . Supraventricular tachycardia [facility])     Marisa [name] Active Problem List   Diagnosis Date Noted  . Memory loss 05/09/2015  . Immunosuppressed status ([place])   . Joint pain   . RA (rheumatoid arthritis) ([place])   . Joint effusion 05/28/2014  . Shoulder effusion 05/28/2014  . Rheumatoid arthritis ([place]) 05/28/2014  . UTI (urinary tract infection) 05/28/2014  . Diabetes mellitus type 2, insulin dependent ([place]) 05/28/2014  . COPD exacerbation ([place]) 05/28/2014  . Symptomatic anemia 04/13/2014  . Diabetes mellitus type 2, controlled ([place]) 04/13/2014  . Rheumatoid arthritis ([place]) 04/13/2014  . Essential hypertension 04/13/2014  . Hypoglycemia 01/04/2014  . Acute encephalopathy 01/04/2014  . Iron deficiency anemia due to chronic blood loss 03/07/2013  . Chronic respiratory failure ([place]) 02/11/2013  . Dyspnea 02/11/2013  . 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 Acute blood loss anemia 02/10/2013  . Gastroenteritis 02/10/2013  . Blood in stool 01/05/2013  . Personal history of colonic polyps 01/05/2013  . Anemia 01/04/2013  . Iron deficiency 02/26/2012  . SOB (shortness of breath) 02/26/2012  . Hyponatremia 02/26/2012  . AKI (acute kidney injury) ([place]) 02/26/2012  . MRSA cellulitis 10/08/2011  . Abscess of buttock, right 10/08/2011  . Sinus tachycardia ([place]) 10/08/2011  .  Protein calorie malnutrition ([place]) 10/08/2011  . SIRS (systemic inflammatory response syndrome) ([place]) 09/29/2011  . Tachycardia 09/29/2011  . Diabetes mellitus type 2, insulin dependent ([place])   . Chronic anemia   . NEOPLASM, MALIGNANT, CARCINOMA, BASAL CELL, NOSE 06/08/2007  . POLYP, COLON 06/08/2007  . HYPOKALEMIA 06/08/2007  . Anemia, normocytic normochromic 06/08/2007  . Anxiety state 06/08/2007  . HYPERTENSION 06/08/2007  . UNSPECIFIED ACUTE PERICARDITIS 06/08/2007  . SINUS ARRHYTHMIA  06/08/2007  . HEMORRHOIDS, INTERNAL 06/08/2007  . ALLERGIC RHINITIS 06/08/2007  . BRONCHITIS 06/08/2007  . ASTHMA 06/08/2007  . COPD 06/08/2007  . ESOPHAGEAL STRICTURE 06/08/2007  . GERD 06/08/2007  . GASTRITIS 06/08/2007  . CONSTIPATION, CHRONIC 06/08/2007  . IRRITABLE BOWEL SYNDROME 06/08/2007  . Rheumatoid arthritis(714.0) 06/08/2007  . SLEEP APNEA 06/08/2007  . HEADACHE, CHRONIC 06/08/2007  . NEOPLASM, MALIGNANT, CARCINOMA, BASAL CELL, NOSE 06/08/2007    Past Surgical History:  Procedure Laterality Date  . ABDOMINAL HYSTERECTOMY  1980  . CHOLECYSTECTOMY    . COLONOSCOPY N/A 01/06/2013   Procedure: COLONOSCOPY;  Surgeon: Ladene Artist, MD;  Location: [facility] ENDOSCOPY;  Service: Endoscopy;  Laterality: N/A;  . ESOPHAGOGASTRODUODENOSCOPY N/A 02/11/2013   Procedure: ESOPHAGOGASTRODUODENOSCOPY (EGD);  Surgeon: Irene Shipper, MD;  Location: [facility] ENDOSCOPY;  Service: Endoscopy;  Laterality: N/A;  . INCISE AND DRAIN ABCESS  09/2011   I&D of peri-rectal abcess per Dr Zella Richer.     OB History    Gravida Para Term Preterm AB Living   0 0 0 0 0     SAB TAB Ectopic Multiple Live Births   0 0 0           Home Medications    Prior to Admission medications   Medication Sig Start Date End Date Taking? Authorizing Provider  albuterol (PROVENTIL) (5 MG/ML) 0.5% nebulizer solution Take 2.5 mg by nebulization every 6 (six) hours as needed for wheezing or shortness of breath (shortness of breath).  10/08/11   Nishant Dhungel, MD  ALLERGY RELIEF 10 MG tablet Take 10 mg by mouth daily as needed.  08/01/15   Historical Provider, MD  AMBULATORY NON FORMULARY MEDICATION Continuous O2 @@ 2.5-3 LMP    Historical Provider, MD  BD PEN NEEDLE NANO U/F 32G X 4 MM MISC  10/13/14   Historical Provider, MD  BILBERRY, VACCINIUM MYRTILLUS, PO Take 250 mg by mouth daily.    Historical Provider, MD  Coenzyme Q10 (CO Q 10) 100 MG CAPS Take 100 mg by mouth daily.    Historical Provider, MD  docusate sodium  (COLACE) 100 MG capsule Take 200 mg by mouth daily as needed for mild constipation.     Historical Provider, MD  donepezil (ARICEPT) 10 MG tablet Take 1 tablet (10 mg total) by mouth at bedtime. 05/09/15   Melvenia Beam, MD  estrogens, conjugated, (PREMARIN) 0.625 MG tablet Take 0.625 mg by mouth daily.     Historical Provider, MD  FeFum-FePoly-FA-B Cmp-C-Biot (INTEGRA PLUS) CAPS Take 1 capsule by mouth 2 (two) times daily. Marisa [name] taking differently: Take 1 capsule by mouth daily.  03/06/15   Lennis Marion Downer, MD  fluticasone (FLONASE) 50 MCG/ACT nasal spray Place 2 sprays into both nostrils daily as needed for allergies or rhinitis.    Historical Provider, MD  FREESTYLE LITE test strip TEST 10 TIMES DAILY ICD E11.65 09/16/14   Historical Provider, MD  Glucosamine-Fish Oil-EPA-DHA (GLUCOSAMINE & FISH OIL PO) Take 1,000 mg by mouth daily.    Historical Provider, MD  insulin aspart (NOVOLOG)  100 UNIT/ML injection 0-9 Units, Subcutaneous, 3 times daily with meals CBG < 70: implement hypoglycemia protocol CBG 70 - 120: 0 units CBG 121 - 150: 1 unit CBG 151 - 200: 2 units CBG 201 - 250: 3 units CBG 251 - 300: 5 units CBG 301 - 350: 7 units CBG 351 - 400: 9 units CBG > 400: call MD 01/11/14   Jonetta Osgood, MD  insulin detemir (LEVEMIR) 100 UNIT/ML injection Inject 0.1 mLs (10 Units total) into Marisa skin daily at 12 noon. Marisa [name] taking differently: Inject 18 Units into Marisa skin daily at 12 noon.  01/12/14   Verlee Monte, MD  ipratropium (ATROVENT) 0.06 % nasal spray Place 1 spray into both nostrils daily as needed for rhinitis.  04/24/14   Historical Provider, MD  leflunomide (ARAVA) 20 MG tablet Take 20 mg by mouth daily.    Historical Provider, MD  levalbuterol [facility] HFA) 45 MCG/ACT inhaler Inhale 1-2 puffs into Marisa lungs every 4 (four) hours as needed for wheezing (wheezing).     Historical Provider, MD  lisinopril (PRINIVIL,ZESTRIL) 40 MG tablet Take 40 mg by mouth daily.    Historical  Provider, MD  LORazepam (ATIVAN) 1 MG tablet Take 1 mg by mouth 3 (three) times daily as needed for anxiety.  03/22/15   Historical Provider, MD  memantine (NAMENDA) 5 MG tablet Take 1 tablet (5 mg total) by mouth 2 (two) times daily. 12/06/15   Melvenia Beam, MD  Menthol, Topical Analgesic, (ICY HOT BACK) 5 % PADS Apply 1 patch topically daily as needed (joint pain).    Historical Provider, MD  metoprolol succinate (TOPROL-XL) 100 MG 24 hr tablet Take 100 mg by mouth daily. Take with or immediately following a meal.    Historical Provider, MD  Multiple Vitamin (MULTIVITAMINS PO) Take 1 tablet by mouth. Daily.     Historical Provider, MD  naproxen sodium (ANAPROX) 220 MG tablet Take 220 mg by mouth daily as needed (pain).     Historical Provider, MD  predniSONE (DELTASONE) 10 MG tablet Take 10 mg by mouth daily with breakfast.    Historical Provider, MD  pregabalin (LYRICA) 75 MG capsule Take 1 capsule (75 mg total) by mouth 3 (three) times daily. 12/06/15   Melvenia Beam, MD  promethazine (PHENERGAN) 12.5 MG tablet Take 12.5 mg by mouth every 6 (six) hours as needed for nausea.    Historical Provider, MD  promethazine (PHENERGAN) 25 MG tablet Take 1 tablet by mouth 3 (three) times daily as needed. 07/31/15   Historical Provider, MD  ranitidine (ZANTAC) 150 MG tablet Take 150 mg by mouth 2 (two) times daily.    Historical Provider, MD  risperiDONE (RISPERDAL) 0.5 MG tablet Take 0.5 tablets (0.25 mg total) by mouth 2 (two) times daily. 01/11/14   Shanker Kristeen Mans, MD  traMADol (ULTRAM) 50 MG tablet Take 1 tablet (50 mg total) by mouth 3 (three) times daily as needed for moderate pain. 12/06/15   Melvenia Beam, MD  vitamin C (ASCORBIC ACID) 500 MG tablet Take 500 mg by mouth 2 (two) times daily.    Historical Provider, MD  Vitamin D-Vitamin K (VITAMIN K2-VITAMIN D3 PO) Take 1 tablet by mouth daily.    Historical Provider, MD    Family History Family History  Problem Relation Age of Onset  . Heart  disease Mother   . Stroke Father   . Dementia Neg Hx     Social History Social History  Substance Use  Topics  . Smoking status: Never Smoker  . Smokeless tobacco: Never Used  . Alcohol use No     Allergies   Demerol [meperidine]; Penicillins; Enbrel [etanercept]; Humira [adalimumab]; Demerol [meperidine]; and Penicillins   Review of Systems Review of Systems  Constitutional: Negative for fever.  Eyes: Negative for visual disturbance.  Respiratory: Negative for shortness of breath.   Cardiovascular: Negative for chest pain and leg swelling.  Gastrointestinal: Negative for abdominal pain, nausea and vomiting.  Musculoskeletal: Positive for arthralgias. Negative for back pain.  Skin: Positive for color change (bruising). Negative for wound.  Neurological: Negative for dizziness, syncope, weakness, numbness and headaches.     Physical Exam Updated Vital Signs BP 180/75 (BP Location: Right Arm)   Pulse 91   Temp 98 F (36.7 C) (Oral)   Resp 18   Ht 4\' 7"  (1.397 m)   Wt 49.9 kg   SpO2 99%   BMI 25.57 kg/m   Physical Exam  Constitutional: Marisa [name] appears well-developed and well-nourished. No distress.  HENT:  Head: Normocephalic and atraumatic.  Eyes: Conjunctivae are normal.  Cardiovascular: Normal rate.   Murmur heard.  Systolic murmur is present  Pulses:      Radial pulses are 2+ on Marisa right side, and 2+ on Marisa left side.       Dorsalis pedis pulses are 2+ on Marisa right side, and 2+ on Marisa left side.  Pulmonary/Chest: Effort normal. No respiratory distress.  Abdominal: Normal appearance and bowel sounds are normal. Marisa [name] exhibits no distension. There is no tenderness.  Musculoskeletal: Normal range of motion.  echymosis noted to left lower leg and right medial ankle, no deformities, edema, erythema noted, Marisa [name] states TTP over both knees and ankles, Marisa [name] able to bear weight on BLE, good ROM, sensation intact, strength 5/5 of plantar flexion and extension, 2+ DP pulses, Marisa [name]  neurovascularly intact distally  Neurological: Marisa [name] is alert. No cranial nerve deficit or sensory deficit. Coordination normal. GCS eye subscore is 4. GCS verbal subscore is 5. GCS motor subscore is 6.  Skin: Skin is warm and dry. Marisa [name] is not diaphoretic.  Psychiatric: Marisa [name] has a normal mood and affect. Marisa [name] behavior is normal.  Nursing note and vitals reviewed.    ED Treatments / Results  Labs (all labs ordered are listed, but only abnormal results are displayed) Labs Reviewed  CBG MONITORING, ED - Abnormal; Notable for Marisa following:       Result Value   Glucose-Capillary 107 (*)    All other components within normal limits  CBG MONITORING, ED    EKG  EKG Interpretation None       Radiology No results found.  Results for orders placed or performed during Marisa hospital encounter of 01/08/16  CBG monitoring, ED  Result Value Ref Range   Glucose-Capillary 107 (H) 65 - 99 mg/dL  CBG monitoring, ED  Result Value Ref Range   Glucose-Capillary 67 65 - 99 mg/dL   Dg Ankle Complete Left  Result Date: 01/09/2016 CLINICAL DATA:  Acute onset of left ankle bruising and pain. Initial encounter. EXAM: LEFT ANKLE COMPLETE - 3+ VIEW COMPARISON:  None. FINDINGS: There is no evidence of fracture or dislocation. Marisa ankle mortise is intact; Marisa interosseous space is within normal limits. No talar tilt or subluxation is seen. A plantar calcaneal spur is noted. Marisa joint spaces are preserved. No significant soft tissue abnormalities are seen. IMPRESSION: No evidence of fracture or dislocation. Electronically Signed   By: Francoise Schaumann.D.  On: 01/09/2016 00:41   Dg Ankle Complete Right  Result Date: 01/09/2016 CLINICAL DATA:  Status post fall, with lateral right ankle swelling. Initial encounter. EXAM: RIGHT ANKLE - COMPLETE 3+ VIEW COMPARISON:  None. FINDINGS: There is no evidence of fracture or dislocation. There is mild chronic deformity of Marisa distal fibula. There is chronic partial collapse  of Marisa subtalar joint, with mild fragmentation at Marisa anterior talus. Marisa ankle mortise is intact; Marisa interosseous space is within normal limits. No talar tilt or subluxation is seen. A small os peroneum is noted. Mild lateral soft tissue swelling is noted. IMPRESSION: 1. No evidence of fracture or dislocation. 2. Chronic partial collapse of Marisa subtalar joint, with mild fragmentation at Marisa anterior talus. Electronically Signed   By: Garald Balding M.D.   On: 01/09/2016 00:43   Dg Knee Complete 4 Views Left  Result Date: 01/09/2016 CLINICAL DATA:  Status post fall, with left knee pain. Initial encounter. EXAM: LEFT KNEE - COMPLETE 4+ VIEW COMPARISON:  None. FINDINGS: A thin osseous fragment is noted at Marisa lateral aspect of Marisa patella. This may reflect remote injury. Marisa joint spaces are preserved. Sclerosis is noted at Marisa femoral condyles bilaterally, with mild cortical irregularity along Marisa lateral compartment. A fabella is noted. No significant joint effusion is seen. Marisa visualized soft tissues are normal in appearance. IMPRESSION: 1. Thin osseous fragment at Marisa lateral aspect of Marisa patella. This may reflect remote injury. No definite evidence for acute fracture. 2. Degenerative change at Marisa femoral condyles bilaterally, with mild cortical irregularity along Marisa lateral compartment. Electronically Signed   By: Garald Balding M.D.   On: 01/09/2016 00:36   Dg Knee Complete 4 Views Right  Result Date: 01/09/2016 CLINICAL DATA:  Status post fall, with right knee pain. Initial encounter. EXAM: RIGHT KNEE - COMPLETE 4+ VIEW COMPARISON:  None. FINDINGS: There is no evidence of fracture or dislocation. There is mild narrowing of Marisa lateral compartment, with cortical irregularity at Marisa lateral femoral condyle and lateral tibial plateau. Marginal osteophytes are seen arising at Marisa medial compartment. A fabella is seen. A small knee joint effusion is noted. Marisa visualized soft tissues are otherwise  unremarkable in appearance. IMPRESSION: 1. No evidence of fracture or dislocation. 2. Small knee joint effusion noted. 3. Mild narrowing of Marisa lateral compartment, with associated cortical irregularity. Electronically Signed   By: Garald Balding M.D.   On: 01/09/2016 00:38   Dg Hips Bilat W Or Wo Pelvis 3-4 Views  Result Date: 01/09/2016 CLINICAL DATA:  Status post fall, with left hip pain. Initial encounter. EXAM: DG HIP (WITH OR WITHOUT PELVIS) 3-4V BILAT COMPARISON:  None. FINDINGS: There is no evidence of fracture or dislocation. Both femoral heads are seated normally within their respective acetabula. Marisa proximal femurs appear intact bilaterally. Degenerative change is noted along Marisa lower lumbar spine. Marisa sacroiliac joints are unremarkable in appearance. There is chronic deformity of Marisa superior and inferior pubic rami bilaterally. Marisa visualized bowel gas pattern is grossly unremarkable in appearance. Scattered phleboliths are noted within Marisa pelvis. IMPRESSION: 1. No evidence of fracture or dislocation. 2. Chronic deformity of Marisa superior and inferior pubic rami bilaterally. Electronically Signed   By: Garald Balding M.D.   On: 01/09/2016 00:45     Procedures Procedures (including critical care time)  Medications Ordered in ED Medications - No data to display   Initial Impression / Assessment and Plan / ED Course  I have reviewed Marisa triage vital signs and  Marisa nursing notes.  Pertinent labs & imaging results that were available during my care of Marisa [name] were reviewed by me and considered in my medical decision making (see chart for details).  Clinical Course    Marisa [name] presents after a fall in Marisa [name] yard. No LOC, no neurological deficits. Marisa [name] complaining of BLE pain. Marisa [name] ambulatory in ED. Xrays reviewed by me revealed no acute bony abnormalities. No abdominal pain on exam no indication for further imaging. Discussed RICE and pain medication. Instructed Marisa [name] to f/u with Marisa [name] PCP in 2  days to be reevaluated. Discussed strict return precautions to Marisa ED. I discussed all of Marisa results with Marisa [name] and family members they have expressed their understanding to Marisa verbal discharge instructions.  Marisa [name] case discussed and Marisa [name] seen by Dr. Zenia Resides who agrees with Marisa above plan.  Final Clinical Impressions(s) / ED Diagnoses   Final diagnoses:  Fall, initial encounter  Contusion of left lower leg, initial encounter  Bilateral hip pain  Bilateral knee pain  Bilateral ankle pain    New Prescriptions Discharge Medication List as of 01/09/2016  1:00 AM       Kalman Drape, PA 01/11/16 1202

## 2016-01-09 DIAGNOSIS — S8012XA Contusion of left lower leg, initial encounter: Secondary | ICD-10-CM | POA: Diagnosis not present

## 2016-01-09 LAB — CBG MONITORING, ED: GLUCOSE-CAPILLARY: 67 mg/dL (ref 65–99)

## 2016-01-09 NOTE — Discharge Instructions (Signed)
Your x-rays are negative for acute fractures or dislocations. Use ice on your contusions as often as you can, 20 minutes on, 20 minutes off and be sure to keep a thin clot between your skin and the ice. Take your home naproxen and tramadol as prescribed as needed for pain. Follow-up with your primary care provider in 2 days to be reevaluated.  Return immediately to the emergency department if he is worsening pain, numbness/timing, weakness, fever, abdominal pain, chest pain, shortness of breath, vomiting, headache, visual changes or any other concerning symptoms.

## 2016-01-09 NOTE — ED Notes (Signed)
Patient resting in bed at this time, family at bedside. Provider at bedside at this time to update patient and family. No needs voiced at this time

## 2016-01-09 NOTE — ED Provider Notes (Signed)
Medical screening examination/treatment/procedure(s) were conducted as a shared visit with non-physician practitioner(s) and myself.  I personally evaluated the patient during the encounter.   EKG Interpretation None     Patient here after a witnessed fall. Has pain to her bilateral lower extremity is. No history of head trauma. X-rays pending and likely will be discharged   Marisa Leigh, MD 01/09/16 [phone]

## 2016-01-09 NOTE — ED Notes (Signed)
Patient and family verbalize understanding of discharge instructions, home care and follow up care. Patient out of department at this time with family. 

## 2016-01-18 ENCOUNTER — Inpatient Hospital Stay (HOSPITAL_COMMUNITY)
Admission: EM | Admit: 2016-01-18 | Discharge: 2016-01-22 | DRG: 184 | Disposition: A | Discharge status: Skilled Nursing Facility | Payer: Medicare Other | Attending: Internal Medicine | Admitting: Internal Medicine

## 2016-01-18 ENCOUNTER — Encounter (HOSPITAL_COMMUNITY): Payer: Self-pay | Admitting: *Deleted

## 2016-01-18 ENCOUNTER — Emergency Department (HOSPITAL_COMMUNITY): Payer: Medicare Other

## 2016-01-18 DIAGNOSIS — D649 Anemia, unspecified: Secondary | ICD-10-CM | POA: Diagnosis not present

## 2016-01-18 DIAGNOSIS — Y92012 Bathroom of single-family (private) house as the place of occurrence of the external cause: Secondary | ICD-10-CM

## 2016-01-18 DIAGNOSIS — Z8249 Family history of ischemic heart disease and other diseases of the circulatory system: Secondary | ICD-10-CM

## 2016-01-18 DIAGNOSIS — E878 Other disorders of electrolyte and fluid balance, not elsewhere classified: Secondary | ICD-10-CM | POA: Diagnosis present

## 2016-01-18 DIAGNOSIS — M069 Rheumatoid arthritis, unspecified: Secondary | ICD-10-CM | POA: Diagnosis present

## 2016-01-18 DIAGNOSIS — E871 Hypo-osmolality and hyponatremia: Secondary | ICD-10-CM | POA: Diagnosis not present

## 2016-01-18 DIAGNOSIS — K219 Gastro-esophageal reflux disease without esophagitis: Secondary | ICD-10-CM | POA: Diagnosis present

## 2016-01-18 DIAGNOSIS — S2239XA Fracture of one rib, unspecified side, initial encounter for closed fracture: Secondary | ICD-10-CM | POA: Diagnosis not present

## 2016-01-18 DIAGNOSIS — E119 Type 2 diabetes mellitus without complications: Secondary | ICD-10-CM | POA: Diagnosis not present

## 2016-01-18 DIAGNOSIS — E11649 Type 2 diabetes mellitus with hypoglycemia without coma: Secondary | ICD-10-CM | POA: Diagnosis present

## 2016-01-18 DIAGNOSIS — Z7952 Long term (current) use of systemic steroids: Secondary | ICD-10-CM

## 2016-01-18 DIAGNOSIS — S2241XA Multiple fractures of ribs, right side, initial encounter for closed fracture: Principal | ICD-10-CM | POA: Diagnosis present

## 2016-01-18 DIAGNOSIS — R0902 Hypoxemia: Secondary | ICD-10-CM

## 2016-01-18 DIAGNOSIS — K5900 Constipation, unspecified: Secondary | ICD-10-CM | POA: Diagnosis present

## 2016-01-18 DIAGNOSIS — E875 Hyperkalemia: Secondary | ICD-10-CM | POA: Diagnosis present

## 2016-01-18 DIAGNOSIS — J449 Chronic obstructive pulmonary disease, unspecified: Secondary | ICD-10-CM | POA: Diagnosis present

## 2016-01-18 DIAGNOSIS — Z7951 Long term (current) use of inhaled steroids: Secondary | ICD-10-CM

## 2016-01-18 DIAGNOSIS — D72829 Elevated white blood cell count, unspecified: Secondary | ICD-10-CM | POA: Diagnosis present

## 2016-01-18 DIAGNOSIS — N183 Chronic kidney disease, stage 3 unspecified: Secondary | ICD-10-CM | POA: Insufficient documentation

## 2016-01-18 DIAGNOSIS — E860 Dehydration: Secondary | ICD-10-CM | POA: Diagnosis present

## 2016-01-18 DIAGNOSIS — R2689 Other abnormalities of gait and mobility: Secondary | ICD-10-CM | POA: Diagnosis not present

## 2016-01-18 DIAGNOSIS — W010XXA Fall on same level from slipping, tripping and stumbling without subsequent striking against object, initial encounter: Secondary | ICD-10-CM | POA: Insufficient documentation

## 2016-01-18 DIAGNOSIS — R0781 Pleurodynia: Secondary | ICD-10-CM | POA: Diagnosis not present

## 2016-01-18 DIAGNOSIS — I129 Hypertensive chronic kidney disease with stage 1 through stage 4 chronic kidney disease, or unspecified chronic kidney disease: Secondary | ICD-10-CM | POA: Diagnosis present

## 2016-01-18 DIAGNOSIS — I252 Old myocardial infarction: Secondary | ICD-10-CM

## 2016-01-18 DIAGNOSIS — R52 Pain, unspecified: Secondary | ICD-10-CM | POA: Diagnosis not present

## 2016-01-18 DIAGNOSIS — I2510 Atherosclerotic heart disease of native coronary artery without angina pectoris: Secondary | ICD-10-CM | POA: Diagnosis present

## 2016-01-18 DIAGNOSIS — S2241XD Multiple fractures of ribs, right side, subsequent encounter for fracture with routine healing: Secondary | ICD-10-CM | POA: Diagnosis not present

## 2016-01-18 DIAGNOSIS — E1122 Type 2 diabetes mellitus with diabetic chronic kidney disease: Secondary | ICD-10-CM | POA: Diagnosis present

## 2016-01-18 DIAGNOSIS — S2249XA Multiple fractures of ribs, unspecified side, initial encounter for closed fracture: Secondary | ICD-10-CM | POA: Diagnosis present

## 2016-01-18 DIAGNOSIS — Z90710 Acquired absence of both cervix and uterus: Secondary | ICD-10-CM

## 2016-01-18 DIAGNOSIS — Z66 Do not resuscitate: Secondary | ICD-10-CM | POA: Diagnosis present

## 2016-01-18 DIAGNOSIS — Z79899 Other long term (current) drug therapy: Secondary | ICD-10-CM

## 2016-01-18 DIAGNOSIS — Z9981 Dependence on supplemental oxygen: Secondary | ICD-10-CM | POA: Diagnosis not present

## 2016-01-18 DIAGNOSIS — Z794 Long term (current) use of insulin: Secondary | ICD-10-CM

## 2016-01-18 DIAGNOSIS — Z823 Family history of stroke: Secondary | ICD-10-CM | POA: Diagnosis not present

## 2016-01-18 DIAGNOSIS — R0602 Shortness of breath: Secondary | ICD-10-CM | POA: Diagnosis not present

## 2016-01-18 DIAGNOSIS — S2231XA Fracture of one rib, right side, initial encounter for closed fracture: Secondary | ICD-10-CM | POA: Diagnosis not present

## 2016-01-18 DIAGNOSIS — E1165 Type 2 diabetes mellitus with hyperglycemia: Secondary | ICD-10-CM | POA: Diagnosis present

## 2016-01-18 DIAGNOSIS — R278 Other lack of coordination: Secondary | ICD-10-CM | POA: Diagnosis not present

## 2016-01-18 DIAGNOSIS — R259 Unspecified abnormal involuntary movements: Secondary | ICD-10-CM | POA: Diagnosis not present

## 2016-01-18 DIAGNOSIS — D539 Nutritional anemia, unspecified: Secondary | ICD-10-CM | POA: Diagnosis not present

## 2016-01-18 LAB — COMPREHENSIVE METABOLIC PANEL
ALT: 14 U/L (ref 14–54)
ANION GAP: 13 (ref 5–15)
AST: 19 U/L (ref 15–41)
Albumin: 3.2 g/dL — ABNORMAL LOW (ref 3.5–5.0)
Alkaline Phosphatase: 75 U/L (ref 38–126)
BUN: 25 mg/dL — AB (ref 6–20)
CALCIUM: 8 mg/dL — AB (ref 8.9–10.3)
CO2: 24 mmol/L (ref 22–32)
Chloride: 82 mmol/L — ABNORMAL LOW (ref 101–111)
Creatinine, Ser: 1.39 mg/dL — ABNORMAL HIGH (ref 0.44–1.00)
GFR, EST AFRICAN AMERICAN: 42 mL/min — AB (ref >60)
GFR, EST NON AFRICAN AMERICAN: 36 mL/min — AB (ref >60)
GLUCOSE: 312 mg/dL — AB (ref 65–99)
Potassium: 5.5 mmol/L — ABNORMAL HIGH (ref 3.5–5.1)
SODIUM: 119 mmol/L — AB (ref 135–145)
TOTAL PROTEIN: 5.7 g/dL — AB (ref 6.5–8.1)
Total Bilirubin: 1.5 mg/dL — ABNORMAL HIGH (ref 0.3–1.2)

## 2016-01-18 LAB — URINALYSIS, ROUTINE W REFLEX MICROSCOPIC
Bilirubin Urine: NEGATIVE
Glucose, UA: 100 mg/dL — AB
HGB URINE DIPSTICK: NEGATIVE
KETONES UR: 15 mg/dL — AB
Leukocytes, UA: NEGATIVE
Nitrite: NEGATIVE
PH: 5.5 (ref 5.0–8.0)
PROTEIN: 100 mg/dL — AB
Specific Gravity, Urine: 1.014 (ref 1.005–1.030)

## 2016-01-18 LAB — CBC
HCT: 24.7 % — ABNORMAL LOW (ref 36.0–46.0)
Hemoglobin: 8.4 g/dL — ABNORMAL LOW (ref 12.0–15.0)
MCH: 29.1 pg (ref 26.0–34.0)
MCHC: 34 g/dL (ref 30.0–36.0)
MCV: 85.5 fL (ref 78.0–100.0)
PLATELETS: 279 10*3/uL (ref 150–400)
RBC: 2.89 MIL/uL — ABNORMAL LOW (ref 3.87–5.11)
RDW: 13.8 % (ref 11.5–15.5)
WBC: 12.1 10*3/uL — ABNORMAL HIGH (ref 4.0–10.5)

## 2016-01-18 LAB — URINE MICROSCOPIC-ADD ON

## 2016-01-18 LAB — OSMOLALITY: OSMOLALITY: 277 mosm/kg (ref 275–295)

## 2016-01-18 LAB — TSH: TSH: 1.657 u[IU]/mL (ref 0.350–4.500)

## 2016-01-18 LAB — OSMOLALITY, URINE: OSMOLALITY UR: 287 mosm/kg — AB (ref 300–900)

## 2016-01-18 LAB — SODIUM, URINE, RANDOM: Sodium, Ur: 11 mmol/L

## 2016-01-18 LAB — CBG MONITORING, ED: GLUCOSE-CAPILLARY: 327 mg/dL — AB (ref 65–99)

## 2016-01-18 LAB — GLUCOSE, CAPILLARY: GLUCOSE-CAPILLARY: 363 mg/dL — AB (ref 65–99)

## 2016-01-18 MED ORDER — MEMANTINE HCL 5 MG PO TABS: 5.0000 mg | ORAL_TABLET | Freq: Two times a day (BID) | ORAL | Status: DC

## 2016-01-18 MED ORDER — INSULIN ASPART 100 UNIT/ML ~~LOC~~ SOLN
5.0000 [IU] | Freq: Once | SUBCUTANEOUS | Status: AC
  Administered 2016-01-18: 5 [IU] via SUBCUTANEOUS

## 2016-01-18 MED ORDER — HYDROCODONE-ACETAMINOPHEN 5-325 MG PO TABS
1.0000 | ORAL_TABLET | ORAL | Status: DC | PRN
  Administered 2016-01-18 – 2016-01-19 (×2): 2 via ORAL
  Filled 2016-01-18 (×2): qty 2

## 2016-01-18 MED ORDER — LEFLUNOMIDE 20 MG PO TABS
20.0000 mg | ORAL_TABLET | Freq: Every day | ORAL | Status: DC
  Administered 2016-01-19 – 2016-01-22 (×4): 20 mg via ORAL
  Filled 2016-01-18 (×5): qty 1

## 2016-01-18 MED ORDER — FAMOTIDINE 20 MG PO TABS
10.0000 mg | ORAL_TABLET | Freq: Two times a day (BID) | ORAL | Status: DC
  Administered 2016-01-18 – 2016-01-22 (×8): 10 mg via ORAL
  Filled 2016-01-18 (×8): qty 1

## 2016-01-18 MED ORDER — LORAZEPAM 1 MG PO TABS
1.0000 mg | ORAL_TABLET | Freq: Three times a day (TID) | ORAL | Status: DC | PRN
  Administered 2016-01-18 – 2016-01-22 (×4): 1 mg via ORAL
  Filled 2016-01-18 (×4): qty 1

## 2016-01-18 MED ORDER — DONEPEZIL HCL 10 MG PO TABS
10.0000 mg | ORAL_TABLET | Freq: Every day | ORAL | Status: DC
  Administered 2016-01-18 – 2016-01-21 (×4): 10 mg via ORAL
  Filled 2016-01-18 (×4): qty 1

## 2016-01-18 MED ORDER — INSULIN ASPART 100 UNIT/ML ~~LOC~~ SOLN
0.0000 [IU] | Freq: Three times a day (TID) | SUBCUTANEOUS | Status: DC
  Administered 2016-01-19: 3 [IU] via SUBCUTANEOUS
  Administered 2016-01-19: 7 [IU] via SUBCUTANEOUS
  Administered 2016-01-19: 3 [IU] via SUBCUTANEOUS
  Administered 2016-01-20 – 2016-01-21 (×2): 9 [IU] via SUBCUTANEOUS
  Administered 2016-01-21 (×2): 2 [IU] via SUBCUTANEOUS
  Administered 2016-01-22: 1 [IU] via SUBCUTANEOUS
  Administered 2016-01-22: 3 [IU] via SUBCUTANEOUS

## 2016-01-18 MED ORDER — RISPERIDONE 0.5 MG PO TABS
0.2500 mg | ORAL_TABLET | Freq: Two times a day (BID) | ORAL | Status: DC
  Administered 2016-01-18 – 2016-01-22 (×8): 0.25 mg via ORAL
  Filled 2016-01-18 (×8): qty 1

## 2016-01-18 MED ORDER — ENOXAPARIN SODIUM 40 MG/0.4ML ~~LOC~~ SOLN: 40.0000 mg | SUBCUTANEOUS | Status: DC

## 2016-01-18 MED ORDER — MORPHINE SULFATE (PF) 2 MG/ML IV SOLN
2.0000 mg | Freq: Once | INTRAVENOUS | Status: AC
  Administered 2016-01-18: 2 mg via INTRAVENOUS
  Filled 2016-01-18: qty 1

## 2016-01-18 MED ORDER — INSULIN DETEMIR 100 UNIT/ML ~~LOC~~ SOLN
18.0000 [IU] | Freq: Every day | SUBCUTANEOUS | Status: DC
  Administered 2016-01-19: 18 [IU] via SUBCUTANEOUS
  Filled 2016-01-18 (×2): qty 0.18

## 2016-01-18 MED ORDER — SODIUM CHLORIDE 0.9 % IV SOLN
INTRAVENOUS | Status: AC
  Administered 2016-01-18: 14:00:00 via INTRAVENOUS

## 2016-01-18 MED ORDER — ENOXAPARIN SODIUM 30 MG/0.3ML ~~LOC~~ SOLN
30.0000 mg | Freq: Every morning | SUBCUTANEOUS | Status: AC
  Administered 2016-01-19: 30 mg via SUBCUTANEOUS
  Filled 2016-01-18: qty 0.3

## 2016-01-18 MED ORDER — ONDANSETRON HCL 4 MG PO TABS: 4.0000 mg | ORAL_TABLET | Freq: Four times a day (QID) | ORAL | Status: DC | PRN

## 2016-01-18 MED ORDER — ONDANSETRON HCL 4 MG/2ML IJ SOLN
4.0000 mg | Freq: Once | INTRAMUSCULAR | Status: AC
  Administered 2016-01-18: 4 mg via INTRAVENOUS
  Filled 2016-01-18: qty 2

## 2016-01-18 MED ORDER — METOPROLOL SUCCINATE ER 100 MG PO TB24
100.0000 mg | ORAL_TABLET | Freq: Every day | ORAL | Status: DC
  Administered 2016-01-19 – 2016-01-22 (×4): 100 mg via ORAL
  Filled 2016-01-18 (×5): qty 1

## 2016-01-18 MED ORDER — DOCUSATE SODIUM 100 MG PO CAPS: 200.0000 mg | ORAL_CAPSULE | Freq: Every day | ORAL | Status: DC | PRN

## 2016-01-18 MED ORDER — PREDNISONE 10 MG PO TABS
10.0000 mg | ORAL_TABLET | Freq: Every day | ORAL | Status: DC
  Administered 2016-01-19 – 2016-01-22 (×4): 10 mg via ORAL
  Filled 2016-01-18 (×4): qty 1

## 2016-01-18 MED ORDER — ALBUTEROL SULFATE (2.5 MG/3ML) 0.083% IN NEBU
2.5000 mg | INHALATION_SOLUTION | Freq: Four times a day (QID) | RESPIRATORY_TRACT | Status: DC | PRN
  Filled 2016-01-18: qty 3

## 2016-01-18 MED ORDER — IPRATROPIUM-ALBUTEROL 0.5-2.5 (3) MG/3ML IN SOLN
3.0000 mL | Freq: Four times a day (QID) | RESPIRATORY_TRACT | Status: DC | PRN
  Administered 2016-01-20 – 2016-01-21 (×2): 3 mL via RESPIRATORY_TRACT
  Filled 2016-01-18 (×2): qty 3

## 2016-01-18 MED ORDER — PREGABALIN 75 MG PO CAPS
75.0000 mg | ORAL_CAPSULE | Freq: Three times a day (TID) | ORAL | Status: DC
  Administered 2016-01-18 – 2016-01-22 (×11): 75 mg via ORAL
  Filled 2016-01-18 (×11): qty 1

## 2016-01-18 MED ORDER — SODIUM POLYSTYRENE SULFONATE 15 GM/60ML PO SUSP
30.0000 g | Freq: Once | ORAL | Status: DC
  Filled 2016-01-18: qty 120

## 2016-01-18 MED ORDER — ONDANSETRON HCL 4 MG/2ML IJ SOLN: 4.0000 mg | Freq: Four times a day (QID) | INTRAMUSCULAR | Status: DC | PRN

## 2016-01-18 NOTE — Progress Notes (Signed)
Pt admitted to the unit. Pt is stable, alert and oriented per baseline. Husband oriented to room, staff, and call bell. Educated to call for any assistance. Bed in lowest position, call bell within reach- will continue to monitor.

## 2016-01-18 NOTE — Consult Note (Signed)
[facility] Surgery Consult Note  Marisa [name] Jan 18, 1941  [id number].    Requesting MD: Ashok Cordia, MD Chief Complaint/Reason for Consult: Fall HPI:  75 y/o female with multiple medical problems including, but not limited to, falls, HTN, COPD on 2L home O2, anemia, diabetes mellitus, SVT, GERD, PMH MI, and SVT who presented to [facility] long ED c/o pain over her right ribcage. Patient reports falling in the bathroom 2 days ago when standing up from the toilet. Reports losing her balance and landing on her right side against the bathtub. She denies hitting her head, LOC, dizziness, or near-syncope. States the pain is worse with movement and deep breathing. Tried taking aleve, lorazepam, and lyrica at home with no relief of symptoms.At baseline, the patient lives at home with her husband and ambulates with a cane. She denies HA, vision changes, SOB, abdominal pain, nausea, vomiting, and numbness/weakness. She denies the use of blood thinning medications.   ED workup:  DG ribs unilateral: multiple right rib fractures WBC - 12.1 Hgb/Hct - 8.4, 24.7 Sodium - 119 Potassium - 5.5 BUN/SCr - 25/1.39 Glucose - 312   Of note, patient last seen in ED 01/08/16 after a fall in her yard resulting in BL lower extremity pain and bruising. X-rays negative for fractures/dislocations.  ROS: All systems reviewed and otherwise negative except for as above  Family History  Problem Relation Age of Onset  . Heart disease Mother   . Stroke Father   . Dementia Neg Hx     Past Medical History:  Diagnosis Date  . Anemia   . Arthritis   . Asthma    Home 3L O2   . Asthma   . Chronic anemia   . Depression    patient feels depressed at the end of the month  . Diabetes mellitus type 2, insulin dependent ([place])    initial diagnoses 11/2008  . Diabetes mellitus without complication ([place])   . Diabetic ketoacidosis ([place]) 11/2010  . Diverticulosis   . Esophageal stricture 06/2004   Dilation 06/2004  . Family  history of anesthesia complication    DAUGHTER HAS NAUSEA  . GERD (gastroesophageal reflux disease)   . history of  pericarditis 12/2002  . History of blood transfusion   . Hypertension   . Myocardial infarction    Chest pain s/p normal cath in 08/2004 then NSTEMI during 11/2010 admission, negative Myoview  . Osteoarthritis   . Oxygen deficiency   . Pericardial effusion   . Rectal bleeding 11/2010   Large hemorrhoids  . Rheumatoid arteritis   . Rheumatoid arthritis(714.0)    On MTX and chronic steroids  . Seizure ([place]) 11/2008  . Supraventricular tachycardia [facility])     Past Surgical History:  Procedure Laterality Date  . ABDOMINAL HYSTERECTOMY  1980  . CHOLECYSTECTOMY    . COLONOSCOPY N/A 01/06/2013   Procedure: COLONOSCOPY;  Surgeon: Ladene Artist, MD;  Location: [facility] ENDOSCOPY;  Service: Endoscopy;  Laterality: N/A;  . ESOPHAGOGASTRODUODENOSCOPY N/A 02/11/2013   Procedure: ESOPHAGOGASTRODUODENOSCOPY (EGD);  Surgeon: Irene Shipper, MD;  Location[facility] ENDOSCOPY;  Service: Endoscopy;  Laterality: N/A;  . INCISE AND DRAIN ABCESS  09/2011   I&D of peri-rectal abcess per Dr Zella Richer.     Social History:  reports that she has never smoked. She has never used smokeless tobacco. She reports that she does not drink alcohol or use drugs.  Allergies:  Allergies  Allergen Reactions  . Demerol [Meperidine] Other (See Comments)    Hallucinations  .  Penicillins Swelling and Other (See Comments)    Throat swelling Has patient had a PCN reaction causing immediate rash, facial/tongue/throat swelling, SOB or lightheadedness with hypotension: Yes Has patient had a PCN reaction causing severe rash involving mucus membranes or skin necrosis: No Has patient had a PCN reaction that required hospitalization No Has patient had a PCN reaction occurring within the last 10 years: No If all of the above answers are "NO", then may proceed with Cephalosporin use.   . Enbrel [Etanercept] Swelling    Arm  swelling   . Humira [Adalimumab] Swelling    Arm swelling   . Demerol [Meperidine] Itching and Rash  . Penicillins Itching and Rash     (Not in a hospital admission)  Blood pressure (!) 123/46, pulse 64, temperature 97.7 F (36.5 C), resp. rate 18, SpO2 100 %. Physical Exam: General: pleasant, elderly and frail white female who is laying in bed in NAD. HEENT: on 4L via [place], head is normocephalic, atraumatic.  Sclera are noninjected.  Mouth is pink and moist Heart: regular, rate, and rhythm, possible systolic ejection murmur,  Palpable pedal pulses bilaterally Lungs: Respiratory effort nonlabored; TTP of right posterior lateral ribcage with overlying contusion. +crepitus over right ribcage. CTABL.  Abd: soft, NT/ND, +BS; abdominal wall bruising from insulin injections. MS: no cervical spine tenderness, extremities are symmetrical with cyanosis; arthritic changes of hands. Skin: warm and dry  Psych:  appropriate affect. Neuro: A&Ox3. sensory and motor function grossly intact, normal speech  Results for orders placed or performed during the hospital encounter of 01/18/16 (from the past 48 hour(s))  CBC     Status: Abnormal   Collection Time: 01/18/16 11:11 AM  Result Value Ref Range   WBC 12.1 (H) 4.0 - 10.5 K/uL   RBC 2.89 (L) 3.87 - 5.11 MIL/uL   Hemoglobin 8.4 (L) 12.0 - 15.0 g/dL   HCT 24.7 (L) 36.0 - 46.0 %   MCV 85.5 78.0 - 100.0 fL   MCH 29.1 26.0 - 34.0 pg   MCHC 34.0 30.0 - 36.0 g/dL   RDW 13.8 11.5 - 15.5 %   Platelets 279 150 - 400 K/uL  Comprehensive metabolic panel     Status: Abnormal   Collection Time: 01/18/16 11:11 AM  Result Value Ref Range   Sodium 119 (LL) 135 - 145 mmol/L    Comment: CRITICAL RESULT CALLED TO, READ BACK BY AND VERIFIED WITH: KESLAR,R @ 1149 ON 100517 BY POTEAT,S    Potassium 5.5 (H) 3.5 - 5.1 mmol/L   Chloride 82 (L) 101 - 111 mmol/L   CO2 24 22 - 32 mmol/L   Glucose, Bld 312 (H) 65 - 99 mg/dL   BUN 25 (H) 6 - 20 mg/dL   Creatinine,  Ser 1.39 (H) 0.44 - 1.00 mg/dL   Calcium 8.0 (L) 8.9 - 10.3 mg/dL   Total Protein 5.7 (L) 6.5 - 8.1 g/dL   Albumin 3.2 (L) 3.5 - 5.0 g/dL   AST 19 15 - 41 U/L   ALT 14 14 - 54 U/L   Alkaline Phosphatase 75 38 - 126 U/L   Total Bilirubin 1.5 (H) 0.3 - 1.2 mg/dL   GFR calc non Af Amer 36 (L) >60 mL/min   GFR calc Af Amer 42 (L) >60 mL/min    Comment: (NOTE) The eGFR has been calculated using the CKD EPI equation. This calculation has not been validated in all clinical situations. eGFR's persistently <60 mL/min signify possible Chronic Kidney Disease.  Anion gap 13 5 - 15  Urinalysis, Routine w reflex microscopic (not at [facility])     Status: Abnormal   Collection Time: 01/18/16 11:31 AM  Result Value Ref Range   Color, Urine YELLOW YELLOW   APPearance CLOUDY (A) CLEAR   Specific Gravity, Urine 1.014 1.005 - 1.030   pH 5.5 5.0 - 8.0   Glucose, UA 100 (A) NEGATIVE mg/dL   Hgb urine dipstick NEGATIVE NEGATIVE   Bilirubin Urine NEGATIVE NEGATIVE   Ketones, ur 15 (A) NEGATIVE mg/dL   Protein, ur 100 (A) NEGATIVE mg/dL   Nitrite NEGATIVE NEGATIVE   Leukocytes, UA NEGATIVE NEGATIVE  Urine microscopic-add on     Status: Abnormal   Collection Time: 01/18/16 11:31 AM  Result Value Ref Range   Squamous Epithelial / LPF 0-5 (A) NONE SEEN   WBC, UA 0-5 0 - 5 WBC/hpf   RBC / HPF 0-5 0 - 5 RBC/hpf   Bacteria, UA FEW (A) NONE SEEN   Urine-Other YEAST PRESENT    Dg Ribs Unilateral W/chest Right  Result Date: 01/18/2016 CLINICAL DATA:  Fall. EXAM: RIGHT RIBS AND CHEST - 3+ VIEW COMPARISON:  04/07/2015. FINDINGS: Mediastinum hilar structures are normal. Lungs are clear of infiltrates. Prominent skin fold noted over the left chest. No pleural effusion or pneumothorax. Multiple right posterior, posterior-lateral common anterior lateral rib fractures are present. Stable right deformities both shoulders. IMPRESSION: Multiple posterior, posterior-lateral common anterior lateral right rib fractures are  noted. No pneumothorax. Electronically Signed   By: Marcello Moores  Register   On: 01/18/2016 09:15   Assessment/Plan Fall, without LOC Multiple rib fractures, Right - transfer to [facility] and admit per medicine for multiple medical problems - repeat CXR in AM  - incentive spirometry and pulmonary toilet - pain control - PT eval   Leukocytosis anemia Diabetes Mellitus Hyperglycemia Hypertension Hyperkalemia Hyponatremia Hypochloremia Acute on chronic renal insufficiency COPD   Dispo: pain control, follow repeat films Management of medical problems per medicine  Thank you for allowing [place] to participate in this patients care. We will follow.   Jill Alexanders, [facility] Surgery 01/18/2016, 12:35 PM Pager: [phone] Consults: [phone] Mon-Fri 7:00 am-4:30 pm Sat-Sun 7:00 am-11:30 am

## 2016-01-18 NOTE — ED Triage Notes (Signed)
Per EMS pt coming from home with c/o fall 2 days ago. EMS reports pt now has bruising with crepitus to r flank area, pain initially 10/10, pt was given 50 mcg fentanyl en route, reports relief.

## 2016-01-18 NOTE — Progress Notes (Signed)
CSW attempted to speak with patient. MD at bedside.  Genice Rouge [id number] ED CSW 01/18/2016 12:27 PM

## 2016-01-18 NOTE — H&P (Addendum)
TRH H&P    Patient Demographics:    Marisa [name], is a 75 y.o. female  MRN: [id number]  DOB - 04-Apr-1941  Admit Date - 01/18/2016  Referring MD/NP/PA: Dr Ashok Cordia  Outpatient Primary MD for the patient is Jani Gravel, MD  Patient coming from: home  Chief Complaint  Patient presents with  . Fall      HPI:    Marisa [name]  is a 75 y.o. female, With history of hypertension, COPD on 2 L home oxygen, diabetes mellitus, CAD, chronic anemia, Who came to the ED after patient had a mechanical fall at home hitting right side of chest wall. Patient complained of right lower ribs pain, etc. of showed multiple rib fractures on right. Patient denies passing out, no dizziness. She is not clear what made her fall. Patient was seen by trauma, and recommended transfer to [facility].  In the ED lab work revealed multiple lab abnormalities including hyponatremia, hyperkalemia, anemia. Patient denies shortness of breath. No fever or dysuria. No nausea vomiting or diarrhea.     Review of systems:    I  A full 10 point Review of Systems was done, except as stated above, all other Review of Systems were negative.   With Past History of the following :    Past Medical History:  Diagnosis Date  . Anemia   . Arthritis   . Asthma    Home 3L O2   . Asthma   . Chronic anemia   . Depression    patient feels depressed at the end of the month  . Diabetes mellitus type 2, insulin dependent ([place])    initial diagnoses 11/2008  . Diabetes mellitus without complication ([place])   . Diabetic ketoacidosis ([place]) 11/2010  . Diverticulosis   . Esophageal stricture 06/2004   Dilation 06/2004  . Family history of anesthesia complication    DAUGHTER HAS NAUSEA  . GERD (gastroesophageal reflux disease)   . history of  pericarditis 12/2002  . History of blood transfusion   . Hypertension   . Myocardial infarction    Chest  pain s/p normal cath in 08/2004 then NSTEMI during 11/2010 admission, negative Myoview  . Osteoarthritis   . Oxygen deficiency   . Pericardial effusion   . Rectal bleeding 11/2010   Large hemorrhoids  . Rheumatoid arteritis   . Rheumatoid arthritis(714.0)    On MTX and chronic steroids  . Seizure ([place]) 11/2008  . Supraventricular tachycardia [facility])       Past Surgical History:  Procedure Laterality Date  . ABDOMINAL HYSTERECTOMY  1980  . CHOLECYSTECTOMY    . COLONOSCOPY N/A 01/06/2013   Procedure: COLONOSCOPY;  Surgeon: Ladene Artist, MD;  Location: [facility] ENDOSCOPY;  Service: Endoscopy;  Laterality: N/A;  . ESOPHAGOGASTRODUODENOSCOPY N/A 02/11/2013   Procedure: ESOPHAGOGASTRODUODENOSCOPY (EGD);  Surgeon: Irene Shipper, MD;  Location: [facility] ENDOSCOPY;  Service: Endoscopy;  Laterality: N/A;  . INCISE AND DRAIN ABCESS  09/2011   I&D of peri-rectal abcess per Dr Zella Richer.       Social History:  Social History  Substance Use Topics  . Smoking status: Never Smoker  . Smokeless tobacco: Never Used  . Alcohol use No       Family History :     Family History  Problem Relation Age of Onset  . Heart disease Mother   . Stroke Father   . Dementia Neg Hx      Home Medications:   Prior to Admission medications   Medication Sig Start Date End Date Taking? Authorizing Provider  albuterol (PROVENTIL) (5 MG/ML) 0.5% nebulizer solution Take 2.5 mg by nebulization every 6 (six) hours as needed for wheezing or shortness of breath (shortness of breath).  10/08/11   Nishant Dhungel, MD  ALLERGY RELIEF 10 MG tablet Take 10 mg by mouth daily as needed.  08/01/15   Historical Provider, MD  AMBULATORY NON FORMULARY MEDICATION Continuous O2 @@ 2.5-3 LMP    Historical Provider, MD  BD PEN NEEDLE NANO U/F 32G X 4 MM MISC  10/13/14   Historical Provider, MD  BILBERRY, VACCINIUM MYRTILLUS, PO Take 250 mg by mouth daily.    Historical Provider, MD  Coenzyme Q10 (CO Q 10) 100 MG CAPS Take 100 mg by  mouth daily.    Historical Provider, MD  docusate sodium (COLACE) 100 MG capsule Take 200 mg by mouth daily as needed for mild constipation.     Historical Provider, MD  donepezil (ARICEPT) 10 MG tablet Take 1 tablet (10 mg total) by mouth at bedtime. 05/09/15   Melvenia Beam, MD  estrogens, conjugated, (PREMARIN) 0.625 MG tablet Take 0.625 mg by mouth daily.     Historical Provider, MD  FeFum-FePoly-FA-B Cmp-C-Biot (INTEGRA PLUS) CAPS Take 1 capsule by mouth 2 (two) times daily. Patient taking differently: Take 1 capsule by mouth daily.  03/06/15   Lennis Marion Downer, MD  fluticasone (FLONASE) 50 MCG/ACT nasal spray Place 2 sprays into both nostrils daily as needed for allergies or rhinitis.    Historical Provider, MD  FREESTYLE LITE test strip TEST 10 TIMES DAILY ICD E11.65 09/16/14   Historical Provider, MD  Glucosamine-Fish Oil-EPA-DHA (GLUCOSAMINE & FISH OIL PO) Take 1,000 mg by mouth daily.    Historical Provider, MD  insulin aspart (NOVOLOG) 100 UNIT/ML injection 0-9 Units, Subcutaneous, 3 times daily with meals CBG < 70: implement hypoglycemia protocol CBG 70 - 120: 0 units CBG 121 - 150: 1 unit CBG 151 - 200: 2 units CBG 201 - 250: 3 units CBG 251 - 300: 5 units CBG 301 - 350: 7 units CBG 351 - 400: 9 units CBG > 400: call MD 01/11/14   Jonetta Osgood, MD  insulin detemir (LEVEMIR) 100 UNIT/ML injection Inject 0.1 mLs (10 Units total) into the skin daily at 12 noon. Patient taking differently: Inject 18 Units into the skin daily at 12 noon.  01/12/14   Verlee Monte, MD  ipratropium (ATROVENT) 0.06 % nasal spray Place 1 spray into both nostrils daily as needed for rhinitis.  04/24/14   Historical Provider, MD  leflunomide (ARAVA) 20 MG tablet Take 20 mg by mouth daily.    Historical Provider, MD  levalbuterol [facility] HFA) 45 MCG/ACT inhaler Inhale 1-2 puffs into the lungs every 4 (four) hours as needed for wheezing (wheezing).     Historical Provider, MD  lisinopril (PRINIVIL,ZESTRIL)  40 MG tablet Take 40 mg by mouth daily.    Historical Provider, MD  LORazepam (ATIVAN) 1 MG tablet Take 1 mg by mouth 3 (three) times daily as needed for  anxiety.  03/22/15   Historical Provider, MD  memantine (NAMENDA) 5 MG tablet Take 1 tablet (5 mg total) by mouth 2 (two) times daily. 12/06/15   Melvenia Beam, MD  Menthol, Topical Analgesic, (ICY HOT BACK) 5 % PADS Apply 1 patch topically daily as needed (joint pain).    Historical Provider, MD  metoprolol succinate (TOPROL-XL) 100 MG 24 hr tablet Take 100 mg by mouth daily. Take with or immediately following a meal.    Historical Provider, MD  Multiple Vitamin (MULTIVITAMINS PO) Take 1 tablet by mouth. Daily.     Historical Provider, MD  naproxen sodium (ANAPROX) 220 MG tablet Take 220 mg by mouth daily as needed (pain).     Historical Provider, MD  predniSONE (DELTASONE) 10 MG tablet Take 10 mg by mouth daily with breakfast.    Historical Provider, MD  pregabalin (LYRICA) 75 MG capsule Take 1 capsule (75 mg total) by mouth 3 (three) times daily. 12/06/15   Melvenia Beam, MD  promethazine (PHENERGAN) 12.5 MG tablet Take 12.5 mg by mouth every 6 (six) hours as needed for nausea.    Historical Provider, MD  promethazine (PHENERGAN) 25 MG tablet Take 1 tablet by mouth 3 (three) times daily as needed. 07/31/15   Historical Provider, MD  ranitidine (ZANTAC) 150 MG tablet Take 150 mg by mouth 2 (two) times daily.    Historical Provider, MD  risperiDONE (RISPERDAL) 0.5 MG tablet Take 0.5 tablets (0.25 mg total) by mouth 2 (two) times daily. 01/11/14   Shanker Kristeen Mans, MD  traMADol (ULTRAM) 50 MG tablet Take 1 tablet (50 mg total) by mouth 3 (three) times daily as needed for moderate pain. 12/06/15   Melvenia Beam, MD  vitamin C (ASCORBIC ACID) 500 MG tablet Take 500 mg by mouth 2 (two) times daily.    Historical Provider, MD  Vitamin D-Vitamin K (VITAMIN K2-VITAMIN D3 PO) Take 1 tablet by mouth daily.    Historical Provider, MD     Allergies:       Allergies  Allergen Reactions  . Demerol [Meperidine] Other (See Comments)    Hallucinations  . Penicillins Swelling and Other (See Comments)    Throat swelling Has patient had a PCN reaction causing immediate rash, facial/tongue/throat swelling, SOB or lightheadedness with hypotension: Yes Has patient had a PCN reaction causing severe rash involving mucus membranes or skin necrosis: No Has patient had a PCN reaction that required hospitalization No Has patient had a PCN reaction occurring within the last 10 years: No If all of the above answers are "NO", then may proceed with Cephalosporin use.   . Enbrel [Etanercept] Swelling    Arm swelling   . Humira [Adalimumab] Swelling    Arm swelling   . Demerol [Meperidine] Itching and Rash  . Penicillins Itching and Rash     Physical Exam:   Vitals  Blood pressure (!) 149/42, pulse 70, temperature 97.7 F (36.5 C), resp. rate 18, SpO2 99 %.  1.  General: Fully elderly female in mild distress from pain  2. Psychiatric:  Intact judgement and  insight, awake alert, oriented x 3.  3. Neurologic: No focal neurological deficits, all cranial nerves intact.Strength 5/5 all 4 extremities, sensation intact all 4 extremities, plantars down going.  4. Eyes :  anicteric sclerae, moist conjunctivae with no lid lag. PERRLA.  5. ENMT:  Oropharynx clear with moist mucous membranes and good dentition  6. Neck:  supple, no cervical lymphadenopathy appriciated, No thyromegaly  7.  Respiratory : Normal respiratory effort, good air movement bilaterally,clear to  auscultation bilaterally. Positive right chest wall tenderness to palpation  8. Cardiovascular : RRR, no gallops, rubs or murmurs, no leg edema  9. Gastrointestinal:  Positive bowel sounds, abdomen soft, non-tender to palpation,no hepatosplenomegaly, no rigidity or guarding       10. Skin:  No cyanosis, normal texture and turgor, no rash, lesions or ulcers      Data Review:     CBC  Recent Labs Lab 01/18/16 1111  WBC 12.1*  HGB 8.4*  HCT 24.7*  PLT 279  MCV 85.5  MCH 29.1  MCHC 34.0  RDW 13.8   ------------------------------------------------------------------------------------------------------------------  Chemistries   Recent Labs Lab 01/18/16 1111  NA 119*  K 5.5*  CL 82*  CO2 24  GLUCOSE 312*  BUN 25*  CREATININE 1.39*  CALCIUM 8.0*  AST 19  ALT 14  ALKPHOS 75  BILITOT 1.5*   ------------------------------------------------------------------------------------------------------------------  ------------------------------------------------------------------------------------------------------------------ GFR: Estimated Creatinine Clearance: 22.6 mL/min (by C-G formula based on SCr of 1.39 mg/dL (H)). Liver Function Tests:  Recent Labs Lab 01/18/16 1111  AST 19  ALT 14  ALKPHOS 75  BILITOT 1.5*  PROT 5.7*  ALBUMIN 3.2*    --------------------------------------------------------------------------------------------------------------- Urine analysis:    Component Value Date/Time   COLORURINE YELLOW 01/18/2016 1131   APPEARANCEUR CLOUDY (A) 01/18/2016 1131   LABSPEC 1.014 01/18/2016 1131   PHURINE 5.5 01/18/2016 1131   GLUCOSEU 100 (A) 01/18/2016 1131   HGBUR NEGATIVE 01/18/2016 1131   BILIRUBINUR NEGATIVE 01/18/2016 1131   KETONESUR 15 (A) 01/18/2016 1131   PROTEINUR 100 (A) 01/18/2016 1131   UROBILINOGEN 0.2 05/28/2014 1021   NITRITE NEGATIVE 01/18/2016 1131   LEUKOCYTESUR NEGATIVE 01/18/2016 1131      Imaging Results:    Dg Ribs Unilateral W/chest Right  Result Date: 01/18/2016 CLINICAL DATA:  Fall. EXAM: RIGHT RIBS AND CHEST - 3+ VIEW COMPARISON:  04/07/2015. FINDINGS: Mediastinum hilar structures are normal. Lungs are clear of infiltrates. Prominent skin fold noted over the left chest. No pleural effusion or pneumothorax. Multiple right posterior, posterior-lateral common anterior lateral rib fractures are  present. Stable right deformities both shoulders. IMPRESSION: Multiple posterior, posterior-lateral common anterior lateral right rib fractures are noted. No pneumothorax. Electronically Signed   By: Marcello Moores  Register   On: 01/18/2016 09:15      Assessment & Plan:    Active Problems:   Multiple rib fractures   Rib fractures   1. Multiple rib fractures- patient already seen by trauma surgery, continue incentive spirometry, pain control with Vicodin when necessary. Patient will be  transferred to [facility] 2. Hyponatremia- ? Cause, check urine and serum osmolarity. Continue IV normal saline at 75 mL per hour. We will repeat BMP tonight 3. Hyperkalemia- give 1 dose of Kayexalate 30 g by mouth 1, hold lisinopril. Follow BMP in a.m. 4. Diabetes mellitus- we'll start sliding scale insulin with NovoLog. 5. CKD stage III-creatinine is stable, at baseline. Will follow functions in hospital. 6. COPD- stable, continue when necessary DuoNeb nebulizers, oxygen via [place] 7. Chronic anemia- patient has chronic anemia with baseline hemoglobin around 9. Today hemoglobin is 8.4, will follow H&H in a.m. 8. Rheumatoid arthritis- continue prednisone, [place].  Patient will be transferred to [facility], Dr. Marily Memos is the accepting physician   DVT Prophylaxis-   Lovenox   AM Labs Ordered, also please review Full Orders  Family Communication: Admission, patients condition and plan of care including tests being ordered have been  discussed with the patient and her husband at bedside* who indicate understanding and agree with the plan and Code Status.  Code Status:  DO NOT RESUSCITATE  Admission status: Inpatient    Time spent in minutes : 60 minutes   Teena Mangus S M.D on 01/18/2016 at 1:19 PM  Between 7am to 7pm - Pager - [phone]. After 7pm go to [email or web address] - password [facility]  [facility] - Office  [phone]

## 2016-01-18 NOTE — ED Provider Notes (Addendum)
[facility] DEPT Provider Note   CSN: [id number] Arrival date & time: 01/18/16  0806     History   Chief Complaint Chief Complaint  Patient presents with  . Fall    HPI Marisa [name] is a 75 y.o. female.  Patient c/o stumbled and fall 2 days ago at home in the bathroom. Pt describes a mechanical fall, no faintness or dizziness. No loc w fall.  Patient c/o right lower rib pain post fall, pain constant, dull, severe, worse w movement and palpation. Makes feel sob, hard to breath fully/deeply. No nv. Denies headache. No neck or back pain. No numbness/weakness. Denies other significant pain or injury.  Patient normally ambulates w cane.      The history is provided by the patient.  Fall  Associated symptoms include chest pain. Pertinent negatives include no abdominal pain, no headaches and no shortness of breath.    Past Medical History:  Diagnosis Date  . Anemia   . Arthritis   . Asthma    Home 3L O2   . Asthma   . Chronic anemia   . Depression    patient feels depressed at the end of the month  . Diabetes mellitus type 2, insulin dependent ([place])    initial diagnoses 11/2008  . Diabetes mellitus without complication ([place])   . Diabetic ketoacidosis ([place]) 11/2010  . Diverticulosis   . Esophageal stricture 06/2004   Dilation 06/2004  . Family history of anesthesia complication    DAUGHTER HAS NAUSEA  . GERD (gastroesophageal reflux disease)   . history of  pericarditis 12/2002  . History of blood transfusion   . Hypertension   . Myocardial infarction    Chest pain s/p normal cath in 08/2004 then NSTEMI during 11/2010 admission, negative Myoview  . Osteoarthritis   . Oxygen deficiency   . Pericardial effusion   . Rectal bleeding 11/2010   Large hemorrhoids  . Rheumatoid arteritis   . Rheumatoid arthritis(714.0)    On MTX and chronic steroids  . Seizure ([place]) 11/2008  . Supraventricular tachycardia [facility])     Patient Active Problem List   Diagnosis Date Noted  .  Memory loss 05/09/2015  . Immunosuppressed status ([place])   . Joint pain   . RA (rheumatoid arthritis) ([place])   . Joint effusion 05/28/2014  . Shoulder effusion 05/28/2014  . Rheumatoid arthritis ([place]) 05/28/2014  . UTI (urinary tract infection) 05/28/2014  . Diabetes mellitus type 2, insulin dependent ([place]) 05/28/2014  . COPD exacerbation ([place]) 05/28/2014  . Symptomatic anemia 04/13/2014  . Diabetes mellitus type 2, controlled ([place]) 04/13/2014  . Rheumatoid arthritis ([place]) 04/13/2014  . Essential hypertension 04/13/2014  . Hypoglycemia 01/04/2014  . Acute encephalopathy 01/04/2014  . Iron deficiency anemia due to chronic blood loss 03/07/2013  . Chronic respiratory failure ([place]) 02/11/2013  . Dyspnea 02/11/2013  . 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 Acute blood loss anemia 02/10/2013  . Gastroenteritis 02/10/2013  . Blood in stool 01/05/2013  . Personal history of colonic polyps 01/05/2013  . Anemia 01/04/2013  . Iron deficiency 02/26/2012  . SOB (shortness of breath) 02/26/2012  . Hyponatremia 02/26/2012  . AKI (acute kidney injury) ([place]) 02/26/2012  . MRSA cellulitis 10/08/2011  . Abscess of buttock, right 10/08/2011  . Sinus tachycardia 10/08/2011  . Protein calorie malnutrition ([place]) 10/08/2011  . SIRS (systemic inflammatory response syndrome) ([place]) 09/29/2011  . Tachycardia 09/29/2011  . Diabetes mellitus type 2, insulin dependent ([place])   . Chronic anemia   . NEOPLASM,  MALIGNANT, CARCINOMA, BASAL CELL, NOSE 06/08/2007  . POLYP, COLON 06/08/2007  . HYPOKALEMIA 06/08/2007  . Anemia, normocytic normochromic 06/08/2007  . Anxiety state 06/08/2007  . HYPERTENSION 06/08/2007  . UNSPECIFIED ACUTE PERICARDITIS 06/08/2007  . SINUS ARRHYTHMIA 06/08/2007  . HEMORRHOIDS, INTERNAL 06/08/2007  . ALLERGIC RHINITIS 06/08/2007  . BRONCHITIS 06/08/2007  . ASTHMA 06/08/2007  . COPD 06/08/2007  . ESOPHAGEAL STRICTURE 06/08/2007  . GERD 06/08/2007  . GASTRITIS 06/08/2007  .  CONSTIPATION, CHRONIC 06/08/2007  . IRRITABLE BOWEL SYNDROME 06/08/2007  . Rheumatoid arthritis(714.0) 06/08/2007  . SLEEP APNEA 06/08/2007  . HEADACHE, CHRONIC 06/08/2007  . NEOPLASM, MALIGNANT, CARCINOMA, BASAL CELL, NOSE 06/08/2007    Past Surgical History:  Procedure Laterality Date  . ABDOMINAL HYSTERECTOMY  1980  . CHOLECYSTECTOMY    . COLONOSCOPY N/A 01/06/2013   Procedure: COLONOSCOPY;  Surgeon: Ladene Artist, MD;  Location: [facility] ENDOSCOPY;  Service: Endoscopy;  Laterality: N/A;  . ESOPHAGOGASTRODUODENOSCOPY N/A 02/11/2013   Procedure: ESOPHAGOGASTRODUODENOSCOPY (EGD);  Surgeon: Irene Shipper, MD;  Location[facility] ENDOSCOPY;  Service: Endoscopy;  Laterality: N/A;  . INCISE AND DRAIN ABCESS  09/2011   I&D of peri-rectal abcess per Dr Zella Richer.     OB History    Gravida Para Term Preterm AB Living   0 0 0 0 0     SAB TAB Ectopic Multiple Live Births   0 0 0           Home Medications    Prior to Admission medications   Medication Sig Start Date End Date Taking? Authorizing Provider  albuterol (PROVENTIL) (5 MG/ML) 0.5% nebulizer solution Take 2.5 mg by nebulization every 6 (six) hours as needed for wheezing or shortness of breath (shortness of breath).  10/08/11   Nishant Dhungel, MD  ALLERGY RELIEF 10 MG tablet Take 10 mg by mouth daily as needed.  08/01/15   Historical Provider, MD  AMBULATORY NON FORMULARY MEDICATION Continuous O2 @@ 2.5-3 LMP    Historical Provider, MD  BD PEN NEEDLE NANO U/F 32G X 4 MM MISC  10/13/14   Historical Provider, MD  BILBERRY, VACCINIUM MYRTILLUS, PO Take 250 mg by mouth daily.    Historical Provider, MD  Coenzyme Q10 (CO Q 10) 100 MG CAPS Take 100 mg by mouth daily.    Historical Provider, MD  docusate sodium (COLACE) 100 MG capsule Take 200 mg by mouth daily as needed for mild constipation.     Historical Provider, MD  donepezil (ARICEPT) 10 MG tablet Take 1 tablet (10 mg total) by mouth at bedtime. 05/09/15   Melvenia Beam, MD  estrogens,  conjugated, (PREMARIN) 0.625 MG tablet Take 0.625 mg by mouth daily.     Historical Provider, MD  FeFum-FePoly-FA-B Cmp-C-Biot (INTEGRA PLUS) CAPS Take 1 capsule by mouth 2 (two) times daily. Patient taking differently: Take 1 capsule by mouth daily.  03/06/15   Lennis Marion Downer, MD  fluticasone (FLONASE) 50 MCG/ACT nasal spray Place 2 sprays into both nostrils daily as needed for allergies or rhinitis.    Historical Provider, MD  FREESTYLE LITE test strip TEST 10 TIMES DAILY ICD E11.65 09/16/14   Historical Provider, MD  Glucosamine-Fish Oil-EPA-DHA (GLUCOSAMINE & FISH OIL PO) Take 1,000 mg by mouth daily.    Historical Provider, MD  insulin aspart (NOVOLOG) 100 UNIT/ML injection 0-9 Units, Subcutaneous, 3 times daily with meals CBG < 70: implement hypoglycemia protocol CBG 70 - 120: 0 units CBG 121 - 150: 1 unit CBG 151 - 200: 2 units CBG  201 - 250: 3 units CBG 251 - 300: 5 units CBG 301 - 350: 7 units CBG 351 - 400: 9 units CBG > 400: call MD 01/11/14   Jonetta Osgood, MD  insulin detemir (LEVEMIR) 100 UNIT/ML injection Inject 0.1 mLs (10 Units total) into the skin daily at 12 noon. Patient taking differently: Inject 18 Units into the skin daily at 12 noon.  01/12/14   Verlee Monte, MD  ipratropium (ATROVENT) 0.06 % nasal spray Place 1 spray into both nostrils daily as needed for rhinitis.  04/24/14   Historical Provider, MD  leflunomide (ARAVA) 20 MG tablet Take 20 mg by mouth daily.    Historical Provider, MD  levalbuterol [facility] HFA) 45 MCG/ACT inhaler Inhale 1-2 puffs into the lungs every 4 (four) hours as needed for wheezing (wheezing).     Historical Provider, MD  lisinopril (PRINIVIL,ZESTRIL) 40 MG tablet Take 40 mg by mouth daily.    Historical Provider, MD  LORazepam (ATIVAN) 1 MG tablet Take 1 mg by mouth 3 (three) times daily as needed for anxiety.  03/22/15   Historical Provider, MD  memantine (NAMENDA) 5 MG tablet Take 1 tablet (5 mg total) by mouth 2 (two) times daily. 12/06/15    Melvenia Beam, MD  Menthol, Topical Analgesic, (ICY HOT BACK) 5 % PADS Apply 1 patch topically daily as needed (joint pain).    Historical Provider, MD  metoprolol succinate (TOPROL-XL) 100 MG 24 hr tablet Take 100 mg by mouth daily. Take with or immediately following a meal.    Historical Provider, MD  Multiple Vitamin (MULTIVITAMINS PO) Take 1 tablet by mouth. Daily.     Historical Provider, MD  naproxen sodium (ANAPROX) 220 MG tablet Take 220 mg by mouth daily as needed (pain).     Historical Provider, MD  predniSONE (DELTASONE) 10 MG tablet Take 10 mg by mouth daily with breakfast.    Historical Provider, MD  pregabalin (LYRICA) 75 MG capsule Take 1 capsule (75 mg total) by mouth 3 (three) times daily. 12/06/15   Melvenia Beam, MD  promethazine (PHENERGAN) 12.5 MG tablet Take 12.5 mg by mouth every 6 (six) hours as needed for nausea.    Historical Provider, MD  promethazine (PHENERGAN) 25 MG tablet Take 1 tablet by mouth 3 (three) times daily as needed. 07/31/15   Historical Provider, MD  ranitidine (ZANTAC) 150 MG tablet Take 150 mg by mouth 2 (two) times daily.    Historical Provider, MD  risperiDONE (RISPERDAL) 0.5 MG tablet Take 0.5 tablets (0.25 mg total) by mouth 2 (two) times daily. 01/11/14   Shanker Kristeen Mans, MD  traMADol (ULTRAM) 50 MG tablet Take 1 tablet (50 mg total) by mouth 3 (three) times daily as needed for moderate pain. 12/06/15   Melvenia Beam, MD  vitamin C (ASCORBIC ACID) 500 MG tablet Take 500 mg by mouth 2 (two) times daily.    Historical Provider, MD  Vitamin D-Vitamin K (VITAMIN K2-VITAMIN D3 PO) Take 1 tablet by mouth daily.    Historical Provider, MD    Family History Family History  Problem Relation Age of Onset  . Heart disease Mother   . Stroke Father   . Dementia Neg Hx     Social History Social History  Substance Use Topics  . Smoking status: Never Smoker  . Smokeless tobacco: Never Used  . Alcohol use No     Allergies   Demerol [meperidine];  Penicillins; Enbrel [etanercept]; Humira [adalimumab]; Demerol [meperidine]; and Penicillins  Review of Systems Review of Systems  Constitutional: Negative for fever.  HENT: Negative for nosebleeds.   Eyes: Negative for pain.  Respiratory: Negative for cough and shortness of breath.   Cardiovascular: Positive for chest pain. Negative for leg swelling.  Gastrointestinal: Negative for abdominal pain and vomiting.  Genitourinary: Negative for flank pain.  Musculoskeletal: Negative for back pain and neck pain.  Skin: Negative for wound.  Neurological: Negative for weakness, numbness and headaches.  Hematological: Does not bruise/bleed easily.  Psychiatric/Behavioral: Negative for confusion.     Physical Exam Updated Vital Signs BP 189/65   Pulse 76   Temp 97.7 F (36.5 C)   Resp 20   SpO2 (!) 89%   Physical Exam  Constitutional: She is oriented to person, place, and time. She appears well-developed and well-nourished. No distress.  HENT:  Head: Atraumatic.  Eyes: Conjunctivae are normal. Pupils are equal, round, and reactive to light. No scleral icterus.  Neck: Neck supple. No tracheal deviation present.  Cardiovascular: Normal rate, regular rhythm, normal heart sounds and intact distal pulses.   Pulmonary/Chest: Effort normal and breath sounds normal. No respiratory distress. She exhibits tenderness.  Normal chest wall movement.  Right lateral chest wall crepitus, w marked bruising and tenderness right lower chest.   Abdominal: Soft. Normal appearance and bowel sounds are normal. She exhibits no distension. There is no tenderness.  No abd wall contusion or bruising.   Genitourinary:  Genitourinary Comments: No cva tenderness  Musculoskeletal: She exhibits no edema.  CTLS spine, non tender, aligned, no step off. Good rom bil ext without pain or focal bony tenderness.   Neurological: She is alert and oriented to person, place, and time.  Speech clear. Motor intact bil. sens  grossly intact.   Skin: Skin is warm and dry. No rash noted. She is not diaphoretic.  Psychiatric: She has a normal mood and affect.  Nursing note and vitals reviewed.    ED Treatments / Results  Labs (all labs ordered are listed, but only abnormal results are displayed) Results for orders placed or performed during the hospital encounter of 01/18/16  CBC  Result Value Ref Range   WBC 12.1 (H) 4.0 - 10.5 K/uL   RBC 2.89 (L) 3.87 - 5.11 MIL/uL   Hemoglobin 8.4 (L) 12.0 - 15.0 g/dL   HCT 24.7 (L) 36.0 - 46.0 %   MCV 85.5 78.0 - 100.0 fL   MCH 29.1 26.0 - 34.0 pg   MCHC 34.0 30.0 - 36.0 g/dL   RDW 13.8 11.5 - 15.5 %   Platelets 279 150 - 400 K/uL  Comprehensive metabolic panel  Result Value Ref Range   Sodium 119 (LL) 135 - 145 mmol/L   Potassium 5.5 (H) 3.5 - 5.1 mmol/L   Chloride 82 (L) 101 - 111 mmol/L   CO2 24 22 - 32 mmol/L   Glucose, Bld 312 (H) 65 - 99 mg/dL   BUN 25 (H) 6 - 20 mg/dL   Creatinine, Ser 1.39 (H) 0.44 - 1.00 mg/dL   Calcium 8.0 (L) 8.9 - 10.3 mg/dL   Total Protein 5.7 (L) 6.5 - 8.1 g/dL   Albumin 3.2 (L) 3.5 - 5.0 g/dL   AST 19 15 - 41 U/L   ALT 14 14 - 54 U/L   Alkaline Phosphatase 75 38 - 126 U/L   Total Bilirubin 1.5 (H) 0.3 - 1.2 mg/dL   GFR calc non Af Amer 36 (L) >60 mL/min   GFR calc Af Amer 42 (L) >  60 mL/min   Anion gap 13 5 - 15  Urinalysis, Routine w reflex microscopic (not at [facility])  Result Value Ref Range   Color, Urine YELLOW YELLOW   APPearance CLOUDY (A) CLEAR   Specific Gravity, Urine 1.014 1.005 - 1.030   pH 5.5 5.0 - 8.0   Glucose, UA 100 (A) NEGATIVE mg/dL   Hgb urine dipstick NEGATIVE NEGATIVE   Bilirubin Urine NEGATIVE NEGATIVE   Ketones, ur 15 (A) NEGATIVE mg/dL   Protein, ur 100 (A) NEGATIVE mg/dL   Nitrite NEGATIVE NEGATIVE   Leukocytes, UA NEGATIVE NEGATIVE  Urine microscopic-add on  Result Value Ref Range   Squamous Epithelial / LPF 0-5 (A) NONE SEEN   WBC, UA 0-5 0 - 5 WBC/hpf   RBC / HPF 0-5 0 - 5 RBC/hpf    Bacteria, UA FEW (A) NONE SEEN   Urine-Other YEAST PRESENT     EKG  EKG Interpretation None       Radiology Dg Ribs Unilateral W/chest Right  Result Date: 01/18/2016 CLINICAL DATA:  Fall. EXAM: RIGHT RIBS AND CHEST - 3+ VIEW COMPARISON:  04/07/2015. FINDINGS: Mediastinum hilar structures are normal. Lungs are clear of infiltrates. Prominent skin fold noted over the left chest. No pleural effusion or pneumothorax. Multiple right posterior, posterior-lateral common anterior lateral rib fractures are present. Stable right deformities both shoulders. IMPRESSION: Multiple posterior, posterior-lateral common anterior lateral right rib fractures are noted. No pneumothorax. Electronically Signed   By: Marcello Moores  Register   On: 01/18/2016 09:15    Procedures Procedures (including critical care time)  Medications Ordered in ED Medications - No data to display   Initial Impression / Assessment and Plan / ED Course  I have reviewed the triage vital signs and the nursing notes.  Pertinent labs & imaging results that were available during my care of the patient were reviewed by me and considered in my medical decision making (see chart for details).  Clinical Course   Xrays.  Reviewed nursing notes and prior charts for additional history.   Pt does use home o2, many co-morbidities at baseline.  On her 2 liters home o2, sat 90%. On 3-4 liters mid to upper 90s.   Patient feels too difficult to get around at home, severe pain, mild sob - given above, age, multiple co-morbidities in elderly patient w multiple rib fxs, will admit.   Pain improved in ED w iv meds.   Will discuss w trauma/gen surg.   Na low, even when corrected for high glucose is 123, which is significantly lower than previous/baseline.  ns bolus.   Surgery did eval in ED - they request admit to medicine service at [facility], and they will follow patient from rib fx standpoint.   Hospitalists consulted for admit/transfer to  [facility]   Discussed with Dr Darrick Meigs, who requests temp orders to [facility] - he will facilitate transfer to his colleague there.  Final Clinical Impressions(s) / ED Diagnoses   Final diagnoses:  None    New Prescriptions New Prescriptions   No medications on file              Lajean Saver, MD 01/18/16 1251

## 2016-01-18 NOTE — ED Notes (Signed)
[facility] called for transportation 

## 2016-01-18 NOTE — ED Notes (Signed)
Bed: WA07 Expected date:  Expected time:  Means of arrival:  Comments: EMS- 75yo F, fall x 2 days ago

## 2016-01-18 NOTE — ED Notes (Signed)
Pt. CBG 327. RN,Katie made aware.

## 2016-01-18 NOTE — ED Notes (Signed)
Hospitalist made aware of patients blood pressure. No new orders placed.

## 2016-01-19 ENCOUNTER — Inpatient Hospital Stay (HOSPITAL_COMMUNITY): Payer: Medicare Other

## 2016-01-19 LAB — CBC
HCT: 23.2 % — ABNORMAL LOW (ref 36.0–46.0)
Hemoglobin: 7.7 g/dL — ABNORMAL LOW (ref 12.0–15.0)
MCH: 28.8 pg (ref 26.0–34.0)
MCHC: 33.2 g/dL (ref 30.0–36.0)
MCV: 86.9 fL (ref 78.0–100.0)
PLATELETS: 277 10*3/uL (ref 150–400)
RBC: 2.67 MIL/uL — AB (ref 3.87–5.11)
RDW: 13.9 % (ref 11.5–15.5)
WBC: 10.3 10*3/uL (ref 4.0–10.5)

## 2016-01-19 LAB — BASIC METABOLIC PANEL
Anion gap: 14 (ref 5–15)
Anion gap: 12 (ref 5–15)
BUN: 31 mg/dL — ABNORMAL HIGH (ref 6–20)
BUN: 27 mg/dL — AB (ref 6–20)
CALCIUM: 7.7 mg/dL — AB (ref 8.9–10.3)
CALCIUM: 7.8 mg/dL — AB (ref 8.9–10.3)
CO2: 20 mmol/L — AB (ref 22–32)
CO2: 21 mmol/L — AB (ref 22–32)
CREATININE: 2.21 mg/dL — AB (ref 0.44–1.00)
CREATININE: 2.02 mg/dL — AB (ref 0.44–1.00)
Chloride: 91 mmol/L — ABNORMAL LOW (ref 101–111)
Chloride: 93 mmol/L — ABNORMAL LOW (ref 101–111)
GFR calc non Af Amer: 21 mL/min — ABNORMAL LOW (ref >60)
GFR calc non Af Amer: 23 mL/min — ABNORMAL LOW (ref >60)
GFR, EST AFRICAN AMERICAN: 24 mL/min — AB (ref >60)
GFR, EST AFRICAN AMERICAN: 27 mL/min — AB (ref >60)
GLUCOSE: 311 mg/dL — AB (ref 65–99)
Glucose, Bld: 245 mg/dL — ABNORMAL HIGH (ref 65–99)
Potassium: 4.9 mmol/L (ref 3.5–5.1)
Potassium: 5 mmol/L (ref 3.5–5.1)
Sodium: 125 mmol/L — ABNORMAL LOW (ref 135–145)
Sodium: 126 mmol/L — ABNORMAL LOW (ref 135–145)

## 2016-01-19 LAB — GLUCOSE, CAPILLARY
GLUCOSE-CAPILLARY: 233 mg/dL — AB (ref 65–99)
Glucose-Capillary: 251 mg/dL — ABNORMAL HIGH (ref 65–99)
Glucose-Capillary: 301 mg/dL — ABNORMAL HIGH (ref 65–99)
Glucose-Capillary: 143 mg/dL — ABNORMAL HIGH (ref 65–99)

## 2016-01-19 LAB — HEMOGLOBIN A1C
Hgb A1c MFr Bld: 6.4 % — ABNORMAL HIGH (ref 4.8–5.6)
Mean Plasma Glucose: 137 mg/dL

## 2016-01-19 MED ORDER — MORPHINE SULFATE (PF) 2 MG/ML IV SOLN
2.0000 mg | INTRAVENOUS | Status: DC | PRN
  Administered 2016-01-19 – 2016-01-22 (×7): 2 mg via INTRAVENOUS
  Filled 2016-01-19 (×7): qty 1

## 2016-01-19 MED ORDER — TRAMADOL HCL 50 MG PO TABS
50.0000 mg | ORAL_TABLET | Freq: Four times a day (QID) | ORAL | Status: DC | PRN
  Administered 2016-01-19: 50 mg via ORAL
  Administered 2016-01-20: 100 mg via ORAL
  Administered 2016-01-20: 50 mg via ORAL
  Administered 2016-01-20: 100 mg via ORAL
  Administered 2016-01-21: 50 mg via ORAL
  Administered 2016-01-21 – 2016-01-22 (×3): 100 mg via ORAL
  Filled 2016-01-19: qty 1
  Filled 2016-01-19 (×3): qty 2
  Filled 2016-01-19: qty 1
  Filled 2016-01-19 (×2): qty 2
  Filled 2016-01-19: qty 1

## 2016-01-19 NOTE — Progress Notes (Signed)
Inpatient Diabetes Program Recommendations  AACE/ADA: New Consensus Statement on Inpatient Glycemic Control (2015)  Target Ranges:  Prepandial:   less than 140 mg/dL      Peak postprandial:   less than 180 mg/dL (1-2 hours)      Critically ill patients:  140 - 180 mg/dL   Lab Results  Component Value Date   GLUCAP 251 (H) 01/19/2016   HGBA1C 6.4 (H) 01/18/2016    Review of Glycemic Control  Results for Marisa [name], Marisa [name] (MRN [id number]) as of 01/19/2016 07:44  Ref. Range 01/18/2016 19:47 01/18/2016 21:15 01/19/2016 06:37  Glucose-Capillary Latest Ref Range: 65 - 99 mg/dL 327 (H) 363 (H) 251 (H)    Diabetes history: Type 2 Outpatient Diabetes medications: Novolog 0-9 units tid, Levemir 18 units qday Current orders for Inpatient glycemic control: Novolog 0-9 units tid, Levemir 18 units qday, * prednisone 10mg  qam  Inpatient Diabetes Program Recommendations:  Consider adding Novolog 2 units tid with meals since she is on steroids (hold if she eats less than 50%).  Gentry Fitz, RN, BA, MHA, CDE Diabetes Coordinator Inpatient Diabetes Program  [phone] (Team Pager) [phone] ([place]) 01/19/2016 7:47 AM

## 2016-01-19 NOTE — Progress Notes (Signed)
PROGRESS NOTE  Marisa [name] [id number] DOB: Dec 20, 1940 DOA: 01/18/2016 PCP: Jani Gravel, MD   LOS: 1 day   Brief Narrative:  Marisa [name]  is a 75 y.o. female, With history of hypertension, COPD on 2 L home oxygen, diabetes mellitus, CAD, chronic anemia, Who came to the ED after patient had a mechanical fall at home hitting right side of chest wall. Patient complained of right lower ribs pain, etc. of showed multiple rib fractures on right. Patient denies passing out, no dizziness. She is not clear what made her fall. Patient was seen by trauma, and recommended transfer to [facility]. In the ED lab work revealed multiple lab abnormalities including hyponatremia, hyperkalemia, anemia. Patient denies shortness of breath. No fever or dysuria. No nausea vomiting or diarrhea.  Assessment & Plan: Active Problems:   Anemia, normocytic normochromic   Hyponatremia   Diabetes mellitus type 2, controlled (HCC)   RA (rheumatoid arthritis) (HCC)   Multiple rib fractures   Rib fractures   Multiple rib fractures  - patient seen by trauma surgery, continue incentive spirometry, pain control.  - no acute interventions needed  Hyponatremia - likely dehydration / poor solute intake with low urine sodium?  - improving with fluids, continue to monitor. Recheck in pm. Continue fluids  Hyperkalemia - s/p 1 dose of Kayexalate 30 g by mouth 1 on 10/5, hold lisinopril.  - improved this morning to 4.9. Repeat this pm  Diabetes mellitus - we'll start sliding scale insulin with NovoLog.  CKD stage III - baseline creatinine 1.3, worsening today to 2.2, closely monitor, continue fluids - avoid NSAIDs, hold ACEI  COPD - stable, continue when necessary DuoNeb nebulizers, oxygen via [place]  Chronic anemia- patient has chronic anemia with baseline hemoglobin around 9. Today hemoglobin is 8.4, decreased to 7.7, likely dilutional. No bleeding  Rheumatoid arthritis - continue prednisone,  Arava.   DVT prophylaxis: Lovenox Code Status: DNR Family Communication: d/w husband at bedside Disposition Plan: likely SNF 2 days  Consultants:   Trauma surgery   Procedures:   None   Antimicrobials:  None    Subjective: - in pain, mainly left ribcage, worse with deep breathing.   Objective: Vitals:   01/18/16 2200 01/18/16 2257 01/19/16 0500 01/19/16 0600  BP:   (!) 138/38 (!) 149/39  Pulse: 98 88 93   Resp:   18   Temp:   99.3 F (37.4 C)   TempSrc:   Oral   SpO2:   100%   Weight:      Height:        Intake/Output Summary (Last 24 hours) at 01/19/16 1139 Last data filed at 01/19/16 0357  Gross per 24 hour  Intake                0 ml  Output              600 ml  Net             -600 ml   Filed Weights   01/18/16 1535  Weight: 49.9 kg (110 lb)    Examination: Constitutional: NAD Vitals:   01/18/16 2200 01/18/16 2257 01/19/16 0500 01/19/16 0600  BP:   (!) 138/38 (!) 149/39  Pulse: 98 88 93   Resp:   18   Temp:   99.3 F (37.4 C)   TempSrc:   Oral   SpO2:   100%   Weight:      Height:  Eyes: PERRL, lids and conjunctivae normal ENMT: Mucous membranes are moist. No oropharyngeal exudates Respiratory: clear to auscultation bilaterally, no wheezing, no crackles. Normal respiratory effort.  Cardiovascular: Regular rate and rhythm, no murmurs / rubs / gallops. No LE edema.  Abdomen: no tenderness. Bowel sounds positive.  Musculoskeletal: ulnar deviation of fingers Skin: no rashes, lesions, ulcers. No induration Neurologic: non focal    Data Reviewed: I have personally reviewed following labs and imaging studies  CBC:  Recent Labs Lab 01/18/16 1111 01/19/16 0810  WBC 12.1* 10.3  HGB 8.4* 7.7*  HCT 24.7* 23.2*  MCV 85.5 86.9  PLT 279 [id number]   Basic Metabolic Panel:  Recent Labs Lab 01/18/16 1111 01/19/16 0810  NA 119* 125*  K 5.5* 4.9  CL 82* 91*  CO2 24 20*  GLUCOSE 312* 245*  BUN 25* 31*  CREATININE 1.39* 2.21*  CALCIUM  8.0* 7.7*   GFR: Estimated Creatinine Clearance: 14.2 mL/min (by C-G formula based on SCr of 2.21 mg/dL (H)). Liver Function Tests:  Recent Labs Lab 01/18/16 1111  AST 19  ALT 14  ALKPHOS 75  BILITOT 1.5*  PROT 5.7*  ALBUMIN 3.2*   No results for input(s): LIPASE, AMYLASE in the last 168 hours. No results for input(s): AMMONIA in the last 168 hours. Coagulation Profile: No results for input(s): INR, PROTIME in the last 168 hours. Cardiac Enzymes: No results for input(s): CKTOTAL, CKMB, CKMBINDEX, TROPONINI in the last 168 hours. BNP (last 3 results) No results for input(s): PROBNP in the last 8760 hours. HbA1C:  Recent Labs  01/18/16 1638  HGBA1C 6.4*   CBG:  Recent Labs Lab 01/18/16 1947 01/18/16 2115 01/19/16 0637 01/19/16 1108  GLUCAP 327* 363* 251* 233*   Lipid Profile: No results for input(s): CHOL, HDL, LDLCALC, TRIG, CHOLHDL, LDLDIRECT in the last 72 hours. Thyroid Function Tests:  Recent Labs  01/18/16 1629  TSH 1.657   Anemia Panel: No results for input(s): VITAMINB12, FOLATE, FERRITIN, TIBC, IRON, RETICCTPCT in the last 72 hours. Urine analysis:    Component Value Date/Time   COLORURINE YELLOW 01/18/2016 1131   APPEARANCEUR CLOUDY (A) 01/18/2016 1131   LABSPEC 1.014 01/18/2016 1131   PHURINE 5.5 01/18/2016 1131   GLUCOSEU 100 (A) 01/18/2016 1131   HGBUR NEGATIVE 01/18/2016 1131   BILIRUBINUR NEGATIVE 01/18/2016 1131   KETONESUR 15 (A) 01/18/2016 1131   PROTEINUR 100 (A) 01/18/2016 1131   UROBILINOGEN 0.2 05/28/2014 1021   NITRITE NEGATIVE 01/18/2016 1131   LEUKOCYTESUR NEGATIVE 01/18/2016 1131   Sepsis Labs: Invalid input(s): PROCALCITONIN, LACTICIDVEN  No results found for this or any previous visit (from the past 240 hour(s)).    Radiology Studies: Dg Ribs Unilateral W/chest Right  Result Date: 01/18/2016 CLINICAL DATA:  Fall. EXAM: RIGHT RIBS AND CHEST - 3+ VIEW COMPARISON:  04/07/2015. FINDINGS: Mediastinum hilar structures  are normal. Lungs are clear of infiltrates. Prominent skin fold noted over the left chest. No pleural effusion or pneumothorax. Multiple right posterior, posterior-lateral common anterior lateral rib fractures are present. Stable right deformities both shoulders. IMPRESSION: Multiple posterior, posterior-lateral common anterior lateral right rib fractures are noted. No pneumothorax. Electronically Signed   By: Marcello Moores  Register   On: 01/18/2016 09:15   Dg Chest Port 1 View  Result Date: 01/19/2016 CLINICAL DATA:  Status post fall 3 days ago with multiple right rib fractures. Shortness of breath. History of coronary artery disease and previous MI, asthma-COPD, rheumatoid arthritis. EXAM: PORTABLE CHEST 1 VIEW COMPARISON:  Chest x-ray of January 18, 2016 FINDINGS: The lungs are well-expanded. There is no pneumothorax. Fractures of the posterior lateral aspects of the right seventh, eighth, and ninth ribs are observed. There is no pleural effusion. The heart is top-normal in size. The pulmonary vascularity is normal. There is calcification in the wall of the aortic arch. 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 There is multilevel degenerative disc disease of the thoracic spine with gentle dextrocurvature centered in the mid thoracic spine. IMPRESSION: Hyperinflation consistent with COPD. No pneumothorax, pleural effusion, or pneumonia. No CHF.  Aortic atherosclerosis. Fractures of the seventh through ninth right lateral ribs. 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 Multilevel degenerative disc disease of the thoracic spine. Electronically Signed   By: David  Martinique M.D.   On: 01/19/2016 07:40     Scheduled Meds: . donepezil  10 mg Oral QHS  . famotidine  10 mg Oral BID  . insulin aspart  0-9 Units Subcutaneous TID WC  . insulin detemir  18 Units Subcutaneous Q1200  . leflunomide  20 mg Oral Daily  . metoprolol succinate  100 mg Oral Daily  . predniSONE  10 mg Oral Q breakfast   . pregabalin  75 mg Oral TID  . risperiDONE  0.25 mg Oral BID  . sodium polystyrene  30 g Oral Once   Continuous Infusions:    Marzetta Board, MD, PhD [facility] Pager [phone]  If 7PM-7AM, please contact night-coverage [email or web address] Password [email or web address] 01/19/2016, 11:39 AM

## 2016-01-19 NOTE — Evaluation (Addendum)
Physical Therapy Evaluation Patient Details Name: Marisa [name] MRN: [id number] DOB: 12-24-1940 Today's Date: 01/19/2016   History of Present Illness  75 y.o. female s/p fall at home with resulting Rt rib fractures. PMH: chronic shoulder dislocation and degenerative changes, supraventricular tachycardia, seizure, RA, MI, HTN, diabetes, depression, asthma.  Clinical Impression  Pt mobilizing slowly during initial PT evaluation, requiring max assistance with bed mobility (+2 assistance recommended). Pt able to stand at EOB but declines attempting to ambulate or transfer to chair. Based upon the patient's current mobility level, recommending SNF following D/C from acute care. PT to continue to follow and attempt to progress the patient's mobility and independence as tolerated.     Follow Up Recommendations SNF;Supervision for mobility/OOB    Equipment Recommendations  Rolling walker with 5" wheels;3in1 (PT)    Recommendations for Other Services       Precautions / Restrictions Precautions Precautions: Fall Precaution Comments: rib fractures, O2 dependent at home (3L) Restrictions Weight Bearing Restrictions: No      Mobility  Bed Mobility Overal bed mobility: Needs Assistance Bed Mobility: Supine to Sit;Sit to Supine     Supine to sit: Max assist Sit to supine: Max assist   General bed mobility comments: using bed pad to assist  Transfers Overall transfer level: Needs assistance Equipment used: Rolling walker (2 wheeled) Transfers: Sit to/from Stand Sit to Stand: Mod assist         General transfer comment: pt able to stand EOB approximately 30 seconds. Declining to sit up in chair.   Ambulation/Gait                Stairs            Wheelchair Mobility    Modified Rankin (Stroke Patients Only)       Balance Overall balance assessment: Needs assistance Sitting-balance support: Single extremity supported Sitting balance-Leahy Scale:  Fair Sitting balance - Comments: initially needing max assist but improving to supervision   Standing balance support: Bilateral upper extremity supported Standing balance-Leahy Scale: Poor Standing balance comment: using rw                             Pertinent Vitals/Pain Pain Assessment: Faces Faces Pain Scale: Hurts even more Pain Location: Rt chest Pain Descriptors / Indicators:  (just pain, can't put a number on it) Pain Intervention(s): Monitored during session    Home Living Family/patient expects to be discharged to:: Unsure Living Arrangements: Spouse/significant other Available Help at Discharge: Family;Available 24 hours/day Type of Home: House Home Access: Stairs to enter Entrance Stairs-Rails: Left Entrance Stairs-Number of Steps: 5 Home Layout: One level Home Equipment: Cane - single point      Prior Function Level of Independence: Needs assistance   Gait / Transfers Assistance Needed: ambulating mod I with cane  ADL's / Homemaking Assistance Needed: daughter assisting with bathing        Hand Dominance        Extremity/Trunk Assessment   Upper Extremity Assessment: Generalized weakness           Lower Extremity Assessment: Generalized weakness      Cervical / Trunk Assessment: Kyphotic  Communication      Cognition Arousal/Alertness: Awake/alert Behavior During Therapy: WFL for tasks assessed/performed Overall Cognitive Status: Within Functional Limits for tasks assessed                      General Comments  Exercises     Assessment/Plan    PT Assessment Patient needs continued PT services  PT Problem List Decreased strength;Decreased range of motion;Decreased activity tolerance;Decreased balance;Decreased mobility          PT Treatment Interventions DME instruction;Gait training;Stair training;Functional mobility training;Therapeutic activities;Therapeutic exercise;Patient/family education    PT  Goals (Current goals can be found in the Care Plan section)  Acute Rehab PT Goals Patient Stated Goal: be able to get back home PT Goal Formulation: With patient Time For Goal Achievement: 02/02/16 Potential to Achieve Goals: Fair    Frequency Min 2X/week   Barriers to discharge        Co-evaluation               End of Session Equipment Utilized During Treatment: Oxygen Activity Tolerance: Patient limited by fatigue Patient left: in bed;with call bell/phone within reach;with family/visitor present Nurse Communication: Mobility status         Time: [id number] PT Time Calculation (min) (ACUTE ONLY): 23 min   Charges:   PT Evaluation $PT Eval Moderate Complexity: 1 Procedure PT Treatments $Therapeutic Activity: 8-22 mins   PT G Codes:        Cassell Clement, PT, CSCS Pager [phone] Office [phone]  01/19/2016, 4:43 PM

## 2016-01-19 NOTE — Progress Notes (Signed)
Patient states that she wanted to urinated- she has tried a few times and has been unsuccessful. Notified NP Baltazar Najjar of bladder residual of >500

## 2016-01-19 NOTE — Progress Notes (Signed)
Patient was bladder scanned- residual 999cc. Np Lynch notified. Awaiting any new orders.

## 2016-01-19 NOTE — Progress Notes (Signed)
Patient ID: Marisa [name], female   DOB: 03-Mar-1941, 75 y.o.   MRN: [id number]   LOS: 1 day   Subjective: C/o right chest pain, some SOB.   Objective: Vital signs in last 24 hours: Temp:  [98.7 F (37.1 C)-99.3 F (37.4 C)] 99.3 F (37.4 C) (10/06 0500) Pulse Rate:  [56-110] 93 (10/06 0500) Resp:  [11-19] 18 (10/06 0500) BP: (103-154)/(38-71) 149/39 (10/06 0600) SpO2:  [97 %-100 %] 100 % (10/06 0500) Weight:  [49.9 kg (110 lb)] 49.9 kg (110 lb) (10/05 1535) Last BM Date:  (PTA)   IS: 531ml   Laboratory  CBC  Recent Labs  01/18/16 1111 01/19/16 0810  WBC 12.1* 10.3  HGB 8.4* 7.7*  HCT 24.7* 23.2*  PLT 279 277    Radiology Results PORTABLE CHEST 1 VIEW  COMPARISON:  Chest x-ray of January 18, 2016  FINDINGS: The lungs are well-expanded. There is no pneumothorax. Fractures of the posterior lateral aspects of the right seventh, eighth, and ninth ribs are observed. There is no pleural effusion. The heart is top-normal in size. The pulmonary vascularity is normal. There is calcification in the wall of the aortic arch. 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 There is multilevel degenerative disc disease of the thoracic spine with gentle dextrocurvature centered in the mid thoracic spine.  IMPRESSION: Hyperinflation consistent with COPD. No pneumothorax, pleural effusion, or pneumonia.  No CHF.  Aortic atherosclerosis.  Fractures of the seventh through ninth right lateral ribs. 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 Multilevel degenerative disc disease of the thoracic spine.   Electronically Signed   By: David  Martinique M.D.   On: 01/19/2016 07:40   Physical Exam General appearance: alert and no distress Resp: clear to auscultation bilaterally Cardio: regular rate and rhythm GI: normal findings: bowel sounds normal and soft, non-tender   Assessment/Plan: Fall Multiple right rib fxs -- Pulmonary toilet. Would  recommend non-narcotic pain medication at this age, will change to tramadol. If that fails to control pain adequately would consider scheduling tramadol to decrease narcotic burden. Would also add NSAID if not contraindicated by medical issues. Will leave that to primary team.    Lisette Abu, PA-C Pager: [phone] General Trauma PA Pager: [phone]  01/19/2016

## 2016-01-19 NOTE — Progress Notes (Signed)
B/p has been elevated for his patient. NP Lynch notified. Awaiting any further orders

## 2016-01-20 LAB — BASIC METABOLIC PANEL
Anion gap: 8 (ref 5–15)
Anion gap: 4 — ABNORMAL LOW (ref 5–15)
BUN: 23 mg/dL — AB (ref 6–20)
BUN: 23 mg/dL — AB (ref 6–20)
CALCIUM: 8.1 mg/dL — AB (ref 8.9–10.3)
CHLORIDE: 95 mmol/L — AB (ref 101–111)
CHLORIDE: 96 mmol/L — AB (ref 101–111)
CO2: 27 mmol/L (ref 22–32)
CO2: 29 mmol/L (ref 22–32)
CREATININE: 1.43 mg/dL — AB (ref 0.44–1.00)
CREATININE: 1.52 mg/dL — AB (ref 0.44–1.00)
Calcium: 8.4 mg/dL — ABNORMAL LOW (ref 8.9–10.3)
GFR calc Af Amer: 38 mL/min — ABNORMAL LOW (ref >60)
GFR calc non Af Amer: 35 mL/min — ABNORMAL LOW (ref >60)
GFR, EST AFRICAN AMERICAN: 41 mL/min — AB (ref >60)
GFR, EST NON AFRICAN AMERICAN: 33 mL/min — AB (ref >60)
Glucose, Bld: 315 mg/dL — ABNORMAL HIGH (ref 65–99)
Glucose, Bld: 263 mg/dL — ABNORMAL HIGH (ref 65–99)
Potassium: 4.9 mmol/L (ref 3.5–5.1)
Potassium: 5.8 mmol/L — ABNORMAL HIGH (ref 3.5–5.1)
SODIUM: 129 mmol/L — AB (ref 135–145)
Sodium: 130 mmol/L — ABNORMAL LOW (ref 135–145)

## 2016-01-20 LAB — GLUCOSE, CAPILLARY
GLUCOSE-CAPILLARY: 120 mg/dL — AB (ref 65–99)
Glucose-Capillary: 32 mg/dL — CL (ref 65–99)
Glucose-Capillary: 163 mg/dL — ABNORMAL HIGH (ref 65–99)
Glucose-Capillary: 69 mg/dL (ref 65–99)
Glucose-Capillary: 370 mg/dL — ABNORMAL HIGH (ref 65–99)
Glucose-Capillary: 311 mg/dL — ABNORMAL HIGH (ref 65–99)
Glucose-Capillary: 299 mg/dL — ABNORMAL HIGH (ref 65–99)

## 2016-01-20 LAB — CBC
HEMATOCRIT: 25.8 % — AB (ref 36.0–46.0)
HEMOGLOBIN: 8.4 g/dL — AB (ref 12.0–15.0)
MCH: 28.3 pg (ref 26.0–34.0)
MCHC: 32.6 g/dL (ref 30.0–36.0)
MCV: 86.9 fL (ref 78.0–100.0)
Platelets: 259 10*3/uL (ref 150–400)
RBC: 2.97 MIL/uL — ABNORMAL LOW (ref 3.87–5.11)
RDW: 13.9 % (ref 11.5–15.5)
WBC: 9.4 10*3/uL (ref 4.0–10.5)

## 2016-01-20 MED ORDER — HYDRALAZINE HCL 20 MG/ML IJ SOLN
5.0000 mg | Freq: Once | INTRAMUSCULAR | Status: AC
  Administered 2016-01-20: 5 mg via INTRAVENOUS
  Filled 2016-01-20: qty 1

## 2016-01-20 MED ORDER — INSULIN DETEMIR 100 UNIT/ML ~~LOC~~ SOLN
16.0000 [IU] | Freq: Every day | SUBCUTANEOUS | Status: DC
  Administered 2016-01-20 – 2016-01-21 (×2): 16 [IU] via SUBCUTANEOUS
  Filled 2016-01-20 (×3): qty 0.16

## 2016-01-20 MED ORDER — AMLODIPINE BESYLATE 5 MG PO TABS
5.0000 mg | ORAL_TABLET | Freq: Every day | ORAL | Status: DC
  Administered 2016-01-20 – 2016-01-22 (×3): 5 mg via ORAL
  Filled 2016-01-20 (×3): qty 1

## 2016-01-20 MED ORDER — DEXTROSE-NACL 5-0.45 % IV SOLN
INTRAVENOUS | Status: DC
  Administered 2016-01-20: 02:00:00 via INTRAVENOUS

## 2016-01-20 MED ORDER — POLYETHYLENE GLYCOL 3350 17 G PO PACK
17.0000 g | PACK | Freq: Two times a day (BID) | ORAL | Status: AC
  Administered 2016-01-20 – 2016-01-21 (×4): 17 g via ORAL
  Filled 2016-01-20 (×3): qty 1

## 2016-01-20 MED ORDER — SODIUM CHLORIDE 0.9 % IV SOLN: INTRAVENOUS | Status: AC

## 2016-01-20 NOTE — Progress Notes (Signed)
Hypoglycemic Event  CBG: 32  Treatment: 15 GM carbohydrate snack  Symptoms: Sweaty  Follow-up CBG: Time:1:35 CBG Result:69  Possible Reasons for Event: Inadequate meal intake  Comments/MD notified:NP Linward Natal, Eloise Harman

## 2016-01-20 NOTE — NC FL2 (Signed)
[place] LEVEL OF CARE SCREENING TOOL     IDENTIFICATION  Patient Name: Marisa [name] Birthdate: 11-22-1940 Sex: female Admission Date (Current Location): 01/18/2016  [facility] and [place] Number:  Herbalist and Address:  [facility], [place] [address] [place], [place] [address]      Provider Number: [id number]  Attending Physician Name and Address:  Caren Griffins, MD  Relative Name and Phone Number:       Current Level of Care: Hospital Recommended Level of Care: [place] Prior Approval Number:    Date Approved/Denied:   PASRR Number:    Discharge Plan: SNF    Current Diagnoses: Patient Active Problem List   Diagnosis Date Noted  . Multiple rib fractures 01/18/2016  . Rib fractures 01/18/2016  . Chronic renal insufficiency, stage 3 (moderate)   . Fall from slip, trip, or stumble, initial encounter   . Memory loss 05/09/2015  . Immunosuppressed status ([place])   . Joint pain   . RA (rheumatoid arthritis) ([place])   . Joint effusion 05/28/2014  . Shoulder effusion 05/28/2014  . Rheumatoid arthritis ([place]) 05/28/2014  . UTI (urinary tract infection) 05/28/2014  . Diabetes mellitus type 2, insulin dependent ([place]) 05/28/2014  . COPD exacerbation ([place]) 05/28/2014  . Symptomatic anemia 04/13/2014  . Diabetes mellitus type 2, controlled ([place]) 04/13/2014  . Rheumatoid arthritis ([place]) 04/13/2014  . Essential hypertension 04/13/2014  . Hypoglycemia 01/04/2014  . Acute encephalopathy 01/04/2014  . Iron deficiency anemia due to chronic blood loss 03/07/2013  . Chronic respiratory failure ([place]) 02/11/2013  . Dyspnea 02/11/2013  . 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 Acute blood loss anemia 02/10/2013  . Gastroenteritis 02/10/2013  . Blood in stool 01/05/2013  . Personal history of colonic polyps 01/05/2013  . Anemia 01/04/2013  . Iron deficiency 02/26/2012  . SOB (shortness of breath) 02/26/2012  . Hyponatremia  02/26/2012  . AKI (acute kidney injury) ([place]) 02/26/2012  . MRSA cellulitis 10/08/2011  . Abscess of buttock, right 10/08/2011  . Sinus tachycardia 10/08/2011  . Protein calorie malnutrition ([place]) 10/08/2011  . SIRS (systemic inflammatory response syndrome) ([place]) 09/29/2011  . Tachycardia 09/29/2011  . Diabetes mellitus type 2, insulin dependent ([place])   . Chronic anemia   . NEOPLASM, MALIGNANT, CARCINOMA, BASAL CELL, NOSE 06/08/2007  . POLYP, COLON 06/08/2007  . HYPOKALEMIA 06/08/2007  . Anemia, normocytic normochromic 06/08/2007  . Anxiety state 06/08/2007  . HYPERTENSION 06/08/2007  . UNSPECIFIED ACUTE PERICARDITIS 06/08/2007  . SINUS ARRHYTHMIA 06/08/2007  . HEMORRHOIDS, INTERNAL 06/08/2007  . ALLERGIC RHINITIS 06/08/2007  . BRONCHITIS 06/08/2007  . ASTHMA 06/08/2007  . COPD 06/08/2007  . ESOPHAGEAL STRICTURE 06/08/2007  . GERD 06/08/2007  . GASTRITIS 06/08/2007  . CONSTIPATION, CHRONIC 06/08/2007  . IRRITABLE BOWEL SYNDROME 06/08/2007  . Rheumatoid arthritis(714.0) 06/08/2007  . SLEEP APNEA 06/08/2007  . HEADACHE, CHRONIC 06/08/2007  . NEOPLASM, MALIGNANT, CARCINOMA, BASAL CELL, NOSE 06/08/2007    Orientation RESPIRATION BLADDER Height & Weight     Self, Situation  O2 (2l/min) Continent Weight: 110 lb (49.9 kg) Height:  4\' 7"  (139.7 cm)  BEHAVIORAL SYMPTOMS/MOOD NEUROLOGICAL BOWEL NUTRITION STATUS   (none) Convulsions/Seizures Continent  (heart)  AMBULATORY STATUS COMMUNICATION OF NEEDS Skin   Extensive Assist Verbally Normal                       Personal Care Assistance Level of Assistance  Bathing, Feeding, Dressing Bathing Assistance: Limited assistance Feeding assistance: Independent  Dressing Assistance: Limited assistance     Functional Limitations Info  Sight, Hearing, Speech Sight Info: Adequate Hearing Info: Adequate Speech Info: Adequate    SPECIAL CARE FACTORS FREQUENCY  PT (By licensed PT)     PT Frequency:  (5x/week)               Contractures Contractures Info: Not present    Additional Factors Info  Code Status, Allergies Code Status Info:  (DNR) Allergies Info:  (Demerol Meperidine, Penicillins, Enbrel Etanercept, Humira Adalimumab)           Current Medications (01/20/2016):  This is the current hospital active medication list Current Facility-Administered Medications  Medication Dose Route Frequency Provider Last Rate Last Dose  . albuterol (PROVENTIL) (2.5 MG/3ML) 0.083% nebulizer solution 2.5 mg  2.5 mg Nebulization Q6H PRN Oswald Hillock, MD      . amLODipine (NORVASC) tablet 5 mg  5 mg Oral Daily Costin Karlyne Greenspan, MD   5 mg at 01/20/16 1000  . docusate sodium (COLACE) capsule 200 mg  200 mg Oral Daily PRN Oswald Hillock, MD      . donepezil (ARICEPT) tablet 10 mg  10 mg Oral QHS Oswald Hillock, MD   10 mg at 01/19/16 2200  . famotidine (PEPCID) tablet 10 mg  10 mg Oral BID Oswald Hillock, MD   10 mg at 01/20/16 1000  . insulin aspart (novoLOG) injection 0-9 Units  0-9 Units Subcutaneous TID WC Oswald Hillock, MD   9 Units at 01/20/16 0700  . insulin detemir (LEVEMIR) injection 16 Units  16 Units Subcutaneous Q1200 Caren Griffins, MD   16 Units at 01/20/16 1200  . ipratropium-albuterol (DUONEB) 0.5-2.5 (3) MG/3ML nebulizer solution 3 mL  3 mL Nebulization Q6H PRN Oswald Hillock, MD      . leflunomide (ARAVA) tablet 20 mg  20 mg Oral Daily Oswald Hillock, MD   20 mg at 01/20/16 1000  . LORazepam (ATIVAN) tablet 1 mg  1 mg Oral TID PRN Oswald Hillock, MD   1 mg at 01/19/16 2225  . metoprolol succinate (TOPROL-XL) 24 hr tablet 100 mg  100 mg Oral Daily Oswald Hillock, MD   100 mg at 01/20/16 1000  . morphine 2 MG/ML injection 2 mg  2 mg Intravenous Q4H PRN Lisette Abu, PA-C   2 mg at 01/19/16 1544  . ondansetron (ZOFRAN) tablet 4 mg  4 mg Oral Q6H PRN Oswald Hillock, MD       Or  . ondansetron (ZOFRAN) injection 4 mg  4 mg Intravenous Q6H PRN Oswald Hillock, MD      . polyethylene glycol (MIRALAX / GLYCOLAX) packet 17  g  17 g Oral BID Fanny Skates, MD      . predniSONE (DELTASONE) tablet 10 mg  10 mg Oral Q breakfast Oswald Hillock, MD   10 mg at 01/20/16 0800  . pregabalin (LYRICA) capsule 75 mg  75 mg Oral TID Oswald Hillock, MD   75 mg at 01/20/16 1000  . risperiDONE (RISPERDAL) tablet 0.25 mg  0.25 mg Oral BID Oswald Hillock, MD   0.25 mg at 01/20/16 1000  . sodium polystyrene (KAYEXALATE) 15 GM/60ML suspension 30 g  30 g Oral Once Oswald Hillock, MD      . traMADol Veatrice Bourbon) tablet 50-100 mg  50-100 mg Oral Q6H PRN Lisette Abu, PA-C   100 mg at 01/20/16 [phone]  Discharge Medications: Please see discharge summary for a list of discharge medications.  Relevant Imaging Results:  Relevant Lab Results:   Additional Information SS#: [id number]  Junie Spencer, LCSW

## 2016-01-20 NOTE — Progress Notes (Signed)
Patient's b/p elevated at this time- NP Lynch notified.

## 2016-01-20 NOTE — Progress Notes (Signed)
Hypoglycemic Event  CBG: 69  Treatment: 15 GM carbohydrate snack  Symptoms: Sweaty  Follow-up CBG: Time:2:00am CBG Result:163  Possible Reasons for Event: Inadequate meal intake  Comments/MD notified:NP Lynch- fluids started    Marisa [name]

## 2016-01-20 NOTE — Progress Notes (Signed)
PROGRESS NOTE  THEDORA PANCOAST [id number] DOB: 1940-11-26 DOA: 01/18/2016 PCP: Jani Gravel, MD   LOS: 2 days   Brief Narrative:  Marisa [name]  is a 75 y.o. female, With history of hypertension, COPD on 2 L home oxygen, diabetes mellitus, CAD, chronic anemia, Who came to the ED after patient had a mechanical fall at home hitting right side of chest wall. Patient complained of right lower ribs pain, etc. of showed multiple rib fractures on right. Patient denies passing out, no dizziness. She is not clear what made her fall. Patient was seen by trauma, and recommended transfer to [facility]. In the ED lab work revealed multiple lab abnormalities including hyponatremia, hyperkalemia, anemia. Patient denies shortness of breath. No fever or dysuria. No nausea vomiting or diarrhea.  Assessment & Plan: Active Problems:   Anemia, normocytic normochromic   Hyponatremia   Diabetes mellitus type 2, controlled (HCC)   RA (rheumatoid arthritis) (HCC)   Multiple rib fractures   Rib fractures   Multiple rib fractures  - patient seen by trauma surgery, continue incentive spirometry, pain control.  - no acute interventions needed  Hyponatremia - likely dehydration / poor solute intake with low urine sodium?  - improved with fluids  - monitor off of fluids. Encouraged po intake. Repeat Na this afternoon  Hyperkalemia - s/p 1 dose of Kayexalate 30 g by mouth 1 on 10/5, hold lisinopril.  - improved  Diabetes mellitus - we'll start sliding scale insulin with NovoLog.  CKD stage III - baseline creatinine 1.3, worsened to 2.2 on 10/6, improving today  - avoid NSAIDs, hold ACEI  COPD - stable, continue when necessary DuoNeb nebulizers, oxygen via [place]  Chronic anemia- patient has chronic anemia with baseline hemoglobin around 9. Today hemoglobin is stable at 8.4 Dipped to 7.7 on 10/6, likely due to IVF and re-equilibrated today   Rheumatoid arthritis - continue prednisone,  Arava.   DVT prophylaxis: Lovenox Code Status: DNR Family Communication: d/w husband at bedside Disposition Plan: likely SNF 1 days. Discussed with SW  Consultants:   Trauma surgery   Procedures:   None   Antimicrobials:  None    Subjective: - in pain, mainly left ribcage, worse with deep breathing.   Objective: Vitals:   01/20/16 0000 01/20/16 0032 01/20/16 0439 01/20/16 0617  BP: (!) 163/53 (!) 154/49 (!) 189/66 (!) 159/53  Pulse:   86 79  Resp:   18   Temp:   99.6 F (37.6 C)   TempSrc:   Oral   SpO2:   100%   Weight:      Height:        Intake/Output Summary (Last 24 hours) at 01/20/16 1143 Last data filed at 01/20/16 0700  Gross per 24 hour  Intake           258.33 ml  Output             2450 ml  Net         -2191.67 ml   Filed Weights   01/18/16 1535  Weight: 49.9 kg (110 lb)    Examination: Constitutional: NAD Vitals:   01/20/16 0000 01/20/16 0032 01/20/16 0439 01/20/16 0617  BP: (!) 163/53 (!) 154/49 (!) 189/66 (!) 159/53  Pulse:   86 79  Resp:   18   Temp:   99.6 F (37.6 C)   TempSrc:   Oral   SpO2:   100%   Weight:      Height:  Eyes: PERRL, lids and conjunctivae normal ENMT: Mucous membranes are moist. No oropharyngeal exudates Respiratory: clear to auscultation bilaterally, no wheezing, no crackles. Normal respiratory effort.  Cardiovascular: Regular rate and rhythm, no murmurs / rubs / gallops. No LE edema.  Abdomen: no tenderness. Bowel sounds positive.  Musculoskeletal: ulnar deviation of fingers Neurologic: non focal    Data Reviewed: I have personally reviewed following labs and imaging studies  CBC:  Recent Labs Lab 01/18/16 1111 01/19/16 0810 01/20/16 0417  WBC 12.1* 10.3 9.4  HGB 8.4* 7.7* 8.4*  HCT 24.7* 23.2* 25.8*  MCV 85.5 86.9 86.9  PLT 279 277 [id number]   Basic Metabolic Panel:  Recent Labs Lab 01/18/16 1111 01/19/16 0810 01/19/16 1625 01/20/16 0417  NA 119* 125* 126* 130*  K 5.5* 4.9 5.0 4.9   CL 82* 91* 93* 95*  CO2 24 20* 21* 27  GLUCOSE 312* 245* 311* 315*  BUN 25* 31* 27* 23*  CREATININE 1.39* 2.21* 2.02* 1.43*  CALCIUM 8.0* 7.7* 7.8* 8.1*   GFR: Estimated Creatinine Clearance: 22 mL/min (by C-G formula based on SCr of 1.43 mg/dL (H)). Liver Function Tests:  Recent Labs Lab 01/18/16 1111  AST 19  ALT 14  ALKPHOS 75  BILITOT 1.5*  PROT 5.7*  ALBUMIN 3.2*   No results for input(s): LIPASE, AMYLASE in the last 168 hours. No results for input(s): AMMONIA in the last 168 hours. Coagulation Profile: No results for input(s): INR, PROTIME in the last 168 hours. Cardiac Enzymes: No results for input(s): CKTOTAL, CKMB, CKMBINDEX, TROPONINI in the last 168 hours. BNP (last 3 results) No results for input(s): PROBNP in the last 8760 hours. HbA1C:  Recent Labs  01/18/16 1638  HGBA1C 6.4*   CBG:  Recent Labs Lab 01/20/16 0118 01/20/16 0140 01/20/16 0200 01/20/16 0636 01/20/16 1128  GLUCAP 32* 69 163* 370* 120*   Lipid Profile: No results for input(s): CHOL, HDL, LDLCALC, TRIG, CHOLHDL, LDLDIRECT in the last 72 hours. Thyroid Function Tests:  Recent Labs  01/18/16 1629  TSH 1.657   Anemia Panel: No results for input(s): VITAMINB12, FOLATE, FERRITIN, TIBC, IRON, RETICCTPCT in the last 72 hours. Urine analysis:    Component Value Date/Time   COLORURINE YELLOW 01/18/2016 1131   APPEARANCEUR CLOUDY (A) 01/18/2016 1131   LABSPEC 1.014 01/18/2016 1131   PHURINE 5.5 01/18/2016 1131   GLUCOSEU 100 (A) 01/18/2016 1131   HGBUR NEGATIVE 01/18/2016 1131   BILIRUBINUR NEGATIVE 01/18/2016 1131   KETONESUR 15 (A) 01/18/2016 1131   PROTEINUR 100 (A) 01/18/2016 1131   UROBILINOGEN 0.2 05/28/2014 1021   NITRITE NEGATIVE 01/18/2016 1131   LEUKOCYTESUR NEGATIVE 01/18/2016 1131   Sepsis Labs: Invalid input(s): PROCALCITONIN, LACTICIDVEN  No results found for this or any previous visit (from the past 240 hour(s)).    Radiology Studies: Dg Chest Port 1  View  Result Date: 01/19/2016 CLINICAL DATA:  Status post fall 3 days ago with multiple right rib fractures. Shortness of breath. History of coronary artery disease and previous MI, asthma-COPD, rheumatoid arthritis. EXAM: PORTABLE CHEST 1 VIEW COMPARISON:  Chest x-ray of January 18, 2016 FINDINGS: The lungs are well-expanded. There is no pneumothorax. Fractures of the posterior lateral aspects of the right seventh, eighth, and ninth ribs are observed. There is no pleural effusion. The heart is top-normal in size. The pulmonary vascularity is normal. There is calcification in the wall of the aortic arch. 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 There is multilevel degenerative disc disease of the thoracic spine with  gentle dextrocurvature centered in the mid thoracic spine. IMPRESSION: Hyperinflation consistent with COPD. No pneumothorax, pleural effusion, or pneumonia. No CHF.  Aortic atherosclerosis. Fractures of the seventh through ninth right lateral ribs. 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 Multilevel degenerative disc disease of the thoracic spine. Electronically Signed   By: David  Martinique M.D.   On: 01/19/2016 07:40     Scheduled Meds: . amLODipine  5 mg Oral Daily  . donepezil  10 mg Oral QHS  . famotidine  10 mg Oral BID  . insulin aspart  0-9 Units Subcutaneous TID WC  . insulin detemir  16 Units Subcutaneous Q1200  . leflunomide  20 mg Oral Daily  . metoprolol succinate  100 mg Oral Daily  . polyethylene glycol  17 g Oral BID  . predniSONE  10 mg Oral Q breakfast  . pregabalin  75 mg Oral TID  . risperiDONE  0.25 mg Oral BID  . sodium polystyrene  30 g Oral Once   Continuous Infusions:    Marzetta Board, MD, PhD [facility] Pager [phone]  If 7PM-7AM, please contact night-coverage [email or web address] Password [email or web address] 01/20/2016, 11:43 AM

## 2016-01-20 NOTE — Progress Notes (Signed)
B/p medication given- next RN will recheck pt's b/p for the morning.

## 2016-01-20 NOTE — Progress Notes (Signed)
Patient unable to void bladder scanned for 524 .Text paged NP [place].Order received for in and cath x 1.Patient in and out cath for 750 cc of urine .Will continue to monitor and report off to oncoming nurse.

## 2016-01-20 NOTE — Progress Notes (Signed)
  Subjective: In bed. Appropriate by quite deconditioned, flat affect Vital capacity no more than 400 cc Constipated  Objective: Vital signs in last 24 hours: Temp:  [98.7 F (37.1 C)-99.6 F (37.6 C)] 99.6 F (37.6 C) (10/07 0439) Pulse Rate:  [79-119] 79 (10/07 0617) Resp:  [16-18] 18 (10/07 0439) BP: (135-189)/(45-84) 159/53 (10/07 0617) SpO2:  [100 %] 100 % (10/07 0439) Last BM Date: 01/18/16  Intake/Output from previous day: 10/06 0701 - 10/07 0700 In: 258.3 [I.V.:258.3] Out: 3100 [Urine:3100] Intake/Output this shift: No intake/output data recorded.  General appearance: in bed. inactive deconditioned . Interacts when forced to. Husband present Resp: VC 400 cc. no wheeze. tender on L. GI: soft, non-tender; bowel sounds normal; no masses,  no organomegaly  Lab Results:   Recent Labs  01/19/16 0810 01/20/16 0417  WBC 10.3 9.4  HGB 7.7* 8.4*  HCT 23.2* 25.8*  PLT 277 259   BMET  Recent Labs  01/19/16 1625 01/20/16 0417  NA 126* 130*  K 5.0 4.9  CL 93* 95*  CO2 21* 27  GLUCOSE 311* 315*  BUN 27* 23*  CREATININE 2.02* 1.43*  CALCIUM 7.8* 8.1*   PT/INR No results for input(s): LABPROT, INR in the last 72 hours. ABG No results for input(s): PHART, HCO3 in the last 72 hours.  Invalid input(s): PCO2, PO2  Studies/Results: Dg Chest Port 1 View  Result Date: 01/19/2016 CLINICAL DATA:  Status post fall 3 days ago with multiple right rib fractures. Shortness of breath. History of coronary artery disease and previous MI, asthma-COPD, rheumatoid arthritis. EXAM: PORTABLE CHEST 1 VIEW COMPARISON:  Chest x-ray of January 18, 2016 FINDINGS: The lungs are well-expanded. There is no pneumothorax. Fractures of the posterior lateral aspects of the right seventh, eighth, and ninth ribs are observed. There is no pleural effusion. The heart is top-normal in size. The pulmonary vascularity is normal. There is calcification in the wall of the aortic arch. There is chronic  dislocation of both shoulders. There is multilevel degenerative disc disease of the thoracic spine with gentle dextrocurvature centered in the mid thoracic spine. IMPRESSION: Hyperinflation consistent with COPD. No pneumothorax, pleural effusion, or pneumonia. No CHF.  Aortic atherosclerosis. Fractures of the seventh through ninth right lateral ribs. 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 Multilevel degenerative disc disease of the thoracic spine. Electronically Signed   By: David  Martinique M.D.   On: 01/19/2016 07:40    Anti-infectives: Anti-infectives    None      Assessment/Plan:  Fall Multiple right rib fxs -- Pulmonary toilet. Would recommend non-narcotic pain medication at this age, will change to tramadol. If that fails to control pain adequately would consider scheduling tramadol to decrease narcotic burden. Would also add NSAID if not contraindicated by medical issues. Will leave that to primary team.  OOB much more to improve VC Miralax BID   LOS: 2 days    Lavonya Hoerner M 01/20/2016

## 2016-01-21 LAB — BASIC METABOLIC PANEL
Anion gap: 6 (ref 5–15)
BUN: 24 mg/dL — ABNORMAL HIGH (ref 6–20)
CHLORIDE: 96 mmol/L — AB (ref 101–111)
CO2: 29 mmol/L (ref 22–32)
Calcium: 8.1 mg/dL — ABNORMAL LOW (ref 8.9–10.3)
Creatinine, Ser: 1.56 mg/dL — ABNORMAL HIGH (ref 0.44–1.00)
GFR calc non Af Amer: 32 mL/min — ABNORMAL LOW (ref >60)
GFR, EST AFRICAN AMERICAN: 37 mL/min — AB (ref >60)
Glucose, Bld: 198 mg/dL — ABNORMAL HIGH (ref 65–99)
POTASSIUM: 5 mmol/L (ref 3.5–5.1)
SODIUM: 131 mmol/L — AB (ref 135–145)

## 2016-01-21 LAB — GLUCOSE, CAPILLARY
GLUCOSE-CAPILLARY: 255 mg/dL — AB (ref 65–99)
Glucose-Capillary: 157 mg/dL — ABNORMAL HIGH (ref 65–99)
Glucose-Capillary: 131 mg/dL — ABNORMAL HIGH (ref 65–99)
Glucose-Capillary: 397 mg/dL — ABNORMAL HIGH (ref 65–99)
Glucose-Capillary: 150 mg/dL — ABNORMAL HIGH (ref 65–99)

## 2016-01-21 NOTE — Progress Notes (Signed)
PROGRESS NOTE  [name] [id number] DOB: 09/24/1940 DOA: 01/18/2016 PCP: Marisa Gravel, MD   LOS: 3 days   Brief Narrative:  Marisa [name]  is a 75 y.o. female, With history of hypertension, COPD on 2 L home oxygen, diabetes mellitus, CAD, chronic anemia, Who came to the ED after patient had a mechanical fall at home hitting right side of chest wall. Patient complained of right lower ribs pain, etc. of showed multiple rib fractures on right. Patient denies passing out, no dizziness. She is not clear what made her fall. Patient was seen by trauma, and recommended transfer to [facility]. In the ED lab work revealed multiple lab abnormalities including hyponatremia, hyperkalemia, anemia. Patient denies shortness of breath. No fever or dysuria. No nausea vomiting or diarrhea.  Assessment & Plan: Active Problems:   Anemia, normocytic normochromic   Hyponatremia   Diabetes mellitus type 2, controlled (HCC)   RA (rheumatoid arthritis) (HCC)   Multiple rib fractures   Rib fractures   Multiple rib fractures  - patient seen by trauma surgery, continue incentive spirometry, pain control.  - no acute interventions needed  Hyponatremia - likely dehydration / poor solute intake with low urine sodium?  - improved  Hyperkalemia - s/p 1 dose of Kayexalate 30 g by mouth 1 on 10/5, hold lisinopril.  - improved  Diabetes mellitus - sliding scale insulin with NovoLog.  CKD stage III - baseline creatinine 1.3, worsened to 2.2 on 10/6, improving today  - avoid NSAIDs, hold ACEI  COPD - stable, continue when necessary DuoNeb nebulizers, oxygen via [place]  Chronic anemia - Hb stable  Rheumatoid arthritis - continue prednisone, Arava.   DVT prophylaxis: Lovenox Code Status: DNR Family Communication: d/w husband at bedside Disposition Plan: SNF 1 days. Discussed with SW  Consultants:   Trauma surgery   Procedures:   None   Antimicrobials:  None    Subjective: - in  pain, mainly left ribcage, worse with deep breathing.   Objective: Vitals:   01/20/16 2146 01/20/16 2226 01/21/16 0401 01/21/16 1243  BP: (!) 176/58  (!) 155/35 138/65  Pulse:   63 78  Resp:   16 18  Temp:   97.7 F (36.5 C) 97.6 F (36.4 C)  TempSrc:   Oral Oral  SpO2:  98% 100% 100%  Weight:      Height:        Intake/Output Summary (Last 24 hours) at 01/21/16 1500 Last data filed at 01/21/16 0824  Gross per 24 hour  Intake              120 ml  Output              600 ml  Net             -480 ml   Filed Weights   01/18/16 1535  Weight: 49.9 kg (110 lb)    Examination: Constitutional: NAD Vitals:   01/20/16 2146 01/20/16 2226 01/21/16 0401 01/21/16 1243  BP: (!) 176/58  (!) 155/35 138/65  Pulse:   63 78  Resp:   16 18  Temp:   97.7 F (36.5 C) 97.6 F (36.4 C)  TempSrc:   Oral Oral  SpO2:  98% 100% 100%  Weight:      Height:        Respiratory: clear to auscultation bilaterally, no wheezing, no crackles. Normal respiratory effort.  Cardiovascular: Regular rate and rhythm, no murmurs / rubs / gallops. No LE edema.  Data Reviewed: I have personally reviewed following labs and imaging studies  CBC:  Recent Labs Lab 01/18/16 1111 01/19/16 0810 01/20/16 0417  WBC 12.1* 10.3 9.4  HGB 8.4* 7.7* 8.4*  HCT 24.7* 23.2* 25.8*  MCV 85.5 86.9 86.9  PLT 279 277 [id number]   Basic Metabolic Panel:  Recent Labs Lab 01/19/16 0810 01/19/16 1625 01/20/16 0417 01/20/16 1525 01/21/16 0358  NA 125* 126* 130* 129* 131*  K 4.9 5.0 4.9 5.8* 5.0  CL 91* 93* 95* 96* 96*  CO2 20* 21* 27 29 29   GLUCOSE 245* 311* 315* 263* 198*  BUN 31* 27* 23* 23* 24*  CREATININE 2.21* 2.02* 1.43* 1.52* 1.56*  CALCIUM 7.7* 7.8* 8.1* 8.4* 8.1*   GFR: Estimated Creatinine Clearance: 20.2 mL/min (by C-G formula based on SCr of 1.56 mg/dL (H)). Liver Function Tests:  Recent Labs Lab 01/18/16 1111  AST 19  ALT 14  ALKPHOS 75  BILITOT 1.5*  PROT 5.7*  ALBUMIN 3.2*   No results  for input(s): LIPASE, AMYLASE in the last 168 hours. No results for input(s): AMMONIA in the last 168 hours. Coagulation Profile: No results for input(s): INR, PROTIME in the last 168 hours. Cardiac Enzymes: No results for input(s): CKTOTAL, CKMB, CKMBINDEX, TROPONINI in the last 168 hours. BNP (last 3 results) No results for input(s): PROBNP in the last 8760 hours. HbA1C:  Recent Labs  01/18/16 1638  HGBA1C 6.4*   CBG:  Recent Labs Lab 01/20/16 1643 01/20/16 2149 01/21/16 0028 01/21/16 0558 01/21/16 1138  GLUCAP 311* 299* 255* 157* 131*   Lipid Profile: No results for input(s): CHOL, HDL, LDLCALC, TRIG, CHOLHDL, LDLDIRECT in the last 72 hours. Thyroid Function Tests:  Recent Labs  01/18/16 1629  TSH 1.657   Anemia Panel: No results for input(s): VITAMINB12, FOLATE, FERRITIN, TIBC, IRON, RETICCTPCT in the last 72 hours. Urine analysis:    Component Value Date/Time   COLORURINE YELLOW 01/18/2016 1131   APPEARANCEUR CLOUDY (A) 01/18/2016 1131   LABSPEC 1.014 01/18/2016 1131   PHURINE 5.5 01/18/2016 1131   GLUCOSEU 100 (A) 01/18/2016 1131   HGBUR NEGATIVE 01/18/2016 1131   BILIRUBINUR NEGATIVE 01/18/2016 1131   KETONESUR 15 (A) 01/18/2016 1131   PROTEINUR 100 (A) 01/18/2016 1131   UROBILINOGEN 0.2 05/28/2014 1021   NITRITE NEGATIVE 01/18/2016 1131   LEUKOCYTESUR NEGATIVE 01/18/2016 1131   Sepsis Labs: Invalid input(s): PROCALCITONIN, LACTICIDVEN  No results found for this or any previous visit (from the past 240 hour(s)).    Radiology Studies: No results found.   Scheduled Meds: . amLODipine  5 mg Oral Daily  . donepezil  10 mg Oral QHS  . famotidine  10 mg Oral BID  . insulin aspart  0-9 Units Subcutaneous TID WC  . insulin detemir  16 Units Subcutaneous Q1200  . leflunomide  20 mg Oral Daily  . metoprolol succinate  100 mg Oral Daily  . polyethylene glycol  17 g Oral BID  . predniSONE  10 mg Oral Q breakfast  . pregabalin  75 mg Oral TID  .  risperiDONE  0.25 mg Oral BID  . sodium polystyrene  30 g Oral Once   Continuous Infusions:    Marzetta Board, MD, PhD [facility] Pager [phone]  If 7PM-7AM, please contact night-coverage [email or web address] Password [email or web address] 01/21/2016, 3:00 PM

## 2016-01-21 NOTE — Progress Notes (Signed)
  Subjective: In bed.  More alert.  A little more animated and appropriate. Tolerating diet No new complaints or issues Vital capacity varies from 500-600 mL, a little better. No stool recorded.  Colace and MiraLAX ordered yesterday. SPO2 100% on 3L nasal cannula.  Objective: Vital signs in last 24 hours: Temp:  [97.7 F (36.5 C)-97.9 F (36.6 C)] 97.7 F (36.5 C) (10/08 0401) Pulse Rate:  [63-77] 63 (10/08 0401) Resp:  [16] 16 (10/08 0401) BP: (146-176)/(35-60) 155/35 (10/08 0401) SpO2:  [98 %-100 %] 100 % (10/08 0401) Last BM Date: 01/18/16  Intake/Output from previous day: 10/07 0701 - 10/08 0700 In: 120 [P.O.:120] Out: 600 [Urine:600] Intake/Output this shift: Total I/O In: 120 [P.O.:120] Out: -    General appearance: in bed.  deconditioned .  More alert and interactive Resp: VC 500-600 cc. no wheeze. tender on L. GI: soft, non-tender; bowel sounds normal; no masses,  no organomegaly    Lab Results:   Recent Labs  01/19/16 0810 01/20/16 0417  WBC 10.3 9.4  HGB 7.7* 8.4*  HCT 23.2* 25.8*  PLT 277 259   BMET  Recent Labs  01/20/16 1525 01/21/16 0358  NA 129* 131*  K 5.8* 5.0  CL 96* 96*  CO2 29 29  GLUCOSE 263* 198*  BUN 23* 24*  CREATININE 1.52* 1.56*  CALCIUM 8.4* 8.1*   PT/INR No results for input(s): LABPROT, INR in the last 72 hours. ABG No results for input(s): PHART, HCO3 in the last 72 hours.  Invalid input(s): PCO2, PO2  Studies/Results: No results found.  Anti-infectives: Anti-infectives    None      Assessment/Plan:  Fall Multiple right rib fxs -- Pulmonary toilet. Would recommend non-narcotic pain medication at this age, seems better on tramadol. If that fails to control pain adequately would consider scheduling tramadol to decrease narcotic burden. Would also add NSAID if not contraindicated by medical issues. Will leave that to primary team. Looks better today  OOB much more to improve VC Miralax BID   LOS: 3  days    Marisa [name] M 01/21/2016

## 2016-01-22 DIAGNOSIS — R2681 Unsteadiness on feet: Secondary | ICD-10-CM | POA: Diagnosis not present

## 2016-01-22 DIAGNOSIS — J4520 Mild intermittent asthma, uncomplicated: Secondary | ICD-10-CM | POA: Diagnosis not present

## 2016-01-22 DIAGNOSIS — J9611 Chronic respiratory failure with hypoxia: Secondary | ICD-10-CM | POA: Diagnosis not present

## 2016-01-22 DIAGNOSIS — E1140 Type 2 diabetes mellitus with diabetic neuropathy, unspecified: Secondary | ICD-10-CM | POA: Diagnosis not present

## 2016-01-22 DIAGNOSIS — E871 Hypo-osmolality and hyponatremia: Secondary | ICD-10-CM | POA: Diagnosis not present

## 2016-01-22 DIAGNOSIS — J309 Allergic rhinitis, unspecified: Secondary | ICD-10-CM | POA: Diagnosis not present

## 2016-01-22 DIAGNOSIS — S2241XD Multiple fractures of ribs, right side, subsequent encounter for fracture with routine healing: Secondary | ICD-10-CM | POA: Diagnosis not present

## 2016-01-22 DIAGNOSIS — I10 Essential (primary) hypertension: Secondary | ICD-10-CM | POA: Diagnosis not present

## 2016-01-22 DIAGNOSIS — Z5189 Encounter for other specified aftercare: Secondary | ICD-10-CM | POA: Diagnosis not present

## 2016-01-22 DIAGNOSIS — S2241XA Multiple fractures of ribs, right side, initial encounter for closed fracture: Secondary | ICD-10-CM | POA: Diagnosis not present

## 2016-01-22 DIAGNOSIS — S2241XS Multiple fractures of ribs, right side, sequela: Secondary | ICD-10-CM | POA: Diagnosis not present

## 2016-01-22 DIAGNOSIS — H04123 Dry eye syndrome of bilateral lacrimal glands: Secondary | ICD-10-CM | POA: Diagnosis not present

## 2016-01-22 DIAGNOSIS — R195 Other fecal abnormalities: Secondary | ICD-10-CM | POA: Diagnosis not present

## 2016-01-22 DIAGNOSIS — F0391 Unspecified dementia with behavioral disturbance: Secondary | ICD-10-CM | POA: Diagnosis not present

## 2016-01-22 DIAGNOSIS — R259 Unspecified abnormal involuntary movements: Secondary | ICD-10-CM | POA: Diagnosis not present

## 2016-01-22 DIAGNOSIS — M069 Rheumatoid arthritis, unspecified: Secondary | ICD-10-CM | POA: Diagnosis not present

## 2016-01-22 DIAGNOSIS — R110 Nausea: Secondary | ICD-10-CM | POA: Diagnosis not present

## 2016-01-22 DIAGNOSIS — R2689 Other abnormalities of gait and mobility: Secondary | ICD-10-CM | POA: Diagnosis not present

## 2016-01-22 DIAGNOSIS — S20211A Contusion of right front wall of thorax, initial encounter: Secondary | ICD-10-CM | POA: Diagnosis not present

## 2016-01-22 DIAGNOSIS — F411 Generalized anxiety disorder: Secondary | ICD-10-CM | POA: Diagnosis not present

## 2016-01-22 DIAGNOSIS — R109 Unspecified abdominal pain: Secondary | ICD-10-CM | POA: Diagnosis not present

## 2016-01-22 DIAGNOSIS — Z794 Long term (current) use of insulin: Secondary | ICD-10-CM | POA: Diagnosis not present

## 2016-01-22 DIAGNOSIS — K219 Gastro-esophageal reflux disease without esophagitis: Secondary | ICD-10-CM | POA: Diagnosis not present

## 2016-01-22 DIAGNOSIS — D649 Anemia, unspecified: Secondary | ICD-10-CM | POA: Diagnosis not present

## 2016-01-22 DIAGNOSIS — R5381 Other malaise: Secondary | ICD-10-CM | POA: Diagnosis not present

## 2016-01-22 DIAGNOSIS — K5900 Constipation, unspecified: Secondary | ICD-10-CM | POA: Diagnosis not present

## 2016-01-22 DIAGNOSIS — N183 Chronic kidney disease, stage 3 (moderate): Secondary | ICD-10-CM | POA: Diagnosis not present

## 2016-01-22 DIAGNOSIS — K648 Other hemorrhoids: Secondary | ICD-10-CM | POA: Diagnosis not present

## 2016-01-22 DIAGNOSIS — S2239XA Fracture of one rib, unspecified side, initial encounter for closed fracture: Secondary | ICD-10-CM | POA: Diagnosis not present

## 2016-01-22 DIAGNOSIS — R079 Chest pain, unspecified: Secondary | ICD-10-CM | POA: Diagnosis not present

## 2016-01-22 DIAGNOSIS — R278 Other lack of coordination: Secondary | ICD-10-CM | POA: Diagnosis not present

## 2016-01-22 DIAGNOSIS — F329 Major depressive disorder, single episode, unspecified: Secondary | ICD-10-CM | POA: Diagnosis not present

## 2016-01-22 DIAGNOSIS — D638 Anemia in other chronic diseases classified elsewhere: Secondary | ICD-10-CM | POA: Diagnosis not present

## 2016-01-22 LAB — GLUCOSE, CAPILLARY
GLUCOSE-CAPILLARY: 124 mg/dL — AB (ref 65–99)
Glucose-Capillary: 56 mg/dL — ABNORMAL LOW (ref 65–99)
Glucose-Capillary: 213 mg/dL — ABNORMAL HIGH (ref 65–99)
Glucose-Capillary: 130 mg/dL — ABNORMAL HIGH (ref 65–99)

## 2016-01-22 MED ORDER — LORAZEPAM 1 MG PO TABS: 0.5000 mg | ORAL_TABLET | ORAL | 0 refills | Status: DC | PRN

## 2016-01-22 MED ORDER — TRAMADOL HCL 50 MG PO TABS: 50.0000 mg | ORAL_TABLET | Freq: Three times a day (TID) | ORAL | 0 refills | Status: DC | PRN

## 2016-01-22 MED ORDER — HYDROCODONE-ACETAMINOPHEN 5-325 MG PO TABS: 0.5000 | ORAL_TABLET | ORAL | Status: DC | PRN

## 2016-01-22 MED ORDER — TRAMADOL HCL 50 MG PO TABS: 100.0000 mg | ORAL_TABLET | Freq: Four times a day (QID) | ORAL | Status: DC

## 2016-01-22 MED ORDER — INSULIN DETEMIR 100 UNIT/ML ~~LOC~~ SOLN
12.0000 [IU] | Freq: Every day | SUBCUTANEOUS | Status: DC
  Filled 2016-01-22: qty 0.12

## 2016-01-22 NOTE — Progress Notes (Signed)
Physical Therapy Treatment Patient Details Name: KENDA NAKASONE MRN: [id number] DOB: June 27, 1940 Today's Date: 01/22/2016    History of Present Illness 75 y.o. female s/p fall at home with resulting Rt rib fractures. PMH: chronic shoulder dislocation and degenerative changes, supraventricular tachycardia, seizure, RA, MI, HTN, diabetes, depression, asthma.    PT Comments    Pt continues to move slowly and requiring +2 assistance. Pt able to ambulate 6 ft with rw and min assist with chair follow for safety. Based upon the patient's current mobility level, continuing to recommend SNF for further rehabilitation following acute stay. Pt and family in agreement.   Follow Up Recommendations  SNF;Supervision for mobility/OOB     Equipment Recommendations   (to be addressed at next venue)    Recommendations for Other Services       Precautions / Restrictions Precautions Precautions: Fall Restrictions Weight Bearing Restrictions: No    Mobility  Bed Mobility Overal bed mobility: Needs Assistance Bed Mobility: Supine to Sit     Supine to sit: +2 for physical assistance;Mod assist     General bed mobility comments: Pt needing assistance with LEs and trunk to come to sitting. Using bed pad to assist with pivot to EOB.  Transfers Overall transfer level: Needs assistance Equipment used: Rolling walker (2 wheeled) Transfers: Sit to/from Stand Sit to Stand: Mod assist         General transfer comment: providing cues for hand placement  Ambulation/Gait Ambulation/Gait assistance: Min assist;+2 safety/equipment (chair follow for safety) Ambulation Distance (Feet): 6 Feet Assistive device: Rolling walker (2 wheeled) Gait Pattern/deviations: Step-through pattern;Shuffle;Trunk flexed Gait velocity: slow pattern   General Gait Details: shuffling patter, assist needed to advance rw, chair follow for safety. Pt requesting to sit due to pain.    Stairs            Wheelchair  Mobility    Modified Rankin (Stroke Patients Only)       Balance Overall balance assessment: Needs assistance Sitting-balance support: Bilateral upper extremity supported Sitting balance-Leahy Scale: Poor Sitting balance - Comments: requiring physical assist to maintain sitting balance   Standing balance support: Bilateral upper extremity supported Standing balance-Leahy Scale: Poor Standing balance comment: using rw and physical assist                    Cognition Arousal/Alertness: Lethargic Behavior During Therapy: Flat affect Overall Cognitive Status: Within Functional Limits for tasks assessed                      Exercises      General Comments        Pertinent Vitals/Pain Pain Assessment: Faces Faces Pain Scale: Hurts even more Pain Location: ribs Pain Descriptors / Indicators: Grimacing;Guarding (hurts) Pain Intervention(s): Monitored during session;Limited activity within patient's tolerance    Home Living                      Prior Function            PT Goals (current goals can now be found in the care plan section) Acute Rehab PT Goals Patient Stated Goal: not expressed PT Goal Formulation: With patient Time For Goal Achievement: 02/02/16 Potential to Achieve Goals: Fair Progress towards PT goals: Progressing toward goals    Frequency    Min 2X/week      PT Plan Current plan remains appropriate    Co-evaluation  End of Session Equipment Utilized During Treatment:  (3L) Activity Tolerance: Patient limited by pain Patient left: in chair;with call bell/phone within reach;with chair alarm set;with family/visitor present     Time: 1044-1101 PT Time Calculation (min) (ACUTE ONLY): 17 min  Charges:  $Therapeutic Activity: 8-22 mins                    G Codes:      Cassell Clement, PT, CSCS Pager [phone] Office [phone]  01/22/2016, 11:14 AM

## 2016-01-22 NOTE — Discharge Summary (Addendum)
Physician Discharge Summary  Marisa [name] [id number] DOB: July 19, 1940 DOA: 01/18/2016  PCP: Jani Gravel, MD  Admit date: 01/18/2016 Discharge date: 01/22/2016  Admitted From: home Disposition:  SNF  Recommendations for Outpatient Follow-up:  1. Follow up with PCP in 1-2 weeks 2. Continue Foley at d/c for urinary retention, remove in 2-3 days as patient will become more mobile.   Discharge Condition: stable CODE STATUS: DNR Diet recommendation: diabetic  HPI: Marisa [name]  is a 75 y.o. female, With history of hypertension, COPD on 2 L home oxygen, diabetes mellitus, CAD, chronic anemia, Who came to the ED after patient had a mechanical fall at home hitting right side of chest wall. Patient complained of right lower ribs pain, etc. of showed multiple rib fractures on right. Patient denies passing out, no dizziness. She is not clear what made her fall. Patient was seen by trauma, and recommended transfer to [facility]. In the ED lab work revealed multiple lab abnormalities including hyponatremia, hyperkalemia, anemia. Patient denies shortness of breath. No fever or dysuria. No nausea vomiting or diarrhea.  Hospital Course: Discharge Diagnoses:  Active Problems:   Anemia, normocytic normochromic   Hyponatremia   Diabetes mellitus type 2, controlled (HCC)   RA (rheumatoid arthritis) (HCC)   Multiple rib fractures   Rib fractures   Multiple rib fractures - patient seen by trauma surgery, continue incentive spirometry, pain control.No acute interventions needed Hyponatremia - likely dehydration / poor solute intake with low urine sodium?, improved, repeat a BMP in 3-4 days. Encourage po intake Hyperkalemia - s/p 1 dose of Kayexalate 30 g by mouth 1 on 10/5, hold lisinopril. Improved. Will hold Lisinopril on discharge as well given CKD. Diabetes mellitus - sliding scale insulin with NovoLog. Her Levemir was decreased due to a hypoglycemic episode CKD stage III - baseline  creatinine 1.3, worsened to 2.2 on 10/6, improved and has remained stable. Avoid NSAIDs, hold ACEI COPD - stable, continue when necessary DuoNeb nebulizers, oxygen via  Chronic anemia -Hb stable Rheumatoid arthritis - continue prednisone, Arava.   Discharge Instructions     Medication List    STOP taking these medications   lisinopril 40 MG tablet Commonly known as:  PRINIVIL,ZESTRIL   naproxen sodium 220 MG tablet Commonly known as:  ANAPROX     TAKE these medications   albuterol (5 MG/ML) 0.5% nebulizer solution Commonly known as:  PROVENTIL Take 2.5 mg by nebulization every 6 (six) hours as needed for wheezing or shortness of breath (shortness of breath).   AMBULATORY NON FORMULARY MEDICATION Continuous O2 @@ 2.5-3 LMP   BD PEN NEEDLE NANO U/F 32G X 4 MM Misc Generic drug:  Insulin Pen Needle   BILBERRY (VACCINIUM MYRTILLUS) PO Take 250 mg by mouth daily.   Co Q 10 100 MG Caps Take 100 mg by mouth daily.   diphenhydrAMINE 25 MG tablet Commonly known as:  BENADRYL Take 25 mg by mouth 3 (three) times daily as needed for allergies.   docusate sodium 100 MG capsule Commonly known as:  COLACE Take 100 mg by mouth every other day.   donepezil 10 MG tablet Commonly known as:  ARICEPT Take 1 tablet (10 mg total) by mouth at bedtime.   estrogens (conjugated) 0.625 MG tablet Commonly known as:  PREMARIN Take 0.625 mg by mouth daily.   fluticasone 50 MCG/ACT nasal spray Commonly known as:  FLONASE Place 2 sprays into both nostrils daily as needed for allergies or rhinitis.   FREESTYLE LITE  test strip Generic drug:  glucose blood TEST 10 TIMES DAILY ICD E11.65   GLUCOSAMINE & FISH OIL PO Take 1,000 mg by mouth 2 (two) times daily.   ICY HOT BACK 5 % Pads Generic drug:  Menthol (Topical Analgesic) Apply 1 patch topically daily as needed (joint pain).   insulin aspart 100 UNIT/ML injection Commonly known as:  novoLOG 0-9 Units, Subcutaneous, 3 times daily  with meals CBG < 70: implement hypoglycemia protocol CBG 70 - 120: 0 units CBG 121 - 150: 1 unit CBG 151 - 200: 2 units CBG 201 - 250: 3 units CBG 251 - 300: 5 units CBG 301 - 350: 7 units CBG 351 - 400: 9 units CBG > 400: call MD   insulin detemir 100 UNIT/ML injection Commonly known as:  LEVEMIR Inject 0.1 mLs (10 Units total) into the skin daily at 12 noon. What changed:  how much to take   INTEGRA PLUS Caps Take 1 capsule by mouth 2 (two) times daily. What changed:  when to take this   KRILL OIL PO Take 1 capsule by mouth daily.   leflunomide 20 MG tablet Commonly known as:  ARAVA Take 20 mg by mouth daily.   levalbuterol 45 MCG/ACT inhaler Commonly known as:  XOPENEX HFA Inhale 1-2 puffs into the lungs every 4 (four) hours as needed for wheezing (wheezing).   LORazepam 1 MG tablet Commonly known as:  ATIVAN Take 0.5 tablets (0.5 mg total) by mouth every 4 (four) hours as needed for anxiety.   metoprolol succinate 100 MG 24 hr tablet Commonly known as:  TOPROL-XL Take 100 mg by mouth daily. Take with or immediately following a meal.   MULTIVITAMINS PO Take 1 tablet by mouth. Daily.   predniSONE 5 MG tablet Commonly known as:  DELTASONE Take 10 mg by mouth daily.   pregabalin 75 MG capsule Commonly known as:  LYRICA Take 1 capsule (75 mg total) by mouth 3 (three) times daily.   promethazine 25 MG tablet Commonly known as:  PHENERGAN Take 1 tablet by mouth 3 (three) times daily as needed for vomiting.   ranitidine 150 MG tablet Commonly known as:  ZANTAC Take 150 mg by mouth 2 (two) times daily.   risperiDONE 0.5 MG tablet Commonly known as:  RISPERDAL Take 0.5 tablets (0.25 mg total) by mouth 2 (two) times daily. What changed:  how much to take   SYSTANE OP Apply 1 drop to eye daily as needed (dry eyes).   traMADol 50 MG tablet Commonly known as:  ULTRAM Take 1 tablet (50 mg total) by mouth 3 (three) times daily as needed for moderate pain.   VITAMIN A  PO Take 1 tablet by mouth daily.   vitamin C 500 MG tablet Commonly known as:  ASCORBIC ACID Take 500 mg by mouth 2 (two) times daily.   VITAMIN D PO Take 1 tablet by mouth daily.   VITAMIN K PO Take 1 tablet by mouth daily.   XIIDRA 5 % Soln Generic drug:  Lifitegrast Place 1 drop into both eyes 2 (two) times daily as needed (dry eyes).       Contact information for follow-up providers    [facility] .   Why:  Call as needed Contact information: [address] [id number] [place]           Contact information for after-discharge care    Destination    [facility] .  Specialty:  Skilled Nursing Facility Contact information: [place] [address] [phone]                 Allergies  Allergen Reactions  . Demerol [Meperidine] Other (See Comments)    Hallucinations  . Penicillins Swelling and Other (See Comments)    Throat swelling Has patient had a PCN reaction causing immediate rash, facial/tongue/throat swelling, SOB or lightheadedness with hypotension: Yes Has patient had a PCN reaction causing severe rash involving mucus membranes or skin necrosis: No Has patient had a PCN reaction that required hospitalization No Has patient had a PCN reaction occurring within the last 10 years: No If all of the above answers are "NO", then may proceed with Cephalosporin use.   . Enbrel [Etanercept] Swelling    Arm swelling   . Humira [Adalimumab] Swelling    Arm swelling     Consultations:  Trauma surgery   Procedures/Studies:  Dg Ribs Unilateral W/chest Right  Result Date: 01/18/2016 CLINICAL DATA:  Fall. EXAM: RIGHT RIBS AND CHEST - 3+ VIEW COMPARISON:  04/07/2015. FINDINGS: Mediastinum hilar structures are normal. Lungs are clear of infiltrates. Prominent skin fold noted over the left chest. No pleural effusion or pneumothorax.  Multiple right posterior, posterior-lateral common anterior lateral rib fractures are present. Stable right deformities both shoulders. IMPRESSION: Multiple posterior, posterior-lateral common anterior lateral right rib fractures are noted. No pneumothorax. Electronically Signed   By: Marcello Moores  Register   On: 01/18/2016 09:15   Dg Ankle Complete Left  Result Date: 01/09/2016 CLINICAL DATA:  Acute onset of left ankle bruising and pain. Initial encounter. EXAM: LEFT ANKLE COMPLETE - 3+ VIEW COMPARISON:  None. FINDINGS: There is no evidence of fracture or dislocation. The ankle mortise is intact; the interosseous space is within normal limits. No talar tilt or subluxation is seen. A plantar calcaneal spur is noted. The joint spaces are preserved. No significant soft tissue abnormalities are seen. IMPRESSION: No evidence of fracture or dislocation. Electronically Signed   By: Garald Balding M.D.   On: 01/09/2016 00:41   Dg Ankle Complete Right  Result Date: 01/09/2016 CLINICAL DATA:  Status post fall, with lateral right ankle swelling. Initial encounter. EXAM: RIGHT ANKLE - COMPLETE 3+ VIEW COMPARISON:  None. FINDINGS: There is no evidence of fracture or dislocation. There is mild chronic deformity of the distal fibula. There is chronic partial collapse of the subtalar joint, with mild fragmentation at the anterior talus. The ankle mortise is intact; the interosseous space is within normal limits. No talar tilt or subluxation is seen. A small os peroneum is noted. Mild lateral soft tissue swelling is noted. IMPRESSION: 1. No evidence of fracture or dislocation. 2. 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 Electronically Signed   By: Garald Balding M.D.   On: 01/09/2016 00:43   Dg Chest Port 1 View  Result Date: 01/19/2016 CLINICAL DATA:  Status post fall 3 days ago with multiple right rib fractures. Shortness of breath. History of coronary artery disease and  previous MI, asthma-COPD, rheumatoid arthritis. EXAM: PORTABLE CHEST 1 VIEW COMPARISON:  Chest x-ray of January 18, 2016 FINDINGS: The lungs are well-expanded. There is no pneumothorax. Fractures of the posterior lateral aspects of the right seventh, eighth, and ninth ribs are observed. There is no pleural effusion. The heart is top-normal in size. The pulmonary vascularity is normal. There is calcification in the wall of the aortic arch. There is chronic dislocation of  both shoulders. There is multilevel degenerative disc disease of the thoracic spine with gentle dextrocurvature centered in the mid thoracic spine. IMPRESSION: Hyperinflation consistent with COPD. No pneumothorax, pleural effusion, or pneumonia. No CHF.  Aortic atherosclerosis. Fractures of the seventh through ninth right lateral ribs. 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 Multilevel degenerative disc disease of the thoracic spine. Electronically Signed   By: David  Martinique M.D.   On: 01/19/2016 07:40   Dg Knee Complete 4 Views Left  Result Date: 01/09/2016 CLINICAL DATA:  Status post fall, with left knee pain. Initial encounter. EXAM: LEFT KNEE - COMPLETE 4+ VIEW COMPARISON:  None. FINDINGS: A thin osseous fragment is noted at the lateral aspect of the patella. This may reflect remote injury. The joint spaces are preserved. Sclerosis is noted at the femoral condyles bilaterally, with mild cortical irregularity along the lateral compartment. A fabella is noted. No significant joint effusion is seen. The visualized soft tissues are normal in appearance. IMPRESSION: 1. Thin osseous fragment at the lateral aspect of the patella. This may reflect remote injury. No definite evidence for acute fracture. 2. Degenerative change at the femoral condyles bilaterally, with mild cortical irregularity along the lateral compartment. Electronically Signed   By: Garald Balding M.D.   On: 01/09/2016 00:36   Dg Knee Complete 4 Views  Right  Result Date: 01/09/2016 CLINICAL DATA:  Status post fall, with right knee pain. Initial encounter. EXAM: RIGHT KNEE - COMPLETE 4+ VIEW COMPARISON:  None. FINDINGS: There is no evidence of fracture or dislocation. There is mild narrowing of the lateral compartment, with cortical irregularity at the lateral femoral condyle and lateral tibial plateau. Marginal osteophytes are seen arising at the medial compartment. A fabella is seen. A small knee joint effusion is noted. The visualized soft tissues are otherwise unremarkable in appearance. IMPRESSION: 1. No evidence of fracture or dislocation. 2. Small knee joint effusion noted. 3. Mild narrowing of the lateral compartment, with associated cortical irregularity. Electronically Signed   By: Garald Balding M.D.   On: 01/09/2016 00:38   Dg Hips Bilat W Or Wo Pelvis 3-4 Views  Result Date: 01/09/2016 CLINICAL DATA:  Status post fall, with left hip pain. Initial encounter. EXAM: DG HIP (WITH OR WITHOUT PELVIS) 3-4V BILAT COMPARISON:  None. FINDINGS: There is no evidence of fracture or dislocation. Both femoral heads are seated normally within their respective acetabula. The proximal femurs appear intact bilaterally. Degenerative change is noted along the lower lumbar spine. The sacroiliac joints are unremarkable in appearance. There is chronic deformity of the superior and inferior pubic rami bilaterally. The visualized bowel gas pattern is grossly unremarkable in appearance. Scattered phleboliths are noted within the pelvis. IMPRESSION: 1. No evidence of fracture or dislocation. 2. Chronic deformity of the superior and inferior pubic rami bilaterally. Electronically Signed   By: Garald Balding M.D.   On: 01/09/2016 00:45     Subjective: - no shortness of breath, no abdominal pain, nausea or vomiting.  - chest pain with breathing at level of rib fractures  Discharge Exam: Vitals:   01/22/16 0515 01/22/16 1418  BP: (!) 143/76 128/62  Pulse: 81 71   Resp:  15  Temp: 97.6 F (36.4 C) 99.9 F (37.7 C)   Vitals:   01/21/16 1243 01/21/16 2138 01/22/16 0515 01/22/16 1418  BP: 138/65 (!) 177/66 (!) 143/76 128/62  Pulse: 78 75 81 71  Resp: 18   15  Temp: 97.6 F (36.4 C) 97.8 F (36.6 C) 97.6  F (36.4 C) 99.9 F (37.7 C)  TempSrc: Oral Oral Oral Oral  SpO2: 100% 100% 98% 99%  Weight:      Height:        General: Pt is alert, awake, not in acute distress Cardiovascular: RRR, S1/S2 +, no rubs, no gallops Respiratory: CTA bilaterally, no wheezing, no rhonchi Abdominal: Soft, NT, ND, bowel sounds + Extremities: no edema, no cyanosis    The results of significant diagnostics from this hospitalization (including imaging, microbiology, ancillary and laboratory) are listed below for reference.     Microbiology: No results found for this or any previous visit (from the past 240 hour(s)).   Labs: BNP (last 3 results) No results for input(s): BNP in the last 8760 hours. Basic Metabolic Panel:  Recent Labs Lab 01/19/16 0810 01/19/16 1625 01/20/16 0417 01/20/16 1525 01/21/16 0358  NA 125* 126* 130* 129* 131*  K 4.9 5.0 4.9 5.8* 5.0  CL 91* 93* 95* 96* 96*  CO2 20* 21* 27 29 29   GLUCOSE 245* 311* 315* 263* 198*  BUN 31* 27* 23* 23* 24*  CREATININE 2.21* 2.02* 1.43* 1.52* 1.56*  CALCIUM 7.7* 7.8* 8.1* 8.4* 8.1*   Liver Function Tests:  Recent Labs Lab 01/18/16 1111  AST 19  ALT 14  ALKPHOS 75  BILITOT 1.5*  PROT 5.7*  ALBUMIN 3.2*   No results for input(s): LIPASE, AMYLASE in the last 168 hours. No results for input(s): AMMONIA in the last 168 hours. CBC:  Recent Labs Lab 01/18/16 1111 01/19/16 0810 01/20/16 0417  WBC 12.1* 10.3 9.4  HGB 8.4* 7.7* 8.4*  HCT 24.7* 23.2* 25.8*  MCV 85.5 86.9 86.9  PLT 279 277 259   Cardiac Enzymes: No results for input(s): CKTOTAL, CKMB, CKMBINDEX, TROPONINI in the last 168 hours. BNP: Invalid input(s): POCBNP CBG:  Recent Labs Lab 01/21/16 2144 01/22/16 0658  01/22/16 0741 01/22/16 1125 01/22/16 1216  GLUCAP 150* 56* 213* 124* 130*   D-Dimer No results for input(s): DDIMER in the last 72 hours. Hgb A1c No results for input(s): HGBA1C in the last 72 hours. Lipid Profile No results for input(s): CHOL, HDL, LDLCALC, TRIG, CHOLHDL, LDLDIRECT in the last 72 hours. Thyroid function studies No results for input(s): TSH, T4TOTAL, T3FREE, THYROIDAB in the last 72 hours.  Invalid input(s): FREET3 Anemia work up No results for input(s): VITAMINB12, FOLATE, FERRITIN, TIBC, IRON, RETICCTPCT in the last 72 hours. Urinalysis    Component Value Date/Time   COLORURINE YELLOW 01/18/2016 1131   APPEARANCEUR CLOUDY (A) 01/18/2016 1131   LABSPEC 1.014 01/18/2016 1131   PHURINE 5.5 01/18/2016 1131   GLUCOSEU 100 (A) 01/18/2016 1131   HGBUR NEGATIVE 01/18/2016 1131   BILIRUBINUR NEGATIVE 01/18/2016 1131   KETONESUR 15 (A) 01/18/2016 1131   PROTEINUR 100 (A) 01/18/2016 1131   UROBILINOGEN 0.2 05/28/2014 1021   NITRITE NEGATIVE 01/18/2016 1131   LEUKOCYTESUR NEGATIVE 01/18/2016 1131   Time coordinating discharge: Over 30 minutes  SIGNED:  Marzetta Board, MD  [facility] 01/22/2016, 3:17 PM Pager [phone]  If 7PM-7AM, please contact night-coverage [email or web address] Password [email or web address]

## 2016-01-22 NOTE — Progress Notes (Signed)
Patient ID: Marisa [name], female   DOB: 1940-08-01, 75 y.o.   MRN: [id number]   LOS: 4 days   Subjective: No new c/o.   Objective: Vital signs in last 24 hours: Temp:  [97.6 F (36.4 C)-97.8 F (36.6 C)] 97.6 F (36.4 C) (10/09 0515) Pulse Rate:  [75-81] 81 (10/09 0515) Resp:  [18] 18 (10/08 1243) BP: (138-177)/(65-76) 143/76 (10/09 0515) SpO2:  [98 %-100 %] 98 % (10/09 0515) Last BM Date: 01/18/16   Physical Exam General appearance: alert and no distress Resp: diminished breath sounds RUL Cardio: regular rate and rhythm GI: normal findings: bowel sounds normal and soft, non-tender   Assessment/Plan: Fall Multiple right rib fxs -- Pulmonary toilet. Does not look like tramadol is adequate to control pain. Will schedule it and add Norco back in. Understand she is to go to SNF today, ok with that. F/u with trauma service prn.    Lisette Abu, PA-C Pager: [phone] General Trauma PA Pager: [phone]  01/22/2016

## 2016-01-22 NOTE — Clinical Social Work Note (Signed)
Clinical Social Work Assessment  Patient Details  Name: Marisa [name] MRN: [id number] Date of Birth: 11-21-1940  Date of referral:  01/22/16               Reason for consult:  Facility Placement                Permission sought to share information with:   Acupuncturist) Permission granted to share information::   Acupuncturist)  Name::        Agency::     Relationship::     Contact Information:     Housing/Transportation Living arrangements for the past 2 months:  Single Family Home Source of Information:  Spouse Patient Interpreter Needed:  None Criminal Activity/Legal Involvement Pertinent to Current Situation/Hospitalization:  No - Comment as needed Significant Relationships:  Spouse (Marion/Husband [phone]) Lives with:  Spouse Do you feel safe going back to the place where you live?   (Family is interested in SNF) Need for family participation in patient care:  Yes (Comment)  Care giving concerns:  SW attempted to speak with pt at bedside. However, she was asleep. Husband was present. Husband states that he is interested in facility for patient and that pt needs assistance for ADLs.   Social Worker assessment / plan:  SW attempted to speak with patient at bedside. However, pt was asleep. Husband was present. Husband states that he is interested in facility for pt. Husband states that pt fell on last Tuesday. Husband states that before the pt fell she was able to walk around the house with a cane. However, he says when the pt was not in the house he would help transport the pt using equipment. The husband has selected [facility].  Employment status:  Retired Forensic scientist:  Medicare PT Recommendations:  [place] / Referral to community resources:   (SNF)  Patient/Family's Response to care:  Husband is appropriate.   Patient/Family's Understanding of and Emotional Response to Diagnosis, Current Treatment, and Prognosis:   Husband has no questions for SW.  Emotional Assessment Appearance:  Appears stated age Attitude/Demeanor/Rapport:   (Lethargic) Affect (typically observed):   (Lethargic) Orientation:  Fluctuating Orientation (Suspected and/or reported Sundowners) Alcohol / Substance use:  Not Applicable Psych involvement (Current and /or in the community):  No (Comment)  Discharge Needs  Concerns to be addressed:  No discharge needs identified Readmission within the last 30 days:  No Current discharge risk:  None Barriers to Discharge:  No Barriers Identified   Bernita Buffy 01/22/2016, 3:38 PM

## 2016-01-22 NOTE — Care Management Important Message (Signed)
Important Message  Patient Details  Name: Marisa [name] MRN: [id number] Date of Birth: [birth year]   Medicare Important Message Given:  Yes    [name] 01/22/2016, 12:02 PM

## 2016-01-22 NOTE — Progress Notes (Signed)
Hypoglycemic Event  CBG: 56  Treatment: 4 oz orange juice  Symptoms: tremors  Follow-up CBG: Time: 0730 CBG Result:213  Possible Reasons for Event: poor appetite  Comments/MD notified: 3 units of insulin given   Marisa [name] Lyn A

## 2016-01-22 NOTE — Progress Notes (Signed)
SW met with husband at bedside. SW gave bed choices. Husband selects [facility]. SW reached out to  Facility and spoke with admissions/Regina who confirms that pt is welcomed to come today.   SW made nurse aware. SW made husband aware.    Tilda Burrow, MSW [phone]

## 2016-01-22 NOTE — Clinical Social Work Placement (Signed)
   CLINICAL SOCIAL WORK PLACEMENT  NOTE  Date:  01/22/2016  Patient Details  Name: Marisa [name] MRN: [id number] Date of Birth: [birth year]  Clinical Social Work is seeking post-discharge placement for this patient at the [place] level of care (*CSW will initial, date and re-position this form in  chart as items are completed):  Yes   Patient/family provided with [facility] Work Department's list of facilities offering this level of care within the geographic area requested by the patient (or if unable, by the patient's family).  Yes   Patient/family informed of their freedom to choose among providers that offer the needed level of care, that participate in Medicare, Medicaid or managed care program needed by the patient, have an available bed and are willing to accept the patient.  Yes   Patient/family informed of [facility]'s ownership interest in [facility] and [facility], as well as of the fact that they are under no obligation to receive care at these facilities.  PASRR submitted to EDS on       PASRR number received on       Existing PASRR number confirmed on       FL2 transmitted to all facilities in geographic area requested by pt/family on       FL2 transmitted to all facilities within larger geographic area on       Patient informed that his/her managed care company has contracts with or will negotiate with certain facilities, including the following:        Yes   Patient/family informed of bed offers received.  Patient chooses bed at       Physician recommends and patient chooses bed at      Patient to be transferred to   on  .  Patient to be transferred to facility by       Patient family notified on 01/22/16 of transfer.  Name of family member notified:   (Husband/Marion)     PHYSICIAN       Additional Comment:    _______________________________________________ Bernita Buffy 01/22/2016, 3:43 PM

## 2016-01-22 NOTE — Clinical Social Work Note (Signed)
PASARR received [id number]  Nonnie Done, MSW, LCSW  ([id number]) [id number]  Licensed Clinical Social Worker

## 2016-01-23 ENCOUNTER — Encounter: Payer: Self-pay | Admitting: Internal Medicine

## 2016-01-23 ENCOUNTER — Non-Acute Institutional Stay (SKILLED_NURSING_FACILITY): Payer: Medicare Other | Admitting: Internal Medicine

## 2016-01-23 DIAGNOSIS — J4520 Mild intermittent asthma, uncomplicated: Secondary | ICD-10-CM

## 2016-01-23 DIAGNOSIS — K5900 Constipation, unspecified: Secondary | ICD-10-CM

## 2016-01-23 DIAGNOSIS — J9611 Chronic respiratory failure with hypoxia: Secondary | ICD-10-CM | POA: Diagnosis not present

## 2016-01-23 DIAGNOSIS — N183 Chronic kidney disease, stage 3 unspecified: Secondary | ICD-10-CM

## 2016-01-23 DIAGNOSIS — F411 Generalized anxiety disorder: Secondary | ICD-10-CM

## 2016-01-23 DIAGNOSIS — I10 Essential (primary) hypertension: Secondary | ICD-10-CM

## 2016-01-23 DIAGNOSIS — E1140 Type 2 diabetes mellitus with diabetic neuropathy, unspecified: Secondary | ICD-10-CM

## 2016-01-23 DIAGNOSIS — R339 Retention of urine, unspecified: Secondary | ICD-10-CM

## 2016-01-23 DIAGNOSIS — E871 Hypo-osmolality and hyponatremia: Secondary | ICD-10-CM | POA: Diagnosis not present

## 2016-01-23 DIAGNOSIS — F0391 Unspecified dementia with behavioral disturbance: Secondary | ICD-10-CM

## 2016-01-23 DIAGNOSIS — N2889 Other specified disorders of kidney and ureter: Secondary | ICD-10-CM

## 2016-01-23 DIAGNOSIS — R110 Nausea: Secondary | ICD-10-CM | POA: Diagnosis not present

## 2016-01-23 DIAGNOSIS — S2241XS Multiple fractures of ribs, right side, sequela: Secondary | ICD-10-CM | POA: Diagnosis not present

## 2016-01-23 DIAGNOSIS — R5381 Other malaise: Secondary | ICD-10-CM

## 2016-01-23 DIAGNOSIS — Z794 Long term (current) use of insulin: Secondary | ICD-10-CM

## 2016-01-23 DIAGNOSIS — K219 Gastro-esophageal reflux disease without esophagitis: Secondary | ICD-10-CM

## 2016-01-23 DIAGNOSIS — M069 Rheumatoid arthritis, unspecified: Secondary | ICD-10-CM

## 2016-01-23 DIAGNOSIS — D638 Anemia in other chronic diseases classified elsewhere: Secondary | ICD-10-CM

## 2016-01-23 NOTE — Progress Notes (Signed)
LOCATION: [place]  PCP: Jani Gravel, MD   Code Status: DNR  Goals of care: Advanced Directive information Advanced Directives 01/23/2016  Does patient have an advance directive? Yes  Type of Advance Directive Out of facility DNR (pink MOST or yellow form)  Does patient want to make changes to advanced directive? No - Patient declined  Copy of advanced directive(s) in chart? Yes  Would patient like information on creating an advanced directive? -  Pre-existing out of facility DNR order (yellow form or pink MOST form) -       Extended Emergency Contact Information Primary Emergency Contact: [facility] Address: [address]          [place], [place] [address] Johnnette Litter of [place] Phone: [phone] Mobile Phone: [phone] Relation: Spouse Secondary Emergency Contact: Ramsay,Suzanne  [place] Mobile Phone: [phone] Relation: Daughter   Allergies  Allergen Reactions  . Demerol [Meperidine] Other (See Comments)    Hallucinations  . Penicillins Swelling and Other (See Comments)    Throat swelling Has patient had a PCN reaction causing immediate rash, facial/tongue/throat swelling, SOB or lightheadedness with hypotension: Yes Has patient had a PCN reaction causing severe rash involving mucus membranes or skin necrosis: No Has patient had a PCN reaction that required hospitalization No Has patient had a PCN reaction occurring within the last 10 years: No If all of the above answers are "NO", then may proceed with Cephalosporin use.   . Enbrel [Etanercept] Swelling    Arm swelling   . Humira [Adalimumab] Swelling    Arm swelling     Chief Complaint  Patient presents with  . New Admit To SNF    New Admission Visit     HPI:  Patient is a 75 y.o. female seen today for short term rehabilitation post hospital admission from 01/18/16-01/22/16 with right sided chest pain post fall. She was found to have multiple right rib  fracture. Pain management and incentive spirometer was recommended. She has PMH of HTN, COPD on o2, CAD, CKD 3, RA among others. She is seen in her room with husband at bedside.   Review of Systems: limited participation with her dementia Constitutional: Negative for fever. Feels weak and tired. HENT: Negative for headache, congestion, nasal discharge. Eyes: Negative for double vision. Wears glasses. Respiratory: Negative for cough, shortness of breath. Positive for right sided chest pain with deep breathing    Cardiovascular: Negative for chest pain  Gastrointestinal: Negative for vomiting, abdominal pain. Last bowel movement was 3 days ago. She has nausea Genitourinary: Negative for dysuria Musculoskeletal: positive for cervical pain and joint pain    Past Medical History:  Diagnosis Date  . Anemia   . Arthritis   . Asthma    Home 3L O2   . Asthma   . Chronic anemia   . Depression    patient feels depressed at the end of the month  . Diabetes mellitus type 2, insulin dependent ([place])    initial diagnoses 11/2008  . Diabetes mellitus without complication ([place])   . Diabetic ketoacidosis ([place]) 11/2010  . Diverticulosis   . Esophageal stricture 06/2004   Dilation 06/2004  . Family history of anesthesia complication    DAUGHTER HAS NAUSEA  . GERD (gastroesophageal reflux disease)   . history of  pericarditis 12/2002  . History of blood transfusion   . Hypertension   . Myocardial infarction    Chest pain s/p normal cath in 08/2004 then NSTEMI during 11/2010 admission,  negative Myoview  . Osteoarthritis   . Oxygen deficiency   . Pericardial effusion   . Rectal bleeding 11/2010   Large hemorrhoids  . Rheumatoid arteritis   . Rheumatoid arthritis(714.0)    On MTX and chronic steroids  . Seizure ([place]) 11/2008  . Supraventricular tachycardia [facility])    Past Surgical History:  Procedure Laterality Date  . ABDOMINAL HYSTERECTOMY  1980  . CHOLECYSTECTOMY    . COLONOSCOPY N/A 01/06/2013     Procedure: COLONOSCOPY;  Surgeon: Ladene Artist, MD;  Location: [facility] ENDOSCOPY;  Service: Endoscopy;  Laterality: N/A;  . ESOPHAGOGASTRODUODENOSCOPY N/A 02/11/2013   Procedure: ESOPHAGOGASTRODUODENOSCOPY (EGD);  Surgeon: Irene Shipper, MD;  Location: [facility] ENDOSCOPY;  Service: Endoscopy;  Laterality: N/A;  . INCISE AND DRAIN ABCESS  09/2011   I&D of peri-rectal abcess per Dr Zella Richer.    Social History:   reports that she has never smoked. She has never used smokeless tobacco. She reports that she does not drink alcohol or use drugs.  Family History  Problem Relation Age of Onset  . Heart disease Mother   . Stroke Father   . Dementia Neg Hx     Medications:   Medication List       Accurate as of 01/23/16 12:44 PM. Always use your most recent med list.          albuterol (5 MG/ML) 0.5% nebulizer solution Commonly known as:  PROVENTIL Take 2.5 mg by nebulization every 6 (six) hours as needed for wheezing or shortness of breath (shortness of breath).   AMBULATORY NON FORMULARY MEDICATION Continuous O2 @@ 2.5-3 LMP   BD PEN NEEDLE NANO U/F 32G X 4 MM Misc Generic drug:  Insulin Pen Needle   Co Q 10 100 MG Caps Take 100 mg by mouth daily.   diphenhydrAMINE 25 MG tablet Commonly known as:  BENADRYL Take 25 mg by mouth daily as needed for allergies.   docusate sodium 100 MG capsule Commonly known as:  COLACE Take 100 mg by mouth every other day.   donepezil 10 MG tablet Commonly known as:  ARICEPT Take 1 tablet (10 mg total) by mouth at bedtime.   estrogens (conjugated) 0.625 MG tablet Commonly known as:  PREMARIN Take 0.625 mg by mouth daily.   fluticasone 50 MCG/ACT nasal spray Commonly known as:  FLONASE Place 2 sprays into both nostrils daily as needed for allergies or rhinitis.   FREESTYLE LITE test strip Generic drug:  glucose blood TEST 10 TIMES DAILY ICD E11.65   GLUCOSAMINE & FISH OIL PO Take 1,000 mg by mouth 2 (two) times daily.   insulin aspart  100 UNIT/ML injection Commonly known as:  novoLOG 0-9 Units, Subcutaneous, 3 times daily with meals CBG < 70: implement hypoglycemia protocol CBG 70 - 120: 0 units CBG 121 - 150: 1 unit CBG 151 - 200: 2 units CBG 201 - 250: 3 units CBG 251 - 300: 5 units CBG 301 - 350: 7 units CBG 351 - 400: 9 units CBG > 400: call MD   insulin detemir 100 UNIT/ML injection Commonly known as:  LEVEMIR Inject 0.1 mLs (10 Units total) into the skin daily at 12 noon.   INTEGRA PLUS Caps Take 1 capsule by mouth 2 (two) times daily.   leflunomide 20 MG tablet Commonly known as:  ARAVA Take 20 mg by mouth daily.   levalbuterol 45 MCG/ACT inhaler Commonly known as:  XOPENEX HFA Inhale 2 puffs into the lungs every 4 (four) hours as  needed for wheezing (wheezing).   LORazepam 1 MG tablet Commonly known as:  ATIVAN Take 0.5 tablets (0.5 mg total) by mouth every 4 (four) hours as needed for anxiety.   metoprolol succinate 100 MG 24 hr tablet Commonly known as:  TOPROL-XL Take 100 mg by mouth daily. Take with or immediately following a meal.   MULTIVITAMINS PO Take 1 tablet by mouth. Daily.   predniSONE 5 MG tablet Commonly known as:  DELTASONE Take 5 mg by mouth daily.   pregabalin 75 MG capsule Commonly known as:  LYRICA Take 1 capsule (75 mg total) by mouth 3 (three) times daily.   promethazine 25 MG tablet Commonly known as:  PHENERGAN Take 1 tablet by mouth every 8 (eight) hours as needed for vomiting.   ranitidine 150 MG tablet Commonly known as:  ZANTAC Take 150 mg by mouth 2 (two) times daily.   risperiDONE 0.5 MG tablet Commonly known as:  RISPERDAL Take 0.5 tablets (0.25 mg total) by mouth 2 (two) times daily.   SYSTANE OP Apply 1 drop to eye daily as needed (dry eyes).   traMADol 50 MG tablet Commonly known as:  ULTRAM Take 1 tablet (50 mg total) by mouth 3 (three) times daily as needed for moderate pain.   vitamin C 500 MG tablet Commonly known as:  ASCORBIC ACID Take 500 mg  by mouth 2 (two) times daily.   VITAMIN D PO Take 1 tablet by mouth daily.   XIIDRA 5 % Soln Generic drug:  Lifitegrast Place 1 drop into both eyes 2 (two) times daily as needed (dry eyes).       Immunizations: Immunization History  Administered Date(s) Administered  . Influenza Split 02/27/2012, 02/03/2013  . Influenza,inj,Quad PF,36+ Mos 01/05/2014     Physical Exam:  Vitals:   01/23/16 1233  BP: (!) 165/86  Pulse: 85  Resp: 16  Temp: 98.9 F (37.2 C)  TempSrc: Oral  SpO2: 97%  Weight: 110 lb (49.9 kg)  Height: 4\' 7"  (1.397 m)   Body mass index is 25.57 kg/m.  General- elderly female, well built, in no acute distress, ill appearing Head- normocephalic, atraumatic Nose- no nasal discharge Throat- moist mucus membrane Eyes- no pallor, no icterus, no discharge, normal conjunctiva, normal sclera Neck- no cervical lymphadenopathy Cardiovascular- normal s1,s2, + murmur, trace leg edema Respiratory- bilateral poor air entry, + wheeze, no rhonchi, no crackles, on o2 by nasal canula Abdomen- bowel sounds present, soft, non tender, foley catheter in place Musculoskeletal- able to move all 4 extremities, generalized weakness Neurological- alert and oriented to self Skin- warm and dry Psychiatry- appears anxious    Labs reviewed: Basic Metabolic Panel:  Recent Labs  04/07/15 1223  01/20/16 0417 01/20/16 1525 01/21/16 0358  NA 133*  < > 130* 129* 131*  K 5.0  < > 4.9 5.8* 5.0  CL 94*  < > 95* 96* 96*  CO2 28  < > 27 29 29   GLUCOSE 197*  < > 315* 263* 198*  BUN 20  < > 23* 23* 24*  CREATININE 1.33*  < > 1.43* 1.52* 1.56*  CALCIUM 8.5*  < > 8.1* 8.4* 8.1*  MG 2.3  --   --   --   --   < > = values in this interval not displayed. Liver Function Tests:  Recent Labs  04/07/15 1223 04/08/15 0515 01/18/16 1111  AST 21 16 19   ALT 17 17 14   ALKPHOS 263* 243* 75  BILITOT 0.4 1.3* 1.5*  PROT 6.3* 6.5 5.7*  ALBUMIN 3.2* 3.1* 3.2*   No results for  input(s): LIPASE, AMYLASE in the last 8760 hours. No results for input(s): AMMONIA in the last 8760 hours. CBC:  Recent Labs  10/19/15 1510 11/23/15 1521 12/28/15 1513 01/18/16 1111 01/19/16 0810 01/20/16 0417  WBC 9.3 10.0 9.8 12.1* 10.3 9.4  NEUTROABS 8.6* 9.4* 9.0*  --   --   --   HGB 9.2* 9.0* 9.2* 8.4* 7.7* 8.4*  HCT 27.4* 27.6* 28.2* 24.7* 23.2* 25.8*  MCV 87.6 87.1 85.8 85.5 86.9 86.9  PLT 239 218 253 279 277 259   Cardiac Enzymes:  Recent Labs  04/07/15 2306 04/08/15 0515 04/08/15 1126  TROPONINI 0.05* 0.10* 0.06*   BNP: Invalid input(s): POCBNP CBG:  Recent Labs  01/22/16 0741 01/22/16 1125 01/22/16 1216  GLUCAP 213* 124* 130*    Radiological Exams: Dg Ribs Unilateral W/chest Right  Result Date: 01/18/2016 CLINICAL DATA:  Fall. EXAM: RIGHT RIBS AND CHEST - 3+ VIEW COMPARISON:  04/07/2015. FINDINGS: Mediastinum hilar structures are normal. Lungs are clear of infiltrates. Prominent skin fold noted over the left chest. No pleural effusion or pneumothorax. Multiple right posterior, posterior-lateral common anterior lateral rib fractures are present. Stable right deformities both shoulders. IMPRESSION: Multiple posterior, posterior-lateral common anterior lateral right rib fractures are noted. No pneumothorax. Electronically Signed   By: Marcello Moores  Register   On: 01/18/2016 09:15   Dg Ankle Complete Left  Result Date: 01/09/2016 CLINICAL DATA:  Acute onset of left ankle bruising and pain. Initial encounter. EXAM: LEFT ANKLE COMPLETE - 3+ VIEW COMPARISON:  None. FINDINGS: There is no evidence of fracture or dislocation. The ankle mortise is intact; the interosseous space is within normal limits. No talar tilt or subluxation is seen. A plantar calcaneal spur is noted. The joint spaces are preserved. No significant soft tissue abnormalities are seen. IMPRESSION: No evidence of fracture or dislocation. Electronically Signed   By: Garald Balding M.D.   On: 01/09/2016 00:41     Dg Ankle Complete Right  Result Date: 01/09/2016 CLINICAL DATA:  Status post fall, with lateral right ankle swelling. Initial encounter. EXAM: RIGHT ANKLE - COMPLETE 3+ VIEW COMPARISON:  None. FINDINGS: There is no evidence of fracture or dislocation. There is mild chronic deformity of the distal fibula. There is chronic partial collapse of the subtalar joint, with mild fragmentation at the anterior talus. The ankle mortise is intact; the interosseous space is within normal limits. No talar tilt or subluxation is seen. A small os peroneum is noted. Mild lateral soft tissue swelling is noted. IMPRESSION: 1. No evidence of fracture or dislocation. 2. 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 Electronically Signed   By: Garald Balding M.D.   On: 01/09/2016 00:43   Dg Chest Port 1 View  Result Date: 01/19/2016 CLINICAL DATA:  Status post fall 3 days ago with multiple right rib fractures. Shortness of breath. History of coronary artery disease and previous MI, asthma-COPD, rheumatoid arthritis. EXAM: PORTABLE CHEST 1 VIEW COMPARISON:  Chest x-ray of January 18, 2016 FINDINGS: The lungs are well-expanded. There is no pneumothorax. Fractures of the posterior lateral aspects of the right seventh, eighth, and ninth ribs are observed. There is no pleural effusion. The heart is top-normal in size. The pulmonary vascularity is normal. There is calcification in the wall of the aortic arch. 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 There is multilevel degenerative disc disease of the thoracic spine with  gentle dextrocurvature centered in the mid thoracic spine. IMPRESSION: Hyperinflation consistent with COPD. No pneumothorax, pleural effusion, or pneumonia. No CHF.  Aortic atherosclerosis. Fractures of the seventh through ninth right lateral ribs. 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 Multilevel degenerative disc disease of the thoracic  spine. Electronically Signed   By: David  Martinique M.D.   On: 01/19/2016 07:40   Dg Knee Complete 4 Views Left  Result Date: 01/09/2016 CLINICAL DATA:  Status post fall, with left knee pain. Initial encounter. EXAM: LEFT KNEE - COMPLETE 4+ VIEW COMPARISON:  None. FINDINGS: A thin osseous fragment is noted at the lateral aspect of the patella. This may reflect remote injury. The joint spaces are preserved. Sclerosis is noted at the femoral condyles bilaterally, with mild cortical irregularity along the lateral compartment. A fabella is noted. No significant joint effusion is seen. The visualized soft tissues are normal in appearance. IMPRESSION: 1. Thin osseous fragment at the lateral aspect of the patella. This may reflect remote injury. No definite evidence for acute fracture. 2. Degenerative change at the femoral condyles bilaterally, with mild cortical irregularity along the lateral compartment. Electronically Signed   By: Garald Balding M.D.   On: 01/09/2016 00:36   Dg Knee Complete 4 Views Right  Result Date: 01/09/2016 CLINICAL DATA:  Status post fall, with right knee pain. Initial encounter. EXAM: RIGHT KNEE - COMPLETE 4+ VIEW COMPARISON:  None. FINDINGS: There is no evidence of fracture or dislocation. There is mild narrowing of the lateral compartment, with cortical irregularity at the lateral femoral condyle and lateral tibial plateau. Marginal osteophytes are seen arising at the medial compartment. A fabella is seen. A small knee joint effusion is noted. The visualized soft tissues are otherwise unremarkable in appearance. IMPRESSION: 1. No evidence of fracture or dislocation. 2. Small knee joint effusion noted. 3. Mild narrowing of the lateral compartment, with associated cortical irregularity. Electronically Signed   By: Garald Balding M.D.   On: 01/09/2016 00:38   Dg Hips Bilat W Or Wo Pelvis 3-4 Views  Result Date: 01/09/2016 CLINICAL DATA:  Status post fall, with left hip pain. Initial  encounter. EXAM: DG HIP (WITH OR WITHOUT PELVIS) 3-4V BILAT COMPARISON:  None. FINDINGS: There is no evidence of fracture or dislocation. Both femoral heads are seated normally within their respective acetabula. The proximal femurs appear intact bilaterally. Degenerative change is noted along the lower lumbar spine. The sacroiliac joints are unremarkable in appearance. There is chronic deformity of the superior and inferior pubic rami bilaterally. The visualized bowel gas pattern is grossly unremarkable in appearance. Scattered phleboliths are noted within the pelvis. IMPRESSION: 1. No evidence of fracture or dislocation. 2. Chronic deformity of the superior and inferior pubic rami bilaterally. Electronically Signed   By: Garald Balding M.D.   On: 01/09/2016 00:45    Assessment/Plan  Physical deconditioning Will have her work with physical therapy and occupational therapy team to help with gait training and muscle strengthening exercises.fall precautions. Skin care. Encourage to be out of bed.   Rib fracture Right sided multiple rib fracture. On tramadol 50 mg tid prn. Complaints of pain. Start her on tylenol 1000 mg bid and continue prn tramadol. Get PMR consult. Encourage to use incentive spirometer  Nausea Currently on zantac. Denies heartburn. Continue prn phenergan and monitor  Hyponatremia Check bmp  Urinary retention Has foley catheter. Do voiding trial on 01/25/16 and evaluate further.   Protein calorie malnutrition RD to evaluate. Continue to encourage to eat.  Chronic respiratory failure On chronic oxygen. Continue her bronchodilators but with chnages as below  Dementia with behavioral disturbance Continue to provide supportive care. Fall precautions. Continue aricept 10 mg qhs. Get SLP to evaluate. Continue risperdal  Asthma Currently on albuterol neb prn with levalbuterol prn  HTN Monitor bp, continue toprol xl 100 mg daily  Anemia of chronic disease Monitor  cbc  gerd Continue zantac 150 mg bid  Constipation On colace 100 mg qod. Change this to colace 100 mg twice daily  Dm with neuropathy Lab Results  Component Value Date   HGBA1C 6.4 (H) 01/18/2016   Monitor cbg. Continue levemir 10 u daily with SSI. Continue lyrica  ckd stage 3 Monitor bmp  RA Continue chronic prednisone 5 mg daily and arava. Continue lyrica  GAD Continue lorazepam 0.5 mg q4h prn and monitor   Goals of care: short term rehabilitation   Labs/tests ordered: cbc, cmp  Family/ staff Communication: reviewed care plan with patient and nursing supervisor    Blanchie Serve, MD Internal Medicine [place], [place] [address] Cell Phone (Monday-Friday 8 am - 5 pm): [phone] On Call: [phone] and follow prompts after 5 pm and on weekends Office Phone: [phone] Office Fax: [phone]

## 2016-01-25 ENCOUNTER — Telehealth: Payer: Self-pay | Admitting: Oncology

## 2016-01-25 DIAGNOSIS — R2681 Unsteadiness on feet: Secondary | ICD-10-CM | POA: Diagnosis not present

## 2016-01-25 DIAGNOSIS — S20211A Contusion of right front wall of thorax, initial encounter: Secondary | ICD-10-CM | POA: Diagnosis not present

## 2016-01-25 DIAGNOSIS — Z5189 Encounter for other specified aftercare: Secondary | ICD-10-CM | POA: Diagnosis not present

## 2016-01-25 DIAGNOSIS — R079 Chest pain, unspecified: Secondary | ICD-10-CM | POA: Diagnosis not present

## 2016-01-25 NOTE — Telephone Encounter (Signed)
Marisa [name] called to cancel appt. Will call back to reschedule. 01/25/16

## 2016-01-26 ENCOUNTER — Encounter: Payer: Self-pay | Admitting: Adult Health

## 2016-01-26 ENCOUNTER — Non-Acute Institutional Stay (SKILLED_NURSING_FACILITY): Payer: Medicare Other | Admitting: Adult Health

## 2016-01-26 DIAGNOSIS — Z5189 Encounter for other specified aftercare: Secondary | ICD-10-CM | POA: Diagnosis not present

## 2016-01-26 DIAGNOSIS — Z794 Long term (current) use of insulin: Secondary | ICD-10-CM

## 2016-01-26 DIAGNOSIS — R2681 Unsteadiness on feet: Secondary | ICD-10-CM | POA: Diagnosis not present

## 2016-01-26 DIAGNOSIS — S20211A Contusion of right front wall of thorax, initial encounter: Secondary | ICD-10-CM | POA: Diagnosis not present

## 2016-01-26 DIAGNOSIS — R079 Chest pain, unspecified: Secondary | ICD-10-CM | POA: Diagnosis not present

## 2016-01-26 DIAGNOSIS — E1140 Type 2 diabetes mellitus with diabetic neuropathy, unspecified: Secondary | ICD-10-CM | POA: Diagnosis not present

## 2016-01-26 NOTE — Progress Notes (Signed)
Patient ID: Marisa [name], female   DOB: 12-29-1940, 75 y.o.   MRN: [id number]    DATE:  01/26/16  MRN:  [id number]  BIRTHDAY: 06-20-1940  Facility:  [facility] Home Location:  [place] Room Number: 1206-P  LEVEL OF CARE:  SNF (31)  Contact Information    Name [place] Spouse [phone]  [phone]   Ramsay,Suzanne Daughter   [phone]       Code Status History    Date Active Date Inactive Code Status Order ID Comments User Context   01/18/2016  2:59 PM 01/22/2016  7:48 PM DNR [id number]  Oswald Hillock, MD ED   04/07/2015  4:07 PM 04/08/2015  5:27 PM Full Code [id number]  Donne Hazel, MD Inpatient   05/28/2014  8:22 AM 05/30/2014  8:05 PM Full Code [id number]  Melton Alar, PA-C Inpatient   04/13/2014 10:04 PM 04/14/2014  4:25 PM Full Code [id number]  Rise Patience, MD Inpatient   01/04/2014  5:21 PM 01/12/2014  4:38 PM Full Code [id number]  Hosie Poisson, MD Inpatient   01/02/2014  6:42 AM 01/04/2014  5:21 PM Full Code [id number]  Varney Biles, MD ED   02/10/2013  7:49 PM 02/12/2013  4:04 PM Full Code [id number]  Delfina Redwood, MD Inpatient   01/04/2013 10:59 PM 01/07/2013  2:39 PM Full Code [id number]  Berle Mull, MD Inpatient   02/26/2012  6:29 PM 02/27/2012  5:59 PM Full Code [id number]  Orvil Feil, RN Inpatient   09/29/2011 11:34 PM 10/08/2011  8:08 PM Full Code [id number]  Milta Deiters, RN Inpatient    Questions for Most Recent Historical Code Status (Order [id number])    Question Answer Comment   In the event of cardiac or respiratory ARREST Do not call a "code blue"    In the event of cardiac or respiratory ARREST Do not perform Intubation, CPR, defibrillation or ACLS    In the event of cardiac or respiratory ARREST Use medication by any route, position, wound care, and other measures to relive pain and suffering. May use oxygen, suction and manual treatment of airway obstruction as needed  for comfort.         Advance Directive Documentation   Flowsheet Row Most Recent Value  Type of Advance Directive  Out of facility DNR (pink MOST or yellow form)  Pre-existing out of facility DNR order (yellow form or pink MOST form)  No data  "MOST" Form in Place?  No data       Chief Complaint  Patient presents with  . Acute Visit    Diabetes management    HISTORY OF PRESENT ILLNESS:   This is a 75 year old female who has CBGs - high, 199, 574, 103, 574, 242. Latest hgbA1c 6.4. She was seen in her room with husband @ bedside. She is currently taking Levemir 10 units Q 12 noon and Novolog sliding scale TID.  She has been admitted to [facility] on 01/22/16 from [facility] with right-sided chest pain fost fall. She sustained multiple right rib fracture. Pain management and incentive spirometry was recommended.   PAST MEDICAL HISTORY:  Past Medical History:  Diagnosis Date  . Anemia   . Arthritis   . Asthma    Home 3L O2   . Asthma   . Chronic anemia   . Depression    patient feels depressed at the end of the month  . Diabetes  mellitus type 2, insulin dependent ([place])    initial diagnoses 11/2008  . Diabetes mellitus without complication ([place])   . Diabetic ketoacidosis ([place]) 11/2010  . Diverticulosis   . Esophageal stricture 06/2004   Dilation 06/2004  . Family history of anesthesia complication    DAUGHTER HAS NAUSEA  . GERD (gastroesophageal reflux disease)   . history of  pericarditis 12/2002  . History of blood transfusion   . Hypertension   . Myocardial infarction    Chest pain s/p normal cath in 08/2004 then NSTEMI during 11/2010 admission, negative Myoview  . Osteoarthritis   . Oxygen deficiency   . Pericardial effusion   . Rectal bleeding 11/2010   Large hemorrhoids  . Rheumatoid arteritis   . Rheumatoid arthritis(714.0)    On MTX and chronic steroids  . Seizure ([place]) 11/2008  . Supraventricular tachycardia ([facility])      CURRENT MEDICATIONS:  Reviewed  Patient's Medications  New Prescriptions   No medications on file  Previous Medications   ACETAMINOPHEN (TYLENOL) 500 MG TABLET    Take 1,000 mg by mouth 2 (two) times daily.   ALBUTEROL (PROVENTIL) (2.5 MG/3ML) 0.083% NEBULIZER SOLUTION    Take 3 mLs by nebulization every 6 (six) hours as needed for wheezing or shortness of breath.   ALBUTEROL (PROVENTIL) (5 MG/ML) 0.5% NEBULIZER SOLUTION    Take 2.5 mg by nebulization every 6 (six) hours as needed for wheezing or shortness of breath (shortness of breath).    AMBULATORY NON FORMULARY MEDICATION    Continuous O2 @ 2.5-3 LMP   BD PEN NEEDLE NANO U/F 32G X 4 MM MISC       CHOLECALCIFEROL (VITAMIN D PO)    Take 1 tablet by mouth daily.   COENZYME Q10 (CO Q 10) 100 MG CAPS    Take 100 mg by mouth daily.   DIPHENHYDRAMINE (BENADRYL) 25 MG TABLET    Take 25 mg by mouth daily as needed for allergies.    DOCUSATE SODIUM (COLACE) 100 MG CAPSULE    Take 100 mg by mouth 2 (two) times daily.    DONEPEZIL (ARICEPT) 10 MG TABLET    Take 1 tablet (10 mg total) by mouth at bedtime.   ESTROGENS, CONJUGATED, (PREMARIN) 0.625 MG TABLET    Take 0.625 mg by mouth daily.    FEFUM-FEPOLY-FA-B CMP-C-BIOT (INTEGRA PLUS) CAPS    Take 1 capsule by mouth 2 (two) times daily.   FLUTICASONE (FLONASE) 50 MCG/ACT NASAL SPRAY    Place 2 sprays into both nostrils daily as needed for allergies or rhinitis.   FREESTYLE LITE TEST STRIP    TEST 10 TIMES DAILY ICD E11.65   GLUCOSAMINE-FISH OIL-EPA-DHA (GLUCOSAMINE & FISH OIL PO)    Take 1,000 mg by mouth 2 (two) times daily.    INSULIN ASPART (NOVOLOG) 100 UNIT/ML INJECTION    0-9 Units, Subcutaneous, 3 times daily with meals CBG < 70: implement hypoglycemia protocol CBG 70 - 120: 0 units CBG 121 - 150: 1 unit CBG 151 - 200: 2 units CBG 201 - 250: 3 units CBG 251 - 300: 5 units CBG 301 - 350: 7 units CBG 351 - 400: 9 units CBG > 400: call MD   INSULIN DETEMIR (LEVEMIR) 100 UNIT/ML INJECTION    Inject 14 Units into  the skin. Give at [place] (HUMALOG) 100 UNIT/ML INJECTION    Inject 15 Units into the skin once. Give a one time dose 01/26/16   LEFLUNOMIDE (ARAVA) 20 MG  TABLET    Take 20 mg by mouth daily.    LEVALBUTEROL (XOPENEX HFA) 45 MCG/ACT INHALER    Inhale 2 puffs into the lungs every 4 (four) hours as needed for wheezing (wheezing).    LORAZEPAM (ATIVAN) 1 MG TABLET    Take 0.5 tablets (0.5 mg total) by mouth every 4 (four) hours as needed for anxiety.   MENTHOL (ICY HOT) 5 % PTCH    Apply 2 patches topically every 12 (twelve) hours. Apply 2 patches to affected areas q12h x5 days   METOPROLOL SUCCINATE (TOPROL-XL) 100 MG 24 HR TABLET    Take 100 mg by mouth daily. Take with or immediately following a meal.   MULTIPLE VITAMIN (MULTIVITAMINS PO)    Take 1 tablet by mouth. Daily.    POLYETHYL GLYCOL-PROPYL GLYCOL (SYSTANE OP)    Apply 1 drop to eye daily as needed (dry eyes).   PREDNISONE (DELTASONE) 5 MG TABLET    Take 10 mg by mouth daily. Take 10 mg po qd   PREGABALIN (LYRICA) 75 MG CAPSULE    Take 1 capsule (75 mg total) by mouth 3 (three) times daily.   PROMETHAZINE (PHENERGAN) 25 MG TABLET    Take 1 tablet by mouth every 8 (eight) hours as needed for vomiting.    RANITIDINE (ZANTAC) 150 MG TABLET    Take 150 mg by mouth 2 (two) times daily.   RISPERIDONE (RISPERDAL) 0.5 MG TABLET    Take 0.5 tablets (0.25 mg total) by mouth 2 (two) times daily.   TRAMADOL (ULTRAM) 50 MG TABLET    Take 1 tablet (50 mg total) by mouth 3 (three) times daily as needed for moderate pain.   VITAMIN C (ASCORBIC ACID) 500 MG TABLET    Take 500 mg by mouth 2 (two) times daily.   XIIDRA 5 % SOLN    Place 1 drop into both eyes 2 (two) times daily as needed (dry eyes).   Modified Medications   No medications on file  Discontinued Medications   INSULIN DETEMIR (LEVEMIR) 100 UNIT/ML INJECTION    Inject 0.1 mLs (10 Units total) into the skin daily at 12 noon.     Allergies  Allergen Reactions  . Demerol  [Meperidine] Other (See Comments)    Hallucinations  . Penicillins Swelling and Other (See Comments)    Throat swelling Has patient had a PCN reaction causing immediate rash, facial/tongue/throat swelling, SOB or lightheadedness with hypotension: Yes Has patient had a PCN reaction causing severe rash involving mucus membranes or skin necrosis: No Has patient had a PCN reaction that required hospitalization No Has patient had a PCN reaction occurring within the last 10 years: No If all of the above answers are "NO", then may proceed with Cephalosporin use.   . Enbrel [Etanercept] Swelling    Arm swelling   . Humira [Adalimumab] Swelling    Arm swelling      REVIEW OF SYSTEMS:  GENERAL: no change in appetite, no fatigue, no weight changes, no fever, chills or weakness EYES: Denies change in vision, dry eyes, eye pain, itching or discharge EARS: Denies change in hearing, ringing in ears, or earache NOSE: Denies nasal congestion or epistaxis MOUTH and THROAT: Denies oral discomfort, gingival pain or bleeding, pain from teeth or hoarseness   RESPIRATORY: no cough, SOB, DOE, wheezing, hemoptysis CARDIAC: no chest pain, edema or palpitations GI: no abdominal pain, diarrhea, constipation, heart burn, nausea or vomiting GU: Denies dysuria, frequency, hematuria, incontinence, or discharge PSYCHIATRIC: Denies feeling  of depression or anxiety. No report of hallucinations, insomnia, paranoia, or agitation    PHYSICAL EXAMINATION  GENERAL APPEARANCE: Well nourished. In no acute distress. Normal body habitus SKIN:  Skin is warm and dry.  HEAD: Normal in size and contour. No evidence of trauma EYES: Lids open and close normally. No blepharitis, entropion or ectropion. PERRL. Conjunctivae are clear and sclerae are white. Lenses are without opacity EARS: Pinnae are normal. Patient hears normal voice tunes of the examiner MOUTH and THROAT: Lips are without lesions. Oral mucosa is moist and  without lesions. Tongue is normal in shape, size, and color and without lesions NECK: supple, trachea midline, no neck masses, no thyroid tenderness, no thyromegaly LYMPHATICS: no LAN in the neck, no supraclavicular LAN RESPIRATORY: breathing is even & unlabored, BS CTAB CARDIAC: RRR, no murmur,no extra heart sounds, no edema GI: abdomen soft, normal BS, no masses, no tenderness, no hepatomegaly, no splenomegaly EXTREMITIES:  Able to move X 4 extremities PSYCHIATRIC: Alert and oriented X 3. Affect and behavior are appropriate  LABS/RADIOLOGY: Labs reviewed: Basic Metabolic Panel:  Recent Labs  04/07/15 1223  01/20/16 0417 01/20/16 1525 01/21/16 0358  NA 133*  < > 130* 129* 131*  K 5.0  < > 4.9 5.8* 5.0  CL 94*  < > 95* 96* 96*  CO2 28  < > 27 29 29   GLUCOSE 197*  < > 315* 263* 198*  BUN 20  < > 23* 23* 24*  CREATININE 1.33*  < > 1.43* 1.52* 1.56*  CALCIUM 8.5*  < > 8.1* 8.4* 8.1*  MG 2.3  --   --   --   --   < > = values in this interval not displayed. Liver Function Tests:  Recent Labs  04/07/15 1223 04/08/15 0515 01/18/16 1111  AST 21 16 19   ALT 17 17 14   ALKPHOS 263* 243* 75  BILITOT 0.4 1.3* 1.5*  PROT 6.3* 6.5 5.7*  ALBUMIN 3.2* 3.1* 3.2*   CBC:  Recent Labs  10/19/15 1510 11/23/15 1521 12/28/15 1513 01/18/16 1111 01/19/16 0810 01/20/16 0417  WBC 9.3 10.0 9.8 12.1* 10.3 9.4  NEUTROABS 8.6* 9.4* 9.0*  --   --   --   HGB 9.2* 9.0* 9.2* 8.4* 7.7* 8.4*  HCT 27.4* 27.6* 28.2* 24.7* 23.2* 25.8*  MCV 87.6 87.1 85.8 85.5 86.9 86.9  PLT 239 218 253 279 277 259   Cardiac Enzymes:  Recent Labs  04/07/15 2306 04/08/15 0515 04/08/15 1126  TROPONINI 0.05* 0.10* 0.06*   CBG:  Recent Labs  01/22/16 0741 01/22/16 1125 01/22/16 1216  GLUCAP 213* 124* 130*     Dg Ribs Unilateral W/chest Right  Result Date: 01/18/2016 CLINICAL DATA:  Fall. EXAM: RIGHT RIBS AND CHEST - 3+ VIEW COMPARISON:  04/07/2015. FINDINGS: Mediastinum hilar structures are normal.  Lungs are clear of infiltrates. Prominent skin fold noted over the left chest. No pleural effusion or pneumothorax. Multiple right posterior, posterior-lateral common anterior lateral rib fractures are present. Stable right deformities both shoulders. IMPRESSION: Multiple posterior, posterior-lateral common anterior lateral right rib fractures are noted. No pneumothorax. Electronically Signed   By: Marcello Moores  Register   On: 01/18/2016 09:15   Dg Ankle Complete Left  Result Date: 01/09/2016 CLINICAL DATA:  Acute onset of left ankle bruising and pain. Initial encounter. EXAM: LEFT ANKLE COMPLETE - 3+ VIEW COMPARISON:  None. FINDINGS: There is no evidence of fracture or dislocation. The ankle mortise is intact; the interosseous space is within normal limits. No talar  tilt or subluxation is seen. A plantar calcaneal spur is noted. The joint spaces are preserved. No significant soft tissue abnormalities are seen. IMPRESSION: No evidence of fracture or dislocation. Electronically Signed   By: Garald Balding M.D.   On: 01/09/2016 00:41   Dg Ankle Complete Right  Result Date: 01/09/2016 CLINICAL DATA:  Status post fall, with lateral right ankle swelling. Initial encounter. EXAM: RIGHT ANKLE - COMPLETE 3+ VIEW COMPARISON:  None. FINDINGS: There is no evidence of fracture or dislocation. There is mild chronic deformity of the distal fibula. There is chronic partial collapse of the subtalar joint, with mild fragmentation at the anterior talus. The ankle mortise is intact; the interosseous space is within normal limits. No talar tilt or subluxation is seen. A small os peroneum is noted. Mild lateral soft tissue swelling is noted. IMPRESSION: 1. No evidence of fracture or dislocation. 2. 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 Electronically Signed   By: Garald Balding M.D.   On: 01/09/2016 00:43   Dg Chest Port 1 View  Result Date: 01/19/2016 CLINICAL DATA:  Status  post fall 3 days ago with multiple right rib fractures. Shortness of breath. History of coronary artery disease and previous MI, asthma-COPD, rheumatoid arthritis. EXAM: PORTABLE CHEST 1 VIEW COMPARISON:  Chest x-ray of January 18, 2016 FINDINGS: The lungs are well-expanded. There is no pneumothorax. Fractures of the posterior lateral aspects of the right seventh, eighth, and ninth ribs are observed. There is no pleural effusion. The heart is top-normal in size. The pulmonary vascularity is normal. There is calcification in the wall of the aortic arch. 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 There is multilevel degenerative disc disease of the thoracic spine with gentle dextrocurvature centered in the mid thoracic spine. IMPRESSION: Hyperinflation consistent with COPD. No pneumothorax, pleural effusion, or pneumonia. No CHF.  Aortic atherosclerosis. Fractures of the seventh through ninth right lateral ribs. 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 Multilevel degenerative disc disease of the thoracic spine. Electronically Signed   By: David  Martinique M.D.   On: 01/19/2016 07:40   Dg Knee Complete 4 Views Left  Result Date: 01/09/2016 CLINICAL DATA:  Status post fall, with left knee pain. Initial encounter. EXAM: LEFT KNEE - COMPLETE 4+ VIEW COMPARISON:  None. FINDINGS: A thin osseous fragment is noted at the lateral aspect of the patella. This may reflect remote injury. The joint spaces are preserved. Sclerosis is noted at the femoral condyles bilaterally, with mild cortical irregularity along the lateral compartment. A fabella is noted. No significant joint effusion is seen. The visualized soft tissues are normal in appearance. IMPRESSION: 1. Thin osseous fragment at the lateral aspect of the patella. This may reflect remote injury. No definite evidence for acute fracture. 2. Degenerative change at the femoral condyles bilaterally, with mild cortical irregularity along the lateral  compartment. Electronically Signed   By: Garald Balding M.D.   On: 01/09/2016 00:36   Dg Knee Complete 4 Views Right  Result Date: 01/09/2016 CLINICAL DATA:  Status post fall, with right knee pain. Initial encounter. EXAM: RIGHT KNEE - COMPLETE 4+ VIEW COMPARISON:  None. FINDINGS: There is no evidence of fracture or dislocation. There is mild narrowing of the lateral compartment, with cortical irregularity at the lateral femoral condyle and lateral tibial plateau. Marginal osteophytes are seen arising at the medial compartment. A fabella is seen. A small knee joint effusion is noted. The visualized soft tissues are otherwise  unremarkable in appearance. IMPRESSION: 1. No evidence of fracture or dislocation. 2. Small knee joint effusion noted. 3. Mild narrowing of the lateral compartment, with associated cortical irregularity. Electronically Signed   By: Garald Balding M.D.   On: 01/09/2016 00:38   Dg Hips Bilat W Or Wo Pelvis 3-4 Views  Result Date: 01/09/2016 CLINICAL DATA:  Status post fall, with left hip pain. Initial encounter. EXAM: DG HIP (WITH OR WITHOUT PELVIS) 3-4V BILAT COMPARISON:  None. FINDINGS: There is no evidence of fracture or dislocation. Both femoral heads are seated normally within their respective acetabula. The proximal femurs appear intact bilaterally. Degenerative change is noted along the lower lumbar spine. The sacroiliac joints are unremarkable in appearance. There is chronic deformity of the superior and inferior pubic rami bilaterally. The visualized bowel gas pattern is grossly unremarkable in appearance. Scattered phleboliths are noted within the pelvis. IMPRESSION: 1. No evidence of fracture or dislocation. 2. Chronic deformity of the superior and inferior pubic rami bilaterally. Electronically Signed   By: Garald Balding M.D.   On: 01/09/2016 00:45    ASSESSMENT/PLAN:  Diabetes Mellitus, type 2 -  Discontinue Levemir 10 units; start Levemir 100 units/ml give 14 units SQ Q  12 noon; add CBG Q HS without insulin coverage; give Humalog 15 units SQ Now due to high CBG Lab Results  Component Value Date   HGBA1C 6.4 (H) 01/18/2016       Durenda Age, NP [employer] [phone]

## 2016-01-29 DIAGNOSIS — R079 Chest pain, unspecified: Secondary | ICD-10-CM | POA: Diagnosis not present

## 2016-01-29 DIAGNOSIS — S20211A Contusion of right front wall of thorax, initial encounter: Secondary | ICD-10-CM | POA: Diagnosis not present

## 2016-01-29 DIAGNOSIS — R2681 Unsteadiness on feet: Secondary | ICD-10-CM | POA: Diagnosis not present

## 2016-01-29 DIAGNOSIS — Z5189 Encounter for other specified aftercare: Secondary | ICD-10-CM | POA: Diagnosis not present

## 2016-02-01 ENCOUNTER — Other Ambulatory Visit: Payer: Self-pay

## 2016-02-01 DIAGNOSIS — R2681 Unsteadiness on feet: Secondary | ICD-10-CM | POA: Diagnosis not present

## 2016-02-01 DIAGNOSIS — S20211A Contusion of right front wall of thorax, initial encounter: Secondary | ICD-10-CM | POA: Diagnosis not present

## 2016-02-01 DIAGNOSIS — Z5189 Encounter for other specified aftercare: Secondary | ICD-10-CM | POA: Diagnosis not present

## 2016-02-01 DIAGNOSIS — R079 Chest pain, unspecified: Secondary | ICD-10-CM | POA: Diagnosis not present

## 2016-02-02 LAB — CBC AND DIFFERENTIAL
HEMATOCRIT: 27 % — AB (ref 36–46)
HEMOGLOBIN: 8.7 g/dL — AB (ref 12.0–16.0)
NEUTROS ABS: 4 /uL
PLATELETS: 353 10*3/uL (ref 150–399)
WBC: 6.2 10*3/mL

## 2016-02-02 LAB — BASIC METABOLIC PANEL
BUN: 24 mg/dL — AB (ref 4–21)
Creatinine: 1.1 mg/dL (ref 0.5–1.1)
Glucose: 127 mg/dL
Potassium: 3.9 mmol/L (ref 3.4–5.3)
Sodium: 139 mmol/L (ref 137–147)

## 2016-02-02 LAB — HEMOGLOBIN A1C: HEMOGLOBIN A1C: 6.8

## 2016-02-05 DIAGNOSIS — R2681 Unsteadiness on feet: Secondary | ICD-10-CM | POA: Diagnosis not present

## 2016-02-05 DIAGNOSIS — S20211A Contusion of right front wall of thorax, initial encounter: Secondary | ICD-10-CM | POA: Diagnosis not present

## 2016-02-05 DIAGNOSIS — R079 Chest pain, unspecified: Secondary | ICD-10-CM | POA: Diagnosis not present

## 2016-02-05 DIAGNOSIS — Z5189 Encounter for other specified aftercare: Secondary | ICD-10-CM | POA: Diagnosis not present

## 2016-02-07 DIAGNOSIS — Z5189 Encounter for other specified aftercare: Secondary | ICD-10-CM | POA: Diagnosis not present

## 2016-02-07 DIAGNOSIS — R2681 Unsteadiness on feet: Secondary | ICD-10-CM | POA: Diagnosis not present

## 2016-02-07 DIAGNOSIS — S20211A Contusion of right front wall of thorax, initial encounter: Secondary | ICD-10-CM | POA: Diagnosis not present

## 2016-02-07 DIAGNOSIS — R079 Chest pain, unspecified: Secondary | ICD-10-CM | POA: Diagnosis not present

## 2016-02-09 ENCOUNTER — Encounter: Payer: Self-pay | Admitting: Internal Medicine

## 2016-02-09 ENCOUNTER — Non-Acute Institutional Stay (SKILLED_NURSING_FACILITY): Payer: Medicare Other | Admitting: Internal Medicine

## 2016-02-09 DIAGNOSIS — F411 Generalized anxiety disorder: Secondary | ICD-10-CM | POA: Diagnosis not present

## 2016-02-09 DIAGNOSIS — R109 Unspecified abdominal pain: Secondary | ICD-10-CM | POA: Diagnosis not present

## 2016-02-09 DIAGNOSIS — R195 Other fecal abnormalities: Secondary | ICD-10-CM

## 2016-02-09 DIAGNOSIS — K648 Other hemorrhoids: Secondary | ICD-10-CM | POA: Diagnosis not present

## 2016-02-09 NOTE — Progress Notes (Signed)
LOCATION: [facility]  PCP: Jani Gravel, MD   Code Status: DNR  Goals of care: Advanced Directive information Advanced Directives 01/26/2016  Does patient have an advance directive? Yes  Type of Advance Directive Out of facility DNR (pink MOST or yellow form)  Does patient want to make changes to advanced directive? No - Patient declined  Copy of advanced directive(s) in chart? Yes  Would patient like information on creating an advanced directive? -  Pre-existing out of facility DNR order (yellow form or pink MOST form) -       Extended Emergency Contact Information Primary Emergency Contact: [facility] Address: [address]          [place], [place] [address] Johnnette Litter of [place] Phone: [phone] Mobile Phone: [phone] Relation: Spouse Secondary Emergency Contact: Ramsay,Suzanne  [place] Mobile Phone: [phone] Relation: Daughter   Allergies  Allergen Reactions  . Demerol [Meperidine] Other (See Comments)    Hallucinations  . Penicillins Swelling and Other (See Comments)    Throat swelling Has patient had a PCN reaction causing immediate rash, facial/tongue/throat swelling, SOB or lightheadedness with hypotension: Yes Has patient had a PCN reaction causing severe rash involving mucus membranes or skin necrosis: No Has patient had a PCN reaction that required hospitalization No Has patient had a PCN reaction occurring within the last 10 years: No If all of the above answers are "NO", then may proceed with Cephalosporin use.   . Enbrel [Etanercept] Swelling    Arm swelling   . Humira [Adalimumab] Swelling    Arm swelling     Chief Complaint  Patient presents with  . Acute Visit    Abdominal pain     HPI:  Patient is a 75 y.o. female seen today for acute visit. She complaints of intermittent cramping to her abdomen. She has been having bowel movement with some blood streaks and mentions having hx of hemorrhoids.  She has loose stool. Denies nausea or vomiting. Denies fever. She is here for short term rehabilitation post hospital admission from 01/18/16-01/22/16 with right sided chest pain post fall and multiple right rib fracture. She is seen in her room with husband at bedside. Husband would like her home regimen daily vitamin k supplement started.  Review of Systems: limited participation with her dementia Constitutional: Negative for fever. Feels weak and tired. HENT: Negative for headache. Respiratory: Negative for cough. Positive for right sided chest wall pain with deep breathing    Cardiovascular: Negative for chest pain  Gastrointestinal: Negative for vomiting, nausea. Had bowel movement this am Genitourinary: Negative for dysuria    Past Medical History:  Diagnosis Date  . Anemia   . Arthritis   . Asthma    Home 3L O2   . Asthma   . Chronic anemia   . Depression    patient feels depressed at the end of the month  . Diabetes mellitus type 2, insulin dependent ([place])    initial diagnoses 11/2008  . Diabetes mellitus without complication ([place])   . Diabetic ketoacidosis ([place]) 11/2010  . Diverticulosis   . Esophageal stricture 06/2004   Dilation 06/2004  . Family history of anesthesia complication    DAUGHTER HAS NAUSEA  . GERD (gastroesophageal reflux disease)   . history of  pericarditis 12/2002  . History of blood transfusion   . Hypertension   . Myocardial infarction    Chest pain s/p normal cath in 08/2004 then NSTEMI during 11/2010 admission, negative Myoview  .  Osteoarthritis   . Oxygen deficiency   . Pericardial effusion   . Rectal bleeding 11/2010   Large hemorrhoids  . Rheumatoid arteritis   . Rheumatoid arthritis(714.0)    On MTX and chronic steroids  . Seizure ([place]) 11/2008  . Supraventricular tachycardia [facility])    Past Surgical History:  Procedure Laterality Date  . ABDOMINAL HYSTERECTOMY  1980  . CHOLECYSTECTOMY    . COLONOSCOPY N/A 01/06/2013   Procedure: COLONOSCOPY;   Surgeon: Ladene Artist, MD;  Location: [facility] ENDOSCOPY;  Service: Endoscopy;  Laterality: N/A;  . ESOPHAGOGASTRODUODENOSCOPY N/A 02/11/2013   Procedure: ESOPHAGOGASTRODUODENOSCOPY (EGD);  Surgeon: Irene Shipper, MD;  Location: [facility] ENDOSCOPY;  Service: Endoscopy;  Laterality: N/A;  . INCISE AND DRAIN ABCESS  09/2011   I&D of peri-rectal abcess per Dr Zella Richer.     Medications:   Medication List       Accurate as of 02/09/16 12:47 PM. Always use your most recent med list.          AMBULATORY NON FORMULARY MEDICATION Continuous O2 @ 2.5-3 LMP   BD PEN NEEDLE NANO U/F 32G X 4 MM Misc Generic drug:  Insulin Pen Needle   BIOFREEZE 4 % Gel Generic drug:  Menthol (Topical Analgesic) Apply 1 application topically daily.   Co Q 10 100 MG Caps Take 100 mg by mouth daily.   diphenhydrAMINE 25 MG tablet Commonly known as:  BENADRYL Take 25 mg by mouth every 8 (eight) hours as needed for allergies.   docusate sodium 100 MG capsule Commonly known as:  COLACE Take 100 mg by mouth 2 (two) times daily.   donepezil 10 MG tablet Commonly known as:  ARICEPT Take 1 tablet (10 mg total) by mouth at bedtime.   estrogens (conjugated) 0.625 MG tablet Commonly known as:  PREMARIN Take 0.625 mg by mouth daily.   fluticasone 50 MCG/ACT nasal spray Commonly known as:  FLONASE Place 2 sprays into both nostrils daily as needed for allergies or rhinitis.   FREESTYLE LITE test strip Generic drug:  glucose blood TEST 10 TIMES DAILY ICD E11.65   GLUCOSAMINE & FISH OIL PO Take 1,000 mg by mouth 2 (two) times daily.   insulin aspart 100 UNIT/ML injection Commonly known as:  novoLOG Inject 5 Units into the skin 3 (three) times daily before meals.   insulin aspart 100 UNIT/ML injection Commonly known as:  novoLOG 0-9 Units, Subcutaneous, 3 times daily with meals CBG < 70: implement hypoglycemia protocol CBG 70 - 120: 0 units CBG 121 - 150: 1 unit CBG 151 - 200: 2 units CBG 201 - 250: 3 units  CBG 251 - 300: 5 units CBG 301 - 350: 7 units CBG 351 - 400: 9 units CBG > 400: call MD   ipratropium-albuterol 0.5-2.5 (3) MG/3ML Soln Commonly known as:  DUONEB Take 3 mLs by nebulization every 6 (six) hours as needed.   leflunomide 20 MG tablet Commonly known as:  ARAVA Take 20 mg by mouth daily.   levalbuterol 45 MCG/ACT inhaler Commonly known as:  XOPENEX HFA Inhale 2 puffs into the lungs every 4 (four) hours as needed for wheezing (wheezing).   LEVEMIR 100 UNIT/ML injection Generic drug:  insulin detemir Inject 12 Units into the skin. Give at 2PM   LORazepam 1 MG tablet Commonly known as:  ATIVAN Take 0.5 tablets (0.5 mg total) by mouth every 4 (four) hours as needed for anxiety.   metoprolol succinate 100 MG 24 hr tablet Commonly known as:  TOPROL-XL Take 100 mg by mouth daily. Take with or immediately following a meal.   MULTIVITAMINS PO Take 1 tablet by mouth. Daily.   predniSONE 5 MG tablet Commonly known as:  DELTASONE Take 10 mg by mouth daily. Take 10 mg po qd   pregabalin 75 MG capsule Commonly known as:  LYRICA Take 1 capsule (75 mg total) by mouth 3 (three) times daily.   promethazine 25 MG tablet Commonly known as:  PHENERGAN Take 1 tablet by mouth every 8 (eight) hours as needed for vomiting.   ranitidine 150 MG tablet Commonly known as:  ZANTAC Take 150 mg by mouth 2 (two) times daily.   risperiDONE 0.5 MG tablet Commonly known as:  RISPERDAL Take 0.5 tablets (0.25 mg total) by mouth 2 (two) times daily.   SYSTANE OP Apply 1 drop to eye daily as needed (dry eyes).   traMADol 50 MG tablet Commonly known as:  ULTRAM Take 1 tablet (50 mg total) by mouth 3 (three) times daily as needed for moderate pain.   vitamin C 500 MG tablet Commonly known as:  ASCORBIC ACID Take 500 mg by mouth 2 (two) times daily.   VITAMIN D PO Take 1 tablet by mouth daily.   XIIDRA 5 % Soln Generic drug:  Lifitegrast Place 1 drop into both eyes 2 (two) times  daily as needed (dry eyes).       Immunizations: Immunization History  Administered Date(s) Administered  . Influenza Split 02/27/2012, 02/03/2013  . Influenza,inj,Quad PF,36+ Mos 01/05/2014     Physical Exam:  Vitals:   02/09/16 1236  BP: 139/78  Pulse: 78  Resp: 18  Temp: 97.6 F (36.4 C)  TempSrc: Oral  SpO2: 97%  Weight: 108 lb 12.8 oz (49.4 kg)  Height: 4\' 7"  (1.397 m)   Body mass index is 25.29 kg/m.  General- elderly female, in no acute distress, ill appearing Head- normocephalic, atraumatic Throat- moist mucus membrane Eyes- no pallor, no icterus, no discharge Neck- no cervical lymphadenopathy Cardiovascular- normal s1,s2, + murmur, trace leg edema Respiratory- bilateral poor air entry, + wheeze, no rhonchi, no crackles, on o2 by nasal canula Abdomen- bowel sounds present, soft, non tender, no guarding or rigidity Musculoskeletal- able to move all 4 extremities, generalized weakness Neurological- alert and oriented to self and place Skin- warm and dry Psychiatry- appears anxious    Labs reviewed: Basic Metabolic Panel:  Recent Labs  04/07/15 1223  01/20/16 0417 01/20/16 1525 01/21/16 0358 02/02/16  NA 133*  < > 130* 129* 131* 139  K 5.0  < > 4.9 5.8* 5.0 3.9  CL 94*  < > 95* 96* 96*  --   CO2 28  < > 27 29 29   --   GLUCOSE 197*  < > 315* 263* 198*  --   BUN 20  < > 23* 23* 24* 24*  CREATININE 1.33*  < > 1.43* 1.52* 1.56* 1.1  CALCIUM 8.5*  < > 8.1* 8.4* 8.1*  --   MG 2.3  --   --   --   --   --   < > = values in this interval not displayed. Liver Function Tests:  Recent Labs  04/07/15 1223 04/08/15 0515 01/18/16 1111  AST 21 16 19   ALT 17 17 14   ALKPHOS 263* 243* 75  BILITOT 0.4 1.3* 1.5*  PROT 6.3* 6.5 5.7*  ALBUMIN 3.2* 3.1* 3.2*   No results for input(s): LIPASE, AMYLASE in the last 8760 hours. No results for  input(s): AMMONIA in the last 8760 hours. CBC:  Recent Labs  11/23/15 1521 12/28/15 1513 01/18/16 1111  01/19/16 0810 01/20/16 0417 02/02/16  WBC 10.0 9.8 12.1* 10.3 9.4 6.2  NEUTROABS 9.4* 9.0*  --   --   --  4  HGB 9.0* 9.2* 8.4* 7.7* 8.4* 8.7*  HCT 27.6* 28.2* 24.7* 23.2* 25.8* 27*  MCV 87.1 85.8 85.5 86.9 86.9  --   PLT 218 253 279 277 259 353   Cardiac Enzymes:  Recent Labs  04/07/15 2306 04/08/15 0515 04/08/15 1126  TROPONINI 0.05* 0.10* 0.06*   BNP: Invalid input(s): POCBNP CBG:  Recent Labs  01/22/16 0741 01/22/16 1125 01/22/16 1216  GLUCAP 213* 124* 130*    Radiological Exams: Dg Ribs Unilateral W/chest Right  Result Date: 01/18/2016 CLINICAL DATA:  Fall. EXAM: RIGHT RIBS AND CHEST - 3+ VIEW COMPARISON:  04/07/2015. FINDINGS: Mediastinum hilar structures are normal. Lungs are clear of infiltrates. Prominent skin fold noted over the left chest. No pleural effusion or pneumothorax. Multiple right posterior, posterior-lateral common anterior lateral rib fractures are present. Stable right deformities both shoulders. IMPRESSION: Multiple posterior, posterior-lateral common anterior lateral right rib fractures are noted. No pneumothorax. Electronically Signed   By: Marcello Moores  Register   On: 01/18/2016 09:15   Dg Chest Port 1 View  Result Date: 01/19/2016 CLINICAL DATA:  Status post fall 3 days ago with multiple right rib fractures. Shortness of breath. History of coronary artery disease and previous MI, asthma-COPD, rheumatoid arthritis. EXAM: PORTABLE CHEST 1 VIEW COMPARISON:  Chest x-ray of January 18, 2016 FINDINGS: The lungs are well-expanded. There is no pneumothorax. Fractures of the posterior lateral aspects of the right seventh, eighth, and ninth ribs are observed. There is no pleural effusion. The heart is top-normal in size. The pulmonary vascularity is normal. There is calcification in the wall of the aortic arch. 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 There is multilevel degenerative disc disease of the thoracic spine with gentle dextrocurvature centered in  the mid thoracic spine. IMPRESSION: Hyperinflation consistent with COPD. No pneumothorax, pleural effusion, or pneumonia. No CHF.  Aortic atherosclerosis. Fractures of the seventh through ninth right lateral ribs. 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 Multilevel degenerative disc disease of the thoracic spine. Electronically Signed   By: David  Martinique M.D.   On: 01/19/2016 07:40    Assessment/Plan  Abdominal cramp Pt has anxiety issues. With her altered bowel movement of diarrhea and constipation, concern for IBS present. Patient mentions being on medication for irritable bowel before. Start bentyl 10 mg bid x 1 week and monitor. No signs of acute abdomen on exam. Lab reviewed. Check cmp.  Internal hemorrhoids Add prep H suppository daily for now and monitor  Loose stool Currently on colace 100 mg bid. This could be iatrogenic along with symptom of her IBS with anxiety. No signs of infection like fever. Change this to 100 mg daily with loose stool and monitor.  If has increased loose stool, will evaluate for infectious etiology  GAD Get psychiatry consult to assess further. Continue her prn lorazepam   Restart her vitamin k2 100 mcg daily supplement per family request   Family/ staff Communication: reviewed care plan with patient, her husband and nursing supervisor    Blanchie Serve, MD Internal Medicine [address], [place] [address] Cell Phone (Monday-Friday 8 am - 5 pm): [phone] On Call: [phone] and follow prompts after 5 pm and on weekends  Office Phone: [phone] Office Fax: [phone]

## 2016-02-12 DIAGNOSIS — R079 Chest pain, unspecified: Secondary | ICD-10-CM | POA: Diagnosis not present

## 2016-02-12 DIAGNOSIS — Z5189 Encounter for other specified aftercare: Secondary | ICD-10-CM | POA: Diagnosis not present

## 2016-02-12 DIAGNOSIS — R2681 Unsteadiness on feet: Secondary | ICD-10-CM | POA: Diagnosis not present

## 2016-02-12 DIAGNOSIS — S20211A Contusion of right front wall of thorax, initial encounter: Secondary | ICD-10-CM | POA: Diagnosis not present

## 2016-02-14 ENCOUNTER — Non-Acute Institutional Stay (SKILLED_NURSING_FACILITY): Payer: Medicare Other | Admitting: Adult Health

## 2016-02-14 ENCOUNTER — Encounter: Payer: Self-pay | Admitting: Adult Health

## 2016-02-14 DIAGNOSIS — M069 Rheumatoid arthritis, unspecified: Secondary | ICD-10-CM

## 2016-02-14 DIAGNOSIS — E1140 Type 2 diabetes mellitus with diabetic neuropathy, unspecified: Secondary | ICD-10-CM

## 2016-02-14 DIAGNOSIS — K219 Gastro-esophageal reflux disease without esophagitis: Secondary | ICD-10-CM

## 2016-02-14 DIAGNOSIS — S2241XS Multiple fractures of ribs, right side, sequela: Secondary | ICD-10-CM

## 2016-02-14 DIAGNOSIS — J449 Chronic obstructive pulmonary disease, unspecified: Secondary | ICD-10-CM

## 2016-02-14 DIAGNOSIS — H04123 Dry eye syndrome of bilateral lacrimal glands: Secondary | ICD-10-CM

## 2016-02-14 DIAGNOSIS — D638 Anemia in other chronic diseases classified elsewhere: Secondary | ICD-10-CM

## 2016-02-14 DIAGNOSIS — I10 Essential (primary) hypertension: Secondary | ICD-10-CM | POA: Diagnosis not present

## 2016-02-14 DIAGNOSIS — J309 Allergic rhinitis, unspecified: Secondary | ICD-10-CM | POA: Diagnosis not present

## 2016-02-14 DIAGNOSIS — N183 Chronic kidney disease, stage 3 unspecified: Secondary | ICD-10-CM

## 2016-02-14 DIAGNOSIS — F411 Generalized anxiety disorder: Secondary | ICD-10-CM

## 2016-02-14 DIAGNOSIS — K5900 Constipation, unspecified: Secondary | ICD-10-CM

## 2016-02-14 DIAGNOSIS — M792 Neuralgia and neuritis, unspecified: Secondary | ICD-10-CM

## 2016-02-14 DIAGNOSIS — F329 Major depressive disorder, single episode, unspecified: Secondary | ICD-10-CM | POA: Diagnosis not present

## 2016-02-14 DIAGNOSIS — R5381 Other malaise: Secondary | ICD-10-CM | POA: Diagnosis not present

## 2016-02-14 DIAGNOSIS — Z794 Long term (current) use of insulin: Secondary | ICD-10-CM

## 2016-02-14 DIAGNOSIS — F0391 Unspecified dementia with behavioral disturbance: Secondary | ICD-10-CM

## 2016-02-14 NOTE — Progress Notes (Signed)
Patient ID: Marisa [name], female   DOB: Jul 25, 1940, 75 y.o.   MRN: [id number]    DATE:    02/14/16  MRN:  [id number]  BIRTHDAY: [birth year]  Facility:  [facility] Location:  [place] Room Number: 1206-P  LEVEL OF CARE:  SNF (31)  Contact Information    Name Barnum Spouse [phone]  [phone]   Ramsay,Suzanne Daughter   [phone]       Code Status History    Date Active Date Inactive Code Status Order ID Comments User Context   01/18/2016  2:59 PM 01/22/2016  7:48 PM DNR [id number]  Oswald Hillock, MD ED   04/07/2015  4:07 PM 04/08/2015  5:27 PM Full Code [id number]  Donne Hazel, MD Inpatient   05/28/2014  8:22 AM 05/30/2014  8:05 PM Full Code [id number]  Melton Alar, PA-C Inpatient   04/13/2014 10:04 PM 04/14/2014  4:25 PM Full Code [id number]  Rise Patience, MD Inpatient   01/04/2014  5:21 PM 01/12/2014  4:38 PM Full Code [id number]  Hosie Poisson, MD Inpatient   01/02/2014  6:42 AM 01/04/2014  5:21 PM Full Code [id number]  Varney Biles, MD ED   02/10/2013  7:49 PM 02/12/2013  4:04 PM Full Code [id number]  Delfina Redwood, MD Inpatient   01/04/2013 10:59 PM 01/07/2013  2:39 PM Full Code [id number]  Berle Mull, MD Inpatient   02/26/2012  6:29 PM 02/27/2012  5:59 PM Full Code [id number]  Orvil Feil, RN Inpatient   09/29/2011 11:34 PM 10/08/2011  8:08 PM Full Code [id number]  Milta Deiters, RN Inpatient    Questions for Most Recent Historical Code Status (Order [id number])    Question Answer Comment   In the event of cardiac or respiratory ARREST Do not call a "code blue"    In the event of cardiac or respiratory ARREST Do not perform Intubation, CPR, defibrillation or ACLS    In the event of cardiac or respiratory ARREST Use medication by any route, position, wound care, and other measures to relive pain and suffering. May use oxygen, suction and manual treatment of airway obstruction as needed  for comfort.         Advance Directive Documentation   Flowsheet Row Most Recent Value  Type of Advance Directive  Out of facility DNR (pink MOST or yellow form)  Pre-existing out of facility DNR order (yellow form or pink MOST form)  No data  "MOST" Form in Place?  No data       Chief Complaint  Patient presents with  . Discharge Note    HISTORY OF PRESENT ILLNESS:   This is a 75 year old female who is for discharge home with Home health PT, OT and CNA. DME:  Rolling walker and bedside commode  .  She has been admitted to [facility] on 01/22/16 from [facility] with right-sided chest pain post fall. She sustained multiple right rib fracture. Pain management and incentive spirometry was recommended.  Patient was admitted to this facility for short-term rehabilitation after the patient's recent hospitalization.  Patient has completed SNF rehabilitation and therapy has cleared the patient for discharge.   PAST MEDICAL HISTORY:  Past Medical History:  Diagnosis Date  . Anemia   . Arthritis   . Asthma    Home 3L O2   . Chronic anemia   . Depression    patient feels depressed at the end of  the month  . Diabetes mellitus type 2, insulin dependent ([place])    initial diagnoses 11/2008  . Diabetes mellitus without complication ([place])   . Diabetic ketoacidosis ([place]) 11/2010  . Diverticulosis   . Esophageal stricture 06/2004   Dilation 06/2004  . Family history of anesthesia complication    DAUGHTER HAS NAUSEA  . GERD (gastroesophageal reflux disease)   . history of  pericarditis 12/2002  . History of blood transfusion   . Hypertension   . Myocardial infarction    Chest pain s/p normal cath in 08/2004 then NSTEMI during 11/2010 admission, negative Myoview  . Osteoarthritis   . Oxygen deficiency   . Pericardial effusion   . Rectal bleeding 11/2010   Large hemorrhoids  . Rheumatoid arteritis   . Rheumatoid arthritis(714.0)    On MTX and chronic steroids  . Seizure ([place])  11/2008  . Supraventricular tachycardia ([facility])      CURRENT MEDICATIONS: Reviewed  Patient's Medications  New Prescriptions   No medications on file  Previous Medications   AMBULATORY NON FORMULARY MEDICATION    Continuous O2 @ 2.5-3 LMP   BD PEN NEEDLE NANO U/F 32G X 4 MM MISC       CHOLECALCIFEROL (VITAMIN D PO)    Take 1 tablet by mouth daily. 1000 units   COENZYME Q10 (CO Q 10) 100 MG CAPS    Take 100 mg by mouth daily.   DICYCLOMINE (BENTYL) 10 MG/5ML SYRUP    Take 10 mg by mouth 2 (two) times daily.   DIPHENHYDRAMINE (BENADRYL) 25 MG TABLET    Take 25 mg by mouth every 8 (eight) hours as needed for allergies.    DOCUSATE SODIUM (COLACE) 100 MG CAPSULE    Take 100 mg by mouth.    DONEPEZIL (ARICEPT) 10 MG TABLET    Take 1 tablet (10 mg total) by mouth at bedtime.   ESTROGENS, CONJUGATED, (PREMARIN) 0.625 MG TABLET    Take 0.625 mg by mouth daily.    FE FUM-FEPOLY-VIT C-VIT B3 (INTEGRA PO)    Take 1 capsule by mouth 2 (two) times daily. Supplement   FLUTICASONE (FLONASE) 50 MCG/ACT NASAL SPRAY    Place 2 sprays into both nostrils daily as needed for allergies or rhinitis.   FREESTYLE LITE TEST STRIP    TEST 10 TIMES DAILY ICD E11.65   GLUCOSAMINE-FISH OIL-EPA-DHA (GLUCOSAMINE & FISH OIL PO)    Take 1,000 mg by mouth 2 (two) times daily.    INSULIN ASPART (NOVOLOG) 100 UNIT/ML INJECTION    0-9 Units, Subcutaneous, 3 times daily with meals CBG < 70: implement hypoglycemia protocol CBG 70 - 120: 0 units CBG 121 - 150: 1 unit CBG 151 - 200: 2 units CBG 201 - 250: 3 units CBG 251 - 300: 5 units CBG 301 - 350: 7 units CBG 351 - 400: 9 units CBG > 400: call MD   INSULIN DETEMIR (LEVEMIR) 100 UNIT/ML INJECTION    Inject 12 Units into the skin. Give at [place] (HUMALOG) 100 UNIT/ML INJECTION    Inject 5 Units into the skin at bedtime. For CBG >/= to 200   INSULIN LISPRO (HUMALOG) 100 UNIT/ML INJECTION    Inject 5 Units into the skin 3 (three) times daily before meals. For  CBG >/= 200 (in addition to sliding scale orders)   IPRATROPIUM-ALBUTEROL (DUONEB) 0.5-2.5 (3) MG/3ML SOLN    Take 3 mLs by nebulization every 6 (six) hours as needed.   LEFLUNOMIDE (ARAVA)  20 MG TABLET    Take 20 mg by mouth daily.    LEVALBUTEROL (XOPENEX HFA) 45 MCG/ACT INHALER    Inhale 2 puffs into the lungs every 4 (four) hours as needed for wheezing (wheezing).    LORAZEPAM (ATIVAN) 1 MG TABLET    Take 0.5 tablets (0.5 mg total) by mouth every 4 (four) hours as needed for anxiety.   MENTHOL, TOPICAL ANALGESIC, (BIOFREEZE) 4 % GEL    Apply 1 application topically daily. To bilateral knees and mid back   METOPROLOL SUCCINATE (TOPROL-XL) 100 MG 24 HR TABLET    Take 100 mg by mouth daily. Take with or immediately following a meal.    MULTIPLE VITAMIN (MULTIVITAMINS PO)    Take 1 tablet by mouth. Daily.    POLYETHYL GLYCOL-PROPYL GLYCOL (SYSTANE OP)    Apply 1 drop to eye daily as needed (dry eyes).   PREDNISONE (DELTASONE) 5 MG TABLET    Take 10 mg by mouth daily. Take 10 mg po qd   PREGABALIN (LYRICA) 75 MG CAPSULE    Take 1 capsule (75 mg total) by mouth 3 (three) times daily.   PROMETHAZINE (PHENERGAN) 25 MG TABLET    Take 1 tablet by mouth every 8 (eight) hours as needed for vomiting.    RANITIDINE (ZANTAC) 150 MG TABLET    Take 150 mg by mouth 2 (two) times daily.    RISPERIDONE (RISPERDAL) 0.5 MG TABLET    Take 0.5 tablets (0.25 mg total) by mouth 2 (two) times daily.   SHARK LIVER OIL-COCOA BUTTER (PREPARATION H) 0.25-3-85.5 % SUPPOSITORY    Place 1 suppository rectally 2 (two) times daily. Take 1 suppository BID x1 week, ending 02/17/16, then decrease to once qd for hemorrhoids   TRAMADOL (ULTRAM) 50 MG TABLET    Take 1 tablet (50 mg total) by mouth 3 (three) times daily as needed for moderate pain.   VITAMIN C (ASCORBIC ACID) 500 MG TABLET    Take 500 mg by mouth 2 (two) times daily.    VITAMIN K, PHYTONADIONE, PO    Take 100 mcg by mouth daily. Supplement   XIIDRA 5 % SOLN    Place 1  drop into both eyes 2 (two) times daily as needed (dry eyes).   Modified Medications   No medications on file  Discontinued Medications   INSULIN ASPART (NOVOLOG) 100 UNIT/ML INJECTION    Inject 5 Units into the skin 3 (three) times daily before meals.     Allergies  Allergen Reactions  . Demerol [Meperidine] Other (See Comments)    Hallucinations  . Penicillins Swelling and Other (See Comments)    Throat swelling Has patient had a PCN reaction causing immediate rash, facial/tongue/throat swelling, SOB or lightheadedness with hypotension: Yes Has patient had a PCN reaction causing severe rash involving mucus membranes or skin necrosis: No Has patient had a PCN reaction that required hospitalization No Has patient had a PCN reaction occurring within the last 10 years: No If all of the above answers are "NO", then may proceed with Cephalosporin use.   . Enbrel [Etanercept] Swelling    Arm swelling   . Humira [Adalimumab] Swelling    Arm swelling      REVIEW OF SYSTEMS:  GENERAL: no change in appetite, no fatigue, no weight changes, no fever, chills or weakness EYES: Denies change in vision, dry eyes, eye pain, itching or discharge EARS: Denies change in hearing, ringing in ears, or earache NOSE: Denies nasal congestion or epistaxis MOUTH  and THROAT: Denies oral discomfort, gingival pain or bleeding, pain from teeth or hoarseness   RESPIRATORY: no cough, SOB, DOE, wheezing, hemoptysis CARDIAC: no chest pain, edema or palpitations GI: no abdominal pain, diarrhea, constipation, heart burn, nausea or vomiting GU: Denies dysuria, frequency, hematuria, incontinence, or discharge PSYCHIATRIC: Denies feeling of depression or anxiety. No report of hallucinations, insomnia, paranoia, or agitation    PHYSICAL EXAMINATION  GENERAL APPEARANCE: Well nourished. In no acute distress. Normal body habitus SKIN:  Skin is warm and dry.  HEAD: Normal in size and contour. No evidence of  trauma EYES: Lids open and close normally. No blepharitis, entropion or ectropion. PERRL. Conjunctivae are clear and sclerae are white. Lenses are without opacity EARS: Pinnae are normal. Patient hears normal voice tunes of the examiner MOUTH and THROAT: Lips are without lesions. Oral mucosa is moist and without lesions. Tongue is normal in shape, size, and color and without lesions NECK: supple, trachea midline, no neck masses, no thyroid tenderness, no thyromegaly LYMPHATICS: no LAN in the neck, no supraclavicular LAN RESPIRATORY: breathing is even & unlabored, BS CTAB CARDIAC: RRR, + murmur,no extra heart sounds, no edema GI: abdomen soft, normal BS, no masses, no tenderness, no hepatomegaly, no splenomegaly EXTREMITIES:  Able to move X 4 extremities PSYCHIATRIC: Alert and oriented X 3. Affect and behavior are appropriate  LABS/RADIOLOGY: Labs reviewed: Basic Metabolic Panel:  Recent Labs  04/07/15 1223  01/20/16 0417 01/20/16 1525 01/21/16 0358 02/02/16  NA 133*  < > 130* 129* 131* 139  K 5.0  < > 4.9 5.8* 5.0 3.9  CL 94*  < > 95* 96* 96*  --   CO2 28  < > 27 29 29   --   GLUCOSE 197*  < > 315* 263* 198*  --   BUN 20  < > 23* 23* 24* 24*  CREATININE 1.33*  < > 1.43* 1.52* 1.56* 1.1  CALCIUM 8.5*  < > 8.1* 8.4* 8.1*  --   MG 2.3  --   --   --   --   --   < > = values in this interval not displayed. Liver Function Tests:  Recent Labs  04/07/15 1223 04/08/15 0515 01/18/16 1111  AST 21 16 19   ALT 17 17 14   ALKPHOS 263* 243* 75  BILITOT 0.4 1.3* 1.5*  PROT 6.3* 6.5 5.7*  ALBUMIN 3.2* 3.1* 3.2*   CBC:  Recent Labs  11/23/15 1521 12/28/15 1513 01/18/16 1111 01/19/16 0810 01/20/16 0417 02/02/16  WBC 10.0 9.8 12.1* 10.3 9.4 6.2  NEUTROABS 9.4* 9.0*  --   --   --  4  HGB 9.0* 9.2* 8.4* 7.7* 8.4* 8.7*  HCT 27.6* 28.2* 24.7* 23.2* 25.8* 27*  MCV 87.1 85.8 85.5 86.9 86.9  --   PLT 218 253 279 277 259 353   Cardiac Enzymes:  Recent Labs  04/07/15 2306  04/08/15 0515 04/08/15 1126  TROPONINI 0.05* 0.10* 0.06*   CBG:  Recent Labs  01/22/16 0741 01/22/16 1125 01/22/16 1216  GLUCAP 213* 124* 130*     Dg Ribs Unilateral W/chest Right  Result Date: 01/18/2016 CLINICAL DATA:  Fall. EXAM: RIGHT RIBS AND CHEST - 3+ VIEW COMPARISON:  04/07/2015. FINDINGS: Mediastinum hilar structures are normal. Lungs are clear of infiltrates. Prominent skin fold noted over the left chest. No pleural effusion or pneumothorax. Multiple right posterior, posterior-lateral common anterior lateral rib fractures are present. Stable right deformities both shoulders. IMPRESSION: Multiple posterior, posterior-lateral common anterior lateral right rib fractures  are noted. No pneumothorax. Electronically Signed   By: Marcello Moores  Register   On: 01/18/2016 09:15   Dg Chest Port 1 View  Result Date: 01/19/2016 CLINICAL DATA:  Status post fall 3 days ago with multiple right rib fractures. Shortness of breath. History of coronary artery disease and previous MI, asthma-COPD, rheumatoid arthritis. EXAM: PORTABLE CHEST 1 VIEW COMPARISON:  Chest x-ray of January 18, 2016 FINDINGS: The lungs are well-expanded. There is no pneumothorax. Fractures of the posterior lateral aspects of the right seventh, eighth, and ninth ribs are observed. There is no pleural effusion. The heart is top-normal in size. The pulmonary vascularity is normal. There is calcification in the wall of the aortic arch. 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 There is multilevel degenerative disc disease of the thoracic spine with gentle dextrocurvature centered in the mid thoracic spine. IMPRESSION: Hyperinflation consistent with COPD. No pneumothorax, pleural effusion, or pneumonia. No CHF.  Aortic atherosclerosis. Fractures of the seventh through ninth right lateral ribs. 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 Multilevel degenerative disc disease of the thoracic spine. Electronically Signed    By: David  Martinique M.D.   On: 01/19/2016 07:40    ASSESSMENT/PLAN:  Physical deconditioning - for Home health PT, OT and CNA, for therapeutic strengthening exercises; fall precaution  Rib fracture - right-sided multiple rib fracture; continue Ultram 50 mg 1 tab PO Q 8 hours PRN  Dry eyes - continue Xiidra 5% 1 gtt to both eyes BID PRN  Allergic rhinitis - continue Flonase 50 mcg 2 sprays into both nostrils Q D PRN  GERD - continue Zantac 150 mg 1 tab PO BID  Major depression  - continue Risperidone 0.5 mg 1/2 tab = 0.25 mg PO BID  RA - continue Arava 20 mg 1 tab PO Q D and Biofreeze 4% gel to bilateral knees Q HS  Hypertension - well-controlled; continue Toprol XL 100 mg 1 tab PO Q D  Dementia - continue Aricept 10 mg 1 tab PO Q HS  Constipation - continue Colace 100 mg 1 capsule PO Q D  Diabetes Mellitus, type 2 -  continue Levemir 100 units/ml give 14 units SQ Q 12 noon, Novolog 100 units/ml 5 units SQ TIDac and HS for CBG> 200 Lab Results  Component Value Date   HGBA1C 6.8 02/02/2016   GAD - mood is stable; continue Lorazepam 0.5 mg 1 tab PO Q 4 hours PRN  Neuropathic pain - continue Lyrica 75 mg 1 capsule PO Q 6AM, 2PM and 10PM   COPD - on O2 @ 2L/min via [place]; continue Prednisone 5 mg 1 tab PO Q D, Xopenex HFA 45 mcg inhale 2 puffs into lungs Q 4 hours PRN and Duoneb Q 6 hours PRN  CKD, stage 3 -  stable Lab Results  Component Value Date   CREATININE 1.1 02/02/2016   Chronic anemia - stable Lab Results  Component Value Date   HGB 8.7 (A) 02/02/2016       I have filled out patient's discharge paperwork and written prescriptions.  Patient will receive home health PT, OT and CNA.  DME provided:  Standard rolling walker and bedside commode  Total discharge time: Greater than 30 minutes Greater than 50% was spent in counseling and coordination of care with the patient.   Discharge time involved coordination of the discharge process with social worker, nursing  staff and therapy department. Medical justification for home health services/DME verified.      Durenda Age, NP [place]  [facility] [phone]

## 2016-02-19 DIAGNOSIS — F0390 Unspecified dementia without behavioral disturbance: Secondary | ICD-10-CM | POA: Diagnosis not present

## 2016-02-19 DIAGNOSIS — E1122 Type 2 diabetes mellitus with diabetic chronic kidney disease: Secondary | ICD-10-CM | POA: Diagnosis not present

## 2016-02-19 DIAGNOSIS — F411 Generalized anxiety disorder: Secondary | ICD-10-CM | POA: Diagnosis not present

## 2016-02-19 DIAGNOSIS — I129 Hypertensive chronic kidney disease with stage 1 through stage 4 chronic kidney disease, or unspecified chronic kidney disease: Secondary | ICD-10-CM | POA: Diagnosis not present

## 2016-02-19 DIAGNOSIS — S2241XD Multiple fractures of ribs, right side, subsequent encounter for fracture with routine healing: Secondary | ICD-10-CM | POA: Diagnosis not present

## 2016-02-19 DIAGNOSIS — M069 Rheumatoid arthritis, unspecified: Secondary | ICD-10-CM | POA: Diagnosis not present

## 2016-02-19 DIAGNOSIS — K219 Gastro-esophageal reflux disease without esophagitis: Secondary | ICD-10-CM | POA: Diagnosis not present

## 2016-02-19 DIAGNOSIS — Z9181 History of falling: Secondary | ICD-10-CM | POA: Diagnosis not present

## 2016-02-19 DIAGNOSIS — J449 Chronic obstructive pulmonary disease, unspecified: Secondary | ICD-10-CM | POA: Diagnosis not present

## 2016-02-19 DIAGNOSIS — Z794 Long term (current) use of insulin: Secondary | ICD-10-CM | POA: Diagnosis not present

## 2016-02-19 DIAGNOSIS — Z9981 Dependence on supplemental oxygen: Secondary | ICD-10-CM | POA: Diagnosis not present

## 2016-02-19 DIAGNOSIS — I252 Old myocardial infarction: Secondary | ICD-10-CM | POA: Diagnosis not present

## 2016-02-19 DIAGNOSIS — N183 Chronic kidney disease, stage 3 (moderate): Secondary | ICD-10-CM | POA: Diagnosis not present

## 2016-02-19 DIAGNOSIS — D649 Anemia, unspecified: Secondary | ICD-10-CM | POA: Diagnosis not present

## 2016-02-19 DIAGNOSIS — F329 Major depressive disorder, single episode, unspecified: Secondary | ICD-10-CM | POA: Diagnosis not present

## 2016-02-19 DIAGNOSIS — J45909 Unspecified asthma, uncomplicated: Secondary | ICD-10-CM | POA: Diagnosis not present

## 2016-02-20 DIAGNOSIS — M069 Rheumatoid arthritis, unspecified: Secondary | ICD-10-CM | POA: Diagnosis not present

## 2016-02-20 DIAGNOSIS — N183 Chronic kidney disease, stage 3 (moderate): Secondary | ICD-10-CM | POA: Diagnosis not present

## 2016-02-20 DIAGNOSIS — S2241XD Multiple fractures of ribs, right side, subsequent encounter for fracture with routine healing: Secondary | ICD-10-CM | POA: Diagnosis not present

## 2016-02-20 DIAGNOSIS — I129 Hypertensive chronic kidney disease with stage 1 through stage 4 chronic kidney disease, or unspecified chronic kidney disease: Secondary | ICD-10-CM | POA: Diagnosis not present

## 2016-02-20 DIAGNOSIS — E1122 Type 2 diabetes mellitus with diabetic chronic kidney disease: Secondary | ICD-10-CM | POA: Diagnosis not present

## 2016-02-20 DIAGNOSIS — J449 Chronic obstructive pulmonary disease, unspecified: Secondary | ICD-10-CM | POA: Diagnosis not present

## 2016-02-21 DIAGNOSIS — J449 Chronic obstructive pulmonary disease, unspecified: Secondary | ICD-10-CM | POA: Diagnosis not present

## 2016-02-21 DIAGNOSIS — N183 Chronic kidney disease, stage 3 (moderate): Secondary | ICD-10-CM | POA: Diagnosis not present

## 2016-02-21 DIAGNOSIS — M069 Rheumatoid arthritis, unspecified: Secondary | ICD-10-CM | POA: Diagnosis not present

## 2016-02-21 DIAGNOSIS — I129 Hypertensive chronic kidney disease with stage 1 through stage 4 chronic kidney disease, or unspecified chronic kidney disease: Secondary | ICD-10-CM | POA: Diagnosis not present

## 2016-02-21 DIAGNOSIS — S2241XD Multiple fractures of ribs, right side, subsequent encounter for fracture with routine healing: Secondary | ICD-10-CM | POA: Diagnosis not present

## 2016-02-21 DIAGNOSIS — E1122 Type 2 diabetes mellitus with diabetic chronic kidney disease: Secondary | ICD-10-CM | POA: Diagnosis not present

## 2016-02-23 DIAGNOSIS — S2241XD Multiple fractures of ribs, right side, subsequent encounter for fracture with routine healing: Secondary | ICD-10-CM | POA: Diagnosis not present

## 2016-02-23 DIAGNOSIS — M069 Rheumatoid arthritis, unspecified: Secondary | ICD-10-CM | POA: Diagnosis not present

## 2016-02-23 DIAGNOSIS — N183 Chronic kidney disease, stage 3 (moderate): Secondary | ICD-10-CM | POA: Diagnosis not present

## 2016-02-23 DIAGNOSIS — J449 Chronic obstructive pulmonary disease, unspecified: Secondary | ICD-10-CM | POA: Diagnosis not present

## 2016-02-23 DIAGNOSIS — E1122 Type 2 diabetes mellitus with diabetic chronic kidney disease: Secondary | ICD-10-CM | POA: Diagnosis not present

## 2016-02-23 DIAGNOSIS — I129 Hypertensive chronic kidney disease with stage 1 through stage 4 chronic kidney disease, or unspecified chronic kidney disease: Secondary | ICD-10-CM | POA: Diagnosis not present

## 2016-02-27 DIAGNOSIS — E1122 Type 2 diabetes mellitus with diabetic chronic kidney disease: Secondary | ICD-10-CM | POA: Diagnosis not present

## 2016-02-27 DIAGNOSIS — J449 Chronic obstructive pulmonary disease, unspecified: Secondary | ICD-10-CM | POA: Diagnosis not present

## 2016-02-27 DIAGNOSIS — N183 Chronic kidney disease, stage 3 (moderate): Secondary | ICD-10-CM | POA: Diagnosis not present

## 2016-02-27 DIAGNOSIS — I129 Hypertensive chronic kidney disease with stage 1 through stage 4 chronic kidney disease, or unspecified chronic kidney disease: Secondary | ICD-10-CM | POA: Diagnosis not present

## 2016-02-27 DIAGNOSIS — S2241XD Multiple fractures of ribs, right side, subsequent encounter for fracture with routine healing: Secondary | ICD-10-CM | POA: Diagnosis not present

## 2016-02-27 DIAGNOSIS — M069 Rheumatoid arthritis, unspecified: Secondary | ICD-10-CM | POA: Diagnosis not present

## 2016-02-28 ENCOUNTER — Other Ambulatory Visit: Payer: Self-pay | Admitting: Oncology

## 2016-02-28 DIAGNOSIS — S2241XD Multiple fractures of ribs, right side, subsequent encounter for fracture with routine healing: Secondary | ICD-10-CM | POA: Diagnosis not present

## 2016-02-28 DIAGNOSIS — I129 Hypertensive chronic kidney disease with stage 1 through stage 4 chronic kidney disease, or unspecified chronic kidney disease: Secondary | ICD-10-CM | POA: Diagnosis not present

## 2016-02-28 DIAGNOSIS — J449 Chronic obstructive pulmonary disease, unspecified: Secondary | ICD-10-CM | POA: Diagnosis not present

## 2016-02-28 DIAGNOSIS — M069 Rheumatoid arthritis, unspecified: Secondary | ICD-10-CM | POA: Diagnosis not present

## 2016-02-28 DIAGNOSIS — N183 Chronic kidney disease, stage 3 (moderate): Secondary | ICD-10-CM | POA: Diagnosis not present

## 2016-02-28 DIAGNOSIS — E1122 Type 2 diabetes mellitus with diabetic chronic kidney disease: Secondary | ICD-10-CM | POA: Diagnosis not present

## 2016-02-28 DIAGNOSIS — D500 Iron deficiency anemia secondary to blood loss (chronic): Secondary | ICD-10-CM

## 2016-02-29 ENCOUNTER — Other Ambulatory Visit: Payer: Self-pay

## 2016-02-29 ENCOUNTER — Ambulatory Visit: Payer: Self-pay | Admitting: Oncology

## 2016-02-29 ENCOUNTER — Other Ambulatory Visit: Payer: Self-pay | Admitting: Oncology

## 2016-02-29 DIAGNOSIS — N183 Chronic kidney disease, stage 3 (moderate): Secondary | ICD-10-CM | POA: Diagnosis not present

## 2016-02-29 DIAGNOSIS — J449 Chronic obstructive pulmonary disease, unspecified: Secondary | ICD-10-CM | POA: Diagnosis not present

## 2016-02-29 DIAGNOSIS — I129 Hypertensive chronic kidney disease with stage 1 through stage 4 chronic kidney disease, or unspecified chronic kidney disease: Secondary | ICD-10-CM | POA: Diagnosis not present

## 2016-02-29 DIAGNOSIS — M069 Rheumatoid arthritis, unspecified: Secondary | ICD-10-CM | POA: Diagnosis not present

## 2016-02-29 DIAGNOSIS — S2241XD Multiple fractures of ribs, right side, subsequent encounter for fracture with routine healing: Secondary | ICD-10-CM | POA: Diagnosis not present

## 2016-02-29 DIAGNOSIS — E1122 Type 2 diabetes mellitus with diabetic chronic kidney disease: Secondary | ICD-10-CM | POA: Diagnosis not present

## 2016-03-01 DIAGNOSIS — I129 Hypertensive chronic kidney disease with stage 1 through stage 4 chronic kidney disease, or unspecified chronic kidney disease: Secondary | ICD-10-CM | POA: Diagnosis not present

## 2016-03-01 DIAGNOSIS — S2241XD Multiple fractures of ribs, right side, subsequent encounter for fracture with routine healing: Secondary | ICD-10-CM | POA: Diagnosis not present

## 2016-03-01 DIAGNOSIS — E1122 Type 2 diabetes mellitus with diabetic chronic kidney disease: Secondary | ICD-10-CM | POA: Diagnosis not present

## 2016-03-01 DIAGNOSIS — N183 Chronic kidney disease, stage 3 (moderate): Secondary | ICD-10-CM | POA: Diagnosis not present

## 2016-03-01 DIAGNOSIS — M069 Rheumatoid arthritis, unspecified: Secondary | ICD-10-CM | POA: Diagnosis not present

## 2016-03-01 DIAGNOSIS — J449 Chronic obstructive pulmonary disease, unspecified: Secondary | ICD-10-CM | POA: Diagnosis not present

## 2016-03-05 DIAGNOSIS — N183 Chronic kidney disease, stage 3 (moderate): Secondary | ICD-10-CM | POA: Diagnosis not present

## 2016-03-05 DIAGNOSIS — I129 Hypertensive chronic kidney disease with stage 1 through stage 4 chronic kidney disease, or unspecified chronic kidney disease: Secondary | ICD-10-CM | POA: Diagnosis not present

## 2016-03-05 DIAGNOSIS — E1122 Type 2 diabetes mellitus with diabetic chronic kidney disease: Secondary | ICD-10-CM | POA: Diagnosis not present

## 2016-03-05 DIAGNOSIS — M069 Rheumatoid arthritis, unspecified: Secondary | ICD-10-CM | POA: Diagnosis not present

## 2016-03-05 DIAGNOSIS — S2241XD Multiple fractures of ribs, right side, subsequent encounter for fracture with routine healing: Secondary | ICD-10-CM | POA: Diagnosis not present

## 2016-03-05 DIAGNOSIS — J449 Chronic obstructive pulmonary disease, unspecified: Secondary | ICD-10-CM | POA: Diagnosis not present

## 2016-03-08 DIAGNOSIS — E1122 Type 2 diabetes mellitus with diabetic chronic kidney disease: Secondary | ICD-10-CM | POA: Diagnosis not present

## 2016-03-08 DIAGNOSIS — S2241XD Multiple fractures of ribs, right side, subsequent encounter for fracture with routine healing: Secondary | ICD-10-CM | POA: Diagnosis not present

## 2016-03-08 DIAGNOSIS — I129 Hypertensive chronic kidney disease with stage 1 through stage 4 chronic kidney disease, or unspecified chronic kidney disease: Secondary | ICD-10-CM | POA: Diagnosis not present

## 2016-03-08 DIAGNOSIS — J449 Chronic obstructive pulmonary disease, unspecified: Secondary | ICD-10-CM | POA: Diagnosis not present

## 2016-03-08 DIAGNOSIS — M069 Rheumatoid arthritis, unspecified: Secondary | ICD-10-CM | POA: Diagnosis not present

## 2016-03-08 DIAGNOSIS — N183 Chronic kidney disease, stage 3 (moderate): Secondary | ICD-10-CM | POA: Diagnosis not present

## 2016-03-11 DIAGNOSIS — M069 Rheumatoid arthritis, unspecified: Secondary | ICD-10-CM | POA: Diagnosis not present

## 2016-03-11 DIAGNOSIS — S2241XD Multiple fractures of ribs, right side, subsequent encounter for fracture with routine healing: Secondary | ICD-10-CM | POA: Diagnosis not present

## 2016-03-11 DIAGNOSIS — E1122 Type 2 diabetes mellitus with diabetic chronic kidney disease: Secondary | ICD-10-CM | POA: Diagnosis not present

## 2016-03-11 DIAGNOSIS — N183 Chronic kidney disease, stage 3 (moderate): Secondary | ICD-10-CM | POA: Diagnosis not present

## 2016-03-11 DIAGNOSIS — I129 Hypertensive chronic kidney disease with stage 1 through stage 4 chronic kidney disease, or unspecified chronic kidney disease: Secondary | ICD-10-CM | POA: Diagnosis not present

## 2016-03-11 DIAGNOSIS — J449 Chronic obstructive pulmonary disease, unspecified: Secondary | ICD-10-CM | POA: Diagnosis not present

## 2016-03-13 DIAGNOSIS — J449 Chronic obstructive pulmonary disease, unspecified: Secondary | ICD-10-CM | POA: Diagnosis not present

## 2016-03-13 DIAGNOSIS — S2241XD Multiple fractures of ribs, right side, subsequent encounter for fracture with routine healing: Secondary | ICD-10-CM | POA: Diagnosis not present

## 2016-03-13 DIAGNOSIS — M069 Rheumatoid arthritis, unspecified: Secondary | ICD-10-CM | POA: Diagnosis not present

## 2016-03-13 DIAGNOSIS — N183 Chronic kidney disease, stage 3 (moderate): Secondary | ICD-10-CM | POA: Diagnosis not present

## 2016-03-13 DIAGNOSIS — I129 Hypertensive chronic kidney disease with stage 1 through stage 4 chronic kidney disease, or unspecified chronic kidney disease: Secondary | ICD-10-CM | POA: Diagnosis not present

## 2016-03-13 DIAGNOSIS — E1122 Type 2 diabetes mellitus with diabetic chronic kidney disease: Secondary | ICD-10-CM | POA: Diagnosis not present

## 2016-03-14 ENCOUNTER — Other Ambulatory Visit: Payer: Self-pay | Admitting: Adult Health

## 2016-03-14 DIAGNOSIS — R069 Unspecified abnormalities of breathing: Secondary | ICD-10-CM | POA: Diagnosis not present

## 2016-03-15 DIAGNOSIS — S2241XD Multiple fractures of ribs, right side, subsequent encounter for fracture with routine healing: Secondary | ICD-10-CM | POA: Diagnosis not present

## 2016-03-15 DIAGNOSIS — I129 Hypertensive chronic kidney disease with stage 1 through stage 4 chronic kidney disease, or unspecified chronic kidney disease: Secondary | ICD-10-CM | POA: Diagnosis not present

## 2016-03-15 DIAGNOSIS — E1122 Type 2 diabetes mellitus with diabetic chronic kidney disease: Secondary | ICD-10-CM | POA: Diagnosis not present

## 2016-03-15 DIAGNOSIS — M069 Rheumatoid arthritis, unspecified: Secondary | ICD-10-CM | POA: Diagnosis not present

## 2016-03-15 DIAGNOSIS — J449 Chronic obstructive pulmonary disease, unspecified: Secondary | ICD-10-CM | POA: Diagnosis not present

## 2016-03-15 DIAGNOSIS — N183 Chronic kidney disease, stage 3 (moderate): Secondary | ICD-10-CM | POA: Diagnosis not present

## 2016-03-18 DIAGNOSIS — D509 Iron deficiency anemia, unspecified: Secondary | ICD-10-CM | POA: Diagnosis not present

## 2016-03-18 DIAGNOSIS — I10 Essential (primary) hypertension: Secondary | ICD-10-CM | POA: Diagnosis not present

## 2016-03-18 DIAGNOSIS — E119 Type 2 diabetes mellitus without complications: Secondary | ICD-10-CM | POA: Diagnosis not present

## 2016-03-19 DIAGNOSIS — S2241XD Multiple fractures of ribs, right side, subsequent encounter for fracture with routine healing: Secondary | ICD-10-CM | POA: Diagnosis not present

## 2016-03-19 DIAGNOSIS — N183 Chronic kidney disease, stage 3 (moderate): Secondary | ICD-10-CM | POA: Diagnosis not present

## 2016-03-19 DIAGNOSIS — M069 Rheumatoid arthritis, unspecified: Secondary | ICD-10-CM | POA: Diagnosis not present

## 2016-03-19 DIAGNOSIS — E1122 Type 2 diabetes mellitus with diabetic chronic kidney disease: Secondary | ICD-10-CM | POA: Diagnosis not present

## 2016-03-19 DIAGNOSIS — I129 Hypertensive chronic kidney disease with stage 1 through stage 4 chronic kidney disease, or unspecified chronic kidney disease: Secondary | ICD-10-CM | POA: Diagnosis not present

## 2016-03-19 DIAGNOSIS — J449 Chronic obstructive pulmonary disease, unspecified: Secondary | ICD-10-CM | POA: Diagnosis not present

## 2016-03-20 DIAGNOSIS — J449 Chronic obstructive pulmonary disease, unspecified: Secondary | ICD-10-CM | POA: Diagnosis not present

## 2016-03-20 DIAGNOSIS — S2241XD Multiple fractures of ribs, right side, subsequent encounter for fracture with routine healing: Secondary | ICD-10-CM | POA: Diagnosis not present

## 2016-03-20 DIAGNOSIS — E1122 Type 2 diabetes mellitus with diabetic chronic kidney disease: Secondary | ICD-10-CM | POA: Diagnosis not present

## 2016-03-20 DIAGNOSIS — I129 Hypertensive chronic kidney disease with stage 1 through stage 4 chronic kidney disease, or unspecified chronic kidney disease: Secondary | ICD-10-CM | POA: Diagnosis not present

## 2016-03-20 DIAGNOSIS — M069 Rheumatoid arthritis, unspecified: Secondary | ICD-10-CM | POA: Diagnosis not present

## 2016-03-20 DIAGNOSIS — N183 Chronic kidney disease, stage 3 (moderate): Secondary | ICD-10-CM | POA: Diagnosis not present

## 2016-03-21 DIAGNOSIS — S2241XD Multiple fractures of ribs, right side, subsequent encounter for fracture with routine healing: Secondary | ICD-10-CM | POA: Diagnosis not present

## 2016-03-21 DIAGNOSIS — M069 Rheumatoid arthritis, unspecified: Secondary | ICD-10-CM | POA: Diagnosis not present

## 2016-03-21 DIAGNOSIS — J449 Chronic obstructive pulmonary disease, unspecified: Secondary | ICD-10-CM | POA: Diagnosis not present

## 2016-03-21 DIAGNOSIS — E1122 Type 2 diabetes mellitus with diabetic chronic kidney disease: Secondary | ICD-10-CM | POA: Diagnosis not present

## 2016-03-21 DIAGNOSIS — N183 Chronic kidney disease, stage 3 (moderate): Secondary | ICD-10-CM | POA: Diagnosis not present

## 2016-03-21 DIAGNOSIS — I129 Hypertensive chronic kidney disease with stage 1 through stage 4 chronic kidney disease, or unspecified chronic kidney disease: Secondary | ICD-10-CM | POA: Diagnosis not present

## 2016-03-22 DIAGNOSIS — N183 Chronic kidney disease, stage 3 (moderate): Secondary | ICD-10-CM | POA: Diagnosis not present

## 2016-03-22 DIAGNOSIS — E1122 Type 2 diabetes mellitus with diabetic chronic kidney disease: Secondary | ICD-10-CM | POA: Diagnosis not present

## 2016-03-22 DIAGNOSIS — S2241XD Multiple fractures of ribs, right side, subsequent encounter for fracture with routine healing: Secondary | ICD-10-CM | POA: Diagnosis not present

## 2016-03-22 DIAGNOSIS — M069 Rheumatoid arthritis, unspecified: Secondary | ICD-10-CM | POA: Diagnosis not present

## 2016-03-22 DIAGNOSIS — J449 Chronic obstructive pulmonary disease, unspecified: Secondary | ICD-10-CM | POA: Diagnosis not present

## 2016-03-22 DIAGNOSIS — I129 Hypertensive chronic kidney disease with stage 1 through stage 4 chronic kidney disease, or unspecified chronic kidney disease: Secondary | ICD-10-CM | POA: Diagnosis not present

## 2016-03-25 DIAGNOSIS — E1122 Type 2 diabetes mellitus with diabetic chronic kidney disease: Secondary | ICD-10-CM | POA: Diagnosis not present

## 2016-03-25 DIAGNOSIS — J449 Chronic obstructive pulmonary disease, unspecified: Secondary | ICD-10-CM | POA: Diagnosis not present

## 2016-03-25 DIAGNOSIS — M069 Rheumatoid arthritis, unspecified: Secondary | ICD-10-CM | POA: Diagnosis not present

## 2016-03-25 DIAGNOSIS — S2241XD Multiple fractures of ribs, right side, subsequent encounter for fracture with routine healing: Secondary | ICD-10-CM | POA: Diagnosis not present

## 2016-03-25 DIAGNOSIS — I129 Hypertensive chronic kidney disease with stage 1 through stage 4 chronic kidney disease, or unspecified chronic kidney disease: Secondary | ICD-10-CM | POA: Diagnosis not present

## 2016-03-25 DIAGNOSIS — N183 Chronic kidney disease, stage 3 (moderate): Secondary | ICD-10-CM | POA: Diagnosis not present

## 2016-03-26 DIAGNOSIS — I129 Hypertensive chronic kidney disease with stage 1 through stage 4 chronic kidney disease, or unspecified chronic kidney disease: Secondary | ICD-10-CM | POA: Diagnosis not present

## 2016-03-26 DIAGNOSIS — M069 Rheumatoid arthritis, unspecified: Secondary | ICD-10-CM | POA: Diagnosis not present

## 2016-03-26 DIAGNOSIS — N183 Chronic kidney disease, stage 3 (moderate): Secondary | ICD-10-CM | POA: Diagnosis not present

## 2016-03-26 DIAGNOSIS — S2241XD Multiple fractures of ribs, right side, subsequent encounter for fracture with routine healing: Secondary | ICD-10-CM | POA: Diagnosis not present

## 2016-03-26 DIAGNOSIS — E1122 Type 2 diabetes mellitus with diabetic chronic kidney disease: Secondary | ICD-10-CM | POA: Diagnosis not present

## 2016-03-26 DIAGNOSIS — J449 Chronic obstructive pulmonary disease, unspecified: Secondary | ICD-10-CM | POA: Diagnosis not present

## 2016-03-27 DIAGNOSIS — M069 Rheumatoid arthritis, unspecified: Secondary | ICD-10-CM | POA: Diagnosis not present

## 2016-03-27 DIAGNOSIS — J449 Chronic obstructive pulmonary disease, unspecified: Secondary | ICD-10-CM | POA: Diagnosis not present

## 2016-03-27 DIAGNOSIS — E1122 Type 2 diabetes mellitus with diabetic chronic kidney disease: Secondary | ICD-10-CM | POA: Diagnosis not present

## 2016-03-27 DIAGNOSIS — I129 Hypertensive chronic kidney disease with stage 1 through stage 4 chronic kidney disease, or unspecified chronic kidney disease: Secondary | ICD-10-CM | POA: Diagnosis not present

## 2016-03-27 DIAGNOSIS — S2241XD Multiple fractures of ribs, right side, subsequent encounter for fracture with routine healing: Secondary | ICD-10-CM | POA: Diagnosis not present

## 2016-03-27 DIAGNOSIS — N183 Chronic kidney disease, stage 3 (moderate): Secondary | ICD-10-CM | POA: Diagnosis not present

## 2016-03-28 ENCOUNTER — Other Ambulatory Visit: Payer: Self-pay | Admitting: *Deleted

## 2016-03-28 ENCOUNTER — Encounter: Payer: Self-pay | Admitting: *Deleted

## 2016-03-28 DIAGNOSIS — E1122 Type 2 diabetes mellitus with diabetic chronic kidney disease: Secondary | ICD-10-CM | POA: Diagnosis not present

## 2016-03-28 DIAGNOSIS — I129 Hypertensive chronic kidney disease with stage 1 through stage 4 chronic kidney disease, or unspecified chronic kidney disease: Secondary | ICD-10-CM | POA: Diagnosis not present

## 2016-03-28 DIAGNOSIS — J449 Chronic obstructive pulmonary disease, unspecified: Secondary | ICD-10-CM | POA: Diagnosis not present

## 2016-03-28 DIAGNOSIS — M069 Rheumatoid arthritis, unspecified: Secondary | ICD-10-CM | POA: Diagnosis not present

## 2016-03-28 DIAGNOSIS — N183 Chronic kidney disease, stage 3 (moderate): Secondary | ICD-10-CM | POA: Diagnosis not present

## 2016-03-28 DIAGNOSIS — S2241XD Multiple fractures of ribs, right side, subsequent encounter for fracture with routine healing: Secondary | ICD-10-CM | POA: Diagnosis not present

## 2016-03-28 MED ORDER — PREGABALIN 75 MG PO CAPS: 75.0000 mg | ORAL_CAPSULE | Freq: Three times a day (TID) | ORAL | 1 refills | Status: DC

## 2016-03-28 NOTE — Progress Notes (Signed)
Faxed printed/signed rx refill lyrica for [id number] 1 refill. Fax: [phone]. Received confirmation.

## 2016-04-01 DIAGNOSIS — S2241XD Multiple fractures of ribs, right side, subsequent encounter for fracture with routine healing: Secondary | ICD-10-CM | POA: Diagnosis not present

## 2016-04-01 DIAGNOSIS — N183 Chronic kidney disease, stage 3 (moderate): Secondary | ICD-10-CM | POA: Diagnosis not present

## 2016-04-01 DIAGNOSIS — E1122 Type 2 diabetes mellitus with diabetic chronic kidney disease: Secondary | ICD-10-CM | POA: Diagnosis not present

## 2016-04-01 DIAGNOSIS — I129 Hypertensive chronic kidney disease with stage 1 through stage 4 chronic kidney disease, or unspecified chronic kidney disease: Secondary | ICD-10-CM | POA: Diagnosis not present

## 2016-04-01 DIAGNOSIS — M069 Rheumatoid arthritis, unspecified: Secondary | ICD-10-CM | POA: Diagnosis not present

## 2016-04-01 DIAGNOSIS — J449 Chronic obstructive pulmonary disease, unspecified: Secondary | ICD-10-CM | POA: Diagnosis not present

## 2016-04-02 DIAGNOSIS — I129 Hypertensive chronic kidney disease with stage 1 through stage 4 chronic kidney disease, or unspecified chronic kidney disease: Secondary | ICD-10-CM | POA: Diagnosis not present

## 2016-04-02 DIAGNOSIS — M069 Rheumatoid arthritis, unspecified: Secondary | ICD-10-CM | POA: Diagnosis not present

## 2016-04-02 DIAGNOSIS — S2241XD Multiple fractures of ribs, right side, subsequent encounter for fracture with routine healing: Secondary | ICD-10-CM | POA: Diagnosis not present

## 2016-04-02 DIAGNOSIS — N183 Chronic kidney disease, stage 3 (moderate): Secondary | ICD-10-CM | POA: Diagnosis not present

## 2016-04-02 DIAGNOSIS — J449 Chronic obstructive pulmonary disease, unspecified: Secondary | ICD-10-CM | POA: Diagnosis not present

## 2016-04-02 DIAGNOSIS — E1122 Type 2 diabetes mellitus with diabetic chronic kidney disease: Secondary | ICD-10-CM | POA: Diagnosis not present

## 2016-04-03 DIAGNOSIS — E1122 Type 2 diabetes mellitus with diabetic chronic kidney disease: Secondary | ICD-10-CM | POA: Diagnosis not present

## 2016-04-03 DIAGNOSIS — N183 Chronic kidney disease, stage 3 (moderate): Secondary | ICD-10-CM | POA: Diagnosis not present

## 2016-04-03 DIAGNOSIS — J449 Chronic obstructive pulmonary disease, unspecified: Secondary | ICD-10-CM | POA: Diagnosis not present

## 2016-04-03 DIAGNOSIS — I129 Hypertensive chronic kidney disease with stage 1 through stage 4 chronic kidney disease, or unspecified chronic kidney disease: Secondary | ICD-10-CM | POA: Diagnosis not present

## 2016-04-03 DIAGNOSIS — S2241XD Multiple fractures of ribs, right side, subsequent encounter for fracture with routine healing: Secondary | ICD-10-CM | POA: Diagnosis not present

## 2016-04-03 DIAGNOSIS — M069 Rheumatoid arthritis, unspecified: Secondary | ICD-10-CM | POA: Diagnosis not present

## 2016-04-04 DIAGNOSIS — M069 Rheumatoid arthritis, unspecified: Secondary | ICD-10-CM | POA: Diagnosis not present

## 2016-04-04 DIAGNOSIS — I129 Hypertensive chronic kidney disease with stage 1 through stage 4 chronic kidney disease, or unspecified chronic kidney disease: Secondary | ICD-10-CM | POA: Diagnosis not present

## 2016-04-04 DIAGNOSIS — N183 Chronic kidney disease, stage 3 (moderate): Secondary | ICD-10-CM | POA: Diagnosis not present

## 2016-04-04 DIAGNOSIS — E1122 Type 2 diabetes mellitus with diabetic chronic kidney disease: Secondary | ICD-10-CM | POA: Diagnosis not present

## 2016-04-04 DIAGNOSIS — J449 Chronic obstructive pulmonary disease, unspecified: Secondary | ICD-10-CM | POA: Diagnosis not present

## 2016-04-04 DIAGNOSIS — S2241XD Multiple fractures of ribs, right side, subsequent encounter for fracture with routine healing: Secondary | ICD-10-CM | POA: Diagnosis not present

## 2016-04-09 ENCOUNTER — Telehealth: Payer: Self-pay

## 2016-04-09 NOTE — Telephone Encounter (Signed)
S/w husband: pt has not had CBCs drawn at [facility] in 2 months. He states pt fell, broke ribs and was in rehab for awhile. She is at home. He would still like her CBCs to be drawn at [facility]. Explained Dr Marko Plume is retiring at end of January.  Dr Jani Gravel drew labs earlier this month. We will obtain those labs and set pt up for a lab draw at [facility] in January. Will discuss next appts at that time.

## 2016-04-10 DIAGNOSIS — M069 Rheumatoid arthritis, unspecified: Secondary | ICD-10-CM | POA: Diagnosis not present

## 2016-04-10 DIAGNOSIS — N183 Chronic kidney disease, stage 3 (moderate): Secondary | ICD-10-CM | POA: Diagnosis not present

## 2016-04-10 DIAGNOSIS — I129 Hypertensive chronic kidney disease with stage 1 through stage 4 chronic kidney disease, or unspecified chronic kidney disease: Secondary | ICD-10-CM | POA: Diagnosis not present

## 2016-04-10 DIAGNOSIS — S2241XD Multiple fractures of ribs, right side, subsequent encounter for fracture with routine healing: Secondary | ICD-10-CM | POA: Diagnosis not present

## 2016-04-10 DIAGNOSIS — J449 Chronic obstructive pulmonary disease, unspecified: Secondary | ICD-10-CM | POA: Diagnosis not present

## 2016-04-10 DIAGNOSIS — E1122 Type 2 diabetes mellitus with diabetic chronic kidney disease: Secondary | ICD-10-CM | POA: Diagnosis not present

## 2016-04-10 NOTE — Telephone Encounter (Signed)
Left 2nd voice message requesting lab from Dr Maudie Mercury drawn earlier this month.

## 2016-04-10 NOTE — Telephone Encounter (Signed)
Labs received from Dr Jeneen Rinks and placed on Dr Marko Plume desk.

## 2016-04-16 ENCOUNTER — Telehealth: Payer: Self-pay

## 2016-04-16 ENCOUNTER — Other Ambulatory Visit: Payer: Self-pay | Admitting: Oncology

## 2016-04-16 NOTE — Telephone Encounter (Signed)
-----   Message from Gordy Levan, MD sent at 04/16/2016  8:18 AM EST ----- Regarding: labs and follow up RE RN phone note from 12-26  Please let husband know that we received labs from Dr Maudie Mercury dated 03-18-16, with hgb in good range then at 10.1.  I think it would be best and appropriate to have Dr Maudie Mercury follow her labs now, as cause of anemia is known and not related to malignancy. Next CBC by PCP in 1-2 months from now seems reasonable, likely can get this when she sees PCP for other reason. I can call Dr Julianne Rice office to let them know this recommendationdirectly, if husband would like me to do that. If not, please send this note with RN information to Dr Maudie Mercury.  I see that patient's next apt at [facility] is with Dr Irene Limbo in May, which she would not need to keep if primary MD follows labs. Tell them that I hope she is doing better from the broken ribs  Thank you

## 2016-04-16 NOTE — Telephone Encounter (Signed)
S/w husband per Dr Mariana Kaufman attached message. He was glad for hgb level. He states pt is still using walker and not back on a cane yet, but not c/o rib pain.   Forwarded note to Dr Maudie Mercury for recommendation of PCP following anemia.   inbasket sent for pt transfer to Dr Irene Limbo b/c Dr Marko Plume is retiring.

## 2016-04-19 DIAGNOSIS — M50822 Other cervical disc disorders at C5-C6 level: Secondary | ICD-10-CM | POA: Diagnosis not present

## 2016-04-19 DIAGNOSIS — M50821 Other cervical disc disorders at C4-C5 level: Secondary | ICD-10-CM | POA: Diagnosis not present

## 2016-04-19 DIAGNOSIS — M50823 Other cervical disc disorders at C6-C7 level: Secondary | ICD-10-CM | POA: Diagnosis not present

## 2016-05-07 DIAGNOSIS — Z0000 Encounter for general adult medical examination without abnormal findings: Secondary | ICD-10-CM | POA: Diagnosis not present

## 2016-05-07 DIAGNOSIS — S2241XD Multiple fractures of ribs, right side, subsequent encounter for fracture with routine healing: Secondary | ICD-10-CM | POA: Diagnosis not present

## 2016-05-07 DIAGNOSIS — E119 Type 2 diabetes mellitus without complications: Secondary | ICD-10-CM | POA: Diagnosis not present

## 2016-05-07 DIAGNOSIS — M059 Rheumatoid arthritis with rheumatoid factor, unspecified: Secondary | ICD-10-CM | POA: Diagnosis not present

## 2016-05-07 DIAGNOSIS — I10 Essential (primary) hypertension: Secondary | ICD-10-CM | POA: Diagnosis not present

## 2016-05-14 ENCOUNTER — Inpatient Hospital Stay (HOSPITAL_COMMUNITY)
Admission: EM | Admit: 2016-05-14 | Discharge: 2016-05-23 | DRG: 064 | Disposition: A | Discharge status: Skilled Nursing Facility | Payer: Medicare Other | Attending: Internal Medicine | Admitting: Internal Medicine

## 2016-05-14 ENCOUNTER — Emergency Department (HOSPITAL_COMMUNITY): Payer: Medicare Other

## 2016-05-14 ENCOUNTER — Inpatient Hospital Stay (HOSPITAL_COMMUNITY): Payer: Medicare Other

## 2016-05-14 ENCOUNTER — Encounter (HOSPITAL_COMMUNITY): Payer: Self-pay

## 2016-05-14 DIAGNOSIS — M069 Rheumatoid arthritis, unspecified: Secondary | ICD-10-CM | POA: Diagnosis present

## 2016-05-14 DIAGNOSIS — I639 Cerebral infarction, unspecified: Secondary | ICD-10-CM | POA: Diagnosis not present

## 2016-05-14 DIAGNOSIS — J449 Chronic obstructive pulmonary disease, unspecified: Secondary | ICD-10-CM | POA: Diagnosis present

## 2016-05-14 DIAGNOSIS — E43 Unspecified severe protein-calorie malnutrition: Secondary | ICD-10-CM | POA: Diagnosis present

## 2016-05-14 DIAGNOSIS — I10 Essential (primary) hypertension: Secondary | ICD-10-CM | POA: Diagnosis not present

## 2016-05-14 DIAGNOSIS — Z823 Family history of stroke: Secondary | ICD-10-CM | POA: Diagnosis not present

## 2016-05-14 DIAGNOSIS — E876 Hypokalemia: Secondary | ICD-10-CM | POA: Diagnosis not present

## 2016-05-14 DIAGNOSIS — M4856XA Collapsed vertebra, not elsewhere classified, lumbar region, initial encounter for fracture: Secondary | ICD-10-CM | POA: Diagnosis present

## 2016-05-14 DIAGNOSIS — E46 Unspecified protein-calorie malnutrition: Secondary | ICD-10-CM | POA: Diagnosis not present

## 2016-05-14 DIAGNOSIS — Z8249 Family history of ischemic heart disease and other diseases of the circulatory system: Secondary | ICD-10-CM

## 2016-05-14 DIAGNOSIS — I638 Other cerebral infarction: Secondary | ICD-10-CM

## 2016-05-14 DIAGNOSIS — J9610 Chronic respiratory failure, unspecified whether with hypoxia or hypercapnia: Secondary | ICD-10-CM | POA: Diagnosis present

## 2016-05-14 DIAGNOSIS — I63213 Cerebral infarction due to unspecified occlusion or stenosis of bilateral vertebral arteries: Secondary | ICD-10-CM

## 2016-05-14 DIAGNOSIS — R269 Unspecified abnormalities of gait and mobility: Secondary | ICD-10-CM | POA: Diagnosis not present

## 2016-05-14 DIAGNOSIS — Z79899 Other long term (current) drug therapy: Secondary | ICD-10-CM | POA: Diagnosis not present

## 2016-05-14 DIAGNOSIS — D500 Iron deficiency anemia secondary to blood loss (chronic): Secondary | ICD-10-CM | POA: Diagnosis present

## 2016-05-14 DIAGNOSIS — E118 Type 2 diabetes mellitus with unspecified complications: Secondary | ICD-10-CM | POA: Diagnosis not present

## 2016-05-14 DIAGNOSIS — I129 Hypertensive chronic kidney disease with stage 1 through stage 4 chronic kidney disease, or unspecified chronic kidney disease: Secondary | ICD-10-CM | POA: Diagnosis present

## 2016-05-14 DIAGNOSIS — Z681 Body mass index (BMI) 19 or less, adult: Secondary | ICD-10-CM

## 2016-05-14 DIAGNOSIS — E440 Moderate protein-calorie malnutrition: Secondary | ICD-10-CM | POA: Diagnosis not present

## 2016-05-14 DIAGNOSIS — R410 Disorientation, unspecified: Secondary | ICD-10-CM

## 2016-05-14 DIAGNOSIS — E1122 Type 2 diabetes mellitus with diabetic chronic kidney disease: Secondary | ICD-10-CM | POA: Diagnosis present

## 2016-05-14 DIAGNOSIS — G8194 Hemiplegia, unspecified affecting left nondominant side: Secondary | ICD-10-CM | POA: Diagnosis present

## 2016-05-14 DIAGNOSIS — J9611 Chronic respiratory failure with hypoxia: Secondary | ICD-10-CM

## 2016-05-14 DIAGNOSIS — J431 Panlobular emphysema: Secondary | ICD-10-CM | POA: Diagnosis not present

## 2016-05-14 DIAGNOSIS — Z794 Long term (current) use of insulin: Secondary | ICD-10-CM

## 2016-05-14 DIAGNOSIS — N183 Chronic kidney disease, stage 3 unspecified: Secondary | ICD-10-CM | POA: Diagnosis present

## 2016-05-14 DIAGNOSIS — R278 Other lack of coordination: Secondary | ICD-10-CM | POA: Diagnosis not present

## 2016-05-14 DIAGNOSIS — I6389 Other cerebral infarction: Secondary | ICD-10-CM | POA: Diagnosis present

## 2016-05-14 DIAGNOSIS — K219 Gastro-esophageal reflux disease without esophagitis: Secondary | ICD-10-CM | POA: Diagnosis present

## 2016-05-14 DIAGNOSIS — E11649 Type 2 diabetes mellitus with hypoglycemia without coma: Secondary | ICD-10-CM | POA: Diagnosis not present

## 2016-05-14 DIAGNOSIS — I6789 Other cerebrovascular disease: Secondary | ICD-10-CM | POA: Diagnosis not present

## 2016-05-14 DIAGNOSIS — I252 Old myocardial infarction: Secondary | ICD-10-CM

## 2016-05-14 DIAGNOSIS — F0390 Unspecified dementia without behavioral disturbance: Secondary | ICD-10-CM | POA: Diagnosis present

## 2016-05-14 DIAGNOSIS — I2510 Atherosclerotic heart disease of native coronary artery without angina pectoris: Secondary | ICD-10-CM | POA: Diagnosis present

## 2016-05-14 DIAGNOSIS — Z7952 Long term (current) use of systemic steroids: Secondary | ICD-10-CM

## 2016-05-14 DIAGNOSIS — I7101 Dissection of thoracic aorta: Secondary | ICD-10-CM | POA: Diagnosis present

## 2016-05-14 DIAGNOSIS — IMO0002 Reserved for concepts with insufficient information to code with codable children: Secondary | ICD-10-CM

## 2016-05-14 DIAGNOSIS — I7100 Dissection of unspecified site of aorta: Secondary | ICD-10-CM | POA: Diagnosis not present

## 2016-05-14 DIAGNOSIS — E1165 Type 2 diabetes mellitus with hyperglycemia: Secondary | ICD-10-CM

## 2016-05-14 DIAGNOSIS — Z85828 Personal history of other malignant neoplasm of skin: Secondary | ICD-10-CM

## 2016-05-14 DIAGNOSIS — I7103 Dissection of thoracoabdominal aorta: Secondary | ICD-10-CM | POA: Diagnosis not present

## 2016-05-14 DIAGNOSIS — E785 Hyperlipidemia, unspecified: Secondary | ICD-10-CM | POA: Diagnosis present

## 2016-05-14 DIAGNOSIS — I6501 Occlusion and stenosis of right vertebral artery: Secondary | ICD-10-CM | POA: Diagnosis not present

## 2016-05-14 DIAGNOSIS — H538 Other visual disturbances: Secondary | ICD-10-CM | POA: Diagnosis not present

## 2016-05-14 DIAGNOSIS — R197 Diarrhea, unspecified: Secondary | ICD-10-CM | POA: Diagnosis present

## 2016-05-14 DIAGNOSIS — H4922 Sixth [abducent] nerve palsy, left eye: Secondary | ICD-10-CM | POA: Diagnosis present

## 2016-05-14 DIAGNOSIS — H532 Diplopia: Secondary | ICD-10-CM | POA: Diagnosis not present

## 2016-05-14 DIAGNOSIS — M6281 Muscle weakness (generalized): Secondary | ICD-10-CM | POA: Diagnosis not present

## 2016-05-14 DIAGNOSIS — Z9981 Dependence on supplemental oxygen: Secondary | ICD-10-CM | POA: Diagnosis not present

## 2016-05-14 DIAGNOSIS — E119 Type 2 diabetes mellitus without complications: Secondary | ICD-10-CM | POA: Diagnosis not present

## 2016-05-14 DIAGNOSIS — I613 Nontraumatic intracerebral hemorrhage in brain stem: Secondary | ICD-10-CM | POA: Diagnosis not present

## 2016-05-14 DIAGNOSIS — R29810 Facial weakness: Secondary | ICD-10-CM | POA: Diagnosis not present

## 2016-05-14 HISTORY — DX: Malignant (primary) neoplasm, unspecified: C80.1

## 2016-05-14 LAB — ETHANOL

## 2016-05-14 LAB — BASIC METABOLIC PANEL
Anion gap: 24 — ABNORMAL HIGH (ref 5–15)
BUN: 31 mg/dL — ABNORMAL HIGH (ref 6–20)
CO2: 21 mmol/L — ABNORMAL LOW (ref 22–32)
Calcium: 9 mg/dL (ref 8.9–10.3)
Chloride: 90 mmol/L — ABNORMAL LOW (ref 101–111)
Creatinine, Ser: 1.79 mg/dL — ABNORMAL HIGH (ref 0.44–1.00)
GFR calc Af Amer: 31 mL/min — ABNORMAL LOW (ref >60)
GFR calc non Af Amer: 27 mL/min — ABNORMAL LOW (ref >60)
Glucose, Bld: 551 mg/dL (ref 65–99)
Potassium: 5.6 mmol/L — ABNORMAL HIGH (ref 3.5–5.1)
Sodium: 135 mmol/L (ref 135–145)

## 2016-05-14 LAB — RAPID URINE DRUG SCREEN, HOSP PERFORMED
Amphetamines: NOT DETECTED
Barbiturates: NOT DETECTED
Benzodiazepines: POSITIVE — AB
COCAINE: NOT DETECTED
Opiates: NOT DETECTED
Tetrahydrocannabinol: NOT DETECTED

## 2016-05-14 LAB — COMPREHENSIVE METABOLIC PANEL
ALT: 13 U/L — ABNORMAL LOW (ref 14–54)
AST: 15 U/L (ref 15–41)
Albumin: 3.4 g/dL — ABNORMAL LOW (ref 3.5–5.0)
Alkaline Phosphatase: 85 U/L (ref 38–126)
Anion gap: 9 (ref 5–15)
BUN: 22 mg/dL — ABNORMAL HIGH (ref 6–20)
CHLORIDE: 89 mmol/L — AB (ref 101–111)
CO2: 35 mmol/L — ABNORMAL HIGH (ref 22–32)
CREATININE: 1.38 mg/dL — AB (ref 0.44–1.00)
Calcium: 9 mg/dL (ref 8.9–10.3)
GFR, EST AFRICAN AMERICAN: 42 mL/min — AB (ref >60)
GFR, EST NON AFRICAN AMERICAN: 36 mL/min — AB (ref >60)
Glucose, Bld: 406 mg/dL — ABNORMAL HIGH (ref 65–99)
POTASSIUM: 4.6 mmol/L (ref 3.5–5.1)
Sodium: 133 mmol/L — ABNORMAL LOW (ref 135–145)
Total Bilirubin: 0.8 mg/dL (ref 0.3–1.2)
Total Protein: 5.5 g/dL — ABNORMAL LOW (ref 6.5–8.1)

## 2016-05-14 LAB — PROTIME-INR
INR: 0.89
Prothrombin Time: 12 seconds (ref 11.4–15.2)

## 2016-05-14 LAB — URINALYSIS, ROUTINE W REFLEX MICROSCOPIC
BILIRUBIN URINE: NEGATIVE
Bacteria, UA: NONE SEEN
Hgb urine dipstick: NEGATIVE
KETONES UR: 5 mg/dL — AB
LEUKOCYTES UA: NEGATIVE
Nitrite: NEGATIVE
PROTEIN: 100 mg/dL — AB
RBC / HPF: NONE SEEN RBC/hpf (ref 0–5)
Specific Gravity, Urine: 1.012 (ref 1.005–1.030)
pH: 5 (ref 5.0–8.0)

## 2016-05-14 LAB — DIFFERENTIAL
Basophils Absolute: 0 10*3/uL (ref 0.0–0.1)
Basophils Relative: 0 %
EOS ABS: 0.2 10*3/uL (ref 0.0–0.7)
Eosinophils Relative: 2 %
Lymphocytes Relative: 18 %
Lymphs Abs: 1.5 10*3/uL (ref 0.7–4.0)
MONO ABS: 1 10*3/uL (ref 0.1–1.0)
MONOS PCT: 12 %
Neutro Abs: 5.4 10*3/uL (ref 1.7–7.7)
Neutrophils Relative %: 68 %

## 2016-05-14 LAB — APTT: APTT: 25 s (ref 24–36)

## 2016-05-14 LAB — I-STAT CHEM 8, ED
BUN: 24 mg/dL — ABNORMAL HIGH (ref 6–20)
CALCIUM ION: 1.07 mmol/L — AB (ref 1.15–1.40)
Chloride: 86 mmol/L — ABNORMAL LOW (ref 101–111)
Creatinine, Ser: 1.3 mg/dL — ABNORMAL HIGH (ref 0.44–1.00)
GLUCOSE: 393 mg/dL — AB (ref 65–99)
HCT: 27 % — ABNORMAL LOW (ref 36.0–46.0)
HEMOGLOBIN: 9.2 g/dL — AB (ref 12.0–15.0)
POTASSIUM: 4.5 mmol/L (ref 3.5–5.1)
Sodium: 130 mmol/L — ABNORMAL LOW (ref 135–145)
TCO2: 37 mmol/L (ref 0–100)

## 2016-05-14 LAB — CBC
HCT: 29.8 % — ABNORMAL LOW (ref 36.0–46.0)
Hemoglobin: 9.6 g/dL — ABNORMAL LOW (ref 12.0–15.0)
MCH: 28.7 pg (ref 26.0–34.0)
MCHC: 32.2 g/dL (ref 30.0–36.0)
MCV: 89.2 fL (ref 78.0–100.0)
Platelets: 220 10*3/uL (ref 150–400)
RBC: 3.34 MIL/uL — ABNORMAL LOW (ref 3.87–5.11)
RDW: 14 % (ref 11.5–15.5)
WBC: 8 10*3/uL (ref 4.0–10.5)

## 2016-05-14 LAB — I-STAT TROPONIN, ED: TROPONIN I, POC: 0.02 ng/mL (ref 0.00–0.08)

## 2016-05-14 LAB — CBG MONITORING, ED
Glucose-Capillary: 464 mg/dL — ABNORMAL HIGH (ref 65–99)
Glucose-Capillary: 538 mg/dL (ref 65–99)

## 2016-05-14 MED ORDER — ONDANSETRON HCL 4 MG/2ML IJ SOLN
4.0000 mg | Freq: Four times a day (QID) | INTRAMUSCULAR | Status: DC | PRN
Start: 2016-05-14 — End: 2016-05-23
  Administered 2016-05-14 – 2016-05-21 (×4): 4 mg via INTRAVENOUS
  Filled 2016-05-14 (×4): qty 2

## 2016-05-14 MED ORDER — INSULIN ASPART 100 UNIT/ML ~~LOC~~ SOLN: 15.0000 [IU] | Freq: Once | SUBCUTANEOUS | Status: DC

## 2016-05-14 MED ORDER — IOPAMIDOL (ISOVUE-370) INJECTION 76%
INTRAVENOUS | Status: AC
  Administered 2016-05-14: 50 mL
  Filled 2016-05-14: qty 50

## 2016-05-14 MED ORDER — METHYLPREDNISOLONE SODIUM SUCC 40 MG IJ SOLR
40.0000 mg | Freq: Every day | INTRAMUSCULAR | Status: DC
  Administered 2016-05-14 – 2016-05-15 (×2): 40 mg via INTRAVENOUS
  Filled 2016-05-14 (×2): qty 1

## 2016-05-14 MED ORDER — SODIUM CHLORIDE 0.9 % IV SOLN
INTRAVENOUS | Status: DC
  Administered 2016-05-14: 16:00:00 via INTRAVENOUS

## 2016-05-14 MED ORDER — SODIUM CHLORIDE 0.9 % IV SOLN
INTRAVENOUS | Status: DC
  Administered 2016-05-15: via INTRAVENOUS

## 2016-05-14 MED ORDER — SODIUM CHLORIDE 0.9 % IV SOLN
INTRAVENOUS | Status: DC
  Administered 2016-05-15: 01:00:00 via INTRAVENOUS

## 2016-05-14 MED ORDER — LORAZEPAM 2 MG/ML IJ SOLN
0.5000 mg | Freq: Once | INTRAMUSCULAR | Status: AC
  Administered 2016-05-14: 0.5 mg via INTRAVENOUS
  Filled 2016-05-14: qty 1

## 2016-05-14 MED ORDER — ALBUTEROL SULFATE (2.5 MG/3ML) 0.083% IN NEBU: 2.5000 mg | INHALATION_SOLUTION | RESPIRATORY_TRACT | Status: DC | PRN

## 2016-05-14 MED ORDER — LORAZEPAM 2 MG/ML IJ SOLN
1.0000 mg | Freq: Once | INTRAMUSCULAR | Status: AC
  Administered 2016-05-14: 1 mg via INTRAVENOUS
  Filled 2016-05-14: qty 1

## 2016-05-14 MED ORDER — HYDRALAZINE HCL 20 MG/ML IJ SOLN
10.0000 mg | INTRAMUSCULAR | Status: DC | PRN
  Administered 2016-05-14: 10 mg via INTRAVENOUS
  Filled 2016-05-14: qty 1

## 2016-05-14 MED ORDER — INSULIN ASPART 100 UNIT/ML ~~LOC~~ SOLN: 0.0000 [IU] | SUBCUTANEOUS | Status: DC

## 2016-05-14 MED ORDER — METOPROLOL TARTRATE 5 MG/5ML IV SOLN
2.5000 mg | Freq: Three times a day (TID) | INTRAVENOUS | Status: DC
  Administered 2016-05-14 – 2016-05-15 (×2): 2.5 mg via INTRAVENOUS
  Filled 2016-05-14 (×2): qty 5

## 2016-05-14 MED ORDER — DEXTROSE-NACL 5-0.45 % IV SOLN
INTRAVENOUS | Status: DC
  Administered 2016-05-15: 07:00:00 via INTRAVENOUS

## 2016-05-14 MED ORDER — METOPROLOL TARTRATE 5 MG/5ML IV SOLN
5.0000 mg | INTRAVENOUS | Status: DC
  Administered 2016-05-14: 5 mg via INTRAVENOUS
  Filled 2016-05-14: qty 5

## 2016-05-14 MED ORDER — HYDRALAZINE HCL 20 MG/ML IJ SOLN
10.0000 mg | INTRAMUSCULAR | Status: DC | PRN
  Administered 2016-05-17 – 2016-05-19 (×2): 10 mg via INTRAVENOUS
  Filled 2016-05-14 (×2): qty 1

## 2016-05-14 MED ORDER — SODIUM CHLORIDE 0.9 % IV SOLN
INTRAVENOUS | Status: DC
  Administered 2016-05-15: 5 [IU]/h via INTRAVENOUS
  Filled 2016-05-14: qty 2.5

## 2016-05-14 MED ORDER — INSULIN DETEMIR 100 UNIT/ML ~~LOC~~ SOLN
12.0000 [IU] | Freq: Every day | SUBCUTANEOUS | Status: DC
  Filled 2016-05-14 (×2): qty 0.12

## 2016-05-14 MED ORDER — LEVALBUTEROL TARTRATE 45 MCG/ACT IN AERO: 2.0000 | INHALATION_SPRAY | RESPIRATORY_TRACT | Status: DC | PRN

## 2016-05-14 NOTE — Consult Note (Signed)
Patient name: Marisa [name] MRN: [id number] DOB: 05/26/1940 Sex: female  REASON FOR CONSULT: Type B aortic dissection. Consult is from the emergency department.  HPI: Marisa [name] is a 76 y.o. female, who was in her normal state of health until 9:30 this morning when her husband noted that when she was walking she was following to the left side. She also noted some blurring in the vision of her right eye. She got an upper earlier in the morning at 6:30 AM and was completely normal. She denies any chest pain or back pain. She denies any focal weakness or paresthesias in her upper extremities or lower extremities. There was no expressive or receptive aphasia or amaurosis fugax. She presented to the emergency department and her workup included a CT angiogram of the neck. An incidental finding was evidence of a type B aortic dissection although only a small segment of the thoracic aorta was visualized. Vascular surgery was consult.  Currently the patient denies any history of weakness or paresthesias. She denies chest pain or back pain. She is unaware of any history of an aortic dissection in the past.  Past Medical History:  Diagnosis Date  . Anemia   . Arthritis   . Asthma    Home 3L O2   . Chronic anemia   . Depression    patient feels depressed at the end of the month  . Diabetes mellitus type 2, insulin dependent ([place])    initial diagnoses 11/2008  . Diabetes mellitus without complication ([place])   . Diabetic ketoacidosis ([place]) 11/2010  . Diverticulosis   . Esophageal stricture 06/2004   Dilation 06/2004  . Family history of anesthesia complication    DAUGHTER HAS NAUSEA  . GERD (gastroesophageal reflux disease)   . history of  pericarditis 12/2002  . History of blood transfusion   . Hypertension   . Myocardial infarction    Chest pain s/p normal cath in 08/2004 then NSTEMI during 11/2010 admission, negative Myoview  . Osteoarthritis   . Oxygen deficiency   . Pericardial  effusion   . Rectal bleeding 11/2010   Large hemorrhoids  . Rheumatoid arteritis   . Rheumatoid arthritis(714.0)    On MTX and chronic steroids  . Seizure ([place]) 11/2008  . Supraventricular tachycardia ([facility])     Family History  Problem Relation Age of Onset  . Heart disease Mother   . Stroke Father   . Dementia Neg Hx     SOCIAL HISTORY: She is married. She has 2 children. She does not use tobacco. Social History   Social History  . Marital status: Married    Spouse name: Marisa [name]  . Number of children: 2  . Years of education: 12+   Occupational History  . Retired    Social History Main Topics  . Smoking status: Never Smoker  . Smokeless tobacco: Never Used  . Alcohol use No  . Drug use: No  . Sexual activity: Not on file   Other Topics Concern  . Not on file   Social History Narrative   ** Merged History Encounter **       Pt lives at home with her husband and has a son and daughter. She is still ambulatory occasionally using a cane or walker. No toxic habits.    Caffeine use: 1 cup decaf daily       Allergies  Allergen Reactions  . Demerol [Meperidine] Other (See Comments)    Hallucinations  . Penicillins Swelling  and Other (See Comments)    Throat swelling Has patient had a PCN reaction causing immediate rash, facial/tongue/throat swelling, SOB or lightheadedness with hypotension: Yes Has patient had a PCN reaction causing severe rash involving mucus membranes or skin necrosis: No Has patient had a PCN reaction that required hospitalization No Has patient had a PCN reaction occurring within the last 10 years: No If all of the above answers are "NO", then may proceed with Cephalosporin use.   . Enbrel [Etanercept] Swelling    Arm swelling   . Humira [Adalimumab] Swelling    Arm swelling     Current Facility-Administered Medications  Medication Dose Route Frequency Provider Last Rate Last Dose  . 0.9 %  sodium chloride infusion   Intravenous  Continuous Samella Parr, NP 100 mL/hr at 05/14/16 1535    . hydrALAZINE (APRESOLINE) injection 10 mg  10 mg Intravenous Q4H PRN Samella Parr, NP      . insulin aspart (novoLOG) injection 0-9 Units  0-9 Units Subcutaneous Q4H Samella Parr, NP      . insulin detemir (LEVEMIR) injection 12 Units  12 Units Subcutaneous Daily Samella Parr, NP      . levalbuterol [facility] HFA) inhaler 2 puff  2 puff Inhalation Q4H PRN Samella Parr, NP      . methylPREDNISolone sodium succinate (SOLU-MEDROL) 40 mg/mL injection 40 mg  40 mg Intravenous Daily Samella Parr, NP   40 mg at 05/14/16 1535  . metoprolol (LOPRESSOR) injection 2.5 mg  2.5 mg Intravenous Q8H Samella Parr, NP       Current Outpatient Prescriptions  Medication Sig Dispense Refill  . AMBULATORY NON FORMULARY MEDICATION Continuous O2 @ 2.5-3 LMP    . Cholecalciferol (VITAMIN D PO) Take 1 tablet by mouth daily. 1000 units    . citalopram (CELEXA) 10 MG tablet Take 10 mg by mouth daily.    . Coenzyme Q10 (CO Q 10) 100 MG CAPS Take 100 mg by mouth daily.    . diphenhydrAMINE (BENADRYL) 25 MG tablet Take 25 mg by mouth every 8 (eight) hours as needed for allergies.     Marland Kitchen docusate sodium (COLACE) 100 MG capsule Take 100 mg by mouth.     . donepezil (ARICEPT) 10 MG tablet Take 1 tablet (10 mg total) by mouth at bedtime. 30 tablet 11  . fluticasone (FLONASE) 50 MCG/ACT nasal spray Place 2 sprays into both nostrils daily as needed for allergies or rhinitis.    . Glucosamine-Fish Oil-EPA-DHA (GLUCOSAMINE & FISH OIL PO) Take 1,000 mg by mouth 2 (two) times daily.     . insulin aspart (NOVOLOG) 100 UNIT/ML injection 0-9 Units, Subcutaneous, 3 times daily with meals CBG < 70: implement hypoglycemia protocol CBG 70 - 120: 0 units CBG 121 - 150: 1 unit CBG 151 - 200: 2 units CBG 201 - 250: 3 units CBG 251 - 300: 5 units CBG 301 - 350: 7 units CBG 351 - 400: 9 units CBG > 400: call MD 10 mL 11  . insulin detemir (LEVEMIR) 100 UNIT/ML  injection Inject 16 Units into the skin. Give at [facility]     . ipratropium-albuterol (DUONEB) 0.5-2.5 (3) MG/3ML SOLN Take 3 mLs by nebulization every 6 (six) hours as needed.    . leflunomide (ARAVA) 20 MG tablet Take 20 mg by mouth daily.     Marland Kitchen levalbuterol (XOPENEX HFA) 45 MCG/ACT inhaler Inhale 2 puffs into the lungs every 4 (four) hours as needed for  wheezing (wheezing).     . LORazepam (ATIVAN) 1 MG tablet Take 0.5 tablets (0.5 mg total) by mouth every 4 (four) hours as needed for anxiety. 30 tablet 0  . Menthol, Topical Analgesic, (BIOFREEZE) 4 % GEL Apply 1 application topically daily. To bilateral knees and mid back    . metoprolol succinate (TOPROL-XL) 100 MG 24 hr tablet Take 100 mg by mouth daily. Take with or immediately following a meal.     . Multiple Vitamin (MULTIVITAMINS PO) Take 1 tablet by mouth. Daily.     . Nutritional Supplements (ESTROVEN PO) Take 1 capsule by mouth daily.    Vladimir Faster Glycol-Propyl Glycol (SYSTANE OP) Apply 1 drop to eye daily as needed (dry eyes).    . predniSONE (DELTASONE) 5 MG tablet Take 10 mg by mouth daily. Take 10 mg po qd    . pregabalin (LYRICA) 75 MG capsule Take 1 capsule (75 mg total) by mouth 3 (three) times daily. 270 capsule 1  . promethazine (PHENERGAN) 25 MG tablet Take 1 tablet by mouth every 8 (eight) hours as needed for vomiting.   1  . ranitidine (ZANTAC) 150 MG tablet Take 150 mg by mouth 2 (two) times daily.     . risperiDONE (RISPERDAL) 0.5 MG tablet Take 0.5 tablets (0.25 mg total) by mouth 2 (two) times daily. (Patient taking differently: Take 0.5 mg by mouth 2 (two) times daily. ) 30 tablet 0  . traMADol (ULTRAM) 50 MG tablet Take 1 tablet (50 mg total) by mouth 3 (three) times daily as needed for moderate pain. 30 tablet 0  . vitamin C (ASCORBIC ACID) 500 MG tablet Take 500 mg by mouth 2 (two) times daily.     Marland Kitchen VITAMIN K, PHYTONADIONE, PO Take 100 mcg by mouth daily. Supplement    . XIIDRA 5 % SOLN Place 1 drop into both eyes 2  (two) times daily as needed (dry eyes).   12  . dicyclomine (BENTYL) 10 MG/5ML syrup Take 10 mg by mouth 2 (two) times daily.    Marland Kitchen estrogens, conjugated, (PREMARIN) 0.625 MG tablet Take 0.625 mg by mouth daily.     . Fe Fum-FePoly-Vit C-Vit B3 (INTEGRA PO) Take 1 capsule by mouth 2 (two) times daily. Supplement    . insulin lispro (HUMALOG) 100 UNIT/ML injection Inject 5 Units into the skin at bedtime. For CBG >/= to 200    . insulin lispro (HUMALOG) 100 UNIT/ML injection Inject 5 Units into the skin 3 (three) times daily before meals. For CBG >/= 200 (in addition to sliding scale orders)    . shark liver oil-cocoa butter (PREPARATION H) 0.25-3-85.5 % suppository Place 1 suppository rectally 2 (two) times daily. Take 1 suppository BID x1 week, ending 02/17/16, then decrease to once qd for hemorrhoids      REVIEW OF SYSTEMS:  [X]  denotes positive finding, [ ]  denotes negative finding Cardiac  Comments:  Chest pain or chest pressure:    Shortness of breath upon exertion: X   Short of breath when lying flat: X   Irregular heart rhythm:        Vascular    Pain in calf, thigh, or hip brought on by ambulation:    Pain in feet at night that wakes you up from your sleep:     Blood clot in your veins:    Leg swelling:         Pulmonary    Oxygen at home:    Productive cough:  Wheezing:         Neurologic    Sudden weakness in arms or legs:     Sudden numbness in arms or legs:     Sudden onset of difficulty speaking or slurred speech:    Temporary loss of vision in one eye:     Problems with dizziness:         Gastrointestinal    Blood in stool:     Vomited blood:         Genitourinary    Burning when urinating:     Blood in urine:        Psychiatric    Major depression:         Hematologic    Bleeding problems:    Problems with blood clotting too easily:        Skin    Rashes or ulcers:        Constitutional    Fever or chills:      PHYSICAL EXAM: Vitals:    05/14/16 1500 05/14/16 1530 05/14/16 1545 05/14/16 1600  BP: 125/66 138/99 135/95 142/96  Pulse: 89 95 92 89  Resp: 16 26 22 18   SpO2: 100% 100% 100% 100%  Weight:      Height:        GENERAL: The patient is a well-nourished female, in no acute distress. The vital signs are documented above. CARDIAC: There is a regular rate and rhythm.  VASCULAR: I do not detect carotid bruits. She has palpable radial pulses bilaterally. She has palpable femoral, popliteal, and dorsalis pedis pulses bilaterally. I cannot palpate posterior tibial pulses. Both feet are warm and well-perfused. She has no significant lower extremity swelling. PULMONARY: There is good air exchange bilaterally without wheezing or rales. ABDOMEN: Soft and non-tender with normal pitched bowel sounds.  MUSCULOSKELETAL: There are no major deformities or cyanosis. NEUROLOGIC: No focal weakness or paresthesias are detected. SKIN: There are no ulcers or rashes noted. PSYCHIATRIC: The patient has a normal affect.  DATA:   CT ANGIOGRAM NECK: I reviewed the CT angiogram of her neck. There is no significant extracranial carotid disease. There is some mild intracranial disease in the right internal carotid artery. 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 This is most likely related to remote trauma. The very proximal aspect of the ascending thoracic aorta is visualized and there appears to be a dissection at this level. There is no inflammation around this to suggest that this is acute and I suspect this is a chronic dissection.  CT HEAD: I reviewed her CT of the head which shows no acute intracranial abnormalities. 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    Creatinine is 1.3. GFR is 36.  MEDICAL ISSUES:  TYPE B AORTIC DISSECTION: I have recommended that the patient get a formal CT scan of the chest to further evaluate her dissection. I suspect that this is chronic. There is no evidence  that this is contributing to her presenting symptoms. WIll follow up after her CT scan is done.   Deitra Mayo [facility] [phone]

## 2016-05-14 NOTE — ED Notes (Signed)
Pt stated she had to use the bathroom. I put PT on bedpan and when asked if finished PT stated she did not have to use bathroom.

## 2016-05-14 NOTE — ED Notes (Signed)
PT went to CT  

## 2016-05-14 NOTE — ED Triage Notes (Signed)
Pt brought in by EMS due tot having blurry vision in right eye, abnormal gait, and leaning to the left. Pt is a&ox2. LSN was 7p.m. last night. Pt wear 2.5L of O2 at home.

## 2016-05-14 NOTE — ED Provider Notes (Signed)
Marisa [name] DEPT Provider Note   CSN: [id number] Arrival date & time:        History   Chief Complaint No chief complaint on file.   HPI Marisa [name] is a 76 y.o. female.  HPI  Possible with new vision abnormalities last night. This morning at 0630 they were worse but could walk. At 0930, husband statesThat the patient cannot stand and Bleeding to the left side with left-sided weakness. This time they called EMS. No recent illnesses. No history of stroke. No history of similar symptoms. Wears corrective lenses but no other ophthalmologic history.  Past Medical History:  Diagnosis Date  . Anemia   . Arthritis   . Asthma    Home 3L O2   . Chronic anemia   . Depression    patient feels depressed at the end of the month  . Diabetes mellitus type 2, insulin dependent ([place])    initial diagnoses 11/2008  . Diabetes mellitus without complication ([place])   . Diabetic ketoacidosis ([place]) 11/2010  . Diverticulosis   . Esophageal stricture 06/2004   Dilation 06/2004  . Family history of anesthesia complication    DAUGHTER HAS NAUSEA  . GERD (gastroesophageal reflux disease)   . history of  pericarditis 12/2002  . History of blood transfusion   . Hypertension   . Myocardial infarction    Chest pain s/p normal cath in 08/2004 then NSTEMI during 11/2010 admission, negative Myoview  . Osteoarthritis   . Oxygen deficiency   . Pericardial effusion   . Rectal bleeding 11/2010   Large hemorrhoids  . Rheumatoid arteritis   . Rheumatoid arthritis(714.0)    On MTX and chronic steroids  . Seizure ([place]) 11/2008  . Supraventricular tachycardia [facility])     Patient Active Problem List   Diagnosis Date Noted  . Multiple rib fractures 01/18/2016  . Rib fractures 01/18/2016  . Chronic renal insufficiency, stage 3 (moderate)   . Fall from slip, trip, or stumble, initial encounter   . Memory loss 05/09/2015  . Immunosuppressed status ([place])   . Joint pain   . RA (rheumatoid arthritis) ([place])    . Joint effusion 05/28/2014  . Shoulder effusion 05/28/2014  . Rheumatoid arthritis ([place]) 05/28/2014  . UTI (urinary tract infection) 05/28/2014  . Diabetes mellitus type 2, insulin dependent ([place]) 05/28/2014  . COPD exacerbation ([place]) 05/28/2014  . Symptomatic anemia 04/13/2014  . Diabetes mellitus type 2, controlled ([place]) 04/13/2014  . Rheumatoid arthritis ([place]) 04/13/2014  . Essential hypertension 04/13/2014  . Hypoglycemia 01/04/2014  . Acute encephalopathy 01/04/2014  . Iron deficiency anemia due to chronic blood loss 03/07/2013  . Chronic respiratory failure ([place]) 02/11/2013  . Dyspnea 02/11/2013  . 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 Acute blood loss anemia 02/10/2013  . Gastroenteritis 02/10/2013  . Blood in stool 01/05/2013  . Personal history of colonic polyps 01/05/2013  . Anemia 01/04/2013  . Iron deficiency 02/26/2012  . SOB (shortness of breath) 02/26/2012  . Hyponatremia 02/26/2012  . AKI (acute kidney injury) ([place]) 02/26/2012  . MRSA cellulitis 10/08/2011  . Abscess of buttock, right 10/08/2011  . Sinus tachycardia 10/08/2011  . Protein calorie malnutrition ([place]) 10/08/2011  . SIRS (systemic inflammatory response syndrome) ([place]) 09/29/2011  . Tachycardia 09/29/2011  . Diabetes mellitus type 2, insulin dependent ([place])   . Chronic anemia   . NEOPLASM, MALIGNANT, CARCINOMA, BASAL CELL, NOSE 06/08/2007  . POLYP, COLON 06/08/2007  . HYPOKALEMIA 06/08/2007  . Anemia, normocytic normochromic 06/08/2007  . Anxiety state 06/08/2007  .  HYPERTENSION 06/08/2007  . UNSPECIFIED ACUTE PERICARDITIS 06/08/2007  . SINUS ARRHYTHMIA 06/08/2007  . HEMORRHOIDS, INTERNAL 06/08/2007  . ALLERGIC RHINITIS 06/08/2007  . BRONCHITIS 06/08/2007  . ASTHMA 06/08/2007  . COPD 06/08/2007  . ESOPHAGEAL STRICTURE 06/08/2007  . GERD 06/08/2007  . GASTRITIS 06/08/2007  . CONSTIPATION, CHRONIC 06/08/2007  . IRRITABLE BOWEL SYNDROME 06/08/2007  . Rheumatoid arthritis(714.0) 06/08/2007  .  SLEEP APNEA 06/08/2007  . HEADACHE, CHRONIC 06/08/2007  . NEOPLASM, MALIGNANT, CARCINOMA, BASAL CELL, NOSE 06/08/2007    Past Surgical History:  Procedure Laterality Date  . ABDOMINAL HYSTERECTOMY  1980  . CHOLECYSTECTOMY    . COLONOSCOPY N/A 01/06/2013   Procedure: COLONOSCOPY;  Surgeon: Ladene Artist, MD;  Location: [facility] ENDOSCOPY;  Service: Endoscopy;  Laterality: N/A;  . ESOPHAGOGASTRODUODENOSCOPY N/A 02/11/2013   Procedure: ESOPHAGOGASTRODUODENOSCOPY (EGD);  Surgeon: Irene Shipper, MD;  Location: [facility] ENDOSCOPY;  Service: Endoscopy;  Laterality: N/A;  . INCISE AND DRAIN ABCESS  09/2011   I&D of peri-rectal abcess per Dr Zella Richer.     OB History    Gravida Para Term Preterm AB Living   0 0 0 0 0     SAB TAB Ectopic Multiple Live Births   0 0 0           Home Medications    Prior to Admission medications   Medication Sig Start Date End Date Taking? Authorizing Provider  AMBULATORY NON FORMULARY MEDICATION Continuous O2 @ 2.5-3 LMP    Historical Provider, MD  BD PEN NEEDLE NANO U/F 32G X 4 MM MISC  10/13/14   Historical Provider, MD  Cholecalciferol (VITAMIN D PO) Take 1 tablet by mouth daily. 1000 units    Historical Provider, MD  Coenzyme Q10 (CO Q 10) 100 MG CAPS Take 100 mg by mouth daily.    Historical Provider, MD  dicyclomine (BENTYL) 10 MG/5ML syrup Take 10 mg by mouth 2 (two) times daily.    Historical Provider, MD  diphenhydrAMINE (BENADRYL) 25 MG tablet Take 25 mg by mouth every 8 (eight) hours as needed for allergies.     Historical Provider, MD  docusate sodium (COLACE) 100 MG capsule Take 100 mg by mouth.     Historical Provider, MD  donepezil (ARICEPT) 10 MG tablet Take 1 tablet (10 mg total) by mouth at bedtime. 05/09/15   Melvenia Beam, MD  estrogens, conjugated, (PREMARIN) 0.625 MG tablet Take 0.625 mg by mouth daily.     Historical Provider, MD  Fe Fum-FePoly-Vit C-Vit B3 (INTEGRA PO) Take 1 capsule by mouth 2 (two) times daily. Supplement    Historical  Provider, MD  fluticasone (FLONASE) 50 MCG/ACT nasal spray Place 2 sprays into both nostrils daily as needed for allergies or rhinitis.    Historical Provider, MD  FREESTYLE LITE test strip TEST 10 TIMES DAILY ICD E11.65 09/16/14   Historical Provider, MD  Glucosamine-Fish Oil-EPA-DHA (GLUCOSAMINE & FISH OIL PO) Take 1,000 mg by mouth 2 (two) times daily.     Historical Provider, MD  insulin aspart (NOVOLOG) 100 UNIT/ML injection 0-9 Units, Subcutaneous, 3 times daily with meals CBG < 70: implement hypoglycemia protocol CBG 70 - 120: 0 units CBG 121 - 150: 1 unit CBG 151 - 200: 2 units CBG 201 - 250: 3 units CBG 251 - 300: 5 units CBG 301 - 350: 7 units CBG 351 - 400: 9 units CBG > 400: call MD 01/11/14   Jonetta Osgood, MD  insulin detemir (LEVEMIR) 100 UNIT/ML injection Inject 12 Units into  the skin. Give at [place] Provider, MD  insulin lispro (HUMALOG) 100 UNIT/ML injection Inject 5 Units into the skin at bedtime. For CBG >/= to 200    Historical Provider, MD  insulin lispro (HUMALOG) 100 UNIT/ML injection Inject 5 Units into the skin 3 (three) times daily before meals. For CBG >/= 200 (in addition to sliding scale orders)    Historical Provider, MD  ipratropium-albuterol (DUONEB) 0.5-2.5 (3) MG/3ML SOLN Take 3 mLs by nebulization every 6 (six) hours as needed.    Historical Provider, MD  leflunomide (ARAVA) 20 MG tablet Take 20 mg by mouth daily.     Historical Provider, MD  levalbuterol [facility] HFA) 45 MCG/ACT inhaler Inhale 2 puffs into the lungs every 4 (four) hours as needed for wheezing (wheezing).     Historical Provider, MD  LORazepam (ATIVAN) 1 MG tablet Take 0.5 tablets (0.5 mg total) by mouth every 4 (four) hours as needed for anxiety. 01/22/16   Costin Karlyne Greenspan, MD  Menthol, Topical Analgesic, (BIOFREEZE) 4 % GEL Apply 1 application topically daily. To bilateral knees and mid back    Historical Provider, MD  metoprolol succinate (TOPROL-XL) 100 MG 24 hr tablet Take  100 mg by mouth daily. Take with or immediately following a meal.     Historical Provider, MD  Multiple Vitamin (MULTIVITAMINS PO) Take 1 tablet by mouth. Daily.     Historical Provider, MD  Polyethyl Glycol-Propyl Glycol (SYSTANE OP) Apply 1 drop to eye daily as needed (dry eyes).    Historical Provider, MD  predniSONE (DELTASONE) 5 MG tablet Take 10 mg by mouth daily. Take 10 mg po qd 12/25/15   Historical Provider, MD  pregabalin (LYRICA) 75 MG capsule Take 1 capsule (75 mg total) by mouth 3 (three) times daily. 03/28/16   Melvenia Beam, MD  promethazine (PHENERGAN) 25 MG tablet Take 1 tablet by mouth every 8 (eight) hours as needed for vomiting.  07/31/15   Historical Provider, MD  ranitidine (ZANTAC) 150 MG tablet Take 150 mg by mouth 2 (two) times daily.     Historical Provider, MD  risperiDONE (RISPERDAL) 0.5 MG tablet Take 0.5 tablets (0.25 mg total) by mouth 2 (two) times daily. 01/11/14   Shanker Kristeen Mans, MD  shark liver oil-cocoa butter (PREPARATION H) 0.25-3-85.5 % suppository Place 1 suppository rectally 2 (two) times daily. Take 1 suppository BID x1 week, ending 02/17/16, then decrease to once qd for hemorrhoids 02/10/16   Historical Provider, MD  traMADol (ULTRAM) 50 MG tablet Take 1 tablet (50 mg total) by mouth 3 (three) times daily as needed for moderate pain. 01/22/16   Costin Karlyne Greenspan, MD  vitamin C (ASCORBIC ACID) 500 MG tablet Take 500 mg by mouth 2 (two) times daily.     Historical Provider, MD  VITAMIN K, PHYTONADIONE, PO Take 100 mcg by mouth daily. Supplement    Historical Provider, MD  XIIDRA 5 % SOLN Place 1 drop into both eyes 2 (two) times daily as needed (dry eyes).  12/02/15   Historical Provider, MD    Family History Family History  Problem Relation Age of Onset  . Heart disease Mother   . Stroke Father   . Dementia Neg Hx     Social History Social History  Substance Use Topics  . Smoking status: Never Smoker  . Smokeless tobacco: Never Used  . Alcohol use  No     Allergies   Demerol [meperidine]; Penicillins; Enbrel [etanercept]; and Humira [  adalimumab]   Review of Systems Review of Systems  All other systems reviewed and are negative.    Physical Exam Updated Vital Signs There were no vitals taken for this visit.  Physical Exam  Constitutional: She appears well-developed and well-nourished.  HENT:  Head: Normocephalic and atraumatic.  Eyes: Conjunctivae are normal.  Neck: Normal range of motion.  Cardiovascular: Normal rate and regular rhythm.   Pulmonary/Chest: No stridor. No respiratory distress.  Abdominal: Soft. She exhibits no distension. There is no tenderness.  Neurological: She is alert. A cranial nerve deficit (left facial droop at rest) is present.  Decreased strength on left grip and arm flexion/extension Profound eye deviation. Each eye has intact vision and visual fields but significant strabismus Lower extremities with equal strength and sensation to light touch  Skin: Skin is warm and dry.  Nursing note and vitals reviewed.    ED Treatments / Results  Labs (all labs ordered are listed, but only abnormal results are displayed) Labs Reviewed  ETHANOL  PROTIME-INR  APTT  CBC  DIFFERENTIAL  COMPREHENSIVE METABOLIC PANEL  RAPID URINE DRUG SCREEN, HOSP PERFORMED  URINALYSIS, ROUTINE W REFLEX MICROSCOPIC  I-STAT CHEM 8, ED  I-STAT TROPOININ, ED    EKG  EKG Interpretation  Date/Time:  Tuesday May 14 2016 11:38:38 EST Ventricular Rate:  87 PR Interval:    QRS Duration: 85 QT Interval:  347 QTC Calculation: 418 R Axis:   70 Text Interpretation:  Sinus rhythm LVH with secondary repolarization abnormality Anterior infarct, old Artifact in lead(s) I II III aVR aVL aVF When compared with ECG of 04/08/2015, No significant change was found Confirmed by [facility]  MD, DAVID ([id number]) on 05/14/2016 11:41:55 PM       Radiology No results found.  Procedures Procedures (including critical care  time)  Medications Ordered in ED Medications - No data to display   Initial Impression / Assessment and Plan / ED Course  I have reviewed the triage vital signs and the nursing notes.  Pertinent labs & imaging results that were available during my care of the patient were reviewed by me and considered in my medical decision making (see chart for details).      1120: spoke with husband and patient's dysconjugate gaze is new. Also with new left side weakness. He said she was able to ambulate to bathroom at 0630 but at 0930 is when difficulty started. Leaning to left and and off balance, not able to stand. At this point she is 5 hours out from [facility] so will discuss with neurology about possible interventions/code stroke/etc.   Neurology suggested not calling a code stroke secondary to unclear last normal period did recommend getting a CT CTA.  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 I discussed with vascular surgery, Carlean Jews, who agreed to see the patient and asked to get a CT chest with contrast to further elucidate the extent of the dissection.  On review of patient's labs she has a GFR of 36 and she just got contrast for the CTA of her head and neck. I talked to the radiologist who felt that it would be high risk for the patient's kidneys to give a large bolus of contrast again. We'll plan for fluid hydration overnight check labs and warning x-ray with her the same and consider CT chest with contrast that  time to evaluate the aorta.  I discussed this with medicine who will admit to SDU.    Final Clinical Impressions(s) / ED Diagnoses   Final diagnoses:  Confusion  Cerebrovascular accident (CVA), unspecified mechanism ([place])     Merrily Pew, MD 05/19/16 1201

## 2016-05-14 NOTE — ED Notes (Signed)
Took PT off bedpan. PT did not urinate.

## 2016-05-14 NOTE — ED Notes (Signed)
Assisted [place], [place] with readjusting patient on stretcher; patient resting now; no needs at this time

## 2016-05-14 NOTE — ED Notes (Signed)
Spoke with K. Schorr about pt bg >500, nausea, and anxiety. Received orders for stat BMP, ativan, and zofran and to notify her once bmet resulted. No insulin given at this time.

## 2016-05-14 NOTE — ED Notes (Signed)
Pt is complaining of nausea. Pt is asking for nausea med and H20.

## 2016-05-14 NOTE — ED Notes (Signed)
Marisa [name] stated she had to use the bathroom. I put Marisa [name] on bedpan and Marisa [name] did not urinate or pass bowls.

## 2016-05-14 NOTE — ED Notes (Signed)
Neurologist at bedside. 

## 2016-05-14 NOTE — Consult Note (Signed)
Requesting Physician: Dr. Dayna Barker    Chief Complaint: Stroke  History obtained from:  Patient     HPI:                                                                                                                                         Marisa [name] is an 76 y.o. female as known dementia and lives with her husband. Apparently she was at baseline yesterday and went to sleep around 7:00. Per husband around 12:00 midnight patient woke up and noted that her vision was off. At 6 AM patient got up and to the bathroom but the husband didn't really look at her very closely all he could tell is that she was able to walk to the bathroom. She went back to bed and woke up around 9 and at this time noted that she was unable to walk by herself. Patient was brought to the emergency department where she was noted to have disconjugate gaze and limited extraocular movements. In addition she seemed weaker on the left. On examination patient has limited upward gaze bilaterally, left sixth nerve palsy, ability to immediately deviate her left eye and apparently no lateral gaze with her right eye. Face appears symmetrical but she does appear to have left-sided weakness. There is concern for possible basilar thrombosis. A stat CTA of head and neck were obtained.  Date last known well: Date: 05/13/2016 Time last known well: Time: 18:00 tPA Given: No: out of window  Past Medical History:  Diagnosis Date  . Anemia   . Arthritis   . Asthma    Home 3L O2   . Chronic anemia   . Depression    patient feels depressed at the end of the month  . Diabetes mellitus type 2, insulin dependent ([place])    initial diagnoses 11/2008  . Diabetes mellitus without complication ([place])   . Diabetic ketoacidosis ([place]) 11/2010  . Diverticulosis   . Esophageal stricture 06/2004   Dilation 06/2004  . Family history of anesthesia complication    DAUGHTER HAS NAUSEA  . GERD (gastroesophageal reflux disease)   . history of  pericarditis  12/2002  . History of blood transfusion   . Hypertension   . Myocardial infarction    Chest pain s/p normal cath in 08/2004 then NSTEMI during 11/2010 admission, negative Myoview  . Osteoarthritis   . Oxygen deficiency   . Pericardial effusion   . Rectal bleeding 11/2010   Large hemorrhoids  . Rheumatoid arteritis   . Rheumatoid arthritis(714.0)    On MTX and chronic steroids  . Seizure ([place]) 11/2008  . Supraventricular tachycardia [facility])     Past Surgical History:  Procedure Laterality Date  . ABDOMINAL HYSTERECTOMY  1980  . CHOLECYSTECTOMY    . COLONOSCOPY N/A 01/06/2013   Procedure: COLONOSCOPY;  Surgeon: Ladene Artist, MD;  Location: [facility] ENDOSCOPY;  Service: Endoscopy;  Laterality: N/A;  . ESOPHAGOGASTRODUODENOSCOPY N/A 02/11/2013   Procedure: ESOPHAGOGASTRODUODENOSCOPY (EGD);  Surgeon: Irene Shipper, MD;  Location: [facility] ENDOSCOPY;  Service: Endoscopy;  Laterality: N/A;  . INCISE AND DRAIN ABCESS  09/2011   I&D of peri-rectal abcess per Dr Zella Richer.     Family History  Problem Relation Age of Onset  . Heart disease Mother   . Stroke Father   . Dementia Neg Hx    Social History:  reports that she has never smoked. She has never used smokeless tobacco. She reports that she does not drink alcohol or use drugs.  Allergies:  Allergies  Allergen Reactions  . Demerol [Meperidine] Other (See Comments)    Hallucinations  . Penicillins Swelling and Other (See Comments)    Throat swelling Has patient had a PCN reaction causing immediate rash, facial/tongue/throat swelling, SOB or lightheadedness with hypotension: Yes Has patient had a PCN reaction causing severe rash involving mucus membranes or skin necrosis: No Has patient had a PCN reaction that required hospitalization No Has patient had a PCN reaction occurring within the last 10 years: No If all of the above answers are "NO", then may proceed with Cephalosporin use.   . Enbrel [Etanercept] Swelling    Arm swelling   .  Humira [Adalimumab] Swelling    Arm swelling     Medications:                                                                                                                           No current facility-administered medications for this encounter.    Current Outpatient Prescriptions  Medication Sig Dispense Refill  . AMBULATORY NON FORMULARY MEDICATION Continuous O2 @ 2.5-3 LMP    . BD PEN NEEDLE NANO U/F 32G X 4 MM MISC     . Cholecalciferol (VITAMIN D PO) Take 1 tablet by mouth daily. 1000 units    . Coenzyme Q10 (CO Q 10) 100 MG CAPS Take 100 mg by mouth daily.    Marland Kitchen dicyclomine (BENTYL) 10 MG/5ML syrup Take 10 mg by mouth 2 (two) times daily.    . diphenhydrAMINE (BENADRYL) 25 MG tablet Take 25 mg by mouth every 8 (eight) hours as needed for allergies.     Marland Kitchen docusate sodium (COLACE) 100 MG capsule Take 100 mg by mouth.     . donepezil (ARICEPT) 10 MG tablet Take 1 tablet (10 mg total) by mouth at bedtime. 30 tablet 11  . estrogens, conjugated, (PREMARIN) 0.625 MG tablet Take 0.625 mg by mouth daily.     . Fe Fum-FePoly-Vit C-Vit B3 (INTEGRA PO) Take 1 capsule by mouth 2 (two) times daily. Supplement    . fluticasone (FLONASE) 50 MCG/ACT nasal spray Place 2 sprays into both nostrils daily as needed for allergies or rhinitis.    Marland Kitchen FREESTYLE LITE test strip TEST 10 TIMES DAILY ICD E11.65  10  . Glucosamine-Fish Oil-EPA-DHA (GLUCOSAMINE & FISH OIL PO) Take 1,000 mg by mouth 2 (two)  times daily.     . insulin aspart (NOVOLOG) 100 UNIT/ML injection 0-9 Units, Subcutaneous, 3 times daily with meals CBG < 70: implement hypoglycemia protocol CBG 70 - 120: 0 units CBG 121 - 150: 1 unit CBG 151 - 200: 2 units CBG 201 - 250: 3 units CBG 251 - 300: 5 units CBG 301 - 350: 7 units CBG 351 - 400: 9 units CBG > 400: call MD 10 mL 11  . insulin detemir (LEVEMIR) 100 UNIT/ML injection Inject 12 Units into the skin. Give at [facility]     . insulin lispro (HUMALOG) 100 UNIT/ML injection Inject 5 Units  into the skin at bedtime. For CBG >/= to 200    . insulin lispro (HUMALOG) 100 UNIT/ML injection Inject 5 Units into the skin 3 (three) times daily before meals. For CBG >/= 200 (in addition to sliding scale orders)    . ipratropium-albuterol (DUONEB) 0.5-2.5 (3) MG/3ML SOLN Take 3 mLs by nebulization every 6 (six) hours as needed.    . leflunomide (ARAVA) 20 MG tablet Take 20 mg by mouth daily.     Marland Kitchen levalbuterol (XOPENEX HFA) 45 MCG/ACT inhaler Inhale 2 puffs into the lungs every 4 (four) hours as needed for wheezing (wheezing).     . LORazepam (ATIVAN) 1 MG tablet Take 0.5 tablets (0.5 mg total) by mouth every 4 (four) hours as needed for anxiety. 30 tablet 0  . Menthol, Topical Analgesic, (BIOFREEZE) 4 % GEL Apply 1 application topically daily. To bilateral knees and mid back    . metoprolol succinate (TOPROL-XL) 100 MG 24 hr tablet Take 100 mg by mouth daily. Take with or immediately following a meal.     . Multiple Vitamin (MULTIVITAMINS PO) Take 1 tablet by mouth. Daily.     Vladimir Faster Glycol-Propyl Glycol (SYSTANE OP) Apply 1 drop to eye daily as needed (dry eyes).    . predniSONE (DELTASONE) 5 MG tablet Take 10 mg by mouth daily. Take 10 mg po qd    . pregabalin (LYRICA) 75 MG capsule Take 1 capsule (75 mg total) by mouth 3 (three) times daily. 270 capsule 1  . promethazine (PHENERGAN) 25 MG tablet Take 1 tablet by mouth every 8 (eight) hours as needed for vomiting.   1  . ranitidine (ZANTAC) 150 MG tablet Take 150 mg by mouth 2 (two) times daily.     . risperiDONE (RISPERDAL) 0.5 MG tablet Take 0.5 tablets (0.25 mg total) by mouth 2 (two) times daily. 30 tablet 0  . shark liver oil-cocoa butter (PREPARATION H) 0.25-3-85.5 % suppository Place 1 suppository rectally 2 (two) times daily. Take 1 suppository BID x1 week, ending 02/17/16, then decrease to once qd for hemorrhoids    . traMADol (ULTRAM) 50 MG tablet Take 1 tablet (50 mg total) by mouth 3 (three) times daily as needed for moderate  pain. 30 tablet 0  . vitamin C (ASCORBIC ACID) 500 MG tablet Take 500 mg by mouth 2 (two) times daily.     Marland Kitchen VITAMIN K, PHYTONADIONE, PO Take 100 mcg by mouth daily. Supplement    . XIIDRA 5 % SOLN Place 1 drop into both eyes 2 (two) times daily as needed (dry eyes).   12     ROS:  History obtained from the patient  General ROS: negative for - chills, fatigue, fever, night sweats, weight gain or weight loss Psychological ROS: negative for - behavioral disorder, hallucinations, memory difficulties, mood swings or suicidal ideation Ophthalmic ROS: negative for - blurry vision, double vision, eye pain or loss of vision ENT ROS: negative for - epistaxis, nasal discharge, oral lesions, sore throat, tinnitus or vertigo Allergy and Immunology ROS: negative for - hives or itchy/watery eyes Hematological and Lymphatic ROS: negative for - bleeding problems, bruising or swollen lymph nodes Endocrine ROS: negative for - galactorrhea, hair pattern changes, polydipsia/polyuria or temperature intolerance Respiratory ROS: negative for - cough, hemoptysis, shortness of breath or wheezing Cardiovascular ROS: negative for - chest pain, dyspnea on exertion, edema or irregular heartbeat Gastrointestinal ROS: negative for - abdominal pain, diarrhea, hematemesis, nausea/vomiting or stool incontinence Genito-Urinary ROS: negative for - dysuria, hematuria, incontinence or urinary frequency/urgency Musculoskeletal ROS: negative for - joint swelling or muscular weakness Neurological ROS: as noted in HPI Dermatological ROS: negative for rash and skin lesion changes  Neurologic Examination:                                                                                                      Blood pressure 164/69, pulse 85, resp. rate 13, height 5' (1.524 m), weight 49.9 kg (110 lb), SpO2  100 %.  HEENT-  Normocephalic, no lesions, without obvious abnormality.  Normal external eye and conjunctiva.  Normal TM's bilaterally.  Normal auditory canals and external ears. Normal external nose, mucus membranes and septum.  Normal pharynx. Cardiovascular- S1, S2 normal, pulses palpable throughout   Lungs- chest clear, no wheezing, rales, normal symmetric air entry Abdomen- normal findings: bowel sounds normal Extremities- positive findings: tenderness and swelling of PIP and MCP joints, ulnar deviation and thenar atrophy bilateral Lymph-no adenopathy palpable Musculoskeletal-positive joint tenderness Skin-warm and dry, no hyperpigmentation, vitiligo, or suspicious lesions  Neurological Examination Mental Status: Alert, not oriented to year or place known dementia.  Speech fluent without evidence of aphasia.  Able to follow 3 step commands without difficulty. Cranial Nerves: II:  Visual fields grossly normal to peripheral visual testing III,IV, VI: ptosis not present, she has bilateral ophthalmoplegia with the ability didn't immediately deviate her left eye, with no significant movement of the right eye.. Patient's right eye had no horizontal gaze mild upward gaze but no downward gaze. pupils equal, round, reactive to light and accommodation V,VII: smile symmetric, facial light touch sensation normal bilaterally VIII: hearing normal bilaterally IX,X: uvula rises symmetrically XI: bilateral shoulder shrug XII: midline tongue extension Motor: Right : Upper extremity   5/5    Left:     Upper extremity   4/5  Lower extremity   5/5     Lower extremity   4/5 Tone and bulk:normal tone throughout; no atrophy noted Sensory: Pinprick and light touch intact throughout, bilaterally Deep Tendon Reflexes: 1+ and symmetric throughout Plantars: Right: downgoing   Left: downgoing Cerebellar: Mild dysmetria with finger-to-nose however there was also a component of difficulty seeing where she was  pointing her finger at  secondary to both cataracts and possibly diplopia, Gait: Not tested       Lab Results: Basic Metabolic Panel:  Recent Labs Lab 05/14/16 1111 05/14/16 1123  NA 133* 130*  K 4.6 4.5  CL 89* 86*  CO2 35*  --   GLUCOSE 406* 393*  BUN 22* 24*  CREATININE 1.38* 1.30*  CALCIUM 9.0  --     Liver Function Tests:  Recent Labs Lab 05/14/16 1111  AST 15  ALT 13*  ALKPHOS 85  BILITOT 0.8  PROT 5.5*  ALBUMIN 3.4*   No results for input(s): LIPASE, AMYLASE in the last 168 hours. No results for input(s): AMMONIA in the last 168 hours.  CBC:  Recent Labs Lab 05/14/16 1111 05/14/16 1123  WBC 8.0  --   NEUTROABS 5.4  --   HGB 9.6* 9.2*  HCT 29.8* 27.0*  MCV 89.2  --   PLT 220  --     Cardiac Enzymes: No results for input(s): CKTOTAL, CKMB, CKMBINDEX, TROPONINI in the last 168 hours.  Lipid Panel: No results for input(s): CHOL, TRIG, HDL, CHOLHDL, VLDL, LDLCALC in the last 168 hours.  CBG: No results for input(s): GLUCAP in the last 168 hours.  Microbiology: Results for orders placed or performed during the hospital encounter of 05/27/14  Gram stain     Status: None   Collection Time: 05/28/14  4:00 AM  Result Value Ref Range Status   Specimen Description FLUID SHOULDER RIGHT  Final   Special Requests NONE  Final   Gram Stain   Final    ABUNDANT WBC PRESENT,BOTH PMN AND MONONUCLEAR NO ORGANISMS SEEN    Report Status 05/28/2014 FINAL  Final  Anaerobic culture     Status: None   Collection Time: 05/28/14  4:00 AM  Result Value Ref Range Status   Specimen Description FLUID RIGHT SHOULDER  Final   Special Requests NONE  Final   Gram Stain   Final    ABUNDANT WBC PRESENT,BOTH PMN AND MONONUCLEAR NO ORGANISMS SEEN Performed at [facility]    Culture   Final    NO ANAEROBES ISOLATED Performed at [facility]    Report Status 06/02/2014 FINAL  Final  Body fluid culture     Status: None   Collection Time: 05/28/14   4:00 AM  Result Value Ref Range Status   Specimen Description FLUID RIGHT SHOULDER  Final   Special Requests NONE  Final   Gram Stain   Final    ABUNDANT WBC PRESENT,BOTH PMN AND MONONUCLEAR NO ORGANISMS SEEN Performed at [facility] Performed at [facility]    Culture   Final    NO GROWTH 3 DAYS Performed at [facility]    Report Status 05/31/2014 FINAL  Final  Urine culture     Status: None   Collection Time: 05/28/14 10:22 AM  Result Value Ref Range Status   Specimen Description URINE, CLEAN CATCH  Final   Special Requests NONE  Final   Colony Count   Final    50,000 COLONIES/ML Performed at [facility]    Culture   Final    Multiple bacterial morphotypes present, none predominant. Suggest appropriate recollection if clinically indicated. Performed at [facility]    Report Status 05/29/2014 FINAL  Final    Coagulation Studies:  Recent Labs  05/14/16 1111  LABPROT 12.0  INR 0.89    Imaging: Ct Head Wo Contrast  Result Date: 05/14/2016 CLINICAL DATA:  Pt brought in by EMS due to having blurry vision in right eye, abnormal gait, and leaning to the left EXAM: CT HEAD WITHOUT CONTRAST TECHNIQUE: Contiguous axial images were obtained from the base of the skull through the vertex without intravenous contrast. COMPARISON:  None MRI 05/14/2015 FINDINGS: Brain: No acute intracranial hemorrhage. No focal mass lesion. No CT evidence of acute infarction. No midline shift or mass effect. No hydrocephalus. Basilar cisterns are patent. Remote infarction in the LEFT cerebellum (image 9, series 3). Deep white matter infarction in the RIGHT frontal lobe also unchanged. Periventricular and subcortical white matter hypodensities. Generalized cortical atrophy. Vascular: Rim Skull: Normal. Negative for fracture or focal lesion. Sinuses/Orbits: Paranasal sinuses and mastoid air cells are clear. Orbits are clear. Other: None. IMPRESSION: 1. No acute  intracranial findings. 2. Chronic atrophy and white matter microvascular disease. 3. Remote cerebellar infarct. Electronically Signed   By: Suzy Bouchard M.D.   On: 05/14/2016 11:44       Assessment and plan discussed with with attending physician and they are in agreement.    Etta Quill PA-C [facility] [phone]  05/14/2016, 12:37 PM   As seen the patient and agree with the above note.  Assessment: 76 y.o. female with likely brainstem infarct given one half syndrome seen on exam. She does have bilateral vertebral occlusions, but appears to be filling her posterior circulation from the anterior fairly well and she was last known normal last night. She will need to be admitted and observed. She will need to avoid hypotension  Stroke Risk Factors - diabetes mellitus and hypertension  1. HgbA1c, fasting lipid panel 2. MRI, MRA  of the brain without contrast 3. Frequent neuro checks 4. Echocardiogram 5. Carotid dopplers 6. Prophylactic therapy-Antiplatelet med: Aspirin - dose 325mg  PO or 300mg  PR 7. Risk factor modification 8. Telemetry monitoring 9. PT consult, OT consult, Speech consult 10. please page stroke NP  Or  PA  Or MD  from 8am -4 pm starting 1/31 as this patient will be followed by the stroke team at this point.   You can look them up on [email or web address]   11. Avoid hypotension, no BP meds and would consider fluids.  Roland Rack, MD [facility] [phone]  If 7pm- 7am, please page neurology on call as listed in [place].

## 2016-05-14 NOTE — H&P (Signed)
History and Physical    BILL MCVEY YBZ:[id number] DOB: 21-Oct-1940 DOA: 05/14/2016   PCP: Pearson Grippe, MD   Patient coming from/Resides with: Private residence/this with husband  Admission status: Inpatient/SDU-medically necessary to stay a minimum 2 midnights to rule out impending and/or unexpected changes in physiologic status that may differ from initial evaluation performed in the ER and/or at time of admission. 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 Patient will be admitted to undergo full stroke evaluation. She has received IV contrast and has stage III chronic kidney disease so will require hydration before can undergo additional imaging to evaluate her type B aortic dissection recommended by vascular surgery. She will also require inpatient PT/OT evaluation as well as SLP evaluation. She will require frequent neurological checks and close monitoring of blood pressure to prevent hypotension as well as significant hypertension.  Chief Complaint: Blurry vision right eye  HPI: Marisa [name] is a 76 y.o. female with medical history significant for severe rheumatoid arthritis on chronic steroids and Arava, stage III chronic kidney disease, diabetes on insulin, hypertension, COPD O2 dependent on 2 L, protein calorie malnutrition, chronic anemia and GERD. Patient was last seen normal prior to going to bed last night. She reports she awakened this morning with blurry vision in the right eye and inability to see out of the left eye. She apparently was having some left-sided weakness as well. Because of unknown time well she did not qualify for code stroke/TPA due to being out of the window for appropriate intervention. That CT angiogram of the head and neck were obtained while in the ER recommendation of the neurologist and findings were  consistent with a brainstem infarct noting on exam patient presented with "one half syndrome". She was also found to have a type B aortic dissection and after evaluation by vascular surgery was felt that this was chronic in nature but a CT angiogram of the chest was recommended as well.  ED Course:  Vital Signs: BP 142/96   Pulse 89   Resp 18   Ht 5' (1.524 m)   Wt 49.9 kg (110 lb)   SpO2 100%   BMI 21.48 kg/m  CT head without contrast: No acute intracranial findings, remote cerebellar infarct CT angiogram head and neck: Partially visualized type B aortic dissection, no major intracranial arterial occlusion. Mild intracranial right ICA stenosis. Partial occlusion of the right V3 segment with distal reconstitution. Severe osseous compression of the left vertebral artery at the C3 level versus short segment occlusion secondary to chronic malalignment and osseous deformities. There is also some mild narrowing of the left the 3 segment compatible with short segment dissection also potentially related to prior trauma although acute dissection cannot be entirely excluded since there are no prior angiographic studies for comparison. No cervical carotid artery stenosis Lab data: Sodium 133, potassium 4.6, chloride 89, CO2 35, glucose 406, BUN 22, creatinine 1.38, LFTs normal, poc troponin 0.02, white count 8000 with normal differential, hemoglobin 9.6, platelets 220,000; urinalysis abnormal with greater than 500 glucose, 5 ketones, 100 protein, alcohol levels less than 5, UDS positive for benzodiazepines Medications and treatments: None  Review of Systems:  In addition to the HPI above,  No Fever-chills, myalgias or other constitutional symptoms No Headache, changes with hearing, dizziness, dysarthria or word finding difficulty, tremors or seizure activity No problems swallowing food or Liquids, indigestion/reflux,  choking or coughing while eating, abdominal pain with or after eating No Chest pain,  Cough or Shortness of Breath, palpitations, orthopnea or DOE No Abdominal pain, N/V, melena,hematochezia, dark tarry stools, constipation No dysuria, malodorous urine, hematuria or flank pain No new skin rashes, lesions, masses or bruises, No new joint pains, aches, swelling or redness No recent unintentional weight gain or loss No polyuria, polydypsia or polyphagia   Past Medical History:  Diagnosis Date  . Anemia   . Arthritis   . Asthma    Home 3L O2   . Chronic anemia   . Depression    patient feels depressed at the end of the month  . Diabetes mellitus type 2, insulin dependent (HCC)    initial diagnoses 11/2008  . Diabetes mellitus without complication (HCC)   . Diabetic ketoacidosis (HCC) 11/2010  . Diverticulosis   . Esophageal stricture 06/2004   Dilation 06/2004  . Family history of anesthesia complication    DAUGHTER HAS NAUSEA  . GERD (gastroesophageal reflux disease)   . history of  pericarditis 12/2002  . History of blood transfusion   . Hypertension   . Myocardial infarction    Chest pain s/p normal cath in 08/2004 then NSTEMI during 11/2010 admission, negative Myoview  . Osteoarthritis   . Oxygen deficiency   . Pericardial effusion   . Rectal bleeding 11/2010   Large hemorrhoids  . Rheumatoid arteritis   . Rheumatoid arthritis(714.0)    On MTX and chronic steroids  . Seizure (HCC) 11/2008  . Supraventricular tachycardia [facility])     Past Surgical History:  Procedure Laterality Date  . ABDOMINAL HYSTERECTOMY  1980  . CHOLECYSTECTOMY    . COLONOSCOPY N/A 01/06/2013   Procedure: COLONOSCOPY;  Surgeon: [name], MD;  Location: [facility] ENDOSCOPY;  Service: Endoscopy;  Laterality: N/A;  . ESOPHAGOGASTRODUODENOSCOPY N/A 02/11/2013   Procedure: ESOPHAGOGASTRODUODENOSCOPY (EGD);  Surgeon: Hilarie Fredrickson, MD;  Location: [facility] ENDOSCOPY;  Service: Endoscopy;  Laterality: N/A;  . INCISE AND DRAIN ABCESS  09/2011   I&D of peri-rectal abcess per Dr Abbey Chatters.     Social  History   Social History  . Marital status: Married    Spouse name: Shirlee Limerick  . Number of children: 2  . Years of education: 12+   Occupational History  . Retired    Social History Main Topics  . Smoking status: Never Smoker  . Smokeless tobacco: Never Used  . Alcohol use No  . Drug use: No  . Sexual activity: Not on file   Other Topics Concern  . Not on file   Social History Narrative   ** Merged History Encounter **       Pt lives at home with her husband and has a son and daughter. She is still ambulatory occasionally using a cane or walker. No toxic habits.    Caffeine use: 1 cup decaf daily       Mobility: Utilizes a cane Work history: Not obtained   Allergies  Allergen Reactions  . Demerol [Meperidine] Other (See Comments)    Hallucinations  . Penicillins Swelling and Other (See Comments)    Throat swelling Has patient had a PCN reaction causing immediate rash, facial/tongue/throat swelling, SOB or lightheadedness with hypotension: Yes Has patient had a PCN reaction causing severe rash involving mucus membranes or skin necrosis: No Has patient had a PCN reaction that required hospitalization No Has patient had a PCN reaction occurring within the last 10 years: No If all of the  above answers are "NO", then may proceed with Cephalosporin use.   . Enbrel [Etanercept] Swelling    Arm swelling   . Humira [Adalimumab] Swelling    Arm swelling     Family History  Problem Relation Age of Onset  . Heart disease Mother   . Stroke Father   . Dementia Neg Hx      Prior to Admission medications   Medication Sig Start Date End Date Taking? Authorizing Provider  AMBULATORY NON FORMULARY MEDICATION Continuous O2 @ 2.5-3 LMP   Yes Historical Provider, MD  Cholecalciferol (VITAMIN D PO) Take 1 tablet by mouth daily. 1000 units   Yes Historical Provider, MD  citalopram (CELEXA) 10 MG tablet Take 10 mg by mouth daily. 05/07/16  Yes Historical Provider, MD  Coenzyme  Q10 (CO Q 10) 100 MG CAPS Take 100 mg by mouth daily.   Yes Historical Provider, MD  diphenhydrAMINE (BENADRYL) 25 MG tablet Take 25 mg by mouth every 8 (eight) hours as needed for allergies.    Yes Historical Provider, MD  docusate sodium (COLACE) 100 MG capsule Take 100 mg by mouth.    Yes Historical Provider, MD  donepezil (ARICEPT) 10 MG tablet Take 1 tablet (10 mg total) by mouth at bedtime. 05/09/15  Yes Melvenia Beam, MD  fluticasone (FLONASE) 50 MCG/ACT nasal spray Place 2 sprays into both nostrils daily as needed for allergies or rhinitis.   Yes Historical Provider, MD  Glucosamine-Fish Oil-EPA-DHA (GLUCOSAMINE & FISH OIL PO) Take 1,000 mg by mouth 2 (two) times daily.    Yes Historical Provider, MD  insulin aspart (NOVOLOG) 100 UNIT/ML injection 0-9 Units, Subcutaneous, 3 times daily with meals CBG < 70: implement hypoglycemia protocol CBG 70 - 120: 0 units CBG 121 - 150: 1 unit CBG 151 - 200: 2 units CBG 201 - 250: 3 units CBG 251 - 300: 5 units CBG 301 - 350: 7 units CBG 351 - 400: 9 units CBG > 400: call MD 01/11/14  Yes Shanker Kristeen Mans, MD  insulin detemir (LEVEMIR) 100 UNIT/ML injection Inject 16 Units into the skin. Give at [facility]    Yes Historical Provider, MD  ipratropium-albuterol (DUONEB) 0.5-2.5 (3) MG/3ML SOLN Take 3 mLs by nebulization every 6 (six) hours as needed.   Yes Historical Provider, MD  leflunomide (ARAVA) 20 MG tablet Take 20 mg by mouth daily.    Yes Historical Provider, MD  levalbuterol (XOPENEX HFA) 45 MCG/ACT inhaler Inhale 2 puffs into the lungs every 4 (four) hours as needed for wheezing (wheezing).    Yes Historical Provider, MD  LORazepam (ATIVAN) 1 MG tablet Take 0.5 tablets (0.5 mg total) by mouth every 4 (four) hours as needed for anxiety. 01/22/16  Yes Costin Karlyne Greenspan, MD  Menthol, Topical Analgesic, (BIOFREEZE) 4 % GEL Apply 1 application topically daily. To bilateral knees and mid back   Yes Historical Provider, MD  metoprolol succinate (TOPROL-XL)  100 MG 24 hr tablet Take 100 mg by mouth daily. Take with or immediately following a meal.    Yes Historical Provider, MD  Multiple Vitamin (MULTIVITAMINS PO) Take 1 tablet by mouth. Daily.    Yes Historical Provider, MD  Nutritional Supplements (ESTROVEN PO) Take 1 capsule by mouth daily.   Yes Historical Provider, MD  Polyethyl Glycol-Propyl Glycol (SYSTANE OP) Apply 1 drop to eye daily as needed (dry eyes).   Yes Historical Provider, MD  predniSONE (DELTASONE) 5 MG tablet Take 10 mg by mouth daily. Take 10 mg po  qd 12/25/15  Yes Historical Provider, MD  pregabalin (LYRICA) 75 MG capsule Take 1 capsule (75 mg total) by mouth 3 (three) times daily. 03/28/16  Yes Melvenia Beam, MD  promethazine (PHENERGAN) 25 MG tablet Take 1 tablet by mouth every 8 (eight) hours as needed for vomiting.  07/31/15  Yes Historical Provider, MD  ranitidine (ZANTAC) 150 MG tablet Take 150 mg by mouth 2 (two) times daily.    Yes Historical Provider, MD  risperiDONE (RISPERDAL) 0.5 MG tablet Take 0.5 tablets (0.25 mg total) by mouth 2 (two) times daily. Patient taking differently: Take 0.5 mg by mouth 2 (two) times daily.  01/11/14  Yes Shanker Kristeen Mans, MD  traMADol (ULTRAM) 50 MG tablet Take 1 tablet (50 mg total) by mouth 3 (three) times daily as needed for moderate pain. 01/22/16  Yes Costin Karlyne Greenspan, MD  vitamin C (ASCORBIC ACID) 500 MG tablet Take 500 mg by mouth 2 (two) times daily.    Yes Historical Provider, MD  VITAMIN K, PHYTONADIONE, PO Take 100 mcg by mouth daily. Supplement   Yes Historical Provider, MD  XIIDRA 5 % SOLN Place 1 drop into both eyes 2 (two) times daily as needed (dry eyes).  12/02/15  Yes Historical Provider, MD  dicyclomine (BENTYL) 10 MG/5ML syrup Take 10 mg by mouth 2 (two) times daily.    Historical Provider, MD  estrogens, conjugated, (PREMARIN) 0.625 MG tablet Take 0.625 mg by mouth daily.     Historical Provider, MD  Fe Fum-FePoly-Vit C-Vit B3 (INTEGRA PO) Take 1 capsule by mouth 2 (two)  times daily. Supplement    Historical Provider, MD  insulin lispro (HUMALOG) 100 UNIT/ML injection Inject 5 Units into the skin at bedtime. For CBG >/= to 200    Historical Provider, MD  insulin lispro (HUMALOG) 100 UNIT/ML injection Inject 5 Units into the skin 3 (three) times daily before meals. For CBG >/= 200 (in addition to sliding scale orders)    Historical Provider, MD  shark liver oil-cocoa butter (PREPARATION H) 0.25-3-85.5 % suppository Place 1 suppository rectally 2 (two) times daily. Take 1 suppository BID x1 week, ending 02/17/16, then decrease to once qd for hemorrhoids 02/10/16   Historical Provider, MD    Physical Exam: Vitals:   05/14/16 1500 05/14/16 1530 05/14/16 1545 05/14/16 1600  BP: 125/66 138/99 135/95 142/96  Pulse: 89 95 92 89  Resp: '16 26 22 18  '$ SpO2: 100% 100% 100% 100%  Weight:      Height:          Constitutional: NAD, calm, comfortable Eyes: Disconjugate gaze with inability to move the right eye when testing EOMs, reports blurred vision right eye and inability to see left eye, difficulty with full EOMs on the left, no nystagmus ENMT: Mucous membranes are dry. Posterior pharynx clear of any exudate or lesions. Poor dentition. Question underlying hearing loss Neck: normal, supple, no masses, no thyromegaly Respiratory: clear to auscultation bilaterally, no wheezing, no crackles. Normal respiratory effort. No accessory muscle use.  Cardiovascular: Regular rate and rhythm, no murmurs / rubs / gallops. No extremity edema. 2+ pedal pulses. No carotid bruits.  Abdomen: no tenderness, no masses palpated. No hepatosplenomegaly. Bowel sounds positive.  Musculoskeletal: no clubbing / cyanosis. No large joint deformity upper and lower extremities. Patient has multiple small joint deformities consistent with known rheumatoid arthritis. Fair ROM, no contractures. Normal muscle tone.  Skin: no rashes, lesions, ulcers. No induration Neurologic: CN 2-12 grossly intact except  for noted visual  disturbances and abnormal EOMs as documented above. Sensation intact, DTR normal. Strength 5/5 in the right with decreased strength 4/5 on the left. Some mild left lower extremity neglect noting if touched her left leg and asked her to move her left leg and she moved her right leg first Psychiatric: Normal judgment and insight. Alert and oriented x 3. Normal mood.    Labs on Admission: I have personally reviewed following labs and imaging studies  CBC:  Recent Labs Lab 05/14/16 1111 05/14/16 1123  WBC 8.0  --   NEUTROABS 5.4  --   HGB 9.6* 9.2*  HCT 29.8* 27.0*  MCV 89.2  --   PLT 220  --    Basic Metabolic Panel:  Recent Labs Lab 05/14/16 1111 05/14/16 1123  NA 133* 130*  K 4.6 4.5  CL 89* 86*  CO2 35*  --   GLUCOSE 406* 393*  BUN 22* 24*  CREATININE 1.38* 1.30*  CALCIUM 9.0  --    GFR: Estimated Creatinine Clearance: 26.9 mL/min (by C-G formula based on SCr of 1.3 mg/dL (H)). Liver Function Tests:  Recent Labs Lab 05/14/16 1111  AST 15  ALT 13*  ALKPHOS 85  BILITOT 0.8  PROT 5.5*  ALBUMIN 3.4*   No results for input(s): LIPASE, AMYLASE in the last 168 hours. No results for input(s): AMMONIA in the last 168 hours. Coagulation Profile:  Recent Labs Lab 05/14/16 1111  INR 0.89   Cardiac Enzymes: No results for input(s): CKTOTAL, CKMB, CKMBINDEX, TROPONINI in the last 168 hours. BNP (last 3 results) No results for input(s): PROBNP in the last 8760 hours. HbA1C: No results for input(s): HGBA1C in the last 72 hours. CBG:  Recent Labs Lab 05/14/16 1553  GLUCAP 464*   Lipid Profile: No results for input(s): CHOL, HDL, LDLCALC, TRIG, CHOLHDL, LDLDIRECT in the last 72 hours. Thyroid Function Tests: No results for input(s): TSH, T4TOTAL, FREET4, T3FREE, THYROIDAB in the last 72 hours. Anemia Panel: No results for input(s): VITAMINB12, FOLATE, FERRITIN, TIBC, IRON, RETICCTPCT in the last 72 hours. Urine analysis:    Component Value  Date/Time   COLORURINE YELLOW 05/14/2016 1359   APPEARANCEUR CLEAR 05/14/2016 1359   LABSPEC 1.012 05/14/2016 1359   PHURINE 5.0 05/14/2016 1359   GLUCOSEU >=500 (A) 05/14/2016 1359   HGBUR NEGATIVE 05/14/2016 1359   BILIRUBINUR NEGATIVE 05/14/2016 1359   KETONESUR 5 (A) 05/14/2016 1359   PROTEINUR 100 (A) 05/14/2016 1359   UROBILINOGEN 0.2 05/28/2014 1021   NITRITE NEGATIVE 05/14/2016 1359   LEUKOCYTESUR NEGATIVE 05/14/2016 1359   Sepsis Labs: '@LABRCNTIP'$ (procalcitonin:4,lacticidven:4) )No results found for this or any previous visit (from the past 240 hour(s)).   Radiological Exams on Admission: Ct Angio Head W Or Wo Contrast  Result Date: 05/14/2016 CLINICAL DATA:  Left-sided weakness.  Left sixth nerve palsy. EXAM: CT ANGIOGRAPHY HEAD AND NECK TECHNIQUE: Multidetector CT imaging of the head and neck was performed using the standard protocol during bolus administration of intravenous contrast. Multiplanar CT image reconstructions and MIPs were obtained to evaluate the vascular anatomy. Carotid stenosis measurements (when applicable) are obtained utilizing NASCET criteria, using the distal internal carotid diameter as the denominator. CONTRAST:  50 mL Isovue 370 COMPARISON:  Noncontrast head CT earlier today and MRI 05/14/2015. Cervical spine MRI 08/19/2015. No prior angiographic imaging. FINDINGS: CTA NECK FINDINGS Aortic arch: 3 vessel aortic arch with moderate atherosclerotic plaque. Dilated distal aortic arch measuring 3.4 cm in diameter. Partially visualized aortic dissection in the proximal descending aorta. Eccentric calcified plaque  in the proximal left subclavian artery results in less than 50% narrowing. Right carotid system: Medialized course of the common and proximal internal carotid arteries. Mild atherosclerotic plaque at the carotid bifurcation without stenosis. No evidence of dissection. Left carotid system: Medialized course of the common and proximal internal carotid  arteries. No stenosis or evidence of dissection. Vertebral arteries: The right vertebral artery is patent proximally with mild-to-moderate proximal V1 stenosis. Additional mild atherosclerotic narrowing is present in the right V2 segment, and the right vertebral artery occludes at the C2 level with distal reconstitution of the distal V3 and V4 segments. The left vertebral artery is either markedly compressed (and not visualize) or occluded over a short segment at the level of the C3 transverse foramen. The left vertebral artery is patent proximal and distal to this, however there is a caliber change with mild smooth narrowing at the C1-2 level followed by a return to a normal caliber more distally in the the V3 segment. Skeleton: Abnormal cervical alignment as demonstrated on prior MRI. There is 8 mm anterior subluxation of C3 on C4 with appearance suggesting perched, fused facets on the left. There is C4 and C5 vertebral body height loss, and there is fusion between the vertebral bodies from C3-C6 which may reflect posttraumatic degenerative change. There is prominent focal kyphosis centered at C4, and there is slight anterolisthesis of C2 on C3. Mild erosive changes are noted involving the dens. Other neck: No neck mass or lymph node enlargement. Upper chest: Minimal pleural-parenchymal scarring in the lung apices. Review of the MIP images confirms the above findings CTA HEAD FINDINGS Anterior circulation: The internal carotid arteries are patent from skullbase to carotid termini with bilateral siphon atherosclerosis resulting in mild to moderate luminal irregularity. There is mild right ICA stenosis near the petrous - cavernous junction. An infundibulum is noted at the left posterior communicating origin. Anterior communicating artery is unremarkable. ACAs and MCAs are patent without evidence of major branch occlusion or significant proximal stenosis. No aneurysm. Posterior circulation: Intracranial vertebral  arteries are patent without focal stenosis. Left PICA, bilateral AICA, and bilateral SCA origins are patent. Basilar artery is widely patent. There are small posterior communicating arteries bilaterally. The PCAs are patent without evidence of significant stenosis. No aneurysm. Venous sinuses: Patent. Anatomic variants: None. Delayed phase: No abnormal enhancement. Review of the MIP images confirms the above findings IMPRESSION: 1. Partially visualized type B aortic dissection. 2. No major intracranial arterial occlusion. Mild intracranial right ICA stenosis. 3. Partial occlusion of the right V3 segment with distal reconstitution. 4. Severe osseous compression of the left vertebral artery at the C3 level versus short segment occlusion secondary to chronic malalignment and osseous deformities, most likely related to remote trauma. 5. Mild narrowing of the left V3 segment compatible with short segment dissection, also potentially related to prior trauma although an acute dissection cannot be entirely excluded given the lack of prior angiographic studies for comparison. 6. No cervical carotid artery stenosis. These results were called by telephone at the time of interpretation on 05/14/2016 at 1:56 pm to Dr. Merrily Pew , who verbally acknowledged these results. Electronically Signed   By: Logan Bores M.D.   On: 05/14/2016 14:00   Ct Head Wo Contrast  Result Date: 05/14/2016 CLINICAL DATA:  Pt brought in by EMS due to having blurry vision in right eye, abnormal gait, and leaning to the left EXAM: CT HEAD WITHOUT CONTRAST TECHNIQUE: Contiguous axial images were obtained from the base of the skull  through the vertex without intravenous contrast. COMPARISON:  None MRI 05/14/2015 FINDINGS: Brain: No acute intracranial hemorrhage. No focal mass lesion. No CT evidence of acute infarction. No midline shift or mass effect. No hydrocephalus. Basilar cisterns are patent. Remote infarction in the LEFT cerebellum (image 9,  series 3). Deep white matter infarction in the RIGHT frontal lobe also unchanged. Periventricular and subcortical white matter hypodensities. Generalized cortical atrophy. Vascular: Rim Skull: Normal. Negative for fracture or focal lesion. Sinuses/Orbits: Paranasal sinuses and mastoid air cells are clear. Orbits are clear. Other: None. IMPRESSION: 1. No acute intracranial findings. 2. Chronic atrophy and white matter microvascular disease. 3. Remote cerebellar infarct. Electronically Signed   By: Suzy Bouchard M.D.   On: 05/14/2016 11:44   Ct Angio Neck W And/or Wo Contrast  Result Date: 05/14/2016 CLINICAL DATA:  Left-sided weakness.  Left sixth nerve palsy. EXAM: CT ANGIOGRAPHY HEAD AND NECK TECHNIQUE: Multidetector CT imaging of the head and neck was performed using the standard protocol during bolus administration of intravenous contrast. Multiplanar CT image reconstructions and MIPs were obtained to evaluate the vascular anatomy. Carotid stenosis measurements (when applicable) are obtained utilizing NASCET criteria, using the distal internal carotid diameter as the denominator. CONTRAST:  50 mL Isovue 370 COMPARISON:  Noncontrast head CT earlier today and MRI 05/14/2015. Cervical spine MRI 08/19/2015. No prior angiographic imaging. FINDINGS: CTA NECK FINDINGS Aortic arch: 3 vessel aortic arch with moderate atherosclerotic plaque. Dilated distal aortic arch measuring 3.4 cm in diameter. Partially visualized aortic dissection in the proximal descending aorta. Eccentric calcified plaque in the proximal left subclavian artery results in less than 50% narrowing. Right carotid system: Medialized course of the common and proximal internal carotid arteries. Mild atherosclerotic plaque at the carotid bifurcation without stenosis. No evidence of dissection. Left carotid system: Medialized course of the common and proximal internal carotid arteries. No stenosis or evidence of dissection. Vertebral arteries: The  right vertebral artery is patent proximally with mild-to-moderate proximal V1 stenosis. Additional mild atherosclerotic narrowing is present in the right V2 segment, and the right vertebral artery occludes at the C2 level with distal reconstitution of the distal V3 and V4 segments. The left vertebral artery is either markedly compressed (and not visualize) or occluded over a short segment at the level of the C3 transverse foramen. The left vertebral artery is patent proximal and distal to this, however there is a caliber change with mild smooth narrowing at the C1-2 level followed by a return to a normal caliber more distally in the the V3 segment. Skeleton: Abnormal cervical alignment as demonstrated on prior MRI. There is 8 mm anterior subluxation of C3 on C4 with appearance suggesting perched, fused facets on the left. There is C4 and C5 vertebral body height loss, and there is fusion between the vertebral bodies from C3-C6 which may reflect posttraumatic degenerative change. There is prominent focal kyphosis centered at C4, and there is slight anterolisthesis of C2 on C3. Mild erosive changes are noted involving the dens. Other neck: No neck mass or lymph node enlargement. Upper chest: Minimal pleural-parenchymal scarring in the lung apices. Review of the MIP images confirms the above findings CTA HEAD FINDINGS Anterior circulation: The internal carotid arteries are patent from skullbase to carotid termini with bilateral siphon atherosclerosis resulting in mild to moderate luminal irregularity. There is mild right ICA stenosis near the petrous - cavernous junction. An infundibulum is noted at the left posterior communicating origin. Anterior communicating artery is unremarkable. ACAs and MCAs are patent without  evidence of major branch occlusion or significant proximal stenosis. No aneurysm. Posterior circulation: Intracranial vertebral arteries are patent without focal stenosis. Left PICA, bilateral AICA, and  bilateral SCA origins are patent. Basilar artery is widely patent. There are small posterior communicating arteries bilaterally. The PCAs are patent without evidence of significant stenosis. No aneurysm. Venous sinuses: Patent. Anatomic variants: None. Delayed phase: No abnormal enhancement. Review of the MIP images confirms the above findings IMPRESSION: 1. Partially visualized type B aortic dissection. 2. No major intracranial arterial occlusion. Mild intracranial right ICA stenosis. 3. Partial occlusion of the right V3 segment with distal reconstitution. 4. Severe osseous compression of the left vertebral artery at the C3 level versus short segment occlusion secondary to chronic malalignment and osseous deformities, most likely related to remote trauma. 5. Mild narrowing of the left V3 segment compatible with short segment dissection, also potentially related to prior trauma although an acute dissection cannot be entirely excluded given the lack of prior angiographic studies for comparison. 6. No cervical carotid artery stenosis. These results were called by telephone at the time of interpretation on 05/14/2016 at 1:56 pm to Dr. Merrily Pew , who verbally acknowledged these results. Electronically Signed   By: Logan Bores M.D.   On: 05/14/2016 14:00    EKG: (Independently reviewed) sinus rhythm with ventricular rate 87 bpm, QTC 418 ms, voltage criteria met for LVH, normal R-wave rotation, nonspecific ST segment changes in the lateral leads consistent with LVH/LV strain  Assessment/Plan Principal Problem:   Brainstem infarct, acute  -Patient presents with significant visual acuity changes associated with abnormal eye movement and left-sided weakness with diagnostic evidence of brainstem infarct -Neurology following -Underwent CTA head and neck no indication for carotid duplex -Stat MRI/MRA brain (sedate with IV Ativan) -Echocardiogram -Frequent neurological checks -Risk stratification with lipid  panel and hemoglobin A1c -Antiplatelet with aspirin -PT/OT/SLP evaluation -NPO until passes aren't swallowing evaluation -Neurology documents "avoid hypotension"  Active Problems:   Diabetes mellitus type 2, insulin dependent uncontrolled -Patient presents with CBGs greater than 400 with associated pseudohyponatremia -Resume Levemir-first dose now -CBGs -SSI -Hydration    Chronic renal insufficiency, stage 3 (moderate) -Renal function stable and at baseline: 24/1.3    Dissecting aneurysm of thoracic aorta, Stanford type B  -Incidental finding on CT angiogram of the neck -Appreciate vascular surgery assistance -Recommend CT angiogram of the chest to further evaluate her dissection. EDP discussed with radiologist who recommended hydrating for least 24 hours and following renal function before pursuing additional contrast studies -Vascular MD suspects this is chronic in nature -Focus on appropriate blood pressure control avoiding significant hypertension    HTN (hypertension) -Current blood pressure controlled -Continue low-dose Lopressor IV every 8 hours -For significant hypertension I have ordered PRN the hydralazine    Chronic respiratory failure with hypoxemia/COPD (chronic obstructive pulmonary disease)  -Clinically stable on home levels of O2 (2 L/m) -Continue nebs and MDI      Rheumatoid arthritis  -On prednisone 10 mg daily along with Arava -Solu-Medrol 40 mg IV daily while NPO    GERD    Protein calorie malnutrition  -May benefit from nutrition evaluation during the hospitalization    Iron deficiency anemia due to chronic blood loss -Baseline hemoglobin appears to be between 7.7 and 8.7 -Current hemoglobin 9.6 so likely reflective of mild hemoconcentration      DVT prophylaxis: SCDs Code Status: Full  Family Communication: Family at bedside Disposition Plan: Anticipate discharge back to preadmission home environment pending PT/OT evaluation Consults  called:  Neurology/Kirkpatrick; Vascular surgery/Dickson     ELLIS,ALLISON L. ANP-BC [facility] Pager [phone]   If 7PM-7AM, please contact night-coverage [email or web address] Password [facility]  05/14/2016, 4:27 PM

## 2016-05-15 ENCOUNTER — Inpatient Hospital Stay (HOSPITAL_COMMUNITY): Payer: Medicare Other

## 2016-05-15 ENCOUNTER — Encounter (HOSPITAL_COMMUNITY): Payer: Self-pay | Admitting: Radiology

## 2016-05-15 DIAGNOSIS — I6789 Other cerebrovascular disease: Secondary | ICD-10-CM

## 2016-05-15 DIAGNOSIS — H532 Diplopia: Secondary | ICD-10-CM

## 2016-05-15 DIAGNOSIS — I639 Cerebral infarction, unspecified: Secondary | ICD-10-CM

## 2016-05-15 LAB — BASIC METABOLIC PANEL
Anion gap: 16 — ABNORMAL HIGH (ref 5–15)
Anion gap: 10 (ref 5–15)
Anion gap: 8 (ref 5–15)
Anion gap: 9 (ref 5–15)
BUN: 32 mg/dL — ABNORMAL HIGH (ref 6–20)
BUN: 33 mg/dL — ABNORMAL HIGH (ref 6–20)
BUN: 33 mg/dL — ABNORMAL HIGH (ref 6–20)
BUN: 32 mg/dL — ABNORMAL HIGH (ref 6–20)
CO2: 25 mmol/L (ref 22–32)
CO2: 28 mmol/L (ref 22–32)
CO2: 32 mmol/L (ref 22–32)
CO2: 29 mmol/L (ref 22–32)
Calcium: 8.5 mg/dL — ABNORMAL LOW (ref 8.9–10.3)
Calcium: 8.4 mg/dL — ABNORMAL LOW (ref 8.9–10.3)
Calcium: 8.5 mg/dL — ABNORMAL LOW (ref 8.9–10.3)
Calcium: 8.3 mg/dL — ABNORMAL LOW (ref 8.9–10.3)
Chloride: 95 mmol/L — ABNORMAL LOW (ref 101–111)
Chloride: 101 mmol/L (ref 101–111)
Chloride: 103 mmol/L (ref 101–111)
Chloride: 103 mmol/L (ref 101–111)
Creatinine, Ser: 1.75 mg/dL — ABNORMAL HIGH (ref 0.44–1.00)
Creatinine, Ser: 1.65 mg/dL — ABNORMAL HIGH (ref 0.44–1.00)
Creatinine, Ser: 1.48 mg/dL — ABNORMAL HIGH (ref 0.44–1.00)
Creatinine, Ser: 1.45 mg/dL — ABNORMAL HIGH (ref 0.44–1.00)
GFR calc Af Amer: 32 mL/min — ABNORMAL LOW (ref >60)
GFR calc Af Amer: 34 mL/min — ABNORMAL LOW (ref >60)
GFR calc Af Amer: 39 mL/min — ABNORMAL LOW (ref >60)
GFR calc Af Amer: 40 mL/min — ABNORMAL LOW (ref >60)
GFR calc non Af Amer: 27 mL/min — ABNORMAL LOW (ref >60)
GFR calc non Af Amer: 29 mL/min — ABNORMAL LOW (ref >60)
GFR calc non Af Amer: 33 mL/min — ABNORMAL LOW (ref >60)
GFR calc non Af Amer: 34 mL/min — ABNORMAL LOW (ref >60)
Glucose, Bld: 443 mg/dL — ABNORMAL HIGH (ref 65–99)
Glucose, Bld: 280 mg/dL — ABNORMAL HIGH (ref 65–99)
Glucose, Bld: 59 mg/dL — ABNORMAL LOW (ref 65–99)
Glucose, Bld: 127 mg/dL — ABNORMAL HIGH (ref 65–99)
Potassium: 4.5 mmol/L (ref 3.5–5.1)
Potassium: 4.1 mmol/L (ref 3.5–5.1)
Potassium: 3.8 mmol/L (ref 3.5–5.1)
Potassium: 4.1 mmol/L (ref 3.5–5.1)
Sodium: 136 mmol/L (ref 135–145)
Sodium: 139 mmol/L (ref 135–145)
Sodium: 143 mmol/L (ref 135–145)
Sodium: 141 mmol/L (ref 135–145)

## 2016-05-15 LAB — GLUCOSE, CAPILLARY
GLUCOSE-CAPILLARY: 518 mg/dL — AB (ref 65–99)
GLUCOSE-CAPILLARY: 458 mg/dL — AB (ref 65–99)
GLUCOSE-CAPILLARY: 391 mg/dL — AB (ref 65–99)
GLUCOSE-CAPILLARY: 145 mg/dL — AB (ref 65–99)
GLUCOSE-CAPILLARY: 137 mg/dL — AB (ref 65–99)
GLUCOSE-CAPILLARY: 116 mg/dL — AB (ref 65–99)
GLUCOSE-CAPILLARY: 108 mg/dL — AB (ref 65–99)
GLUCOSE-CAPILLARY: 82 mg/dL (ref 65–99)
GLUCOSE-CAPILLARY: 98 mg/dL (ref 65–99)
GLUCOSE-CAPILLARY: 294 mg/dL — AB (ref 65–99)
Glucose-Capillary: 310 mg/dL — ABNORMAL HIGH (ref 65–99)
Glucose-Capillary: 207 mg/dL — ABNORMAL HIGH (ref 65–99)
Glucose-Capillary: 183 mg/dL — ABNORMAL HIGH (ref 65–99)
Glucose-Capillary: 51 mg/dL — ABNORMAL LOW (ref 65–99)

## 2016-05-15 LAB — ECHOCARDIOGRAM COMPLETE
HEIGHTINCHES: 60 in
WEIGHTICAEL: 1760 [oz_av]

## 2016-05-15 LAB — MRSA PCR SCREENING: MRSA BY PCR: NEGATIVE

## 2016-05-15 LAB — LIPID PANEL
CHOLESTEROL: 185 mg/dL (ref 0–200)
HDL: 79 mg/dL (ref >40)
LDL Cholesterol: 81 mg/dL (ref 0–99)
TRIGLYCERIDES: 123 mg/dL (ref <150)
Total CHOL/HDL Ratio: 2.3 RATIO
VLDL: 25 mg/dL (ref 0–40)

## 2016-05-15 LAB — HEMOGLOBIN A1C
HEMOGLOBIN A1C: 7.1 % — AB (ref 4.8–5.6)
MEAN PLASMA GLUCOSE: 157 mg/dL

## 2016-05-15 LAB — CBG MONITORING, ED: GLUCOSE-CAPILLARY: 556 mg/dL — AB (ref 65–99)

## 2016-05-15 MED ORDER — LIFITEGRAST 5 % OP SOLN: 1.0000 [drp] | Freq: Two times a day (BID) | OPHTHALMIC | Status: DC | PRN

## 2016-05-15 MED ORDER — IPRATROPIUM-ALBUTEROL 0.5-2.5 (3) MG/3ML IN SOLN: 3.0000 mL | Freq: Four times a day (QID) | RESPIRATORY_TRACT | Status: DC | PRN

## 2016-05-15 MED ORDER — ASPIRIN 325 MG PO TABS
325.0000 mg | ORAL_TABLET | Freq: Every day | ORAL | Status: DC
  Administered 2016-05-16 – 2016-05-23 (×8): 325 mg via ORAL
  Filled 2016-05-15 (×8): qty 1

## 2016-05-15 MED ORDER — CITALOPRAM HYDROBROMIDE 20 MG PO TABS
10.0000 mg | ORAL_TABLET | Freq: Every day | ORAL | Status: DC
  Administered 2016-05-15 – 2016-05-23 (×9): 10 mg via ORAL
  Filled 2016-05-15 (×9): qty 1

## 2016-05-15 MED ORDER — METOPROLOL TARTRATE 5 MG/5ML IV SOLN
5.0000 mg | Freq: Four times a day (QID) | INTRAVENOUS | Status: DC
  Administered 2016-05-15 – 2016-05-17 (×8): 5 mg via INTRAVENOUS
  Filled 2016-05-15 (×8): qty 5

## 2016-05-15 MED ORDER — INSULIN DETEMIR 100 UNIT/ML ~~LOC~~ SOLN
16.0000 [IU] | SUBCUTANEOUS | Status: AC
  Administered 2016-05-15: 16 [IU] via SUBCUTANEOUS
  Filled 2016-05-15 (×2): qty 0.16

## 2016-05-15 MED ORDER — INSULIN ASPART 100 UNIT/ML ~~LOC~~ SOLN
0.0000 [IU] | Freq: Three times a day (TID) | SUBCUTANEOUS | Status: DC
  Administered 2016-05-16: 2 [IU] via SUBCUTANEOUS
  Administered 2016-05-17: 7 [IU] via SUBCUTANEOUS

## 2016-05-15 MED ORDER — CLOPIDOGREL BISULFATE 75 MG PO TABS
75.0000 mg | ORAL_TABLET | Freq: Every day | ORAL | Status: DC
  Administered 2016-05-15 – 2016-05-23 (×9): 75 mg via ORAL
  Filled 2016-05-15 (×9): qty 1

## 2016-05-15 MED ORDER — INSULIN DETEMIR 100 UNIT/ML ~~LOC~~ SOLN
16.0000 [IU] | Freq: Once | SUBCUTANEOUS | Status: DC
  Filled 2016-05-15: qty 0.16

## 2016-05-15 MED ORDER — POLYVINYL ALCOHOL 1.4 % OP SOLN
Freq: Every day | OPHTHALMIC | Status: DC | PRN
  Filled 2016-05-15 (×2): qty 15

## 2016-05-15 MED ORDER — SODIUM CHLORIDE 0.9 % IV BOLUS (SEPSIS)
250.0000 mL | Freq: Once | INTRAVENOUS | Status: AC
  Administered 2016-05-15: 250 mL via INTRAVENOUS

## 2016-05-15 MED ORDER — LIFITEGRAST 5 % OP SOLN
1.0000 [drp] | Freq: Two times a day (BID) | OPHTHALMIC | Status: DC | PRN
Start: 2016-05-15 — End: 2016-05-15

## 2016-05-15 MED ORDER — PE-SHARK LIVER OIL-COCOA BUTTR 0.25-3-85.5 % RE SUPP: 1.0000 | Freq: Two times a day (BID) | RECTAL | Status: DC

## 2016-05-15 MED ORDER — SODIUM CHLORIDE 0.9 % IV SOLN: INTRAVENOUS | Status: DC

## 2016-05-15 MED ORDER — ASPIRIN 300 MG RE SUPP
300.0000 mg | Freq: Every day | RECTAL | Status: DC
  Administered 2016-05-15: 300 mg via RECTAL
  Filled 2016-05-15: qty 1

## 2016-05-15 MED ORDER — INSULIN ASPART 100 UNIT/ML ~~LOC~~ SOLN
0.0000 [IU] | Freq: Every day | SUBCUTANEOUS | Status: DC
  Administered 2016-05-15: 3 [IU] via SUBCUTANEOUS

## 2016-05-15 MED ORDER — ACETAMINOPHEN 160 MG/5ML PO SOLN: 650.0000 mg | ORAL | Status: DC | PRN

## 2016-05-15 MED ORDER — INSULIN DETEMIR 100 UNIT/ML ~~LOC~~ SOLN
12.0000 [IU] | Freq: Every day | SUBCUTANEOUS | Status: DC
  Administered 2016-05-16: 12 [IU] via SUBCUTANEOUS
  Filled 2016-05-15 (×2): qty 0.12

## 2016-05-15 MED ORDER — PRAVASTATIN SODIUM 20 MG PO TABS
20.0000 mg | ORAL_TABLET | Freq: Every day | ORAL | Status: DC
  Administered 2016-05-15 – 2016-05-22 (×8): 20 mg via ORAL
  Filled 2016-05-15 (×8): qty 1

## 2016-05-15 MED ORDER — STROKE: EARLY STAGES OF RECOVERY BOOK
Freq: Once | Status: AC
  Administered 2016-05-15: 06:00:00
  Filled 2016-05-15: qty 1

## 2016-05-15 MED ORDER — PREDNISONE 10 MG PO TABS
10.0000 mg | ORAL_TABLET | Freq: Every day | ORAL | Status: DC
  Administered 2016-05-15 – 2016-05-23 (×9): 10 mg via ORAL
  Filled 2016-05-15 (×9): qty 1

## 2016-05-15 MED ORDER — ACETAMINOPHEN 325 MG PO TABS
650.0000 mg | ORAL_TABLET | ORAL | Status: DC | PRN
  Administered 2016-05-16 – 2016-05-21 (×7): 650 mg via ORAL
  Filled 2016-05-15 (×7): qty 2

## 2016-05-15 MED ORDER — FLUTICASONE PROPIONATE 50 MCG/ACT NA SUSP: 2.0000 | Freq: Every day | NASAL | Status: DC | PRN

## 2016-05-15 MED ORDER — MUSCLE RUB 10-15 % EX CREA
1.0000 "application " | TOPICAL_CREAM | Freq: Every day | CUTANEOUS | Status: DC
  Administered 2016-05-15 – 2016-05-23 (×4): 1 via TOPICAL
  Filled 2016-05-15: qty 85

## 2016-05-15 MED ORDER — ACETAMINOPHEN 650 MG RE SUPP
650.0000 mg | RECTAL | Status: DC | PRN
  Filled 2016-05-15: qty 1

## 2016-05-15 MED ORDER — DONEPEZIL HCL 10 MG PO TABS
10.0000 mg | ORAL_TABLET | Freq: Every day | ORAL | Status: DC
  Administered 2016-05-15 – 2016-05-22 (×8): 10 mg via ORAL
  Filled 2016-05-15 (×9): qty 1

## 2016-05-15 MED ORDER — DEXTROSE 50 % IV SOLN
INTRAVENOUS | Status: AC
  Administered 2016-05-15: 25 mL
  Filled 2016-05-15: qty 50

## 2016-05-15 MED ORDER — SODIUM CHLORIDE 0.9 % IV SOLN
INTRAVENOUS | Status: DC
  Administered 2016-05-15: 125 mL via INTRAVENOUS
  Administered 2016-05-16 (×2): via INTRAVENOUS

## 2016-05-15 MED ORDER — LORAZEPAM 0.5 MG PO TABS
0.5000 mg | ORAL_TABLET | ORAL | Status: DC | PRN
  Administered 2016-05-15 – 2016-05-23 (×22): 0.5 mg via ORAL
  Filled 2016-05-15 (×22): qty 1

## 2016-05-15 MED ORDER — ALBUTEROL SULFATE (2.5 MG/3ML) 0.083% IN NEBU: 2.5000 mg | INHALATION_SOLUTION | RESPIRATORY_TRACT | Status: DC | PRN

## 2016-05-15 NOTE — Evaluation (Signed)
Clinical/Bedside Swallow Evaluation Patient Details  Name: Marisa [name] MRN: [id number] Date of Birth: 06-08-1940  Today's Date: 05/15/2016 Time: SLP Start Time (ACUTE ONLY): 1400 SLP Stop Time (ACUTE ONLY): 1414 SLP Time Calculation (min) (ACUTE ONLY): 14 min  Past Medical History:  Past Medical History:  Diagnosis Date  . Anemia   . Arthritis   . Asthma    Home 3L O2   . Chronic anemia   . Depression    patient feels depressed at the end of the month  . Diabetes mellitus type 2, insulin dependent ([place])    initial diagnoses 11/2008  . Diabetes mellitus without complication ([place])   . Diabetic ketoacidosis ([place]) 11/2010  . Diverticulosis   . Esophageal stricture 06/2004   Dilation 06/2004  . Family history of anesthesia complication    DAUGHTER HAS NAUSEA  . GERD (gastroesophageal reflux disease)   . history of  pericarditis 12/2002  . History of blood transfusion   . Hypertension   . Myocardial infarction    Chest pain s/p normal cath in 08/2004 then NSTEMI during 11/2010 admission, negative Myoview  . Osteoarthritis   . Oxygen deficiency   . Pericardial effusion   . Rectal bleeding 11/2010   Large hemorrhoids  . Rheumatoid arteritis   . Rheumatoid arthritis(714.0)    On MTX and chronic steroids  . Seizure ([place]) 11/2008  . Supraventricular tachycardia ([place])    Past Surgical History:  Past Surgical History:  Procedure Laterality Date  . ABDOMINAL HYSTERECTOMY  1980  . CHOLECYSTECTOMY    . COLONOSCOPY N/A 01/06/2013   Procedure: COLONOSCOPY;  Surgeon: Ladene Artist, MD;  Location: [facility] ENDOSCOPY;  Service: Endoscopy;  Laterality: N/A;  . ESOPHAGOGASTRODUODENOSCOPY N/A 02/11/2013   Procedure: ESOPHAGOGASTRODUODENOSCOPY (EGD);  Surgeon: Irene Shipper, MD;  Location: [facility] ENDOSCOPY;  Service: Endoscopy;  Laterality: N/A;  . INCISE AND DRAIN ABCESS  09/2011   I&D of peri-rectal abcess per Dr Zella Richer.    HPI:  Marisa [name] a 76 y.o.femalewith a Past Medical who  presents with acute brainstem infarct. Pts symptoms seem to come and go and is very sensitive to position and CNS hypoperfusion. Dissecting Type B aneurysm of thoracic aorta identified and vascular following. MRI shows Small acute/early subacute infarcts within the right paramedian midbrain, right occipital lobe, and bilateral cerebellar hemispheres.   Assessment / Plan / Recommendation Clinical Impression  Pt initially difficult to arouse, but tolerated upright position in bed briefly for SLP assessment and woke up once she was presented with PO. Pt consumed all textures without difficulty, took 3 oz consecutively from straw x2 during session. Recommend pt resume a regular diet and thin liquids. No SLP f/u needed for swallowing.     Aspiration Risk  Mild aspiration risk    Diet Recommendation Regular;Thin liquid   Liquid Administration via: Cup;Straw Medication Administration: Whole meds with liquid Supervision: Patient able to self feed Compensations: Slow rate;Small sips/bites Postural Changes: Seated upright at 90 degrees    Other  Recommendations     Follow up Recommendations None      Frequency and Duration            Prognosis        Swallow Study   General HPI: Marisa [name] a 76 y.o.femalewith a Past Medical who presents with acute brainstem infarct. Pts symptoms seem to come and go and is very sensitive to position and CNS hypoperfusion. Dissecting Type B aneurysm of thoracic aorta identified and vascular following. MRI  shows Small acute/early subacute infarcts within the right paramedian midbrain, right occipital lobe, and bilateral cerebellar hemispheres. Type of Study: Bedside Swallow Evaluation Previous Swallow Assessment: none Diet Prior to this Study: NPO Temperature Spikes Noted: No Respiratory Status: Room air History of Recent Intubation: No Behavior/Cognition: Distractible;Lethargic/Drowsy Oral Cavity Assessment: Within Functional Limits Oral Care  Completed by SLP: No Oral Cavity - Dentition: Adequate natural dentition Vision: Functional for self-feeding Self-Feeding Abilities: Able to feed self Patient Positioning: Upright in bed Baseline Vocal Quality: Normal Volitional Cough: Strong Volitional Swallow: Able to elicit    Oral/Motor/Sensory Function Overall Oral Motor/Sensory Function: Within functional limits   Ice Chips Ice chips: Within functional limits   Thin Liquid Thin Liquid: Within functional limits Presentation: Cup;Straw;Self Fed    Nectar Thick Nectar Thick Liquid: Not tested   Honey Thick Honey Thick Liquid: Not tested   Puree Puree: Within functional limits   Solid   GO   Solid: Within functional limits Presentation: Self Wintress Brys, MA CCC-SLP [phone]  Francile Woolford, Katherene Ponto 05/15/2016,2:27 PM

## 2016-05-15 NOTE — ED Notes (Signed)
Patient is stable and ready to be transport to the floor at this time.  Report was called to 4E RN.  Belongings taken with the patient to the floor.   

## 2016-05-15 NOTE — Care Management Note (Signed)
Case Management Note  Patient Details  Name: Marisa [name] MRN: [id number] Date of Birth: 1940/10/03  Subjective/Objective:  Presents with Brainstem infarct, DKA, Chronic Renal Insuff, Dissecting aneurysm of thoracic aorta type B, htn, chronic resp failure, RA, Gerd , PCCm and Iron def anemia.  Patient is from home with spouse, patient has dementia, husband was also admitted to hospital for palpitations, he is her caregiver.  Await pt/ot eval.  NCM will cont to follow for dc needs.                 Action/Plan:   Expected Discharge Date:                  Expected Discharge Plan:  Skilled Nursing Facility  In-House Referral:  Clinical Social Work  Discharge planning Services  CM Consult  Post Acute Care Choice:    Choice offered to:     DME Arranged:    DME Agency:     HH Arranged:    [place] Agency:     Status of Service:  In process, will continue to follow  If discussed at Long Length of Stay Meetings, dates discussed:    Additional Comments:  Zenon Mayo, RN 05/15/2016, 12:15 PM

## 2016-05-15 NOTE — Progress Notes (Signed)
Marisa [name] TEAM 1 - Stepdown/ICU TEAM  MEHGAN MURGIA  [id number] DOB: 09-27-1940 DOA: 05/14/2016 PCP: Jani Gravel, MD    Brief Narrative:  76 y.o. female with history of dementia, severe rheumatoid arthritis on chronic steroids and Arava, stage III chronic kidney disease, diabetes, hypertension, COPD O2 dependent on 2 L, protein calorie malnutrition, chronic anemia and GERD who was last seen normal prior to going to bed. She then awakened the morning of her admit with blurry vision in the right eye and the inability to see out of the left eye. She noted left-sided weakness as well.   In the ED a CT angio of the head and neck wasconsistent with a brainstem infarct.  She was also found to have a type B aortic dissection.  Subjective: The pt is resting comfortably in bed.  She is alert and conversant, though somewhat confused.  She can not tell me why she is here or what happened to her.  She denies cp, sob, n/v, or abdom pain.    Assessment & Plan:  Brainstem infarcts, acute  -Patient presents with significant visual acuity changes associated with abnormal eye movement and left-sided weakness with diagnostic evidence of brainstem infarct -Neurology following -B vertebral occlusions noted on CTa  Type B aortic dissection  -Incidental finding on CT angio of the neck -Vasc Surg following  -needs CT angio of the chest to further evaluate her dissection - hydrating and rechecking rnl fxn to assure stable/improved -Focus on blood pressure control avoiding significant hypertension  Uncontrolled DM2 -required insulin gtt this morning - no titrated off - follow w/ SSI only   CKD Stage 3  -baseline crt 1.3  Recent Labs Lab 05/14/16 1123 05/14/16 2230 05/15/16 0224 05/15/16 0429 05/15/16 1136  CREATININE 1.30* 1.79* 1.75* 1.65* 1.48*    HTN  -BP variable - follow and titrate meds as indicated  Chronic respiratory failure with hypoxemia / COPD  -Clinically stable on 3L - on home  O2 at 2 L/m -Continue nebs and MDI  Rheumatoid arthritis  -On prednisone 10 mg daily along with Arava - cont steroid tx - hold Arava for now   GERD  Protein calorie malnutrition  -May benefit from nutrition evaluation during the hospitalization  Iron deficiency anemia due to chronic blood loss -Baseline hemoglobin appears to be between 7.7 and 8.7 - stable   Recent Labs Lab 05/14/16 1111 05/14/16 1123  HGB 9.6* 9.2*    DVT prophylaxis: SCDs Code Status: FULL CODE Family Communication: spoke w/ son at bedside  Disposition Plan: remain in SDU - f/u CTa chest - possible aortic surgery this admit   Consultants:  Neurology  VVS  Procedures: none  Antimicrobials:  none   Objective: Blood pressure (!) 182/76, pulse 93, temperature 98.7 F (37.1 C), temperature source Oral, resp. rate 19, height 5' (1.524 m), weight 49.9 kg (110 lb), SpO2 96 %.  Intake/Output Summary (Last 24 hours) at 05/15/16 1439 Last data filed at 05/15/16 1400  Gross per 24 hour  Intake           458.35 ml  Output              250 ml  Net           208.35 ml   Filed Weights   05/14/16 1109  Weight: 49.9 kg (110 lb)    Examination: General: No acute respiratory distress Lungs: Clear to auscultation bilaterally without wheezes or crackles Cardiovascular: Regular rate and rhythm without murmur  gallop or rub normal S1 and S2 Abdomen: Nontender, nondistended, soft, bowel sounds positive, no rebound, no ascites, no appreciable mass Extremities: No significant cyanosis, clubbing, or edema bilateral lower extremities  CBC:  Recent Labs Lab 05/14/16 1111 05/14/16 1123  WBC 8.0  --   NEUTROABS 5.4  --   HGB 9.6* 9.2*  HCT 29.8* 27.0*  MCV 89.2  --   PLT 220  --    Basic Metabolic Panel:  Recent Labs Lab 05/14/16 1111 05/14/16 1123 05/14/16 2230 05/15/16 0224 05/15/16 0429 05/15/16 1136  NA 133* 130* 135 136 139 143  K 4.6 4.5 5.6* 4.5 4.1 3.8  CL 89* 86* 90* 95* 101 103  CO2 35*   --  21* 25 28 32  GLUCOSE 406* 393* 551* 443* 280* 59*  BUN 22* 24* 31* 32* 33* 33*  CREATININE 1.38* 1.30* 1.79* 1.75* 1.65* 1.48*  CALCIUM 9.0  --  9.0 8.5* 8.4* 8.5*   GFR: Estimated Creatinine Clearance: 23.6 mL/min (by C-G formula based on SCr of 1.48 mg/dL (H)).  Liver Function Tests:  Recent Labs Lab 05/14/16 1111  AST 15  ALT 13*  ALKPHOS 85  BILITOT 0.8  PROT 5.5*  ALBUMIN 3.4*    Coagulation Profile:  Recent Labs Lab 05/14/16 1111  INR 0.89   HbA1C: Hemoglobin A1C  Date/Time Value Ref Range Status  02/02/2016 6.8  Final   Hgb A1c MFr Bld  Date/Time Value Ref Range Status  05/14/2016 11:11 AM 7.1 (H) 4.8 - 5.6 % Final    Comment:    (NOTE)         Pre-diabetes: 5.7 - 6.4         Diabetes: >6.4         Glycemic control for adults with diabetes: <7.0   01/18/2016 04:38 PM 6.4 (H) 4.8 - 5.6 % Final    Comment:    (NOTE)         Pre-diabetes: 5.7 - 6.4         Diabetes: >6.4         Glycemic control for adults with diabetes: <7.0     CBG:  Recent Labs Lab 05/15/16 0843 05/15/16 0950 05/15/16 1054 05/15/16 1232 05/15/16 1307  GLUCAP 116* 108* 82 51* 183*    Recent Results (from the past 240 hour(s))  MRSA PCR Screening     Status: None   Collection Time: 05/15/16  1:18 AM  Result Value Ref Range Status   MRSA by PCR NEGATIVE NEGATIVE Final    Comment:        The GeneXpert MRSA Assay (FDA approved for NASAL specimens only), is one component of a comprehensive MRSA colonization surveillance program. It is not intended to diagnose MRSA infection nor to guide or monitor treatment for MRSA infections.      Scheduled Meds: . aspirin  300 mg Rectal Daily   Or  . aspirin  325 mg Oral Daily  . methylPREDNISolone (SOLU-MEDROL) injection  40 mg Intravenous Daily  . metoprolol  2.5 mg Intravenous Q8H  . MUSCLE RUB  1 application Topical Daily   Continuous Infusions: . sodium chloride 125 mL (05/15/16 1053)     LOS: 1 day    Cherene Altes, MD [facility] Office  [phone] Pager - Text Page per Amion as per below:  On-Call/Text Page:      [email or web address]      password [email or web address]  If 7PM-7AM, please contact night-coverage [email or web address] Password [facility] 05/15/2016, 2:39 PM

## 2016-05-15 NOTE — Evaluation (Signed)
Speech Language Pathology Evaluation Patient Details Name: Marisa [name] MRN: [id number] DOB: 02-24-1941 Today's Date: 05/15/2016 Time: [id number] SLP Time Calculation (min) (ACUTE ONLY): 14 min  Problem List:  Patient Active Problem List   Diagnosis Date Noted  . Brainstem infarct, acute ([place]) 05/14/2016  . Dissecting aneurysm of thoracic aorta, Stanford type B ([place]) 05/14/2016  . Diabetes mellitus with complication ([place])   . Rib fractures 01/18/2016  . Chronic renal insufficiency, stage 3 (moderate)   . Memory loss 05/09/2015  . Immunosuppressed status ([place])   . Shoulder effusion 05/28/2014  . Rheumatoid arthritis ([place]) 05/28/2014  . Insulin dependent type 2 diabetes mellitus, uncontrolled ([place]) 05/28/2014  . Symptomatic anemia 04/13/2014  . Iron deficiency anemia due to chronic blood loss 03/07/2013  . Chronic respiratory failure ([place]) 02/11/2013  . 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 Personal history of colonic polyps 01/05/2013  . Protein calorie malnutrition ([place]) 10/08/2011  . Diabetes mellitus type 2, insulin dependent ([place])   . Anxiety state 06/08/2007  . HTN (hypertension) 06/08/2007  . UNSPECIFIED ACUTE PERICARDITIS 06/08/2007  . SINUS ARRHYTHMIA 06/08/2007  . HEMORRHOIDS, INTERNAL 06/08/2007  . ALLERGIC RHINITIS 06/08/2007  . COPD (chronic obstructive pulmonary disease) ([place]) 06/08/2007  . ESOPHAGEAL STRICTURE 06/08/2007  . GERD 06/08/2007  . CONSTIPATION, CHRONIC 06/08/2007  . IRRITABLE BOWEL SYNDROME 06/08/2007  . SLEEP APNEA 06/08/2007  . HEADACHE, CHRONIC 06/08/2007  . NEOPLASM, MALIGNANT, CARCINOMA, BASAL CELL, NOSE 06/08/2007   Past Medical History:  Past Medical History:  Diagnosis Date  . Anemia   . Arthritis   . Asthma    Home 3L O2   . Chronic anemia   . Depression    patient feels depressed at the end of the month  . Diabetes mellitus type 2, insulin dependent ([place])    initial diagnoses 11/2008  . Diabetes mellitus without complication ([place])    . Diabetic ketoacidosis ([place]) 11/2010  . Diverticulosis   . Esophageal stricture 06/2004   Dilation 06/2004  . Family history of anesthesia complication    DAUGHTER HAS NAUSEA  . GERD (gastroesophageal reflux disease)   . history of  pericarditis 12/2002  . History of blood transfusion   . Hypertension   . Myocardial infarction    Chest pain s/p normal cath in 08/2004 then NSTEMI during 11/2010 admission, negative Myoview  . Osteoarthritis   . Oxygen deficiency   . Pericardial effusion   . Rectal bleeding 11/2010   Large hemorrhoids  . Rheumatoid arteritis   . Rheumatoid arthritis(714.0)    On MTX and chronic steroids  . Seizure ([place]) 11/2008  . Supraventricular tachycardia ([place])    Past Surgical History:  Past Surgical History:  Procedure Laterality Date  . ABDOMINAL HYSTERECTOMY  1980  . CHOLECYSTECTOMY    . COLONOSCOPY N/A 01/06/2013   Procedure: COLONOSCOPY;  Surgeon: Ladene Artist, MD;  Location: [facility] ENDOSCOPY;  Service: Endoscopy;  Laterality: N/A;  . ESOPHAGOGASTRODUODENOSCOPY N/A 02/11/2013   Procedure: ESOPHAGOGASTRODUODENOSCOPY (EGD);  Surgeon: Irene Shipper, MD;  Location[facility] ENDOSCOPY;  Service: Endoscopy;  Laterality: N/A;  . INCISE AND DRAIN ABCESS  09/2011   I&D of peri-rectal abcess per Dr Zella Richer.    HPI:  ZONDA SUNDIN a 76 y.o.femalewith a Past Medical who presents with acute brainstem infarct. Pts symptoms seem to come and go and is very sensitive to position and CNS hypoperfusion. Dissecting Type B aneurysm of thoracic aorta identified and vascular following. MRI shows Small acute/early subacute infarcts within the right paramedian midbrain, right  occipital lobe, and bilateral cerebellar hemispheres.   Assessment / Plan / Recommendation Clinical Impression  Pt demosntrated impaired cogntion, likely secondary to baseline dementia and waxing and waning arousal. Pt initially very hard to awaken, and did not immediately follow commands in session. When  alert, she intermittently followed verbal commands, often appearing to not attend to speaker. She participated more successfully at the conversation level, telling me about her living situation, orientation and other basic biographcial information and demonstrating ability to recall some simple information after 3 minute delay. Language and speech intelligibility are WNL , though cognition is impaired, though suspect this is part of pts baseline. Given that pt has appropriate level of assist at baseline from husband, do not recommend SLP f/u.     SLP Assessment  Patient does not need any further Speech Lanaguage Pathology Services    Follow Up Recommendations  None    Frequency and Duration           SLP Evaluation Cognition  Overall Cognitive Status: History of cognitive impairments - at baseline Arousal/Alertness: Lethargic Orientation Level: Oriented to person;Oriented to place;Oriented to time;Oriented to situation Attention: Focused;Sustained Focused Attention: Impaired Sustained Attention: Impaired Memory: Appears intact Problem Solving:  (NT)       Comprehension  Auditory Comprehension Overall Auditory Comprehension: Impaired Yes/No Questions: Not tested Commands: Impaired One Step Basic Commands: 25-49% accurate Conversation: Simple Interfering Components: Attention Reading Comprehension Reading Status: Not tested    Expression Verbal Expression Overall Verbal Expression: Appears within functional limits for tasks assessed   Oral / Motor  Oral Motor/Sensory Function Overall Oral Motor/Sensory Function: Within functional limits Motor Speech Overall Motor Speech: Appears within functional limits for tasks assessed   GO                   Herbie Baltimore, MA CCC-SLP [phone]  Marisa [name], [place] 05/15/2016, 2:42 PM

## 2016-05-15 NOTE — Progress Notes (Signed)
STROKE TEAM PROGRESS NOTE   HISTORY OF PRESENT ILLNESS (per record) Marisa [name] is an 76 y.o. female who has known dementia and lives with her husband. Apparently she was at baseline yesterday and went to sleep around 7:00. Per husband around 12:00 midnight patient woke up and noted that her vision was off. At 6 AM patient got up and to the bathroom but the husband didn't really look at her very closely all he could tell is that she was able to walk to the bathroom. She went back to bed and woke up around 9 and at this time noted that she was unable to walk by herself. Patient was brought to the emergency department where she was noted to have disconjugate gaze and limited extraocular movements. In addition she seemed weaker on the left. On examination patient has limited upward gaze bilaterally, left sixth nerve palsy, ability to immediately deviate her left eye and apparently no lateral gaze with her right eye. Face appears symmetrical but she does appear to have left-sided weakness. There is concern for possible basilar thrombosis. A stat CTA of head and neck were obtained. She was last known well 05/13/2016 at 18:00. Patient was not administered IV t-PA secondary to being out of window. She was admitted for further evaluation and treatment.   SUBJECTIVE (INTERVAL HISTORY) No family at bedside. Pt RN in room. Pt denies HA or double vision but she does have disconjugated eyes. Pt denies any "lazy eyes" in the past. She has passed swallow, on diet. She stated that she wants to go home.    OBJECTIVE Temp:  [98.3 F (36.8 C)-98.7 F (37.1 C)] 98.7 F (37.1 C) (01/31 1233) Pulse Rate:  [85-125] 96 (01/31 1233) Cardiac Rhythm: Normal sinus rhythm (01/31 0700) Resp:  [14-35] 22 (01/31 1233) BP: (118-204)/(51-105) 175/61 (01/31 1233) SpO2:  [93 %-100 %] 97 % (01/31 1233)  CBC:  Recent Labs Lab 05/14/16 1111 05/14/16 1123  WBC 8.0  --   NEUTROABS 5.4  --   HGB 9.6* 9.2*  HCT 29.8* 27.0*   MCV 89.2  --   PLT 220  --     Basic Metabolic Panel:  Recent Labs Lab 05/15/16 0224 05/15/16 0429  NA 136 139  K 4.5 4.1  CL 95* 101  CO2 25 28  GLUCOSE 443* 280*  BUN 32* 33*  CREATININE 1.75* 1.65*  CALCIUM 8.5* 8.4*    Lipid Panel:    Component Value Date/Time   CHOL 185 05/15/2016 0429   TRIG 123 05/15/2016 0429   HDL 79 05/15/2016 0429   CHOLHDL 2.3 05/15/2016 0429   VLDL 25 05/15/2016 0429   LDLCALC 81 05/15/2016 0429   HgbA1c:  Lab Results  Component Value Date   HGBA1C 7.1 (H) 05/14/2016   Urine Drug Screen:    Component Value Date/Time   LABOPIA NONE DETECTED 05/14/2016 1359   COCAINSCRNUR NONE DETECTED 05/14/2016 1359   LABBENZ POSITIVE (A) 05/14/2016 1359   AMPHETMU NONE DETECTED 05/14/2016 1359   THCU NONE DETECTED 05/14/2016 1359   LABBARB NONE DETECTED 05/14/2016 1359      IMAGING I have personally reviewed the radiological images below and agree with the radiology interpretations.  Ct Head Wo Contrast 05/14/2016 1. No acute intracranial findings. 2. Chronic atrophy and white matter microvascular disease. 3. Remote cerebellar infarct.   Ct Angio Head W Or Wo Contrast Ct Angio Neck W And/or Wo Contrast 05/14/2016 1. Partially visualized type B aortic dissection. 2. No major intracranial arterial  occlusion. Mild intracranial right ICA stenosis. 3. Partial occlusion of the right V3 segment with distal reconstitution. 4. Severe osseous compression of the left vertebral artery at the C3 level versus short segment occlusion secondary to chronic malalignment and osseous deformities, most likely related to remote trauma. 5. Mild narrowing of the left V3 segment compatible with short segment dissection, also potentially related to prior trauma although an acute dissection cannot be entirely excluded given the lack of prior angiographic studies for comparison. 6. No cervical carotid artery stenosis.   Mr Brain Limited Wo Contrast 05/14/2016  1. Small  acute/early subacute infarcts within the right paramedian midbrain, right occipital lobe, and bilateral cerebellar hemispheres.   Mr Jodene Nam Head Wo Contrast 05/14/2016 Severe motion artifact of time-of-flight MRA. No new proximal large vessel occlusion identified.   2D Echocardiogram  - Left ventricle: The cavity size was normal. Wall thickness was normal. Systolic function was normal. The estimated ejection fraction was in the range of 60% to 65%. Wall motion was normal; there were no regional wall motion abnormalities. Features are consistent with a pseudonormal left ventricular filling pattern, with concomitant abnormal relaxation and increased filling pressure (grade 2 diastolic dysfunction). - Aortic valve: Valve area (VTI): 1.72 cm^2. Valve area (Vmax): 1.23 cm^2. Valve area (Vmean): 1.61 cm^2. - Mitral valve: Mildly calcified annulus. Mildly thickened leaflets. There was mild regurgitation. - Left atrium: The atrium was mildly dilated.   PHYSICAL EXAM  Temp:  [98.3 F (36.8 C)-98.8 F (37.1 C)] 98.8 F (37.1 C) (01/31 1500) Pulse Rate:  [73-125] 99 (01/31 1500) Resp:  [10-35] 16 (01/31 1500) BP: (120-204)/(40-105) 181/91 (01/31 1500) SpO2:  [82 %-100 %] 100 % (01/31 1500)  General - Well nourished, well developed, in no apparent distress, but anxious asking to go home.  Ophthalmologic - Fundi not visualized due to noncooperation.  Cardiovascular - Regular rate and rhythm.  Mental Status -  Level of arousal and orientation to time, self, age but not orientated to place. Language including expression, naming, repetition, comprehension was assessed and found intact, however, mild dysarthria.  Cranial Nerves II - XII - II - Visual field intact OU. III, IV, VI - Extraocular movements exam showed right gaze with incomplete right eye abduction, on left gaze, left eye with left nystagmus, and right eye with adduction impairment V - Facial sensation intact bilaterally. VII - Facial  movement intact bilaterally. VIII - Hearing & vestibular intact bilaterally. X - Palate elevates symmetrically. XI - Chin turning & shoulder shrug intact bilaterally. XII - Tongue protrusion intact.  Motor Strength - The patient's strength was 4/5 in all extremities and pronator drift was absent.  Bulk was normal and fasciculations were absent.   Motor Tone - Muscle tone was assessed at the neck and appendages and was normal.  Reflexes - The patient's reflexes were 1+ in all extremities and she had no pathological reflexes.  Sensory - Light touch, temperature/pinprick were assessed and were symmetrical.    Coordination - The patient had normal movements in the hands with no ataxia or dysmetria.  Tremor was absent.  Gait and Station - deferred   ASSESSMENT/PLAN Marisa [name] is a 76 y.o. 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 She did not receive IV t-PA due to delay  in arrival.   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 The occlusions, however, more chronic appearence with distal reconstitution  Resultant  disconjugate gaze  MRI  right paramedian midbrain, right occipital lobe and bilateral cerebellar punctate infarct  MRA head no large vessel occlusion  CTA head and neck - Type B aortic dissection. No large artery occlusion. Partial occlusion R V3. Severe osseous compression LVA likely d/t remote trauma.   2D Echo  EF 60-5%. No source of embolus   LDL 81  HgbA1c 7.1  SCDs for VTE prophylaxis  Diet NPO time  specified  No antithrombotic prior to admission, now on aspirin 300 mg suppository daily. Recommend DAPT for 3 months and then plavix alone.   Patient and husband counseled to be compliant with her antithrombotic medications  Ongoing aggressive stroke risk factor management  Therapy recommendations:  SNF  Disposition:  pending   Hypertension  Stable  Permissive hypertension (OK if < 220/120) but gradually normalize in 5-7 days  Long-term BP goal normotensive  Hyperlipidemia  Home meds:  No statin  LDL 81, goal < 70  Add low dose pravastatin  Continue statin at discharge  Diabetes type 2  HgbA1c 7.1, goal < 7.0  Hyperglycemia  CBG not controlled  On levemir  SSI  Other Stroke Risk Factors  Advanced age  Family hx stroke (father)  Coronary artery disease s/p MI  Other Active Problems  Baseline dementia - on aricept  CKD, stage III  Dissecting aneurysm, thoracic aorta, Stanford type B. Incidental finding on CT angiogram of the neck. VVS consult Scot Dock). Plan CT scan chest/abd/pelvis this admission.  Chronic respiratory failure with hypoxemia/COPD. Oxygen dependent  Rheumatoid arthritis on prednisone along with Arava  Iron deficiency anemia due to chronic blood loss, baseline hemoglobin 7.7-8.7, this admission 9.6-9.2  Hospital day # 1  Rosalin Hawking, MD PhD Stroke Neurology 05/15/2016 4:23 PM   To contact Stroke Continuity provider, please refer to [email or web address] After hours, contact General Neurology

## 2016-05-15 NOTE — Progress Notes (Signed)
Hypoglycemic Event  CBG: 51  Treatment: D50 IV 25 mL  Symptoms: None  Follow-up CBG: Time:1258 CBG Result:183  Possible Reasons for Event: Medication regimen: insulin drip   Comments/MD notified:Dr Mcclung    Marisa [name]

## 2016-05-15 NOTE — Evaluation (Signed)
Physical Therapy Evaluation Patient Details Name: Marisa [name] MRN: [id number] DOB: February 01, 1941 Today's Date: 05/15/2016   History of Present Illness  Pt is a 76 y/o female admitted secondary to visual abnormalties and L sided weakness found to have a brainstem infarct. PMH including but not limited to dementia, DM, HTN, RA and NSTEMI in 2012.  Clinical Impression  Pt presented supine in bed, with HOB flat, awake and willing to participate in therapy session. Pt with cognitive deficits at baseline (dementia). No family members or caregivers were present to provide reliable information. Prior to admission pt reported that she ambulated with use of a SPC and required supplemental O2. Pt performed bed mobility with min A and tolerated sitting EOB for ~5 minutes with close min guard for safety; however, after 5 minutes pt requesting to lie back down as she reported un-resolving dizziness. Pt's BP was measured after pt returned to supine with systolic BP at 0000000. All other VSS throughout. Pt would continue to benefit from skilled physical therapy services at this time while admitted and after d/c to address her below listed limitations in order to improve her overall safety and independence with functional mobility.      Follow Up Recommendations SNF;Supervision/Assistance - 24 hour    Equipment Recommendations  None recommended by PT    Recommendations for Other Services       Precautions / Restrictions Precautions Precautions: Fall Restrictions Weight Bearing Restrictions: No      Mobility  Bed Mobility Overal bed mobility: Needs Assistance Bed Mobility: Supine to Sit;Sit to Supine     Supine to sit: Min assist Sit to supine: Min assist   General bed mobility comments: pt required increased time, min A with trunk. HOB flat throughout.  Transfers                 General transfer comment: unable to perform at this time as pt was very dizzy with sitting  EOB  Ambulation/Gait                Stairs            Wheelchair Mobility    Modified Rankin (Stroke Patients Only) Modified Rankin (Stroke Patients Only) Pre-Morbid Rankin Score: Moderately severe disability Modified Rankin: Severe disability     Balance Overall balance assessment: Needs assistance Sitting-balance support: Bilateral upper extremity supported Sitting balance-Leahy Scale: Poor Sitting balance - Comments: pt reliant on bilateral UE supports on bed with close min guard for safety. Pt tolerated sitting EOB for approximately 5 minutes with reported dizziness and request to lie back down                                     Pertinent Vitals/Pain Pain Assessment: No/denies pain    Home Living Family/patient expects to be discharged to:: Unsure                 Additional Comments: Pt lives at home with her husband who is her primary caregiver. Pt with dementia at baseline with no caregivers or family members present during evaluation.    Prior Function Level of Independence: Needs assistance   Gait / Transfers Assistance Needed: ambulating mod I with cane           Hand Dominance        Extremity/Trunk Assessment   Upper Extremity Assessment Upper Extremity Assessment: Overall WFL for tasks assessed  Lower Extremity Assessment Lower Extremity Assessment: Overall WFL for tasks assessed       Communication   Communication: No difficulties  Cognition Arousal/Alertness: Awake/alert Behavior During Therapy: WFL for tasks assessed/performed Overall Cognitive Status: History of cognitive impairments - at baseline                 General Comments: pt with dementia at baseline    General Comments      Exercises     Assessment/Plan    PT Assessment Patient needs continued PT services  PT Problem List Decreased strength;Decreased activity tolerance;Decreased balance;Decreased mobility;Decreased  coordination;Decreased cognition;Decreased knowledge of use of DME;Decreased safety awareness;Decreased knowledge of precautions          PT Treatment Interventions DME instruction;Gait training;Stair training;Functional mobility training;Therapeutic activities;Therapeutic exercise;Balance training;Neuromuscular re-education;Patient/family education;Cognitive remediation    PT Goals (Current goals can be found in the Care Plan section)  Acute Rehab PT Goals Patient Stated Goal: none stated PT Goal Formulation: Patient unable to participate in goal setting Time For Goal Achievement: 05/29/16 Potential to Achieve Goals: Fair    Frequency Min 4X/week   Barriers to discharge        Co-evaluation               End of Session Equipment Utilized During Treatment: Oxygen Activity Tolerance: Patient limited by fatigue;Other (comment) (dizziness in sitting) Patient left: in bed;with call bell/phone within reach;with bed alarm set;with nursing/sitter in room Nurse Communication: Mobility status         Time: [id number] PT Time Calculation (min) (ACUTE ONLY): 12 min   Charges:   PT Evaluation $PT Eval Moderate Complexity: 1 Procedure     PT G CodesClearnce Sorrel Fabianna Keats 05/15/2016, 1:05 PM Sherie Don, [place], DPT [phone]

## 2016-05-15 NOTE — Progress Notes (Signed)
Inpatient Diabetes Program Recommendations  AACE/ADA: New Consensus Statement on Inpatient Glycemic Control (2015)  Target Ranges:  Prepandial:   less than 140 mg/dL      Peak postprandial:   less than 180 mg/dL (1-2 hours)      Critically ill patients:  140 - 180 mg/dL   Lab Results  Component Value Date   GLUCAP 183 (H) 05/15/2016   HGBA1C 7.1 (H) 05/14/2016    Review of Glycemic ControlResults for SAVILLA, LINEMAN (MRN [id number]) as of 05/15/2016 15:10  Ref. Range 05/15/2016 08:43 05/15/2016 09:50 05/15/2016 10:54 05/15/2016 12:32 05/15/2016 13:07  Glucose-Capillary Latest Ref Range: 65 - 99 mg/dL 116 (H) 108 (H) 82 51 (L) 183 (H)    Diabetes history: Type 2 diabetes Outpatient Diabetes medications: Levemir 16 units daily, Novolog sensitive tid with meals, Novolog 5 units tid with meals Current orders for Inpatient glycemic control:  None  Inpatient Diabetes Program Recommendations:   Please consider restarting Novolog sensitive tid with meals and consider restarting Levemir 8 units daily on 05/16/16.   Thanks, Adah Perl, RN, BC-ADM Inpatient Diabetes Coordinator Pager [phone] (8a-5p)

## 2016-05-15 NOTE — Progress Notes (Signed)
  Echocardiogram 2D Echocardiogram has been performed.  Marisa [name] 05/15/2016, 10:13 AM

## 2016-05-15 NOTE — Progress Notes (Addendum)
   VASCULAR SURGERY ASSESSMENT & PLAN:  CT of chest/ abdomen/ pelvis to w/u Type B thoracic aortic dissection (likely chronic) is pending. Her crt is up to 1.65. However, needs to have the scan this admission.  SUBJECTIVE: Resting comfortably.  PHYSICAL EXAM: Vitals:   05/15/16 0300 05/15/16 0318 05/15/16 0400 05/15/16 0500  BP: 122/60 122/60 (!) 125/51 121/71  Pulse: 87 (!) 107 85 96  Resp: (!) 23 19 (!) 22 (!) 24  Temp:  98.3 F (36.8 C)    TempSrc:  Oral    SpO2: 100% 100% 100% 100%  Weight:      Height:       No change in exam  LABS: Lab Results  Component Value Date   WBC 8.0 05/14/2016   HGB 9.2 (L) 05/14/2016   HCT 27.0 (L) 05/14/2016   MCV 89.2 05/14/2016   PLT 220 05/14/2016   Lab Results  Component Value Date   CREATININE 1.65 (H) 05/15/2016   Lab Results  Component Value Date   INR 0.89 05/14/2016   CBG (last 3)   Recent Labs  05/15/16 0322 05/15/16 0430 05/15/16 0532  GLUCAP 391* 310* 207*    Principal Problem:   Brainstem infarct, acute (HCC) Active Problems:   HTN (hypertension)   COPD (chronic obstructive pulmonary disease) (HCC)   GERD   Protein calorie malnutrition (HCC)   Chronic respiratory failure (HCC)   Iron deficiency anemia due to chronic blood loss   Rheumatoid arthritis (HCC)   Insulin dependent type 2 diabetes mellitus, uncontrolled (HCC)   Chronic renal insufficiency, stage 3 (moderate)   Dissecting aneurysm of thoracic aorta, Stanford type B ([place])    Marisa [name] [id number] 05/15/2016

## 2016-05-16 ENCOUNTER — Other Ambulatory Visit: Payer: Self-pay | Admitting: *Deleted

## 2016-05-16 DIAGNOSIS — I71019 Dissection of thoracic aorta, unspecified: Secondary | ICD-10-CM

## 2016-05-16 DIAGNOSIS — I7101 Dissection of thoracic aorta: Secondary | ICD-10-CM

## 2016-05-16 LAB — GLUCOSE, CAPILLARY
GLUCOSE-CAPILLARY: 153 mg/dL — AB (ref 65–99)
GLUCOSE-CAPILLARY: 65 mg/dL (ref 65–99)
GLUCOSE-CAPILLARY: 136 mg/dL — AB (ref 65–99)
GLUCOSE-CAPILLARY: 51 mg/dL — AB (ref 65–99)
Glucose-Capillary: 67 mg/dL (ref 65–99)
Glucose-Capillary: 56 mg/dL — ABNORMAL LOW (ref 65–99)
Glucose-Capillary: 33 mg/dL — CL (ref 65–99)

## 2016-05-16 LAB — BASIC METABOLIC PANEL
ANION GAP: 12 (ref 5–15)
BUN: 17 mg/dL (ref 6–20)
CALCIUM: 7.6 mg/dL — AB (ref 8.9–10.3)
CO2: 23 mmol/L (ref 22–32)
CREATININE: 1.1 mg/dL — AB (ref 0.44–1.00)
Chloride: 102 mmol/L (ref 101–111)
GFR calc Af Amer: 55 mL/min — ABNORMAL LOW (ref >60)
GFR, EST NON AFRICAN AMERICAN: 48 mL/min — AB (ref >60)
Glucose, Bld: 142 mg/dL — ABNORMAL HIGH (ref 65–99)
Potassium: 3.5 mmol/L (ref 3.5–5.1)
SODIUM: 137 mmol/L (ref 135–145)

## 2016-05-16 LAB — COMPREHENSIVE METABOLIC PANEL
ALBUMIN: 3.3 g/dL — AB (ref 3.5–5.0)
ALT: 19 U/L (ref 14–54)
ANION GAP: 10 (ref 5–15)
AST: 55 U/L — AB (ref 15–41)
Alkaline Phosphatase: 81 U/L (ref 38–126)
BUN: 23 mg/dL — AB (ref 6–20)
CHLORIDE: 103 mmol/L (ref 101–111)
CO2: 27 mmol/L (ref 22–32)
Calcium: 7.9 mg/dL — ABNORMAL LOW (ref 8.9–10.3)
Creatinine, Ser: 1.12 mg/dL — ABNORMAL HIGH (ref 0.44–1.00)
GFR calc Af Amer: 54 mL/min — ABNORMAL LOW (ref >60)
GFR, EST NON AFRICAN AMERICAN: 47 mL/min — AB (ref >60)
GLUCOSE: 123 mg/dL — AB (ref 65–99)
POTASSIUM: 5.6 mmol/L — AB (ref 3.5–5.1)
Sodium: 140 mmol/L (ref 135–145)
Total Bilirubin: 1.2 mg/dL (ref 0.3–1.2)
Total Protein: 5.1 g/dL — ABNORMAL LOW (ref 6.5–8.1)

## 2016-05-16 LAB — CBC
HCT: 26.4 % — ABNORMAL LOW (ref 36.0–46.0)
Hemoglobin: 8.7 g/dL — ABNORMAL LOW (ref 12.0–15.0)
MCH: 28.9 pg (ref 26.0–34.0)
MCHC: 33 g/dL (ref 30.0–36.0)
MCV: 87.7 fL (ref 78.0–100.0)
PLATELETS: 222 10*3/uL (ref 150–400)
RBC: 3.01 MIL/uL — AB (ref 3.87–5.11)
RDW: 14 % (ref 11.5–15.5)
WBC: 15.6 10*3/uL — ABNORMAL HIGH (ref 4.0–10.5)

## 2016-05-16 LAB — C DIFFICILE QUICK SCREEN W PCR REFLEX
C DIFFICILE (CDIFF) INTERP: NOT DETECTED
C DIFFICILE (CDIFF) TOXIN: NEGATIVE
C Diff antigen: NEGATIVE

## 2016-05-16 LAB — HEMOGLOBIN A1C
Hgb A1c MFr Bld: 7.1 % — ABNORMAL HIGH (ref 4.8–5.6)
MEAN PLASMA GLUCOSE: 157 mg/dL

## 2016-05-16 MED ORDER — POTASSIUM CHLORIDE CRYS ER 20 MEQ PO TBCR: 20.0000 meq | EXTENDED_RELEASE_TABLET | Freq: Once | ORAL | Status: DC

## 2016-05-16 NOTE — Progress Notes (Signed)
Pt's dinner blood sugar was 56.  Patient was given 4 oz of regular soda.  Will recheck CBG in 15 minutes.  Will continue to monitor.

## 2016-05-16 NOTE — Progress Notes (Signed)
   VASCULAR SURGERY ASSESSMENT & PLAN:  I have reviewed the CT angiogram of the chest and abdomen. The area of concern on the proximal descending thoracic aorta is fairly localized and appears to be a localized dissection versus a penetrating ulcer. I will arrange a follow up CT scan of the chest in 6 months and a follow up visit at that time.  Vascular surgery will be available as needed.  SUBJECTIVE: No complaints.  PHYSICAL EXAM: Vitals:   05/15/16 2244 05/16/16 0302 05/16/16 0744 05/16/16 1216  BP: (!) 186/105 (!) 135/95    Pulse: (!) 104 100  94  Resp: 20 14  20   Temp: 97.7 F (36.5 C) 97.7 F (36.5 C) 98.8 F (37.1 C)   TempSrc: Oral Oral Oral   SpO2: 96% 98%  100%  Weight:      Height:       Lungs clear.  LABS: Lab Results  Component Value Date   WBC 15.6 (H) 05/16/2016   HGB 8.7 (L) 05/16/2016   HCT 26.4 (L) 05/16/2016   MCV 87.7 05/16/2016   PLT 222 05/16/2016   Lab Results  Component Value Date   CREATININE 1.12 (H) 05/16/2016   Lab Results  Component Value Date   INR 0.89 05/14/2016   CBG (last 3)   Recent Labs  05/15/16 2109 05/16/16 0750 05/16/16 1300  GLUCAP 294* 153* 67    Principal Problem:   Brainstem infarct, acute (HCC) Active Problems:   HTN (hypertension)   COPD (chronic obstructive pulmonary disease) (HCC)   GERD   Protein calorie malnutrition (HCC)   Chronic respiratory failure (HCC)   Iron deficiency anemia due to chronic blood loss   Rheumatoid arthritis (HCC)   Insulin dependent type 2 diabetes mellitus, uncontrolled (HCC)   Chronic renal insufficiency, stage 3 (moderate)   Aortic dissection ([place])   Stroke [facility])   Diplopia    Gae Gallop [id number] 05/16/2016

## 2016-05-16 NOTE — Progress Notes (Signed)
OT Cancellation Note  Patient Details Name: Marisa [name] MRN: [id number] DOB: 12-16-1940   Cancelled Treatment:    Reason Eval/Treat Not Completed: Fatigue/lethargy limiting ability to participate (Will continue to follow.)  Malka So 05/16/2016, 9:31 AM

## 2016-05-16 NOTE — Progress Notes (Signed)
Physical Therapy Treatment Patient Details Name: BRAYA STIVER MRN: [id number] DOB: 1940/07/23 Today's Date: 05/16/2016    History of Present Illness Pt is a 76 y/o female admitted secondary to visual abnormalties and L sided weakness found to have a brainstem infarct. PMH including but not limited to dementia, DM, HTN, RA and NSTEMI in 2012.    PT Comments    Pt presented supine in bed with HOB elevated, awake and willing to participate in therapy session. Pt making progress this session and was able to tolerate standing with max A x2 and transfers with mod-max A x2. Pt would continue to benefit from skilled physical therapy services at this time while admitted and after d/c to address her limitations in order to improve her overall safety and independence with functional mobility.    Follow Up Recommendations  SNF;Supervision/Assistance - 24 hour     Equipment Recommendations  None recommended by PT    Recommendations for Other Services       Precautions / Restrictions Precautions Precautions: Fall Restrictions Weight Bearing Restrictions: No    Mobility  Bed Mobility Overal bed mobility: Needs Assistance Bed Mobility: Supine to Sit;Sit to Supine     Supine to sit: Mod assist Sit to supine: Max assist;+2 for physical assistance   General bed mobility comments: pt required increased time, mod A at bilateral LEs and trunk to achieve sitting EOB with use of bed pads to position hips. Max A x2 to return to supine from sitting.  Transfers Overall transfer level: Needs assistance Equipment used: 2 person hand held assist Transfers: Sit to/from [facility] Sit to Stand: Mod assist;+2 physical assistance Stand pivot transfers: Max assist;+2 physical assistance       General transfer comment: pt required mod A x2 to power up into standing from bed x1 and from [facility] x1. Pt required max A x2 with pivotal movement.  Ambulation/Gait                  Stairs            Wheelchair Mobility    Modified Rankin (Stroke Patients Only)       Balance Overall balance assessment: Needs assistance Sitting-balance support: Bilateral upper extremity supported Sitting balance-Leahy Scale: Poor Sitting balance - Comments: pt reliant on bilateral UE supports on bed with close min guard for safety.    Standing balance support: Bilateral upper extremity supported Standing balance-Leahy Scale: Zero Standing balance comment: Max A x2 to maintain upright standing                    Cognition Arousal/Alertness: Awake/alert Behavior During Therapy: Anxious Overall Cognitive Status: History of cognitive impairments - at baseline                 General Comments: pt with dementia at baseline    Exercises      General Comments        Pertinent Vitals/Pain Pain Assessment: No/denies pain    Home Living                      Prior Function            PT Goals (current goals can now be found in the care plan section) Acute Rehab PT Goals Patient Stated Goal: none stated PT Goal Formulation: Patient unable to participate in goal setting Time For Goal Achievement: 05/29/16 Potential to Achieve Goals: Fair Progress towards PT goals: Progressing toward  goals    Frequency    Min 4X/week      PT Plan Current plan remains appropriate    Co-evaluation PT/OT/SLP Co-Evaluation/Treatment: Yes Reason for Co-Treatment: For patient/therapist safety;To address functional/ADL transfers PT goals addressed during session: Mobility/safety with mobility;Balance;Proper use of DME       End of Session Equipment Utilized During Treatment: Gait belt;Oxygen Activity Tolerance: Patient limited by fatigue Patient left: in bed;with call bell/phone within reach;with bed alarm set     Time: 1140-1203 PT Time Calculation (min) (ACUTE ONLY): 23 min  Charges:  $Therapeutic Activity: 8-22 mins                     G CodesClearnce Sorrel Chirstina Haan 2016-05-25, 12:16 PM Sherie Don, [place], DPT [phone]

## 2016-05-16 NOTE — Progress Notes (Signed)
STROKE TEAM PROGRESS NOTE   SUBJECTIVE (INTERVAL HISTORY) Husband is at bedside. Pt more awake, alert and not in distress. Pt denies HA or double vision but she does have disconjugated eyes. PT/OT recommend SNF. Passed swallow.   OBJECTIVE Temp:  [97.7 F (36.5 C)-99 F (37.2 C)] 97.8 F (36.6 C) (02/01 2023) Pulse Rate:  [90-104] 94 (02/01 2023) Cardiac Rhythm: Normal sinus rhythm (02/01 2023) Resp:  [14-20] 20 (02/01 2023) BP: (135-190)/(68-105) 160/68 (02/01 2023) SpO2:  [91 %-100 %] 100 % (02/01 2023)  CBC:   Recent Labs Lab 05/14/16 1111 05/14/16 1123 05/16/16 0720  WBC 8.0  --  15.6*  NEUTROABS 5.4  --   --   HGB 9.6* 9.2* 8.7*  HCT 29.8* 27.0* 26.4*  MCV 89.2  --  87.7  PLT 220  --  [id number]    Basic Metabolic Panel:   Recent Labs Lab 05/16/16 0330 05/16/16 1502  NA 140 137  K 5.6* 3.5  CL 103 102  CO2 27 23  GLUCOSE 123* 142*  BUN 23* 17  CREATININE 1.12* 1.10*  CALCIUM 7.9* 7.6*    Lipid Panel:     Component Value Date/Time   CHOL 185 05/15/2016 0429   TRIG 123 05/15/2016 0429   HDL 79 05/15/2016 0429   CHOLHDL 2.3 05/15/2016 0429   VLDL 25 05/15/2016 0429   LDLCALC 81 05/15/2016 0429   HgbA1c:  Lab Results  Component Value Date   HGBA1C 7.1 (H) 05/15/2016   Urine Drug Screen:     Component Value Date/Time   LABOPIA NONE DETECTED 05/14/2016 1359   COCAINSCRNUR NONE DETECTED 05/14/2016 1359   LABBENZ POSITIVE (A) 05/14/2016 1359   AMPHETMU NONE DETECTED 05/14/2016 1359   THCU NONE DETECTED 05/14/2016 1359   LABBARB NONE DETECTED 05/14/2016 1359      IMAGING I have personally reviewed the radiological images below and agree with the radiology interpretations.  Ct Head Wo Contrast 05/14/2016 1. No acute intracranial findings. 2. Chronic atrophy and white matter microvascular disease. 3. Remote cerebellar infarct.   Ct Angio Head W Or Wo Contrast Ct Angio Neck W And/or Wo Contrast 05/14/2016 1. Partially visualized type B aortic  dissection. 2. No major intracranial arterial occlusion. Mild intracranial right ICA stenosis. 3. Partial occlusion of the right V3 segment with distal reconstitution. 4. Severe osseous compression of the left vertebral artery at the C3 level versus short segment occlusion secondary to chronic malalignment and osseous deformities, most likely related to remote trauma. 5. Mild narrowing of the left V3 segment compatible with short segment dissection, also potentially related to prior trauma although an acute dissection cannot be entirely excluded given the lack of prior angiographic studies for comparison. 6. No cervical carotid artery stenosis.   Mr Brain Limited Wo Contrast 05/14/2016  1. 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   Mr Jodene Nam Head Wo Contrast 05/14/2016 Severe motion artifact of time-of-flight MRA. No new proximal large vessel occlusion identified.   2D Echocardiogram  - Left ventricle: The cavity size was normal. Wall thickness was normal. Systolic function was normal. The estimated ejection fraction was in the range of 60% to 65%. Wall motion was normal; there were no regional wall motion abnormalities. Features are consistent with a pseudonormal left ventricular filling pattern, with concomitant abnormal relaxation and increased filling pressure (grade 2 diastolic dysfunction). - Aortic valve: Valve area (VTI): 1.72 cm^2. Valve area (Vmax): 1.23 cm^2. Valve area (Vmean): 1.61  cm^2. - Mitral valve: Mildly calcified annulus. Mildly thickened leaflets. There was mild regurgitation. - Left atrium: The atrium was mildly dilated.   PHYSICAL EXAM  Temp:  [97.7 F (36.5 C)-99 F (37.2 C)] 97.8 F (36.6 C) (02/01 2023) Pulse Rate:  [90-104] 94 (02/01 2023) Resp:  [14-20] 20 (02/01 2023) BP: (135-190)/(68-105) 160/68 (02/01 2023) SpO2:  [91 %-100 %] 100 % (02/01 2023)  General - Well  nourished, well developed, in no apparent distress, but anxious asking to go home.  Ophthalmologic - Fundi not visualized due to noncooperation.  Cardiovascular - Regular rate and rhythm.  Mental Status -  Level of arousal and orientation to time, self, age, and place. Language including expression, naming, repetition, comprehension was assessed and found intact, however, mild dysarthria.  Cranial Nerves II - XII - II - Visual field intact OU. III, IV, VI - Extraocular movements exam showed right gaze with incomplete right eye abduction, on left gaze, left eye with left nystagmus, and right eye with adduction impairment V - Facial sensation intact bilaterally. VII - Facial movement intact bilaterally. VIII - Hearing & vestibular intact bilaterally. X - Palate elevates symmetrically. XI - Chin turning & shoulder shrug intact bilaterally. XII - Tongue protrusion intact.  Motor Strength - The patient's strength was 4/5 in all extremities and pronator drift was absent.  Bulk was normal and fasciculations were absent.   Motor Tone - Muscle tone was assessed at the neck and appendages and was normal.  Reflexes - The patient's reflexes were 1+ in all extremities and she had no pathological reflexes.  Sensory - Light touch, temperature/pinprick were assessed and were symmetrical.    Coordination - The patient had normal movements in the hands with no ataxia or dysmetria.  Tremor was absent.  Gait and Station - deferred   ASSESSMENT/PLAN Marisa [name] is a 76 y.o. 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 She did not receive IV t-PA due to delay in arrival.   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 The occlusions, however, more chronic appearence with distal reconstitution  Resultant  disconjugate gaze  MRI  right paramedian midbrain, right occipital lobe and bilateral cerebellar punctate infarct  MRA head no large vessel occlusion  CTA head and neck - Type B aortic dissection. No large artery occlusion. Partial occlusion R V3. Severe osseous compression LVA likely d/t remote trauma.   2D Echo  EF 60-5%. No source of embolus   LDL 81  HgbA1c 7.1  SCDs for VTE prophylaxis Diet regular Room service appropriate? Yes; Fluid consistency: Thin  No antithrombotic prior to admission, now on DAPT with ASA 325 mg and plavix. Recommend DAPT for 3 months and then plavix alone.   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   Hypertension  Stable  Permissive hypertension (OK if < 220/120) but gradually  normalize in 5-7 days  Long-term BP goal normotensive  Hyperlipidemia  Home meds:  No statin  LDL 81, goal < 70  Add low dose pravastatin  Continue statin at discharge  Diabetes type 2  HgbA1c 7.1, goal < 7.0  Hyperglycemia  CBG not controlled  On levemir  SSI  Other Stroke Risk Factors  Advanced age  Family hx stroke (father)  Coronary artery disease s/p MI  Other Active Problems  Baseline dementia - on aricept  CKD, stage III  Chronic respiratory failure with hypoxemia/COPD. Oxygen dependent  Rheumatoid arthritis on prednisone along with Arava  Iron deficiency anemia due to  chronic blood loss, baseline hemoglobin 7.7-8.7, this admission 9.6-9.2  Hospital day # 2  Neurology will sign off. Please call with questions. Pt will follow up with Dr. Jaynee Eagles at [facility] in about 6 weeks. Thanks for the consult.   Marisa Hawking, MD PhD Stroke Neurology 05/16/2016 8:37 PM   To contact Stroke Continuity provider, please refer to [email or web address] After hours, contact General Neurology

## 2016-05-16 NOTE — Evaluation (Signed)
Occupational Therapy Evaluation Patient Details Name: Marisa [name] MRN: [id number] DOB: [birth year] Today's Date: 05/16/2016    History of Present Illness Pt is a 76 y/o female admitted secondary to visual abnormalties and L sided weakness found to have a brainstem infarct. PMH including but not limited to dementia, DM, HTN, RA and NSTEMI in 2012.   Clinical Impression   Pt was dependent in bathing, dressing and all IADL prior to admission. She reports walking with a quad cane and could self feed and do some grooming tasks. Pt presents with impaired vision, decreased sitting and zero standing balance. She requires 2 person assist to transfer. Pt reports dizziness with L side lean observed in sitting and standing. Will follow acutely. Pt will require post acute rehab.    Follow Up Recommendations  SNF;Supervision/Assistance - 24 hour    Equipment Recommendations       Recommendations for Other Services       Precautions / Restrictions Precautions Precautions: Fall Restrictions Weight Bearing Restrictions: No      Mobility Bed Mobility Overal bed mobility: Needs Assistance Bed Mobility: Supine to Sit;Sit to Supine     Supine to sit: Mod assist Sit to supine: Max assist;+2 for physical assistance   General bed mobility comments: pt required increased time, mod A at bilateral LEs and trunk to achieve sitting EOB with use of bed pads to position hips. Max A x2 to return to supine from sitting.  Transfers Overall transfer level: Needs assistance Equipment used: 2 person hand held assist Transfers: Sit to/from [facility] Sit to Stand: Mod assist;+2 physical assistance Stand pivot transfers: Max assist;+2 physical assistance       General transfer comment: pt required mod A x2 to power up into standing from bed x1 and from [facility] x1. Pt required max A x2 with pivotal movement.    Balance Overall balance assessment: Needs assistance Sitting-balance  support: Bilateral upper extremity supported Sitting balance-Leahy Scale: Poor Sitting balance - Comments: pt reliant on bilateral UE supports on bed with close min guard for safety.  Postural control: Left lateral lean Standing balance support: Bilateral upper extremity supported Standing balance-Leahy Scale: Zero Standing balance comment: Max A x2 to maintain upright standing                            ADL Overall ADL's : Needs assistance/impaired Eating/Feeding: Maximal assistance;Bed level   Grooming: Wash/dry face;Bed level;Set up                   Toilet Transfer: +2 for physical assistance;Maximal assistance;Stand-pivot;BSC   Toileting- Clothing Manipulation and Hygiene: Total assistance;+2 for physical assistance;Sit to/from stand       Functional mobility during ADLs:  (not able to ambulate) General ADL Comments: Pt is dependent in bathing and dressing at baseline. Pt uses 2 hands to hold cup for drinking.     Vision Additional Comments: Pt with dysconjugate eye alignment, reports blurred vision and dizziness. Not able to perform formal vision assessment in distracting environment.   Perception Perception Comments: thought blanket was her panties   Praxis      Pertinent Vitals/Pain Pain Assessment: No/denies pain     Hand Dominance Right   Extremity/Trunk Assessment Upper Extremity Assessment Upper Extremity Assessment: RUE deficits/detail;LUE deficits/detail RUE Deficits / Details: severe arthritic deformities and limitations of all joints RUE Coordination: decreased fine motor;decreased gross motor LUE Deficits / Details: severe arthritic deformities with  limitations in all joints LUE Coordination: decreased fine motor;decreased gross motor   Lower Extremity Assessment Lower Extremity Assessment: Defer to PT evaluation       Communication Communication Communication: No difficulties   Cognition Arousal/Alertness:  Awake/alert Behavior During Therapy: Anxious Overall Cognitive Status: History of cognitive impairments - at baseline                 General Comments: pt with dementia at baseline   General Comments       Exercises       Shoulder Instructions      Home Living Family/patient expects to be discharged to:: Unsure   Available Help at Discharge: Family;Available 24 hours/day Type of Home: House                           Additional Comments: Pt lives at home with her husband who is her primary caregiver. Pt with dementia at baseline with no caregivers or family members present during evaluation.  Lives With: Family    Prior Functioning/Environment Level of Independence: Needs assistance  Gait / Transfers Assistance Needed: ambulating mod I with cane ADL's / Homemaking Assistance Needed: assisted for bathing, dressing and all IADL, reports being able to self feed            OT Problem List: Decreased strength;Decreased range of motion;Decreased activity tolerance;Impaired balance (sitting and/or standing);Impaired vision/perception;Decreased coordination;Decreased cognition;Decreased safety awareness;Decreased knowledge of use of DME or AE;Impaired UE functional use   OT Treatment/Interventions: Self-care/ADL training;DME and/or AE instruction;Therapeutic activities;Patient/family education;Visual/perceptual remediation/compensation    OT Goals(Current goals can be found in the care plan section) Acute Rehab OT Goals Patient Stated Goal: to see her husband, Ray OT Goal Formulation: Patient unable to participate in goal setting Time For Goal Achievement: 05/30/16 Potential to Achieve Goals: Fair ADL Goals Pt Will Perform Eating: with min assist;sitting (with visual compensatory strategies) Pt Will Perform Grooming: with min assist;sitting Pt Will Transfer to Toilet: with mod assist;stand pivot transfer;bedside commode Additional ADL Goal #1: Pt will sit EOB  with min guard assist x 5 minutes in preparation for ADL.  OT Frequency: Min 2X/week   Barriers to D/C:            Co-evaluation   Reason for Co-Treatment: For patient/therapist safety PT goals addressed during session: Mobility/safety with mobility;Balance;Proper use of DME        End of Session Equipment Utilized During Treatment: Gait belt  Activity Tolerance: Patient tolerated treatment well Patient left: in bed;with call bell/phone within reach;with bed alarm set   Time: 1140-1203 OT Time Calculation (min): 23 min Charges:  OT General Charges $OT Visit: 1 Procedure OT Evaluation $OT Eval Moderate Complexity: 1 Procedure G-Codes:    Malka So 05/16/2016, 12:43 PM  [phone]

## 2016-05-16 NOTE — Progress Notes (Signed)
Patient ID: Marisa [name], female   DOB: January 06, 1941, 76 y.o.   MRN: [id number]                                                                PROGRESS NOTE                                                                                                                                                                                                             Patient Demographics:    Marisa [name], is a 76 y.o. female, DOB - 07-Oct-1940, [id number]  Admit date - 05/14/2016   Admitting Physician Waldemar Dickens, MD  Outpatient Primary MD for the patient is Jani Gravel, MD  LOS - 2  Outpatient Specialists:     Chief Complaint  Patient presents with  . Altered Mental Status      Brief Narrative:  76 y.o.femalewith history of dementia, severe rheumatoid arthritis on chronic steroids and Arava, stage III chronic kidney disease, diabetes, hypertension, COPD O2 dependent on 2 L, protein calorie malnutrition,chronic anemia and GERD who was last seen normal prior to going to bed. She then awakened the morning of her admit with blurry vision in the right eye and the inability to see out of the left eye. She noted left-sided weakness as well.   In the ED a CT angio of the head and neck wasconsistent with a brainstem infarct.  She was also found to have a type B aortic dissection.  Subjective: The pt is lying in bed,  Apparently wearing mittens because she tried to pull her IV out.  Pt alert this am and responds appropriately to questions.  Pt denies headache, cp, palp, sob, n/v, lower ext edema.   Assessment & Plan:  Brainstem infarcts, acute  -Patient presents with significant visual acuity changes associated with abnormal eye movement and left-sided weakness with diagnostic evidence of brainstem infarct -Neurology following -B vertebral occlusions noted on CTa  Type B aortic dissection  -Incidental finding on CT angio of the neck -Vasc Surg following  -CT angio of the chest  confirms dissection about 51mm -will Focus on blood pressure control avoiding significant hypertension  Uncontrolled DM2 - SSI    CKD Stage 3  -baseline crt 1.3, hydrate gently with ns iv due to recent dye exposure  Last Labs    Recent Labs Lab 05/14/16 1123 05/14/16 2230 05/15/16 0224 05/15/16 0429 05/15/16 1136  CREATININE 1.30* 1.79* 1.75* 1.65* 1.48*      HTN  -BP variable - follow and titrate meds as indicated  Chronic respiratory failurewith hypoxemia / COPD  -Clinically stable on 3L - on home O2 at 2 L/m -Continue nebs and MDI  Rheumatoid arthritis  -On prednisone 10 mg daily along with [place] steroid tx - hold [place] for now   GERD  Protein calorie malnutrition  -May benefit from nutrition evaluation during the hospitalization  Iron deficiency anemia due to chronic blood loss -Baseline hemoglobin appears to bebetween 7.7 and 8.7 - stable   New L1 compression fracture Consider Forteo as outpatient/ vertebroplasty as outpatient  Last Labs    Recent Labs Lab 05/14/16 1111 05/14/16 1123  HGB 9.6* 9.2*      DVT prophylaxis: SCDs Code Status: FULL CODE Family Communication: spoke w/ son at bedside  Disposition Plan: remain in SDU - f/u CTa chest - possible aortic surgery this admit   Consultants:  Neurology  VVS  Procedures: none  Antimicrobials:  none    Lab Results  Component Value Date   PLT 222 05/16/2016    Antibiotics  :   Anti-infectives    None        Objective:   Vitals:   05/15/16 2244 05/16/16 0302 05/16/16 0744 05/16/16 1216  BP: (!) 186/105 (!) 135/95    Pulse: (!) 104 100  94  Resp: 20 14  20   Temp: 97.7 F (36.5 C) 97.7 F (36.5 C) 98.8 F (37.1 C)   TempSrc: Oral Oral Oral   SpO2: 96% 98%  100%  Weight:      Height:        Wt Readings from Last 3 Encounters:  05/14/16 49.9 kg (110 lb)  02/14/16 49.4 kg (108 lb 12.8 oz)  02/09/16 49.4 kg (108 lb 12.8 oz)     Intake/Output  Summary (Last 24 hours) at 05/16/16 1403 Last data filed at 05/16/16 0400  Gross per 24 hour  Intake          1439.17 ml  Output              200 ml  Net          1239.17 ml     Physical Exam  Awake Alert, Oriented X 3, No new F.N deficits, Normal affect [place].AT,PERRAL Supple Neck,No JVD, No cervical lymphadenopathy appriciated.  Symmetrical Chest wall movement, Good air movement bilaterally, CTAB RRR,No Gallops,Rubs or new Murmurs, No Parasternal Heave +ve B.Sounds, Abd Soft, No tenderness, No organomegaly appriciated, No rebound - guarding or rigidity. No Cyanosis, Clubbing or edema, No new Rash or bruise    Data Review:    CBC  Recent Labs Lab 05/14/16 1111 05/14/16 1123 05/16/16 0720  WBC 8.0  --  15.6*  HGB 9.6* 9.2* 8.7*  HCT 29.8* 27.0* 26.4*  PLT 220  --  222  MCV 89.2  --  87.7  MCH 28.7  --  28.9  MCHC 32.2  --  33.0  RDW 14.0  --  14.0  LYMPHSABS 1.5  --   --   MONOABS 1.0  --   --   EOSABS 0.2  --   --   BASOSABS 0.0  --   --     Chemistries   Recent Labs Lab 05/14/16 1111  05/15/16 0224 05/15/16 0429 05/15/16 1136 05/15/16  1430 05/16/16 0330  NA 133*  < > 136 139 143 141 140  K 4.6  < > 4.5 4.1 3.8 4.1 5.6*  CL 89*  < > 95* 101 103 103 103  CO2 35*  < > 25 28 32 29 27  GLUCOSE 406*  < > 443* 280* 59* 127* 123*  BUN 22*  < > 32* 33* 33* 32* 23*  CREATININE 1.38*  < > 1.75* 1.65* 1.48* 1.45* 1.12*  CALCIUM 9.0  < > 8.5* 8.4* 8.5* 8.3* 7.9*  AST 15  --   --   --   --   --  55*  ALT 13*  --   --   --   --   --  19  ALKPHOS 85  --   --   --   --   --  81  BILITOT 0.8  --   --   --   --   --  1.2  < > = values in this interval not displayed. ------------------------------------------------------------------------------------------------------------------  Recent Labs  05/15/16 0429  CHOL 185  HDL 79  LDLCALC 81  TRIG 123  CHOLHDL 2.3    Lab Results  Component Value Date   HGBA1C 7.1 (H) 05/15/2016    ------------------------------------------------------------------------------------------------------------------ No results for input(s): TSH, T4TOTAL, T3FREE, THYROIDAB in the last 72 hours.  Invalid input(s): FREET3 ------------------------------------------------------------------------------------------------------------------ No results for input(s): VITAMINB12, FOLATE, FERRITIN, TIBC, IRON, RETICCTPCT in the last 72 hours.  Coagulation profile  Recent Labs Lab 05/14/16 1111  INR 0.89    No results for input(s): DDIMER in the last 72 hours.  Cardiac Enzymes No results for input(s): CKMB, TROPONINI, MYOGLOBIN in the last 168 hours.  Invalid input(s): CK ------------------------------------------------------------------------------------------------------------------ No results found for: BNP  Inpatient Medications  Scheduled Meds: . aspirin  325 mg Oral Daily  . citalopram  10 mg Oral Daily  . clopidogrel  75 mg Oral Daily  . donepezil  10 mg Oral QHS  . insulin aspart  0-5 Units Subcutaneous QHS  . insulin aspart  0-9 Units Subcutaneous TID WC  . insulin detemir  12 Units Subcutaneous Daily  . metoprolol  5 mg Intravenous Q6H  . MUSCLE RUB  1 application Topical Daily  . pravastatin  20 mg Oral q1800  . predniSONE  10 mg Oral Daily   Continuous Infusions: . sodium chloride 100 mL/hr at 05/16/16 1145   PRN Meds:.acetaminophen **OR** [DISCONTINUED] acetaminophen (TYLENOL) oral liquid 160 mg/5 mL **OR** acetaminophen, albuterol, fluticasone, hydrALAZINE, LORazepam, ondansetron, polyvinyl alcohol  Micro Results Recent Results (from the past 240 hour(s))  MRSA PCR Screening     Status: None   Collection Time: 05/15/16  1:18 AM  Result Value Ref Range Status   MRSA by PCR NEGATIVE NEGATIVE Final    Comment:        The GeneXpert MRSA Assay (FDA approved for NASAL specimens only), is one component of a comprehensive MRSA colonization surveillance program. It  is not intended to diagnose MRSA infection nor to guide or monitor treatment for MRSA infections.   C difficile quick scan w PCR reflex     Status: None   Collection Time: 05/16/16 10:01 AM  Result Value Ref Range Status   C Diff antigen NEGATIVE NEGATIVE Final   C Diff toxin NEGATIVE NEGATIVE Final   C Diff interpretation No C. difficile detected.  Final    Radiology Reports Ct Angio Head W Or Wo Contrast  Result Date: 05/14/2016 CLINICAL DATA:  Left-sided weakness.  Left sixth nerve palsy. EXAM: CT ANGIOGRAPHY HEAD AND NECK TECHNIQUE: Multidetector CT imaging of the head and neck was performed using the standard protocol during bolus administration of intravenous contrast. Multiplanar CT image reconstructions and MIPs were obtained to evaluate the vascular anatomy. Carotid stenosis measurements (when applicable) are obtained utilizing NASCET criteria, using the distal internal carotid diameter as the denominator. CONTRAST:  50 mL Isovue 370 COMPARISON:  Noncontrast head CT earlier today and MRI 05/14/2015. Cervical spine MRI 08/19/2015. No prior angiographic imaging. FINDINGS: CTA NECK FINDINGS Aortic arch: 3 vessel aortic arch with moderate atherosclerotic plaque. Dilated distal aortic arch measuring 3.4 cm in diameter. Partially visualized aortic dissection in the proximal descending aorta. Eccentric calcified plaque in the proximal left subclavian artery results in less than 50% narrowing. Right carotid system: Medialized course of the common and proximal internal carotid arteries. Mild atherosclerotic plaque at the carotid bifurcation without stenosis. No evidence of dissection. Left carotid system: Medialized course of the common and proximal internal carotid arteries. No stenosis or evidence of dissection. Vertebral arteries: The right vertebral artery is patent proximally with mild-to-moderate proximal V1 stenosis. Additional mild atherosclerotic narrowing is present in the right V2  segment, and the right vertebral artery occludes at the C2 level with distal reconstitution of the distal V3 and V4 segments. The left vertebral artery is either markedly compressed (and not visualize) or occluded over a short segment at the level of the C3 transverse foramen. The left vertebral artery is patent proximal and distal to this, however there is a caliber change with mild smooth narrowing at the C1-2 level followed by a return to a normal caliber more distally in the the V3 segment. Skeleton: Abnormal cervical alignment as demonstrated on prior MRI. There is 8 mm anterior subluxation of C3 on C4 with appearance suggesting perched, fused facets on the left. There is C4 and C5 vertebral body height loss, and there is fusion between the vertebral bodies from C3-C6 which may reflect posttraumatic degenerative change. There is prominent focal kyphosis centered at C4, and there is slight anterolisthesis of C2 on C3. Mild erosive changes are noted involving the dens. Other neck: No neck mass or lymph node enlargement. Upper chest: Minimal pleural-parenchymal scarring in the lung apices. Review of the MIP images confirms the above findings CTA HEAD FINDINGS Anterior circulation: The internal carotid arteries are patent from skullbase to carotid termini with bilateral siphon atherosclerosis resulting in mild to moderate luminal irregularity. There is mild right ICA stenosis near the petrous - cavernous junction. An infundibulum is noted at the left posterior communicating origin. Anterior communicating artery is unremarkable. ACAs and MCAs are patent without evidence of major branch occlusion or significant proximal stenosis. No aneurysm. Posterior circulation: Intracranial vertebral arteries are patent without focal stenosis. Left PICA, bilateral AICA, and bilateral SCA origins are patent. Basilar artery is widely patent. There are small posterior communicating arteries bilaterally. The PCAs are patent without  evidence of significant stenosis. No aneurysm. Venous sinuses: Patent. Anatomic variants: None. Delayed phase: No abnormal enhancement. Review of the MIP images confirms the above findings IMPRESSION: 1. Partially visualized type B aortic dissection. 2. No major intracranial arterial occlusion. Mild intracranial right ICA stenosis. 3. Partial occlusion of the right V3 segment with distal reconstitution. 4. Severe osseous compression of the left vertebral artery at the C3 level versus short segment occlusion secondary to chronic malalignment and osseous deformities, most likely related to remote trauma. 5. Mild narrowing of the left V3 segment compatible with  short segment dissection, also potentially related to prior trauma although an acute dissection cannot be entirely excluded given the lack of prior angiographic studies for comparison. 6. No cervical carotid artery stenosis. These results were called by telephone at the time of interpretation on 05/14/2016 at 1:56 pm to Dr. Merrily Pew , who verbally acknowledged these results. Electronically Signed   By: Logan Bores M.D.   On: 05/14/2016 14:00   Ct Head Wo Contrast  Result Date: 05/14/2016 CLINICAL DATA:  Pt brought in by EMS due to having blurry vision in right eye, abnormal gait, and leaning to the left EXAM: CT HEAD WITHOUT CONTRAST TECHNIQUE: Contiguous axial images were obtained from the base of the skull through the vertex without intravenous contrast. COMPARISON:  None MRI 05/14/2015 FINDINGS: Brain: No acute intracranial hemorrhage. No focal mass lesion. No CT evidence of acute infarction. No midline shift or mass effect. No hydrocephalus. Basilar cisterns are patent. Remote infarction in the LEFT cerebellum (image 9, series 3). Deep white matter infarction in the RIGHT frontal lobe also unchanged. Periventricular and subcortical white matter hypodensities. Generalized cortical atrophy. Vascular: Rim Skull: Normal. Negative for fracture or focal  lesion. Sinuses/Orbits: Paranasal sinuses and mastoid air cells are clear. Orbits are clear. Other: None. IMPRESSION: 1. No acute intracranial findings. 2. Chronic atrophy and white matter microvascular disease. 3. Remote cerebellar infarct. Electronically Signed   By: Suzy Bouchard M.D.   On: 05/14/2016 11:44   Ct Angio Neck W And/or Wo Contrast  Result Date: 05/14/2016 CLINICAL DATA:  Left-sided weakness.  Left sixth nerve palsy. EXAM: CT ANGIOGRAPHY HEAD AND NECK TECHNIQUE: Multidetector CT imaging of the head and neck was performed using the standard protocol during bolus administration of intravenous contrast. Multiplanar CT image reconstructions and MIPs were obtained to evaluate the vascular anatomy. Carotid stenosis measurements (when applicable) are obtained utilizing NASCET criteria, using the distal internal carotid diameter as the denominator. CONTRAST:  50 mL Isovue 370 COMPARISON:  Noncontrast head CT earlier today and MRI 05/14/2015. Cervical spine MRI 08/19/2015. No prior angiographic imaging. FINDINGS: CTA NECK FINDINGS Aortic arch: 3 vessel aortic arch with moderate atherosclerotic plaque. Dilated distal aortic arch measuring 3.4 cm in diameter. Partially visualized aortic dissection in the proximal descending aorta. Eccentric calcified plaque in the proximal left subclavian artery results in less than 50% narrowing. Right carotid system: Medialized course of the common and proximal internal carotid arteries. Mild atherosclerotic plaque at the carotid bifurcation without stenosis. No evidence of dissection. Left carotid system: Medialized course of the common and proximal internal carotid arteries. No stenosis or evidence of dissection. Vertebral arteries: The right vertebral artery is patent proximally with mild-to-moderate proximal V1 stenosis. Additional mild atherosclerotic narrowing is present in the right V2 segment, and the right vertebral artery occludes at the C2 level with distal  reconstitution of the distal V3 and V4 segments. The left vertebral artery is either markedly compressed (and not visualize) or occluded over a short segment at the level of the C3 transverse foramen. The left vertebral artery is patent proximal and distal to this, however there is a caliber change with mild smooth narrowing at the C1-2 level followed by a return to a normal caliber more distally in the the V3 segment. Skeleton: Abnormal cervical alignment as demonstrated on prior MRI. There is 8 mm anterior subluxation of C3 on C4 with appearance suggesting perched, fused facets on the left. There is C4 and C5 vertebral body height loss, and there is fusion between the  vertebral bodies from C3-C6 which may reflect posttraumatic degenerative change. There is prominent focal kyphosis centered at C4, and there is slight anterolisthesis of C2 on C3. Mild erosive changes are noted involving the dens. Other neck: No neck mass or lymph node enlargement. Upper chest: Minimal pleural-parenchymal scarring in the lung apices. Review of the MIP images confirms the above findings CTA HEAD FINDINGS Anterior circulation: The internal carotid arteries are patent from skullbase to carotid termini with bilateral siphon atherosclerosis resulting in mild to moderate luminal irregularity. There is mild right ICA stenosis near the petrous - cavernous junction. An infundibulum is noted at the left posterior communicating origin. Anterior communicating artery is unremarkable. ACAs and MCAs are patent without evidence of major branch occlusion or significant proximal stenosis. No aneurysm. Posterior circulation: Intracranial vertebral arteries are patent without focal stenosis. Left PICA, bilateral AICA, and bilateral SCA origins are patent. Basilar artery is widely patent. There are small posterior communicating arteries bilaterally. The PCAs are patent without evidence of significant stenosis. No aneurysm. Venous sinuses: Patent.  Anatomic variants: None. Delayed phase: No abnormal enhancement. Review of the MIP images confirms the above findings IMPRESSION: 1. Partially visualized type B aortic dissection. 2. No major intracranial arterial occlusion. Mild intracranial right ICA stenosis. 3. Partial occlusion of the right V3 segment with distal reconstitution. 4. Severe osseous compression of the left vertebral artery at the C3 level versus short segment occlusion secondary to chronic malalignment and osseous deformities, most likely related to remote trauma. 5. Mild narrowing of the left V3 segment compatible with short segment dissection, also potentially related to prior trauma although an acute dissection cannot be entirely excluded given the lack of prior angiographic studies for comparison. 6. No cervical carotid artery stenosis. These results were called by telephone at the time of interpretation on 05/14/2016 at 1:56 pm to Dr. Merrily Pew , who verbally acknowledged these results. Electronically Signed   By: Logan Bores M.D.   On: 05/14/2016 14:00   Mr Virgel Paling [id number] Contrast  Result Date: 05/14/2016 CLINICAL DATA:  Blurry vision in right eye and left-sided weakness. EXAM: MRI HEAD WITHOUT CONTRAST MRA HEAD WITHOUT CONTRAST TECHNIQUE: Axial and coronal diffusion weighted sequences and axial time-of-flight MRA of the brain was performed. COMPARISON:  05/14/2016 CT angiogram head and neck. FINDINGS: MRI HEAD FINDINGS Small foci of diffusion restriction within the right occipital lobe, right paramedian midbrain, and scattered in bilateral cerebellar hemispheres compatible with acute/early subacute infarction. MRA HEAD FINDINGS Severe motion artifact. Suboptimal evaluation for stenosis or aneurysm. Bilateral vertebral arteries and the basilar artery are patent. Bilateral internal carotid arteries, bilateral middle cerebral arteries, and bilateral anterior cerebral arteries are patent proximally. IMPRESSION: 1. Small acute/early subacute  infarcts within the right paramedian midbrain, right occipital lobe, and bilateral cerebellar hemispheres. 2. Severe motion artifact of time-of-flight MRA. No new proximal large vessel occlusion identified. These results will be called to the ordering clinician or representative by the Radiologist Assistant, and communication documented in the PACS or zVision Dashboard. Electronically Signed   By: Kristine Garbe M.D.   On: 05/14/2016 17:57   Mr Brain Limited Wo Contrast  Result Date: 05/14/2016 CLINICAL DATA:  Blurry vision in right eye and left-sided weakness. EXAM: MRI HEAD WITHOUT CONTRAST MRA HEAD WITHOUT CONTRAST TECHNIQUE: Axial and coronal diffusion weighted sequences and axial time-of-flight MRA of the brain was performed. COMPARISON:  05/14/2016 CT angiogram head and neck. FINDINGS: MRI HEAD FINDINGS Small foci of diffusion restriction within the right occipital lobe, right  paramedian midbrain, and scattered in bilateral cerebellar hemispheres compatible with acute/early subacute infarction. MRA HEAD FINDINGS Severe motion artifact. Suboptimal evaluation for stenosis or aneurysm. Bilateral vertebral arteries and the basilar artery are patent. Bilateral internal carotid arteries, bilateral middle cerebral arteries, and bilateral anterior cerebral arteries are patent proximally. IMPRESSION: 1. 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 2. Severe motion artifact of time-of-flight MRA. No new proximal large vessel occlusion identified. These results will be called to the ordering clinician or representative by the Radiologist Assistant, and communication documented in the PACS or zVision Dashboard. Electronically Signed   By: Kristine Garbe M.D.   On: 05/14/2016 17:57   Ct Angio Chest/abd/pel For Dissection W And/or W/wo  Result Date: 05/16/2016 CLINICAL DATA:  Aortic dissection EXAM: CT  ANGIOGRAPHY CHEST, ABDOMEN AND PELVIS TECHNIQUE: Multidetector CT imaging through the chest, abdomen and pelvis was performed using the standard protocol during bolus administration of intravenous contrast. Multiplanar reconstructed images and MIPs were obtained and reviewed to evaluate the vascular anatomy. CONTRAST:  60 cc Isovue 370 COMPARISON:  None. FINDINGS: CTA CHEST FINDINGS Cardiovascular: There is no evidence of intramural hematoma. There is no evidence of dissection in the ascending aorta. 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 There is no evidence of aortic rupture. There is a small dissection flaps that is 10 mm in vertical length. 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 Maximal diameter of the aorta at this segment is 3.4 cm. The great vessels are patent. There is no obvious pulmonary thromboembolism. Mediastinum/Nodes: There is no evidence of abnormal mediastinal adenopathy. Thyroid is unremarkable. Has offered this is unremarkable. No pericardial effusion. Lungs/Pleura: There is scattered atelectasis in the lungs. No pneumothorax. No pleural effusion. Musculoskeletal: Displaced acute right seventh, eighth, ninth, and tenth rib fractures. There is healing at some of these fractures. The right sixth rib fracture has a subacute appearance. No vertebral compression deformity in the thoracic spine. Review of the MIP images confirms the above findings. CTA ABDOMEN AND PELVIS FINDINGS VASCULAR Aorta: No evidence of aortic aneurysm or dissection within the abdomen. Aorta is patent. Celiac: Patent. Mild atherosclerotic changes at the origin. Accessory left hepatic artery anatomy. SMA: Patent origin with atherosclerotic calcification. Renals: Single right renal artery is patent. There are 2 left renal arteries. The dominant left renal artery is patent. The more diminutive left renal artery is grossly patent, however the origin is somewhat  obscured. IMA: IMA is diminutive and patent. Inflow: There are atherosclerotic changes with some irregular plaque at the aortic bifurcation and origin of the common iliac arteries. There are diffuse atherosclerotic changes of the right common iliac artery without significant narrowing. Right external iliac and internal iliac arteries are patent. Atherosclerotic changes of the left common iliac artery are present without significant narrowing. Left internal and external iliac arteries are patent. Veins: Portal vein is patent. Splenic vein is patent. Renal veins are suboptimally opacified. Review of the MIP images confirms the above findings. NON-VASCULAR Hepatobiliary: Contour of the liver is somewhat nodular. The left lobe is prominent. This appearance has slightly progressed since the prior study. No focal mass. Pancreas: Pancreas is unremarkable. Spleen: Several calcified granulomata are present in the spleen. Adrenals/Urinary Tract: Chronic changes of the kidneys. Simple cysts in the left kidney on image 141 of series 6. Adrenal glands are within normal limits. The bladder is very distended. Stomach/Bowel: Small hiatal hernia. No evidence of small-bowel  obstruction. No focal mass in the colon. Diverticulosis of the colon. Moderate stool burden in the rectum. Lymphatic: There is no evidence of retroperitoneal adenopathy. Reproductive: Uterus is absent.  Adnexa are within normal limits. Other: There is no free fluid. Musculoskeletal: There are healing fractures of the superior and inferior pubic rami. There are severe degenerative changes in the lumbar spine. New L1 compression fracture is present with slight retropulsion of the superior endplate and [id number] loss height anteriorly. Levoscoliosis at L3 is present. Review of the MIP images confirms the above findings. 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 If the patient is  hypertensive, aggressive blood pressure control is recommended. No evidence of dissection or aneurysm within the abdominal aorta. Atherosclerotic changes are noted. 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 Multiple right-sided rib fractures as described. Some are healing. The right sixth rib fracture has a subacute appearance. L1 compression fracture of indeterminate age. Electronically Signed   By: Marybelle Killings M.D.   On: 05/16/2016 08:30    Time Spent in minutes  30   Jani Gravel M.D on 05/16/2016 at 2:03 PM  Between 7am to 7pm - Pager - [phone]  After 7pm go to [email or web address] - password [facility]  [facility] -  Office  [phone]

## 2016-05-16 NOTE — Progress Notes (Signed)
CBG recheck was 65, gave patient another 4 oz of regular soda, will recheck in 15 minutes.

## 2016-05-17 LAB — COMPREHENSIVE METABOLIC PANEL
ALBUMIN: 2.9 g/dL — AB (ref 3.5–5.0)
ALT: 17 U/L (ref 14–54)
ANION GAP: 9 (ref 5–15)
AST: 27 U/L (ref 15–41)
Alkaline Phosphatase: 72 U/L (ref 38–126)
BUN: 9 mg/dL (ref 6–20)
CHLORIDE: 101 mmol/L (ref 101–111)
CO2: 26 mmol/L (ref 22–32)
Calcium: 7.5 mg/dL — ABNORMAL LOW (ref 8.9–10.3)
Creatinine, Ser: 0.92 mg/dL (ref 0.44–1.00)
GFR calc Af Amer: >60 mL/min (ref >60)
GFR calc non Af Amer: 59 mL/min — ABNORMAL LOW (ref >60)
GLUCOSE: 241 mg/dL — AB (ref 65–99)
POTASSIUM: 3.4 mmol/L — AB (ref 3.5–5.1)
SODIUM: 136 mmol/L (ref 135–145)
Total Bilirubin: 0.8 mg/dL (ref 0.3–1.2)
Total Protein: 4.8 g/dL — ABNORMAL LOW (ref 6.5–8.1)

## 2016-05-17 LAB — CBC
HCT: 28.5 % — ABNORMAL LOW (ref 36.0–46.0)
HEMOGLOBIN: 9.3 g/dL — AB (ref 12.0–15.0)
MCH: 28.8 pg (ref 26.0–34.0)
MCHC: 32.6 g/dL (ref 30.0–36.0)
MCV: 88.2 fL (ref 78.0–100.0)
Platelets: 184 10*3/uL (ref 150–400)
RBC: 3.23 MIL/uL — ABNORMAL LOW (ref 3.87–5.11)
RDW: 14 % (ref 11.5–15.5)
WBC: 12 10*3/uL — ABNORMAL HIGH (ref 4.0–10.5)

## 2016-05-17 LAB — GLUCOSE, CAPILLARY
Glucose-Capillary: 36 mg/dL — CL (ref 65–99)
Glucose-Capillary: 197 mg/dL — ABNORMAL HIGH (ref 65–99)
Glucose-Capillary: 311 mg/dL — ABNORMAL HIGH (ref 65–99)
Glucose-Capillary: 249 mg/dL — ABNORMAL HIGH (ref 65–99)
Glucose-Capillary: 148 mg/dL — ABNORMAL HIGH (ref 65–99)

## 2016-05-17 MED ORDER — INSULIN ASPART 100 UNIT/ML ~~LOC~~ SOLN: 0.0000 [IU] | Freq: Every day | SUBCUTANEOUS | Status: DC

## 2016-05-17 MED ORDER — METOPROLOL TARTRATE 25 MG PO TABS: 25.0000 mg | ORAL_TABLET | Freq: Two times a day (BID) | ORAL | Status: DC

## 2016-05-17 MED ORDER — INSULIN ASPART 100 UNIT/ML ~~LOC~~ SOLN: 12.0000 [IU] | Freq: Once | SUBCUTANEOUS | Status: DC

## 2016-05-17 MED ORDER — METOPROLOL TARTRATE 50 MG PO TABS
50.0000 mg | ORAL_TABLET | Freq: Two times a day (BID) | ORAL | Status: DC
  Administered 2016-05-17 – 2016-05-18 (×2): 50 mg via ORAL
  Filled 2016-05-17 (×2): qty 1

## 2016-05-17 MED ORDER — INSULIN DETEMIR 100 UNIT/ML ~~LOC~~ SOLN
8.0000 [IU] | Freq: Every day | SUBCUTANEOUS | Status: DC
  Administered 2016-05-18: 8 [IU] via SUBCUTANEOUS
  Filled 2016-05-17: qty 0.08

## 2016-05-17 MED ORDER — DEXTROSE 50 % IV SOLN
1.0000 | Freq: Once | INTRAVENOUS | Status: AC
  Administered 2016-05-17: 50 mL via INTRAVENOUS
  Filled 2016-05-17: qty 50

## 2016-05-17 MED ORDER — INSULIN ASPART 100 UNIT/ML ~~LOC~~ SOLN
0.0000 [IU] | Freq: Three times a day (TID) | SUBCUTANEOUS | Status: DC
  Administered 2016-05-17: 7 [IU] via SUBCUTANEOUS

## 2016-05-17 NOTE — Clinical Social Work Placement (Signed)
   CLINICAL SOCIAL WORK PLACEMENT  NOTE  Date:  05/17/2016  Patient Details  Name: Marisa [name] MRN: [id number] Date of Birth: 1940/11/03  Clinical Social Work is seeking post-discharge placement for this patient at the [place] level of care (*CSW will initial, date and re-position this form in  chart as items are completed):  Yes   Patient/family provided with [place] Work Department's list of facilities offering this level of care within the geographic area requested by the patient (or if unable, by the patient's family).  Yes   Patient/family informed of their freedom to choose among providers that offer the needed level of care, that participate in Medicare, Medicaid or managed care program needed by the patient, have an available bed and are willing to accept the patient.  Yes   Patient/family informed of [facility]'s ownership interest in [facility] and [facility], as well as of the fact that they are under no obligation to receive care at these facilities.  PASRR submitted to EDS on 05/17/16     PASRR number received on       Existing PASRR number confirmed on 05/17/16     FL2 transmitted to all facilities in geographic area requested by pt/family on 05/17/16     FL2 transmitted to all facilities within larger geographic area on       Patient informed that his/her managed care company has contracts with or will negotiate with certain facilities, including the following:            Patient/family informed of bed offers received.  Patient chooses bed at       Physician recommends and patient chooses bed at      Patient to be transferred to   on  .  Patient to be transferred to facility by       Patient family notified on   of transfer.  Name of family member notified:        PHYSICIAN Please sign FL2     Additional Comment:    _______________________________________________ Candie Chroman, LCSW 05/17/2016,  12:36 PM

## 2016-05-17 NOTE — NC FL2 (Signed)
[place] LEVEL OF CARE SCREENING TOOL     IDENTIFICATION  Patient Name: Marisa [name] Birthdate: 02-May-1940 Sex: female Admission Date (Current Location): 05/14/2016  [facility] and [place] Number:  Herbalist and Address:  [facility]. [facility], [place] [address], [place], [place] [address]      Provider Number: [id number]  Attending Physician Name and Address:  Cherene Altes, MD  Relative Name and Phone Number:       Current Level of Care: Hospital Recommended Level of Care: [place] Prior Approval Number:    Date Approved/Denied:   PASRR Number: [id number]  Discharge Plan: SNF    Current Diagnoses: Patient Active Problem List   Diagnosis Date Noted  . Stroke ([place])   . Diplopia   . Brainstem infarct, acute ([place]) 05/14/2016  . Aortic dissection ([place]) 05/14/2016  . Diabetes mellitus with complication ([place])   . Rib fractures 01/18/2016  . Chronic renal insufficiency, stage 3 (moderate)   . Memory loss 05/09/2015  . Immunosuppressed status ([place])   . Shoulder effusion 05/28/2014  . Rheumatoid arthritis ([place]) 05/28/2014  . Insulin dependent type 2 diabetes mellitus, uncontrolled ([place]) 05/28/2014  . Symptomatic anemia 04/13/2014  . Iron deficiency anemia due to chronic blood loss 03/07/2013  . Chronic respiratory failure ([place]) 02/11/2013  . 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 Personal history of colonic polyps 01/05/2013  . Protein calorie malnutrition ([place]) 10/08/2011  . Diabetes mellitus type 2, insulin dependent ([place])   . Anxiety state 06/08/2007  . HTN (hypertension) 06/08/2007  . UNSPECIFIED ACUTE PERICARDITIS 06/08/2007  . SINUS ARRHYTHMIA 06/08/2007  . HEMORRHOIDS, INTERNAL 06/08/2007  . ALLERGIC RHINITIS 06/08/2007  . COPD (chronic obstructive pulmonary disease) ([place]) 06/08/2007  . ESOPHAGEAL STRICTURE 06/08/2007  . GERD 06/08/2007  . CONSTIPATION, CHRONIC 06/08/2007  . IRRITABLE BOWEL SYNDROME  06/08/2007  . SLEEP APNEA 06/08/2007  . HEADACHE, CHRONIC 06/08/2007  . NEOPLASM, MALIGNANT, CARCINOMA, BASAL CELL, NOSE 06/08/2007    Orientation RESPIRATION BLADDER Height & Weight     Self  O2 (Nasal Canula 3 L) Continent Weight: 110 lb (49.9 kg) Height:  5' (152.4 cm)  BEHAVIORAL SYMPTOMS/MOOD NEUROLOGICAL BOWEL NUTRITION STATUS   (Calls out to make sure staff nearby, gets anxious)  (Stroke) Continent Diet (Regular)  AMBULATORY STATUS COMMUNICATION OF NEEDS Skin   Extensive Assist Verbally Bruising                       Personal Care Assistance Level of Assistance  Bathing, Feeding, Dressing Bathing Assistance: Maximum assistance Feeding assistance: Maximum assistance Dressing Assistance: Maximum assistance     Functional Limitations Info  Sight, Hearing, Speech Sight Info: Adequate Hearing Info: Adequate Speech Info: Impaired (Slurred/Dysarthria)    SPECIAL CARE FACTORS FREQUENCY  PT (By licensed PT), Blood pressure, OT (By licensed OT)     PT Frequency: 5 x week OT Frequency: 5 x week            Contractures Contractures Info: Not present    Additional Factors Info  Code Status, Allergies, Psychotropic Code Status Info: Full Allergies Info: Demerol (Meperidine), Penicillins, Enbrel (Etanercept), Humira (Adalimumab) Psychotropic Info: Anxiety: Celexa 10 mg PO daily, Ativan 0.5 mg PO every 4 hours prn         Current Medications (05/17/2016):  This is the current hospital active medication list Current Facility-Administered Medications  Medication Dose Route Frequency Provider Last Rate Last Dose  . 0.9 %  sodium chloride infusion  Intravenous Continuous Jani Gravel, MD 50 mL/hr at 05/16/16 1515    . acetaminophen (TYLENOL) tablet 650 mg  650 mg Oral Q4H PRN Samella Parr, NP   650 mg at 05/16/16 0245   Or  . acetaminophen (TYLENOL) suppository 650 mg  650 mg Rectal Q4H PRN Samella Parr, NP      . albuterol (PROVENTIL) (2.5 MG/3ML) 0.083%  nebulizer solution 2.5 mg  2.5 mg Nebulization Q2H PRN Cherene Altes, MD      . aspirin tablet 325 mg  325 mg Oral Daily Samella Parr, NP   325 mg at 05/17/16 1030  . citalopram (CELEXA) tablet 10 mg  10 mg Oral Daily Cherene Altes, MD   10 mg at 05/17/16 1030  . clopidogrel (PLAVIX) tablet 75 mg  75 mg Oral Daily Rosalin Hawking, MD   75 mg at 05/17/16 1030  . donepezil (ARICEPT) tablet 10 mg  10 mg Oral QHS Cherene Altes, MD   10 mg at 05/16/16 2059  . fluticasone (FLONASE) 50 MCG/ACT nasal spray 2 spray  2 spray Each Nare Daily PRN Samella Parr, NP      . hydrALAZINE (APRESOLINE) injection 10 mg  10 mg Intravenous Q4H PRN Waldemar Dickens, MD      . insulin aspart (novoLOG) injection 0-5 Units  0-5 Units Subcutaneous QHS Cherene Altes, MD   3 Units at 05/15/16 2132  . insulin aspart (novoLOG) injection 0-9 Units  0-9 Units Subcutaneous TID WC Cherene Altes, MD   7 Units at 05/17/16 1139  . insulin detemir (LEVEMIR) injection 12 Units  12 Units Subcutaneous Daily Cherene Altes, MD   Stopped at 05/17/16 1032  . LORazepam (ATIVAN) tablet 0.5 mg  0.5 mg Oral Q4H PRN Cherene Altes, MD   0.5 mg at 05/17/16 0217  . metoprolol (LOPRESSOR) injection 5 mg  5 mg Intravenous Q6H Cherene Altes, MD   5 mg at 05/17/16 1139  . MUSCLE RUB CREA 1 application  1 application Topical Daily Samella Parr, NP   1 application at [phone]  . ondansetron (ZOFRAN) injection 4 mg  4 mg Intravenous Q6H PRN Jeryl Columbia, NP   4 mg at 05/14/16 2229  . polyvinyl alcohol (LIQUIFILM TEARS) 1.4 % ophthalmic solution   Both Eyes Daily PRN Samella Parr, NP      . pravastatin (PRAVACHOL) tablet 20 mg  20 mg Oral q1800 Rosalin Hawking, MD   20 mg at 05/16/16 1823  . predniSONE (DELTASONE) tablet 10 mg  10 mg Oral Daily Cherene Altes, MD   10 mg at 05/17/16 1030     Discharge Medications: Please see discharge summary for a list of discharge medications.  Relevant Imaging  Results:  Relevant Lab Results:   Additional Information SS#: [id number]. Has a telesitter.  Candie Chroman, LCSW

## 2016-05-17 NOTE — Progress Notes (Addendum)
Inpatient Diabetes Program Recommendations  AACE/ADA: New Consensus Statement on Inpatient Glycemic Control (2015)  Target Ranges:  Prepandial:   less than 140 mg/dL      Peak postprandial:   less than 180 mg/dL (1-2 hours)      Critically ill patients:  140 - 180 mg/dL   Lab Results  Component Value Date   GLUCAP 311 (H) 05/17/2016   HGBA1C 7.1 (H) 05/15/2016    Review of Glycemic Control Results for ENYLA, HENKIN (MRN [id number]) as of 05/17/2016 13:08  Ref. Range 05/16/2016 21:21 05/16/2016 21:55 05/17/2016 06:20 05/17/2016 07:18 05/17/2016 11:14  Glucose-Capillary Latest Ref Range: 65 - 99 mg/dL 33 (LL) 51 (L) 36 (LL) 197 (H) 311 (H)   Inpatient Diabetes Program Recommendations:   Please consider decreasing Levemir to 8 units daily on 05/17/16. Text page sent to Dr. Thereasa Solo.  Thank you, Nani Gasser. Shawn Carattini, RN, MSN, CDE Inpatient Glycemic Control Team Team Pager [phone] (8am-5pm) 05/17/2016 11:54 AM

## 2016-05-17 NOTE — Clinical Social Work Note (Signed)
Clinical Social Work Assessment  Patient Details  Name: Marisa [name] MRN: [id number] Date of Birth: 06-Feb-1941  Date of referral:  05/17/16               Reason for consult:  Facility Placement, Discharge Planning                Permission sought to share information with:  Facility Sport and exercise psychologist, Family Supports Permission granted to share information::  Yes, Verbal Permission Granted  Name::     Marisa [name]  Agency::  SNF's  Relationship::  Husband  Contact Information:  [phone]  Housing/Transportation Living arrangements for the past 2 months:  [place] of Information:  Medical Team, Spouse Patient Interpreter Needed:  None Criminal Activity/Legal Involvement Pertinent to Current Situation/Hospitalization:  No - Comment as needed Significant Relationships:  Adult Children, Spouse Lives with:  Spouse Do you feel safe going back to the place where you live?  Yes Need for family participation in patient care:  Yes (Comment)  Care giving concerns:  PT recommending SNF once medically stable for discharge.   Social Worker assessment / plan:  Patient oriented to self only. Patient's husband not in room so CSW called him. CSW introduced role and explained that PT recommendations would be discussed. Patient's husband agreeable to SNF placement. First preference is [facility] because patient has been there in the past and liked it. No further concerns. CSW encouraged patient's husband to contact CSW as needed. CSW will continue to follow patient and her husband for support and facilitate discharge to SNF once medically stable.  Employment status:  Retired Forensic scientist:  Medicare PT Recommendations:  [place] / Referral to community resources:  [place]  Patient/Family's Response to care:  Patient oriented to self only. Patient's husband agreeable to SNF placement. Patient's husband and daughter  supportive and involved in patient's care. Patient appreciated social work intervention.  Patient/Family's Understanding of and Emotional Response to Diagnosis, Current Treatment, and Prognosis:  Patient oriented to self only. Patient's husband has a good understanding of reason for admission. Patient's husband appears happy with hospital care.  Emotional Assessment Appearance:  Appears stated age Attitude/Demeanor/Rapport:  Other (Pleasant) Affect (typically observed):  Accepting, Appropriate, Calm, Pleasant Orientation:  Oriented to Self, Oriented to Place, Oriented to  Time, Oriented to Situation Alcohol / Substance use:  Never Used Psych involvement (Current and /or in the community):  No (Comment)  Discharge Needs  Concerns to be addressed:  Care Coordination Readmission within the last 30 days:  No Current discharge risk:  Cognitively Impaired, Dependent with Mobility Barriers to Discharge:  Continued Medical Work up   Candie Chroman, LCSW 05/17/2016, 12:33 PM

## 2016-05-17 NOTE — Clinical Social Work Note (Signed)
Patient's husband has accepted bed offer from [facility]. Awaiting response from admissions coordinator.  Dayton Scrape, [place]

## 2016-05-17 NOTE — Progress Notes (Signed)
[place] TEAM 1 - Stepdown/ICU TEAM  MAJA COLANDREA  [id number] DOB: [birth year] DOA: 05/14/2016 PCP: Jani Gravel, MD    Brief Narrative:  76 y.o. female with history of dementia, severe rheumatoid arthritis on chronic steroids and Arava, stage III chronic kidney disease, diabetes, hypertension, COPD O2 dependent on 2 L, protein calorie malnutrition, chronic anemia and GERD who was last seen normal prior to going to bed. She then awakened the morning of her admit with blurry vision in the right eye and the inability to see out of the left eye. She noted left-sided weakness as well.   In the ED a CT angio of the head and neck was suggestive of a brainstem infarct.  She was also found to have a type B aortic dissection.  Subjective: The patient is laying in bed and appears comfortable.  When questioned she reports pain "all over."  She denies significant shortness of breath but does report intermittent shortness of breath especially when exerting herself.  She denies abdominal distention but reports a very poor appetite.  She is alert but mildly confused.  Assessment & Plan:  Right paramedian midbrain, right occipital lobe, 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 -Patient presents with significant visual acuity changes associated with abnormal eye movement and left-sided weakness with diagnostic evidence of brainstem infarct -Neurology following and suggests DAPT for 3 months and then plavix alone -B vertebral occlusions noted on CTa  Type B aortic dissection  -Incidental finding on CT angio of the neck -Vasc Surg has evaluated  -CTa chest notes "the area of concern on the proximal descending thoracic aorta is fairly localized and appears to be a localized dissection versus a penetrating ulcer" per VVS -to f/u w/ Dr. Scot Dock in 6 months for repeat CT -Focus on blood pressure control avoiding significant hypertension  Uncontrolled  DM2 -CBG variable, with some episodes of hypoglycemia - adjust insulin dosing and follow   CKD Stage 3  -baseline crt 1.3 - crt stable/improved post CTangio  Recent Labs Lab 05/15/16 1136 05/15/16 1430 05/16/16 0330 05/16/16 1502 05/17/16 1017  CREATININE 1.48* 1.45* 1.12* 1.10* 0.92    HTN  -BP remains poorly controlled - adjust tx and follow trend   HLD -Cont pravastatin  Chronic respiratory failure with hypoxemia / COPD  -on home O2 at 2 L/m - currently stable on home dose of O2  Rheumatoid arthritis  -On prednisone 10 mg daily along with [place] steroid tx - hold [place] for now   GERD  Iron deficiency anemia due to chronic blood loss -Baseline hemoglobin appears to be between 7.7 and 8.7 - Hgb presently better than baseline    Recent Labs Lab 05/14/16 1111 05/14/16 1123 05/16/16 0720 05/17/16 1017  HGB 9.6* 9.2* 8.7* 9.3*    DVT prophylaxis: SCDs Code Status: FULL CODE Family Communication: no family present at time of exam today Disposition Plan: clinically stabilizing - titrate BP meds - ready for SNF as soon as BP better controlled    Consultants:  Neurology  VVS  Procedures: none  Antimicrobials:  none   Objective: Blood pressure (!) 162/94, pulse 96, temperature 97.7 F (36.5 C), temperature source Oral, resp. rate (!) 23, height 5' (1.524 m), weight 49.9 kg (110 lb), SpO2 100 %.  Intake/Output Summary (Last 24 hours) at 05/17/16 1547 Last data filed at 05/17/16 1314  Gross per 24 hour  Intake  437.5 ml  Output             1677 ml  Net          -1239.5 ml   Filed Weights   05/14/16 1109  Weight: 49.9 kg (110 lb)    Examination: General: No acute respiratory distress evident  Lungs: Clear to auscultation bilaterally  Cardiovascular: Regular rate and rhythm without murmur  Abdomen: Nontender, nondistended, soft, bowel sounds positive, no rebound, no ascites, no appreciable mass Extremities: No significant cyanosis,  clubbing, edema bilateral lower extremities  CBC:  Recent Labs Lab 05/14/16 1111 05/14/16 1123 05/16/16 0720 05/17/16 1017  WBC 8.0  --  15.6* 12.0*  NEUTROABS 5.4  --   --   --   HGB 9.6* 9.2* 8.7* 9.3*  HCT 29.8* 27.0* 26.4* 28.5*  MCV 89.2  --  87.7 88.2  PLT 220  --  222 [id number]   Basic Metabolic Panel:  Recent Labs Lab 05/15/16 1136 05/15/16 1430 05/16/16 0330 05/16/16 1502 05/17/16 1017  NA 143 141 140 137 136  K 3.8 4.1 5.6* 3.5 3.4*  CL 103 103 103 102 101  CO2 32 29 27 23 26   GLUCOSE 59* 127* 123* 142* 241*  BUN 33* 32* 23* 17 9  CREATININE 1.48* 1.45* 1.12* 1.10* 0.92  CALCIUM 8.5* 8.3* 7.9* 7.6* 7.5*   GFR: Estimated Creatinine Clearance: 38 mL/min (by C-G formula based on SCr of 0.92 mg/dL).  Liver Function Tests:  Recent Labs Lab 05/14/16 1111 05/16/16 0330 05/17/16 1017  AST 15 55* 27  ALT 13* 19 17  ALKPHOS 85 81 72  BILITOT 0.8 1.2 0.8  PROT 5.5* 5.1* 4.8*  ALBUMIN 3.4* 3.3* 2.9*    Coagulation Profile:  Recent Labs Lab 05/14/16 1111  INR 0.89   HbA1C: Hemoglobin A1C  Date/Time Value Ref Range Status  02/02/2016 6.8  Final   Hgb A1c MFr Bld  Date/Time Value Ref Range Status  05/15/2016 04:29 AM 7.1 (H) 4.8 - 5.6 % Final    Comment:    (NOTE)         Pre-diabetes: 5.7 - 6.4         Diabetes: >6.4         Glycemic control for adults with diabetes: <7.0   05/14/2016 11:11 AM 7.1 (H) 4.8 - 5.6 % Final    Comment:    (NOTE)         Pre-diabetes: 5.7 - 6.4         Diabetes: >6.4         Glycemic control for adults with diabetes: <7.0     CBG:  Recent Labs Lab 05/16/16 2121 05/16/16 2155 05/17/16 0620 05/17/16 0718 05/17/16 1114  GLUCAP 33* 51* 36* 197* 311*    Recent Results (from the past 240 hour(s))  MRSA PCR Screening     Status: None   Collection Time: 05/15/16  1:18 AM  Result Value Ref Range Status   MRSA by PCR NEGATIVE NEGATIVE Final    Comment:        The GeneXpert MRSA Assay (FDA approved for NASAL  specimens only), is one component of a comprehensive MRSA colonization surveillance program. It is not intended to diagnose MRSA infection nor to guide or monitor treatment for MRSA infections.   C difficile quick scan w PCR reflex     Status: None   Collection Time: 05/16/16 10:01 AM  Result Value Ref Range Status   C Diff antigen NEGATIVE NEGATIVE Final  C Diff toxin NEGATIVE NEGATIVE Final   C Diff interpretation No C. difficile detected.  Final     Scheduled Meds: . aspirin  325 mg Oral Daily  . citalopram  10 mg Oral Daily  . clopidogrel  75 mg Oral Daily  . donepezil  10 mg Oral QHS  . insulin aspart  0-5 Units Subcutaneous QHS  . insulin aspart  0-9 Units Subcutaneous TID WC  . insulin detemir  12 Units Subcutaneous Daily  . metoprolol  5 mg Intravenous Q6H  . MUSCLE RUB  1 application Topical Daily  . pravastatin  20 mg Oral q1800  . predniSONE  10 mg Oral Daily      LOS: 3 days   Cherene Altes, MD [facility] Office  [phone] Pager - Text Page per Amion as per below:  On-Call/Text Page:      [email or web address]      password [email or web address]  If 7PM-7AM, please contact night-coverage [email or web address] Password [facility] 05/17/2016, 3:47 PM

## 2016-05-17 NOTE — Care Management Note (Signed)
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  Patient is from home with spouse, patient has dementia, husband was also admitted to hospital for palpitations, he is her caregiver, he has now been released from hospital.  Pt/ot eval rec SNF, CSW aware.  NCM will cont to follow for dc needs               Action/Plan:   Expected Discharge Date:                  Expected Discharge Plan:  [place]  In-House Referral:  Clinical Social Work  Discharge planning Services  CM Consult  Post Acute Care Choice:    Choice offered to:     DME Arranged:    DME Agency:     HH Arranged:    [place] Agency:     Status of Service:  Completed, signed off  If discussed at [employer], dates discussed:    Additional Comments:  Zenon Mayo, RN 05/17/2016, 11:48 AM

## 2016-05-17 NOTE — Progress Notes (Signed)
Patient has been confused today but has remained oriented to self. Patient has had episodes where she calls out for help and wants company, is anxious, or in pain. Patient is easily settled down and has remained in a pleasant disposition. Patient has been continent of bowel and bladder and has been calling when she needs to use the bedpan appropriately. Support given and will continue to monitor.

## 2016-05-18 LAB — GLUCOSE, CAPILLARY
GLUCOSE-CAPILLARY: 101 mg/dL — AB (ref 65–99)
GLUCOSE-CAPILLARY: 31 mg/dL — AB (ref 65–99)
GLUCOSE-CAPILLARY: 80 mg/dL (ref 65–99)
Glucose-Capillary: 409 mg/dL — ABNORMAL HIGH (ref 65–99)
Glucose-Capillary: 371 mg/dL — ABNORMAL HIGH (ref 65–99)
Glucose-Capillary: 37 mg/dL — CL (ref 65–99)
Glucose-Capillary: 96 mg/dL (ref 65–99)

## 2016-05-18 MED ORDER — DEXTROSE 50 % IV SOLN
INTRAVENOUS | Status: AC
  Administered 2016-05-18: 25 mL
  Filled 2016-05-18: qty 50

## 2016-05-18 MED ORDER — METOPROLOL TARTRATE 100 MG PO TABS
100.0000 mg | ORAL_TABLET | Freq: Two times a day (BID) | ORAL | Status: DC
  Administered 2016-05-18 – 2016-05-23 (×10): 100 mg via ORAL
  Filled 2016-05-18 (×10): qty 1

## 2016-05-18 MED ORDER — INSULIN ASPART 100 UNIT/ML ~~LOC~~ SOLN
0.0000 [IU] | Freq: Every day | SUBCUTANEOUS | Status: DC
  Administered 2016-05-21: 2 [IU] via SUBCUTANEOUS

## 2016-05-18 MED ORDER — INSULIN ASPART 100 UNIT/ML ~~LOC~~ SOLN
0.0000 [IU] | Freq: Every day | SUBCUTANEOUS | Status: DC
Start: 2016-05-18 — End: 2016-05-18

## 2016-05-18 MED ORDER — DEXTROSE-NACL 5-0.9 % IV SOLN
INTRAVENOUS | Status: DC
  Administered 2016-05-18: 19:00:00 via INTRAVENOUS

## 2016-05-18 MED ORDER — INSULIN ASPART 100 UNIT/ML ~~LOC~~ SOLN
0.0000 [IU] | Freq: Three times a day (TID) | SUBCUTANEOUS | Status: DC
  Administered 2016-05-19: 2 [IU] via SUBCUTANEOUS
  Administered 2016-05-19 – 2016-05-20 (×2): 5 [IU] via SUBCUTANEOUS
  Administered 2016-05-21: 1 [IU] via SUBCUTANEOUS
  Administered 2016-05-21 (×2): 3 [IU] via SUBCUTANEOUS
  Administered 2016-05-22: 5 [IU] via SUBCUTANEOUS
  Administered 2016-05-22 – 2016-05-23 (×2): 9 [IU] via SUBCUTANEOUS
  Administered 2016-05-23: 3 [IU] via SUBCUTANEOUS

## 2016-05-18 MED ORDER — INSULIN ASPART 100 UNIT/ML ~~LOC~~ SOLN
22.0000 [IU] | Freq: Once | SUBCUTANEOUS | Status: AC
  Administered 2016-05-18: 22 [IU] via SUBCUTANEOUS

## 2016-05-18 MED ORDER — INSULIN DETEMIR 100 UNIT/ML ~~LOC~~ SOLN
10.0000 [IU] | Freq: Every day | SUBCUTANEOUS | Status: DC
Start: 2016-05-19 — End: 2016-05-18

## 2016-05-18 MED ORDER — INSULIN DETEMIR 100 UNIT/ML ~~LOC~~ SOLN
10.0000 [IU] | Freq: Every day | SUBCUTANEOUS | Status: DC
  Administered 2016-05-19: 10 [IU] via SUBCUTANEOUS
  Filled 2016-05-18 (×2): qty 0.1

## 2016-05-18 MED ORDER — INSULIN ASPART 100 UNIT/ML ~~LOC~~ SOLN
0.0000 [IU] | Freq: Three times a day (TID) | SUBCUTANEOUS | Status: DC
  Administered 2016-05-18: 15 [IU] via SUBCUTANEOUS

## 2016-05-18 NOTE — Progress Notes (Signed)
Hypoglycemic Event - Late Entry  CBG: 31  Treatment: D50 IV 25 mL, and apple juice  Symptoms: Pale, Sweaty and Nervous/irritable  Follow-up CBG: Time:1746 CBG Result:96  Possible Reasons for Event: Inadequate meal intake  Comments/MD notified:Dr. Thereasa Solo - transfer to med/surg orders cancelled. Insulin orders adjusted. D5NS infusion started.    Aaron Edelman, Delanie Tirrell [place]

## 2016-05-18 NOTE — Progress Notes (Signed)
Marisa [name]  [id number] DOB: July 07, 1940 DOA: 05/14/2016 PCP: Jani Gravel, MD    Brief Narrative:  76 y.o. female with history of dementia, severe rheumatoid arthritis on chronic steroids and Arava, stage III chronic kidney disease, diabetes, hypertension, COPD O2 dependent on 2 L, protein calorie malnutrition, chronic anemia and GERD who was last seen normal prior to going to bed. She then awakened the morning of her admit with blurry vision in the right eye and the inability to see out of the left eye. She noted left-sided weakness as well.   In the ED a CT angio of the head and neck was suggestive of a brainstem infarct.  She was also found to have a type B aortic dissection.  Subjective: The pt is alert but confused.  She c/o pain "all over" but does not appear to be in any acute distress.   Assessment & Plan:  Right paramedian midbrain, right occipital lobe, 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 -Patient presents with significant visual acuity changes associated with abnormal eye movement and left-sided weakness with diagnostic evidence of brainstem infarct -Neurology evaluated and suggests DAPT for 3 months and then plavix alone -B vertebral occlusions noted on CTa  Type B aortic dissection  -Incidental finding on CT angio of the neck -Vasc Surg has evaluated  -CTa chest notes "the area of concern on the proximal descending thoracic aorta is fairly localized and appears to be a localized dissection versus a penetrating ulcer" per VVS -to f/u w/ Dr. Scot Dock in 6 months for repeat CT -Focus on blood pressure control avoiding significant hypertension  Uncontrolled DM2 -CBGs remain quite labile - adjust tx again today - follow trend - will not be safe for SNF d/c until CBG more consistently in a safe range given risk of severe hyoglycemia as well as hyperglycemia   CKD Stage 3    -baseline crt 1.3 - crt stable/improved post CTangio  Recent Labs Lab 05/15/16 1136 05/15/16 1430 05/16/16 0330 05/16/16 1502 05/17/16 1017  CREATININE 1.48* 1.45* 1.12* 1.10* 0.92    HTN  -BP remains quite variable, but has improved overall - adjust tx again today and follow   HLD -Cont pravastatin  Chronic respiratory failure with hypoxemia / COPD  -on home O2 at 2 L/m - currently stable on home dose of O2  Rheumatoid arthritis  -On prednisone 10 mg daily along with [place] steroid tx - hold Arava for now   GERD  Iron deficiency anemia due to chronic blood loss -Baseline hemoglobin appears to be between 7.7 and 8.7 - Hgb presently better than baseline    Recent Labs Lab 05/14/16 1111 05/14/16 1123 05/16/16 0720 05/17/16 1017  HGB 9.6* 9.2* 8.7* 9.3*    DVT prophylaxis: SCDs Code Status: FULL CODE Family Communication: no family present at time of exam today Disposition Plan: clinically stabilizing - cont to titrate BP meds - ready for SNF as soon as BP and CBG better controlled (?Monday)  Consultants:  Neurology  VVS  Procedures: none  Antimicrobials:  none   Objective: Blood pressure 139/74, pulse (!) 109, temperature 97.9 F (36.6 C), temperature source Axillary, resp. rate 12, height 5' (1.524 m), weight 49.9 kg (110 lb), SpO2 100 %.  Intake/Output Summary (Last 24 hours) at 05/18/16 1145 Last data filed at 05/18/16 1100  Gross per 24 hour  Intake  120 ml  Output             1226 ml  Net            -1106 ml   Filed Weights   05/14/16 1109  Weight: 49.9 kg (110 lb)    Examination: General: No acute respiratory distress apparent  Lungs: Clear to auscultation bilaterally w/o wheeze  Cardiovascular: Regular rate and rhythm - no gallup or rub  Abdomen: Nontender, nondistended, soft, bowel sounds positive, no rebound, no ascites, no appreciable mass Extremities: No significant cyanosis, clubbing, or edema bilateral lower  extremities  CBC:  Recent Labs Lab 05/14/16 1111 05/14/16 1123 05/16/16 0720 05/17/16 1017  WBC 8.0  --  15.6* 12.0*  NEUTROABS 5.4  --   --   --   HGB 9.6* 9.2* 8.7* 9.3*  HCT 29.8* 27.0* 26.4* 28.5*  MCV 89.2  --  87.7 88.2  PLT 220  --  222 [id number]   Basic Metabolic Panel:  Recent Labs Lab 05/15/16 1136 05/15/16 1430 05/16/16 0330 05/16/16 1502 05/17/16 1017  NA 143 141 140 137 136  K 3.8 4.1 5.6* 3.5 3.4*  CL 103 103 103 102 101  CO2 32 29 27 23 26   GLUCOSE 59* 127* 123* 142* 241*  BUN 33* 32* 23* 17 9  CREATININE 1.48* 1.45* 1.12* 1.10* 0.92  CALCIUM 8.5* 8.3* 7.9* 7.6* 7.5*   GFR: Estimated Creatinine Clearance: 38 mL/min (by C-G formula based on SCr of 0.92 mg/dL).  Liver Function Tests:  Recent Labs Lab 05/14/16 1111 05/16/16 0330 05/17/16 1017  AST 15 55* 27  ALT 13* 19 17  ALKPHOS 85 81 72  BILITOT 0.8 1.2 0.8  PROT 5.5* 5.1* 4.8*  ALBUMIN 3.4* 3.3* 2.9*    Coagulation Profile:  Recent Labs Lab 05/14/16 1111  INR 0.89   HbA1C: Hemoglobin A1C  Date/Time Value Ref Range Status  02/02/2016 6.8  Final   Hgb A1c MFr Bld  Date/Time Value Ref Range Status  05/15/2016 04:29 AM 7.1 (H) 4.8 - 5.6 % Final    Comment:    (NOTE)         Pre-diabetes: 5.7 - 6.4         Diabetes: >6.4         Glycemic control for adults with diabetes: <7.0   05/14/2016 11:11 AM 7.1 (H) 4.8 - 5.6 % Final    Comment:    (NOTE)         Pre-diabetes: 5.7 - 6.4         Diabetes: >6.4         Glycemic control for adults with diabetes: <7.0     CBG:  Recent Labs Lab 05/17/16 1114 05/17/16 1700 05/17/16 1947 05/18/16 0932 05/18/16 1131  GLUCAP 311* 249* 148* 409* 371*    Recent Results (from the past 240 hour(s))  MRSA PCR Screening     Status: None   Collection Time: 05/15/16  1:18 AM  Result Value Ref Range Status   MRSA by PCR NEGATIVE NEGATIVE Final    Comment:        The GeneXpert MRSA Assay (FDA approved for NASAL specimens only), is one  component of a comprehensive MRSA colonization surveillance program. It is not intended to diagnose MRSA infection nor to guide or monitor treatment for MRSA infections.   C difficile quick scan w PCR reflex     Status: None   Collection Time: 05/16/16 10:01 AM  Result Value Ref Range Status  C Diff antigen NEGATIVE NEGATIVE Final   C Diff toxin NEGATIVE NEGATIVE Final   C Diff interpretation No C. difficile detected.  Final     Scheduled Meds: . aspirin  325 mg Oral Daily  . citalopram  10 mg Oral Daily  . clopidogrel  75 mg Oral Daily  . donepezil  10 mg Oral QHS  . insulin aspart  0-20 Units Subcutaneous TID WC  . insulin aspart  0-5 Units Subcutaneous QHS  . [START ON 05/19/2016] insulin detemir  10 Units Subcutaneous Daily  . metoprolol tartrate  50 mg Oral BID  . MUSCLE RUB  1 application Topical Daily  . pravastatin  20 mg Oral q1800  . predniSONE  10 mg Oral Daily      LOS: 4 days   Cherene Altes, MD [facility] Office  [phone] Pager - Text Page per Amion as per below:  On-Call/Text Page:      [email or web address]      password [email or web address]  If 7PM-7AM, please contact night-coverage [email or web address] Password [email or web address] 05/18/2016, 11:45 AM

## 2016-05-18 NOTE — Progress Notes (Signed)
Hypoglycemic Event  CBG: 37  Treatment: D50 IV 25 mL  Symptoms: Sweaty and Shaky  Follow-up CBG: Time:1530 CBG Result:101  Possible Reasons for Event: Inadequate meal intake and Medication regimen: Insulin doses adjusted  Comments/MD notified:Dr. Dara Lords, Sneha Willig Dyann Ruddle

## 2016-05-19 LAB — GLUCOSE, CAPILLARY
GLUCOSE-CAPILLARY: 97 mg/dL (ref 65–99)
GLUCOSE-CAPILLARY: 187 mg/dL — AB (ref 65–99)
Glucose-Capillary: 261 mg/dL — ABNORMAL HIGH (ref 65–99)
Glucose-Capillary: 104 mg/dL — ABNORMAL HIGH (ref 65–99)

## 2016-05-19 MED ORDER — LOPERAMIDE HCL 2 MG PO CAPS
2.0000 mg | ORAL_CAPSULE | ORAL | Status: DC | PRN
  Administered 2016-05-19 – 2016-05-20 (×2): 2 mg via ORAL
  Filled 2016-05-19 (×2): qty 1

## 2016-05-19 MED ORDER — AMLODIPINE BESYLATE 5 MG PO TABS
5.0000 mg | ORAL_TABLET | Freq: Every day | ORAL | Status: DC
  Administered 2016-05-19 – 2016-05-20 (×2): 5 mg via ORAL
  Filled 2016-05-19 (×2): qty 1

## 2016-05-19 MED ORDER — SIMETHICONE 40 MG/0.6ML PO SUSP
80.0000 mg | Freq: Four times a day (QID) | ORAL | Status: DC
  Administered 2016-05-19 – 2016-05-23 (×15): 80 mg via ORAL
  Filled 2016-05-19 (×19): qty 1.2

## 2016-05-19 NOTE — Progress Notes (Signed)
[place] TEAM 1 - Stepdown/ICU TEAM  ATHZIRI MASTERMAN  [id number] DOB: Dec 20, 1940 DOA: 05/14/2016 PCP: Jani Gravel, MD    Brief Narrative:  76 y.o. female with history of dementia, severe rheumatoid arthritis on chronic steroids and Arava, stage III chronic kidney disease, diabetes, hypertension, COPD O2 dependent on 2 L, protein calorie malnutrition, chronic anemia and GERD who was last seen normal prior to going to bed. She then awakened the morning of her admit with blurry vision in the right eye and the inability to see out of the left eye. She noted left-sided weakness as well.   In the ED a CT angio of the head and neck was suggestive of a brainstem infarct.  She was also found to have a type B aortic dissection.  Subjective: The patient is having frequent watery stools.  She admits this is a common occurrence at home which she treats with Imodium.  She denies significant abdominal pain.  She is afebrile.  C. difficile rapid scan was accomplished on February 1 and was negative.  She does not appear toxic.  She continues to complain of her chronic and daily chest discomfort and shortness of breath but appears to be resting comfortably in bed.  Assessment & Plan:  Right paramedian midbrain, right occipital lobe, 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 -Patient presents with significant visual acuity changes associated with abnormal eye movement and left-sided weakness with diagnostic evidence of brainstem infarct -Neurology evaluated and suggests DAPT for 3 months and then plavix alone -B vertebral occlusions noted on CTa -awaiting D/C to SNF for rehab   Type B aortic dissection  -Incidental finding on CT angio of the neck -Vasc Surg has evaluated  -CTa chest notes "the area of concern on the proximal descending thoracic aorta is fairly localized and appears to be a localized dissection versus a penetrating ulcer" per VVS -to f/u w/  Dr. Scot Dock in 6 months for repeat CT -Focus on blood pressure control avoiding significant hypertension  Uncontrolled DM2 -CBGs remain quite labile with significant hypoglycemia this morning - the patient's husband tells me this is a typical occurrence for her and that her CBGs have always been quite labile - will not be safe for SNF d/c until CBG more consistently in a safe range given risk of severe hyoglycemia as well as hyperglycemia - adjust tx and support w/ dextrose IVF  Diarrhea  Unclear etiology but appears to be a chronic issue  - no suggestion of infectious etiology  - trial of Imodium as per home regimen   CKD Stage 3  -baseline crt 1.3 - crt has been stable - recheck in AM   Recent Labs Lab 05/15/16 1136 05/15/16 1430 05/16/16 0330 05/16/16 1502 05/17/16 1017  CREATININE 1.48* 1.45* 1.12* 1.10* 0.92    HTN  -BP continues to improve quite difficult to control with swings in systolic blood pressure from 100-223 - adjust treatment again today and follow trend - avoid attempts at overaggressive control as I fear severe episodic hypotension   HLD -Cont pravastatin  Chronic respiratory failure with hypoxemia / COPD  -on home O2 at 2 L/m - currently stable on home dose of O2  Rheumatoid arthritis  -On prednisone 10 mg daily along with [place] steroid tx - hold Arava for now   GERD  Iron deficiency anemia due to chronic blood loss -Baseline hemoglobin appears to be between 7.7 and 8.7 - Hgb presently better  than baseline    Recent Labs Lab 05/14/16 1111 05/14/16 1123 05/16/16 0720 05/17/16 1017  HGB 9.6* 9.2* 8.7* 9.3*    DVT prophylaxis: SCDs Code Status: FULL CODE Family Communication: Spoke with husband and daughter at bedside  Disposition Plan:  will be ready for SNF as soon as BP and CBG better controlled (?Monday)  Consultants:  Neurology  VVS  Procedures: none  Antimicrobials:  none   Objective: Blood pressure (!) 183/56, pulse 89,  temperature 97.6 F (36.4 C), temperature source Oral, resp. rate (!) 23, height 5' (1.524 m), weight 49.9 kg (110 lb), SpO2 100 %.  Intake/Output Summary (Last 24 hours) at 05/19/16 1319 Last data filed at 05/19/16 0910  Gross per 24 hour  Intake            220.5 ml  Output              980 ml  Net           -759.5 ml   Filed Weights   05/14/16 1109  Weight: 49.9 kg (110 lb)    Examination: General: No acute respiratory distress - alert and conversant  Lungs: Clear to auscultation bilaterally w/o wheeze  Cardiovascular: Regular rate and rhythm - no gallup or rub  Abdomen: Nontender, nondistended, soft, bowel sounds positive, no rebound Extremities: No significant edema bilateral lower extremities  CBC:  Recent Labs Lab 05/14/16 1111 05/14/16 1123 05/16/16 0720 05/17/16 1017  WBC 8.0  --  15.6* 12.0*  NEUTROABS 5.4  --   --   --   HGB 9.6* 9.2* 8.7* 9.3*  HCT 29.8* 27.0* 26.4* 28.5*  MCV 89.2  --  87.7 88.2  PLT 220  --  222 [id number]   Basic Metabolic Panel:  Recent Labs Lab 05/15/16 1136 05/15/16 1430 05/16/16 0330 05/16/16 1502 05/17/16 1017  NA 143 141 140 137 136  K 3.8 4.1 5.6* 3.5 3.4*  CL 103 103 103 102 101  CO2 32 29 27 23 26   GLUCOSE 59* 127* 123* 142* 241*  BUN 33* 32* 23* 17 9  CREATININE 1.48* 1.45* 1.12* 1.10* 0.92  CALCIUM 8.5* 8.3* 7.9* 7.6* 7.5*   GFR: Estimated Creatinine Clearance: 38 mL/min (by C-G formula based on SCr of 0.92 mg/dL).  Liver Function Tests:  Recent Labs Lab 05/14/16 1111 05/16/16 0330 05/17/16 1017  AST 15 55* 27  ALT 13* 19 17  ALKPHOS 85 81 72  BILITOT 0.8 1.2 0.8  PROT 5.5* 5.1* 4.8*  ALBUMIN 3.4* 3.3* 2.9*    Coagulation Profile:  Recent Labs Lab 05/14/16 1111  INR 0.89   HbA1C: Hemoglobin A1C  Date/Time Value Ref Range Status  02/02/2016 6.8  Final   Hgb A1c MFr Bld  Date/Time Value Ref Range Status  05/15/2016 04:29 AM 7.1 (H) 4.8 - 5.6 % Final    Comment:    (NOTE)         Pre-diabetes:  5.7 - 6.4         Diabetes: >6.4         Glycemic control for adults with diabetes: <7.0   05/14/2016 11:11 AM 7.1 (H) 4.8 - 5.6 % Final    Comment:    (NOTE)         Pre-diabetes: 5.7 - 6.4         Diabetes: >6.4         Glycemic control for adults with diabetes: <7.0     CBG:  Recent Labs Lab 05/18/16 1715  05/18/16 1746 05/18/16 2217 05/19/16 0850 05/19/16 1238  GLUCAP 31* 96 80 97 187*    Recent Results (from the past 240 hour(s))  MRSA PCR Screening     Status: None   Collection Time: 05/15/16  1:18 AM  Result Value Ref Range Status   MRSA by PCR NEGATIVE NEGATIVE Final    Comment:        The GeneXpert MRSA Assay (FDA approved for NASAL specimens only), is one component of a comprehensive MRSA colonization surveillance program. It is not intended to diagnose MRSA infection nor to guide or monitor treatment for MRSA infections.   C difficile quick scan w PCR reflex     Status: None   Collection Time: 05/16/16 10:01 AM  Result Value Ref Range Status   C Diff antigen NEGATIVE NEGATIVE Final   C Diff toxin NEGATIVE NEGATIVE Final   C Diff interpretation No C. difficile detected.  Final     Scheduled Meds: . aspirin  325 mg Oral Daily  . citalopram  10 mg Oral Daily  . clopidogrel  75 mg Oral Daily  . donepezil  10 mg Oral QHS  . insulin aspart  0-5 Units Subcutaneous QHS  . insulin aspart  0-9 Units Subcutaneous TID WC  . insulin detemir  10 Units Subcutaneous Daily  . metoprolol tartrate  100 mg Oral BID  . MUSCLE RUB  1 application Topical Daily  . pravastatin  20 mg Oral q1800  . predniSONE  10 mg Oral Daily    . dextrose 5 % and 0.9% NaCl 30 mL/hr at 05/18/16 2000    LOS: 5 days   Cherene Altes, MD [facility] Office  [phone] Pager - Text Page per Amion as per below:  On-Call/Text Page:      [email or web address]      password [email or web address]  If 7PM-7AM, please contact night-coverage [email or web address] Password [email or web address] 05/19/2016, 1:19 PM

## 2016-05-20 LAB — BASIC METABOLIC PANEL
Anion gap: 9 (ref 5–15)
BUN: 10 mg/dL (ref 6–20)
CALCIUM: 8.1 mg/dL — AB (ref 8.9–10.3)
CO2: 27 mmol/L (ref 22–32)
CREATININE: 0.99 mg/dL (ref 0.44–1.00)
Chloride: 100 mmol/L — ABNORMAL LOW (ref 101–111)
GFR calc non Af Amer: 54 mL/min — ABNORMAL LOW (ref >60)
Glucose, Bld: 62 mg/dL — ABNORMAL LOW (ref 65–99)
Potassium: 3.2 mmol/L — ABNORMAL LOW (ref 3.5–5.1)
SODIUM: 136 mmol/L (ref 135–145)

## 2016-05-20 LAB — CBC
HCT: 27.7 % — ABNORMAL LOW (ref 36.0–46.0)
Hemoglobin: 8.9 g/dL — ABNORMAL LOW (ref 12.0–15.0)
MCH: 28 pg (ref 26.0–34.0)
MCHC: 32.1 g/dL (ref 30.0–36.0)
MCV: 87.1 fL (ref 78.0–100.0)
PLATELETS: 207 10*3/uL (ref 150–400)
RBC: 3.18 MIL/uL — AB (ref 3.87–5.11)
RDW: 13.6 % (ref 11.5–15.5)
WBC: 8.4 10*3/uL (ref 4.0–10.5)

## 2016-05-20 LAB — GLUCOSE, CAPILLARY
GLUCOSE-CAPILLARY: 101 mg/dL — AB (ref 65–99)
GLUCOSE-CAPILLARY: 148 mg/dL — AB (ref 65–99)
GLUCOSE-CAPILLARY: 279 mg/dL — AB (ref 65–99)
GLUCOSE-CAPILLARY: 34 mg/dL — AB (ref 65–99)
GLUCOSE-CAPILLARY: 45 mg/dL — AB (ref 65–99)
GLUCOSE-CAPILLARY: 92 mg/dL (ref 65–99)
Glucose-Capillary: 44 mg/dL — CL (ref 65–99)
Glucose-Capillary: 46 mg/dL — ABNORMAL LOW (ref 65–99)
Glucose-Capillary: 172 mg/dL — ABNORMAL HIGH (ref 65–99)

## 2016-05-20 MED ORDER — INSULIN ASPART 100 UNIT/ML ~~LOC~~ SOLN: 3.0000 [IU] | Freq: Three times a day (TID) | SUBCUTANEOUS | Status: DC

## 2016-05-20 MED ORDER — AMLODIPINE BESYLATE 10 MG PO TABS
10.0000 mg | ORAL_TABLET | Freq: Every day | ORAL | Status: DC
  Administered 2016-05-21 – 2016-05-23 (×3): 10 mg via ORAL
  Filled 2016-05-20 (×3): qty 1

## 2016-05-20 MED ORDER — INSULIN DETEMIR 100 UNIT/ML ~~LOC~~ SOLN
8.0000 [IU] | Freq: Every day | SUBCUTANEOUS | Status: DC
Start: 2016-05-20 — End: 2016-05-21
  Administered 2016-05-20: 8 [IU] via SUBCUTANEOUS
  Filled 2016-05-20 (×2): qty 0.08

## 2016-05-20 MED ORDER — POTASSIUM CHLORIDE CRYS ER 20 MEQ PO TBCR
40.0000 meq | EXTENDED_RELEASE_TABLET | Freq: Once | ORAL | Status: AC
  Administered 2016-05-20: 40 meq via ORAL
  Filled 2016-05-20: qty 2

## 2016-05-20 MED ORDER — DEXTROSE 50 % IV SOLN
1.0000 | INTRAVENOUS | Status: DC | PRN
  Administered 2016-05-20: 50 mL via INTRAVENOUS
  Filled 2016-05-20: qty 50

## 2016-05-20 MED ORDER — DEXTROSE 50 % IV SOLN
INTRAVENOUS | Status: AC
  Administered 2016-05-20: 25 g
  Filled 2016-05-20: qty 50

## 2016-05-20 NOTE — Progress Notes (Addendum)
Inpatient Diabetes Program Recommendations  AACE/ADA: New Consensus Statement on Inpatient Glycemic Control (2015)  Target Ranges:  Prepandial:   less than 140 mg/dL      Peak postprandial:   less than 180 mg/dL (1-2 hours)      Critically ill patients:  140 - 180 mg/dL   Lab Results  Component Value Date   GLUCAP 101 (H) 05/20/2016   HGBA1C 7.1 (H) 05/15/2016   Results for LOLITTA, [name] (MRN [id number]) as of 05/20/2016 10:51  Ref. Range 05/19/2016 08:50 05/19/2016 12:38 05/19/2016 16:23 05/19/2016 21:08 05/20/2016 06:21 05/20/2016 08:31  Glucose-Capillary Latest Ref Range: 65 - 99 mg/dL 97 187 (H) 261 (H)   104 (H) 34 (LL) 101 (H)   Review of Glycemic Control  Diabetes history: DM2  Current orders for Inpatient glycemic control:     Levemir 8 units daily (starting today),    Sensitive correction scale Novolog 0-9 units TIDAC and 0-5 units QHS  Inpatient Diabetes Program Recommendations:     Please consider decreasing Levemir to 6 units daily.     Please consider Meal coverage of 3 units TIDAC if patient eats < 50% of meal.     Patient with poor po intake, please consider Glucerna meal supplement TID if patient does not eat > 50% of meal.  Spoke with patient at bedside regarding self-management of diabetes.  1.  Taught patient the following (teach back and/or return demonstration):  Hypo/Hyperglycemia  DM Medications (what these are, why taking, when taking, how taking, common S.E.'s)  Carb modified diet and Glucerna supplement to help increase po intake  2.  Identified barriers and facilitators to self-management goals:  Patient does not eat well at home, may need to have supplements at discharge  Patient understands DM and DM medications  3.  Identified support systems:  None mentioned  Thank you,  Windy Carina, RN, MSN Diabetes Coordinator Inpatient Diabetes Program [phone] (Team Pager)

## 2016-05-20 NOTE — Progress Notes (Signed)
OT Cancellation Note  Patient Details Name: Marisa [name] MRN: [id number] DOB: Jan 21, 1941   Cancelled Treatment:    Reason Eval/Treat Not Completed: Medical issues which prohibited therapy - pt with hypoglycemic event.   [place], OTR/L [id number]   Lucille Passy M 05/20/2016, 7:20 PM

## 2016-05-20 NOTE — Progress Notes (Signed)
[place] TEAM 1 - Stepdown/ICU TEAM  BLAYRE BUCHKO  [id number] DOB: Nov 13, 1940 DOA: 05/14/2016 PCP: Jani Gravel, MD    Brief Narrative:  76 y.o. female with history of dementia, severe rheumatoid arthritis on chronic steroids and Arava, stage III chronic kidney disease, diabetes, hypertension, COPD O2 dependent on 2 L, protein calorie malnutrition, chronic anemia and GERD who was last seen normal prior to going to bed. She then awakened the morning of her admit with blurry vision in the right eye and the inability to see out of the left eye. She noted left-sided weakness as well.   In the ED a CT angio of the head and neck was suggestive of a brainstem infarct.  She was also found to have a type B aortic dissection.  Subjective: For the first time during this hospitalization the patient tells me she "feels great."  She denies chest pain shortness of breath nausea vomiting or abdominal pain.  She reports that her intake has improved.  Assessment & Plan:  Uncontrolled DM2 -CBG trend overall has improved but patient still had episode of severe hypoglycemia to 34 early this morning - the patient's husband tells me this is a typical occurrence for her and that her CBGs have always been quite labile - will not be safe for SNF d/c until CBG more consistently in a safe range given risk of severe hyoglycemia as well as hyperglycemia - adjust tx - attempt to stop dextrose and IV  HTN  -BP continues to prove quite difficult to control - with systolic more consistently elevated today will adjust medical therapy again and follow   Right paramedian midbrain, right occipital lobe, 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 -Patient presents with significant visual acuity changes associated with abnormal eye movement and left-sided weakness with diagnostic evidence of brainstem infarct -Neurology evaluated and suggests DAPT for 3 months and then plavix  alone -B vertebral occlusions noted on CTa -awaiting D/C to SNF for rehab [facility])  Type B aortic dissection  -Incidental finding on CT angio of the neck -Vasc Surg has evaluated  -CTa chest notes "the area of concern on the proximal descending thoracic aorta is fairly localized and appears to be a localized dissection versus a penetrating ulcer" per VVS -to f/u w/ Dr. Scot Dock in 6 months for repeat CT -Focus on blood pressure control avoiding significant hypertension  Diarrhea  Unclear etiology but appears to be a chronic issue  - no suggestion of infectious etiology  - trial of Imodium as per home regimen has improved this significantly  CKD Stage 3  -baseline crt 1.3 - crt stable and better than baseline  Recent Labs Lab 05/15/16 1430 05/16/16 0330 05/16/16 1502 05/17/16 1017 05/20/16 0146  CREATININE 1.45* 1.12* 1.10* 0.92 0.99   HLD -Cont pravastatin  Chronic respiratory failure with hypoxemia / COPD  -on home O2 at 2 L/m - currently stable on home dose of O2  Rheumatoid arthritis  -On prednisone 10 mg daily along with Arava - cont steroid tx - hold Arava for now   GERD  Iron deficiency anemia due to chronic blood loss -Baseline hemoglobin appears to be between 7.7 and 8.7 - Hgb presently better than baseline and stable  Recent Labs Lab 05/14/16 1111 05/14/16 1123 05/16/16 0720 05/17/16 1017 05/20/16 0146  HGB 9.6* 9.2* 8.7* 9.3* 8.9*   Hypokalemia Likely due to GI loss - replace and follow  DVT prophylaxis: SCDs Code Status:  FULL CODE Family Communication: No family present at time of exam today  Disposition Plan:  will be ready for SNF as soon as BP and CBG better controlled (?Tuesday)  Consultants:  Neurology  VVS  Procedures: none  Antimicrobials:  none   Objective: Blood pressure (!) 165/105, pulse (!) 109, temperature 97.7 F (36.5 C), temperature source Axillary, resp. rate (!) 24, height 5' (1.524 m), weight 49.9 kg (110 lb),  SpO2 95 %.  Intake/Output Summary (Last 24 hours) at 05/20/16 1512 Last data filed at 05/20/16 1228  Gross per 24 hour  Intake              424 ml  Output             1025 ml  Net             -601 ml   Filed Weights   05/14/16 1109  Weight: 49.9 kg (110 lb)    Examination: General: No acute respiratory distress - alert and pleasant  Lungs: Distant breath sounds in all fields with no wheezing and no focal crackles Cardiovascular: Regular rate and rhythm  Abdomen: Nontender, nondistended, soft, bowel sounds positive, no rebound Extremities: No significant edema B lower extremities  CBC:  Recent Labs Lab 05/14/16 1111 05/14/16 1123 05/16/16 0720 05/17/16 1017 05/20/16 0146  WBC 8.0  --  15.6* 12.0* 8.4  NEUTROABS 5.4  --   --   --   --   HGB 9.6* 9.2* 8.7* 9.3* 8.9*  HCT 29.8* 27.0* 26.4* 28.5* 27.7*  MCV 89.2  --  87.7 88.2 87.1  PLT 220  --  222 184 [id number]   Basic Metabolic Panel:  Recent Labs Lab 05/15/16 1430 05/16/16 0330 05/16/16 1502 05/17/16 1017 05/20/16 0146  NA 141 140 137 136 136  K 4.1 5.6* 3.5 3.4* 3.2*  CL 103 103 102 101 100*  CO2 29 27 23 26 27   GLUCOSE 127* 123* 142* 241* 62*  BUN 32* 23* 17 9 10   CREATININE 1.45* 1.12* 1.10* 0.92 0.99  CALCIUM 8.3* 7.9* 7.6* 7.5* 8.1*   GFR: Estimated Creatinine Clearance: 35.3 mL/min (by C-G formula based on SCr of 0.99 mg/dL).  Liver Function Tests:  Recent Labs Lab 05/14/16 1111 05/16/16 0330 05/17/16 1017  AST 15 55* 27  ALT 13* 19 17  ALKPHOS 85 81 72  BILITOT 0.8 1.2 0.8  PROT 5.5* 5.1* 4.8*  ALBUMIN 3.4* 3.3* 2.9*    Coagulation Profile:  Recent Labs Lab 05/14/16 1111  INR 0.89   HbA1C: Hemoglobin A1C  Date/Time Value Ref Range Status  02/02/2016 6.8  Final   Hgb A1c MFr Bld  Date/Time Value Ref Range Status  05/15/2016 04:29 AM 7.1 (H) 4.8 - 5.6 % Final    Comment:    (NOTE)         Pre-diabetes: 5.7 - 6.4         Diabetes: >6.4         Glycemic control for adults with  diabetes: <7.0   05/14/2016 11:11 AM 7.1 (H) 4.8 - 5.6 % Final    Comment:    (NOTE)         Pre-diabetes: 5.7 - 6.4         Diabetes: >6.4         Glycemic control for adults with diabetes: <7.0     CBG:  Recent Labs Lab 05/19/16 1623 05/19/16 2108 05/20/16 0621 05/20/16 0831 05/20/16 1225  GLUCAP 261* 104* 34* 101*  279*    Recent Results (from the past 240 hour(s))  MRSA PCR Screening     Status: None   Collection Time: 05/15/16  1:18 AM  Result Value Ref Range Status   MRSA by PCR NEGATIVE NEGATIVE Final    Comment:        The GeneXpert MRSA Assay (FDA approved for NASAL specimens only), is one component of a comprehensive MRSA colonization surveillance program. It is not intended to diagnose MRSA infection nor to guide or monitor treatment for MRSA infections.   C difficile quick scan w PCR reflex     Status: None   Collection Time: 05/16/16 10:01 AM  Result Value Ref Range Status   C Diff antigen NEGATIVE NEGATIVE Final   C Diff toxin NEGATIVE NEGATIVE Final   C Diff interpretation No C. difficile detected.  Final     Scheduled Meds: . amLODipine  5 mg Oral Daily  . aspirin  325 mg Oral Daily  . citalopram  10 mg Oral Daily  . clopidogrel  75 mg Oral Daily  . donepezil  10 mg Oral QHS  . insulin aspart  0-5 Units Subcutaneous QHS  . insulin aspart  0-9 Units Subcutaneous TID WC  . insulin detemir  8 Units Subcutaneous Daily  . metoprolol tartrate  100 mg Oral BID  . MUSCLE RUB  1 application Topical Daily  . potassium chloride  40 mEq Oral Once  . pravastatin  20 mg Oral q1800  . predniSONE  10 mg Oral Daily  . simethicone  80 mg Oral QID      LOS: 6 days   Cherene Altes, MD [facility] Office  [phone] Pager - Text Page per Amion as per below:  On-Call/Text Page:      [email or web address]      password [email or web address]  If 7PM-7AM, please contact night-coverage [email or web address] Password [email or web address] 05/20/2016, 3:12 PM

## 2016-05-20 NOTE — Progress Notes (Signed)
Physical Therapy Treatment Patient Details Name: Marisa [name] MRN: [id number] DOB: 08-09-1940 Today's Date: 05/20/2016    History of Present Illness Pt is a 76 y/o female admitted secondary to visual abnormalties and L sided weakness found to have a brainstem infarct. PMH including but not limited to dementia, DM, HTN, RA and NSTEMI in 2012.    PT Comments    Pt pleasant and willing to mobilize with encouragement and reassurance. Pt with frequent urination having to void 2x during session and NT applied brief for being OOB. Pt with improved ability with bed level mobility and pivot. Pt limited by anxiety, decreased awareness and impaired balance with gait. Will continue to follow.   HR 109-146 with end of gait as anxiety increased with return to 125 upon sitting BP immediately after gait 191/89 After sitting 2 min 165/105 sats 92-96% throughout on RA  Follow Up Recommendations  SNF;Supervision/Assistance - 24 hour     Equipment Recommendations       Recommendations for Other Services       Precautions / Restrictions Precautions Precautions: Fall    Mobility  Bed Mobility Overal bed mobility: Needs Assistance Bed Mobility: Supine to Sit     Supine to sit: Min guard;HOB elevated     General bed mobility comments: pt with use of rail and minguard to pivot to EOB with increased time  Transfers Overall transfer level: Needs assistance   Transfers: Sit to/from Stand;Stand Pivot Transfers Sit to Stand: Min assist Stand pivot transfers: Mod assist       General transfer comment: min assist to stand from bed, BSC and chair x 4 trials with cues for hand placement and safety. Mod assist to pivot and control pelvis for bed>BSC, chair <>BSC  Ambulation/Gait Ambulation/Gait assistance: Max assist;+2 physical assistance;+2 safety/equipment Ambulation Distance (Feet): 12 Feet Assistive device: Rolling walker (2 wheeled);2 person hand held assist Gait Pattern/deviations:  Step-to pattern;Shuffle;Trunk flexed   Gait velocity interpretation: Below normal speed for age/gender General Gait Details: pt initially able to walk 4' with RW with min assist +1 for lines, pt became anxious with gait, with increasing usteadiness with anterior lean, decreased control of RW and required max +2 to step another 2'. Took away RW and used bil HHA with increased stability and control as pt anxiety decreased with HHA with continued 2 person mod Assist   Stairs            Wheelchair Mobility    Modified Rankin (Stroke Patients Only) Modified Rankin (Stroke Patients Only) Pre-Morbid Rankin Score: Moderately severe disability Modified Rankin: Moderately severe disability     Balance Overall balance assessment: Needs assistance   Sitting balance-Leahy Scale: Fair       Standing balance-Leahy Scale: Poor                      Cognition Arousal/Alertness: Awake/alert Behavior During Therapy: Anxious Overall Cognitive Status: No family/caregiver present to determine baseline cognitive functioning                 General Comments: pt with dementia at baseline    Exercises General Exercises - Lower Extremity Hip ABduction/ADduction: AROM;Both;Seated;10 reps    General Comments        Pertinent Vitals/Pain Pain Assessment: 0-10 Pain Score: 6  Pain Location: everywhere Pain Descriptors / Indicators: Aching;Sore Pain Intervention(s): Limited activity within patient's tolerance;Repositioned    Home Living  Prior Function            PT Goals (current goals can now be found in the care plan section) Progress towards PT goals: Progressing toward goals    Frequency    Min 3X/week      PT Plan Current plan remains appropriate;Frequency needs to be updated    Co-evaluation             End of Session Equipment Utilized During Treatment: Gait belt Activity Tolerance: Patient tolerated treatment  well Patient left: in chair;with call bell/phone within reach;with chair alarm set;with nursing/sitter in room     Time: [id number] PT Time Calculation (min) (ACUTE ONLY): 26 min  Charges:  $Gait Training: 8-22 mins $Therapeutic Activity: 8-22 mins                    G Codes:      Marisa [name] B Marisa [name] Jun 15, 2016, 12:33 PM Elwyn Reach, [place]

## 2016-05-20 NOTE — Clinical Social Work Note (Signed)
CSW continues to follow for discharge needs.  Anayeli Arel, CSW [phone]  

## 2016-05-20 NOTE — Care Management Important Message (Signed)
Important Message  Patient Details  Name: Marisa [name] MRN: [id number] Date of Birth: Jun 26, 1940   Medicare Important Message Given:  Yes    Orbie Pyo 05/20/2016, 4:33 PM

## 2016-05-20 NOTE — Progress Notes (Signed)
Pt called RN to room states she feels like her blood sugar is low.  CBG 34, 1 amp D50 given.  Will continue to monitor.

## 2016-05-21 DIAGNOSIS — N183 Chronic kidney disease, stage 3 unspecified: Secondary | ICD-10-CM

## 2016-05-21 DIAGNOSIS — J9611 Chronic respiratory failure with hypoxia: Secondary | ICD-10-CM

## 2016-05-21 DIAGNOSIS — J431 Panlobular emphysema: Secondary | ICD-10-CM

## 2016-05-21 LAB — GLUCOSE, CAPILLARY
GLUCOSE-CAPILLARY: 210 mg/dL — AB (ref 65–99)
GLUCOSE-CAPILLARY: 171 mg/dL — AB (ref 65–99)
GLUCOSE-CAPILLARY: 207 mg/dL — AB (ref 65–99)
Glucose-Capillary: 236 mg/dL — ABNORMAL HIGH (ref 65–99)
Glucose-Capillary: 121 mg/dL — ABNORMAL HIGH (ref 65–99)
Glucose-Capillary: 227 mg/dL — ABNORMAL HIGH (ref 65–99)

## 2016-05-21 LAB — BASIC METABOLIC PANEL
Anion gap: 7 (ref 5–15)
BUN: 7 mg/dL (ref 6–20)
CALCIUM: 8.3 mg/dL — AB (ref 8.9–10.3)
CO2: 32 mmol/L (ref 22–32)
CREATININE: 0.98 mg/dL (ref 0.44–1.00)
Chloride: 95 mmol/L — ABNORMAL LOW (ref 101–111)
GFR calc Af Amer: >60 mL/min (ref >60)
GFR, EST NON AFRICAN AMERICAN: 55 mL/min — AB (ref >60)
GLUCOSE: 224 mg/dL — AB (ref 65–99)
Potassium: 4.4 mmol/L (ref 3.5–5.1)
SODIUM: 134 mmol/L — AB (ref 135–145)

## 2016-05-21 MED ORDER — DIPHENHYDRAMINE HCL 12.5 MG/5ML PO ELIX
12.5000 mg | ORAL_SOLUTION | Freq: Once | ORAL | Status: AC
  Administered 2016-05-21: 12.5 mg via ORAL
  Filled 2016-05-21: qty 5

## 2016-05-21 MED ORDER — LISINOPRIL 2.5 MG PO TABS
2.5000 mg | ORAL_TABLET | Freq: Every day | ORAL | Status: DC
  Administered 2016-05-21 – 2016-05-23 (×3): 2.5 mg via ORAL
  Filled 2016-05-21 (×3): qty 1

## 2016-05-21 MED ORDER — INSULIN ASPART 100 UNIT/ML ~~LOC~~ SOLN
5.0000 [IU] | Freq: Three times a day (TID) | SUBCUTANEOUS | Status: DC
  Administered 2016-05-22 (×2): 5 [IU] via SUBCUTANEOUS

## 2016-05-21 MED ORDER — INSULIN DETEMIR 100 UNIT/ML ~~LOC~~ SOLN
5.0000 [IU] | Freq: Every day | SUBCUTANEOUS | Status: DC
  Administered 2016-05-22: 5 [IU] via SUBCUTANEOUS
  Filled 2016-05-21 (×2): qty 0.05

## 2016-05-21 NOTE — Progress Notes (Signed)
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 PMH including but not limited to dementia, DM, HTN, RA and NSTEMI in 2012.    PT Comments    Patient seen for mobility progression. Making progress towards PT goals. Tolerated activity well however, session intermittently limited by urinary frequency. Patient VSS throughout session on 1.5 liters with BPs 120s/70s. Patient anxious with activity. Will continue to see and progress as tolerated.  Follow Up Recommendations  SNF;Supervision/Assistance - 24 hour     Equipment Recommendations  None recommended by PT    Recommendations for Other Services       Precautions / Restrictions Precautions Precautions: Fall Restrictions Weight Bearing Restrictions: No    Mobility  Bed Mobility Overal bed mobility: Needs Assistance Bed Mobility: Rolling;Supine to Sit Rolling: Min assist   Supine to sit: Min guard;HOB elevated Sit to supine: Min assist      Transfers Overall transfer level: Needs assistance Equipment used: 2 person hand held assist Transfers: Sit to/from [facility] Sit to Stand: Min assist Stand pivot transfers: Mod assist;+2 physical assistance       General transfer comment: Min assist to elevate from bed, moderate assist for stability during transfer to [facility]. VCs for hand placement upon elevation to upright from [facility]  Ambulation/Gait Ambulation/Gait assistance: Max assist;+2 physical assistance;+2 safety/equipment Ambulation Distance (Feet): 16 Feet Assistive device: 2 person hand held assist Gait Pattern/deviations: Step-to pattern;Shuffle;Trunk flexed Gait velocity: decreased   General Gait Details: patient required 2 person max assist for ambulation, significantly flexed  posture with heavy UE support via arms. Saturations stable on 1.5 liters with ambulation >96%   Stairs            Wheelchair Mobility    Modified Rankin (Stroke Patients Only) Modified Rankin (Stroke Patients Only) Pre-Morbid Rankin Score: Moderately severe disability Modified Rankin: Moderately severe disability     Balance Overall balance assessment: Needs assistance Sitting-balance support: Bilateral upper extremity supported Sitting balance-Leahy Scale: Fair Sitting balance - Comments: able to sit EOB without assist    Standing balance support: Bilateral upper extremity supported Standing balance-Leahy Scale: Poor                      Cognition Arousal/Alertness: Awake/alert Behavior During Therapy: Anxious Overall Cognitive Status: No family/caregiver present to determine baseline cognitive functioning                 General Comments: pt with dementia at baseline    Exercises      General Comments General comments (skin integrity, edema, etc.): patient assisted with hygiene and pericare x3 during session      Pertinent Vitals/Pain Pain Assessment: 0-10 Pain Score: 8  Pain Location: everywhere Pain Descriptors / Indicators: Aching;Sore Pain Intervention(s): Monitored during session;Limited activity within patient's tolerance    Home Living Family/patient expects to be discharged to:: Unsure Living Arrangements: Spouse/significant other Available Help at Discharge: Family;Available 24 hours/day Type of Home: House         Additional Comments: Pt lives at home with her husband who is her primary caregiver. Pt with dementia at baseline with no caregivers or family members present during evaluation.    Prior Function Level of Independence: Needs assistance  Gait / Transfers Assistance Needed: ambulating mod I with  cane ADL's / Homemaking Assistance Needed: assisted for bathing, dressing and all IADL, reports being able to self feed      PT Goals (current goals can now be found in the care plan section) Acute Rehab PT Goals Patient Stated Goal: to see her husband, Marisa [name] PT Goal Formulation: Patient unable to participate in goal setting Time For Goal Achievement: 05/29/16 Potential to Achieve Goals: Fair Progress towards PT goals: Progressing toward goals    Frequency    Min 3X/week      PT Plan Current plan remains appropriate    Co-evaluation PT/OT/SLP Co-Evaluation/Treatment: Yes Reason for Co-Treatment: For patient/therapist safety PT goals addressed during session: Mobility/safety with mobility OT goals addressed during session: ADL's and self-care     End of Session Equipment Utilized During Treatment: Gait belt Activity Tolerance: Patient tolerated treatment well Patient left: in chair;with call bell/phone within reach;with chair alarm set;with nursing/sitter in room     Time: [id number] PT Time Calculation (min) (ACUTE ONLY): 30 min  Charges:  $Therapeutic Activity: 8-22 mins                    G Codes:      Duncan Dull 05/27/16, [place], PT DPT  [phone]

## 2016-05-21 NOTE — Progress Notes (Signed)
PROGRESS NOTE    Marisa [name]  [id number] DOB: 13-Jun-1940 DOA: 05/14/2016 PCP: Jani Gravel, MD   Brief Narrative:  76 y.o.WF PMHx Dementia, Severe RA, on Chronic steroids and Arava, CKD stage III , DM type II uncontrolled with complications, HTN, COPD (O2 dependent on 2 L), protein calorie malnutrition,chronic anemia and GERD   Who was last seen normal prior to going to bed. She then awakened the morning of her admit with blurry vision in the right eye and the inability to see out of the left eye. She noted left-sided weakness as well.   In the ED a CT angio of the head and neck was suggestive of a brainstem infarct.  She was also found to have a type B aortic dissection.    Subjective: 2/6 A/O 3 (does not know when). Patient confused on current issues example unable to push button for help from nursing. States he pulled to not like her which is why he can't somehow (baseline dementia vs CVA )     Assessment & Plan:   Principal Problem:   Brainstem infarct, acute ([place]) Active Problems:   HTN (hypertension)   COPD (chronic obstructive pulmonary disease) (HCC)   GERD   Protein calorie malnutrition (HCC)   Chronic respiratory failure (HCC)   Iron deficiency anemia due to chronic blood loss   Rheumatoid arthritis (HCC)   Insulin dependent type 2 diabetes mellitus, uncontrolled (HCC)   Chronic renal insufficiency, stage 3 (moderate)   Aortic dissection (HCC)   Stroke (HCC)   Diplopia   Uncontrolled DM Type 2 with complications -CBG trend overall has improved but patient still had episode of severe hypoglycemia to 34 early this morning  - Per chart the patient's husband has stated  typical occurrence for her and that her CBGs have always been quite labile -Patient's CBGs will need to be better controlled in order for her to be safe for discharge to SNF. Will allow to run on the high side. Would make further small adjustments as outpatient -Decrease Levemir 5 units  daily -Increase NovoLog 5 units QAC  -Sensitive SSI    HTN  -Amlodipine 10 mg -Start lisinopril 2.5 mg daily -Metoprolol 100 mg BID    Right paramedian midbrain, right occipital lobe, 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 -Patient presents with significant visual acuity changes associated with abnormal eye movement and left-sided weakness with diagnostic evidence of brainstem infarct -Neurology evaluated and suggests DAPT for 3 months and then plavix alone -B vertebral occlusions noted on CTa -awaiting D/C to SNF for rehab ([place])  Type B aortic dissection  -Incidental finding on CT angio of the neck -Vasc Surg has evaluated  -CTa chest notes "the area of concern on the proximal descending thoracic aorta is fairly localized and appears to be a localized dissection versus a penetrating ulcer" per VVS -to f/u w/ Dr. Scot Dock in 6 months for repeat CT -Focus on blood pressure control avoiding significant hypertension  Diarrhea  Unclear etiology but appears to be a chronic issue  - no suggestion of infectious etiology  - trial of Imodium as per home regimen has improved this significantly  CKD Stage 3  -baseline crt 1.3 - crt stable and better than baseline Lab Results  Component Value Date   CREATININE 0.98 05/21/2016   CREATININE 0.99 05/20/2016   CREATININE 0.92 05/17/2016   HLD -Cont pravastatin  Chronic respiratory failurewith hypoxemia / COPD  -on home O2  at 2 L/m - currently stable on home dose of O2  Rheumatoid arthritis  -On prednisone 10 mg daily along with [place] steroid tx - hold Arava for now   GERD  Iron deficiency anemia due to chronic blood loss -Baseline hemoglobin appears to bebetween 7.7 and 8.7 - Hgb presently better than baseline and stable Recent Labs Lab 05/14/16 1111 05/14/16 1123 05/16/16 0720 05/17/16 1017 05/20/16 0146  HGB 9.6* 9.2* 8.7* 9.3* 8.9*    Hypokalemia -Likely due to GI loss - replace and follow  Goals of care -2/6 PT/OT: Recommended SNF if patient's BP and CBG continued to be fairly well controlled as soon his bed offer comes through could be discharged.    DVT prophylaxis: SCD Code Status: Full Family Communication: None Disposition Plan: AS soon as bed offer comes through SNF   Consultants:  Neurology  VVS   Procedures/Significant Events:     VENTILATOR SETTINGS:    Cultures   Antimicrobials:    Devices    LINES / TUBES:      Continuous Infusions:   Objective: Vitals:   05/20/16 2155 05/20/16 2316 05/21/16 0228 05/21/16 0706  BP: (!) 160/74 (!) 186/77 (!) 175/71 (!) 158/92  Pulse:  62 71 94  Resp:  (!) 22 (!) 24 (!) 30  Temp:  97.9 F (36.6 C) 97.9 F (36.6 C) 97.8 F (36.6 C)  TempSrc:  Oral Oral Oral  SpO2:  100% 100% 99%  Weight:      Height:        Intake/Output Summary (Last 24 hours) at 05/21/16 0821 Last data filed at 05/21/16 0500  Gross per 24 hour  Intake              720 ml  Output             1350 ml  Net             -630 ml   Filed Weights   05/14/16 1109  Weight: 49.9 kg (110 lb)    Examination:  General:A/O 3 (does not know when). Patient confused on current issues, No acute respiratory distress Eyes: negative scleral hemorrhage, negative anisocoria, negative icterus ENT: Negative Runny nose, negative gingival bleeding, Neck:  Negative scars, masses, torticollis, lymphadenopathy, JVD Lungs: Clear to auscultation bilaterally without wheezes or crackles Cardiovascular: Regular rate and rhythm without murmur gallop or rub normal S1 and S2 Abdomen: negative abdominal pain, nondistended, positive soft, bowel sounds, no rebound, no ascites, no appreciable mass Extremities: bilateral swan-neck deformity of hands Skin: Negative rashes, lesions, ulcers Psychiatric:  Dementia, paranoia (believes people do not like her here at the hospital) Central nervous  system:  Cranial nerves II through XII intact, tongue/uvula midline, all extremities muscle strength 5/5, sensation intact throughout,  negative dysarthria, negative expressive aphasia, negative receptive aphasia.  .     Data Reviewed: Care during the described time interval was provided by me .  I have reviewed this patient's available data, including medical history, events of note, physical examination, and all test results as part of my evaluation. I have personally reviewed and interpreted all radiology studies.  CBC:  Recent Labs Lab 05/14/16 1111 05/14/16 1123 05/16/16 0720 05/17/16 1017 05/20/16 0146  WBC 8.0  --  15.6* 12.0* 8.4  NEUTROABS 5.4  --   --   --   --   HGB 9.6* 9.2* 8.7* 9.3* 8.9*  HCT 29.8* 27.0* 26.4* 28.5* 27.7*  MCV 89.2  --  87.7 88.2  87.1  PLT 220  --  222 184 [id number]   Basic Metabolic Panel:  Recent Labs Lab 05/16/16 0330 05/16/16 1502 05/17/16 1017 05/20/16 0146 05/21/16 0341  NA 140 137 136 136 134*  K 5.6* 3.5 3.4* 3.2* 4.4  CL 103 102 101 100* 95*  CO2 27 23 26 27  32  GLUCOSE 123* 142* 241* 62* 224*  BUN 23* 17 9 10 7   CREATININE 1.12* 1.10* 0.92 0.99 0.98  CALCIUM 7.9* 7.6* 7.5* 8.1* 8.3*   GFR: Estimated Creatinine Clearance: 35.6 mL/min (by C-G formula based on SCr of 0.98 mg/dL). Liver Function Tests:  Recent Labs Lab 05/14/16 1111 05/16/16 0330 05/17/16 1017  AST 15 55* 27  ALT 13* 19 17  ALKPHOS 85 81 72  BILITOT 0.8 1.2 0.8  PROT 5.5* 5.1* 4.8*  ALBUMIN 3.4* 3.3* 2.9*   No results for input(s): LIPASE, AMYLASE in the last 168 hours. No results for input(s): AMMONIA in the last 168 hours. Coagulation Profile:  Recent Labs Lab 05/14/16 1111  INR 0.89   Cardiac Enzymes: No results for input(s): CKTOTAL, CKMB, CKMBINDEX, TROPONINI in the last 168 hours. BNP (last 3 results) No results for input(s): PROBNP in the last 8760 hours. HbA1C: No results for input(s): HGBA1C in the last 72 hours. CBG:  Recent Labs Lab  05/20/16 1844 05/20/16 2103 05/20/16 2354 05/21/16 0358 05/21/16 0731  GLUCAP 172* 148* 92 210* 236*   Lipid Profile: No results for input(s): CHOL, HDL, LDLCALC, TRIG, CHOLHDL, LDLDIRECT in the last 72 hours. Thyroid Function Tests: No results for input(s): TSH, T4TOTAL, FREET4, T3FREE, THYROIDAB in the last 72 hours. Anemia Panel: No results for input(s): VITAMINB12, FOLATE, FERRITIN, TIBC, IRON, RETICCTPCT in the last 72 hours. Urine analysis:    Component Value Date/Time   COLORURINE YELLOW 05/14/2016 1359   APPEARANCEUR CLEAR 05/14/2016 1359   LABSPEC 1.012 05/14/2016 1359   PHURINE 5.0 05/14/2016 1359   GLUCOSEU >=500 (A) 05/14/2016 1359   HGBUR NEGATIVE 05/14/2016 1359   BILIRUBINUR NEGATIVE 05/14/2016 1359   KETONESUR 5 (A) 05/14/2016 1359   PROTEINUR 100 (A) 05/14/2016 1359   UROBILINOGEN 0.2 05/28/2014 1021   NITRITE NEGATIVE 05/14/2016 1359   LEUKOCYTESUR NEGATIVE 05/14/2016 1359   Sepsis Labs: @LABRCNTIP (procalcitonin:4,lacticidven:4)  ) Recent Results (from the past 240 hour(s))  MRSA PCR Screening     Status: None   Collection Time: 05/15/16  1:18 AM  Result Value Ref Range Status   MRSA by PCR NEGATIVE NEGATIVE Final    Comment:        The GeneXpert MRSA Assay (FDA approved for NASAL specimens only), is one component of a comprehensive MRSA colonization surveillance program. It is not intended to diagnose MRSA infection nor to guide or monitor treatment for MRSA infections.   C difficile quick scan w PCR reflex     Status: None   Collection Time: 05/16/16 10:01 AM  Result Value Ref Range Status   C Diff antigen NEGATIVE NEGATIVE Final   C Diff toxin NEGATIVE NEGATIVE Final   C Diff interpretation No C. difficile detected.  Final         Radiology Studies: No results found.      Scheduled Meds: . amLODipine  10 mg Oral Daily  . aspirin  325 mg Oral Daily  . citalopram  10 mg Oral Daily  . clopidogrel  75 mg Oral Daily  .  donepezil  10 mg Oral QHS  . insulin aspart  0-5 Units Subcutaneous QHS  .  insulin aspart  0-9 Units Subcutaneous TID WC  . insulin aspart  3 Units Subcutaneous TID WC  . insulin detemir  8 Units Subcutaneous Daily  . metoprolol tartrate  100 mg Oral BID  . MUSCLE RUB  1 application Topical Daily  . pravastatin  20 mg Oral q1800  . predniSONE  10 mg Oral Daily  . simethicone  80 mg Oral QID   Continuous Infusions:   LOS: 7 days    Time spent: 40 minutes    Leighana Neyman, Geraldo Docker, MD [facility] Pager [phone]   If 7PM-7AM, please contact night-coverage [email or web address] Password [email or web address] 05/21/2016, 8:21 AM

## 2016-05-21 NOTE — Progress Notes (Signed)
Occupational Therapy Treatment Patient Details Name: Marisa [name] MRN: [id number] DOB: 01-12-1941 Today's Date: 05/21/2016    History of present illness Pt is a 76 y/o female admitted secondary to visual abnormalties and L sided weakness found to have a brainstem infarct. PMH including but not limited to dementia, DM, HTN, RA and NSTEMI in 2012.   OT comments  Pt reporting diplopia which improves with R eye occluded, but pt unable to tolerate any sort of occlusion. Pt is progressing in bed mobility, transfers and ambulation. She is anxious with OOB mobility, but participates well. Pt with c/o dizziness, but VSS stable throughout on 1.5L 02. Will continue to follow.  Follow Up Recommendations  SNF;Supervision/Assistance - 24 hour    Equipment Recommendations       Recommendations for Other Services      Precautions / Restrictions Precautions Precautions: Fall Precaution Comments: uses 02 at home Restrictions Weight Bearing Restrictions: No       Mobility Bed Mobility Overal bed mobility: Needs Assistance Bed Mobility: Rolling;Supine to Sit Rolling: Min assist   Supine to sit: Min assist Sit to supine: Min assist   General bed mobility comments: pt with use of rail and min assist to pivot to EOB with increased time  Transfers Overall transfer level: Needs assistance Equipment used: 2 person hand held assist Transfers: Sit to/from [facility] Sit to Stand: +2 physical assistance;Min assist Stand pivot transfers: +2 physical assistance;Mod assist       General transfer comment: + 2 Min assist to elevate from bed, moderate assist for stability during transfer to [facility]. VCs for hand placement upon elevation to upright from [facility]    Balance Overall balance assessment: Needs assistance Sitting-balance support: Bilateral upper extremity supported Sitting balance-Leahy Scale: Fair Sitting balance - Comments: able to sit EOB without assist    Standing  balance support: Bilateral upper extremity supported Standing balance-Leahy Scale: Poor                     ADL Overall ADL's : Needs assistance/impaired     Grooming: Brushing hair;Sitting;Total assistance               Lower Body Dressing: Total assistance;Bed level   Toilet Transfer: +2 for physical assistance;Stand-pivot;BSC;Moderate assistance   Toileting- Clothing Manipulation and Hygiene: Total assistance;+2 for physical assistance;Sit to/from stand       Functional mobility during ADLs: +2 for safety/equipment;Maximal assistance (under arms)        Vision                     Perception     Praxis      Cognition   Behavior During Therapy: Anxious Overall Cognitive Status: No family/caregiver present to determine baseline cognitive functioning                  General Comments: pt with dementia at baseline    Extremity/Trunk Assessment  Upper Extremity Assessment RUE Deficits / Details: severe arthritic deformities and limitations of all joints RUE Coordination: decreased fine motor;decreased gross motor LUE Deficits / Details: severe arthritic deformities with limitations in all joints LUE Coordination: decreased fine motor;decreased gross motor            Exercises     Shoulder Instructions       General Comments      Pertinent Vitals/ Pain       Pain Assessment: 0-10 Pain Score: 8  Pain Location: all over  Pain Descriptors / Indicators: Aching;Sore Pain Intervention(s): Monitored during session;Repositioned  Home Living Family/patient expects to be discharged to:: Unsure Living Arrangements: Spouse/significant other Available Help at Discharge: Family;Available 24 hours/day Type of Home: House                           Additional Comments: Pt lives at home with her husband who is her primary caregiver. Pt with dementia at baseline with no caregivers or family members present during evaluation.   Lives With: Family    Prior Functioning/Environment Level of Independence: Needs assistance  Gait / Transfers Assistance Needed: ambulating mod I with cane ADL's / Homemaking Assistance Needed: assisted for bathing, dressing and all IADL, reports being able to self feed       Frequency  Min 2X/week        Progress Toward Goals  OT Goals(current goals can now be found in the care plan section)  Progress towards OT goals: Progressing toward goals  Acute Rehab OT Goals Patient Stated Goal: to see her husband, Ray Time For Goal Achievement: 05/30/16 Potential to Achieve Goals: [place] Discharge plan remains appropriate    Co-evaluation    PT/OT/SLP Co-Evaluation/Treatment: Yes Reason for Co-Treatment: For patient/therapist safety PT goals addressed during session: Mobility/safety with mobility OT goals addressed during session: ADL's and self-care      End of Session Equipment Utilized During Treatment: Gait belt;Oxygen   Activity Tolerance Patient tolerated treatment well   Patient Left in chair;with call bell/phone within reach;with chair alarm set   Nurse Communication          Time: [id number] OT Time Calculation (min): 34 min  Charges: OT General Charges $OT Visit: 1 Procedure OT Treatments $Self Care/Home Management : 8-22 mins  Malka So 05/21/2016, 11:55 AM  [phone]

## 2016-05-22 LAB — GLUCOSE, CAPILLARY
GLUCOSE-CAPILLARY: 423 mg/dL — AB (ref 65–99)
GLUCOSE-CAPILLARY: 263 mg/dL — AB (ref 65–99)
GLUCOSE-CAPILLARY: 91 mg/dL (ref 65–99)
Glucose-Capillary: 226 mg/dL — ABNORMAL HIGH (ref 65–99)
Glucose-Capillary: 177 mg/dL — ABNORMAL HIGH (ref 65–99)

## 2016-05-22 MED ORDER — HYDRALAZINE HCL 20 MG/ML IJ SOLN
10.0000 mg | INTRAMUSCULAR | Status: DC | PRN
  Filled 2016-05-22: qty 1

## 2016-05-22 MED ORDER — INSULIN ASPART 100 UNIT/ML ~~LOC~~ SOLN: 6.0000 [IU] | Freq: Three times a day (TID) | SUBCUTANEOUS | Status: DC

## 2016-05-22 NOTE — Clinical Social Work Note (Signed)
[facility] does not have a bed available today. CSW provided patient's husband with list of other bed offers. He wants a private room so that he can stay with her and manage patient's diabetes during the night. CSW was told by [facility] admissions coordinator earlier today that they have private rooms available. [facility] is patient's husband's backup SNF if [facility] cannot accommodate her.  Dayton Scrape, [place]

## 2016-05-22 NOTE — Progress Notes (Signed)
[place] TEAM 1 - Stepdown/ICU TEAM  Marisa [name]  [id number] DOB: [birth year] DOA: 05/14/2016 PCP: Jani Gravel, MD    Brief Narrative:  76 y.o. female with history of dementia, severe rheumatoid arthritis on chronic steroids and Arava, stage III chronic kidney disease, diabetes, hypertension, COPD O2 dependent on 2 L, protein calorie malnutrition, chronic anemia and GERD who was last seen normal prior to going to bed. She then awakened the morning of her admit with blurry vision in the right eye and the inability to see out of the left eye. She noted left-sided weakness as well.   In the ED a CT angio of the head and neck was suggestive of a brainstem infarct.  She was also found to have a type B aortic dissection.  Subjective: The patient is sitting up in a bedside chair.  She is in good spirits.  She denies chest pain shortness breath fevers chills nausea or vomiting.  Her blood pressure appears to have stabilized.  Though her CBG has improved overall she did suffer a peak at greater than 400 this morning.  Assessment & Plan:  Uncontrolled DM2 CBG improving overall and patient appears to be very close to being stable for discharge but with CBG greater than 400 this morning I wish to monitor her the remainder of today/tonight to assure that she does not have rebound hypoglycemia due to treatment or persisting severe hyperglycemia  HTN  Blood pressure control significantly improved - follow without change in treatment plan today  Right paramedian midbrain, right occipital lobe, 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 -Patient presented with significant visual acuity changes associated with abnormal eye movement and left-sided weakness with diagnostic evidence of brainstem infarct -Neurology evaluated and suggests DAPT for 3 months and then plavix alone -B vertebral occlusions noted on CTa -awaiting D/C to SNF for rehab [facility])  Type B aortic dissection  -Incidental finding on CT angio of the neck -Vasc Surg has evaluated  -CTa chest notes "the area of concern on the proximal descending thoracic aorta is fairly localized and appears to be a localized dissection versus a penetrating ulcer" per VVS -to f/u w/ Dr. Scot Dock in 6 months for repeat CT -Focus on blood pressure control avoiding significant hypertension  Diarrhea  Unclear etiology but appears to be a chronic issue  - no suggestion of infectious etiology  - trial of Imodium as per home regimen has improved this significantly  CKD Stage 3  -baseline crt 1.3 - crt stable and better than baseline  Recent Labs Lab 05/16/16 0330 05/16/16 1502 05/17/16 1017 05/20/16 0146 05/21/16 0341  CREATININE 1.12* 1.10* 0.92 0.99 0.98   HLD -Cont pravastatin  Chronic respiratory failure with hypoxemia / COPD  -on home O2 at 2 L/m - currently stable on home dose of O2  Rheumatoid arthritis  -On prednisone 10 mg daily along with [place] steroid tx - hold Arava for now w/ resumption at d/c   GERD  Iron deficiency anemia due to chronic blood loss -Baseline hemoglobin appears to be between 7.7 and 8.7 - Hgb presently better than baseline and stable  Recent Labs Lab 05/16/16 0720 05/17/16 1017 05/20/16 0146  HGB 8.7* 9.3* 8.9*   Hypokalemia Corrected  DVT prophylaxis: SCDs Code Status: FULL CODE Family Communication: No family present at time of exam today  Disposition Plan:  will be ready for SNF as soon as CBG better controlled (?Thursday)  Consultants:  Neurology  VVS  Procedures: none  Antimicrobials:  none   Objective: Blood pressure (!) 136/51, pulse 65, temperature 98 F (36.7 C), temperature source Oral, resp. rate (!) 25, height 5' (1.524 m), weight 49.9 kg (110 lb), SpO2 100 %.  Intake/Output Summary (Last 24 hours) at 05/22/16 1419 Last data filed at 05/22/16 0800  Gross per 24 hour  Intake              420 ml    Output             1775 ml  Net            -1355 ml   Filed Weights   05/14/16 1109  Weight: 49.9 kg (110 lb)    Examination: General: No acute respiratory distress - alert  Lungs: Distant breath sounds in all fields with no wheezing Cardiovascular: Regular rate and rhythm  Abdomen: Nontender, nondistended, soft, bowel sounds positive, no rebound Extremities: No edema B lower extremities  CBC:  Recent Labs Lab 05/16/16 0720 05/17/16 1017 05/20/16 0146  WBC 15.6* 12.0* 8.4  HGB 8.7* 9.3* 8.9*  HCT 26.4* 28.5* 27.7*  MCV 87.7 88.2 87.1  PLT 222 184 [id number]   Basic Metabolic Panel:  Recent Labs Lab 05/16/16 0330 05/16/16 1502 05/17/16 1017 05/20/16 0146 05/21/16 0341  NA 140 137 136 136 134*  K 5.6* 3.5 3.4* 3.2* 4.4  CL 103 102 101 100* 95*  CO2 27 23 26 27  32  GLUCOSE 123* 142* 241* 62* 224*  BUN 23* 17 9 10 7   CREATININE 1.12* 1.10* 0.92 0.99 0.98  CALCIUM 7.9* 7.6* 7.5* 8.1* 8.3*   GFR: Estimated Creatinine Clearance: 35.6 mL/min (by C-G formula based on SCr of 0.98 mg/dL).  Liver Function Tests:  Recent Labs Lab 05/16/16 0330 05/17/16 1017  AST 55* 27  ALT 19 17  ALKPHOS 81 72  BILITOT 1.2 0.8  PROT 5.1* 4.8*  ALBUMIN 3.3* 2.9*    HbA1C: Hemoglobin A1C  Date/Time Value Ref Range Status  02/02/2016 6.8  Final   Hgb A1c MFr Bld  Date/Time Value Ref Range Status  05/15/2016 04:29 AM 7.1 (H) 4.8 - 5.6 % Final    Comment:    (NOTE)         Pre-diabetes: 5.7 - 6.4         Diabetes: >6.4         Glycemic control for adults with diabetes: <7.0   05/14/2016 11:11 AM 7.1 (H) 4.8 - 5.6 % Final    Comment:    (NOTE)         Pre-diabetes: 5.7 - 6.4         Diabetes: >6.4         Glycemic control for adults with diabetes: <7.0     CBG:  Recent Labs Lab 05/21/16 1714 05/21/16 2114 05/22/16 0129 05/22/16 0802 05/22/16 1211  GLUCAP 207* 227* 226* 423* 263*    Recent Results (from the past 240 hour(s))  MRSA PCR Screening     Status:  None   Collection Time: 05/15/16  1:18 AM  Result Value Ref Range Status   MRSA by PCR NEGATIVE NEGATIVE Final    Comment:        The GeneXpert MRSA Assay (FDA approved for NASAL specimens only), is one component of a comprehensive MRSA colonization surveillance program. It is not intended to diagnose MRSA infection nor to guide or monitor treatment for MRSA infections.   C difficile  quick scan w PCR reflex     Status: None   Collection Time: 05/16/16 10:01 AM  Result Value Ref Range Status   C Diff antigen NEGATIVE NEGATIVE Final   C Diff toxin NEGATIVE NEGATIVE Final   C Diff interpretation No C. difficile detected.  Final     Scheduled Meds: . amLODipine  10 mg Oral Daily  . aspirin  325 mg Oral Daily  . citalopram  10 mg Oral Daily  . clopidogrel  75 mg Oral Daily  . donepezil  10 mg Oral QHS  . insulin aspart  0-5 Units Subcutaneous QHS  . insulin aspart  0-9 Units Subcutaneous TID WC  . insulin aspart  5 Units Subcutaneous TID WC  . insulin detemir  5 Units Subcutaneous Daily  . lisinopril  2.5 mg Oral Daily  . metoprolol tartrate  100 mg Oral BID  . MUSCLE RUB  1 application Topical Daily  . pravastatin  20 mg Oral q1800  . predniSONE  10 mg Oral Daily  . simethicone  80 mg Oral QID      LOS: 8 days   Cherene Altes, MD [facility] Office  [phone] Pager - Text Page per Amion as per below:  On-Call/Text Page:      [email or web address]      password [email or web address]  If 7PM-7AM, please contact night-coverage [email or web address] Password [email or web address] 05/22/2016, 2:19 PM

## 2016-05-23 DIAGNOSIS — Z823 Family history of stroke: Secondary | ICD-10-CM | POA: Diagnosis not present

## 2016-05-23 DIAGNOSIS — F419 Anxiety disorder, unspecified: Secondary | ICD-10-CM | POA: Diagnosis not present

## 2016-05-23 DIAGNOSIS — I63213 Cerebral infarction due to unspecified occlusion or stenosis of bilateral vertebral arteries: Secondary | ICD-10-CM | POA: Diagnosis not present

## 2016-05-23 DIAGNOSIS — E119 Type 2 diabetes mellitus without complications: Secondary | ICD-10-CM | POA: Diagnosis not present

## 2016-05-23 DIAGNOSIS — E875 Hyperkalemia: Secondary | ICD-10-CM | POA: Diagnosis present

## 2016-05-23 DIAGNOSIS — I10 Essential (primary) hypertension: Secondary | ICD-10-CM | POA: Diagnosis not present

## 2016-05-23 DIAGNOSIS — Z7952 Long term (current) use of systemic steroids: Secondary | ICD-10-CM | POA: Diagnosis not present

## 2016-05-23 DIAGNOSIS — K219 Gastro-esophageal reflux disease without esophagitis: Secondary | ICD-10-CM | POA: Diagnosis not present

## 2016-05-23 DIAGNOSIS — E861 Hypovolemia: Secondary | ICD-10-CM | POA: Diagnosis present

## 2016-05-23 DIAGNOSIS — R269 Unspecified abnormalities of gait and mobility: Secondary | ICD-10-CM | POA: Diagnosis not present

## 2016-05-23 DIAGNOSIS — E118 Type 2 diabetes mellitus with unspecified complications: Secondary | ICD-10-CM

## 2016-05-23 DIAGNOSIS — I638 Other cerebral infarction: Secondary | ICD-10-CM | POA: Diagnosis not present

## 2016-05-23 DIAGNOSIS — Z888 Allergy status to other drugs, medicaments and biological substances status: Secondary | ICD-10-CM | POA: Diagnosis not present

## 2016-05-23 DIAGNOSIS — H532 Diplopia: Secondary | ICD-10-CM | POA: Diagnosis not present

## 2016-05-23 DIAGNOSIS — E11649 Type 2 diabetes mellitus with hypoglycemia without coma: Secondary | ICD-10-CM | POA: Diagnosis not present

## 2016-05-23 DIAGNOSIS — J9611 Chronic respiratory failure with hypoxia: Secondary | ICD-10-CM | POA: Diagnosis not present

## 2016-05-23 DIAGNOSIS — E871 Hypo-osmolality and hyponatremia: Secondary | ICD-10-CM | POA: Diagnosis not present

## 2016-05-23 DIAGNOSIS — D500 Iron deficiency anemia secondary to blood loss (chronic): Secondary | ICD-10-CM | POA: Diagnosis not present

## 2016-05-23 DIAGNOSIS — J431 Panlobular emphysema: Secondary | ICD-10-CM | POA: Diagnosis not present

## 2016-05-23 DIAGNOSIS — Z8673 Personal history of transient ischemic attack (TIA), and cerebral infarction without residual deficits: Secondary | ICD-10-CM | POA: Diagnosis not present

## 2016-05-23 DIAGNOSIS — N183 Chronic kidney disease, stage 3 (moderate): Secondary | ICD-10-CM | POA: Diagnosis not present

## 2016-05-23 DIAGNOSIS — Z885 Allergy status to narcotic agent status: Secondary | ICD-10-CM | POA: Diagnosis not present

## 2016-05-23 DIAGNOSIS — R278 Other lack of coordination: Secondary | ICD-10-CM | POA: Diagnosis not present

## 2016-05-23 DIAGNOSIS — Z79899 Other long term (current) drug therapy: Secondary | ICD-10-CM | POA: Diagnosis not present

## 2016-05-23 DIAGNOSIS — M0689 Other specified rheumatoid arthritis, multiple sites: Secondary | ICD-10-CM | POA: Diagnosis not present

## 2016-05-23 DIAGNOSIS — Z8249 Family history of ischemic heart disease and other diseases of the circulatory system: Secondary | ICD-10-CM | POA: Diagnosis not present

## 2016-05-23 DIAGNOSIS — E1129 Type 2 diabetes mellitus with other diabetic kidney complication: Secondary | ICD-10-CM | POA: Diagnosis not present

## 2016-05-23 DIAGNOSIS — I639 Cerebral infarction, unspecified: Principal | ICD-10-CM

## 2016-05-23 DIAGNOSIS — IMO0002 Reserved for concepts with insufficient information to code with codable children: Secondary | ICD-10-CM

## 2016-05-23 DIAGNOSIS — A419 Sepsis, unspecified organism: Secondary | ICD-10-CM | POA: Diagnosis not present

## 2016-05-23 DIAGNOSIS — M6281 Muscle weakness (generalized): Secondary | ICD-10-CM | POA: Diagnosis not present

## 2016-05-23 DIAGNOSIS — E2740 Unspecified adrenocortical insufficiency: Secondary | ICD-10-CM | POA: Diagnosis present

## 2016-05-23 DIAGNOSIS — E1165 Type 2 diabetes mellitus with hyperglycemia: Secondary | ICD-10-CM | POA: Diagnosis not present

## 2016-05-23 DIAGNOSIS — I252 Old myocardial infarction: Secondary | ICD-10-CM | POA: Diagnosis not present

## 2016-05-23 DIAGNOSIS — R6520 Severe sepsis without septic shock: Secondary | ICD-10-CM | POA: Diagnosis present

## 2016-05-23 DIAGNOSIS — Z7982 Long term (current) use of aspirin: Secondary | ICD-10-CM | POA: Diagnosis not present

## 2016-05-23 DIAGNOSIS — E860 Dehydration: Secondary | ICD-10-CM | POA: Diagnosis present

## 2016-05-23 DIAGNOSIS — F39 Unspecified mood [affective] disorder: Secondary | ICD-10-CM | POA: Diagnosis not present

## 2016-05-23 DIAGNOSIS — Z66 Do not resuscitate: Secondary | ICD-10-CM | POA: Diagnosis present

## 2016-05-23 DIAGNOSIS — I6789 Other cerebrovascular disease: Secondary | ICD-10-CM | POA: Diagnosis not present

## 2016-05-23 DIAGNOSIS — F333 Major depressive disorder, recurrent, severe with psychotic symptoms: Secondary | ICD-10-CM | POA: Diagnosis not present

## 2016-05-23 DIAGNOSIS — M069 Rheumatoid arthritis, unspecified: Secondary | ICD-10-CM | POA: Diagnosis not present

## 2016-05-23 DIAGNOSIS — J449 Chronic obstructive pulmonary disease, unspecified: Secondary | ICD-10-CM | POA: Diagnosis not present

## 2016-05-23 DIAGNOSIS — R4182 Altered mental status, unspecified: Secondary | ICD-10-CM | POA: Diagnosis not present

## 2016-05-23 DIAGNOSIS — N179 Acute kidney failure, unspecified: Secondary | ICD-10-CM | POA: Diagnosis not present

## 2016-05-23 DIAGNOSIS — F0150 Vascular dementia without behavioral disturbance: Secondary | ICD-10-CM | POA: Diagnosis not present

## 2016-05-23 DIAGNOSIS — E46 Unspecified protein-calorie malnutrition: Secondary | ICD-10-CM | POA: Diagnosis not present

## 2016-05-23 DIAGNOSIS — I7100 Dissection of unspecified site of aorta: Secondary | ICD-10-CM | POA: Diagnosis not present

## 2016-05-23 DIAGNOSIS — R404 Transient alteration of awareness: Secondary | ICD-10-CM | POA: Diagnosis not present

## 2016-05-23 DIAGNOSIS — Z880 Allergy status to penicillin: Secondary | ICD-10-CM | POA: Diagnosis not present

## 2016-05-23 DIAGNOSIS — Z794 Long term (current) use of insulin: Secondary | ICD-10-CM | POA: Diagnosis not present

## 2016-05-23 DIAGNOSIS — I613 Nontraumatic intracerebral hemorrhage in brain stem: Secondary | ICD-10-CM | POA: Diagnosis not present

## 2016-05-23 DIAGNOSIS — Z90710 Acquired absence of both cervix and uterus: Secondary | ICD-10-CM | POA: Diagnosis not present

## 2016-05-23 DIAGNOSIS — I7770 Dissection of unspecified artery: Secondary | ICD-10-CM | POA: Diagnosis not present

## 2016-05-23 DIAGNOSIS — R531 Weakness: Secondary | ICD-10-CM | POA: Diagnosis not present

## 2016-05-23 DIAGNOSIS — R509 Fever, unspecified: Secondary | ICD-10-CM | POA: Diagnosis not present

## 2016-05-23 DIAGNOSIS — R410 Disorientation, unspecified: Secondary | ICD-10-CM | POA: Diagnosis not present

## 2016-05-23 LAB — GLUCOSE, CAPILLARY
GLUCOSE-CAPILLARY: 42 mg/dL — AB (ref 65–99)
Glucose-Capillary: 238 mg/dL — ABNORMAL HIGH (ref 65–99)
Glucose-Capillary: 378 mg/dL — ABNORMAL HIGH (ref 65–99)
Glucose-Capillary: 95 mg/dL (ref 65–99)

## 2016-05-23 MED ORDER — METOPROLOL TARTRATE 100 MG PO TABS: 100.0000 mg | ORAL_TABLET | Freq: Two times a day (BID) | ORAL | 0 refills | Status: AC

## 2016-05-23 MED ORDER — DONEPEZIL HCL 10 MG PO TABS: 10.0000 mg | ORAL_TABLET | Freq: Every day | ORAL | 0 refills | Status: AC

## 2016-05-23 MED ORDER — POLYVINYL ALCOHOL 1.4 % OP SOLN: 1.0000 [drp] | Freq: Every day | OPHTHALMIC | 0 refills | Status: DC | PRN

## 2016-05-23 MED ORDER — AMLODIPINE BESYLATE 10 MG PO TABS: 10.0000 mg | ORAL_TABLET | Freq: Every day | ORAL | 0 refills | Status: AC

## 2016-05-23 MED ORDER — LOPERAMIDE HCL 2 MG PO CAPS: 2.0000 mg | ORAL_CAPSULE | ORAL | 0 refills | Status: DC | PRN

## 2016-05-23 MED ORDER — INSULIN ASPART 100 UNIT/ML ~~LOC~~ SOLN: 8.0000 [IU] | Freq: Three times a day (TID) | SUBCUTANEOUS | 0 refills | Status: DC

## 2016-05-23 MED ORDER — INSULIN ASPART 100 UNIT/ML ~~LOC~~ SOLN
8.0000 [IU] | Freq: Three times a day (TID) | SUBCUTANEOUS | Status: DC
  Administered 2016-05-23: 8 [IU] via SUBCUTANEOUS

## 2016-05-23 MED ORDER — CITALOPRAM HYDROBROMIDE 10 MG PO TABS: 10.0000 mg | ORAL_TABLET | Freq: Every day | ORAL | 0 refills | Status: AC

## 2016-05-23 MED ORDER — ESTROGENS CONJUGATED 0.625 MG PO TABS: 0.6250 mg | ORAL_TABLET | Freq: Every day | ORAL | 0 refills | Status: AC

## 2016-05-23 MED ORDER — PREDNISONE 10 MG PO TABS: 10.0000 mg | ORAL_TABLET | Freq: Every day | ORAL | 0 refills | Status: AC

## 2016-05-23 MED ORDER — FLUTICASONE PROPIONATE 50 MCG/ACT NA SUSP: 2.0000 | Freq: Every day | NASAL | 0 refills | Status: AC | PRN

## 2016-05-23 MED ORDER — ASPIRIN 325 MG PO TABS: 325.0000 mg | ORAL_TABLET | Freq: Every day | ORAL | 0 refills | Status: DC

## 2016-05-23 MED ORDER — LORAZEPAM 0.5 MG PO TABS: 0.5000 mg | ORAL_TABLET | ORAL | 0 refills | Status: DC | PRN

## 2016-05-23 MED ORDER — INSULIN DETEMIR 100 UNIT/ML ~~LOC~~ SOLN: 6.0000 [IU] | Freq: Every day | SUBCUTANEOUS | 0 refills | Status: DC

## 2016-05-23 MED ORDER — CLOPIDOGREL BISULFATE 75 MG PO TABS: 75.0000 mg | ORAL_TABLET | Freq: Every day | ORAL | 0 refills | Status: AC

## 2016-05-23 MED ORDER — LEFLUNOMIDE 20 MG PO TABS: 20.0000 mg | ORAL_TABLET | Freq: Every day | ORAL | 0 refills | Status: AC

## 2016-05-23 MED ORDER — IPRATROPIUM-ALBUTEROL 0.5-2.5 (3) MG/3ML IN SOLN: 3.0000 mL | Freq: Four times a day (QID) | RESPIRATORY_TRACT | 0 refills | Status: DC | PRN

## 2016-05-23 MED ORDER — SIMETHICONE 40 MG/0.6ML PO SUSP: 80.0000 mg | Freq: Four times a day (QID) | ORAL | 0 refills | Status: AC

## 2016-05-23 MED ORDER — INSULIN DETEMIR 100 UNIT/ML ~~LOC~~ SOLN
6.0000 [IU] | Freq: Every day | SUBCUTANEOUS | Status: DC
  Administered 2016-05-23: 6 [IU] via SUBCUTANEOUS
  Filled 2016-05-23: qty 0.06

## 2016-05-23 MED ORDER — PRAVASTATIN SODIUM 20 MG PO TABS: 20.0000 mg | ORAL_TABLET | Freq: Every day | ORAL | 0 refills | Status: DC

## 2016-05-23 MED ORDER — MUSCLE RUB 10-15 % EX CREA: 1.0000 "application " | TOPICAL_CREAM | Freq: Every day | CUTANEOUS | 0 refills | Status: DC

## 2016-05-23 MED ORDER — LEVALBUTEROL TARTRATE 45 MCG/ACT IN AERO: 2.0000 | INHALATION_SPRAY | RESPIRATORY_TRACT | 0 refills | Status: DC | PRN

## 2016-05-23 MED ORDER — LISINOPRIL 5 MG PO TABS: 5.0000 mg | ORAL_TABLET | Freq: Every day | ORAL | Status: DC

## 2016-05-23 MED ORDER — DOCUSATE SODIUM 100 MG PO CAPS: 100.0000 mg | ORAL_CAPSULE | Freq: Every day | ORAL | 0 refills | Status: AC | PRN

## 2016-05-23 MED ORDER — LISINOPRIL 5 MG PO TABS: 5.0000 mg | ORAL_TABLET | Freq: Every day | ORAL | 0 refills | Status: AC

## 2016-05-23 MED ORDER — DICYCLOMINE HCL 10 MG/5ML PO SOLN: 10.0000 mg | Freq: Two times a day (BID) | ORAL | 0 refills | Status: AC

## 2016-05-23 NOTE — Progress Notes (Signed)
Pt d/c to [facility], provided [facility] with d/c paperwork. PIV removed. VSS. Report is called to [place], Therapist, sports.

## 2016-05-23 NOTE — Clinical Social Work Note (Signed)
CSW facilitated patient discharge including contacting patient family and facility to confirm patient discharge plans. Clinical information faxed to facility and family agreeable with plan. CSW arranged ambulance transport via [facility] to [place] around 5:15 pm. RN to call report prior to discharge [phone] on the [phone] hall).  CSW will sign off for now as social work intervention is no longer needed. Please consult [place] again if new needs arise.  Dayton Scrape, [place]

## 2016-05-23 NOTE — Discharge Summary (Signed)
Physician Discharge Summary  Marisa [name] [id number] DOB: [birth year] DOA: 05/14/2016  PCP: Jani Gravel, MD  Admit date: 05/14/2016 Discharge date: 05/23/2016  Time spent: 35 minutes  Recommendations for Outpatient Follow-up:  Uncontrolled DM Type 2 with complications - Per chart the patient's husband has stated  typical occurrence for her and that her CBGs have always been quite labile - Will allow to run on the high side. Would make further small adjustments as outpatient -Decrease Levemir 6 units daily -Increase NovoLog 8 units QAC   HTN  -Amlodipine 10 mg -Lisinopril 5 mg daily -Metoprolol 100 mg BID  Right paramedian midbrain, right occipital lobe, 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 -Continued residual diplopia and left-sided weakness  -Neurology evaluatedand suggests DAPT for 3 months and then plavix alone -B vertebral occlusions noted on CTA -Schedule Follow up with Dr. Delfin Edis in 4-6 weeks CVA  Type B aortic dissection  -Incidental finding on CT angio of the neck -Vasc Surg has evaluated  -CTa chest notes "the area of concern on the proximal descending thoracic aorta is fairly localized and appears to be a localized dissection versus a penetrating ulcer" per [facility], scheduled follow-up  f/u w/ Dr. Deitra Mayo in 6 months for repeat CT -Titrate BP medication up slowly, the lower patient's BP keeping in mind her tendency for falling.  HTN  -See type B aortic dissection  Diarrhea  -Unclear etiology but appears to be a chronic issue - no suggestion of infectious etiology  -Improved with Imodium  CKD Stage 3(baseline crt 1.3)  Lab Results  Component Value Date   CREATININE 0.98 05/21/2016   CREATININE 0.99 05/20/2016   CREATININE 0.92 05/17/2016  -better than baseline.  Rheumatoid arthritis -Restart home meds     Discharge Diagnoses:  Principal Problem:   Brainstem infarct, acute  ([place]) Active Problems:   HTN (hypertension)   COPD (chronic obstructive pulmonary disease) (HCC)   GERD   Protein calorie malnutrition (HCC)   Chronic respiratory failure (HCC)   Iron deficiency anemia due to chronic blood loss   Rheumatoid arthritis (HCC)   Insulin dependent type 2 diabetes mellitus, uncontrolled (HCC)   Chronic renal insufficiency, stage 3 (moderate)   Aortic dissection (HCC)   Stroke (HCC)   Diplopia   CKD (chronic kidney disease), stage III   Chronic respiratory failure with hypoxia (HCC)   Panlobular emphysema ([place])   Discharge Condition: Stable  Diet recommendation: American diabetic Association  Filed Weights   05/14/16 1109 05/23/16 0457  Weight: 49.9 kg (110 lb) 45.4 kg (100 lb)    History of present illness:  76 y.o.WF PMHx Dementia, Severe RA, on Chronic steroids and Arava, CKD stage III , DM type II uncontrolled with complications, HTN, COPD (O2 dependent on 2 L), protein calorie malnutrition,chronic anemia and GERD   Who was last seen normal prior to going to bed. She then awakened the morning of her admit with blurry vision in the right eye and the inability to see out of the left eye. She noted left-sided weakness as well.   In the ED a CT angio of the head and neck was suggestive of a brainstem infarct. She was also found to have a type B aortic dissection. During this hospitalization patient was diagnosed with brainstem infarction (new), as well as a type B aortic dissection. Patient was evaluated by surgery and will require follow-up with Dr. Scot Dock in 6 months for a repeat CT scan.  In regards to patient's brainstem infarct patient has residual left-sided weakness with diplopia and will require extensive rehabilitation.    Procedures:  1/30 CT head with contrast: Brain:- No acute intracranial hemorrhage/infarct. No focal mass lesion. -Remote infarction in the LEFT cerebellum .- Deep white matter infarction in the RIGHT frontal lobe also  unchanged. 1/30CT Angiography head/neck:Partially visualized type B aortic dissection. - No major intracranial arterial occlusion. Mild intracranial right ICA stenosis. -Partial occlusion of the right V3 segment with distal reconstitution. - Severe osseous compression of the left vertebral artery at the C3 level versus short segment occlusion secondary to chronic malalignment and osseous deformities, - Mild narrowing of the left V3 segment compatible with short segment dissection, also potentially related to prior trauma although an acute dissection cannot be entirely excluded given the lack of prior angiographic studies for comparison. 1/30 MRI brain/MRA:-Small acute/early subacute infarcts within the right paramedian midbrain, right occipital lobe, and bilateral cerebellar hemispheres. 2/1 CT Angiogram chest/abdomen/pelvis:Focal contain dissection in the proximal descending thoracic aorta. -Early cirrhotic change in the liver. -Multiple right-sided rib fractures Some are healing.Right sixth rib fracture has a subacute appearance. -L1 compression fracture of indeterminate age.    Consultations: Neurology  [facility]     Discharge Exam: Vitals:   05/22/16 2348 05/23/16 0457 05/23/16 0724 05/23/16 1103  BP: (!) 174/72 (!) 179/64 (!) 156/75 138/60  Pulse: 72 76 94 65  Resp: 18 (!) 25 20 (!) 22  Temp: 98.2 F (36.8 C) 97.6 F (36.4 C) 97.6 F (36.4 C) 97.5 F (36.4 C)  TempSrc: Oral Oral Oral Oral  SpO2: 100% 95% 100% 100%  Weight:  45.4 kg (100 lb)    Height:        General:A/O 3 (does not know when). Patient confused on current issues, No acute respiratory distress Eyes: negative scleral hemorrhage, negative anisocoria, negative icterus ENT: Negative Runny nose, negative gingival bleeding, Neck:  Negative scars, masses, torticollis, lymphadenopathy, JVD Lungs: Clear to auscultation bilaterally without wheezes or crackles Cardiovascular: Regular rate and rhythm without murmur  gallop or rub normal S1 and S2    Discharge Instructions  Discharge Instructions    Ambulatory referral to Neurology    Complete by:  As directed    [facility] up [facility]nee [facility] in 2 months. Thanks     Allergies as of 05/23/2016      Reactions   Demerol [meperidine] Other (See Comments)   Hallucinations   Penicillins Swelling, Other (See Comments)   Throat swelling Has patient had a PCN reaction causing immediate rash, facial/tongue/throat swelling, SOB or lightheadedness with hypotension: Yes Has patient had a PCN reaction causing severe rash involving mucus membranes or skin necrosis: No Has patient had a PCN reaction that required hospitalization No Has patient had a PCN reaction occurring within the last 10 years: No If all of the above answers are "NO", then may proceed with Cephalosporin use.   Enbrel [etanercept] Swelling   Arm swelling   Humira [adalimumab] Swelling   Arm swelling      Medication List    STOP taking these medications   AMBULATORY NON FORMULARY MEDICATION   BIOFREEZE 4 % Gel Generic drug:  Menthol (Topical Analgesic) Replaced by:  MUSCLE RUB 10-15 % Crea   HUMALOG 100 UNIT/ML injection Generic drug:  insulin lispro   metoprolol succinate 100 MG 24 hr tablet Commonly known as:  TOPROL-XL   pregabalin 75 MG capsule Commonly known as:  LYRICA   promethazine 25 MG tablet  Commonly known as:  PHENERGAN   ranitidine 150 MG tablet Commonly known as:  ZANTAC   risperiDONE 0.5 MG tablet Commonly known as:  RISPERDAL   SYSTANE OP Replaced by:  polyvinyl alcohol 1.4 % ophthalmic solution   traMADol 50 MG tablet Commonly known as:  ULTRAM   VITAMIN D PO     TAKE these medications   amLODipine 10 MG tablet Commonly known as:  NORVASC Take 1 tablet (10 mg total) by mouth daily. Start taking on:  05/24/2016   aspirin 325 MG tablet Take 1 tablet (325 mg total) by mouth daily. Start taking on:  05/24/2016   citalopram 10 MG  tablet Commonly known as:  CELEXA Take 1 tablet (10 mg total) by mouth daily. Start taking on:  05/24/2016   clopidogrel 75 MG tablet Commonly known as:  PLAVIX Take 1 tablet (75 mg total) by mouth daily. Start taking on:  05/24/2016   Co Q 10 100 MG Caps Take 100 mg by mouth daily.   dicyclomine 10 MG/5ML syrup Commonly known as:  BENTYL Take 5 mLs (10 mg total) by mouth 2 (two) times daily.   diphenhydrAMINE 25 MG tablet Commonly known as:  BENADRYL Take 25 mg by mouth every 8 (eight) hours as needed for allergies.   docusate sodium 100 MG capsule Commonly known as:  COLACE Take 1 capsule (100 mg total) by mouth daily as needed for mild constipation. What changed:  when to take this  reasons to take this   donepezil 10 MG tablet Commonly known as:  ARICEPT Take 1 tablet (10 mg total) by mouth at bedtime.   estrogens (conjugated) 0.625 MG tablet Commonly known as:  PREMARIN Take 1 tablet (0.625 mg total) by mouth daily.   ESTROVEN PO Take 1 capsule by mouth daily.   fluticasone 50 MCG/ACT nasal spray Commonly known as:  FLONASE Place 2 sprays into both nostrils daily as needed for allergies or rhinitis.   GLUCOSAMINE & FISH OIL PO Take 1,000 mg by mouth 2 (two) times daily.   insulin aspart 100 UNIT/ML injection Commonly known as:  novoLOG Inject 8 Units into the skin 3 (three) times daily with meals. What changed:  how much to take  how to take this  when to take this  additional instructions   insulin detemir 100 UNIT/ML injection Commonly known as:  LEVEMIR Inject 0.06 mLs (6 Units total) into the skin daily. Start taking on:  05/24/2016 What changed:  how much to take  when to take this  additional instructions   INTEGRA PO Take 1 capsule by mouth 2 (two) times daily. Supplement   ipratropium-albuterol 0.5-2.5 (3) MG/3ML Soln Commonly known as:  DUONEB Take 3 mLs by nebulization every 6 (six) hours as needed.   leflunomide 20 MG  tablet Commonly known as:  ARAVA Take 1 tablet (20 mg total) by mouth daily.   levalbuterol 45 MCG/ACT inhaler Commonly known as:  XOPENEX HFA Inhale 2 puffs into the lungs every 4 (four) hours as needed for wheezing (wheezing).   lisinopril 5 MG tablet Commonly known as:  PRINIVIL,ZESTRIL Take 1 tablet (5 mg total) by mouth daily. Start taking on:  05/24/2016   loperamide 2 MG capsule Commonly known as:  IMODIUM Take 1 capsule (2 mg total) by mouth as needed for diarrhea or loose stools.   LORazepam 0.5 MG tablet Commonly known as:  ATIVAN Take 1 tablet (0.5 mg total) by mouth every 4 (four) hours as needed for anxiety.  What changed:  medication strength   metoprolol 100 MG tablet Commonly known as:  LOPRESSOR Take 1 tablet (100 mg total) by mouth 2 (two) times daily.   MULTIVITAMINS PO Take 1 tablet by mouth. Daily.   MUSCLE RUB 10-15 % Crea Apply 1 application topically daily. Start taking on:  05/24/2016 Replaces:  BIOFREEZE 4 % Gel   polyvinyl alcohol 1.4 % ophthalmic solution Commonly known as:  LIQUIFILM TEARS Place 1 drop into both eyes daily as needed (dry eyes). Replaces:  SYSTANE OP   pravastatin 20 MG tablet Commonly known as:  PRAVACHOL Take 1 tablet (20 mg total) by mouth daily at 6 PM.   predniSONE 10 MG tablet Commonly known as:  DELTASONE Take 1 tablet (10 mg total) by mouth daily. Start taking on:  05/24/2016 What changed:  medication strength  additional instructions   shark liver oil-cocoa butter 0.25-3-85.5 % suppository Commonly known as:  PREPARATION H Place 1 suppository rectally 2 (two) times daily. Take 1 suppository BID x1 week, ending 02/17/16, then decrease to once qd for hemorrhoids   simethicone 40 MG/0.6ML drops Commonly known as:  MYLICON Take 1.2 mLs (80 mg total) by mouth 4 (four) times daily.   vitamin C 500 MG tablet Commonly known as:  ASCORBIC ACID Take 500 mg by mouth 2 (two) times daily.   VITAMIN K (PHYTONADIONE)  PO Take 100 mcg by mouth daily. Supplement   XIIDRA 5 % Soln Generic drug:  Lifitegrast Place 1 drop into both eyes 2 (two) times daily as needed (dry eyes).      Allergies  Allergen Reactions  . Demerol [Meperidine] Other (See Comments)    Hallucinations  . Penicillins Swelling and Other (See Comments)    Throat swelling Has patient had a PCN reaction causing immediate rash, facial/tongue/throat swelling, SOB or lightheadedness with hypotension: Yes Has patient had a PCN reaction causing severe rash involving mucus membranes or skin necrosis: No Has patient had a PCN reaction that required hospitalization No Has patient had a PCN reaction occurring within the last 10 years: No If all of the above answers are "NO", then may proceed with Cephalosporin use.   . Enbrel [Etanercept] Swelling    Arm swelling   . Humira [Adalimumab] Swelling    Arm swelling     Contact information for follow-up providers    Melvenia Beam, MD. Schedule an appointment as soon as possible for a visit in 6 week(s).   Specialty:  Neurology Contact information: [address] [place] [address] [phone]        Deitra Mayo, MD. Schedule an appointment as soon as possible for a visit in 6 month(s).   Specialties:  Vascular Surgery, Cardiology Why:  scheduled follow-up  f/u w/ Dr. Deitra Mayo in 6 months for repeat CT  Contact information: [place] [address] [phone]            Contact information for after-discharge care    Destination    [facility] Follow up.   Specialty:  Skilled Nursing Facility Contact information: [place] [facility] [place] [phone]                   The results of significant diagnostics from this hospitalization (including imaging, microbiology, ancillary and laboratory) are listed below for reference.    Significant Diagnostic Studies: Ct Angio Head W Or Wo  Contrast  Result Date: 05/14/2016 CLINICAL DATA:  Left-sided weakness.  Left  sixth nerve palsy. EXAM: CT ANGIOGRAPHY HEAD AND NECK TECHNIQUE: Multidetector CT imaging of the head and neck was performed using the standard protocol during bolus administration of intravenous contrast. Multiplanar CT image reconstructions and MIPs were obtained to evaluate the vascular anatomy. Carotid stenosis measurements (when applicable) are obtained utilizing NASCET criteria, using the distal internal carotid diameter as the denominator. CONTRAST:  50 mL Isovue 370 COMPARISON:  Noncontrast head CT earlier today and MRI 05/14/2015. Cervical spine MRI 08/19/2015. No prior angiographic imaging. FINDINGS: CTA NECK FINDINGS Aortic arch: 3 vessel aortic arch with moderate atherosclerotic plaque. Dilated distal aortic arch measuring 3.4 cm in diameter. Partially visualized aortic dissection in the proximal descending aorta. Eccentric calcified plaque in the proximal left subclavian artery results in less than 50% narrowing. Right carotid system: Medialized course of the common and proximal internal carotid arteries. Mild atherosclerotic plaque at the carotid bifurcation without stenosis. No evidence of dissection. Left carotid system: Medialized course of the common and proximal internal carotid arteries. No stenosis or evidence of dissection. Vertebral arteries: The right vertebral artery is patent proximally with mild-to-moderate proximal V1 stenosis. Additional mild atherosclerotic narrowing is present in the right V2 segment, and the right vertebral artery occludes at the C2 level with distal reconstitution of the distal V3 and V4 segments. The left vertebral artery is either markedly compressed (and not visualize) or occluded over a short segment at the level of the C3 transverse foramen. The left vertebral artery is patent proximal and distal to this, however there is a caliber change with mild smooth narrowing at the C1-2 level  followed by a return to a normal caliber more distally in the the V3 segment. Skeleton: Abnormal cervical alignment as demonstrated on prior MRI. There is 8 mm anterior subluxation of C3 on C4 with appearance suggesting perched, fused facets on the left. There is C4 and C5 vertebral body height loss, and there is fusion between the vertebral bodies from C3-C6 which may reflect posttraumatic degenerative change. There is prominent focal kyphosis centered at C4, and there is slight anterolisthesis of C2 on C3. Mild erosive changes are noted involving the dens. Other neck: No neck mass or lymph node enlargement. Upper chest: Minimal pleural-parenchymal scarring in the lung apices. Review of the MIP images confirms the above findings CTA HEAD FINDINGS Anterior circulation: The internal carotid arteries are patent from skullbase to carotid termini with bilateral siphon atherosclerosis resulting in mild to moderate luminal irregularity. There is mild right ICA stenosis near the petrous - cavernous junction. An infundibulum is noted at the left posterior communicating origin. Anterior communicating artery is unremarkable. ACAs and MCAs are patent without evidence of major branch occlusion or significant proximal stenosis. No aneurysm. Posterior circulation: Intracranial vertebral arteries are patent without focal stenosis. Left PICA, bilateral AICA, and bilateral SCA origins are patent. Basilar artery is widely patent. There are small posterior communicating arteries bilaterally. The PCAs are patent without evidence of significant stenosis. No aneurysm. Venous sinuses: Patent. Anatomic variants: None. Delayed phase: No abnormal enhancement. Review of the MIP images confirms the above findings IMPRESSION: 1. Partially visualized type B aortic dissection. 2. No major intracranial arterial occlusion. Mild intracranial right ICA stenosis. 3. Partial occlusion of the right V3 segment with distal reconstitution. 4. Severe  osseous compression of the left vertebral artery at the C3 level versus short segment occlusion secondary to chronic malalignment and osseous deformities, most likely related to remote trauma. 5. Mild narrowing of the left V3 segment compatible with short  segment dissection, also potentially related to prior trauma although an acute dissection cannot be entirely excluded given the lack of prior angiographic studies for comparison. 6. No cervical carotid artery stenosis. These results were called by telephone at the time of interpretation on 05/14/2016 at 1:56 pm to Dr. Merrily Pew , who verbally acknowledged these results. Electronically Signed   By: Logan Bores M.D.   On: 05/14/2016 14:00   Ct Head Wo Contrast  Result Date: 05/14/2016 CLINICAL DATA:  Pt brought in by EMS due to having blurry vision in right eye, abnormal gait, and leaning to the left EXAM: CT HEAD WITHOUT CONTRAST TECHNIQUE: Contiguous axial images were obtained from the base of the skull through the vertex without intravenous contrast. COMPARISON:  None MRI 05/14/2015 FINDINGS: Brain: No acute intracranial hemorrhage. No focal mass lesion. No CT evidence of acute infarction. No midline shift or mass effect. No hydrocephalus. Basilar cisterns are patent. Remote infarction in the LEFT cerebellum (image 9, series 3). Deep white matter infarction in the RIGHT frontal lobe also unchanged. Periventricular and subcortical white matter hypodensities. Generalized cortical atrophy. Vascular: Rim Skull: Normal. Negative for fracture or focal lesion. Sinuses/Orbits: Paranasal sinuses and mastoid air cells are clear. Orbits are clear. Other: None. IMPRESSION: 1. No acute intracranial findings. 2. Chronic atrophy and white matter microvascular disease. 3. Remote cerebellar infarct. Electronically Signed   By: Suzy Bouchard M.D.   On: 05/14/2016 11:44   Ct Angio Neck W And/or Wo Contrast  Result Date: 05/14/2016 CLINICAL DATA:  Left-sided weakness.   Left sixth nerve palsy. EXAM: CT ANGIOGRAPHY HEAD AND NECK TECHNIQUE: Multidetector CT imaging of the head and neck was performed using the standard protocol during bolus administration of intravenous contrast. Multiplanar CT image reconstructions and MIPs were obtained to evaluate the vascular anatomy. Carotid stenosis measurements (when applicable) are obtained utilizing NASCET criteria, using the distal internal carotid diameter as the denominator. CONTRAST:  50 mL Isovue 370 COMPARISON:  Noncontrast head CT earlier today and MRI 05/14/2015. Cervical spine MRI 08/19/2015. No prior angiographic imaging. FINDINGS: CTA NECK FINDINGS Aortic arch: 3 vessel aortic arch with moderate atherosclerotic plaque. Dilated distal aortic arch measuring 3.4 cm in diameter. Partially visualized aortic dissection in the proximal descending aorta. Eccentric calcified plaque in the proximal left subclavian artery results in less than 50% narrowing. Right carotid system: Medialized course of the common and proximal internal carotid arteries. Mild atherosclerotic plaque at the carotid bifurcation without stenosis. No evidence of dissection. Left carotid system: Medialized course of the common and proximal internal carotid arteries. No stenosis or evidence of dissection. Vertebral arteries: The right vertebral artery is patent proximally with mild-to-moderate proximal V1 stenosis. Additional mild atherosclerotic narrowing is present in the right V2 segment, and the right vertebral artery occludes at the C2 level with distal reconstitution of the distal V3 and V4 segments. The left vertebral artery is either markedly compressed (and not visualize) or occluded over a short segment at the level of the C3 transverse foramen. The left vertebral artery is patent proximal and distal to this, however there is a caliber change with mild smooth narrowing at the C1-2 level followed by a return to a normal caliber more distally in the the V3  segment. Skeleton: Abnormal cervical alignment as demonstrated on prior MRI. There is 8 mm anterior subluxation of C3 on C4 with appearance suggesting perched, fused facets on the left. There is C4 and C5 vertebral body height loss, and there is fusion between the vertebral  bodies from C3-C6 which may reflect posttraumatic degenerative change. There is prominent focal kyphosis centered at C4, and there is slight anterolisthesis of C2 on C3. Mild erosive changes are noted involving the dens. Other neck: No neck mass or lymph node enlargement. Upper chest: Minimal pleural-parenchymal scarring in the lung apices. Review of the MIP images confirms the above findings CTA HEAD FINDINGS Anterior circulation: The internal carotid arteries are patent from skullbase to carotid termini with bilateral siphon atherosclerosis resulting in mild to moderate luminal irregularity. There is mild right ICA stenosis near the petrous - cavernous junction. An infundibulum is noted at the left posterior communicating origin. Anterior communicating artery is unremarkable. ACAs and MCAs are patent without evidence of major branch occlusion or significant proximal stenosis. No aneurysm. Posterior circulation: Intracranial vertebral arteries are patent without focal stenosis. Left PICA, bilateral AICA, and bilateral SCA origins are patent. Basilar artery is widely patent. There are small posterior communicating arteries bilaterally. The PCAs are patent without evidence of significant stenosis. No aneurysm. Venous sinuses: Patent. Anatomic variants: None. Delayed phase: No abnormal enhancement. Review of the MIP images confirms the above findings IMPRESSION: 1. Partially visualized type B aortic dissection. 2. No major intracranial arterial occlusion. Mild intracranial right ICA stenosis. 3. Partial occlusion of the right V3 segment with distal reconstitution. 4. Severe osseous compression of the left vertebral artery at the C3 level versus  short segment occlusion secondary to chronic malalignment and osseous deformities, most likely related to remote trauma. 5. Mild narrowing of the left V3 segment compatible with short segment dissection, also potentially related to prior trauma although an acute dissection cannot be entirely excluded given the lack of prior angiographic studies for comparison. 6. No cervical carotid artery stenosis. These results were called by telephone at the time of interpretation on 05/14/2016 at 1:56 pm to Dr. Merrily Pew , who verbally acknowledged these results. Electronically Signed   By: Logan Bores M.D.   On: 05/14/2016 14:00   Mr Virgel Paling [id number] Contrast  Result Date: 05/14/2016 CLINICAL DATA:  Blurry vision in right eye and left-sided weakness. EXAM: MRI HEAD WITHOUT CONTRAST MRA HEAD WITHOUT CONTRAST TECHNIQUE: Axial and coronal diffusion weighted sequences and axial time-of-flight MRA of the brain was performed. COMPARISON:  05/14/2016 CT angiogram head and neck. FINDINGS: MRI HEAD FINDINGS Small foci of diffusion restriction within the right occipital lobe, right paramedian midbrain, and scattered in bilateral cerebellar hemispheres compatible with acute/early subacute infarction. MRA HEAD FINDINGS Severe motion artifact. Suboptimal evaluation for stenosis or aneurysm. Bilateral vertebral arteries and the basilar artery are patent. Bilateral internal carotid arteries, bilateral middle cerebral arteries, and bilateral anterior cerebral arteries are patent proximally. IMPRESSION: 1. 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 2. Severe motion artifact of time-of-flight MRA. No new proximal large vessel occlusion identified. These results will be called to the ordering clinician or representative by the Radiologist Assistant, and communication documented in the PACS or zVision Dashboard. Electronically Signed   By: Kristine Garbe M.D.   On: 05/14/2016 17:57   Mr Brain Limited Wo Contrast  Result Date: 05/14/2016 CLINICAL DATA:  Blurry vision in right eye and left-sided weakness. EXAM: MRI HEAD WITHOUT CONTRAST MRA HEAD WITHOUT CONTRAST TECHNIQUE: Axial and coronal diffusion weighted sequences and axial time-of-flight MRA of the brain was performed. COMPARISON:  05/14/2016 CT angiogram head and neck. FINDINGS: MRI HEAD FINDINGS Small foci of diffusion restriction within the right occipital lobe, right paramedian  midbrain, and scattered in bilateral cerebellar hemispheres compatible with acute/early subacute infarction. MRA HEAD FINDINGS Severe motion artifact. Suboptimal evaluation for stenosis or aneurysm. Bilateral vertebral arteries and the basilar artery are patent. Bilateral internal carotid arteries, bilateral middle cerebral arteries, and bilateral anterior cerebral arteries are patent proximally. IMPRESSION: 1. 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 2. Severe motion artifact of time-of-flight MRA. No new proximal large vessel occlusion identified. These results will be called to the ordering clinician or representative by the Radiologist Assistant, and communication documented in the PACS or zVision Dashboard. Electronically Signed   By: Kristine Garbe M.D.   On: 05/14/2016 17:57   Ct Angio Chest/abd/pel For Dissection W And/or W/wo  Result Date: 05/16/2016 CLINICAL DATA:  Aortic dissection EXAM: CT ANGIOGRAPHY CHEST, ABDOMEN AND PELVIS TECHNIQUE: Multidetector CT imaging through the chest, abdomen and pelvis was performed using the standard protocol during bolus administration of intravenous contrast. Multiplanar reconstructed images and MIPs were obtained and reviewed to evaluate the vascular anatomy. CONTRAST:  60 cc Isovue 370 COMPARISON:  None. FINDINGS: CTA CHEST FINDINGS Cardiovascular: There is no  evidence of intramural hematoma. There is no evidence of dissection in the ascending aorta. 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 There is no evidence of aortic rupture. There is a small dissection flaps that is 10 mm in vertical length. 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 Maximal diameter of the aorta at this segment is 3.4 cm. The great vessels are patent. There is no obvious pulmonary thromboembolism. Mediastinum/Nodes: There is no evidence of abnormal mediastinal adenopathy. Thyroid is unremarkable. Has offered this is unremarkable. No pericardial effusion. Lungs/Pleura: There is scattered atelectasis in the lungs. No pneumothorax. No pleural effusion. Musculoskeletal: Displaced acute right seventh, eighth, ninth, and tenth rib fractures. There is healing at some of these fractures. The right sixth rib fracture has a subacute appearance. No vertebral compression deformity in the thoracic spine. Review of the MIP images confirms the above findings. CTA ABDOMEN AND PELVIS FINDINGS VASCULAR Aorta: No evidence of aortic aneurysm or dissection within the abdomen. Aorta is patent. Celiac: Patent. Mild atherosclerotic changes at the origin. Accessory left hepatic artery anatomy. SMA: Patent origin with atherosclerotic calcification. Renals: Single right renal artery is patent. There are 2 left renal arteries. The dominant left renal artery is patent. The more diminutive left renal artery is grossly patent, however the origin is somewhat obscured. IMA: IMA is diminutive and patent. Inflow: There are atherosclerotic changes with some irregular plaque at the aortic bifurcation and origin of the common iliac arteries. There are diffuse atherosclerotic changes of the right common iliac artery without significant narrowing. Right external iliac and internal iliac arteries are patent. Atherosclerotic changes of the left common iliac artery are present  without significant narrowing. Left internal and external iliac arteries are patent. Veins: Portal vein is patent. Splenic vein is patent. Renal veins are suboptimally opacified. Review of the MIP images confirms the above findings. NON-VASCULAR Hepatobiliary: Contour of the liver is somewhat nodular. The left lobe is prominent. This appearance has slightly progressed since the prior study. No focal mass. Pancreas: Pancreas is unremarkable. Spleen: Several calcified granulomata are present in the spleen. Adrenals/Urinary Tract: Chronic changes of the kidneys. Simple cysts in the left kidney on image 141 of series 6. Adrenal glands are within normal limits. The bladder is very distended. Stomach/Bowel: Small hiatal hernia. No evidence of small-bowel obstruction.  No focal mass in the colon. Diverticulosis of the colon. Moderate stool burden in the rectum. Lymphatic: There is no evidence of retroperitoneal adenopathy. Reproductive: Uterus is absent.  Adnexa are within normal limits. Other: There is no free fluid. Musculoskeletal: There are healing fractures of the superior and inferior pubic rami. There are severe degenerative changes in the lumbar spine. New L1 compression fracture is present with slight retropulsion of the superior endplate and [id number] loss height anteriorly. Levoscoliosis at L3 is present. Review of the MIP images confirms the above findings. 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 If the patient is hypertensive, aggressive blood pressure control is recommended. No evidence of dissection or aneurysm within the abdominal aorta. Atherosclerotic changes are noted. 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 Multiple right-sided rib fractures as described. Some are healing. The right sixth rib fracture has a subacute appearance. L1 compression fracture of indeterminate age. Electronically Signed   By: Marybelle Killings M.D.   On:  05/16/2016 08:30    Microbiology: Recent Results (from the past 240 hour(s))  MRSA PCR Screening     Status: None   Collection Time: 05/15/16  1:18 AM  Result Value Ref Range Status   MRSA by PCR NEGATIVE NEGATIVE Final    Comment:        The GeneXpert MRSA Assay (FDA approved for NASAL specimens only), is one component of a comprehensive MRSA colonization surveillance program. It is not intended to diagnose MRSA infection nor to guide or monitor treatment for MRSA infections.   C difficile quick scan w PCR reflex     Status: None   Collection Time: 05/16/16 10:01 AM  Result Value Ref Range Status   C Diff antigen NEGATIVE NEGATIVE Final   C Diff toxin NEGATIVE NEGATIVE Final   C Diff interpretation No C. difficile detected.  Final     Labs: Basic Metabolic Panel:  Recent Labs Lab 05/16/16 1502 05/17/16 1017 05/20/16 0146 05/21/16 0341  NA 137 136 136 134*  K 3.5 3.4* 3.2* 4.4  CL 102 101 100* 95*  CO2 23 26 27  32  GLUCOSE 142* 241* 62* 224*  BUN 17 9 10 7   CREATININE 1.10* 0.92 0.99 0.98  CALCIUM 7.6* 7.5* 8.1* 8.3*   Liver Function Tests:  Recent Labs Lab 05/17/16 1017  AST 27  ALT 17  ALKPHOS 72  BILITOT 0.8  PROT 4.8*  ALBUMIN 2.9*   No results for input(s): LIPASE, AMYLASE in the last 168 hours. No results for input(s): AMMONIA in the last 168 hours. CBC:  Recent Labs Lab 05/17/16 1017 05/20/16 0146  WBC 12.0* 8.4  HGB 9.3* 8.9*  HCT 28.5* 27.7*  MCV 88.2 87.1  PLT 184 207   Cardiac Enzymes: No results for input(s): CKTOTAL, CKMB, CKMBINDEX, TROPONINI in the last 168 hours. BNP: BNP (last 3 results) No results for input(s): BNP in the last 8760 hours.  ProBNP (last 3 results) No results for input(s): PROBNP in the last 8760 hours.  CBG:  Recent Labs Lab 05/22/16 1211 05/22/16 1711 05/22/16 2144 05/23/16 0727 05/23/16 1105  GLUCAP 263* 91 177* 238* 378*       Signed:  Dia Crawford, MD [facility] [phone]  pager

## 2016-05-23 NOTE — Clinical Social Work Placement (Signed)
   CLINICAL SOCIAL WORK PLACEMENT  NOTE  Date:  05/23/2016  Patient Details  Name: Marisa [name] MRN: [id number] Date of Birth: [birth year]  Clinical Social Work is seeking post-discharge placement for this patient at the [place] level of care (*CSW will initial, date and re-position this form in  chart as items are completed):  Yes   Patient/family provided with [facility] Work Department's list of facilities offering this level of care within the geographic area requested by the patient (or if unable, by the patient's family).  Yes   Patient/family informed of their freedom to choose among providers that offer the needed level of care, that participate in Medicare, Medicaid or managed care program needed by the patient, have an available bed and are willing to accept the patient.  Yes   Patient/family informed of [facility]'s ownership interest in [facility] and [facility], as well as of the fact that they are under no obligation to receive care at these facilities.  PASRR submitted to EDS on 05/17/16     PASRR number received on       Existing PASRR number confirmed on 05/17/16     FL2 transmitted to all facilities in geographic area requested by pt/family on 05/17/16     FL2 transmitted to all facilities within larger geographic area on       Patient informed that his/her managed care company has contracts with or will negotiate with certain facilities, including the following:        Yes   Patient/family informed of bed offers received.  Patient chooses bed at [facility]     Physician recommends and patient chooses bed at      Patient to be transferred to [facility] on 05/23/16.  Patient to be transferred to facility by [facility]     Patient family notified on 05/23/16 of transfer.  Name of family member notified:  Moody Bruins     PHYSICIAN       Additional Comment:     _______________________________________________ Candie Chroman, LCSW 05/23/2016, 2:59 PM

## 2016-05-23 NOTE — Care Management Important Message (Signed)
Important Message  Patient Details  Name: Marisa [name] MRN: [id number] Date of Birth: [birth year]   Medicare Important Message Given:  Yes    Merle Whitehorn Abena 05/23/2016, 11:05 AM

## 2016-05-23 NOTE — Progress Notes (Signed)
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 PMH including but not limited to dementia, DM, HTN, RA and NSTEMI in 2012.    PT Comments    Pt presented supine in bed with HOB elevated, awake and willing to participate in therapy session. Pt making slow progress with mobility. All VSS throughout. Pt would continue to benefit from skilled physical therapy services at this time while admitted and after d/c to address her limitations in order to improve her overall safety and independence with functional mobility.    Follow Up Recommendations  SNF;Supervision/Assistance - 24 hour     Equipment Recommendations  None recommended by PT    Recommendations for Other Services       Precautions / Restrictions Precautions Precautions: Fall Precaution Comments: uses 02 at home Restrictions Weight Bearing Restrictions: No    Mobility  Bed Mobility Overal bed mobility: Needs Assistance Bed Mobility: Supine to Sit     Supine to sit: Min assist     General bed mobility comments: pt with use of rail and min assist to pivot to EOB with increased time  Transfers Overall transfer level: Needs assistance Equipment used: Rolling walker (2 wheeled) Transfers: Sit to/from Stand Sit to Stand: +2 physical assistance;Min assist         General transfer comment: increased time, min A x2 to rise from bed and to steady once standing  Ambulation/Gait Ambulation/Gait assistance: +2 physical assistance;+2 safety/equipment;Mod assist Ambulation Distance (Feet): 20 Feet Assistive device: Rolling walker (2 wheeled) Gait Pattern/deviations: Step-to pattern;Shuffle;Trunk flexed Gait velocity: decreased Gait velocity interpretation: Below normal speed for age/gender General  Gait Details: pt required constant mod A x2 for stability, as well as movement and safety with use of RW. Pt very unsteady and tremoring during ambulation.   Stairs            Wheelchair Mobility    Modified Rankin (Stroke Patients Only)       Balance Overall balance assessment: Needs assistance   Sitting balance-Leahy Scale: Fair Sitting balance - Comments: able to sit EOB with min guard for safety   Standing balance support: Bilateral upper extremity supported Standing balance-Leahy Scale: Poor Standing balance comment: mod A x2 to maintain upright standing with RW                    Cognition Arousal/Alertness: Awake/alert Behavior During Therapy: WFL for tasks assessed/performed Overall Cognitive Status: History of cognitive impairments - at baseline                 General Comments: pt with dementia at baseline    Exercises      General Comments        Pertinent Vitals/Pain Pain Assessment: No/denies pain    Home Living                      Prior Function            PT Goals (current goals can now be found in the care plan section) Acute Rehab PT Goals Patient Stated Goal: to see her husband, Marisa [name] PT Goal Formulation: Patient unable to participate in goal setting Time For Goal Achievement: 05/29/16 Potential to Achieve Goals: Fair Progress towards PT goals: Progressing toward goals  Frequency    Min 3X/week      PT Plan Current plan remains appropriate    Co-evaluation             End of Session Equipment Utilized During Treatment: Gait belt Activity Tolerance: Patient tolerated treatment well Patient left: in chair;with call bell/phone within reach;with chair alarm set     Time: [id number] PT Time Calculation (min) (ACUTE ONLY): 13 min  Charges:  $Gait Training: 8-22 mins                    G CodesClearnce Sorrel Florentino Laabs June 01, 2016, 10:13 AM Sherie Don, [place], DPT [phone]

## 2016-05-28 ENCOUNTER — Ambulatory Visit: Payer: Medicare Other | Admitting: Sports Medicine

## 2016-05-29 DIAGNOSIS — I638 Other cerebral infarction: Secondary | ICD-10-CM | POA: Diagnosis not present

## 2016-05-29 DIAGNOSIS — I7770 Dissection of unspecified artery: Secondary | ICD-10-CM | POA: Diagnosis not present

## 2016-05-29 DIAGNOSIS — I10 Essential (primary) hypertension: Secondary | ICD-10-CM | POA: Diagnosis not present

## 2016-05-29 DIAGNOSIS — E119 Type 2 diabetes mellitus without complications: Secondary | ICD-10-CM | POA: Diagnosis not present

## 2016-05-31 ENCOUNTER — Emergency Department (HOSPITAL_COMMUNITY): Payer: Medicare Other

## 2016-05-31 ENCOUNTER — Inpatient Hospital Stay (HOSPITAL_COMMUNITY)
Admission: EM | Admit: 2016-05-31 | Discharge: 2016-06-04 | DRG: 872 | Disposition: A | Discharge status: Skilled Nursing Facility | Payer: Medicare Other | Attending: Internal Medicine | Admitting: Internal Medicine

## 2016-05-31 ENCOUNTER — Encounter (HOSPITAL_COMMUNITY): Payer: Self-pay | Admitting: Emergency Medicine

## 2016-05-31 DIAGNOSIS — E2740 Unspecified adrenocortical insufficiency: Secondary | ICD-10-CM | POA: Diagnosis present

## 2016-05-31 DIAGNOSIS — E860 Dehydration: Secondary | ICD-10-CM | POA: Diagnosis present

## 2016-05-31 DIAGNOSIS — R6520 Severe sepsis without septic shock: Secondary | ICD-10-CM | POA: Diagnosis present

## 2016-05-31 DIAGNOSIS — E11649 Type 2 diabetes mellitus with hypoglycemia without coma: Secondary | ICD-10-CM | POA: Diagnosis not present

## 2016-05-31 DIAGNOSIS — E875 Hyperkalemia: Secondary | ICD-10-CM | POA: Diagnosis present

## 2016-05-31 DIAGNOSIS — I63213 Cerebral infarction due to unspecified occlusion or stenosis of bilateral vertebral arteries: Secondary | ICD-10-CM | POA: Diagnosis not present

## 2016-05-31 DIAGNOSIS — Z885 Allergy status to narcotic agent status: Secondary | ICD-10-CM | POA: Diagnosis not present

## 2016-05-31 DIAGNOSIS — Z7902 Long term (current) use of antithrombotics/antiplatelets: Secondary | ICD-10-CM

## 2016-05-31 DIAGNOSIS — Z880 Allergy status to penicillin: Secondary | ICD-10-CM | POA: Diagnosis not present

## 2016-05-31 DIAGNOSIS — M0689 Other specified rheumatoid arthritis, multiple sites: Secondary | ICD-10-CM | POA: Diagnosis not present

## 2016-05-31 DIAGNOSIS — Z7952 Long term (current) use of systemic steroids: Secondary | ICD-10-CM | POA: Diagnosis not present

## 2016-05-31 DIAGNOSIS — E1129 Type 2 diabetes mellitus with other diabetic kidney complication: Secondary | ICD-10-CM | POA: Diagnosis not present

## 2016-05-31 DIAGNOSIS — E1165 Type 2 diabetes mellitus with hyperglycemia: Secondary | ICD-10-CM | POA: Diagnosis not present

## 2016-05-31 DIAGNOSIS — N179 Acute kidney failure, unspecified: Secondary | ICD-10-CM | POA: Diagnosis not present

## 2016-05-31 DIAGNOSIS — Z79899 Other long term (current) drug therapy: Secondary | ICD-10-CM | POA: Diagnosis not present

## 2016-05-31 DIAGNOSIS — I639 Cerebral infarction, unspecified: Secondary | ICD-10-CM | POA: Diagnosis present

## 2016-05-31 DIAGNOSIS — E118 Type 2 diabetes mellitus with unspecified complications: Secondary | ICD-10-CM

## 2016-05-31 DIAGNOSIS — Z794 Long term (current) use of insulin: Secondary | ICD-10-CM | POA: Diagnosis not present

## 2016-05-31 DIAGNOSIS — L8990 Pressure ulcer of unspecified site, unspecified stage: Secondary | ICD-10-CM | POA: Diagnosis present

## 2016-05-31 DIAGNOSIS — I638 Other cerebral infarction: Secondary | ICD-10-CM | POA: Diagnosis not present

## 2016-05-31 DIAGNOSIS — Z90710 Acquired absence of both cervix and uterus: Secondary | ICD-10-CM | POA: Diagnosis not present

## 2016-05-31 DIAGNOSIS — IMO0002 Reserved for concepts with insufficient information to code with codable children: Secondary | ICD-10-CM | POA: Diagnosis present

## 2016-05-31 DIAGNOSIS — Z888 Allergy status to other drugs, medicaments and biological substances status: Secondary | ICD-10-CM | POA: Diagnosis not present

## 2016-05-31 DIAGNOSIS — Z8249 Family history of ischemic heart disease and other diseases of the circulatory system: Secondary | ICD-10-CM

## 2016-05-31 DIAGNOSIS — R531 Weakness: Secondary | ICD-10-CM | POA: Diagnosis not present

## 2016-05-31 DIAGNOSIS — Z66 Do not resuscitate: Secondary | ICD-10-CM | POA: Diagnosis present

## 2016-05-31 DIAGNOSIS — A419 Sepsis, unspecified organism: Secondary | ICD-10-CM | POA: Diagnosis not present

## 2016-05-31 DIAGNOSIS — R4182 Altered mental status, unspecified: Secondary | ICD-10-CM | POA: Diagnosis not present

## 2016-05-31 DIAGNOSIS — R41841 Cognitive communication deficit: Secondary | ICD-10-CM | POA: Diagnosis not present

## 2016-05-31 DIAGNOSIS — I10 Essential (primary) hypertension: Secondary | ICD-10-CM | POA: Diagnosis present

## 2016-05-31 DIAGNOSIS — R509 Fever, unspecified: Secondary | ICD-10-CM | POA: Diagnosis not present

## 2016-05-31 DIAGNOSIS — Z7982 Long term (current) use of aspirin: Secondary | ICD-10-CM

## 2016-05-31 DIAGNOSIS — E861 Hypovolemia: Secondary | ICD-10-CM | POA: Diagnosis present

## 2016-05-31 DIAGNOSIS — Z823 Family history of stroke: Secondary | ICD-10-CM

## 2016-05-31 DIAGNOSIS — Z8673 Personal history of transient ischemic attack (TIA), and cerebral infarction without residual deficits: Secondary | ICD-10-CM

## 2016-05-31 DIAGNOSIS — R404 Transient alteration of awareness: Secondary | ICD-10-CM | POA: Diagnosis not present

## 2016-05-31 DIAGNOSIS — M6281 Muscle weakness (generalized): Secondary | ICD-10-CM | POA: Diagnosis not present

## 2016-05-31 DIAGNOSIS — R262 Difficulty in walking, not elsewhere classified: Secondary | ICD-10-CM | POA: Diagnosis not present

## 2016-05-31 DIAGNOSIS — M069 Rheumatoid arthritis, unspecified: Secondary | ICD-10-CM | POA: Diagnosis present

## 2016-05-31 DIAGNOSIS — E871 Hypo-osmolality and hyponatremia: Secondary | ICD-10-CM | POA: Diagnosis not present

## 2016-05-31 DIAGNOSIS — I6930 Unspecified sequelae of cerebral infarction: Secondary | ICD-10-CM | POA: Diagnosis not present

## 2016-05-31 DIAGNOSIS — I252 Old myocardial infarction: Secondary | ICD-10-CM

## 2016-05-31 DIAGNOSIS — R1312 Dysphagia, oropharyngeal phase: Secondary | ICD-10-CM | POA: Diagnosis not present

## 2016-05-31 DIAGNOSIS — N189 Chronic kidney disease, unspecified: Secondary | ICD-10-CM | POA: Diagnosis not present

## 2016-05-31 DIAGNOSIS — D649 Anemia, unspecified: Secondary | ICD-10-CM | POA: Diagnosis present

## 2016-05-31 DIAGNOSIS — F329 Major depressive disorder, single episode, unspecified: Secondary | ICD-10-CM | POA: Diagnosis present

## 2016-05-31 LAB — BASIC METABOLIC PANEL
Anion gap: 10 (ref 5–15)
Anion gap: 14 (ref 5–15)
BUN: 34 mg/dL — AB (ref 6–20)
BUN: 31 mg/dL — ABNORMAL HIGH (ref 6–20)
CHLORIDE: 82 mmol/L — AB (ref 101–111)
CHLORIDE: 84 mmol/L — AB (ref 101–111)
CO2: 23 mmol/L (ref 22–32)
CO2: 19 mmol/L — AB (ref 22–32)
CREATININE: 1.61 mg/dL — AB (ref 0.44–1.00)
CREATININE: 1.47 mg/dL — AB (ref 0.44–1.00)
Calcium: 7.4 mg/dL — ABNORMAL LOW (ref 8.9–10.3)
Calcium: 7.2 mg/dL — ABNORMAL LOW (ref 8.9–10.3)
GFR calc Af Amer: 35 mL/min — ABNORMAL LOW (ref >60)
GFR calc non Af Amer: 30 mL/min — ABNORMAL LOW (ref >60)
GFR calc non Af Amer: 34 mL/min — ABNORMAL LOW (ref >60)
GFR, EST AFRICAN AMERICAN: 39 mL/min — AB (ref >60)
Glucose, Bld: 410 mg/dL — ABNORMAL HIGH (ref 65–99)
Glucose, Bld: 491 mg/dL — ABNORMAL HIGH (ref 65–99)
Potassium: 5.7 mmol/L — ABNORMAL HIGH (ref 3.5–5.1)
Potassium: 6.2 mmol/L — ABNORMAL HIGH (ref 3.5–5.1)
Sodium: 115 mmol/L — CL (ref 135–145)
Sodium: 117 mmol/L — CL (ref 135–145)

## 2016-05-31 LAB — COMPREHENSIVE METABOLIC PANEL
ALBUMIN: 3.2 g/dL — AB (ref 3.5–5.0)
ALT: 18 U/L (ref 14–54)
AST: 23 U/L (ref 15–41)
Alkaline Phosphatase: 105 U/L (ref 38–126)
Anion gap: 12 (ref 5–15)
BUN: 38 mg/dL — AB (ref 6–20)
CHLORIDE: 78 mmol/L — AB (ref 101–111)
CO2: 25 mmol/L (ref 22–32)
CREATININE: 1.79 mg/dL — AB (ref 0.44–1.00)
Calcium: 8.3 mg/dL — ABNORMAL LOW (ref 8.9–10.3)
GFR calc Af Amer: 31 mL/min — ABNORMAL LOW (ref >60)
GFR calc non Af Amer: 27 mL/min — ABNORMAL LOW (ref >60)
GLUCOSE: 397 mg/dL — AB (ref 65–99)
Potassium: 5.3 mmol/L — ABNORMAL HIGH (ref 3.5–5.1)
Sodium: 115 mmol/L — CL (ref 135–145)
Total Bilirubin: 0.7 mg/dL (ref 0.3–1.2)
Total Protein: 5.8 g/dL — ABNORMAL LOW (ref 6.5–8.1)

## 2016-05-31 LAB — TSH: TSH: 0.861 u[IU]/mL (ref 0.350–4.500)

## 2016-05-31 LAB — PROTIME-INR
INR: 0.92
PROTHROMBIN TIME: 12.3 s (ref 11.4–15.2)

## 2016-05-31 LAB — CBC
HCT: 23.2 % — ABNORMAL LOW (ref 36.0–46.0)
HEMOGLOBIN: 8.4 g/dL — AB (ref 12.0–15.0)
MCH: 29.7 pg (ref 26.0–34.0)
MCHC: 36.2 g/dL — ABNORMAL HIGH (ref 30.0–36.0)
MCV: 82 fL (ref 78.0–100.0)
Platelets: 361 10*3/uL (ref 150–400)
RBC: 2.83 MIL/uL — AB (ref 3.87–5.11)
RDW: 13.7 % (ref 11.5–15.5)
WBC: 14.2 10*3/uL — AB (ref 4.0–10.5)

## 2016-05-31 LAB — CBG MONITORING, ED: GLUCOSE-CAPILLARY: 390 mg/dL — AB (ref 65–99)

## 2016-05-31 LAB — MRSA PCR SCREENING: MRSA BY PCR: NEGATIVE

## 2016-05-31 LAB — LACTIC ACID, PLASMA: LACTIC ACID, VENOUS: 0.9 mmol/L (ref 0.5–1.9)

## 2016-05-31 LAB — I-STAT CG4 LACTIC ACID, ED: Lactic Acid, Venous: 4.12 mmol/L (ref 0.5–1.9)

## 2016-05-31 LAB — OSMOLALITY: Osmolality: 267 mOsm/kg — ABNORMAL LOW (ref 275–295)

## 2016-05-31 LAB — GLUCOSE, CAPILLARY: GLUCOSE-CAPILLARY: 461 mg/dL — AB (ref 65–99)

## 2016-05-31 MED ORDER — VANCOMYCIN HCL 500 MG IV SOLR: 500.0000 mg | INTRAVENOUS | Status: DC

## 2016-05-31 MED ORDER — SODIUM CHLORIDE 0.9 % IV BOLUS (SEPSIS)
500.0000 mL | Freq: Once | INTRAVENOUS | Status: AC
  Administered 2016-05-31: 500 mL via INTRAVENOUS

## 2016-05-31 MED ORDER — ONDANSETRON HCL 4 MG PO TABS: 4.0000 mg | ORAL_TABLET | Freq: Four times a day (QID) | ORAL | Status: DC | PRN

## 2016-05-31 MED ORDER — METOPROLOL TARTRATE 5 MG/5ML IV SOLN
5.0000 mg | Freq: Once | INTRAVENOUS | Status: AC
Start: 2016-05-31 — End: 2016-05-31
  Administered 2016-05-31: 5 mg via INTRAVENOUS
  Filled 2016-05-31: qty 5

## 2016-05-31 MED ORDER — ACETAMINOPHEN 650 MG RE SUPP
650.0000 mg | Freq: Once | RECTAL | Status: AC
  Administered 2016-05-31: 650 mg via RECTAL
  Filled 2016-05-31: qty 1

## 2016-05-31 MED ORDER — DEXTROSE 5 % IV SOLN
1.0000 g | INTRAVENOUS | Status: DC
  Administered 2016-06-01 – 2016-06-02 (×2): 1 g via INTRAVENOUS
  Filled 2016-05-31 (×4): qty 1

## 2016-05-31 MED ORDER — SODIUM CHLORIDE 0.9% FLUSH
3.0000 mL | Freq: Two times a day (BID) | INTRAVENOUS | Status: DC
  Administered 2016-05-31 – 2016-06-01 (×2): 3 mL via INTRAVENOUS
  Administered 2016-06-01: 10 mL via INTRAVENOUS
  Administered 2016-06-02 (×2): 3 mL via INTRAVENOUS

## 2016-05-31 MED ORDER — SODIUM CHLORIDE 0.9 % IV SOLN
INTRAVENOUS | Status: DC
  Administered 2016-05-31 – 2016-06-04 (×4): via INTRAVENOUS

## 2016-05-31 MED ORDER — VANCOMYCIN HCL IN DEXTROSE 1-5 GM/200ML-% IV SOLN
1000.0000 mg | Freq: Once | INTRAVENOUS | Status: AC
  Administered 2016-05-31: 1000 mg via INTRAVENOUS
  Filled 2016-05-31: qty 200

## 2016-05-31 MED ORDER — HEPARIN SODIUM (PORCINE) 5000 UNIT/ML IJ SOLN
5000.0000 [IU] | Freq: Three times a day (TID) | INTRAMUSCULAR | Status: DC
  Administered 2016-05-31 – 2016-06-04 (×12): 5000 [IU] via SUBCUTANEOUS
  Filled 2016-05-31 (×11): qty 1

## 2016-05-31 MED ORDER — SODIUM CHLORIDE 0.9 % IV BOLUS (SEPSIS)
1000.0000 mL | Freq: Once | INTRAVENOUS | Status: AC
  Administered 2016-05-31: 1000 mL via INTRAVENOUS

## 2016-05-31 MED ORDER — HYDROCORTISONE NA SUCCINATE PF 100 MG IJ SOLR
50.0000 mg | Freq: Two times a day (BID) | INTRAMUSCULAR | Status: DC
  Administered 2016-06-01 – 2016-06-02 (×3): 50 mg via INTRAVENOUS
  Filled 2016-05-31 (×3): qty 2

## 2016-05-31 MED ORDER — DEXTROSE 5 % IV SOLN
2.0000 g | INTRAVENOUS | Status: AC
  Administered 2016-05-31: 2 g via INTRAVENOUS
  Filled 2016-05-31: qty 2

## 2016-05-31 MED ORDER — DEXTROSE 5 % IV SOLN
2.0000 g | Freq: Once | INTRAVENOUS | Status: DC
  Filled 2016-05-31: qty 2

## 2016-05-31 MED ORDER — ACETAMINOPHEN 325 MG PO TABS
650.0000 mg | ORAL_TABLET | Freq: Four times a day (QID) | ORAL | Status: DC | PRN
  Administered 2016-06-01: 650 mg via ORAL
  Filled 2016-05-31: qty 2

## 2016-05-31 MED ORDER — ACETAMINOPHEN 650 MG RE SUPP: 650.0000 mg | Freq: Four times a day (QID) | RECTAL | Status: DC | PRN

## 2016-05-31 MED ORDER — INSULIN GLARGINE 100 UNIT/ML ~~LOC~~ SOLN
5.0000 [IU] | Freq: Every day | SUBCUTANEOUS | Status: DC
  Administered 2016-05-31 – 2016-06-01 (×2): 5 [IU] via SUBCUTANEOUS
  Filled 2016-05-31 (×2): qty 0.05

## 2016-05-31 MED ORDER — INSULIN ASPART 100 UNIT/ML ~~LOC~~ SOLN
0.0000 [IU] | Freq: Three times a day (TID) | SUBCUTANEOUS | Status: DC
  Administered 2016-06-01 (×2): 9 [IU] via SUBCUTANEOUS

## 2016-05-31 MED ORDER — HYDROCORTISONE NA SUCCINATE PF 100 MG IJ SOLR
100.0000 mg | Freq: Once | INTRAMUSCULAR | Status: AC
  Administered 2016-05-31: 100 mg via INTRAVENOUS
  Filled 2016-05-31: qty 2

## 2016-05-31 MED ORDER — METOPROLOL TARTRATE 50 MG PO TABS
100.0000 mg | ORAL_TABLET | Freq: Two times a day (BID) | ORAL | Status: DC
  Administered 2016-06-01 – 2016-06-04 (×7): 100 mg via ORAL
  Filled 2016-05-31 (×2): qty 2
  Filled 2016-05-31: qty 4
  Filled 2016-05-31 (×2): qty 2
  Filled 2016-05-31 (×2): qty 4

## 2016-05-31 MED ORDER — HYDROCODONE-ACETAMINOPHEN 5-325 MG PO TABS
1.0000 | ORAL_TABLET | ORAL | Status: DC | PRN
  Administered 2016-06-01 – 2016-06-02 (×3): 1 via ORAL
  Administered 2016-06-03 (×2): 2 via ORAL
  Administered 2016-06-03 – 2016-06-04 (×2): 1 via ORAL
  Filled 2016-05-31: qty 2
  Filled 2016-05-31: qty 1
  Filled 2016-05-31: qty 2
  Filled 2016-05-31 (×4): qty 1

## 2016-05-31 MED ORDER — ONDANSETRON HCL 4 MG/2ML IJ SOLN: 4.0000 mg | Freq: Four times a day (QID) | INTRAMUSCULAR | Status: DC | PRN

## 2016-05-31 NOTE — ED Provider Notes (Signed)
[facility] DEPT Provider Note   CSN: [id number] Arrival date & time: 05/31/16  1433     History   Chief Complaint Chief Complaint  Patient presents with  . Hyperglycemia    HPI Marisa [name] is a 76 y.o. female.  The history is provided by the spouse. No language interpreter was used.  Hyperglycemia   Marisa [name] is a 76 y.o. female who presents to the Emergency Department complaining of AMS, hyperglycemia.  Level 5 caveat due to altered mental status. History is provided by the patient's husband. He states that she was recently discharged to [facility] following a stroke. She was doing well initially in participating in rehabilitation but over the last few days has developed increased confusion with weakness. She had some loose stools last night. No fevers, vomiting. He does state that she looks more shaky than usual. She has been more confused.  Past Medical History:  Diagnosis Date  . Anemia   . Arthritis   . Asthma    Home 3L O2   . Cancer ([place])   . Chronic anemia   . Depression    patient feels depressed at the end of the month  . Diabetes mellitus type 2, insulin dependent ([place])    initial diagnoses 11/2008  . Diabetes mellitus without complication ([place])   . Diabetic ketoacidosis ([place]) 11/2010  . Diverticulosis   . Esophageal stricture 06/2004   Dilation 06/2004  . Family history of anesthesia complication    DAUGHTER HAS NAUSEA  . GERD (gastroesophageal reflux disease)   . history of  pericarditis 12/2002  . History of blood transfusion   . Hypertension   . Myocardial infarction    Chest pain s/p normal cath in 08/2004 then NSTEMI during 11/2010 admission, negative Myoview  . Osteoarthritis   . Oxygen deficiency   . Pericardial effusion   . Rectal bleeding 11/2010   Large hemorrhoids  . Rheumatoid arteritis   . Rheumatoid arthritis(714.0)    On MTX and chronic steroids  . Seizure ([place]) 11/2008  . Supraventricular tachycardia [facility])       Patient Active Problem List   Diagnosis Date Noted  . Hyponatremia 05/31/2016  . Cerebrovascular accident (CVA) ([place])   . Uncontrolled type 2 diabetes mellitus with complication ([place])   . CKD (chronic kidney disease), stage III   . Chronic respiratory failure with hypoxia ([place])   . Panlobular emphysema ([place])   . Stroke ([place])   . Diplopia   . Brainstem infarct, acute ([place]) 05/14/2016  . Aortic dissection ([place]) 05/14/2016  . Diabetes mellitus with complication ([place])   . Rib fractures 01/18/2016  . Chronic renal insufficiency, stage 3 (moderate)   . Memory loss 05/09/2015  . Immunosuppressed status ([place])   . Shoulder effusion 05/28/2014  . Rheumatoid arthritis ([place]) 05/28/2014  . Insulin dependent type 2 diabetes mellitus, uncontrolled ([place]) 05/28/2014  . Symptomatic anemia 04/13/2014  . Iron deficiency anemia due to chronic blood loss 03/07/2013  . Chronic respiratory failure ([place]) 02/11/2013  . 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 Personal history of colonic polyps 01/05/2013  . Protein calorie malnutrition ([place]) 10/08/2011  . Diabetes mellitus type 2, insulin dependent ([place])   . Anxiety state 06/08/2007  . HTN (hypertension) 06/08/2007  . UNSPECIFIED ACUTE PERICARDITIS 06/08/2007  . SINUS ARRHYTHMIA 06/08/2007  . HEMORRHOIDS, INTERNAL 06/08/2007  . ALLERGIC RHINITIS 06/08/2007  . COPD (chronic obstructive pulmonary disease) ([place]) 06/08/2007  . ESOPHAGEAL STRICTURE 06/08/2007  . GERD 06/08/2007  .  CONSTIPATION, CHRONIC 06/08/2007  . IRRITABLE BOWEL SYNDROME 06/08/2007  . SLEEP APNEA 06/08/2007  . HEADACHE, CHRONIC 06/08/2007  . NEOPLASM, MALIGNANT, CARCINOMA, BASAL CELL, NOSE 06/08/2007    Past Surgical History:  Procedure Laterality Date  . ABDOMINAL HYSTERECTOMY  1980  . CHOLECYSTECTOMY    . COLONOSCOPY N/A 01/06/2013   Procedure: COLONOSCOPY;  Surgeon: Ladene Artist, MD;  Location[facility] ENDOSCOPY;  Service: Endoscopy;  Laterality: N/A;  . ESOPHAGOGASTRODUODENOSCOPY  N/A 02/11/2013   Procedure: ESOPHAGOGASTRODUODENOSCOPY (EGD);  Surgeon: Irene Shipper, MD;  Location: [facility] ENDOSCOPY;  Service: Endoscopy;  Laterality: N/A;  . INCISE AND DRAIN ABCESS  09/2011   I&D of peri-rectal abcess per Dr Zella Richer.     OB History    Gravida Para Term Preterm AB Living   0 0 0 0 0     SAB TAB Ectopic Multiple Live Births   0 0 0           Home Medications    Prior to Admission medications   Medication Sig Start Date End Date Taking? Authorizing Provider  amLODipine (NORVASC) 10 MG tablet Take 1 tablet (10 mg total) by mouth daily. 05/24/16   Allie Bossier, MD  aspirin 325 MG tablet Take 1 tablet (325 mg total) by mouth daily. 05/24/16   Allie Bossier, MD  citalopram (CELEXA) 10 MG tablet Take 1 tablet (10 mg total) by mouth daily. 05/24/16   Allie Bossier, MD  clopidogrel (PLAVIX) 75 MG tablet Take 1 tablet (75 mg total) by mouth daily. 05/24/16   Allie Bossier, MD  Coenzyme Q10 (CO Q 10) 100 MG CAPS Take 100 mg by mouth daily.    Historical Provider, MD  dicyclomine (BENTYL) 10 MG/5ML syrup Take 5 mLs (10 mg total) by mouth 2 (two) times daily. 05/23/16   Allie Bossier, MD  diphenhydrAMINE (BENADRYL) 25 MG tablet Take 25 mg by mouth every 8 (eight) hours as needed for allergies.     Historical Provider, MD  docusate sodium (COLACE) 100 MG capsule Take 1 capsule (100 mg total) by mouth daily as needed for mild constipation. 05/23/16   Allie Bossier, MD  donepezil (ARICEPT) 10 MG tablet Take 1 tablet (10 mg total) by mouth at bedtime. 05/23/16   Allie Bossier, MD  estrogens, conjugated, (PREMARIN) 0.625 MG tablet Take 1 tablet (0.625 mg total) by mouth daily. 05/23/16   Allie Bossier, MD  Fe Fum-FePoly-Vit C-Vit B3 (INTEGRA PO) Take 1 capsule by mouth 2 (two) times daily. Supplement    Historical Provider, MD  fluticasone (FLONASE) 50 MCG/ACT nasal spray Place 2 sprays into both nostrils daily as needed for allergies or rhinitis. 05/23/16   Allie Bossier, MD  Glucosamine-Fish  Oil-EPA-DHA (GLUCOSAMINE & FISH OIL PO) Take 1,000 mg by mouth 2 (two) times daily.     Historical Provider, MD  insulin aspart (NOVOLOG) 100 UNIT/ML injection Inject 8 Units into the skin 3 (three) times daily with meals. 05/23/16   Allie Bossier, MD  insulin detemir (LEVEMIR) 100 UNIT/ML injection Inject 0.06 mLs (6 Units total) into the skin daily. 05/24/16   Allie Bossier, MD  ipratropium-albuterol (DUONEB) 0.5-2.5 (3) MG/3ML SOLN Take 3 mLs by nebulization every 6 (six) hours as needed. 05/23/16   Allie Bossier, MD  leflunomide (ARAVA) 20 MG tablet Take 1 tablet (20 mg total) by mouth daily. 05/23/16   Allie Bossier, MD  levalbuterol [facility] HFA) 45 MCG/ACT inhaler Inhale 2 puffs  into the lungs every 4 (four) hours as needed for wheezing (wheezing). 05/23/16   Allie Bossier, MD  lisinopril (PRINIVIL,ZESTRIL) 5 MG tablet Take 1 tablet (5 mg total) by mouth daily. 05/24/16   Allie Bossier, MD  loperamide (IMODIUM) 2 MG capsule Take 1 capsule (2 mg total) by mouth as needed for diarrhea or loose stools. 05/23/16   Allie Bossier, MD  LORazepam (ATIVAN) 0.5 MG tablet Take 1 tablet (0.5 mg total) by mouth every 4 (four) hours as needed for anxiety. 05/23/16   Allie Bossier, MD  Menthol-Methyl Salicylate (MUSCLE RUB) 10-15 % CREA Apply 1 application topically daily. 05/24/16   Allie Bossier, MD  metoprolol (LOPRESSOR) 100 MG tablet Take 1 tablet (100 mg total) by mouth 2 (two) times daily. 05/23/16   Allie Bossier, MD  Multiple Vitamin (MULTIVITAMINS PO) Take 1 tablet by mouth. Daily.     Historical Provider, MD  Nutritional Supplements (ESTROVEN PO) Take 1 capsule by mouth daily.    Historical Provider, MD  polyvinyl alcohol (LIQUIFILM TEARS) 1.4 % ophthalmic solution Place 1 drop into both eyes daily as needed (dry eyes). 05/23/16   Allie Bossier, MD  pravastatin (PRAVACHOL) 20 MG tablet Take 1 tablet (20 mg total) by mouth daily at 6 PM. 05/23/16   Allie Bossier, MD  predniSONE (DELTASONE) 10 MG tablet Take 1  tablet (10 mg total) by mouth daily. 05/24/16   Allie Bossier, MD  shark liver oil-cocoa butter (PREPARATION H) 0.25-3-85.5 % suppository Place 1 suppository rectally 2 (two) times daily. Take 1 suppository BID x1 week, ending 02/17/16, then decrease to once qd for hemorrhoids 02/10/16   Historical Provider, MD  simethicone (MYLICON) 40 [id number] drops Take 1.2 mLs (80 mg total) by mouth 4 (four) times daily. 05/23/16   Allie Bossier, MD  vitamin C (ASCORBIC ACID) 500 MG tablet Take 500 mg by mouth 2 (two) times daily.     Historical Provider, MD  VITAMIN K, PHYTONADIONE, PO Take 100 mcg by mouth daily. Supplement    Historical Provider, MD  XIIDRA 5 % SOLN Place 1 drop into both eyes 2 (two) times daily as needed (dry eyes).  12/02/15   Historical Provider, MD    Family History Family History  Problem Relation Age of Onset  . Heart disease Mother   . Stroke Father   . Dementia Neg Hx     Social History Social History  Substance Use Topics  . Smoking status: Never Smoker  . Smokeless tobacco: Never Used  . Alcohol use No     Allergies   Demerol [meperidine]; Penicillins; Enbrel [etanercept]; and Humira [adalimumab]   Review of Systems Review of Systems  Unable to perform ROS: Mental status change     Physical Exam Updated Vital Signs BP 166/80 (BP Location: Right Arm)   Pulse 103   Temp 101.8 F (38.8 C) (Rectal)   Resp (!) 28   Ht 4\' 11"  (1.499 m)   Wt 100 lb (45.4 kg)   SpO2 99%   BMI 20.20 kg/m   Physical Exam  Constitutional: She appears well-developed. She appears distressed.  Ill appearing  HENT:  Head: Normocephalic and atraumatic.  Cardiovascular: Normal rate and regular rhythm.   No murmur heard. Pulmonary/Chest: Effort normal and breath sounds normal. No respiratory distress.  Abdominal: Soft. There is no tenderness. There is no rebound and no guarding.  Musculoskeletal: She exhibits no edema or tenderness.  Neurological:  Disconjugate gaze,  confused.  Drowsy but arousable to verbal stimuli. Profound generalized weakness.  Skin: Skin is warm and dry.  Psychiatric: She has a normal mood and affect. Her behavior is normal.  Nursing note and vitals reviewed.    ED Treatments / Results  Labs (all labs ordered are listed, but only abnormal results are displayed) Labs Reviewed  CBC - Abnormal; Notable for the following:       Result Value   WBC 14.2 (*)    RBC 2.83 (*)    Hemoglobin 8.4 (*)    HCT 23.2 (*)    MCHC 36.2 (*)    All other components within normal limits  COMPREHENSIVE METABOLIC PANEL - Abnormal; Notable for the following:    Sodium 115 (*)    Potassium 5.3 (*)    Chloride 78 (*)    Glucose, Bld 397 (*)    BUN 38 (*)    Creatinine, Ser 1.79 (*)    Calcium 8.3 (*)    Total Protein 5.8 (*)    Albumin 3.2 (*)    GFR calc non Af Amer 27 (*)    GFR calc Af Amer 31 (*)    All other components within normal limits  CBG MONITORING, ED - Abnormal; Notable for the following:    Glucose-Capillary 390 (*)    All other components within normal limits  I-STAT CG4 LACTIC ACID, ED - Abnormal; Notable for the following:    Lactic Acid, Venous 4.12 (*)    All other components within normal limits  CULTURE, BLOOD (ROUTINE X 2)  CULTURE, BLOOD (ROUTINE X 2)  URINE CULTURE  BLOOD GAS, VENOUS  URINALYSIS, ROUTINE W REFLEX MICROSCOPIC  OSMOLALITY, URINE  SODIUM, URINE, RANDOM  OSMOLALITY  LACTIC ACID, PLASMA    EKG  EKG Interpretation None       Radiology Ct Head Wo Contrast  Result Date: 05/31/2016 CLINICAL DATA:  76 y/o  F; altered mental status. EXAM: CT HEAD WITHOUT CONTRAST TECHNIQUE: Contiguous axial images were obtained from the base of the skull through the vertex without intravenous contrast. COMPARISON:  05/14/2016 MRI of the brain. 05/14/2016 CT of the head. FINDINGS: Brain: No evidence of acute infarction, hemorrhage, hydrocephalus, extra-axial collection or mass lesion/mass effect. Stable small chronic  infarcts in right frontal white matter and the left cerebellar hemisphere. Stable moderate chronic microvascular ischemic changes and mild parenchymal volume loss of the brain. Vascular: Calcific atherosclerosis of cavernous and paraclinoid internal carotid arteries. Skull: Normal. Negative for fracture or focal lesion. Sinuses/Orbits: No acute finding. Other: Bilateral intra-ocular lens replacement. IMPRESSION: 1. No acute intracranial abnormality is identified. 2. Stable moderate chronic microvascular ischemic changes and mild parenchymal volume loss of the brain. Stable chronic infarcts in right frontal white matter and left cerebellar hemisphere. Electronically Signed   By: Kristine Garbe M.D.   On: 05/31/2016 15:49   Dg Chest Port 1 View  Result Date: 05/31/2016 CLINICAL DATA:  Altered mental status EXAM: PORTABLE CHEST 1 VIEW COMPARISON:  01/19/2016 FINDINGS: Heart size normal. Ectasia of the thoracic aorta. Negative for heart failure. Negative for pneumonia or effusion. Chronic right-sided rib fractures. Apical scarring bilaterally. Chronic shoulder arthropathy bilaterally. IMPRESSION: No active disease. Electronically Signed   By: Franchot Gallo M.D.   On: 05/31/2016 15:38    Procedures Procedures (including critical care time) CRITICAL CARE Performed by: Quintella Reichert   Total critical care time: 35 minutes  Critical care time was exclusive of separately billable procedures and treating other patients.  Critical care  was necessary to treat or prevent imminent or life-threatening deterioration.  Critical care was time spent personally by me on the following activities: development of treatment plan with patient and/or surrogate as well as nursing, discussions with consultants, evaluation of patient's response to treatment, examination of patient, obtaining history from patient or surrogate, ordering and performing treatments and interventions, ordering and review of laboratory  studies, ordering and review of radiographic studies, pulse oximetry and re-evaluation of patient's condition.  Medications Ordered in ED Medications  vancomycin (VANCOCIN) 500 mg in sodium chloride 0.9 % 100 mL IVPB (not administered)  ceFEPIme (MAXIPIME) 1 g in dextrose 5 % 50 mL IVPB (not administered)  sodium chloride 0.9 % bolus 500 mL (0 mLs Intravenous Stopped 05/31/16 1632)  vancomycin (VANCOCIN) IVPB 1000 mg/200 mL premix (1,000 mg Intravenous New Bag/Given 05/31/16 1619)  hydrocortisone sodium succinate (SOLU-CORTEF) 100 MG injection 100 mg (100 mg Intravenous Given 05/31/16 1619)  ceFEPIme (MAXIPIME) 2 g in dextrose 5 % 50 mL IVPB (0 g Intravenous Stopped 05/31/16 1649)  sodium chloride 0.9 % bolus 1,000 mL (1,000 mLs Intravenous New Bag/Given 05/31/16 1717)  acetaminophen (TYLENOL) suppository 650 mg (650 mg Rectal Given 05/31/16 1717)     Initial Impression / Assessment and Plan / ED Course  I have reviewed the triage vital signs and the nursing notes.  Pertinent labs & imaging results that were available during my care of the patient were reviewed by me and considered in my medical decision making (see chart for details).     Patient with recent infarct here with hyperglycemia and worsening mental status. She is ill-appearing on examination. BMP demonstrates acute hyponatremia as well as acute kidney injury. She has profound change in her mental status but no reports of seizures and she is not actively seizing in the emergency department. Providing IV fluid hydration. Records appear that she has been on chronic steroids, will provide stress states steroids. She is febrile with elevated lactate, treating for sepsis of unclear source. Nephrology consultation regarding assistance with acute hyponatremia. Discussed case with critical care, further treatment was deferred to hospitalist service. Hospitalist consulted for admission for further treatment.  Discussed with patient's husband  goals of care and CODE STATUS. He states that he does not think she would want CPR or endotracheal intubation if she were to worsen.  Pt with sepsis but initial aggressive fluid hydration held given her hyponatremia and concern for not overcorrecting her sodium.    Final Clinical Impressions(s) / ED Diagnoses   Final diagnoses:  Hyponatremia  Sepsis, due to unspecified organism [facility])    New Prescriptions New Prescriptions   No medications on file     Quintella Reichert, MD 05/31/16 1738

## 2016-05-31 NOTE — Consult Note (Signed)
[name] Consult Note     Date: 05/31/2016                  Patient Name:  Marisa [name]  MRN: [id number]  DOB: 03/27/1941  Age / Sex: 76 y.o., female         PCP: Marisa Gravel, MD                 Service Requesting Consult: ED- Dr. Ralene Bathe                 Reason for Consult: hyponatremia            Chief Complaint: weakness  HPI: Pt is a 45F with a PMH significant for DM II , HTN, rheumatoid arthritis, h/o seizures (2010), and recent strokes who is now seen at the request of Dr. Ralene Bathe for evaluation and recommendations surrounding hyponatremia.    Pt was recently admitted for acute strokes and was discharged to a SNF on 05/23/16.  She did well at the SNF for a day or two and then became progressively weaker.  Per family, she has not been eating or drinking well.  She was unable to feed herself today and so her family brought her to the ED .  She was found to be febrile, tachycardic, with an increased leukocytosis and a lactate of 4.  She has hyponatremia with a Na of 115, mild hyperkalemia to 5.3, and an AKI.  She is able to intermittently respond to questions but doesn't answer them consistently.  Looking back at her chart she has had intermittent problems with hyponatremia.     Past Medical History:  Diagnosis Date  . Anemia   . Arthritis   . Asthma    Home 3L O2   . Cancer ([place])   . Chronic anemia   . Depression    patient feels depressed at the end of the month  . Diabetes mellitus type 2, insulin dependent ([place])    initial diagnoses 11/2008  . Diabetes mellitus without complication ([place])   . Diabetic ketoacidosis ([place]) 11/2010  . Diverticulosis   . Esophageal stricture 06/2004   Dilation 06/2004  . Family history of anesthesia complication    DAUGHTER HAS NAUSEA  . GERD (gastroesophageal reflux disease)   . history of  pericarditis 12/2002  . History of blood transfusion   . Hypertension   . Myocardial infarction    Chest pain s/p normal cath in  08/2004 then NSTEMI during 11/2010 admission, negative Myoview  . Osteoarthritis   . Oxygen deficiency   . Pericardial effusion   . Rectal bleeding 11/2010   Large hemorrhoids  . Rheumatoid arteritis   . Rheumatoid arthritis(714.0)    On MTX and chronic steroids  . Seizure ([place]) 11/2008  . Supraventricular tachycardia [facility])     Past Surgical History:  Procedure Laterality Date  . ABDOMINAL HYSTERECTOMY  1980  . CHOLECYSTECTOMY    . COLONOSCOPY N/A 01/06/2013   Procedure: COLONOSCOPY;  Surgeon: Ladene Artist, MD;  Location: [facility] ENDOSCOPY;  Service: Endoscopy;  Laterality: N/A;  . ESOPHAGOGASTRODUODENOSCOPY N/A 02/11/2013   Procedure: ESOPHAGOGASTRODUODENOSCOPY (EGD);  Surgeon: Irene Shipper, MD;  Location: [facility] ENDOSCOPY;  Service: Endoscopy;  Laterality: N/A;  . INCISE AND DRAIN ABCESS  09/2011   I&D of peri-rectal abcess per Dr Zella Richer.     Family History  Problem Relation Age of Onset  . Heart disease Mother   . Stroke Father   . Dementia Neg  Hx    Social History:  reports that she has never smoked. She has never used smokeless tobacco. She reports that she does not drink alcohol or use drugs.  Allergies:  Allergies  Allergen Reactions  . Demerol [Meperidine] Other (See Comments)    Hallucinations  . Penicillins Swelling and Other (See Comments)    Throat swelling Has patient had a PCN reaction causing immediate rash, facial/tongue/throat swelling, SOB or lightheadedness with hypotension: Yes Has patient had a PCN reaction causing severe rash involving mucus membranes or skin necrosis: No Has patient had a PCN reaction that required hospitalization No Has patient had a PCN reaction occurring within the last 10 years: No If all of the above answers are "NO", then may proceed with Cephalosporin use.   . Enbrel [Etanercept] Swelling    Arm swelling   . Humira [Adalimumab] Swelling    Arm swelling      (Not in a hospital admission)  Results for orders placed or  performed during the hospital encounter of 05/31/16 (from the past 48 hour(s))  CBG monitoring, ED     Status: Abnormal   Collection Time: 05/31/16  3:00 PM  Result Value Ref Range   Glucose-Capillary 390 (H) 65 - 99 mg/dL  CBC     Status: Abnormal   Collection Time: 05/31/16  3:05 PM  Result Value Ref Range   WBC 14.2 (H) 4.0 - 10.5 K/uL   RBC 2.83 (L) 3.87 - 5.11 MIL/uL   Hemoglobin 8.4 (L) 12.0 - 15.0 g/dL   HCT 23.2 (L) 36.0 - 46.0 %   MCV 82.0 78.0 - 100.0 fL   MCH 29.7 26.0 - 34.0 pg   MCHC 36.2 (H) 30.0 - 36.0 g/dL   RDW 13.7 11.5 - 15.5 %   Platelets 361 150 - 400 K/uL  Comprehensive metabolic panel     Status: Abnormal   Collection Time: 05/31/16  3:05 PM  Result Value Ref Range   Sodium 115 (LL) 135 - 145 mmol/L    Comment: CRITICAL RESULT CALLED TO, READ BACK BY AND VERIFIED WITH: TALKINGTON,J @ 1610 ON [id number] BY POTEAT,S    Potassium 5.3 (H) 3.5 - 5.1 mmol/L   Chloride 78 (L) 101 - 111 mmol/L   CO2 25 22 - 32 mmol/L   Glucose, Bld 397 (H) 65 - 99 mg/dL   BUN 38 (H) 6 - 20 mg/dL   Creatinine, Ser 1.79 (H) 0.44 - 1.00 mg/dL   Calcium 8.3 (L) 8.9 - 10.3 mg/dL   Total Protein 5.8 (L) 6.5 - 8.1 g/dL   Albumin 3.2 (L) 3.5 - 5.0 g/dL   AST 23 15 - 41 U/L   ALT 18 14 - 54 U/L   Alkaline Phosphatase 105 38 - 126 U/L   Total Bilirubin 0.7 0.3 - 1.2 mg/dL   GFR calc non Af Amer 27 (L) >60 mL/min   GFR calc Af Amer 31 (L) >60 mL/min    Comment: (NOTE) The eGFR has been calculated using the CKD EPI equation. This calculation has not been validated in all clinical situations. eGFR's persistently <60 mL/min signify possible Chronic Kidney Disease.    Anion gap 12 5 - 15  I-Stat CG4 Lactic Acid, ED     Status: Abnormal   Collection Time: 05/31/16  3:34 PM  Result Value Ref Range   Lactic Acid, Venous 4.12 (HH) 0.5 - 1.9 mmol/L   Comment NOTIFIED PHYSICIAN    Ct Head Wo Contrast  Result Date: 05/31/2016 CLINICAL  DATA:  76 y/o  F; altered mental status. EXAM: CT HEAD  WITHOUT CONTRAST TECHNIQUE: Contiguous axial images were obtained from the base of the skull through the vertex without intravenous contrast. COMPARISON:  05/14/2016 MRI of the brain. 05/14/2016 CT of the head. FINDINGS: Brain: No evidence of acute infarction, hemorrhage, hydrocephalus, extra-axial collection or mass lesion/mass effect. Stable small chronic infarcts in right frontal white matter and the left cerebellar hemisphere. Stable moderate chronic microvascular ischemic changes and mild parenchymal volume loss of the brain. Vascular: Calcific atherosclerosis of cavernous and paraclinoid internal carotid arteries. Skull: Normal. Negative for fracture or focal lesion. Sinuses/Orbits: No acute finding. Other: Bilateral intra-ocular lens replacement. IMPRESSION: 1. No acute intracranial abnormality is identified. 2. Stable moderate chronic microvascular ischemic changes and mild parenchymal volume loss of the brain. Stable chronic infarcts in right frontal white matter and left cerebellar hemisphere. Electronically Signed   By: Kristine Garbe M.D.   On: 05/31/2016 15:49   Dg Chest Port 1 View  Result Date: 05/31/2016 CLINICAL DATA:  Altered mental status EXAM: PORTABLE CHEST 1 VIEW COMPARISON:  01/19/2016 FINDINGS: Heart size normal. Ectasia of the thoracic aorta. Negative for heart failure. Negative for pneumonia or effusion. Chronic right-sided rib fractures. Apical scarring bilaterally. Chronic shoulder arthropathy bilaterally. IMPRESSION: No active disease. Electronically Signed   By: Franchot Gallo M.D.   On: 05/31/2016 15:38    ROS: unobtainable due to mental status  Blood pressure 166/80, pulse 103, temperature 101.8 F (38.8 C), temperature source Rectal, resp. rate (!) 28, height '4\' 11"'$  (1.499 m), weight 45.4 kg (100 lb), SpO2 99 %. Physical Exam  GEN: ill-appearing, lying in bed, intermittently responding to questions HEENT +disconjugate gaze, sclerae anicteric, dry MM NECK No  JVD PULM tachypneic, clear  CV tachycardic, III/VI systolic murmur LUSB ABD soft, nontender EXT very trace edema  NEURO: unable to reliably follow commands for formal examination MSK: +bilateral deformities characteristic of RA  Assessment/Plan  1.  Hyponatremia: In the setting of severe sepsis, AKI, mild hyperkalemia, and dry MM, believe primary driver of hyponatremia is hypovolemic hyponatremia.  Unfortunately, there is no available urine sodium, urine osms, or serum osms and time is of the essence for treating her severe sepsis.  Therefore, I would continue to give isotonic fluids as you are doing.  There may be an element of SIADH given the longstanding intermittent nature of her hyponatremia but right now given the clinical picture this is not likely to be primarily responsible. Of course, obtaining the urine studies as well as the serum osms is essential to confirming diagnosis and treating appropriately.  Would obtain q 4 serum sodiums at least.  Would use hypertonic saline if MS deteriorates, if seizure activity is observed, or if serum sodium decreases after NS administration.  Would obtain bladder scan to ensure she's not retaining any urine and would prefer Foley for strict I/O to ensure she is not oliguric.  2.  Severe sepsis: urinary, pulmonary sources possible.  Blood culture obtained, empiric antibiotics, IVF as above, stress dose steroids  3.  Hyperkalemia: likely due to dehydration; expect to improve with IVF  4.  AKI: 2/2 sepsis, treating underlying causes  5.  Possible adrenal insufficiency- stress dose steroids, could partiall explain hyponatremia with concomitant hyperkalemia.   Madelon Lips MD [facility] pgr [phone] 05/31/2016, 5:23 PM

## 2016-05-31 NOTE — H&P (Signed)
History and Physical    Marisa [name] [id number] DOB: June 15, 1940 DOA: 05/31/2016  PCP: Jani Gravel, MD  Patient coming from: Home  Chief Complaint: Fever and hyperglycemia  HPI: Marisa [name] is a 76 y.o. female with medical history significant of recent stroke, discharge from [facility] to nursing home on 05/23/2016 after she was treated for stroke. Patient is lethargic, most of the history obtained from the EDP notes and daughter at bedside. Her daughter mentioned she was doing okay after she was discharged, was able to get up with the help of personnel and walker and actually woke all the way to the bathroom. For the past couple of days she started to be more lethargic, today she developed fever and her blood sugar was high so they brought her to the hospital for further evaluation. She was found to have sodium of 115, acute renal failure and fever of 101.8.  ED Course:  Vitals: Fever of 101.8, RR is 28, blood pressure is normal. Labs: Na 115 K 5.4, BUN 38, Cr is 1.7, WBCs 14.2 and lactic acid is 4.1 Imaging: CT head and CXR without acute findings Interventions: Started on antibiotics given 1.5 L of normal saline.  Review of Systems:  Constitutional: negative for anorexia, fevers and sweats Eyes: negative for irritation, redness and visual disturbance Ears, nose, mouth, throat, and face: negative for earaches, epistaxis, nasal congestion and sore throat Respiratory: negative for cough, dyspnea on exertion, sputum and wheezing Cardiovascular: negative for chest pain, dyspnea, lower extremity edema, orthopnea, palpitations and syncope Gastrointestinal: negative for abdominal pain, constipation, diarrhea, melena, nausea and vomiting Genitourinary:negative for dysuria, frequency and hematuria Hematologic/lymphatic: negative for bleeding, easy bruising and lymphadenopathy Musculoskeletal:negative for arthralgias, muscle weakness and stiff joints Neurological: negative for  coordination problems, gait problems, headaches and weakness Endocrine: negative for diabetic symptoms including polydipsia, polyuria and weight loss Allergic/Immunologic: negative for anaphylaxis, hay fever and urticaria  Past Medical History:  Diagnosis Date  . Anemia   . Arthritis   . Asthma    Home 3L O2   . Cancer ([place])   . Chronic anemia   . Depression    patient feels depressed at the end of the month  . Diabetes mellitus type 2, insulin dependent ([place])    initial diagnoses 11/2008  . Diabetes mellitus without complication ([place])   . Diabetic ketoacidosis ([place]) 11/2010  . Diverticulosis   . Esophageal stricture 06/2004   Dilation 06/2004  . Family history of anesthesia complication    DAUGHTER HAS NAUSEA  . GERD (gastroesophageal reflux disease)   . history of  pericarditis 12/2002  . History of blood transfusion   . Hypertension   . Myocardial infarction    Chest pain s/p normal cath in 08/2004 then NSTEMI during 11/2010 admission, negative Myoview  . Osteoarthritis   . Oxygen deficiency   . Pericardial effusion   . Rectal bleeding 11/2010   Large hemorrhoids  . Rheumatoid arteritis   . Rheumatoid arthritis(714.0)    On MTX and chronic steroids  . Seizure ([place]) 11/2008  . Supraventricular tachycardia [facility])     Past Surgical History:  Procedure Laterality Date  . ABDOMINAL HYSTERECTOMY  1980  . CHOLECYSTECTOMY    . COLONOSCOPY N/A 01/06/2013   Procedure: COLONOSCOPY;  Surgeon: Ladene Artist, MD;  Location: [facility] ENDOSCOPY;  Service: Endoscopy;  Laterality: N/A;  . ESOPHAGOGASTRODUODENOSCOPY N/A 02/11/2013   Procedure: ESOPHAGOGASTRODUODENOSCOPY (EGD);  Surgeon: Irene Shipper, MD;  Location: [facility] ENDOSCOPY;  Service: Endoscopy;  Laterality: N/A;  . INCISE AND DRAIN ABCESS  09/2011   I&D of peri-rectal abcess per Dr Zella Richer.      reports that she has never smoked. She has never used smokeless tobacco. She reports that she does not drink alcohol or use drugs.  Allergies    Allergen Reactions  . Demerol [Meperidine] Other (See Comments)    Hallucinations  . Penicillins Swelling and Other (See Comments)    Throat swelling Has patient had a PCN reaction causing immediate rash, facial/tongue/throat swelling, SOB or lightheadedness with hypotension: Yes Has patient had a PCN reaction causing severe rash involving mucus membranes or skin necrosis: No Has patient had a PCN reaction that required hospitalization No Has patient had a PCN reaction occurring within the last 10 years: No If all of the above answers are "NO", then may proceed with Cephalosporin use.   . Enbrel [Etanercept] Swelling    Arm swelling   . Humira [Adalimumab] Swelling    Arm swelling     Family History  Problem Relation Age of Onset  . Heart disease Mother   . Stroke Father   . Dementia Neg Hx     Prior to Admission medications   Medication Sig Start Date End Date Taking? Authorizing Provider  amLODipine (NORVASC) 10 MG tablet Take 1 tablet (10 mg total) by mouth daily. 05/24/16   Allie Bossier, MD  aspirin 325 MG tablet Take 1 tablet (325 mg total) by mouth daily. 05/24/16   Allie Bossier, MD  citalopram (CELEXA) 10 MG tablet Take 1 tablet (10 mg total) by mouth daily. 05/24/16   Allie Bossier, MD  clopidogrel (PLAVIX) 75 MG tablet Take 1 tablet (75 mg total) by mouth daily. 05/24/16   Allie Bossier, MD  Coenzyme Q10 (CO Q 10) 100 MG CAPS Take 100 mg by mouth daily.    Historical Provider, MD  dicyclomine (BENTYL) 10 MG/5ML syrup Take 5 mLs (10 mg total) by mouth 2 (two) times daily. 05/23/16   Allie Bossier, MD  diphenhydrAMINE (BENADRYL) 25 MG tablet Take 25 mg by mouth every 8 (eight) hours as needed for allergies.     Historical Provider, MD  docusate sodium (COLACE) 100 MG capsule Take 1 capsule (100 mg total) by mouth daily as needed for mild constipation. 05/23/16   Allie Bossier, MD  donepezil (ARICEPT) 10 MG tablet Take 1 tablet (10 mg total) by mouth at bedtime. 05/23/16    Allie Bossier, MD  estrogens, conjugated, (PREMARIN) 0.625 MG tablet Take 1 tablet (0.625 mg total) by mouth daily. 05/23/16   Allie Bossier, MD  Fe Fum-FePoly-Vit C-Vit B3 (INTEGRA PO) Take 1 capsule by mouth 2 (two) times daily. Supplement    Historical Provider, MD  fluticasone (FLONASE) 50 MCG/ACT nasal spray Place 2 sprays into both nostrils daily as needed for allergies or rhinitis. 05/23/16   Allie Bossier, MD  Glucosamine-Fish Oil-EPA-DHA (GLUCOSAMINE & FISH OIL PO) Take 1,000 mg by mouth 2 (two) times daily.     Historical Provider, MD  insulin aspart (NOVOLOG) 100 UNIT/ML injection Inject 8 Units into the skin 3 (three) times daily with meals. 05/23/16   Allie Bossier, MD  insulin detemir (LEVEMIR) 100 UNIT/ML injection Inject 0.06 mLs (6 Units total) into the skin daily. 05/24/16   Allie Bossier, MD  ipratropium-albuterol (DUONEB) 0.5-2.5 (3) MG/3ML SOLN Take 3 mLs by nebulization every 6 (six) hours as needed. 05/23/16   Allie Bossier, MD  leflunomide (ARAVA) 20 MG tablet Take 1 tablet (20 mg total) by mouth daily. 05/23/16   Allie Bossier, MD  levalbuterol [facility] HFA) 45 MCG/ACT inhaler Inhale 2 puffs into the lungs every 4 (four) hours as needed for wheezing (wheezing). 05/23/16   Allie Bossier, MD  lisinopril (PRINIVIL,ZESTRIL) 5 MG tablet Take 1 tablet (5 mg total) by mouth daily. 05/24/16   Allie Bossier, MD  loperamide (IMODIUM) 2 MG capsule Take 1 capsule (2 mg total) by mouth as needed for diarrhea or loose stools. 05/23/16   Allie Bossier, MD  LORazepam (ATIVAN) 0.5 MG tablet Take 1 tablet (0.5 mg total) by mouth every 4 (four) hours as needed for anxiety. 05/23/16   Allie Bossier, MD  Menthol-Methyl Salicylate (MUSCLE RUB) 10-15 % CREA Apply 1 application topically daily. 05/24/16   Allie Bossier, MD  metoprolol (LOPRESSOR) 100 MG tablet Take 1 tablet (100 mg total) by mouth 2 (two) times daily. 05/23/16   Allie Bossier, MD  Multiple Vitamin (MULTIVITAMINS PO) Take 1 tablet by mouth.  Daily.     Historical Provider, MD  Nutritional Supplements (ESTROVEN PO) Take 1 capsule by mouth daily.    Historical Provider, MD  polyvinyl alcohol (LIQUIFILM TEARS) 1.4 % ophthalmic solution Place 1 drop into both eyes daily as needed (dry eyes). 05/23/16   Allie Bossier, MD  pravastatin (PRAVACHOL) 20 MG tablet Take 1 tablet (20 mg total) by mouth daily at 6 PM. 05/23/16   Allie Bossier, MD  predniSONE (DELTASONE) 10 MG tablet Take 1 tablet (10 mg total) by mouth daily. 05/24/16   Allie Bossier, MD  shark liver oil-cocoa butter (PREPARATION H) 0.25-3-85.5 % suppository Place 1 suppository rectally 2 (two) times daily. Take 1 suppository BID x1 week, ending 02/17/16, then decrease to once qd for hemorrhoids 02/10/16   Historical Provider, MD  simethicone (MYLICON) 40 [id number] drops Take 1.2 mLs (80 mg total) by mouth 4 (four) times daily. 05/23/16   Allie Bossier, MD  vitamin C (ASCORBIC ACID) 500 MG tablet Take 500 mg by mouth 2 (two) times daily.     Historical Provider, MD  VITAMIN K, PHYTONADIONE, PO Take 100 mcg by mouth daily. Supplement    Historical Provider, MD  XIIDRA 5 % SOLN Place 1 drop into both eyes 2 (two) times daily as needed (dry eyes).  12/02/15   Historical Provider, MD    Physical Exam:  Vitals:   05/31/16 1556 05/31/16 1626 05/31/16 1700 05/31/16 1732  BP:  173/71 166/80 175/62  Pulse:  110 103 100  Resp:  26 (!) 28 26  Temp: 101.8 F (38.8 C)     TempSrc: Rectal     SpO2:  100% 99% 100%  Weight:      Height:       Constitutional: NAD, calm, Lethargic but easy to arouse. Eyes: PERRL, lids and conjunctivae normal ENMT: Mucous membranes are moist. Posterior pharynx clear of any exudate or lesions.Normal dentition.  Neck: normal, supple, no masses, no thyromegaly Respiratory: clear to auscultation bilaterally, no wheezing, no crackles. Normal respiratory effort. No accessory muscle use.  Cardiovascular: Regular rate and rhythm, no murmurs / rubs / gallops. No extremity  edema. 2+ pedal pulses. No carotid bruits.  Abdomen: no tenderness, no masses palpated. No hepatosplenomegaly. Bowel sounds positive.  Musculoskeletal: no clubbing / cyanosis. No joint deformity upper and lower extremities. Good ROM, no contractures. Normal muscle tone.  Skin: no rashes, lesions, ulcers. No  induration Neurologic: CN 2-12 grossly intact. Sensation intact, DTR normal. Strength 5/5 in all 4.  Psychiatric: Normal judgment and insight. Alert and oriented x 3. Normal mood.   Labs on Admission: I have personally reviewed following labs and imaging studies  CBC:  Recent Labs Lab 05/31/16 1505  WBC 14.2*  HGB 8.4*  HCT 23.2*  MCV 82.0  PLT [id number]   Basic Metabolic Panel:  Recent Labs Lab 05/31/16 1505  NA 115*  K 5.3*  CL 78*  CO2 25  GLUCOSE 397*  BUN 38*  CREATININE 1.79*  CALCIUM 8.3*   GFR: Estimated Creatinine Clearance: 18.5 mL/min (by C-G formula based on SCr of 1.79 mg/dL (H)). Liver Function Tests:  Recent Labs Lab 05/31/16 1505  AST 23  ALT 18  ALKPHOS 105  BILITOT 0.7  PROT 5.8*  ALBUMIN 3.2*   No results for input(s): LIPASE, AMYLASE in the last 168 hours. No results for input(s): AMMONIA in the last 168 hours. Coagulation Profile: No results for input(s): INR, PROTIME in the last 168 hours. Cardiac Enzymes: No results for input(s): CKTOTAL, CKMB, CKMBINDEX, TROPONINI in the last 168 hours. BNP (last 3 results) No results for input(s): PROBNP in the last 8760 hours. HbA1C: No results for input(s): HGBA1C in the last 72 hours. CBG:  Recent Labs Lab 05/31/16 1500  GLUCAP 390*   Lipid Profile: No results for input(s): CHOL, HDL, LDLCALC, TRIG, CHOLHDL, LDLDIRECT in the last 72 hours. Thyroid Function Tests: No results for input(s): TSH, T4TOTAL, FREET4, T3FREE, THYROIDAB in the last 72 hours. Anemia Panel: No results for input(s): VITAMINB12, FOLATE, FERRITIN, TIBC, IRON, RETICCTPCT in the last 72 hours. Urine analysis:     Component Value Date/Time   COLORURINE YELLOW 05/14/2016 [place] 05/14/2016 1359   LABSPEC 1.012 05/14/2016 1359   PHURINE 5.0 05/14/2016 1359   GLUCOSEU >=500 (A) 05/14/2016 1359   HGBUR NEGATIVE 05/14/2016 1359   BILIRUBINUR NEGATIVE 05/14/2016 1359   KETONESUR 5 (A) 05/14/2016 1359   PROTEINUR 100 (A) 05/14/2016 1359   UROBILINOGEN 0.2 05/28/2014 1021   NITRITE NEGATIVE 05/14/2016 1359   LEUKOCYTESUR NEGATIVE 05/14/2016 1359   Sepsis Labs: !!!!!!!!!!!!!!!!!!!!!!!!!!!!!!!!!!!!!!!!!!!! Invalid input(s): PROCALCITONIN, LACTICIDVEN No results found for this or any previous visit (from the past 240 hour(s)).   Radiological Exams on Admission: Ct Head Wo Contrast  Result Date: 05/31/2016 CLINICAL DATA:  76 y/o  F; altered mental status. EXAM: CT HEAD WITHOUT CONTRAST TECHNIQUE: Contiguous axial images were obtained from the base of the skull through the vertex without intravenous contrast. COMPARISON:  05/14/2016 MRI of the brain. 05/14/2016 CT of the head. FINDINGS: Brain: No evidence of acute infarction, hemorrhage, hydrocephalus, extra-axial collection or mass lesion/mass effect. Stable small chronic infarcts in right frontal white matter and the left cerebellar hemisphere. Stable moderate chronic microvascular ischemic changes and mild parenchymal volume loss of the brain. Vascular: Calcific atherosclerosis of cavernous and paraclinoid internal carotid arteries. Skull: Normal. Negative for fracture or focal lesion. Sinuses/Orbits: No acute finding. Other: Bilateral intra-ocular lens replacement. IMPRESSION: 1. No acute intracranial abnormality is identified. 2. Stable moderate chronic microvascular ischemic changes and mild parenchymal volume loss of the brain. Stable chronic infarcts in right frontal white matter and left cerebellar hemisphere. Electronically Signed   By: Kristine Garbe M.D.   On: 05/31/2016 15:49   Dg Chest Port 1 View  Result Date:  05/31/2016 CLINICAL DATA:  Altered mental status EXAM: PORTABLE CHEST 1 VIEW COMPARISON:  01/19/2016 FINDINGS: Heart size normal. Ectasia  of the thoracic aorta. Negative for heart failure. Negative for pneumonia or effusion. Chronic right-sided rib fractures. Apical scarring bilaterally. Chronic shoulder arthropathy bilaterally. IMPRESSION: No active disease. Electronically Signed   By: Franchot Gallo M.D.   On: 05/31/2016 15:38    EKG: Independently reviewed.   Assessment/Plan Principal Problem:   Sepsis ([place]) Active Problems:   Rheumatoid arthritis ([place])   Stroke ([place])   Uncontrolled type 2 diabetes mellitus with complication (HCC)   Hyponatremia   Acute renal failure (ARF) ([place])   Current chronic use of systemic steroids    Sepsis Presented with temp of 101.8, RR of 28 and suspicion of infection. There is evidence of hypoperfusion with lactic acid of 4.12. Started on aggressive IV fluid hydration and IV antibiotics, will continue.  Severe hyponatremia Sodium is 115, corrects to 122 for glucose of 397. Patient is started on normal saline at 75 mL per hour, check sodium every 4 hours for the first 24 hours. The rate correction based on sodium of 122, intended rate not to increase 0.5 mEq/hour.  Acute renal failure Creatinine is 1.79, based on BUN/cr ratio which is 22 this is appears to be secondary to dehydration/volume depletion. Patient given 1.5 L of normal saline as part of the sepsis protocol, continue and is at 75 mL/hour. Follow renal function closely.  Recent stroke She is on Plavix and statins, she is lethargic cannot be given for now. Restart as soon as she is able to tolerate oral medications.  Current use of systemic steroids/rheumatoid arthritis Patient is on 10 mg of prednisone at home for rheumatoid arthritis. Does not not having low BP for now but her sodium is down in potassium is up, placed on higher dose of steroid.  Hyperglycemia, insulin-dependent  diabetes mellitus Presented with glucose of 397, does not have any evidence of ketosis or acidosis. Restarted on Lantus 5 units at night and sensitive SSI.   DVT prophylaxis: SQ Heparin Code Status: DNR/DNI Family Communication: Plan D/W daughter at bedside. Disposition Plan: Home Consults called:  Admission status: Dell Ponto A MD [facility] Pager [phone]  If 7PM-7AM, please contact night-coverage [email or web address] Password [facility]  05/31/2016, 5:41 PM

## 2016-05-31 NOTE — ED Triage Notes (Signed)
Per EMS pt from [facility] for evaluation of hyperglycemia. CBG 496 en route with EMS; given 450 ml normal saline bolus.

## 2016-05-31 NOTE — ED Notes (Signed)
DG at bedside. [place] to insert 2nd IV when procedure completion.

## 2016-05-31 NOTE — Progress Notes (Signed)
Pharmacy Antibiotic Note  Marisa [name] is a 76 y.o. female admitted on 05/31/2016 with sepsis.  Pharmacy has been consulted for Vancomycin and Cefepime dosing. Noted penicillin allergy, but patient has tolerated a cephalosporin (Ceftriaxone) in the past-discussed with Dr. Ralene Bathe, who is okay to proceed with close monitoring. RN made aware to watch for reaction.   Plan: Vancomycin 1g IV x 1, then 500mg  IV q48h. Plan for Vancomycin trough level at steady state. Goal trough level 15-20 mcg/mL. Cefepime 2g IV x 1 in the ED, then 1g IV q24h. Monitor renal function, cultures, clinical course .  Height: 4\' 11"  (149.9 cm) Weight: 100 lb (45.4 kg) IBW/kg (Calculated) : 43.2  Temp (24hrs), Avg:100.3 F (37.9 C), Min:98.8 F (37.1 C), Max:101.8 F (38.8 C)   Recent Labs Lab 05/31/16 1505 05/31/16 1534  WBC 14.2*  --   CREATININE 1.79*  --   LATICACIDVEN  --  4.12*    Estimated Creatinine Clearance: 18.5 mL/min (by C-G formula based on SCr of 1.79 mg/dL (H)).    Allergies  Allergen Reactions  . Demerol [Meperidine] Other (See Comments)    Hallucinations  . Penicillins Swelling and Other (See Comments)    Throat swelling Has patient had a PCN reaction causing immediate rash, facial/tongue/throat swelling, SOB or lightheadedness with hypotension: Yes Has patient had a PCN reaction causing severe rash involving mucus membranes or skin necrosis: No Has patient had a PCN reaction that required hospitalization No Has patient had a PCN reaction occurring within the last 10 years: No If all of the above answers are "NO", then may proceed with Cephalosporin use.   . Enbrel [Etanercept] Swelling    Arm swelling   . Humira [Adalimumab] Swelling    Arm swelling     Antimicrobials this admission: 2/16 >> Vancomycin >> 2/16 >> Cefepime >>  Dose adjustments this admission: --  Microbiology results: 2/16 BCx: sent 2/16 UCx: sent    Thank you for allowing pharmacy to be a part of  this patient's care.   Lindell Spar, PharmD, BCPS Pager: [phone] 05/31/2016 4:10 PM

## 2016-05-31 NOTE — ED Notes (Signed)
Unable to assess neuro related to be not following commands. Pt able to state name but inappropriately answering yes and no to staff.

## 2016-05-31 NOTE — ED Notes (Signed)
Bed: WA07 Expected date:  Expected time:  Means of arrival:  Comments: EMS 

## 2016-05-31 NOTE — ED Notes (Signed)
Only able to obtain one blood culture at present time.

## 2016-06-01 LAB — BASIC METABOLIC PANEL
ANION GAP: 12 (ref 5–15)
ANION GAP: 11 (ref 5–15)
ANION GAP: 7 (ref 5–15)
Anion gap: 10 (ref 5–15)
Anion gap: 6 (ref 5–15)
Anion gap: 9 (ref 5–15)
BUN: 32 mg/dL — ABNORMAL HIGH (ref 6–20)
BUN: 29 mg/dL — ABNORMAL HIGH (ref 6–20)
BUN: 27 mg/dL — ABNORMAL HIGH (ref 6–20)
BUN: 27 mg/dL — AB (ref 6–20)
BUN: 25 mg/dL — AB (ref 6–20)
BUN: 24 mg/dL — AB (ref 6–20)
CALCIUM: 7.4 mg/dL — AB (ref 8.9–10.3)
CALCIUM: 7.6 mg/dL — AB (ref 8.9–10.3)
CALCIUM: 7.7 mg/dL — AB (ref 8.9–10.3)
CALCIUM: 7.6 mg/dL — AB (ref 8.9–10.3)
CHLORIDE: 85 mmol/L — AB (ref 101–111)
CO2: 20 mmol/L — ABNORMAL LOW (ref 22–32)
CO2: 21 mmol/L — ABNORMAL LOW (ref 22–32)
CO2: 20 mmol/L — AB (ref 22–32)
CO2: 25 mmol/L (ref 22–32)
CO2: 24 mmol/L (ref 22–32)
CO2: 25 mmol/L (ref 22–32)
CREATININE: 1.58 mg/dL — AB (ref 0.44–1.00)
CREATININE: 1.34 mg/dL — AB (ref 0.44–1.00)
CREATININE: 1.25 mg/dL — AB (ref 0.44–1.00)
CREATININE: 1.17 mg/dL — AB (ref 0.44–1.00)
Calcium: 7 mg/dL — ABNORMAL LOW (ref 8.9–10.3)
Calcium: 7.5 mg/dL — ABNORMAL LOW (ref 8.9–10.3)
Chloride: 90 mmol/L — ABNORMAL LOW (ref 101–111)
Chloride: 92 mmol/L — ABNORMAL LOW (ref 101–111)
Chloride: 94 mmol/L — ABNORMAL LOW (ref 101–111)
Chloride: 93 mmol/L — ABNORMAL LOW (ref 101–111)
Chloride: 95 mmol/L — ABNORMAL LOW (ref 101–111)
Creatinine, Ser: 1.55 mg/dL — ABNORMAL HIGH (ref 0.44–1.00)
Creatinine, Ser: 1.45 mg/dL — ABNORMAL HIGH (ref 0.44–1.00)
GFR calc Af Amer: 44 mL/min — ABNORMAL LOW (ref >60)
GFR calc Af Amer: 48 mL/min — ABNORMAL LOW (ref >60)
GFR calc Af Amer: 51 mL/min — ABNORMAL LOW (ref >60)
GFR calc non Af Amer: 32 mL/min — ABNORMAL LOW (ref >60)
GFR calc non Af Amer: 34 mL/min — ABNORMAL LOW (ref >60)
GFR calc non Af Amer: 44 mL/min — ABNORMAL LOW (ref >60)
GFR, EST AFRICAN AMERICAN: 37 mL/min — AB (ref >60)
GFR, EST AFRICAN AMERICAN: 40 mL/min — AB (ref >60)
GFR, EST AFRICAN AMERICAN: 36 mL/min — AB (ref >60)
GFR, EST NON AFRICAN AMERICAN: 31 mL/min — AB (ref >60)
GFR, EST NON AFRICAN AMERICAN: 38 mL/min — AB (ref >60)
GFR, EST NON AFRICAN AMERICAN: 41 mL/min — AB (ref >60)
GLUCOSE: 433 mg/dL — AB (ref 65–99)
GLUCOSE: 112 mg/dL — AB (ref 65–99)
GLUCOSE: 109 mg/dL — AB (ref 65–99)
GLUCOSE: 168 mg/dL — AB (ref 65–99)
Glucose, Bld: 488 mg/dL — ABNORMAL HIGH (ref 65–99)
Glucose, Bld: 471 mg/dL — ABNORMAL HIGH (ref 65–99)
POTASSIUM: 5.8 mmol/L — AB (ref 3.5–5.1)
POTASSIUM: 5.3 mmol/L — AB (ref 3.5–5.1)
Potassium: 4.5 mmol/L (ref 3.5–5.1)
Potassium: 4.3 mmol/L (ref 3.5–5.1)
Potassium: 4.5 mmol/L (ref 3.5–5.1)
Potassium: 5.1 mmol/L (ref 3.5–5.1)
SODIUM: 117 mmol/L — AB (ref 135–145)
SODIUM: 121 mmol/L — AB (ref 135–145)
Sodium: 123 mmol/L — ABNORMAL LOW (ref 135–145)
Sodium: 125 mmol/L — ABNORMAL LOW (ref 135–145)
Sodium: 126 mmol/L — ABNORMAL LOW (ref 135–145)
Sodium: 127 mmol/L — ABNORMAL LOW (ref 135–145)

## 2016-06-01 LAB — HEMOGLOBIN A1C
Hgb A1c MFr Bld: 6.8 % — ABNORMAL HIGH (ref 4.8–5.6)
Mean Plasma Glucose: 148 mg/dL

## 2016-06-01 LAB — URINALYSIS, ROUTINE W REFLEX MICROSCOPIC
BILIRUBIN URINE: NEGATIVE
Hgb urine dipstick: NEGATIVE
Ketones, ur: 20 mg/dL — AB
LEUKOCYTES UA: NEGATIVE
Nitrite: NEGATIVE
PH: 5 (ref 5.0–8.0)
Protein, ur: NEGATIVE mg/dL
SPECIFIC GRAVITY, URINE: 1.007 (ref 1.005–1.030)
Squamous Epithelial / LPF: NONE SEEN

## 2016-06-01 LAB — GLUCOSE, CAPILLARY
GLUCOSE-CAPILLARY: 452 mg/dL — AB (ref 65–99)
GLUCOSE-CAPILLARY: 33 mg/dL — AB (ref 65–99)
GLUCOSE-CAPILLARY: 165 mg/dL — AB (ref 65–99)
Glucose-Capillary: 364 mg/dL — ABNORMAL HIGH (ref 65–99)
Glucose-Capillary: 83 mg/dL (ref 65–99)
Glucose-Capillary: 148 mg/dL — ABNORMAL HIGH (ref 65–99)

## 2016-06-01 LAB — CBC
HCT: 22.1 % — ABNORMAL LOW (ref 36.0–46.0)
HEMOGLOBIN: 7.6 g/dL — AB (ref 12.0–15.0)
MCH: 28.9 pg (ref 26.0–34.0)
MCHC: 34.4 g/dL (ref 30.0–36.0)
MCV: 84 fL (ref 78.0–100.0)
Platelets: 273 10*3/uL (ref 150–400)
RBC: 2.63 MIL/uL — ABNORMAL LOW (ref 3.87–5.11)
RDW: 13.7 % (ref 11.5–15.5)
WBC: 15.2 10*3/uL — ABNORMAL HIGH (ref 4.0–10.5)

## 2016-06-01 LAB — OSMOLALITY, URINE: Osmolality, Ur: 260 mOsm/kg — ABNORMAL LOW (ref 300–900)

## 2016-06-01 MED ORDER — LIP MEDEX EX OINT
TOPICAL_OINTMENT | CUTANEOUS | Status: AC
  Administered 2016-06-01: 13:00:00
  Filled 2016-06-01: qty 7

## 2016-06-01 MED ORDER — INSULIN ASPART 100 UNIT/ML ~~LOC~~ SOLN
10.0000 [IU] | Freq: Once | SUBCUTANEOUS | Status: AC
  Administered 2016-06-01: 10 [IU] via SUBCUTANEOUS

## 2016-06-01 MED ORDER — CLOPIDOGREL BISULFATE 75 MG PO TABS
75.0000 mg | ORAL_TABLET | Freq: Every day | ORAL | Status: DC
  Administered 2016-06-01 – 2016-06-04 (×4): 75 mg via ORAL
  Filled 2016-06-01 (×4): qty 1

## 2016-06-01 MED ORDER — AMLODIPINE BESYLATE 5 MG PO TABS
5.0000 mg | ORAL_TABLET | Freq: Every day | ORAL | Status: DC
  Administered 2016-06-01 – 2016-06-04 (×4): 5 mg via ORAL
  Filled 2016-06-01 (×4): qty 1

## 2016-06-01 MED ORDER — PREGABALIN 75 MG PO CAPS
75.0000 mg | ORAL_CAPSULE | Freq: Every day | ORAL | Status: DC
  Administered 2016-06-01 – 2016-06-03 (×3): 75 mg via ORAL
  Filled 2016-06-01 (×3): qty 1

## 2016-06-01 MED ORDER — INSULIN ASPART 100 UNIT/ML ~~LOC~~ SOLN
3.0000 [IU] | Freq: Three times a day (TID) | SUBCUTANEOUS | Status: DC
  Administered 2016-06-02: 3 [IU] via SUBCUTANEOUS

## 2016-06-01 NOTE — Progress Notes (Signed)
Pt had hypoglycemic event reading 33 right before she was about to eat dinner.  Had her drink 215mL of soda then let her eat dinner.  Rechecked glucose and it was 83.   Will inform Dr. Hartford Poli.  Irven [place], RN

## 2016-06-01 NOTE — Significant Event (Signed)
Called by RN that pt had a hypoglycemic episode, she was symptomatic and her CBG was 33 @ 1712 Hrs. Ate half of her dinner and she had a soda CBG is 83 afterwards. According to documentation she received 10+9+9 units of Novolog, No meals documented. (Per RN she was eating half of her meals). Continue 5 units of Lantus. Continue IVF and CBG checks, DC sliding scale, give 3 units of Novolog with meals   Birdie Hopes Pager: [phone] 06/01/2016, 6:23 PM

## 2016-06-01 NOTE — Progress Notes (Signed)
PROGRESS NOTE  Marisa [name]  [id number] DOB: [birth year] DOA: 05/31/2016 PCP: Jani Gravel, MD Outpatient Specialists:  Subjective: Sleepy but easy to arouse then she was conversant.  Brief Narrative:  Marisa [name] is a 76 y.o. female with medical history significant of recent stroke, discharge from [facility] to nursing home on 05/23/2016 after she was treated for stroke. Patient is lethargic, most of the history obtained from the EDP notes and daughter at bedside. Her daughter mentioned she was doing okay after she was discharged, was able to get up with the help of personnel and walker and actually woke all the way to the bathroom. For the past couple of days she started to be more lethargic, today she developed fever and her blood sugar was high so they brought her to the hospital for further evaluation. She was found to have sodium of 115, acute renal failure and fever of 101.8.  Assessment & Plan:   Principal Problem:   Sepsis ([place]) Active Problems:   Rheumatoid arthritis ([place])   Stroke ([place])   Uncontrolled type 2 diabetes mellitus with complication (HCC)   Hyponatremia   Acute renal failure (ARF) ([place])   Current chronic use of systemic steroids   Sepsis Presented with temp of 101.8, RR of 28 and suspicion of infection. Lactic acid was 4.1, improved to 0.9 after IV fluid hydration. Continue IV fluid hydration and IV antibiotics.  Severe hyponatremia Sodium is 115, corrects to 122 for glucose of 397. Patient is started on normal saline at 75 mL per hour, check sodium every 4 hours for the first 24 hours. Sodium is 121 (corrected is 130) continue normal saline, I appreciate Dr. Bishop [place] help.  Hyperglycemia, insulin-dependent diabetes mellitus Presented with glucose of 397, does not have any evidence of ketosis or acidosis. Restarted on Lantus 5 units at night and sensitive SSI. Blood glucoses 450 this morning, 10 units of NovoLog to be given, if continues  to be elevated will start insulin drip.  Acute renal failure Creatinine is 1.79, based on BUN/cr ratio which is 22 this is appears to be secondary to dehydration/volume depletion. Patient given 1.5 L of normal saline as part of the sepsis protocol, continue and is at 75 mL/hour. Creatinine improved slightly to 1.45, continue hydration check BMP in a.m.  Recent stroke Is on Plavix, on hold because she was lethargic, restart today if she can tolerate it.  Current use of systemic steroids/rheumatoid arthritis Patient is on 10 mg of prednisone at home for rheumatoid arthritis. Although there was no low blood pressure low sodium and high potassium fits the picture of hypoadrenalism. Patient placed on Solu-Cortef 50 mg twice a day, we will taper off as tolerated.  Hypertension Blood pressure medications held except metoprolol to avoid rebound tachycardia. Restart blood pressure medications gradually.   DVT prophylaxis: Subcutaneous heparin Code Status: DNR Family Communication:  Disposition Plan: Continue to keep the stepdown, needs frequent sodium checks, still critical. Diet: Diet NPO time specified  Consultants:   Nephrology  Procedures:   None  Antimicrobials:   None   Objective: Vitals:   06/01/16 0400 06/01/16 0500 06/01/16 0600 06/01/16 0800  BP: (!) 139/33 (!) 149/52 (!) 146/47   Pulse: 85 82 81   Resp: (!) 21 (!) 22 20   Temp: 97.9 F (36.6 C)   98.5 F (36.9 C)  TempSrc: Axillary   Oral  SpO2: 99% 99% 100%   Weight:      Height:  Intake/Output Summary (Last 24 hours) at 06/01/16 0941 Last data filed at 06/01/16 0600  Gross per 24 hour  Intake          3115.25 ml  Output              850 ml  Net          2265.25 ml   Filed Weights   05/31/16 1445 05/31/16 1826  Weight: 45.4 kg (100 lb) 49.8 kg (109 lb 12.6 oz)    Examination: General exam: Appears calm and comfortable  Respiratory system: Clear to auscultation. Respiratory effort  normal. Cardiovascular system: S1 & S2 heard, RRR. No JVD, murmurs, rubs, gallops or clicks. No pedal edema. Gastrointestinal system: Abdomen is nondistended, soft and nontender. No organomegaly or masses felt. Normal bowel sounds heard. Central nervous system: Alert and oriented. No focal neurological deficits. Extremities: Symmetric 5 x 5 power. Skin: No rashes, lesions or ulcers Psychiatry: Judgement and insight appear normal. Mood & affect appropriate.   Data Reviewed: I have personally reviewed following labs and imaging studies  CBC:  Recent Labs Lab 05/31/16 1505 06/01/16 0740  WBC 14.2* 15.2*  HGB 8.4* 7.6*  HCT 23.2* 22.1*  MCV 82.0 84.0  PLT [id number]   Basic Metabolic Panel:  Recent Labs Lab 05/31/16 1505 05/31/16 1927 05/31/16 2317 06/01/16 0238 06/01/16 0740  NA 115* 115* 117* 117* 121*  K 5.3* 5.7* 6.2* 5.8* 5.3*  CL 78* 82* 84* 85* 90*  CO2 25 23 19* 20* 21*  GLUCOSE 397* 410* 491* 488* 471*  BUN 38* 34* 31* 32* 29*  CREATININE 1.79* 1.61* 1.47* 1.55* 1.45*  CALCIUM 8.3* 7.4* 7.2* 7.0* 7.5*   GFR: Estimated Creatinine Clearance: 22.9 mL/min (by C-G formula based on SCr of 1.45 mg/dL (H)). Liver Function Tests:  Recent Labs Lab 05/31/16 1505  AST 23  ALT 18  ALKPHOS 105  BILITOT 0.7  PROT 5.8*  ALBUMIN 3.2*   No results for input(s): LIPASE, AMYLASE in the last 168 hours. No results for input(s): AMMONIA in the last 168 hours. Coagulation Profile:  Recent Labs Lab 05/31/16 1927  INR 0.92   Cardiac Enzymes: No results for input(s): CKTOTAL, CKMB, CKMBINDEX, TROPONINI in the last 168 hours. BNP (last 3 results) No results for input(s): PROBNP in the last 8760 hours. HbA1C:  Recent Labs  05/31/16 1927  HGBA1C 6.8*   CBG:  Recent Labs Lab 05/31/16 1500 05/31/16 2042 06/01/16 0812  GLUCAP 390* 461* 452*   Lipid Profile: No results for input(s): CHOL, HDL, LDLCALC, TRIG, CHOLHDL, LDLDIRECT in the last 72 hours. Thyroid  Function Tests:  Recent Labs  05/31/16 1927  TSH 0.861   Anemia Panel: No results for input(s): VITAMINB12, FOLATE, FERRITIN, TIBC, IRON, RETICCTPCT in the last 72 hours. Urine analysis:    Component Value Date/Time   COLORURINE STRAW (A) 06/01/2016 0336   APPEARANCEUR CLEAR 06/01/2016 0336   LABSPEC 1.007 06/01/2016 0336   PHURINE 5.0 06/01/2016 0336   GLUCOSEU >=500 (A) 06/01/2016 0336   HGBUR NEGATIVE 06/01/2016 0336   BILIRUBINUR NEGATIVE 06/01/2016 0336   KETONESUR 20 (A) 06/01/2016 0336   PROTEINUR NEGATIVE 06/01/2016 0336   UROBILINOGEN 0.2 05/28/2014 1021   NITRITE NEGATIVE 06/01/2016 0336   LEUKOCYTESUR NEGATIVE 06/01/2016 0336   Sepsis Labs: @LABRCNTIP (procalcitonin:4,lacticidven:4)  ) Recent Results (from the past 240 hour(s))  MRSA PCR Screening     Status: None   Collection Time: 05/31/16  6:26 PM  Result Value Ref Range Status  MRSA by PCR NEGATIVE NEGATIVE Final    Comment:        The GeneXpert MRSA Assay (FDA approved for NASAL specimens only), is one component of a comprehensive MRSA colonization surveillance program. It is not intended to diagnose MRSA infection nor to guide or monitor treatment for MRSA infections.      Invalid input(s): PROCALCITONIN, LACTICACIDVEN   Radiology Studies: Ct Head Wo Contrast  Result Date: 05/31/2016 CLINICAL DATA:  76 y/o  F; altered mental status. EXAM: CT HEAD WITHOUT CONTRAST TECHNIQUE: Contiguous axial images were obtained from the base of the skull through the vertex without intravenous contrast. COMPARISON:  05/14/2016 MRI of the brain. 05/14/2016 CT of the head. FINDINGS: Brain: No evidence of acute infarction, hemorrhage, hydrocephalus, extra-axial collection or mass lesion/mass effect. Stable small chronic infarcts in right frontal white matter and the left cerebellar hemisphere. Stable moderate chronic microvascular ischemic changes and mild parenchymal volume loss of the brain. Vascular: Calcific  atherosclerosis of cavernous and paraclinoid internal carotid arteries. Skull: Normal. Negative for fracture or focal lesion. Sinuses/Orbits: No acute finding. Other: Bilateral intra-ocular lens replacement. IMPRESSION: 1. No acute intracranial abnormality is identified. 2. Stable moderate chronic microvascular ischemic changes and mild parenchymal volume loss of the brain. Stable chronic infarcts in right frontal white matter and left cerebellar hemisphere. Electronically Signed   By: Kristine Garbe M.D.   On: 05/31/2016 15:49   Dg Chest Port 1 View  Result Date: 05/31/2016 CLINICAL DATA:  Altered mental status EXAM: PORTABLE CHEST 1 VIEW COMPARISON:  01/19/2016 FINDINGS: Heart size normal. Ectasia of the thoracic aorta. Negative for heart failure. Negative for pneumonia or effusion. Chronic right-sided rib fractures. Apical scarring bilaterally. Chronic shoulder arthropathy bilaterally. IMPRESSION: No active disease. Electronically Signed   By: Franchot Gallo M.D.   On: 05/31/2016 15:38        Scheduled Meds: . ceFEPime (MAXIPIME) IV  1 g Intravenous Q24H  . heparin  5,000 Units Subcutaneous Q8H  . hydrocortisone sod succinate (SOLU-CORTEF) inj  50 mg Intravenous Q12H  . insulin aspart  0-9 Units Subcutaneous TID WC  . insulin aspart  10 Units Subcutaneous Once  . insulin glargine  5 Units Subcutaneous QHS  . metoprolol  100 mg Oral BID  . sodium chloride flush  3 mL Intravenous Q12H  . [START ON 06/02/2016] vancomycin  500 mg Intravenous Q48H   Continuous Infusions: . sodium chloride 75 mL/hr at 05/31/16 1843     LOS: 1 day    Time spent: 35 minutes    Kenlee Maler A, MD [facility] Pager [phone]  If 7PM-7AM, please contact night-coverage [email or web address] Password [facility] 06/01/2016, 9:41 AM

## 2016-06-01 NOTE — Progress Notes (Signed)
[facility] Progress Note    Assessment/ Plan:   1.  Hyponatremia: Hypotonic hyponatremia.   Improved with IVF.  Decreasing rate of IVF to 50 mL/ hr to avoid overcorrection  2.  Severe sepsis: urinary, pulmonary sources possible.  Blood culture obtained, empiric antibiotics, IVF as above, stress dose steroids  3.  Hyperkalemia: improving  4.  AKI: 2/2 sepsis and hypovolemia, improving  5.  Possible adrenal insufficiency- stress dose steroids, could partiall explain hyponatremia with concomitant hyperkalemia.  6.  Recent strokes: was in rehab; still has some deficits   Subjective:    Much better than yesterday.  MS greatly improved.     Objective:   BP (!) 117/36   Pulse 61   Temp 98.5 F (36.9 C) (Oral)   Resp (!) 24   Ht 4\' 11"  (1.499 m)   Wt 49.8 kg (109 lb 12.6 oz)   SpO2 100%   BMI 22.17 kg/m   Intake/Output Summary (Last 24 hours) at 06/01/16 1238 Last data filed at 06/01/16 1200  Gross per 24 hour  Intake          3565.25 ml  Output             1050 ml  Net          2515.25 ml   Weight change:   Physical Exam: GEN: much better appearing than yesterday HEENT +disconjugate gaze, sclerae anicteric, tacky MM NECK No JVD PULM tachypneic, clear  CV tachycardic, III/VI systolic murmur LUSB ABD soft, nontender EXT very trace edema  NEURO: alert and talkative MSK: +bilateral deformities characteristic of RA  Imaging: Ct Head Wo Contrast  Result Date: 05/31/2016 CLINICAL DATA:  76 y/o  F; altered mental status. EXAM: CT HEAD WITHOUT CONTRAST TECHNIQUE: Contiguous axial images were obtained from the base of the skull through the vertex without intravenous contrast. COMPARISON:  05/14/2016 MRI of the brain. 05/14/2016 CT of the head. FINDINGS: Brain: No evidence of acute infarction, hemorrhage, hydrocephalus, extra-axial collection or mass lesion/mass effect. Stable small chronic infarcts in right frontal white matter and the left cerebellar  hemisphere. Stable moderate chronic microvascular ischemic changes and mild parenchymal volume loss of the brain. Vascular: Calcific atherosclerosis of cavernous and paraclinoid internal carotid arteries. Skull: Normal. Negative for fracture or focal lesion. Sinuses/Orbits: No acute finding. Other: Bilateral intra-ocular lens replacement. IMPRESSION: 1. No acute intracranial abnormality is identified. 2. Stable moderate chronic microvascular ischemic changes and mild parenchymal volume loss of the brain. Stable chronic infarcts in right frontal white matter and left cerebellar hemisphere. Electronically Signed   By: Kristine Garbe M.D.   On: 05/31/2016 15:49   Dg Chest Port 1 View  Result Date: 05/31/2016 CLINICAL DATA:  Altered mental status EXAM: PORTABLE CHEST 1 VIEW COMPARISON:  01/19/2016 FINDINGS: Heart size normal. Ectasia of the thoracic aorta. Negative for heart failure. Negative for pneumonia or effusion. Chronic right-sided rib fractures. Apical scarring bilaterally. Chronic shoulder arthropathy bilaterally. IMPRESSION: No active disease. Electronically Signed   By: Franchot Gallo M.D.   On: 05/31/2016 15:38    Labs: BMET  Recent Labs Lab 05/31/16 1505 05/31/16 1927 05/31/16 2317 06/01/16 0238 06/01/16 0740 06/01/16 1137  NA 115* 115* 117* 117* 121* 123*  K 5.3* 5.7* 6.2* 5.8* 5.3* 4.5  CL 78* 82* 84* 85* 90* 92*  CO2 25 23 19* 20* 21* 20*  GLUCOSE 397* 410* 491* 488* 471* 433*  BUN 38* 34* 31* 32* 29* 27*  CREATININE 1.79* 1.61* 1.47* 1.55*  1.45* 1.58*  CALCIUM 8.3* 7.4* 7.2* 7.0* 7.5* 7.4*   CBC  Recent Labs Lab 05/31/16 1505 06/01/16 0740  WBC 14.2* 15.2*  HGB 8.4* 7.6*  HCT 23.2* 22.1*  MCV 82.0 84.0  PLT 361 273    Medications:    . amLODipine  5 mg Oral Daily  . ceFEPime (MAXIPIME) IV  1 g Intravenous Q24H  . clopidogrel  75 mg Oral Q breakfast  . heparin  5,000 Units Subcutaneous Q8H  . hydrocortisone sod succinate (SOLU-CORTEF) inj  50 mg  Intravenous Q12H  . insulin aspart  0-9 Units Subcutaneous TID WC  . insulin glargine  5 Units Subcutaneous QHS  . metoprolol  100 mg Oral BID  . sodium chloride flush  3 mL Intravenous Q12H  . [START ON 06/02/2016] vancomycin  500 mg Intravenous Q48H      Madelon Lips MD [facility] pgr [phone] 06/01/2016, 12:38 PM

## 2016-06-01 NOTE — Progress Notes (Signed)
Notified MD of elevated blood sugar of 461. No new orders given. Will continue to monitor.

## 2016-06-01 NOTE — Progress Notes (Signed)
CRITICAL VALUE ALERT  Critical value received:  Na:117  Date of notification: 05/31/16  Time of notification:  [id number]   Critical value read back:Yes.    Nurse who received alert:  Jerl Santos RN   MD notified (1st page):  Dr. Maudie Mercury  Time of first page:  [phone]  Responding MD: Maudie Mercury  Time MD responded: 0000

## 2016-06-02 LAB — CBC
HCT: 23.2 % — ABNORMAL LOW (ref 36.0–46.0)
Hemoglobin: 8.2 g/dL — ABNORMAL LOW (ref 12.0–15.0)
MCH: 29.6 pg (ref 26.0–34.0)
MCHC: 35.3 g/dL (ref 30.0–36.0)
MCV: 83.8 fL (ref 78.0–100.0)
PLATELETS: 310 10*3/uL (ref 150–400)
RBC: 2.77 MIL/uL — AB (ref 3.87–5.11)
RDW: 13.9 % (ref 11.5–15.5)
WBC: 12.7 10*3/uL — ABNORMAL HIGH (ref 4.0–10.5)

## 2016-06-02 LAB — GLUCOSE, CAPILLARY
GLUCOSE-CAPILLARY: 249 mg/dL — AB (ref 65–99)
Glucose-Capillary: 183 mg/dL — ABNORMAL HIGH (ref 65–99)
Glucose-Capillary: 210 mg/dL — ABNORMAL HIGH (ref 65–99)
Glucose-Capillary: 105 mg/dL — ABNORMAL HIGH (ref 65–99)

## 2016-06-02 LAB — BASIC METABOLIC PANEL
Anion gap: 6 (ref 5–15)
BUN: 23 mg/dL — ABNORMAL HIGH (ref 6–20)
CHLORIDE: 96 mmol/L — AB (ref 101–111)
CO2: 25 mmol/L (ref 22–32)
CREATININE: 1.24 mg/dL — AB (ref 0.44–1.00)
Calcium: 7.7 mg/dL — ABNORMAL LOW (ref 8.9–10.3)
GFR calc non Af Amer: 41 mL/min — ABNORMAL LOW (ref >60)
GFR, EST AFRICAN AMERICAN: 48 mL/min — AB (ref >60)
GLUCOSE: 180 mg/dL — AB (ref 65–99)
Potassium: 5.1 mmol/L (ref 3.5–5.1)
Sodium: 127 mmol/L — ABNORMAL LOW (ref 135–145)

## 2016-06-02 LAB — URINE CULTURE: CULTURE: NO GROWTH

## 2016-06-02 LAB — TROPONIN I

## 2016-06-02 MED ORDER — ASPIRIN EC 81 MG PO TBEC
81.0000 mg | DELAYED_RELEASE_TABLET | Freq: Every day | ORAL | Status: DC
  Administered 2016-06-02 – 2016-06-04 (×3): 81 mg via ORAL
  Filled 2016-06-02 (×3): qty 1

## 2016-06-02 MED ORDER — INSULIN ASPART 100 UNIT/ML ~~LOC~~ SOLN
4.0000 [IU] | Freq: Three times a day (TID) | SUBCUTANEOUS | Status: DC
  Administered 2016-06-02 – 2016-06-04 (×5): 4 [IU] via SUBCUTANEOUS

## 2016-06-02 MED ORDER — VANCOMYCIN HCL 500 MG IV SOLR
500.0000 mg | INTRAVENOUS | Status: DC
  Administered 2016-06-02 – 2016-06-03 (×2): 500 mg via INTRAVENOUS
  Filled 2016-06-02 (×3): qty 500

## 2016-06-02 MED ORDER — GI COCKTAIL ~~LOC~~
30.0000 mL | Freq: Once | ORAL | Status: AC
  Administered 2016-06-02: 30 mL via ORAL
  Filled 2016-06-02: qty 30

## 2016-06-02 MED ORDER — INSULIN GLARGINE 100 UNIT/ML ~~LOC~~ SOLN
6.0000 [IU] | Freq: Every day | SUBCUTANEOUS | Status: DC
  Administered 2016-06-02: 6 [IU] via SUBCUTANEOUS
  Filled 2016-06-02: qty 0.06

## 2016-06-02 MED ORDER — PREDNISONE 20 MG PO TABS
40.0000 mg | ORAL_TABLET | Freq: Every day | ORAL | Status: DC
  Administered 2016-06-03 – 2016-06-04 (×2): 40 mg via ORAL
  Filled 2016-06-02 (×2): qty 2

## 2016-06-02 NOTE — Progress Notes (Signed)
Pt c/o "bad chest pain".  Informed Dr. Hartford Poli.  EKG, Gi cocktail, and one troponin ordered.  Irven Baltimore, RN

## 2016-06-02 NOTE — Progress Notes (Addendum)
PROGRESS NOTE  Marisa [name]  [id number] DOB: 10/20/1940 Marisa [name]: 05/31/2016 PCP: Jani Gravel, MD Outpatient Specialists:  Subjective: More awake and alert today, denies any complaints, had a hypoglycemic episode with CBG of 33 last evening. Sodium is 127 this morning.  Brief Narrative:  ADALEN DILL is a 76 y.o. female with medical history significant of recent stroke, discharge from [facility] to nursing home on 05/23/2016 after she was treated for stroke. Patient is lethargic, most of the history obtained from the EDP notes and daughter at bedside. Her daughter mentioned she was doing okay after she was discharged, was able to get up with the help of personnel and walker and actually woke all the way to the bathroom. For the past couple of days she started to be more lethargic, today she developed fever and her blood sugar was high so they brought her to the hospital for further evaluation. She was found to have sodium of 115, acute renal failure and fever of 101.8.  Assessment & Plan:   Principal Problem:   Sepsis ([place]) Active Problems:   Rheumatoid arthritis ([place])   Stroke ([place])   Uncontrolled type 2 diabetes mellitus with complication (HCC)   Hyponatremia   Acute renal failure (ARF) ([place])   Current chronic use of systemic steroids   Sepsis Presented with temp of 101.8, RR of 28 and suspicion of infection. Lactic acid was 4.1, improved to 0.9 after IV fluid hydration. Continue IV fluid hydration and IV antibiotics. Sepsis physiology resolved.  Severe hyponatremia Sodium is 115, corrects to 122 for glucose of 397. Was on normal saline at 75 mL per hour, sodium every 4 hours in the first 24 hours. Sodium is 127 this morning, on 50 mL/hour of NS, still dehydrated, creatinine 1.24.   Hyperglycemia, insulin-dependent diabetes mellitus Presented with glucose of 397, does not have any evidence of ketosis or acidosis. Restarted on Lantus 5 units at night and  sensitive SSI. Developed a hypoglycemic episode as patient received 28 units of NovoLog 10+9+9. Adjust insulin regimen, increase Lantus to 6 units and prandial NovoLog to 4 units.  Acute renal failure Creatinine is 1.79, based on BUN/cr ratio which is 22 this is appears to be secondary to dehydration/volume depletion. Patient given 1.5 L of normal saline as part of the sepsis protocol, continue and is at 75 mL/hour. Creatinine continues to improve, 1.2 this morning, continue IV fluids.  Recent stroke Is on Plavix, on hold because she was lethargic, restart today if she can tolerate it.  Current use of systemic steroids/rheumatoid arthritis Patient is on 10 mg of prednisone at home for rheumatoid arthritis. Although there was no low blood pressure low sodium and high potassium fits the picture of hypoadrenalism. Patient placed on Solu-Cortef 50 mg twice a day, we will taper off as tolerated.  Hypertension Blood pressure medications held except metoprolol to avoid rebound tachycardia. Restart blood pressure medications gradually.   DVT prophylaxis: Subcutaneous heparin Code Status: DNR Family Communication:  Disposition Plan: Transfer to telemetry. Diet: DIET DYS 3 Room service appropriate? No; Fluid consistency: Thin  Consultants:   Nephrology  Procedures:   None  Antimicrobials:   None   Objective: Vitals:   06/01/16 2200 06/02/16 0000 06/02/16 0400 06/02/16 0800  BP: (!) 156/75 (!) 124/41 (!) 153/61 (!) 165/63  Pulse: 73 62 67 83  Resp: 17 16 17  (!) 26  Temp:  97.6 F (36.4 C)  97.5 F (36.4 C)  TempSrc:  Oral  Axillary  SpO2: 100% 100% 100% 100%  Weight:      Height:        Intake/Output Summary (Last 24 hours) at 06/02/16 0956 Last data filed at 06/02/16 0400  Gross per 24 hour  Intake             1105 ml  Output             1200 ml  Net              -95 ml   Filed Weights   05/31/16 1445 05/31/16 1826  Weight: 45.4 kg (100 lb) 49.8 kg (109 lb  12.6 oz)    Examination: General exam: Appears calm and comfortable  Respiratory system: Clear to auscultation. Respiratory effort normal. Cardiovascular system: S1 & S2 heard, RRR. No JVD, murmurs, rubs, gallops or clicks. No pedal edema. Gastrointestinal system: Abdomen is nondistended, soft and nontender. No organomegaly or masses felt. Normal bowel sounds heard. Central nervous system: Alert and oriented. No focal neurological deficits. Extremities: Symmetric 5 x 5 power. Skin: No rashes, lesions or ulcers Psychiatry: Judgement and insight appear normal. Mood & affect appropriate.   Data Reviewed: I have personally reviewed following labs and imaging studies  CBC:  Recent Labs Lab 05/31/16 1505 06/01/16 0740 06/02/16 0343  WBC 14.2* 15.2* 12.7*  HGB 8.4* 7.6* 8.2*  HCT 23.2* 22.1* 23.2*  MCV 82.0 84.0 83.8  PLT 361 273 [id number]   Basic Metabolic Panel:  Recent Labs Lab 06/01/16 1137 06/01/16 1539 06/01/16 1823 06/01/16 2254 06/02/16 0343  NA 123* 125* 126* 127* 127*  K 4.5 4.3 4.5 5.1 5.1  CL 92* 94* 93* 95* 96*  CO2 20* 25 24 25 25   GLUCOSE 433* 112* 109* 168* 180*  BUN 27* 27* 25* 24* 23*  CREATININE 1.58* 1.34* 1.25* 1.17* 1.24*  CALCIUM 7.4* 7.6* 7.7* 7.6* 7.7*   GFR: Estimated Creatinine Clearance: 26.7 mL/min (by C-G formula based on SCr of 1.24 mg/dL (H)). Liver Function Tests:  Recent Labs Lab 05/31/16 1505  AST 23  ALT 18  ALKPHOS 105  BILITOT 0.7  PROT 5.8*  ALBUMIN 3.2*   No results for input(s): LIPASE, AMYLASE in the last 168 hours. No results for input(s): AMMONIA in the last 168 hours. Coagulation Profile:  Recent Labs Lab 05/31/16 1927  INR 0.92   Cardiac Enzymes: No results for input(s): CKTOTAL, CKMB, CKMBINDEX, TROPONINI in the last 168 hours. BNP (last 3 results) No results for input(s): PROBNP in the last 8760 hours. HbA1C:  Recent Labs  05/31/16 1927  HGBA1C 6.8*   CBG:  Recent Labs Lab 06/01/16 1712  06/01/16 1756 06/01/16 2020 06/01/16 2146 06/02/16 0752  GLUCAP 33* 83 148* 165* 183*   Lipid Profile: No results for input(s): CHOL, HDL, LDLCALC, TRIG, CHOLHDL, LDLDIRECT in the last 72 hours. Thyroid Function Tests:  Recent Labs  05/31/16 1927  TSH 0.861   Anemia Panel: No results for input(s): VITAMINB12, FOLATE, FERRITIN, TIBC, IRON, RETICCTPCT in the last 72 hours. Urine analysis:    Component Value Date/Time   COLORURINE STRAW (A) 06/01/2016 0336   APPEARANCEUR CLEAR 06/01/2016 0336   LABSPEC 1.007 06/01/2016 0336   PHURINE 5.0 06/01/2016 0336   GLUCOSEU >=500 (A) 06/01/2016 0336   HGBUR NEGATIVE 06/01/2016 0336   BILIRUBINUR NEGATIVE 06/01/2016 0336   KETONESUR 20 (A) 06/01/2016 0336   PROTEINUR NEGATIVE 06/01/2016 0336   UROBILINOGEN 0.2 05/28/2014 1021   NITRITE NEGATIVE 06/01/2016 0336   LEUKOCYTESUR NEGATIVE 06/01/2016 0336  Sepsis Labs: @LABRCNTIP (procalcitonin:4,lacticidven:4)  ) Recent Results (from the past 240 hour(s))  Blood Culture (routine x 2)     Status: None (Preliminary result)   Collection Time: 05/31/16  4:07 PM  Result Value Ref Range Status   Specimen Description RIGHT ANTECUBITAL  Final   Special Requests BOTTLES DRAWN AEROBIC AND ANAEROBIC 5CC  Final   Culture   Final    NO GROWTH < 12 HOURS Performed at [facility], [place] [address], [place], [place] [address]    Report Status PENDING  Incomplete  MRSA PCR Screening     Status: None   Collection Time: 05/31/16  6:26 PM  Result Value Ref Range Status   MRSA by PCR NEGATIVE NEGATIVE Final    Comment:        The GeneXpert MRSA Assay (FDA approved for NASAL specimens only), is one component of a comprehensive MRSA colonization surveillance program. It is not intended to diagnose MRSA infection nor to guide or monitor treatment for MRSA infections.   Blood Culture (routine x 2)     Status: None (Preliminary result)   Collection Time: 05/31/16  7:27 PM  Result Value Ref  Range Status   Specimen Description BLOOD LEFT HAND  Final   Special Requests IN PEDIATRIC BOTTLE 1CC  Final   Culture   Final    NO GROWTH < 12 HOURS Performed at [facility], [place] [address] [place], [place] [address]    Report Status PENDING  Incomplete  Urine culture     Status: None   Collection Time: 06/01/16  3:36 AM  Result Value Ref Range Status   Specimen Description URINE, CATHETERIZED  Final   Special Requests NONE  Final   Culture   Final    NO GROWTH Performed at [facility], [address] [place], [place] [address]    Report Status 06/02/2016 FINAL  Final     Invalid input(s): PROCALCITONIN, LACTICACIDVEN   Radiology Studies: Ct Head Wo Contrast  Result Date: 05/31/2016 CLINICAL DATA:  76 y/o  F; altered mental status. EXAM: CT HEAD WITHOUT CONTRAST TECHNIQUE: Contiguous axial images were obtained from the base of the skull through the vertex without intravenous contrast. COMPARISON:  05/14/2016 MRI of the brain. 05/14/2016 CT of the head. FINDINGS: Brain: No evidence of acute infarction, hemorrhage, hydrocephalus, extra-axial collection or mass lesion/mass effect. Stable small chronic infarcts in right frontal white matter and the left cerebellar hemisphere. Stable moderate chronic microvascular ischemic changes and mild parenchymal volume loss of the brain. Vascular: Calcific atherosclerosis of cavernous and paraclinoid internal carotid arteries. Skull: Normal. Negative for fracture or focal lesion. Sinuses/Orbits: No acute finding. Other: Bilateral intra-ocular lens replacement. IMPRESSION: 1. No acute intracranial abnormality is identified. 2. Stable moderate chronic microvascular ischemic changes and mild parenchymal volume loss of the brain. Stable chronic infarcts in right frontal white matter and left cerebellar hemisphere. Electronically Signed   By: Kristine Garbe M.D.   On: 05/31/2016 15:49   Dg Chest Port 1 View  Result Date:  05/31/2016 CLINICAL DATA:  Altered mental status EXAM: PORTABLE CHEST 1 VIEW COMPARISON:  01/19/2016 FINDINGS: Heart size normal. Ectasia of the thoracic aorta. Negative for heart failure. Negative for pneumonia or effusion. Chronic right-sided rib fractures. Apical scarring bilaterally. Chronic shoulder arthropathy bilaterally. IMPRESSION: No active disease. Electronically Signed   By: Franchot Gallo M.D.   On: 05/31/2016 15:38        Scheduled Meds: . amLODipine  5  mg Oral Daily  . ceFEPime (MAXIPIME) IV  1 g Intravenous Q24H  . clopidogrel  75 mg Oral Q breakfast  . heparin  5,000 Units Subcutaneous Q8H  . hydrocortisone sod succinate (SOLU-CORTEF) inj  50 mg Intravenous Q12H  . insulin aspart  3 Units Subcutaneous TID WC  . insulin glargine  5 Units Subcutaneous QHS  . metoprolol  100 mg Oral BID  . pregabalin  75 mg Oral QHS  . sodium chloride flush  3 mL Intravenous Q12H  . vancomycin  500 mg Intravenous Q48H   Continuous Infusions: . sodium chloride 50 mL/hr at 06/02/16 0400     LOS: 2 days    Time spent: 35 minutes    Jameah Rouser A, MD [facility] Pager [phone]  If 7PM-7AM, please contact night-coverage [email or web address] Password [facility] 06/02/2016, 9:56 AM

## 2016-06-02 NOTE — Progress Notes (Signed)
[facility] Progress Note    Assessment/ Plan:   1.  Hyponatremia: Hypotonic hyponatremia.   Improving with IVF.  115--> 129.  Decreased rate of IVF to 50 mL/ hr to avoid overcorrection.  Can resume daily BMPs  2.  Severe sepsis: urinary, pulmonary sources possible.  Blood culture obtained, empiric antibiotics, IVF as above, stress dose steroids.  No cultures positive yet. On Vanc/ cefepime. 3.  Hyperkalemia: improving  4.  AKI: 2/2 sepsis and hypovolemia, improving  5.  Possible adrenal insufficiency- stress dose steroids, could partiall explain hyponatremia with concomitant hyperkalemia.  6.  Recent strokes: was in rehab; still has some deficits   We will sign off.  Please don't hesistate to contact us with any questions or concerns.  Subjective:   Sleeping today.  Had some Cp; EKG without overt ischemic changes.   Objective:   BP (!) 165/63 (BP Location: Left Arm)   Pulse 83   Temp 98.1 F (36.7 C) (Oral)   Resp (!) 26   Ht 4\' 11"  (1.499 m)   Wt 49.8 kg (109 lb 12.6 oz)   SpO2 100%   BMI 22.17 kg/m   Intake/Output Summary (Last 24 hours) at 06/02/16 1345 Last data filed at 06/02/16 0400  Gross per 24 hour  Intake              805 ml  Output             1200 ml  Net             -395 ml   Weight change:   Physical Exam: GEN: much better appearing than yesterday HEENT +disconjugate gaze, sclerae anicteric, tacky MM NECK No JVD PULM tachypneic, clear  CV tachycardic, III/VI systolic murmur LUSB ABD soft, nontender EXT very trace edema  NEURO: alert and talkative MSK: +bilateral deformities characteristic of RA  Imaging: Ct Head Wo Contrast  Result Date: 05/31/2016 CLINICAL DATA:  76 y/o  F; altered mental status. EXAM: CT HEAD WITHOUT CONTRAST TECHNIQUE: Contiguous axial images were obtained from the base of the skull through the vertex without intravenous contrast. COMPARISON:  05/14/2016 MRI of the brain. 05/14/2016 CT of the head.  FINDINGS: Brain: No evidence of acute infarction, hemorrhage, hydrocephalus, extra-axial collection or mass lesion/mass effect. Stable small chronic infarcts in right frontal white matter and the left cerebellar hemisphere. Stable moderate chronic microvascular ischemic changes and mild parenchymal volume loss of the brain. Vascular: Calcific atherosclerosis of cavernous and paraclinoid internal carotid arteries. Skull: Normal. Negative for fracture or focal lesion. Sinuses/Orbits: No acute finding. Other: Bilateral intra-ocular lens replacement. IMPRESSION: 1. No acute intracranial abnormality is identified. 2. Stable moderate chronic microvascular ischemic changes and mild parenchymal volume loss of the brain. Stable chronic infarcts in right frontal white matter and left cerebellar hemisphere. Electronically Signed   By: Kristine Garbe M.D.   On: 05/31/2016 15:49   Dg Chest Port 1 View  Result Date: 05/31/2016 CLINICAL DATA:  Altered mental status EXAM: PORTABLE CHEST 1 VIEW COMPARISON:  01/19/2016 FINDINGS: Heart size normal. Ectasia of the thoracic aorta. Negative for heart failure. Negative for pneumonia or effusion. Chronic right-sided rib fractures. Apical scarring bilaterally. Chronic shoulder arthropathy bilaterally. IMPRESSION: No active disease. Electronically Signed   By: Franchot Gallo M.D.   On: 05/31/2016 15:38    Labs: BMET  Recent Labs Lab 06/01/16 0238 06/01/16 0740 06/01/16 1137 06/01/16 1539 06/01/16 1823 06/01/16 2254 06/02/16 0343  NA 117* 121* 123* 125* 126* 127* 127*  K 5.8* 5.3* 4.5 4.3 4.5 5.1 5.1  CL 85* 90* 92* 94* 93* 95* 96*  CO2 20* 21* 20* 25 24 25 25   GLUCOSE 488* 471* 433* 112* 109* 168* 180*  BUN 32* 29* 27* 27* 25* 24* 23*  CREATININE 1.55* 1.45* 1.58* 1.34* 1.25* 1.17* 1.24*  CALCIUM 7.0* 7.5* 7.4* 7.6* 7.7* 7.6* 7.7*   CBC  Recent Labs Lab 05/31/16 1505 06/01/16 0740 06/02/16 0343  WBC 14.2* 15.2* 12.7*  HGB 8.4* 7.6* 8.2*  HCT  23.2* 22.1* 23.2*  MCV 82.0 84.0 83.8  PLT 361 273 310    Medications:    . amLODipine  5 mg Oral Daily  . ceFEPime (MAXIPIME) IV  1 g Intravenous Q24H  . clopidogrel  75 mg Oral Q breakfast  . heparin  5,000 Units Subcutaneous Q8H  . insulin aspart  4 Units Subcutaneous TID WC  . insulin glargine  6 Units Subcutaneous QHS  . metoprolol  100 mg Oral BID  . [START ON 06/03/2016] predniSONE  40 mg Oral Q breakfast  . pregabalin  75 mg Oral QHS  . sodium chloride flush  3 mL Intravenous Q12H  . vancomycin  500 mg Intravenous Q24H      Madelon Lips MD [facility] pgr [phone] 06/02/2016, 1:45 PM

## 2016-06-03 LAB — GLUCOSE, CAPILLARY
GLUCOSE-CAPILLARY: 193 mg/dL — AB (ref 65–99)
GLUCOSE-CAPILLARY: 134 mg/dL — AB (ref 65–99)
GLUCOSE-CAPILLARY: 205 mg/dL — AB (ref 65–99)
Glucose-Capillary: 35 mg/dL — CL (ref 65–99)
Glucose-Capillary: 121 mg/dL — ABNORMAL HIGH (ref 65–99)
Glucose-Capillary: 70 mg/dL (ref 65–99)
Glucose-Capillary: 138 mg/dL — ABNORMAL HIGH (ref 65–99)
Glucose-Capillary: 138 mg/dL — ABNORMAL HIGH (ref 65–99)

## 2016-06-03 LAB — BASIC METABOLIC PANEL
Anion gap: 5 (ref 5–15)
BUN: 20 mg/dL (ref 6–20)
CO2: 27 mmol/L (ref 22–32)
CREATININE: 1.29 mg/dL — AB (ref 0.44–1.00)
Calcium: 7.8 mg/dL — ABNORMAL LOW (ref 8.9–10.3)
Chloride: 98 mmol/L — ABNORMAL LOW (ref 101–111)
GFR calc Af Amer: 46 mL/min — ABNORMAL LOW (ref >60)
GFR, EST NON AFRICAN AMERICAN: 39 mL/min — AB (ref >60)
Glucose, Bld: 135 mg/dL — ABNORMAL HIGH (ref 65–99)
Potassium: 4.8 mmol/L (ref 3.5–5.1)
SODIUM: 130 mmol/L — AB (ref 135–145)

## 2016-06-03 LAB — CBC
HCT: 22.1 % — ABNORMAL LOW (ref 36.0–46.0)
Hemoglobin: 7.6 g/dL — ABNORMAL LOW (ref 12.0–15.0)
MCH: 29.5 pg (ref 26.0–34.0)
MCHC: 34.4 g/dL (ref 30.0–36.0)
MCV: 85.7 fL (ref 78.0–100.0)
PLATELETS: 366 10*3/uL (ref 150–400)
RBC: 2.58 MIL/uL — ABNORMAL LOW (ref 3.87–5.11)
RDW: 14.3 % (ref 11.5–15.5)
WBC: 9.5 10*3/uL (ref 4.0–10.5)

## 2016-06-03 MED ORDER — DEXTROSE 50 % IV SOLN
INTRAVENOUS | Status: AC
  Administered 2016-06-03: 50 mL
  Filled 2016-06-03: qty 50

## 2016-06-03 MED ORDER — LEVOFLOXACIN 500 MG PO TABS: 500.0000 mg | ORAL_TABLET | ORAL | Status: DC

## 2016-06-03 MED ORDER — LEVOFLOXACIN 750 MG PO TABS
750.0000 mg | ORAL_TABLET | Freq: Once | ORAL | Status: AC
  Administered 2016-06-03: 750 mg via ORAL
  Filled 2016-06-03: qty 1

## 2016-06-03 MED ORDER — INSULIN GLARGINE 100 UNIT/ML ~~LOC~~ SOLN
5.0000 [IU] | Freq: Every day | SUBCUTANEOUS | Status: DC
  Administered 2016-06-03: 5 [IU] via SUBCUTANEOUS
  Filled 2016-06-03 (×2): qty 0.05

## 2016-06-03 NOTE — Progress Notes (Signed)
Foley catheter removed at 1430.  Due to void by 2230.  Night shift made aware.

## 2016-06-03 NOTE — NC FL2 (Signed)
[place] LEVEL OF CARE SCREENING TOOL     IDENTIFICATION  Patient Name: Marisa [name] Birthdate: July 15, 1940 Sex: female Admission Date (Current Location): 05/31/2016  [facility] and [place] Number:  Herbalist and Address:  [facility],  [place] [address] [place]      Provider Number: [id number]  Attending Physician Name and Address:  Verlee Monte, MD  Relative Name and Phone Number:       Current Level of Care: Hospital Recommended Level of Care: [place] Prior Approval Number:    Date Approved/Denied:   PASRR Number: [id number]  Discharge Plan: SNF    Current Diagnoses: Patient Active Problem List   Diagnosis Date Noted  . Hyponatremia 05/31/2016  . Acute renal failure (ARF) ([place]) 05/31/2016  . Current chronic use of systemic steroids 05/31/2016  . Cerebrovascular accident (CVA) ([place])   . Uncontrolled type 2 diabetes mellitus with complication ([place])   . CKD (chronic kidney disease), stage III   . Chronic respiratory failure with hypoxia ([place])   . Panlobular emphysema ([place])   . Stroke ([place])   . Diplopia   . Brainstem infarct, acute ([place]) 05/14/2016  . Aortic dissection ([place]) 05/14/2016  . Diabetes mellitus with complication ([place])   . Rib fractures 01/18/2016  . Chronic renal insufficiency, stage 3 (moderate)   . Memory loss 05/09/2015  . Immunosuppressed status ([place])   . Shoulder effusion 05/28/2014  . Rheumatoid arthritis ([place]) 05/28/2014  . Insulin dependent type 2 diabetes mellitus, uncontrolled ([place]) 05/28/2014  . Symptomatic anemia 04/13/2014  . Iron deficiency anemia due to chronic blood loss 03/07/2013  . Chronic respiratory failure ([place]) 02/11/2013  . 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 Personal history of colonic polyps 01/05/2013  . Protein calorie malnutrition ([place]) 10/08/2011  . Sepsis ([place]) 09/29/2011  . Diabetes mellitus type 2, insulin dependent ([place])   . Anxiety state 06/08/2007   . HTN (hypertension) 06/08/2007  . UNSPECIFIED ACUTE PERICARDITIS 06/08/2007  . SINUS ARRHYTHMIA 06/08/2007  . HEMORRHOIDS, INTERNAL 06/08/2007  . ALLERGIC RHINITIS 06/08/2007  . COPD (chronic obstructive pulmonary disease) ([place]) 06/08/2007  . ESOPHAGEAL STRICTURE 06/08/2007  . GERD 06/08/2007  . CONSTIPATION, CHRONIC 06/08/2007  . IRRITABLE BOWEL SYNDROME 06/08/2007  . SLEEP APNEA 06/08/2007  . HEADACHE, CHRONIC 06/08/2007  . NEOPLASM, MALIGNANT, CARCINOMA, BASAL CELL, NOSE 06/08/2007    Orientation RESPIRATION BLADDER Height & Weight     Self, Place  O2 (3L) Incontinent, Indwelling catheter Weight: 109 lb 12.6 oz (49.8 kg) Height:  4\' 11"  (149.9 cm)  BEHAVIORAL SYMPTOMS/MOOD NEUROLOGICAL BOWEL NUTRITION STATUS      Incontinent Diet (Carb Modified)  AMBULATORY STATUS COMMUNICATION OF NEEDS Skin   Extensive Assist Verbally  (Pressure Injury 06/02/16 Stage I -  Intact skin with non-blanchable redness of a localized area usually over a bony prominence. majority of sacrum red but blanchable with the exception  (mid sacrum) )                       Personal Care Assistance Level of Assistance  Bathing, Dressing Bathing Assistance: Maximum assistance Feeding assistance: Limited assistance Dressing Assistance: Maximum assistance     Functional Limitations Info  Sight, Hearing, Speech Sight Info: Adequate Hearing Info: Adequate Speech Info: Impaired    SPECIAL CARE FACTORS FREQUENCY  PT (By licensed PT), OT (By licensed OT)     PT Frequency: 5 OT Frequency: 5  Contractures      Additional Factors Info  Code Status, Allergies Code Status Info: Fullcode Allergies Info: Demerol Meperidine, Penicillins, Enbrel Etanercept, Humira Adalimumab           Current Medications (06/03/2016):  This is the current hospital active medication list Current Facility-Administered Medications  Medication Dose Route Frequency Provider Last Rate Last Dose  . 0.9 %   sodium chloride infusion   Intravenous Continuous Verlee Monte, MD 75 mL/hr at 06/02/16 1245    . acetaminophen (TYLENOL) tablet 650 mg  650 mg Oral Q6H PRN Verlee Monte, MD   650 mg at 06/01/16 1733   Or  . acetaminophen (TYLENOL) suppository 650 mg  650 mg Rectal Q6H PRN Verlee Monte, MD      . amLODipine (NORVASC) tablet 5 mg  5 mg Oral Daily Verlee Monte, MD   5 mg at 06/03/16 0908  . aspirin EC tablet 81 mg  81 mg Oral Daily Verlee Monte, MD   81 mg at 06/03/16 0908  . ceFEPIme (MAXIPIME) 1 g in dextrose 5 % 50 mL IVPB  1 g Intravenous Q24H Quintella Reichert, MD   1 g at 06/02/16 1835  . clopidogrel (PLAVIX) tablet 75 mg  75 mg Oral Q breakfast Verlee Monte, MD   75 mg at 06/03/16 0907  . heparin injection 5,000 Units  5,000 Units Subcutaneous Q8H Verlee Monte, MD   5,000 Units at 06/03/16 0612  . HYDROcodone-acetaminophen (NORCO/VICODIN) 5-325 MG per tablet 1-2 tablet  1-2 tablet Oral Q4H PRN Verlee Monte, MD   2 tablet at 06/03/16 0911  . insulin aspart (novoLOG) injection 4 Units  4 Units Subcutaneous TID WC Verlee Monte, MD   4 Units at 06/02/16 1719  . insulin glargine (LANTUS) injection 6 Units  6 Units Subcutaneous QHS Verlee Monte, MD   6 Units at 06/02/16 2129  . metoprolol tartrate (LOPRESSOR) tablet 100 mg  100 mg Oral BID Verlee Monte, MD   100 mg at 06/03/16 0908  . ondansetron (ZOFRAN) tablet 4 mg  4 mg Oral Q6H PRN Verlee Monte, MD       Or  . ondansetron (ZOFRAN) injection 4 mg  4 mg Intravenous Q6H PRN Verlee Monte, MD      . predniSONE (DELTASONE) tablet 40 mg  40 mg Oral Q breakfast Verlee Monte, MD   40 mg at 06/03/16 0907  . pregabalin (LYRICA) capsule 75 mg  75 mg Oral QHS Jani Gravel, MD   75 mg at 06/02/16 2128  . sodium chloride flush (NS) 0.9 % injection 3 mL  3 mL Intravenous Q12H Verlee Monte, MD   3 mL at 06/02/16 2129  . vancomycin (VANCOCIN) 500 mg in sodium chloride 0.9 % 100 mL IVPB  500 mg Intravenous Q24H Verlee Monte, MD   500 mg at 06/02/16 1311      Discharge Medications: Please see discharge summary for a list of discharge medications.  Relevant Imaging Results:  Relevant Lab Results:   Additional Information SS#: [id number]   Standley Brooking, LCSW

## 2016-06-03 NOTE — Evaluation (Signed)
Physical Therapy Evaluation Patient Details Name: Marisa [name] MRN: [id number] DOB: July 08, 1940 Today's Date: 06/03/2016   History of Present Illness  Pt is a 76 y/o female admitted with confusion, fever, hyponatremia. recently  admitted for stroke, DC'd to SNF facility.[id number], DM, HTN, RA and NSTEMI in 2012.  Clinical Impression  The patient  Requires 2 assist for mobility. Plans return to SNF. Pt admitted with above diagnosis. Pt currently with functional limitations due to the deficits listed below (see PT Problem List).  Pt will benefit from skilled PT to increase their independence and safety with mobility to allow discharge to the venue listed below.       Follow Up Recommendations SNF;Supervision/Assistance - 24 hour    Equipment Recommendations  None recommended by PT    Recommendations for Other Services       Precautions / Restrictions Precautions Precautions: Fall Precaution Comments: uses 02 at home      Mobility  Bed Mobility Overal bed mobility: Needs Assistance  Rolling- patient initiates turning , assist with trunk. Bed Mobility: Supine to Sit Rolling: Mod assist     Sit to supine: Max assist;+2 for physical assistance   General bed mobility comments:  patient moves legs, assist with trunk.  assist with legs and trunk to supine  Transfers                 General transfer comment: NT, needed BM.  Ambulation/Gait                Stairs            Wheelchair Mobility    Modified Rankin (Stroke Patients Only)       Balance Overall balance assessment: Needs assistance Sitting-balance support: Feet unsupported;Bilateral upper extremity supported Sitting balance-Leahy Scale: Poor   Postural control: Posterior lean                                   Pertinent Vitals/Pain Pain Assessment: Faces Faces Pain Scale: Hurts whole lot Pain Location: all over Pain Descriptors / Indicators: Aching;Sore Pain  Intervention(s): Monitored during session;Repositioned    Home Living Family/patient expects to be discharged to:: Skilled nursing facility                 Additional Comments: Pt lives at home with her husband who is her primary caregiver. Pt with dementia at baseline .    Prior Function Level of Independence: Needs assistance   Gait / Transfers Assistance Needed: was ambualting at rehab w/ RW per family           Hand Dominance        Extremity/Trunk Assessment   Upper Extremity Assessment RUE Deficits / Details: severe arthritic deformities and limitations of all joints RUE Coordination: decreased fine motor;decreased gross motor LUE Deficits / Details: severe arthritic deformities with limitations in all joints LUE Coordination: decreased fine motor;decreased gross motor    Lower Extremity Assessment Lower Extremity Assessment: RLE deficits/detail;LLE deficits/detail RLE Deficits / Details: lifts leg from bed, joint defromities LLE Deficits / Details: same as right    Cervical / Trunk Assessment Cervical / Trunk Assessment: Kyphotic  Communication      Cognition Arousal/Alertness: Awake/alert Behavior During Therapy: WFL for tasks assessed/performed Overall Cognitive Status: History of cognitive impairments - at baseline  General Comments      Exercises     Assessment/Plan    PT Assessment Patient needs continued PT services  PT Problem List Decreased strength;Decreased range of motion;Decreased activity tolerance;Decreased balance;Decreased mobility;Decreased knowledge of precautions;Decreased safety awareness;Decreased knowledge of use of DME;Cardiopulmonary status limiting activity       PT Treatment Interventions DME instruction;Gait training;Stair training;Functional mobility training;Therapeutic activities;Therapeutic exercise;Balance training;Neuromuscular re-education;Patient/family education;Cognitive remediation     PT Goals (Current goals can be found in the Care Plan section)  Acute Rehab PT Goals Patient Stated Goal: none stated PT Goal Formulation: With family Time For Goal Achievement: 06/17/16 Potential to Achieve Goals: Fair    Frequency Min 2X/week   Barriers to discharge        Co-evaluation               End of Session   Activity Tolerance: Patient tolerated treatment well Patient left: in bed;with call bell/phone within reach;with nursing/sitter in room Nurse Communication: Mobility status PT Visit Diagnosis: Unsteadiness on feet (R26.81);Muscle weakness (generalized) (M62.81);Dizziness and giddiness (R42);Other symptoms and signs involving the nervous system (R29.898)         Time: [id number] PT Time Calculation (min) (ACUTE ONLY): 24 min   Charges:   PT Evaluation $PT Eval Low Complexity: 1 Procedure PT Treatments $Therapeutic Activity: 8-22 mins   PT G Codes:         Marcelino Freestone PT [id number]  06/03/2016, 3:09 PM

## 2016-06-03 NOTE — Progress Notes (Signed)
Inpatient Diabetes Program Recommendations  AACE/ADA: New Consensus Statement on Inpatient Glycemic Control (2015)  Target Ranges:  Prepandial:   less than 140 mg/dL      Peak postprandial:   less than 180 mg/dL (1-2 hours)      Critically ill patients:  140 - 180 mg/dL   Lab Results  Component Value Date   GLUCAP 138 (H) 06/03/2016   HGBA1C 6.8 (H) 05/31/2016    Review of Glycemic Control  Diabetes history: DM2 Outpatient Diabetes medications: Levemir 3 units QHS, Novolog 8 units tidwc Current orders for Inpatient glycemic control: Lantus 5 units QHS, Novolog 4 units tidwc.  Inpatient Diabetes Program Recommendations:    Change Lantus to Levemir as pt is on Levemir at [facility]. D/C Novolog 4 units tidwc since pt is eating < 50% meals Add Novolog sensitive tidwc When po intake improves, add Novolog 2-3 units tidwc for meal coverage.  Will follow.  Thank you. Lorenda Peck, RD, LDN, CDE Inpatient Diabetes Coordinator [phone]

## 2016-06-03 NOTE — Progress Notes (Signed)
PROGRESS NOTE  Marisa [name]  [id number] DOB: [birth year] DOA: 05/31/2016 PCP: Jani Gravel, MD Outpatient Specialists:  Subjective: Awake and alert, denies any fever or chills. Sodium is 130 this morning. If sodium continued to improve my discharged back to nursing home in a.m.  Brief Narrative:  Marisa [name] is a 76 y.o. female with medical history significant of recent stroke, discharge from [facility] to nursing home on 05/23/2016 after she was treated for stroke. Patient is lethargic, most of the history obtained from the EDP notes and daughter at bedside. Her daughter mentioned she was doing okay after she was discharged, was able to get up with the help of personnel and walker and actually woke all the way to the bathroom. For the past couple of days she started to be more lethargic, today she developed fever and her blood sugar was high so they brought her to the hospital for further evaluation. She was found to have sodium of 115, acute renal failure and fever of 101.8.  Assessment & Plan:   Principal Problem:   Sepsis ([place]) Active Problems:   Rheumatoid arthritis ([place])   Stroke ([place])   Uncontrolled type 2 diabetes mellitus with complication (HCC)   Hyponatremia   Acute renal failure (ARF) ([place])   Current chronic use of systemic steroids   Sepsis Presented with temp of 101.8, RR of 28 and suspicion of infection. Lactic acid was 4.1, improved to 0.9 after IV fluid hydration. Continue IV fluid hydration and IV antibiotics. Sepsis physiology resolved.  Severe hyponatremia Sodium is 115, corrects to 122 for glucose of 397. Was on normal saline at 75 mL per hour, sodium every 4 hours in the first 24 hours. Sodium is 130 this morning, continue IV fluid hydration, follow BMP and creatinine a.m.  Hyperglycemia, insulin-dependent diabetes mellitus Presented with glucose of 397, does not have any evidence of ketosis or acidosis. Restarted on Lantus 5 units at  night and sensitive SSI. Developed a hypoglycemic episode as patient received 28 units of NovoLog 10+9+9. Adjust insulin regimen, increase Lantus to 5 units and prandial NovoLog to 4 units.  Acute renal failure Creatinine is 1.79, based on BUN/cr ratio which is 22 this is appears to be secondary to dehydration/volume depletion. Patient given 1.5 L of normal saline as part of the sepsis protocol, continue and is at 75 mL/hour. Creatinine continues to improve, 1.2 this morning, continue IV fluids.  Recent stroke Is on Plavix, on hold because she was lethargic, restart today if she can tolerate it.  Current use of systemic steroids/rheumatoid arthritis Patient is on 10 mg of prednisone at home for rheumatoid arthritis. Although there was no low blood pressure low sodium and high potassium fits the picture of hypoadrenalism. Patient placed on Solu-Cortef 50 mg twice a day, we will taper off as tolerated.  Hypertension Blood pressure medications held except metoprolol to avoid rebound tachycardia. Restart blood pressure medications gradually.   DVT prophylaxis: Subcutaneous heparin Code Status: DNR Family Communication: Discussed with her husband at bedside Disposition Plan: Likely to be discharge back to the nursing home in a.m. Diet: Diet Carb Modified Fluid consistency: Thin; Room service appropriate? Yes  Consultants:   Nephrology  Procedures:   None  Antimicrobials:   None   Objective: Vitals:   06/02/16 1400 06/02/16 1646 06/02/16 2102 06/03/16 0531  BP:  (!) 154/57 (!) 152/65 (!) 142/62  Pulse: 71 73 84 71  Resp: 18 18 18 18   Temp:   98.7 F (  37.1 C) 98.1 F (36.7 C)  TempSrc:  Oral Oral Oral  SpO2: 100% 100% 100% 100%  Weight:      Height:        Intake/Output Summary (Last 24 hours) at 06/03/16 1142 Last data filed at 06/03/16 0900  Gross per 24 hour  Intake             2510 ml  Output              550 ml  Net             1960 ml   Filed Weights    05/31/16 1445 05/31/16 1826  Weight: 45.4 kg (100 lb) 49.8 kg (109 lb 12.6 oz)    Examination: General exam: Appears calm and comfortable  Respiratory system: Clear to auscultation. Respiratory effort normal. Cardiovascular system: S1 & S2 heard, RRR. No JVD, murmurs, rubs, gallops or clicks. No pedal edema. Gastrointestinal system: Abdomen is nondistended, soft and nontender. No organomegaly or masses felt. Normal bowel sounds heard. Central nervous system: Alert and oriented. No focal neurological deficits. Extremities: Symmetric 5 x 5 power. Skin: No rashes, lesions or ulcers Psychiatry: Judgement and insight appear normal. Mood & affect appropriate.   Data Reviewed: I have personally reviewed following labs and imaging studies  CBC:  Recent Labs Lab 05/31/16 1505 06/01/16 0740 06/02/16 0343 06/03/16 0507  WBC 14.2* 15.2* 12.7* 9.5  HGB 8.4* 7.6* 8.2* 7.6*  HCT 23.2* 22.1* 23.2* 22.1*  MCV 82.0 84.0 83.8 85.7  PLT 361 273 310 [id number]   Basic Metabolic Panel:  Recent Labs Lab 06/01/16 1539 06/01/16 1823 06/01/16 2254 06/02/16 0343 06/03/16 0507  NA 125* 126* 127* 127* 130*  K 4.3 4.5 5.1 5.1 4.8  CL 94* 93* 95* 96* 98*  CO2 25 24 25 25 27   GLUCOSE 112* 109* 168* 180* 135*  BUN 27* 25* 24* 23* 20  CREATININE 1.34* 1.25* 1.17* 1.24* 1.29*  CALCIUM 7.6* 7.7* 7.6* 7.7* 7.8*   GFR: Estimated Creatinine Clearance: 25.7 mL/min (by C-G formula based on SCr of 1.29 mg/dL (H)). Liver Function Tests:  Recent Labs Lab 05/31/16 1505  AST 23  ALT 18  ALKPHOS 105  BILITOT 0.7  PROT 5.8*  ALBUMIN 3.2*   No results for input(s): LIPASE, AMYLASE in the last 168 hours. No results for input(s): AMMONIA in the last 168 hours. Coagulation Profile:  Recent Labs Lab 05/31/16 1927  INR 0.92   Cardiac Enzymes:  Recent Labs Lab 06/02/16 1321  TROPONINI <0.03   BNP (last 3 results) No results for input(s): PROBNP in the last 8760 hours. HbA1C:  Recent Labs   05/31/16 1927  HGBA1C 6.8*   CBG:  Recent Labs Lab 06/02/16 2059 06/03/16 0308 06/03/16 0336 06/03/16 0529 06/03/16 0813  GLUCAP 105* 35* 193* 121* 70   Lipid Profile: No results for input(s): CHOL, HDL, LDLCALC, TRIG, CHOLHDL, LDLDIRECT in the last 72 hours. Thyroid Function Tests:  Recent Labs  05/31/16 1927  TSH 0.861   Anemia Panel: No results for input(s): VITAMINB12, FOLATE, FERRITIN, TIBC, IRON, RETICCTPCT in the last 72 hours. Urine analysis:    Component Value Date/Time   COLORURINE STRAW (A) 06/01/2016 0336   APPEARANCEUR CLEAR 06/01/2016 0336   LABSPEC 1.007 06/01/2016 0336   PHURINE 5.0 06/01/2016 0336   GLUCOSEU >=500 (A) 06/01/2016 0336   HGBUR NEGATIVE 06/01/2016 0336   BILIRUBINUR NEGATIVE 06/01/2016 0336   KETONESUR 20 (A) 06/01/2016 0336   PROTEINUR NEGATIVE 06/01/2016 0336  UROBILINOGEN 0.2 05/28/2014 1021   NITRITE NEGATIVE 06/01/2016 0336   LEUKOCYTESUR NEGATIVE 06/01/2016 0336   Sepsis Labs: @LABRCNTIP (procalcitonin:4,lacticidven:4)  ) Recent Results (from the past 240 hour(s))  Blood Culture (routine x 2)     Status: None (Preliminary result)   Collection Time: 05/31/16  4:07 PM  Result Value Ref Range Status   Specimen Description RIGHT ANTECUBITAL  Final   Special Requests BOTTLES DRAWN AEROBIC AND ANAEROBIC 5CC  Final   Culture   Final    NO GROWTH 2 DAYS Performed at [facility] [address] [place], [place] [address]    Report Status PENDING  Incomplete  MRSA PCR Screening     Status: None   Collection Time: 05/31/16  6:26 PM  Result Value Ref Range Status   MRSA by PCR NEGATIVE NEGATIVE Final    Comment:        The GeneXpert MRSA Assay (FDA approved for NASAL specimens only), is one component of a comprehensive MRSA colonization surveillance program. It is not intended to diagnose MRSA infection nor to guide or monitor treatment for MRSA infections.   Blood Culture (routine x 2)     Status: None  (Preliminary result)   Collection Time: 05/31/16  7:27 PM  Result Value Ref Range Status   Specimen Description BLOOD LEFT HAND  Final   Special Requests IN PEDIATRIC BOTTLE 1CC  Final   Culture   Final    NO GROWTH 2 DAYS Performed at [facility], [place] [address], [place], [place] [address]    Report Status PENDING  Incomplete  Urine culture     Status: None   Collection Time: 06/01/16  3:36 AM  Result Value Ref Range Status   Specimen Description URINE, CATHETERIZED  Final   Special Requests NONE  Final   Culture   Final    NO GROWTH Performed at [facility], [address] [place], [place] [address]    Report Status 06/02/2016 FINAL  Final     Invalid input(s): PROCALCITONIN, LACTICACIDVEN   Radiology Studies: No results found.      Scheduled Meds: . amLODipine  5 mg Oral Daily  . aspirin EC  81 mg Oral Daily  . ceFEPime (MAXIPIME) IV  1 g Intravenous Q24H  . clopidogrel  75 mg Oral Q breakfast  . heparin  5,000 Units Subcutaneous Q8H  . insulin aspart  4 Units Subcutaneous TID WC  . insulin glargine  6 Units Subcutaneous QHS  . metoprolol  100 mg Oral BID  . predniSONE  40 mg Oral Q breakfast  . pregabalin  75 mg Oral QHS  . sodium chloride flush  3 mL Intravenous Q12H  . vancomycin  500 mg Intravenous Q24H   Continuous Infusions: . sodium chloride 75 mL/hr at 06/02/16 1245     LOS: 3 days    Time spent: 35 minutes    Arieh Bogue A, MD [facility] Pager [phone]  If 7PM-7AM, please contact night-coverage [email or web address] Password [facility] 06/03/2016, 11:42 AM

## 2016-06-03 NOTE — Progress Notes (Signed)
HS CBG was 105, pt ate snack with protein.  Pt has HX of CBG dropping in middle of night so 0300 CBG taken and it was 35.  Pt without S/S.  Pt treated as per protocol and CBG retaken and it was in the 190's.  Rechecked again 2 hours later and CBG 121.  Pt HS lantas was 6 Units and no SSI was given.  Pt and husband educated and will continue to monitor closely.

## 2016-06-03 NOTE — Clinical Social Work Placement (Signed)
CSW spoke with patient's husband, [name] at bedside re: discharge planning. Patient had been to [facility] for about a week, but patient's husband would like her to go to [facility] at discharge. CSW confirmed with Rollene Fare at [facility] that they would be able to take patient. CSW has completed FL2 & will continue to follow and assist with discharge when ready.    Raynaldo Opitz, [facility] Clinical Social Worker cell #: [phone]     CLINICAL SOCIAL WORK PLACEMENT  NOTE  Date:  06/03/2016  Patient Details  Name: Marisa [name] MRN: [id number] Date of Birth: 1940-10-15  Clinical Social Work is seeking post-discharge placement for this patient at the [place] level of care (*CSW will initial, date and re-position this form in  chart as items are completed):  Yes   Patient/family provided with [place] Work Department's list of facilities offering this level of care within the geographic area requested by the patient (or if unable, by the patient's family).  Yes   Patient/family informed of their freedom to choose among providers that offer the needed level of care, that participate in Medicare, Medicaid or managed care program needed by the patient, have an available bed and are willing to accept the patient.  Yes   Patient/family informed of [facility]'s ownership interest in [facility] and [facility], as well as of the fact that they are under no obligation to receive care at these facilities.  PASRR submitted to EDS on       PASRR number received on       Existing PASRR number confirmed on 06/03/16     FL2 transmitted to all facilities in geographic area requested by pt/family on 06/03/16     FL2 transmitted to all facilities within larger geographic area on       Patient informed that his/her managed care company has contracts with or will negotiate with certain facilities, including the  following:        Yes   Patient/family informed of bed offers received.  Patient chooses bed at [facility]     Physician recommends and patient chooses bed at      Patient to be transferred to [facility] on  .  Patient to be transferred to facility by       Patient family notified on   of transfer.  Name of family member notified:        PHYSICIAN       Additional Comment:    _______________________________________________ Standley Brooking, LCSW 06/03/2016, 12:02 PM

## 2016-06-04 DIAGNOSIS — J9601 Acute respiratory failure with hypoxia: Secondary | ICD-10-CM | POA: Diagnosis not present

## 2016-06-04 DIAGNOSIS — Z7952 Long term (current) use of systemic steroids: Secondary | ICD-10-CM | POA: Diagnosis not present

## 2016-06-04 DIAGNOSIS — R570 Cardiogenic shock: Secondary | ICD-10-CM | POA: Diagnosis not present

## 2016-06-04 DIAGNOSIS — G931 Anoxic brain damage, not elsewhere classified: Secondary | ICD-10-CM | POA: Diagnosis not present

## 2016-06-04 DIAGNOSIS — R6520 Severe sepsis without septic shock: Secondary | ICD-10-CM | POA: Diagnosis not present

## 2016-06-04 DIAGNOSIS — Z794 Long term (current) use of insulin: Secondary | ICD-10-CM | POA: Diagnosis not present

## 2016-06-04 DIAGNOSIS — R062 Wheezing: Secondary | ICD-10-CM | POA: Diagnosis not present

## 2016-06-04 DIAGNOSIS — R197 Diarrhea, unspecified: Secondary | ICD-10-CM | POA: Diagnosis not present

## 2016-06-04 DIAGNOSIS — E872 Acidosis: Secondary | ICD-10-CM | POA: Diagnosis not present

## 2016-06-04 DIAGNOSIS — R300 Dysuria: Secondary | ICD-10-CM | POA: Diagnosis not present

## 2016-06-04 DIAGNOSIS — K210 Gastro-esophageal reflux disease with esophagitis: Secondary | ICD-10-CM | POA: Diagnosis not present

## 2016-06-04 DIAGNOSIS — Z85828 Personal history of other malignant neoplasm of skin: Secondary | ICD-10-CM | POA: Diagnosis not present

## 2016-06-04 DIAGNOSIS — F329 Major depressive disorder, single episode, unspecified: Secondary | ICD-10-CM | POA: Diagnosis not present

## 2016-06-04 DIAGNOSIS — R0602 Shortness of breath: Secondary | ICD-10-CM | POA: Diagnosis not present

## 2016-06-04 DIAGNOSIS — E1122 Type 2 diabetes mellitus with diabetic chronic kidney disease: Secondary | ICD-10-CM | POA: Diagnosis not present

## 2016-06-04 DIAGNOSIS — D500 Iron deficiency anemia secondary to blood loss (chronic): Secondary | ICD-10-CM | POA: Diagnosis not present

## 2016-06-04 DIAGNOSIS — D72829 Elevated white blood cell count, unspecified: Secondary | ICD-10-CM | POA: Diagnosis not present

## 2016-06-04 DIAGNOSIS — I6930 Unspecified sequelae of cerebral infarction: Secondary | ICD-10-CM | POA: Diagnosis not present

## 2016-06-04 DIAGNOSIS — K580 Irritable bowel syndrome with diarrhea: Secondary | ICD-10-CM | POA: Diagnosis not present

## 2016-06-04 DIAGNOSIS — D638 Anemia in other chronic diseases classified elsewhere: Secondary | ICD-10-CM | POA: Diagnosis not present

## 2016-06-04 DIAGNOSIS — L8990 Pressure ulcer of unspecified site, unspecified stage: Secondary | ICD-10-CM | POA: Insufficient documentation

## 2016-06-04 DIAGNOSIS — T797XXA Traumatic subcutaneous emphysema, initial encounter: Secondary | ICD-10-CM | POA: Diagnosis not present

## 2016-06-04 DIAGNOSIS — R0902 Hypoxemia: Secondary | ICD-10-CM | POA: Diagnosis not present

## 2016-06-04 DIAGNOSIS — R0989 Other specified symptoms and signs involving the circulatory and respiratory systems: Secondary | ICD-10-CM | POA: Diagnosis not present

## 2016-06-04 DIAGNOSIS — I63213 Cerebral infarction due to unspecified occlusion or stenosis of bilateral vertebral arteries: Secondary | ICD-10-CM | POA: Diagnosis not present

## 2016-06-04 DIAGNOSIS — K219 Gastro-esophageal reflux disease without esophagitis: Secondary | ICD-10-CM | POA: Diagnosis not present

## 2016-06-04 DIAGNOSIS — R262 Difficulty in walking, not elsewhere classified: Secondary | ICD-10-CM | POA: Diagnosis not present

## 2016-06-04 DIAGNOSIS — N183 Chronic kidney disease, stage 3 (moderate): Secondary | ICD-10-CM | POA: Diagnosis not present

## 2016-06-04 DIAGNOSIS — M6281 Muscle weakness (generalized): Secondary | ICD-10-CM | POA: Diagnosis not present

## 2016-06-04 DIAGNOSIS — E118 Type 2 diabetes mellitus with unspecified complications: Secondary | ICD-10-CM | POA: Diagnosis not present

## 2016-06-04 DIAGNOSIS — R41841 Cognitive communication deficit: Secondary | ICD-10-CM | POA: Diagnosis not present

## 2016-06-04 DIAGNOSIS — R1312 Dysphagia, oropharyngeal phase: Secondary | ICD-10-CM | POA: Diagnosis not present

## 2016-06-04 DIAGNOSIS — N189 Chronic kidney disease, unspecified: Secondary | ICD-10-CM | POA: Diagnosis not present

## 2016-06-04 DIAGNOSIS — M069 Rheumatoid arthritis, unspecified: Secondary | ICD-10-CM | POA: Diagnosis not present

## 2016-06-04 DIAGNOSIS — N179 Acute kidney failure, unspecified: Secondary | ICD-10-CM | POA: Diagnosis not present

## 2016-06-04 DIAGNOSIS — S2243XA Multiple fractures of ribs, bilateral, initial encounter for closed fracture: Secondary | ICD-10-CM | POA: Diagnosis not present

## 2016-06-04 DIAGNOSIS — I469 Cardiac arrest, cause unspecified: Secondary | ICD-10-CM | POA: Diagnosis not present

## 2016-06-04 DIAGNOSIS — J9611 Chronic respiratory failure with hypoxia: Secondary | ICD-10-CM | POA: Diagnosis not present

## 2016-06-04 DIAGNOSIS — E871 Hypo-osmolality and hyponatremia: Secondary | ICD-10-CM | POA: Diagnosis not present

## 2016-06-04 DIAGNOSIS — D649 Anemia, unspecified: Secondary | ICD-10-CM | POA: Diagnosis not present

## 2016-06-04 DIAGNOSIS — E1165 Type 2 diabetes mellitus with hyperglycemia: Secondary | ICD-10-CM | POA: Diagnosis not present

## 2016-06-04 DIAGNOSIS — Z8673 Personal history of transient ischemic attack (TIA), and cerebral infarction without residual deficits: Secondary | ICD-10-CM | POA: Diagnosis not present

## 2016-06-04 DIAGNOSIS — R5381 Other malaise: Secondary | ICD-10-CM | POA: Diagnosis not present

## 2016-06-04 DIAGNOSIS — I129 Hypertensive chronic kidney disease with stage 1 through stage 4 chronic kidney disease, or unspecified chronic kidney disease: Secondary | ICD-10-CM | POA: Diagnosis not present

## 2016-06-04 DIAGNOSIS — J982 Interstitial emphysema: Secondary | ICD-10-CM | POA: Diagnosis not present

## 2016-06-04 DIAGNOSIS — Z7982 Long term (current) use of aspirin: Secondary | ICD-10-CM | POA: Diagnosis not present

## 2016-06-04 DIAGNOSIS — L89611 Pressure ulcer of right heel, stage 1: Secondary | ICD-10-CM | POA: Diagnosis not present

## 2016-06-04 DIAGNOSIS — I252 Old myocardial infarction: Secondary | ICD-10-CM | POA: Diagnosis not present

## 2016-06-04 DIAGNOSIS — E119 Type 2 diabetes mellitus without complications: Secondary | ICD-10-CM | POA: Diagnosis not present

## 2016-06-04 DIAGNOSIS — Z515 Encounter for palliative care: Secondary | ICD-10-CM | POA: Diagnosis not present

## 2016-06-04 DIAGNOSIS — J449 Chronic obstructive pulmonary disease, unspecified: Secondary | ICD-10-CM | POA: Diagnosis not present

## 2016-06-04 DIAGNOSIS — I10 Essential (primary) hypertension: Secondary | ICD-10-CM | POA: Diagnosis not present

## 2016-06-04 DIAGNOSIS — Z4682 Encounter for fitting and adjustment of non-vascular catheter: Secondary | ICD-10-CM | POA: Diagnosis not present

## 2016-06-04 DIAGNOSIS — J309 Allergic rhinitis, unspecified: Secondary | ICD-10-CM | POA: Diagnosis not present

## 2016-06-04 DIAGNOSIS — A419 Sepsis, unspecified organism: Secondary | ICD-10-CM | POA: Diagnosis not present

## 2016-06-04 LAB — BASIC METABOLIC PANEL
Anion gap: 4 — ABNORMAL LOW (ref 5–15)
BUN: 19 mg/dL (ref 6–20)
CALCIUM: 7.7 mg/dL — AB (ref 8.9–10.3)
CO2: 26 mmol/L (ref 22–32)
Chloride: 102 mmol/L (ref 101–111)
Creatinine, Ser: 0.99 mg/dL (ref 0.44–1.00)
GFR calc Af Amer: >60 mL/min (ref >60)
GFR, EST NON AFRICAN AMERICAN: 54 mL/min — AB (ref >60)
GLUCOSE: 75 mg/dL (ref 65–99)
Potassium: 4.6 mmol/L (ref 3.5–5.1)
Sodium: 132 mmol/L — ABNORMAL LOW (ref 135–145)

## 2016-06-04 LAB — CBC
HCT: 20.6 % — ABNORMAL LOW (ref 36.0–46.0)
Hemoglobin: 7.1 g/dL — ABNORMAL LOW (ref 12.0–15.0)
MCH: 29.7 pg (ref 26.0–34.0)
MCHC: 34.5 g/dL (ref 30.0–36.0)
MCV: 86.2 fL (ref 78.0–100.0)
PLATELETS: 309 10*3/uL (ref 150–400)
RBC: 2.39 MIL/uL — ABNORMAL LOW (ref 3.87–5.11)
RDW: 14.3 % (ref 11.5–15.5)
WBC: 7.8 10*3/uL (ref 4.0–10.5)

## 2016-06-04 LAB — GLUCOSE, CAPILLARY
GLUCOSE-CAPILLARY: 76 mg/dL (ref 65–99)
GLUCOSE-CAPILLARY: 415 mg/dL — AB (ref 65–99)
Glucose-Capillary: 82 mg/dL (ref 65–99)

## 2016-06-04 MED ORDER — INSULIN ASPART 100 UNIT/ML ~~LOC~~ SOLN: 4.0000 [IU] | Freq: Three times a day (TID) | SUBCUTANEOUS | 11 refills | Status: DC

## 2016-06-04 MED ORDER — LORAZEPAM 0.5 MG PO TABS: 0.5000 mg | ORAL_TABLET | ORAL | 0 refills | Status: AC | PRN

## 2016-06-04 MED ORDER — INSULIN DETEMIR 100 UNIT/ML ~~LOC~~ SOLN: 5.0000 [IU] | Freq: Every day | SUBCUTANEOUS | 11 refills | Status: AC

## 2016-06-04 MED ORDER — LEVOFLOXACIN 500 MG PO TABS: 500.0000 mg | ORAL_TABLET | ORAL | 0 refills | Status: AC

## 2016-06-04 NOTE — Progress Notes (Signed)
OT Cancellation Note  Patient Details Name: Marisa [name] MRN: [id number] DOB: [birth year]   Cancelled Treatment:     Noted pt to go back to SNF- will defer OT eval to SNF [facility] [place]  Payton Mccallum D 06/04/2016, 11:48 AM

## 2016-06-04 NOTE — Discharge Summary (Signed)
Physician Discharge Summary  Marisa [name] [id number] DOB: [birth year] DOA: 05/31/2016  PCP: Jani Gravel, MD  Admit date: 05/31/2016 Discharge date: 06/04/2016  Admitted From: SNF Disposition: SNF  Recommendations for Outpatient Follow-up:  1. Follow up with PCP in 1-2 weeks 2. Please obtain BMP/CBC in one week 3. Levofloxacin for 2 more doses  Home Health: NA Equipment/Devices:NA  Discharge Condition: Stable CODE STATUS: DNR Diet recommendation: Diet Carb Modified Fluid consistency: Thin; Room service appropriate? Yes Diet Carb Modified  Brief/Interim Summary: Marisa [name] a 76 y.o.femalewith medical history significant of recent stroke, discharge from [facility] to nursing home on 05/23/2016 after she was treated for stroke. Patient is lethargic, most of the history obtained from the EDP notes and daughter at bedside. Her daughter mentioned she was doing okay after she was discharged, was able to get up with the help of personnel and walker and actually woke all the way to the bathroom. For the past couple of days she started to be more lethargic, today she developed fever and her blood sugar was high so they brought her to the hospital for further evaluation. She was found to have sodium of 115, acute renal failure and fever of 101.8.  Discharge Diagnoses:  Principal Problem:   Sepsis ([place]) Active Problems:   Rheumatoid arthritis ([place])   Stroke ([place])   Uncontrolled type 2 diabetes mellitus with complication (HCC)   Hyponatremia   Acute renal failure (ARF) ([place])   Current chronic use of systemic steroids   Pressure injury of skin   Sepsis Presented with temp of 101.8, RR of 28, leukocytosis and suspicion of infection. Lactic acid was 4.1, improved to 0.9 after IV fluid hydration. Sepsis physiology resolved. No clear source at the time of discharge. Was on Zosyn and vancomycin for 3 days switched to Levaquin, 3 more days of Levaquin  empirically.  Severe hyponatremia Sodium is 115, corrects to 122 for glucose of 397. Was on normal saline at 75 mL per hour, sodium every 4 hours in the first 24 hours. Sodium improved to 132 on the day of discharge, IV fluids was continued. Recommended at least 1.5 L of fluids intake per day. This is discussed with the patient in the presence of her husband.  Hyperglycemia, insulin-dependent diabetes mellitus Presented with glucose of 397, does not have any evidence of ketosis or acidosis. Developed a hypoglycemic episode with CBG of 33 as patient received 28 units of NovoLog 10+9+9. On discharge Levemir insulin increased to 5 units at night and 4 units of NovoLog with meals.  Acute renal failure Creatinine is 1.79, based on BUN/cr ratio which is 22 this is appears to be secondary to dehydration/volume depletion. Patient given 1.5 L of normal saline as part of the sepsis protocol, and then started on NS at 75 mL/hour Creatinine is 0.99 on discharge. This is resolved.  Recent stroke On restarted aspirin and Plavix.  Current use of systemic steroids/rheumatoid arthritis Patient is on leflunomide and 10 mg of prednisone at home for rheumatoid arthritis. Although there was no low blood pressure low sodium and high potassium fits the picture of hypoadrenalism. Patient placed on Solu-Cortef 50 mg BID for 1 day and this is tapered down to 30 mg of prednisone prior to discharge. Discharge on her home dose of prednisone 10 mg daily.  Hypertension Blood pressure medications held except metoprolol to avoid rebound tachycardia. Blood pressure medications restarted on discharge.  Anemia Has chronic anemia, presented with hemoglobin of 8.4 and dropped  down to 7.1 on discharge. No evidence of bleeding, this is likely a hemodilution secondary to IV fluid administration. Follow CBC in the nursing home. Patient is on aspirin and Plavix. Continue omeprazole   Discharge  Instructions  Discharge Instructions    Diet Carb Modified    Complete by:  As directed    Increase activity slowly    Complete by:  As directed      Allergies as of 06/04/2016      Reactions   Demerol [meperidine] Other (See Comments)   Reaction:  Hallucinations   Penicillins Anaphylaxis, Other (See Comments)   Has patient had a PCN reaction causing immediate rash, facial/tongue/throat swelling, SOB or lightheadedness with hypotension: Yes Has patient had a PCN reaction causing severe rash involving mucus membranes or skin necrosis: No Has patient had a PCN reaction that required hospitalization No Has patient had a PCN reaction occurring within the last 10 years: No If all of the above answers are "NO", then may proceed with Cephalosporin use.   Enbrel [etanercept] Swelling, Other (See Comments)   Reaction:  Arm swelling   Humira [adalimumab] Swelling, Other (See Comments)   Reaction:  Arm swelling      Medication List    STOP taking these medications   divalproex 125 MG capsule Commonly known as:  DEPAKOTE SPRINKLE     TAKE these medications   acetaminophen 650 MG CR tablet Commonly known as:  TYLENOL Take 650 mg by mouth 3 (three) times daily.   amLODipine 10 MG tablet Commonly known as:  NORVASC Take 1 tablet (10 mg total) by mouth daily.   aspirin EC 325 MG tablet Take 325 mg by mouth daily.   citalopram 10 MG tablet Commonly known as:  CELEXA Take 1 tablet (10 mg total) by mouth daily.   clopidogrel 75 MG tablet Commonly known as:  PLAVIX Take 1 tablet (75 mg total) by mouth daily.   Co Q 10 100 MG Caps Take 100 mg by mouth daily.   dicyclomine 10 MG/5ML syrup Commonly known as:  BENTYL Take 5 mLs (10 mg total) by mouth 2 (two) times daily.   diphenhydrAMINE 25 MG tablet Commonly known as:  BENADRYL Take 25 mg by mouth every 8 (eight) hours as needed for allergies.   docusate sodium 100 MG capsule Commonly known as:  COLACE Take 1 capsule (100 mg  total) by mouth daily as needed for mild constipation.   donepezil 10 MG tablet Commonly known as:  ARICEPT Take 1 tablet (10 mg total) by mouth at bedtime.   estrogens (conjugated) 0.625 MG tablet Commonly known as:  PREMARIN Take 1 tablet (0.625 mg total) by mouth daily.   ESTROVEN ENERGY PO Take 1 capsule by mouth daily.   fluticasone 50 MCG/ACT nasal spray Commonly known as:  FLONASE Place 2 sprays into both nostrils daily as needed for allergies or rhinitis.   GLUCOSAMINE & FISH OIL PO Take 1 capsule by mouth 2 (two) times daily.   insulin aspart 100 UNIT/ML injection Commonly known as:  novoLOG Inject 4 Units into the skin 3 (three) times daily with meals. What changed:  how much to take   insulin detemir 100 UNIT/ML injection Commonly known as:  LEVEMIR Inject 0.05 mLs (5 Units total) into the skin at bedtime. What changed:  how much to take   INTEGRA 62.5-62.5-40-3 MG Caps Take 1 capsule by mouth 2 (two) times daily.   ipratropium-albuterol 0.5-2.5 (3) MG/3ML Soln Commonly known as:  DUONEB  Take 3 mLs by nebulization every 6 (six) hours as needed (for wheezing/shortness of breath).   leflunomide 20 MG tablet Commonly known as:  ARAVA Take 1 tablet (20 mg total) by mouth daily.   levalbuterol 45 MCG/ACT inhaler Commonly known as:  XOPENEX HFA Inhale 2 puffs into the lungs every 4 (four) hours as needed for wheezing or shortness of breath.   levofloxacin 500 MG tablet Commonly known as:  LEVAQUIN Take 1 tablet (500 mg total) by mouth every other day. Start taking on:  06/05/2016   lisinopril 5 MG tablet Commonly known as:  PRINIVIL,ZESTRIL Take 1 tablet (5 mg total) by mouth daily.   LORazepam 0.5 MG tablet Commonly known as:  ATIVAN Take 1 tablet (0.5 mg total) by mouth every 4 (four) hours as needed for anxiety.   metoprolol 100 MG tablet Commonly known as:  LOPRESSOR Take 1 tablet (100 mg total) by mouth 2 (two) times daily.   multivitamin with  minerals Tabs tablet Take 1 tablet by mouth daily.   omeprazole 20 MG capsule Commonly known as:  PRILOSEC Take 20 mg by mouth daily.   polyvinyl alcohol 1.4 % ophthalmic solution Commonly known as:  LIQUIFILM TEARS Place 1 drop into both eyes as needed for dry eyes.   pravastatin 20 MG tablet Commonly known as:  PRAVACHOL Take 1 tablet (20 mg total) by mouth daily at 6 PM.   predniSONE 10 MG tablet Commonly known as:  DELTASONE Take 1 tablet (10 mg total) by mouth daily.   simethicone 40 MG/0.6ML drops Commonly known as:  MYLICON Take 1.2 mLs (80 mg total) by mouth 4 (four) times daily.   vitamin C 500 MG tablet Commonly known as:  ASCORBIC ACID Take 500 mg by mouth 2 (two) times daily.   Vitamin K2 100 MCG Caps Take 100 mcg by mouth daily.      Contact information for after-discharge care    Destination    [facility] .   Specialty:  Skilled Nursing Facility Contact information: [place] [address] [phone]             Allergies  Allergen Reactions  . Demerol [Meperidine] Other (See Comments)    Reaction:  Hallucinations  . Penicillins Anaphylaxis and Other (See Comments)    Has patient had a PCN reaction causing immediate rash, facial/tongue/throat swelling, SOB or lightheadedness with hypotension: Yes Has patient had a PCN reaction causing severe rash involving mucus membranes or skin necrosis: No Has patient had a PCN reaction that required hospitalization No Has patient had a PCN reaction occurring within the last 10 years: No If all of the above answers are "NO", then may proceed with Cephalosporin use.  . Enbrel [Etanercept] Swelling and Other (See Comments)    Reaction:  Arm swelling   . Humira [Adalimumab] Swelling and Other (See Comments)    Reaction:  Arm swelling     Consultations:  Nephrology   Procedures (Echo, Carotid, EGD, Colonoscopy, ERCP)   Radiological studies: Ct Angio Head W Or Wo  Contrast  Result Date: 05/14/2016 CLINICAL DATA:  Left-sided weakness.  Left sixth nerve palsy. EXAM: CT ANGIOGRAPHY HEAD AND NECK TECHNIQUE: Multidetector CT imaging of the head and neck was performed using the standard protocol during bolus administration of intravenous contrast. Multiplanar CT image reconstructions and MIPs were obtained to evaluate the vascular anatomy. Carotid stenosis measurements (when applicable) are obtained utilizing NASCET criteria, using the distal internal carotid diameter as the denominator.  CONTRAST:  50 mL Isovue 370 COMPARISON:  Noncontrast head CT earlier today and MRI 05/14/2015. Cervical spine MRI 08/19/2015. No prior angiographic imaging. FINDINGS: CTA NECK FINDINGS Aortic arch: 3 vessel aortic arch with moderate atherosclerotic plaque. Dilated distal aortic arch measuring 3.4 cm in diameter. Partially visualized aortic dissection in the proximal descending aorta. Eccentric calcified plaque in the proximal left subclavian artery results in less than 50% narrowing. Right carotid system: Medialized course of the common and proximal internal carotid arteries. Mild atherosclerotic plaque at the carotid bifurcation without stenosis. No evidence of dissection. Left carotid system: Medialized course of the common and proximal internal carotid arteries. No stenosis or evidence of dissection. Vertebral arteries: The right vertebral artery is patent proximally with mild-to-moderate proximal V1 stenosis. Additional mild atherosclerotic narrowing is present in the right V2 segment, and the right vertebral artery occludes at the C2 level with distal reconstitution of the distal V3 and V4 segments. The left vertebral artery is either markedly compressed (and not visualize) or occluded over a short segment at the level of the C3 transverse foramen. The left vertebral artery is patent proximal and distal to this, however there is a caliber change with mild smooth narrowing at the C1-2 level  followed by a return to a normal caliber more distally in the the V3 segment. Skeleton: Abnormal cervical alignment as demonstrated on prior MRI. There is 8 mm anterior subluxation of C3 on C4 with appearance suggesting perched, fused facets on the left. There is C4 and C5 vertebral body height loss, and there is fusion between the vertebral bodies from C3-C6 which may reflect posttraumatic degenerative change. There is prominent focal kyphosis centered at C4, and there is slight anterolisthesis of C2 on C3. Mild erosive changes are noted involving the dens. Other neck: No neck mass or lymph node enlargement. Upper chest: Minimal pleural-parenchymal scarring in the lung apices. Review of the MIP images confirms the above findings CTA HEAD FINDINGS Anterior circulation: The internal carotid arteries are patent from skullbase to carotid termini with bilateral siphon atherosclerosis resulting in mild to moderate luminal irregularity. There is mild right ICA stenosis near the petrous - cavernous junction. An infundibulum is noted at the left posterior communicating origin. Anterior communicating artery is unremarkable. ACAs and MCAs are patent without evidence of major branch occlusion or significant proximal stenosis. No aneurysm. Posterior circulation: Intracranial vertebral arteries are patent without focal stenosis. Left PICA, bilateral AICA, and bilateral SCA origins are patent. Basilar artery is widely patent. There are small posterior communicating arteries bilaterally. The PCAs are patent without evidence of significant stenosis. No aneurysm. Venous sinuses: Patent. Anatomic variants: None. Delayed phase: No abnormal enhancement. Review of the MIP images confirms the above findings IMPRESSION: 1. Partially visualized type B aortic dissection. 2. No major intracranial arterial occlusion. Mild intracranial right ICA stenosis. 3. Partial occlusion of the right V3 segment with distal reconstitution. 4. Severe  osseous compression of the left vertebral artery at the C3 level versus short segment occlusion secondary to chronic malalignment and osseous deformities, most likely related to remote trauma. 5. Mild narrowing of the left V3 segment compatible with short segment dissection, also potentially related to prior trauma although an acute dissection cannot be entirely excluded given the lack of prior angiographic studies for comparison. 6. No cervical carotid artery stenosis. These results were called by telephone at the time of interpretation on 05/14/2016 at 1:56 pm to Dr. Merrily Pew , who verbally acknowledged these results. Electronically Signed   By:  Logan Bores M.D.   On: 05/14/2016 14:00   Ct Head Wo Contrast  Result Date: 05/31/2016 CLINICAL DATA:  76 y/o  F; altered mental status. EXAM: CT HEAD WITHOUT CONTRAST TECHNIQUE: Contiguous axial images were obtained from the base of the skull through the vertex without intravenous contrast. COMPARISON:  05/14/2016 MRI of the brain. 05/14/2016 CT of the head. FINDINGS: Brain: No evidence of acute infarction, hemorrhage, hydrocephalus, extra-axial collection or mass lesion/mass effect. Stable small chronic infarcts in right frontal white matter and the left cerebellar hemisphere. Stable moderate chronic microvascular ischemic changes and mild parenchymal volume loss of the brain. Vascular: Calcific atherosclerosis of cavernous and paraclinoid internal carotid arteries. Skull: Normal. Negative for fracture or focal lesion. Sinuses/Orbits: No acute finding. Other: Bilateral intra-ocular lens replacement. IMPRESSION: 1. No acute intracranial abnormality is identified. 2. Stable moderate chronic microvascular ischemic changes and mild parenchymal volume loss of the brain. Stable chronic infarcts in right frontal white matter and left cerebellar hemisphere. Electronically Signed   By: Kristine Garbe M.D.   On: 05/31/2016 15:49   Ct Head Wo Contrast  Result  Date: 05/14/2016 CLINICAL DATA:  Marisa [name] brought in by EMS due to having blurry vision in right eye, abnormal gait, and leaning to the left EXAM: CT HEAD WITHOUT CONTRAST TECHNIQUE: Contiguous axial images were obtained from the base of the skull through the vertex without intravenous contrast. COMPARISON:  None MRI 05/14/2015 FINDINGS: Brain: No acute intracranial hemorrhage. No focal mass lesion. No CT evidence of acute infarction. No midline shift or mass effect. No hydrocephalus. Basilar cisterns are patent. Remote infarction in the LEFT cerebellum (image 9, series 3). Deep white matter infarction in the RIGHT frontal lobe also unchanged. Periventricular and subcortical white matter hypodensities. Generalized cortical atrophy. Vascular: Rim Skull: Normal. Negative for fracture or focal lesion. Sinuses/Orbits: Paranasal sinuses and mastoid air cells are clear. Orbits are clear. Other: None. IMPRESSION: 1. No acute intracranial findings. 2. Chronic atrophy and white matter microvascular disease. 3. Remote cerebellar infarct. Electronically Signed   By: Suzy Bouchard M.D.   On: 05/14/2016 11:44   Ct Angio Neck W And/or Wo Contrast  Result Date: 05/14/2016 CLINICAL DATA:  Left-sided weakness.  Left sixth nerve palsy. EXAM: CT ANGIOGRAPHY HEAD AND NECK TECHNIQUE: Multidetector CT imaging of the head and neck was performed using the standard protocol during bolus administration of intravenous contrast. Multiplanar CT image reconstructions and MIPs were obtained to evaluate the vascular anatomy. Carotid stenosis measurements (when applicable) are obtained utilizing NASCET criteria, using the distal internal carotid diameter as the denominator. CONTRAST:  50 mL Isovue 370 COMPARISON:  Noncontrast head CT earlier today and MRI 05/14/2015. Cervical spine MRI 08/19/2015. No prior angiographic imaging. FINDINGS: CTA NECK FINDINGS Aortic arch: 3 vessel aortic arch with moderate atherosclerotic plaque. Dilated distal aortic  arch measuring 3.4 cm in diameter. Partially visualized aortic dissection in the proximal descending aorta. Eccentric calcified plaque in the proximal left subclavian artery results in less than 50% narrowing. Right carotid system: Medialized course of the common and proximal internal carotid arteries. Mild atherosclerotic plaque at the carotid bifurcation without stenosis. No evidence of dissection. Left carotid system: Medialized course of the common and proximal internal carotid arteries. No stenosis or evidence of dissection. Vertebral arteries: The right vertebral artery is patent proximally with mild-to-moderate proximal V1 stenosis. Additional mild atherosclerotic narrowing is present in the right V2 segment, and the right vertebral artery occludes at the C2 level with distal reconstitution of the distal V3  and V4 segments. The left vertebral artery is either markedly compressed (and not visualize) or occluded over a short segment at the level of the C3 transverse foramen. The left vertebral artery is patent proximal and distal to this, however there is a caliber change with mild smooth narrowing at the C1-2 level followed by a return to a normal caliber more distally in the the V3 segment. Skeleton: Abnormal cervical alignment as demonstrated on prior MRI. There is 8 mm anterior subluxation of C3 on C4 with appearance suggesting perched, fused facets on the left. There is C4 and C5 vertebral body height loss, and there is fusion between the vertebral bodies from C3-C6 which may reflect posttraumatic degenerative change. There is prominent focal kyphosis centered at C4, and there is slight anterolisthesis of C2 on C3. Mild erosive changes are noted involving the dens. Other neck: No neck mass or lymph node enlargement. Upper chest: Minimal pleural-parenchymal scarring in the lung apices. Review of the MIP images confirms the above findings CTA HEAD FINDINGS Anterior circulation: The internal carotid arteries  are patent from skullbase to carotid termini with bilateral siphon atherosclerosis resulting in mild to moderate luminal irregularity. There is mild right ICA stenosis near the petrous - cavernous junction. An infundibulum is noted at the left posterior communicating origin. Anterior communicating artery is unremarkable. ACAs and MCAs are patent without evidence of major branch occlusion or significant proximal stenosis. No aneurysm. Posterior circulation: Intracranial vertebral arteries are patent without focal stenosis. Left PICA, bilateral AICA, and bilateral SCA origins are patent. Basilar artery is widely patent. There are small posterior communicating arteries bilaterally. The PCAs are patent without evidence of significant stenosis. No aneurysm. Venous sinuses: Patent. Anatomic variants: None. Delayed phase: No abnormal enhancement. Review of the MIP images confirms the above findings IMPRESSION: 1. Partially visualized type B aortic dissection. 2. No major intracranial arterial occlusion. Mild intracranial right ICA stenosis. 3. Partial occlusion of the right V3 segment with distal reconstitution. 4. Severe osseous compression of the left vertebral artery at the C3 level versus short segment occlusion secondary to chronic malalignment and osseous deformities, most likely related to remote trauma. 5. Mild narrowing of the left V3 segment compatible with short segment dissection, also potentially related to prior trauma although an acute dissection cannot be entirely excluded given the lack of prior angiographic studies for comparison. 6. No cervical carotid artery stenosis. These results were called by telephone at the time of interpretation on 05/14/2016 at 1:56 pm to Dr. Merrily Pew , who verbally acknowledged these results. Electronically Signed   By: Logan Bores M.D.   On: 05/14/2016 14:00   Mr Virgel Paling [id number] Contrast  Result Date: 05/14/2016 CLINICAL DATA:  Blurry vision in right eye and left-sided  weakness. EXAM: MRI HEAD WITHOUT CONTRAST MRA HEAD WITHOUT CONTRAST TECHNIQUE: Axial and coronal diffusion weighted sequences and axial time-of-flight MRA of the brain was performed. COMPARISON:  05/14/2016 CT angiogram head and neck. FINDINGS: MRI HEAD FINDINGS Small foci of diffusion restriction within the right occipital lobe, right paramedian midbrain, and scattered in bilateral cerebellar hemispheres compatible with acute/early subacute infarction. MRA HEAD FINDINGS Severe motion artifact. Suboptimal evaluation for stenosis or aneurysm. Bilateral vertebral arteries and the basilar artery are patent. Bilateral internal carotid arteries, bilateral middle cerebral arteries, and bilateral anterior cerebral arteries are patent proximally. IMPRESSION: 1. 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 2. Severe motion artifact of time-of-flight MRA. No new proximal large  vessel occlusion identified. These results will be called to the ordering clinician or representative by the Radiologist Assistant, and communication documented in the PACS or zVision Dashboard. Electronically Signed   By: Kristine Garbe M.D.   On: 05/14/2016 17:57   Dg Chest Port 1 View  Result Date: 05/31/2016 CLINICAL DATA:  Altered mental status EXAM: PORTABLE CHEST 1 VIEW COMPARISON:  01/19/2016 FINDINGS: Heart size normal. Ectasia of the thoracic aorta. Negative for heart failure. Negative for pneumonia or effusion. Chronic right-sided rib fractures. Apical scarring bilaterally. Chronic shoulder arthropathy bilaterally. IMPRESSION: No active disease. Electronically Signed   By: Franchot Gallo M.D.   On: 05/31/2016 15:38   Mr Brain Limited Wo Contrast  Result Date: 05/14/2016 CLINICAL DATA:  Blurry vision in right eye and left-sided weakness. EXAM: MRI HEAD WITHOUT CONTRAST MRA HEAD WITHOUT CONTRAST TECHNIQUE: Axial and coronal diffusion  weighted sequences and axial time-of-flight MRA of the brain was performed. COMPARISON:  05/14/2016 CT angiogram head and neck. FINDINGS: MRI HEAD FINDINGS Small foci of diffusion restriction within the right occipital lobe, right paramedian midbrain, and scattered in bilateral cerebellar hemispheres compatible with acute/early subacute infarction. MRA HEAD FINDINGS Severe motion artifact. Suboptimal evaluation for stenosis or aneurysm. Bilateral vertebral arteries and the basilar artery are patent. Bilateral internal carotid arteries, bilateral middle cerebral arteries, and bilateral anterior cerebral arteries are patent proximally. IMPRESSION: 1. 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 2. Severe motion artifact of time-of-flight MRA. No new proximal large vessel occlusion identified. These results will be called to the ordering clinician or representative by the Radiologist Assistant, and communication documented in the PACS or zVision Dashboard. Electronically Signed   By: Kristine Garbe M.D.   On: 05/14/2016 17:57   Ct Angio Chest/abd/pel For Dissection W And/or W/wo  Result Date: 05/16/2016 CLINICAL DATA:  Aortic dissection EXAM: CT ANGIOGRAPHY CHEST, ABDOMEN AND PELVIS TECHNIQUE: Multidetector CT imaging through the chest, abdomen and pelvis was performed using the standard protocol during bolus administration of intravenous contrast. Multiplanar reconstructed images and MIPs were obtained and reviewed to evaluate the vascular anatomy. CONTRAST:  60 cc Isovue 370 COMPARISON:  None. FINDINGS: CTA CHEST FINDINGS Cardiovascular: There is no evidence of intramural hematoma. There is no evidence of dissection in the ascending aorta. 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 There is no evidence of aortic rupture. There is a small dissection flaps  that is 10 mm in vertical length. 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 Maximal diameter of the aorta at this segment is 3.4 cm. The great vessels are patent. There is no obvious pulmonary thromboembolism. Mediastinum/Nodes: There is no evidence of abnormal mediastinal adenopathy. Thyroid is unremarkable. Has offered this is unremarkable. No pericardial effusion. Lungs/Pleura: There is scattered atelectasis in the lungs. No pneumothorax. No pleural effusion. Musculoskeletal: Displaced acute right seventh, eighth, ninth, and tenth rib fractures. There is healing at some of these fractures. The right sixth rib fracture has a subacute appearance. No vertebral compression deformity in the thoracic spine. Review of the MIP images confirms the above findings. CTA ABDOMEN AND PELVIS FINDINGS VASCULAR Aorta: No evidence of aortic aneurysm or dissection within the abdomen. Aorta is patent. Celiac: Patent. Mild atherosclerotic changes at the origin. Accessory left hepatic artery anatomy. SMA: Patent origin with atherosclerotic calcification. Renals: Single right renal artery is patent. There are 2 left renal arteries. The dominant left renal artery is patent. The more  diminutive left renal artery is grossly patent, however the origin is somewhat obscured. IMA: IMA is diminutive and patent. Inflow: There are atherosclerotic changes with some irregular plaque at the aortic bifurcation and origin of the common iliac arteries. There are diffuse atherosclerotic changes of the right common iliac artery without significant narrowing. Right external iliac and internal iliac arteries are patent. Atherosclerotic changes of the left common iliac artery are present without significant narrowing. Left internal and external iliac arteries are patent. Veins: Portal vein is patent. Splenic vein is patent. Renal veins are suboptimally opacified. Review of the MIP images confirms the above findings. NON-VASCULAR Hepatobiliary: Contour of  the liver is somewhat nodular. The left lobe is prominent. This appearance has slightly progressed since the prior study. No focal mass. Pancreas: Pancreas is unremarkable. Spleen: Several calcified granulomata are present in the spleen. Adrenals/Urinary Tract: Chronic changes of the kidneys. Simple cysts in the left kidney on image 141 of series 6. Adrenal glands are within normal limits. The bladder is very distended. Stomach/Bowel: Small hiatal hernia. No evidence of small-bowel obstruction. No focal mass in the colon. Diverticulosis of the colon. Moderate stool burden in the rectum. Lymphatic: There is no evidence of retroperitoneal adenopathy. Reproductive: Uterus is absent.  Adnexa are within normal limits. Other: There is no free fluid. Musculoskeletal: There are healing fractures of the superior and inferior pubic rami. There are severe degenerative changes in the lumbar spine. New L1 compression fracture is present with slight retropulsion of the superior endplate and [id number] loss height anteriorly. Levoscoliosis at L3 is present. Review of the MIP images confirms the above findings. 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 If the patient is hypertensive, aggressive blood pressure control is recommended. No evidence of dissection or aneurysm within the abdominal aorta. Atherosclerotic changes are noted. 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 Multiple right-sided rib fractures as described. Some are healing. The right sixth rib fracture has a subacute appearance. L1 compression fracture of indeterminate age. Electronically Signed   By: Marybelle Killings M.D.   On: 05/16/2016 08:30     Subjective:  Discharge Exam: Vitals:   06/03/16 0531 06/03/16 1230 06/03/16 2005 06/04/16 0528  BP: (!) 142/62 (!) 141/67 (!) 143/54 (!) 163/57  Pulse: 71 79 86 76  Resp: 18 18 20 16   Temp: 98.1 F (36.7 C) 98.4 F (36.9 C) 98.8 F (37.1  C)   TempSrc: Oral Oral Oral   SpO2: 100% 100% 100% 100%  Weight:      Height:       General: Marisa [name] is alert, awake, not in acute distress Cardiovascular: RRR, S1/S2 +, no rubs, no gallops Respiratory: CTA bilaterally, no wheezing, no rhonchi Abdominal: Soft, NT, ND, bowel sounds + Extremities: no edema, no cyanosis   The results of significant diagnostics from this hospitalization (including imaging, microbiology, ancillary and laboratory) are listed below for reference.    Microbiology: Recent Results (from the past 240 hour(s))  Blood Culture (routine x 2)     Status: None (Preliminary result)   Collection Time: 05/31/16  4:07 PM  Result Value Ref Range Status   Specimen Description RIGHT ANTECUBITAL  Final   Special Requests BOTTLES DRAWN AEROBIC AND ANAEROBIC 5CC  Final   Culture   Final    NO GROWTH 3 DAYS Performed at [facility], [address] [place], [place] [address]    Report Status PENDING  Incomplete  MRSA  PCR Screening     Status: None   Collection Time: 05/31/16  6:26 PM  Result Value Ref Range Status   MRSA by PCR NEGATIVE NEGATIVE Final    Comment:        The GeneXpert MRSA Assay (FDA approved for NASAL specimens only), is one component of a comprehensive MRSA colonization surveillance program. It is not intended to diagnose MRSA infection nor to guide or monitor treatment for MRSA infections.   Blood Culture (routine x 2)     Status: None (Preliminary result)   Collection Time: 05/31/16  7:27 PM  Result Value Ref Range Status   Specimen Description BLOOD LEFT HAND  Final   Special Requests IN PEDIATRIC BOTTLE 1CC  Final   Culture   Final    NO GROWTH 3 DAYS Performed at [facility], [place] [address] [place], [place] [address]    Report Status PENDING  Incomplete  Urine culture     Status: None   Collection Time: 06/01/16  3:36 AM  Result Value Ref Range Status   Specimen Description URINE, CATHETERIZED  Final   Special Requests  NONE  Final   Culture   Final    NO GROWTH Performed at [facility], [address] [place], [place] [address]    Report Status 06/02/2016 FINAL  Final     Labs: BNP (last 3 results) No results for input(s): BNP in the last 8760 hours. Basic Metabolic Panel:  Recent Labs Lab 06/01/16 1823 06/01/16 2254 06/02/16 0343 06/03/16 0507 06/04/16 0501  NA 126* 127* 127* 130* 132*  K 4.5 5.1 5.1 4.8 4.6  CL 93* 95* 96* 98* 102  CO2 24 25 25 27 26   GLUCOSE 109* 168* 180* 135* 75  BUN 25* 24* 23* 20 19  CREATININE 1.25* 1.17* 1.24* 1.29* 0.99  CALCIUM 7.7* 7.6* 7.7* 7.8* 7.7*   Liver Function Tests:  Recent Labs Lab 05/31/16 1505  AST 23  ALT 18  ALKPHOS 105  BILITOT 0.7  PROT 5.8*  ALBUMIN 3.2*   No results for input(s): LIPASE, AMYLASE in the last 168 hours. No results for input(s): AMMONIA in the last 168 hours. CBC:  Recent Labs Lab 05/31/16 1505 06/01/16 0740 06/02/16 0343 06/03/16 0507 06/04/16 0501  WBC 14.2* 15.2* 12.7* 9.5 7.8  HGB 8.4* 7.6* 8.2* 7.6* 7.1*  HCT 23.2* 22.1* 23.2* 22.1* 20.6*  MCV 82.0 84.0 83.8 85.7 86.2  PLT 361 273 310 366 309   Cardiac Enzymes:  Recent Labs Lab 06/02/16 1321  TROPONINI <0.03   BNP: Invalid input(s): POCBNP CBG:  Recent Labs Lab 06/03/16 1746 06/03/16 1936 06/03/16 2134 06/04/16 0259 06/04/16 0721  GLUCAP 134* 205* 138* 82 76   D-Dimer No results for input(s): DDIMER in the last 72 hours. Hgb A1c No results for input(s): HGBA1C in the last 72 hours. Lipid Profile No results for input(s): CHOL, HDL, LDLCALC, TRIG, CHOLHDL, LDLDIRECT in the last 72 hours. Thyroid function studies No results for input(s): TSH, T4TOTAL, T3FREE, THYROIDAB in the last 72 hours.  Invalid input(s): FREET3 Anemia work up No results for input(s): VITAMINB12, FOLATE, FERRITIN, TIBC, IRON, RETICCTPCT in the last 72 hours. Urinalysis    Component Value Date/Time   COLORURINE STRAW (A) 06/01/2016 0336    APPEARANCEUR CLEAR 06/01/2016 0336   LABSPEC 1.007 06/01/2016 0336   PHURINE 5.0 06/01/2016 0336   GLUCOSEU >=500 (A) 06/01/2016 0336   HGBUR NEGATIVE 06/01/2016 0336   BILIRUBINUR NEGATIVE 06/01/2016 0336  KETONESUR 20 (A) 06/01/2016 0336   PROTEINUR NEGATIVE 06/01/2016 0336   UROBILINOGEN 0.2 05/28/2014 1021   NITRITE NEGATIVE 06/01/2016 0336   LEUKOCYTESUR NEGATIVE 06/01/2016 0336   Sepsis Labs Invalid input(s): PROCALCITONIN,  WBC,  LACTICIDVEN Microbiology Recent Results (from the past 240 hour(s))  Blood Culture (routine x 2)     Status: None (Preliminary result)   Collection Time: 05/31/16  4:07 PM  Result Value Ref Range Status   Specimen Description RIGHT ANTECUBITAL  Final   Special Requests BOTTLES DRAWN AEROBIC AND ANAEROBIC 5CC  Final   Culture   Final    NO GROWTH 3 DAYS Performed at [facility], [place] [address], [place], [place] [address]    Report Status PENDING  Incomplete  MRSA PCR Screening     Status: None   Collection Time: 05/31/16  6:26 PM  Result Value Ref Range Status   MRSA by PCR NEGATIVE NEGATIVE Final    Comment:        The GeneXpert MRSA Assay (FDA approved for NASAL specimens only), is one component of a comprehensive MRSA colonization surveillance program. It is not intended to diagnose MRSA infection nor to guide or monitor treatment for MRSA infections.   Blood Culture (routine x 2)     Status: None (Preliminary result)   Collection Time: 05/31/16  7:27 PM  Result Value Ref Range Status   Specimen Description BLOOD LEFT HAND  Final   Special Requests IN PEDIATRIC BOTTLE 1CC  Final   Culture   Final    NO GROWTH 3 DAYS Performed at [facility], [place] [address], [place], [place] [address]    Report Status PENDING  Incomplete  Urine culture     Status: None   Collection Time: 06/01/16  3:36 AM  Result Value Ref Range Status   Specimen Description URINE, CATHETERIZED  Final   Special Requests NONE  Final   Culture    Final    NO GROWTH Performed at [facility], [address], [place], [place] [address]    Report Status 06/02/2016 FINAL  Final     Time coordinating discharge: Over 30 minutes  SIGNED:   Birdie Hopes, MD  [facility] 06/04/2016, 10:28 AM Pager   If 7PM-7AM, please contact night-coverage [email or web address] Password [email or web address]

## 2016-06-04 NOTE — Care Management Note (Signed)
Case Management Note  Patient Details  Name: Marisa [name] MRN: [id number] Date of Birth: 11/30/1940  Subjective/Objective:                    Action/Plan:dc SNF.   Expected Discharge Date:  06/04/16               Expected Discharge Plan:  Skilled Nursing Facility  In-House Referral:  Clinical Social Work  Discharge planning Services     Post Acute Care Choice:    Choice offered to:     DME Arranged:    DME Agency:     HH Arranged:    [place] Agency:     Status of Service:  Completed, signed off  If discussed at [employer], dates discussed:    Additional Comments:  Dessa Phi, RN 06/04/2016, 11:13 AM

## 2016-06-04 NOTE — Clinical Social Work Placement (Signed)
Patient is set to discharge to [facility] today. Patient & husband, [name] made aware. Discharge packet given to RN, Hinton Dyer. [facility] called for transport.     Raynaldo Opitz, [facility] Clinical Social Worker cell #: [phone]    CLINICAL SOCIAL WORK PLACEMENT  NOTE  Date:  06/04/2016  Patient Details  Name: Marisa [name] MRN: [id number] Date of Birth: 11/06/1940  Clinical Social Work is seeking post-discharge placement for this patient at the [place] level of care (*CSW will initial, date and re-position this form in  chart as items are completed):  Yes   Patient/family provided with [facility] Work Department's list of facilities offering this level of care within the geographic area requested by the patient (or if unable, by the patient's family).  Yes   Patient/family informed of their freedom to choose among providers that offer the needed level of care, that participate in Medicare, Medicaid or managed care program needed by the patient, have an available bed and are willing to accept the patient.  Yes   Patient/family informed of [facility]'s ownership interest in [facility] and [facility], as well as of the fact that they are under no obligation to receive care at these facilities.  PASRR submitted to EDS on       PASRR number received on       Existing PASRR number confirmed on 06/03/16     FL2 transmitted to all facilities in geographic area requested by pt/family on 06/03/16     FL2 transmitted to all facilities within larger geographic area on       Patient informed that his/her managed care company has contracts with or will negotiate with certain facilities, including the following:        Yes   Patient/family informed of bed offers received.  Patient chooses bed at [facility]     Physician recommends and patient chooses bed at      Patient to be transferred to [facility] on  06/04/16.  Patient to be transferred to facility by [facility]     Patient family notified on 06/04/16 of transfer.  Name of family member notified:  patient's husband, [name] via phone     PHYSICIAN       Additional Comment:    _______________________________________________ Standley Brooking, LCSW 06/04/2016, 1:44 PM

## 2016-06-04 NOTE — Care Management Important Message (Signed)
Important Message  Patient Details  Name: MALISSIE SCHOLZ MRN: [id number] Date of Birth: 1940/10/17   Medicare Important Message Given:       Kerin Salen 06/04/2016, 10:22 [place] Message  Patient Details  Name: ZOBIA MATTERS MRN: [id number] Date of Birth: [birth year]   Medicare Important Message Given:       Kerin Salen 06/04/2016, 10:20 AM

## 2016-06-04 NOTE — Progress Notes (Signed)
At 2030, patient was complaining of 8/10 pain in abdomen. Stated to felt like she had to urinate but was unable to. Bladder scan done and it showed 500cc. On call made aware and new order was given for an in and out. In and out was done and once it was done patient had no pain. Around 2300, patient stated she was feeling pressure again in her abdomen, but was unable to urinate. Bladder scan showed over 400cc. On call made aware and new order was given for a foley. Foley placed in patient and now patient has no pain.

## 2016-06-04 NOTE — Progress Notes (Signed)
Report called to Kathlee Nations at [facility]. Review health history and med list.Marisa [name], Marisa [name]

## 2016-06-05 ENCOUNTER — Non-Acute Institutional Stay (SKILLED_NURSING_FACILITY): Payer: Medicare Other | Admitting: Adult Health

## 2016-06-05 ENCOUNTER — Encounter: Payer: Self-pay | Admitting: Adult Health

## 2016-06-05 ENCOUNTER — Ambulatory Visit: Payer: Self-pay | Admitting: Adult Health

## 2016-06-05 DIAGNOSIS — M069 Rheumatoid arthritis, unspecified: Secondary | ICD-10-CM

## 2016-06-05 DIAGNOSIS — K580 Irritable bowel syndrome with diarrhea: Secondary | ICD-10-CM

## 2016-06-05 DIAGNOSIS — E871 Hypo-osmolality and hyponatremia: Secondary | ICD-10-CM

## 2016-06-05 DIAGNOSIS — F329 Major depressive disorder, single episode, unspecified: Secondary | ICD-10-CM

## 2016-06-05 DIAGNOSIS — D500 Iron deficiency anemia secondary to blood loss (chronic): Secondary | ICD-10-CM

## 2016-06-05 DIAGNOSIS — E119 Type 2 diabetes mellitus without complications: Secondary | ICD-10-CM

## 2016-06-05 DIAGNOSIS — E785 Hyperlipidemia, unspecified: Secondary | ICD-10-CM

## 2016-06-05 DIAGNOSIS — I63213 Cerebral infarction due to unspecified occlusion or stenosis of bilateral vertebral arteries: Secondary | ICD-10-CM

## 2016-06-05 DIAGNOSIS — A419 Sepsis, unspecified organism: Secondary | ICD-10-CM

## 2016-06-05 DIAGNOSIS — K219 Gastro-esophageal reflux disease without esophagitis: Secondary | ICD-10-CM

## 2016-06-05 DIAGNOSIS — F419 Anxiety disorder, unspecified: Secondary | ICD-10-CM

## 2016-06-05 DIAGNOSIS — J309 Allergic rhinitis, unspecified: Secondary | ICD-10-CM

## 2016-06-05 DIAGNOSIS — I10 Essential (primary) hypertension: Secondary | ICD-10-CM | POA: Diagnosis not present

## 2016-06-05 DIAGNOSIS — Z794 Long term (current) use of insulin: Secondary | ICD-10-CM

## 2016-06-05 DIAGNOSIS — R5381 Other malaise: Secondary | ICD-10-CM | POA: Diagnosis not present

## 2016-06-05 DIAGNOSIS — R1310 Dysphagia, unspecified: Secondary | ICD-10-CM

## 2016-06-05 DIAGNOSIS — F0391 Unspecified dementia with behavioral disturbance: Secondary | ICD-10-CM

## 2016-06-05 LAB — CULTURE, BLOOD (ROUTINE X 2)
CULTURE: NO GROWTH
CULTURE: NO GROWTH

## 2016-06-05 NOTE — Progress Notes (Signed)
DATE:  06/05/2016   MRN:  [id number]  BIRTHDAY: 07/19/1940  Facility:  Nursing Home Location:  [facility] Room Number: 1006-A  LEVEL OF CARE:  SNF (31)  Contact Information    Name Relation Home Work Shelley Spouse [phone]  [phone]   Ramsay,Suzanne Daughter   [phone]   Katherene Ponto   [phone]       Code Status History    Date Active Date Inactive Code Status Order ID Comments User Context   05/31/2016  6:31 PM 06/04/2016  7:03 PM DNR [id number]  Verlee Monte, MD Inpatient   05/15/2016  2:46 AM 05/23/2016  8:53 PM Full Code [id number]  Samella Parr, NP Inpatient   05/14/2016 11:13 PM 05/15/2016  2:46 AM Full Code [id number]  Jeryl Columbia, NP ED   01/18/2016  2:59 PM 01/22/2016  7:48 PM DNR [id number]  Oswald Hillock, MD ED   04/07/2015  4:07 PM 04/08/2015  5:27 PM Full Code [id number]  Donne Hazel, MD Inpatient   05/28/2014  8:22 AM 05/30/2014  8:05 PM Full Code [id number]  Melton Alar, PA-C Inpatient   04/13/2014 10:04 PM 04/14/2014  4:25 PM Full Code [id number]  Rise Patience, MD Inpatient   01/04/2014  5:21 PM 01/12/2014  4:38 PM Full Code [id number]  Hosie Poisson, MD Inpatient   01/02/2014  6:42 AM 01/04/2014  5:21 PM Full Code [id number]  Varney Biles, MD ED   02/10/2013  7:49 PM 02/12/2013  4:04 PM Full Code [id number]  Delfina Redwood, MD Inpatient   01/04/2013 10:59 PM 01/07/2013  2:39 PM Full Code [id number]  Berle Mull, MD Inpatient   02/26/2012  6:29 PM 02/27/2012  5:59 PM Full Code [id number]  Orvil Feil, RN Inpatient   09/29/2011 11:34 PM 10/08/2011  8:08 PM Full Code [id number]  Milta Deiters, RN Inpatient    Questions for Most Recent Historical Code Status (Order [id number])    Question Answer Comment   In the event of cardiac or respiratory ARREST Do not call a "code blue"    In the event of cardiac or respiratory ARREST Do not perform Intubation, CPR, defibrillation or ACLS    In the  event of cardiac or respiratory ARREST Use medication by any route, position, wound care, and other measures to relive pain and suffering. May use oxygen, suction and manual treatment of airway obstruction as needed for comfort.        Chief Complaint  Patient presents with  . Hospitalization Follow-up    HISTORY OF PRESENT ILLNESS:  This is a 76-YO female seen for hospital follow-up.  She was admitted to [facility] on 06/04/2016 following an admission at [facility] 05/31/2016-06/04/2016 for lethargy, fever, and hyperglycemia.  She was noted to have a sodium of 115, acute renal failure, and a fever of 101.8. Lactic acid was 4.1 which improved to 0.9 after IV hydration. She was started on Zosyn and vancomycin for 3 days then switched to Levaquin. No clear source of infection at the time of discharge. Sodium improved to 132.   She was recently discharged from [facility] to nursing home on 05/23/16 after a stroke.  She has been admitted for a short-term rehabilitation.  She was seen in the room with son and husband @ bedside.   PAST MEDICAL HISTORY:  Past Medical History:  Diagnosis Date  . Anemia   . Arthritis   .  Asthma    Home 3L O2   . Cancer ([place])   . Chronic anemia   . Depression    patient feels depressed at the end of the month  . Diabetes mellitus type 2, insulin dependent ([place])    initial diagnoses 11/2008  . Diabetes mellitus without complication ([place])   . Diabetic ketoacidosis ([place]) 11/2010  . Diverticulosis   . Esophageal stricture 06/2004   Dilation 06/2004  . Family history of anesthesia complication    DAUGHTER HAS NAUSEA  . GERD (gastroesophageal reflux disease)   . history of  pericarditis 12/2002  . History of blood transfusion   . Hypertension   . Myocardial infarction    Chest pain s/p normal cath in 08/2004 then NSTEMI during 11/2010 admission, negative Myoview  . Osteoarthritis   . Oxygen deficiency   . Pericardial effusion   . Rectal  bleeding 11/2010   Large hemorrhoids  . Rheumatoid arteritis   . Rheumatoid arthritis(714.0)    On MTX and chronic steroids  . Seizure ([place]) 11/2008  . Supraventricular tachycardia ([facility])      CURRENT MEDICATIONS: Reviewed  Patient's Medications  New Prescriptions   No medications on file  Previous Medications   ACETAMINOPHEN (TYLENOL) 650 MG CR TABLET    Take 650 mg by mouth 3 (three) times daily.   AMLODIPINE (NORVASC) 10 MG TABLET    Take 1 tablet (10 mg total) by mouth daily.   ASPIRIN EC 325 MG TABLET    Take 325 mg by mouth daily.   CITALOPRAM (CELEXA) 10 MG TABLET    Take 1 tablet (10 mg total) by mouth daily.   CLOPIDOGREL (PLAVIX) 75 MG TABLET    Take 1 tablet (75 mg total) by mouth daily.   COENZYME Q10 (CO Q 10) 100 MG CAPS    Take 100 mg by mouth daily.   DICYCLOMINE (BENTYL) 10 MG/5ML SYRUP    Take 5 mLs (10 mg total) by mouth 2 (two) times daily.   DIPHENHYDRAMINE (BENADRYL) 25 MG TABLET    Take 25 mg by mouth every 8 (eight) hours as needed for allergies.    DOCUSATE SODIUM (COLACE) 100 MG CAPSULE    Take 1 capsule (100 mg total) by mouth daily as needed for mild constipation.   DONEPEZIL (ARICEPT) 10 MG TABLET    Take 1 tablet (10 mg total) by mouth at bedtime.   ESTROGENS, CONJUGATED, (PREMARIN) 0.625 MG TABLET    Take 1 tablet (0.625 mg total) by mouth daily.   FE FUM-FEPOLY-VIT C-VIT B3 (INTEGRA) 62.5-62.5-40-3 MG CAPS    Take 1 capsule by mouth 2 (two) times daily.   FLUTICASONE (FLONASE) 50 MCG/ACT NASAL SPRAY    Place 2 sprays into both nostrils daily as needed for allergies or rhinitis.   GLUCOSAMINE-FISH OIL-EPA-DHA (GLUCOSAMINE & FISH OIL PO)    Take 1 capsule by mouth 2 (two) times daily.    INSULIN ASPART (NOVOLOG) 100 UNIT/ML INJECTION    Inject 4 Units into the skin 3 (three) times daily with meals.   INSULIN DETEMIR (LEVEMIR) 100 UNIT/ML INJECTION    Inject 0.05 mLs (5 Units total) into the skin at bedtime.   IPRATROPIUM-ALBUTEROL (DUONEB) 0.5-2.5 (3) MG/3ML  SOLN    Take 3 mLs by nebulization every 6 (six) hours as needed (for wheezing/shortness of breath).   LEFLUNOMIDE (ARAVA) 20 MG TABLET    Take 1 tablet (20 mg total) by mouth daily.   LEVALBUTEROL (XOPENEX HFA) 45 MCG/ACT INHALER  Inhale 2 puffs into the lungs every 4 (four) hours as needed for wheezing or shortness of breath.   LEVOFLOXACIN (LEVAQUIN) 500 MG TABLET    Take 1 tablet (500 mg total) by mouth every other day.   LISINOPRIL (PRINIVIL,ZESTRIL) 5 MG TABLET    Take 1 tablet (5 mg total) by mouth daily.   LORAZEPAM (ATIVAN) 0.5 MG TABLET    Take 1 tablet (0.5 mg total) by mouth every 4 (four) hours as needed for anxiety.   METOPROLOL (LOPRESSOR) 100 MG TABLET    Take 1 tablet (100 mg total) by mouth 2 (two) times daily.   MISC NATURAL PRODUCTS (ESTROVEN ENERGY PO)    Take 1 capsule by mouth daily.   MULTIPLE VITAMIN (MULTIVITAMIN WITH MINERALS) TABS TABLET    Take 1 tablet by mouth daily.   OMEPRAZOLE (PRILOSEC) 20 MG CAPSULE    Take 20 mg by mouth daily.   POLYVINYL ALCOHOL (LIQUIFILM TEARS) 1.4 % OPHTHALMIC SOLUTION    Place 1 drop into both eyes as needed for dry eyes.   PRAVASTATIN (PRAVACHOL) 20 MG TABLET    Take 1 tablet (20 mg total) by mouth daily at 6 PM.   PREDNISONE (DELTASONE) 10 MG TABLET    Take 1 tablet (10 mg total) by mouth daily.   SIMETHICONE (MYLICON) 40 [id number] DROPS    Take 1.2 mLs (80 mg total) by mouth 4 (four) times daily.   VITAMIN C (ASCORBIC ACID) 500 MG TABLET    Take 500 mg by mouth 2 (two) times daily.   Modified Medications   No medications on file  Discontinued Medications   MENAQUINONE-7 (VITAMIN K2) 100 MCG CAPS    Take 100 mcg by mouth daily.     Allergies  Allergen Reactions  . Demerol [Meperidine] Other (See Comments)    Reaction:  Hallucinations  . Penicillins Anaphylaxis and Other (See Comments)    Has patient had a PCN reaction causing immediate rash, facial/tongue/throat swelling, SOB or lightheadedness with hypotension: Yes Has  patient had a PCN reaction causing severe rash involving mucus membranes or skin necrosis: No Has patient had a PCN reaction that required hospitalization No Has patient had a PCN reaction occurring within the last 10 years: No If all of the above answers are "NO", then may proceed with Cephalosporin use.  . Enbrel [Etanercept] Swelling and Other (See Comments)    Reaction:  Arm swelling   . Humira [Adalimumab] Swelling and Other (See Comments)    Reaction:  Arm swelling      REVIEW OF SYSTEMS:  GENERAL: no change in appetite, no fatigue, no weight changes, no fever, chills or weakness EYES: Denies change in vision, dry eyes, eye pain, itching or discharge EARS: Denies change in hearing, ringing in ears, or earache NOSE: Denies nasal congestion or epistaxis MOUTH and THROAT: Denies oral discomfort, gingival pain or bleeding, pain from teeth or hoarseness   RESPIRATORY: no cough, SOB, DOE, wheezing, hemoptysis CARDIAC: no chest pain, edema or palpitations GI: no abdominal pain, diarrhea, constipation, heart burn, nausea or vomiting GU: Denies dysuria, frequency, hematuria, incontinence, or discharge PSYCHIATRIC: Denies feeling of depression or anxiety. No report of hallucinations, insomnia, paranoia, or agitation    PHYSICAL EXAMINATION  GENERAL APPEARANCE: Well nourished. In no acute distress. Normal body habitus SKIN:  Skin is warm and dry.  HEAD: Normal in size and contour. No evidence of trauma EYES: Lids open and close normally. No blepharitis, entropion or ectropion. PERRL. Conjunctivae are clear and sclerae  are white. Lenses are without opacity EARS: Pinnae are normal. Patient hears normal voice tunes of the examiner MOUTH and THROAT: Lips are without lesions. Oral mucosa is moist and without lesions. Tongue is normal in shape, size, and color and without lesions NECK: supple, trachea midline, no neck masses, no thyroid tenderness, no thyromegaly LYMPHATICS: no LAN in the  neck, no supraclavicular LAN RESPIRATORY: breathing is even & unlabored, BS CTAB CARDIAC: RRR, no murmur,no extra heart sounds, no edema GI: abdomen soft, normal BS, no masses, no tenderness, no hepatomegaly, no splenomegaly EXTREMITIES:  Able to move X 4 extremities, arthritic bilateral hands/fingers; BLE generalized weakness PSYCHIATRIC: Alert to self, disoriented to time and place. Affect and behavior are appropriate   LABS/RADIOLOGY: Labs reviewed: Basic Metabolic Panel:  Recent Labs  06/02/16 0343 06/03/16 0507 06/04/16 0501  NA 127* 130* 132*  K 5.1 4.8 4.6  CL 96* 98* 102  CO2 25 27 26   GLUCOSE 180* 135* 75  BUN 23* 20 19  CREATININE 1.24* 1.29* 0.99  CALCIUM 7.7* 7.8* 7.7*   Liver Function Tests:  Recent Labs  05/16/16 0330 05/17/16 1017 05/31/16 1505  AST 55* 27 23  ALT 19 17 18   ALKPHOS 81 72 105  BILITOT 1.2 0.8 0.7  PROT 5.1* 4.8* 5.8*  ALBUMIN 3.3* 2.9* 3.2*   CBC:  Recent Labs  12/28/15 1513  02/02/16 05/14/16 1111  06/02/16 0343 06/03/16 0507 06/04/16 0501  WBC 9.8  < > 6.2 8.0  < > 12.7* 9.5 7.8  NEUTROABS 9.0*  --  4 5.4  --   --   --   --   HGB 9.2*  < > 8.7* 9.6*  < > 8.2* 7.6* 7.1*  HCT 28.2*  < > 27* 29.8*  < > 23.2* 22.1* 20.6*  MCV 85.8  < >  --  89.2  < > 83.8 85.7 86.2  PLT 253  < > 353 220  < > 310 366 309  < > = values in this interval not displayed. Lipid Panel:  Recent Labs  05/15/16 0429  HDL 79   Cardiac Enzymes:  Recent Labs  06/02/16 1321  TROPONINI <0.03   CBG:  Recent Labs  06/04/16 0259 06/04/16 0721 06/04/16 1128  GLUCAP 82 76 415*      Ct Angio Head W Or Wo Contrast  Result Date: 05/14/2016 CLINICAL DATA:  Left-sided weakness.  Left sixth nerve palsy. EXAM: CT ANGIOGRAPHY HEAD AND NECK TECHNIQUE: Multidetector CT imaging of the head and neck was performed using the standard protocol during bolus administration of intravenous contrast. Multiplanar CT image reconstructions and MIPs were obtained to  evaluate the vascular anatomy. Carotid stenosis measurements (when applicable) are obtained utilizing NASCET criteria, using the distal internal carotid diameter as the denominator. CONTRAST:  50 mL Isovue 370 COMPARISON:  Noncontrast head CT earlier today and MRI 05/14/2015. Cervical spine MRI 08/19/2015. No prior angiographic imaging. FINDINGS: CTA NECK FINDINGS Aortic arch: 3 vessel aortic arch with moderate atherosclerotic plaque. Dilated distal aortic arch measuring 3.4 cm in diameter. Partially visualized aortic dissection in the proximal descending aorta. Eccentric calcified plaque in the proximal left subclavian artery results in less than 50% narrowing. Right carotid system: Medialized course of the common and proximal internal carotid arteries. Mild atherosclerotic plaque at the carotid bifurcation without stenosis. No evidence of dissection. Left carotid system: Medialized course of the common and proximal internal carotid arteries. No stenosis or evidence of dissection. Vertebral arteries: The right vertebral artery is patent  proximally with mild-to-moderate proximal V1 stenosis. Additional mild atherosclerotic narrowing is present in the right V2 segment, and the right vertebral artery occludes at the C2 level with distal reconstitution of the distal V3 and V4 segments. The left vertebral artery is either markedly compressed (and not visualize) or occluded over a short segment at the level of the C3 transverse foramen. The left vertebral artery is patent proximal and distal to this, however there is a caliber change with mild smooth narrowing at the C1-2 level followed by a return to a normal caliber more distally in the the V3 segment. Skeleton: Abnormal cervical alignment as demonstrated on prior MRI. There is 8 mm anterior subluxation of C3 on C4 with appearance suggesting perched, fused facets on the left. There is C4 and C5 vertebral body height loss, and there is fusion between the vertebral  bodies from C3-C6 which may reflect posttraumatic degenerative change. There is prominent focal kyphosis centered at C4, and there is slight anterolisthesis of C2 on C3. Mild erosive changes are noted involving the dens. Other neck: No neck mass or lymph node enlargement. Upper chest: Minimal pleural-parenchymal scarring in the lung apices. Review of the MIP images confirms the above findings CTA HEAD FINDINGS Anterior circulation: The internal carotid arteries are patent from skullbase to carotid termini with bilateral siphon atherosclerosis resulting in mild to moderate luminal irregularity. There is mild right ICA stenosis near the petrous - cavernous junction. An infundibulum is noted at the left posterior communicating origin. Anterior communicating artery is unremarkable. ACAs and MCAs are patent without evidence of major branch occlusion or significant proximal stenosis. No aneurysm. Posterior circulation: Intracranial vertebral arteries are patent without focal stenosis. Left PICA, bilateral AICA, and bilateral SCA origins are patent. Basilar artery is widely patent. There are small posterior communicating arteries bilaterally. The PCAs are patent without evidence of significant stenosis. No aneurysm. Venous sinuses: Patent. Anatomic variants: None. Delayed phase: No abnormal enhancement. Review of the MIP images confirms the above findings IMPRESSION: 1. Partially visualized type B aortic dissection. 2. No major intracranial arterial occlusion. Mild intracranial right ICA stenosis. 3. Partial occlusion of the right V3 segment with distal reconstitution. 4. Severe osseous compression of the left vertebral artery at the C3 level versus short segment occlusion secondary to chronic malalignment and osseous deformities, most likely related to remote trauma. 5. Mild narrowing of the left V3 segment compatible with short segment dissection, also potentially related to prior trauma although an acute dissection  cannot be entirely excluded given the lack of prior angiographic studies for comparison. 6. No cervical carotid artery stenosis. These results were called by telephone at the time of interpretation on 05/14/2016 at 1:56 pm to Dr. Merrily Pew , who verbally acknowledged these results. Electronically Signed   By: Logan Bores M.D.   On: 05/14/2016 14:00   Ct Head Wo Contrast  Result Date: 05/31/2016 CLINICAL DATA:  76 y/o  F; altered mental status. EXAM: CT HEAD WITHOUT CONTRAST TECHNIQUE: Contiguous axial images were obtained from the base of the skull through the vertex without intravenous contrast. COMPARISON:  05/14/2016 MRI of the brain. 05/14/2016 CT of the head. FINDINGS: Brain: No evidence of acute infarction, hemorrhage, hydrocephalus, extra-axial collection or mass lesion/mass effect. Stable small chronic infarcts in right frontal white matter and the left cerebellar hemisphere. Stable moderate chronic microvascular ischemic changes and mild parenchymal volume loss of the brain. Vascular: Calcific atherosclerosis of cavernous and paraclinoid internal carotid arteries. Skull: Normal. Negative for fracture or focal  lesion. Sinuses/Orbits: No acute finding. Other: Bilateral intra-ocular lens replacement. IMPRESSION: 1. No acute intracranial abnormality is identified. 2. Stable moderate chronic microvascular ischemic changes and mild parenchymal volume loss of the brain. Stable chronic infarcts in right frontal white matter and left cerebellar hemisphere. Electronically Signed   By: Kristine Garbe M.D.   On: 05/31/2016 15:49   Ct Head Wo Contrast  Result Date: 05/14/2016 CLINICAL DATA:  Pt brought in by EMS due to having blurry vision in right eye, abnormal gait, and leaning to the left EXAM: CT HEAD WITHOUT CONTRAST TECHNIQUE: Contiguous axial images were obtained from the base of the skull through the vertex without intravenous contrast. COMPARISON:  None MRI 05/14/2015 FINDINGS: Brain: No  acute intracranial hemorrhage. No focal mass lesion. No CT evidence of acute infarction. No midline shift or mass effect. No hydrocephalus. Basilar cisterns are patent. Remote infarction in the LEFT cerebellum (image 9, series 3). Deep white matter infarction in the RIGHT frontal lobe also unchanged. Periventricular and subcortical white matter hypodensities. Generalized cortical atrophy. Vascular: Rim Skull: Normal. Negative for fracture or focal lesion. Sinuses/Orbits: Paranasal sinuses and mastoid air cells are clear. Orbits are clear. Other: None. IMPRESSION: 1. No acute intracranial findings. 2. Chronic atrophy and white matter microvascular disease. 3. Remote cerebellar infarct. Electronically Signed   By: Suzy Bouchard M.D.   On: 05/14/2016 11:44   Ct Angio Neck W And/or Wo Contrast  Result Date: 05/14/2016 CLINICAL DATA:  Left-sided weakness.  Left sixth nerve palsy. EXAM: CT ANGIOGRAPHY HEAD AND NECK TECHNIQUE: Multidetector CT imaging of the head and neck was performed using the standard protocol during bolus administration of intravenous contrast. Multiplanar CT image reconstructions and MIPs were obtained to evaluate the vascular anatomy. Carotid stenosis measurements (when applicable) are obtained utilizing NASCET criteria, using the distal internal carotid diameter as the denominator. CONTRAST:  50 mL Isovue 370 COMPARISON:  Noncontrast head CT earlier today and MRI 05/14/2015. Cervical spine MRI 08/19/2015. No prior angiographic imaging. FINDINGS: CTA NECK FINDINGS Aortic arch: 3 vessel aortic arch with moderate atherosclerotic plaque. Dilated distal aortic arch measuring 3.4 cm in diameter. Partially visualized aortic dissection in the proximal descending aorta. Eccentric calcified plaque in the proximal left subclavian artery results in less than 50% narrowing. Right carotid system: Medialized course of the common and proximal internal carotid arteries. Mild atherosclerotic plaque at the  carotid bifurcation without stenosis. No evidence of dissection. Left carotid system: Medialized course of the common and proximal internal carotid arteries. No stenosis or evidence of dissection. Vertebral arteries: The right vertebral artery is patent proximally with mild-to-moderate proximal V1 stenosis. Additional mild atherosclerotic narrowing is present in the right V2 segment, and the right vertebral artery occludes at the C2 level with distal reconstitution of the distal V3 and V4 segments. The left vertebral artery is either markedly compressed (and not visualize) or occluded over a short segment at the level of the C3 transverse foramen. The left vertebral artery is patent proximal and distal to this, however there is a caliber change with mild smooth narrowing at the C1-2 level followed by a return to a normal caliber more distally in the the V3 segment. Skeleton: Abnormal cervical alignment as demonstrated on prior MRI. There is 8 mm anterior subluxation of C3 on C4 with appearance suggesting perched, fused facets on the left. There is C4 and C5 vertebral body height loss, and there is fusion between the vertebral bodies from C3-C6 which may reflect posttraumatic degenerative change. There is prominent  focal kyphosis centered at C4, and there is slight anterolisthesis of C2 on C3. Mild erosive changes are noted involving the dens. Other neck: No neck mass or lymph node enlargement. Upper chest: Minimal pleural-parenchymal scarring in the lung apices. Review of the MIP images confirms the above findings CTA HEAD FINDINGS Anterior circulation: The internal carotid arteries are patent from skullbase to carotid termini with bilateral siphon atherosclerosis resulting in mild to moderate luminal irregularity. There is mild right ICA stenosis near the petrous - cavernous junction. An infundibulum is noted at the left posterior communicating origin. Anterior communicating artery is unremarkable. ACAs and MCAs  are patent without evidence of major branch occlusion or significant proximal stenosis. No aneurysm. Posterior circulation: Intracranial vertebral arteries are patent without focal stenosis. Left PICA, bilateral AICA, and bilateral SCA origins are patent. Basilar artery is widely patent. There are small posterior communicating arteries bilaterally. The PCAs are patent without evidence of significant stenosis. No aneurysm. Venous sinuses: Patent. Anatomic variants: None. Delayed phase: No abnormal enhancement. Review of the MIP images confirms the above findings IMPRESSION: 1. Partially visualized type B aortic dissection. 2. No major intracranial arterial occlusion. Mild intracranial right ICA stenosis. 3. Partial occlusion of the right V3 segment with distal reconstitution. 4. Severe osseous compression of the left vertebral artery at the C3 level versus short segment occlusion secondary to chronic malalignment and osseous deformities, most likely related to remote trauma. 5. Mild narrowing of the left V3 segment compatible with short segment dissection, also potentially related to prior trauma although an acute dissection cannot be entirely excluded given the lack of prior angiographic studies for comparison. 6. No cervical carotid artery stenosis. These results were called by telephone at the time of interpretation on 05/14/2016 at 1:56 pm to Dr. Merrily Pew , who verbally acknowledged these results. Electronically Signed   By: Logan Bores M.D.   On: 05/14/2016 14:00   Mr Virgel Paling [id number] Contrast  Result Date: 05/14/2016 CLINICAL DATA:  Blurry vision in right eye and left-sided weakness. EXAM: MRI HEAD WITHOUT CONTRAST MRA HEAD WITHOUT CONTRAST TECHNIQUE: Axial and coronal diffusion weighted sequences and axial time-of-flight MRA of the brain was performed. COMPARISON:  05/14/2016 CT angiogram head and neck. FINDINGS: MRI HEAD FINDINGS Small foci of diffusion restriction within the right occipital lobe, right  paramedian midbrain, and scattered in bilateral cerebellar hemispheres compatible with acute/early subacute infarction. MRA HEAD FINDINGS Severe motion artifact. Suboptimal evaluation for stenosis or aneurysm. Bilateral vertebral arteries and the basilar artery are patent. Bilateral internal carotid arteries, bilateral middle cerebral arteries, and bilateral anterior cerebral arteries are patent proximally. IMPRESSION: 1. 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 2. Severe motion artifact of time-of-flight MRA. No new proximal large vessel occlusion identified. These results will be called to the ordering clinician or representative by the Radiologist Assistant, and communication documented in the PACS or zVision Dashboard. Electronically Signed   By: Kristine Garbe M.D.   On: 05/14/2016 17:57   Dg Chest Port 1 View  Result Date: 05/31/2016 CLINICAL DATA:  Altered mental status EXAM: PORTABLE CHEST 1 VIEW COMPARISON:  01/19/2016 FINDINGS: Heart size normal. Ectasia of the thoracic aorta. Negative for heart failure. Negative for pneumonia or effusion. Chronic right-sided rib fractures. Apical scarring bilaterally. Chronic shoulder arthropathy bilaterally. IMPRESSION: No active disease. Electronically Signed   By: Franchot Gallo M.D.   On: 05/31/2016 15:38   Mr Brain Limited Wo Contrast  Result Date: 05/14/2016  CLINICAL DATA:  Blurry vision in right eye and left-sided weakness. EXAM: MRI HEAD WITHOUT CONTRAST MRA HEAD WITHOUT CONTRAST TECHNIQUE: Axial and coronal diffusion weighted sequences and axial time-of-flight MRA of the brain was performed. COMPARISON:  05/14/2016 CT angiogram head and neck. FINDINGS: MRI HEAD FINDINGS Small foci of diffusion restriction within the right occipital lobe, right paramedian midbrain, and scattered in bilateral cerebellar hemispheres compatible with acute/early subacute  infarction. MRA HEAD FINDINGS Severe motion artifact. Suboptimal evaluation for stenosis or aneurysm. Bilateral vertebral arteries and the basilar artery are patent. Bilateral internal carotid arteries, bilateral middle cerebral arteries, and bilateral anterior cerebral arteries are patent proximally. IMPRESSION: 1. 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 2. Severe motion artifact of time-of-flight MRA. No new proximal large vessel occlusion identified. These results will be called to the ordering clinician or representative by the Radiologist Assistant, and communication documented in the PACS or zVision Dashboard. Electronically Signed   By: Kristine Garbe M.D.   On: 05/14/2016 17:57   Ct Angio Chest/abd/pel For Dissection W And/or W/wo  Result Date: 05/16/2016 CLINICAL DATA:  Aortic dissection EXAM: CT ANGIOGRAPHY CHEST, ABDOMEN AND PELVIS TECHNIQUE: Multidetector CT imaging through the chest, abdomen and pelvis was performed using the standard protocol during bolus administration of intravenous contrast. Multiplanar reconstructed images and MIPs were obtained and reviewed to evaluate the vascular anatomy. CONTRAST:  60 cc Isovue 370 COMPARISON:  None. FINDINGS: CTA CHEST FINDINGS Cardiovascular: There is no evidence of intramural hematoma. There is no evidence of dissection in the ascending aorta. 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 There is no evidence of aortic rupture. There is a small dissection flaps that is 10 mm in vertical length. 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 Maximal diameter of the aorta at this segment is 3.4 cm. The great vessels are patent. There is no obvious pulmonary thromboembolism. Mediastinum/Nodes: There is no evidence of abnormal mediastinal adenopathy. Thyroid is unremarkable. Has offered this is  unremarkable. No pericardial effusion. Lungs/Pleura: There is scattered atelectasis in the lungs. No pneumothorax. No pleural effusion. Musculoskeletal: Displaced acute right seventh, eighth, ninth, and tenth rib fractures. There is healing at some of these fractures. The right sixth rib fracture has a subacute appearance. No vertebral compression deformity in the thoracic spine. Review of the MIP images confirms the above findings. CTA ABDOMEN AND PELVIS FINDINGS VASCULAR Aorta: No evidence of aortic aneurysm or dissection within the abdomen. Aorta is patent. Celiac: Patent. Mild atherosclerotic changes at the origin. Accessory left hepatic artery anatomy. SMA: Patent origin with atherosclerotic calcification. Renals: Single right renal artery is patent. There are 2 left renal arteries. The dominant left renal artery is patent. The more diminutive left renal artery is grossly patent, however the origin is somewhat obscured. IMA: IMA is diminutive and patent. Inflow: There are atherosclerotic changes with some irregular plaque at the aortic bifurcation and origin of the common iliac arteries. There are diffuse atherosclerotic changes of the right common iliac artery without significant narrowing. Right external iliac and internal iliac arteries are patent. Atherosclerotic changes of the left common iliac artery are present without significant narrowing. Left internal and external iliac arteries are patent. Veins: Portal vein is patent. Splenic vein is patent. Renal veins are suboptimally opacified. Review of the MIP images confirms the above findings. NON-VASCULAR Hepatobiliary: Contour of the liver is somewhat nodular. The left lobe is prominent. This appearance has slightly  progressed since the prior study. No focal mass. Pancreas: Pancreas is unremarkable. Spleen: Several calcified granulomata are present in the spleen. Adrenals/Urinary Tract: Chronic changes of the kidneys. Simple cysts in the left kidney on  image 141 of series 6. Adrenal glands are within normal limits. The bladder is very distended. Stomach/Bowel: Small hiatal hernia. No evidence of small-bowel obstruction. No focal mass in the colon. Diverticulosis of the colon. Moderate stool burden in the rectum. Lymphatic: There is no evidence of retroperitoneal adenopathy. Reproductive: Uterus is absent.  Adnexa are within normal limits. Other: There is no free fluid. Musculoskeletal: There are healing fractures of the superior and inferior pubic rami. There are severe degenerative changes in the lumbar spine. New L1 compression fracture is present with slight retropulsion of the superior endplate and [id number] loss height anteriorly. Levoscoliosis at L3 is present. Review of the MIP images confirms the above findings. 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 If the patient is hypertensive, aggressive blood pressure control is recommended. No evidence of dissection or aneurysm within the abdominal aorta. Atherosclerotic changes are noted. 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 Multiple right-sided rib fractures as described. Some are healing. The right sixth rib fracture has a subacute appearance. L1 compression fracture of indeterminate age. Electronically Signed   By: Marybelle Killings M.D.   On: 05/16/2016 08:30    ASSESSMENT/PLAN:  Physical deconditioning - for rehabilitation, PT and OT, for therapeutic strengthening exercises; fall precautions  Sepsis - Lactic acid was 4.1 upon admission which went down tp 0.9 after IV fluid hydration. She was given Zosyn and Vancomycin X 3 days then switched to Levaquin X 3 days  Severe  Hyponatremia - Na 132, was given NS IV fluids; recommended to take 1.5L/day ; check BMP on 06/10/16  Insulin-dependent diabetes mellitus - continue Levemir 100 units/mL inject 5 units subcutaneous daily at bedtime, NovoLog 100 units/mL inject 4 units  subcutaneous 3 times a day with meals; check CBG ACHS Lab Results  Component Value Date   HGBA1C 6.8 (H) 05/31/2016   Iron deficiency anemia - Continue Integra capsule 1 capsule PO BID Lab Results  Component Value Date   HGB 7.1 (L) 06/04/2016   IBS - continue Dicyclomine 10 mg/5 ml PO BID  Hypertension - continue Lopressor 100 mg 1 tab by mouth twice a day , amlodipine besylate 10 mg 1 tab by mouth daily and lisinopril 5 mg 1 tab by mouth daily   Allergic rhinitis - continue Flonase 50 g 2 sprays into both nostrils daily when necessary    GERD  - continue omeprazole 20 mg 1 capsule by mouth daily   Depression - mood this is stable; continue Celexa 10 mg 1 tab by mouth daily  Stroke - continue Plavix 75 mg 1 tab by mouth daily and aspirin 325 mg 1 tab by mouth daily  Rheumatoid arthritis - continue Arava 20 mg 1 tab by mouth daily and prednisone 10 mg 1 tab by mouth daily  Dementia - continue Aricept 10 mg 1 tab by mouth daily at bedtime  Hyperlipidemia - continue pravastatin 20 mg 1 tab by mouth daily Lab Results  Component Value Date   CHOL 185 05/15/2016   HDL 79 05/15/2016   LDLCALC 81 05/15/2016   TRIG 123 05/15/2016   CHOLHDL 2.3 05/15/2016   Anxiety - mood is stable; continue lorazepam 0.5 mg 1 tab by mouth every 4 hours when necessary X 14  days   Dysphagia - ST evaluation and treatment for swallowing functions; aspiration precautions    Goals of care:  Short-term rehabilitation    Lynden Flemmer C. London - NP    [employer] [phone]

## 2016-06-10 LAB — BASIC METABOLIC PANEL
BUN: 21 mg/dL (ref 4–21)
CREATININE: 0.9 mg/dL (ref 0.5–1.1)
Glucose: 243 mg/dL
POTASSIUM: 4.1 mmol/L (ref 3.4–5.3)
Sodium: 136 mmol/L — AB (ref 137–147)

## 2016-06-10 LAB — CBC AND DIFFERENTIAL
HEMATOCRIT: 28 % — AB (ref 36–46)
Hemoglobin: 8.7 g/dL — AB (ref 12.0–16.0)
Platelets: 368 10*3/uL (ref 150–399)
WBC: 12.9 10^3/mL

## 2016-06-12 ENCOUNTER — Ambulatory Visit: Payer: Medicare Other | Admitting: Neurology

## 2016-06-17 ENCOUNTER — Non-Acute Institutional Stay (SKILLED_NURSING_FACILITY): Payer: Medicare Other | Admitting: Internal Medicine

## 2016-06-17 ENCOUNTER — Encounter: Payer: Self-pay | Admitting: Internal Medicine

## 2016-06-17 DIAGNOSIS — R5381 Other malaise: Secondary | ICD-10-CM | POA: Diagnosis not present

## 2016-06-17 DIAGNOSIS — I63213 Cerebral infarction due to unspecified occlusion or stenosis of bilateral vertebral arteries: Secondary | ICD-10-CM

## 2016-06-17 DIAGNOSIS — K580 Irritable bowel syndrome with diarrhea: Secondary | ICD-10-CM

## 2016-06-17 DIAGNOSIS — I10 Essential (primary) hypertension: Secondary | ICD-10-CM | POA: Diagnosis not present

## 2016-06-17 DIAGNOSIS — M069 Rheumatoid arthritis, unspecified: Secondary | ICD-10-CM

## 2016-06-17 DIAGNOSIS — D638 Anemia in other chronic diseases classified elsewhere: Secondary | ICD-10-CM

## 2016-06-17 DIAGNOSIS — K2100 Gastro-esophageal reflux disease with esophagitis, without bleeding: Secondary | ICD-10-CM

## 2016-06-17 DIAGNOSIS — J9611 Chronic respiratory failure with hypoxia: Secondary | ICD-10-CM | POA: Diagnosis not present

## 2016-06-17 DIAGNOSIS — E871 Hypo-osmolality and hyponatremia: Secondary | ICD-10-CM

## 2016-06-17 DIAGNOSIS — E119 Type 2 diabetes mellitus without complications: Secondary | ICD-10-CM

## 2016-06-17 DIAGNOSIS — L89611 Pressure ulcer of right heel, stage 1: Secondary | ICD-10-CM

## 2016-06-17 DIAGNOSIS — F329 Major depressive disorder, single episode, unspecified: Secondary | ICD-10-CM

## 2016-06-17 DIAGNOSIS — R350 Frequency of micturition: Secondary | ICD-10-CM

## 2016-06-17 DIAGNOSIS — Z794 Long term (current) use of insulin: Secondary | ICD-10-CM

## 2016-06-17 DIAGNOSIS — D72829 Elevated white blood cell count, unspecified: Secondary | ICD-10-CM

## 2016-06-17 DIAGNOSIS — F32A Depression, unspecified: Secondary | ICD-10-CM

## 2016-06-17 NOTE — Progress Notes (Signed)
LOCATION: [facility]  PCP: Jani Gravel, MD   Code Status: Full Code  Goals of care: Advanced Directive information Advanced Directives 05/31/2016  Does Patient Have a Medical Advance Directive? No  Type of Advance Directive -  Does patient want to make changes to medical advance directive? -  Copy of [place] in Chart? -  Would patient like information on creating a medical advance directive? No - Patient declined  Pre-existing out of facility DNR order (yellow form or pink MOST form) -       Extended Emergency Contact Information Primary Emergency Contact: [facility] Address: [address]          [place], [place] [address] Johnnette Litter of [place] Phone: [phone] Mobile Phone: [phone] Relation: Spouse Secondary Emergency Contact: Ramsay,Suzanne  [place] Mobile Phone: [phone] Relation: Daughter   Allergies  Allergen Reactions  . Demerol [Meperidine] Other (See Comments)    Reaction:  Hallucinations  . Penicillins Anaphylaxis and Other (See Comments)    Has patient had a PCN reaction causing immediate rash, facial/tongue/throat swelling, SOB or lightheadedness with hypotension: Yes Has patient had a PCN reaction causing severe rash involving mucus membranes or skin necrosis: No Has patient had a PCN reaction that required hospitalization No Has patient had a PCN reaction occurring within the last 10 years: No If all of the above answers are "NO", then may proceed with Cephalosporin use.  . Enbrel [Etanercept] Swelling and Other (See Comments)    Reaction:  Arm swelling   . Humira [Adalimumab] Swelling and Other (See Comments)    Reaction:  Arm swelling     Chief Complaint  Patient presents with  . New Admit To SNF    New Admission Visit      HPI:  Patient is a 76 y.o. female seen today for short term rehabilitation post hospital admission from 16th of February 2018-20th of February 2018. She  was admitted to the hospital with fever, tachypnea and elevated white count with concern for sepsis. She was treated with IV fluids and antibiotic. Infectious workup was negative. She was found to be severe the hyponatremic with acute renal failure and responded well to IV fluids. She had episodes of hypoglycemia in the hospital following which her Levemir dosing was adjusted.She has medical history of stroke, rheumatoid arthritis, hypertension, GERD, diabetes mellitus among others. She is seen in her room today with her husband and her son present at bedside.  Review of Systems:  Constitutional: Negative for fever, chills. Energy level is slowly coming back. HENT: Negative for headache, congestion, nasal discharge, sore throat, difficulty swallowing.   Eyes: Negative for blurred vision, double vision and discharge. Wears glasses.  Respiratory: Negative for cough and wheezing. Positive for shortness of breath both with rest and with exertion. Has been on oxygen at home.    Cardiovascular: Negative for chest pain, palpitations, leg swelling.  Gastrointestinal: Negative for vomiting, abdominal pain, loss of appetite, melena, diarrhea and constipation. Positive for occasional heartburn. She had nausea this am that has now resolved. Last bowel movement was this morning with loose stool. She has been having loose stool for several months. She took imodium at home and was helpful.  Genitourinary: positive for increased urinary frequency, negative for dysuria.  Musculoskeletal: Negative for fall in the facility. She uses a wheelchair and a walker at home to ambulate. Skin: Negative for itching, rash.  Neurological: Positive for occasional dizziness with change of position.  Psychiatric/Behavioral: Negative  for depression   Past Medical History:  Diagnosis Date  . Anemia   . Arthritis   . Asthma    Home 3L O2   . Cancer ([place])   . Chronic anemia   . Depression    patient feels depressed at the end of  the month  . Diabetes mellitus type 2, insulin dependent ([place])    initial diagnoses 11/2008  . Diabetes mellitus without complication ([place])   . Diabetic ketoacidosis ([place]) 11/2010  . Diverticulosis   . Esophageal stricture 06/2004   Dilation 06/2004  . Family history of anesthesia complication    DAUGHTER HAS NAUSEA  . GERD (gastroesophageal reflux disease)   . history of  pericarditis 12/2002  . History of blood transfusion   . Hypertension   . Myocardial infarction    Chest pain s/p normal cath in 08/2004 then NSTEMI during 11/2010 admission, negative Myoview  . Osteoarthritis   . Oxygen deficiency   . Pericardial effusion   . Rectal bleeding 11/2010   Large hemorrhoids  . Rheumatoid arteritis   . Rheumatoid arthritis(714.0)    On MTX and chronic steroids  . Seizure ([place]) 11/2008  . Supraventricular tachycardia [name])    Past Surgical History:  Procedure Laterality Date  . ABDOMINAL HYSTERECTOMY  1980  . CHOLECYSTECTOMY    . COLONOSCOPY N/A 01/06/2013   Procedure: COLONOSCOPY;  Surgeon: Ladene Artist, MD;  Location: [facility] ENDOSCOPY;  Service: Endoscopy;  Laterality: N/A;  . ESOPHAGOGASTRODUODENOSCOPY N/A 02/11/2013   Procedure: ESOPHAGOGASTRODUODENOSCOPY (EGD);  Surgeon: Irene Shipper, MD;  Location: [facility] ENDOSCOPY;  Service: Endoscopy;  Laterality: N/A;  . INCISE AND DRAIN ABCESS  09/2011   I&D of peri-rectal abcess per Dr Zella Richer.    Social History:   reports that she has never smoked. She has never used smokeless tobacco. She reports that she does not drink alcohol or use drugs.  Family History  Problem Relation Age of Onset  . Heart disease Mother   . Stroke Father   . Dementia Neg Hx     Medications: Allergies as of 06/17/2016      Reactions   Demerol [meperidine] Other (See Comments)   Reaction:  Hallucinations   Penicillins Anaphylaxis, Other (See Comments)   Has patient had a PCN reaction causing immediate rash, facial/tongue/throat swelling, SOB or lightheadedness  with hypotension: Yes Has patient had a PCN reaction causing severe rash involving mucus membranes or skin necrosis: No Has patient had a PCN reaction that required hospitalization No Has patient had a PCN reaction occurring within the last 10 years: No If all of the above answers are "NO", then may proceed with Cephalosporin use.   Enbrel [etanercept] Swelling, Other (See Comments)   Reaction:  Arm swelling   Humira [adalimumab] Swelling, Other (See Comments)   Reaction:  Arm swelling      Medication List       Accurate as of 06/17/16  1:05 PM. Always use your most recent med list.          acetaminophen 650 MG CR tablet Commonly known as:  TYLENOL Take 650 mg by mouth 3 (three) times daily.   amLODipine 10 MG tablet Commonly known as:  NORVASC Take 1 tablet (10 mg total) by mouth daily.   aspirin EC 325 MG tablet Take 325 mg by mouth daily.   CALMOSEPTINE EX Apply 1 application topically 3 (three) times daily.   citalopram 10 MG tablet Commonly known as:  CELEXA Take 1 tablet (10 mg  total) by mouth daily.   clopidogrel 75 MG tablet Commonly known as:  PLAVIX Take 1 tablet (75 mg total) by mouth daily.   Co Q 10 100 MG Caps Take 100 mg by mouth daily.   dicyclomine 10 MG/5ML syrup Commonly known as:  BENTYL Take 5 mLs (10 mg total) by mouth 2 (two) times daily.   diphenhydrAMINE 25 MG tablet Commonly known as:  BENADRYL Take 25 mg by mouth every 8 (eight) hours as needed for allergies.   docusate sodium 100 MG capsule Commonly known as:  COLACE Take 1 capsule (100 mg total) by mouth daily as needed for mild constipation.   donepezil 10 MG tablet Commonly known as:  ARICEPT Take 1 tablet (10 mg total) by mouth at bedtime.   estrogens (conjugated) 0.625 MG tablet Commonly known as:  PREMARIN Take 1 tablet (0.625 mg total) by mouth daily.   ESTROVEN ENERGY PO Take 1 capsule by mouth daily.   fluticasone 50 MCG/ACT nasal spray Commonly known as:   FLONASE Place 2 sprays into both nostrils daily as needed for allergies or rhinitis.   GLUCOSAMINE & FISH OIL PO Take 1 capsule by mouth 2 (two) times daily.   ICY HOT LIDOCAINE PLUS MENTHOL 4-1 % Ptch Generic drug:  Lidocaine-Menthol Apply 1 patch topically every 12 (twelve) hours.   insulin detemir 100 UNIT/ML injection Commonly known as:  LEVEMIR Inject 0.05 mLs (5 Units total) into the skin at bedtime.   insulin lispro 100 UNIT/ML injection Commonly known as:  HUMALOG Inject 0-9 Units into the skin 3 (three) times daily before meals.   INTEGRA 62.5-62.5-40-3 MG Caps Take 1 capsule by mouth 2 (two) times daily.   ipratropium-albuterol 0.5-2.5 (3) MG/3ML Soln Commonly known as:  DUONEB Take 3 mLs by nebulization every 6 (six) hours as needed (for wheezing/shortness of breath).   leflunomide 20 MG tablet Commonly known as:  ARAVA Take 1 tablet (20 mg total) by mouth daily.   levalbuterol 45 MCG/ACT inhaler Commonly known as:  XOPENEX HFA Inhale 2 puffs into the lungs every 4 (four) hours as needed for wheezing or shortness of breath.   lisinopril 5 MG tablet Commonly known as:  PRINIVIL,ZESTRIL Take 1 tablet (5 mg total) by mouth daily.   LORazepam 0.5 MG tablet Commonly known as:  ATIVAN Take 1 tablet (0.5 mg total) by mouth every 4 (four) hours as needed for anxiety.   metoprolol 100 MG tablet Commonly known as:  LOPRESSOR Take 1 tablet (100 mg total) by mouth 2 (two) times daily.   multivitamin with minerals Tabs tablet Take 1 tablet by mouth daily.   pantoprazole 40 MG tablet Commonly known as:  PROTONIX Take 40 mg by mouth daily.   polyvinyl alcohol 1.4 % ophthalmic solution Commonly known as:  LIQUIFILM TEARS Place 1 drop into both eyes as needed for dry eyes.   predniSONE 10 MG tablet Commonly known as:  DELTASONE Take 1 tablet (10 mg total) by mouth daily.   simethicone 40 MG/0.6ML drops Commonly known as:  MYLICON Take 1.2 mLs (80 mg total) by  mouth 4 (four) times daily.   traMADol 50 MG tablet Commonly known as:  ULTRAM Take 50 mg by mouth every 8 (eight) hours as needed.   UNABLE TO FIND Med Name: Med pas 120 mL by mouth 2 times daily   vitamin C 500 MG tablet Commonly known as:  ASCORBIC ACID Take 500 mg by mouth 2 (two) times daily.   VITAMIN K  PO Take 100 mcg by mouth daily.       Immunizations: Immunization History  Administered Date(s) Administered  . Influenza Split 02/27/2012, 02/03/2013  . Influenza,inj,Quad PF,36+ Mos 01/05/2014     Physical Exam: Vitals:   06/17/16 1139  BP: (!) 147/77  Pulse: 74  Resp: 16  Temp: (!) 96.8 F (36 C)  TempSrc: Oral  SpO2: 98%  Weight: 109 lb (49.4 kg)  Height: 4\' 7"  (1.397 m)   Body mass index is 25.33 kg/m.  General- elderly female, frail, in no acute distress Head- normocephalic, atraumatic Nose- no maxillary or frontal sinus tenderness, no nasal discharge Throat- moist mucus membrane, normal oropharynx, missing teeth Eyes- PERRLA, EOMI, no pallor, no icterus, no discharge, normal conjunctiva, normal sclera Neck- no cervical lymphadenopathy Cardiovascular- normal s1,s2, no murmur Respiratory- bilateral clear to auscultation, no wheeze, no rhonchi, no crackles, no use of accessory muscles, on 2.5 L oxygen by nasal cannula Abdomen- bowel sounds present, soft, non tender, no guarding or rigidity, no CVA tenderness Musculoskeletal- able to move all 4 extremities, severe arthritis changes to her hands, no leg edema, generalized weakness Neurological- alert and oriented to person, place and time Skin- warm and dry, bruises to both her arms, stage 1 pressure ulcer right heel Psychiatry- normal mood and affect    Labs reviewed: Basic Metabolic Panel:  Recent Labs  06/02/16 0343 06/03/16 0507 06/04/16 0501 06/10/16  NA 127* 130* 132* 136*  K 5.1 4.8 4.6 4.1  CL 96* 98* 102  --   CO2 25 27 26   --   GLUCOSE 180* 135* 75  --   BUN 23* 20 19 21    CREATININE 1.24* 1.29* 0.99 0.9  CALCIUM 7.7* 7.8* 7.7*  --    Liver Function Tests:  Recent Labs  05/16/16 0330 05/17/16 1017 05/31/16 1505  AST 55* 27 23  ALT 19 17 18   ALKPHOS 81 72 105  BILITOT 1.2 0.8 0.7  PROT 5.1* 4.8* 5.8*  ALBUMIN 3.3* 2.9* 3.2*   No results for input(s): LIPASE, AMYLASE in the last 8760 hours. No results for input(s): AMMONIA in the last 8760 hours. CBC:  Recent Labs  12/28/15 1513  02/02/16 05/14/16 1111  06/02/16 0343 06/03/16 0507 06/04/16 0501 06/10/16  WBC 9.8  < > 6.2 8.0  < > 12.7* 9.5 7.8 12.9  NEUTROABS 9.0*  --  4 5.4  --   --   --   --   --   HGB 9.2*  < > 8.7* 9.6*  < > 8.2* 7.6* 7.1* 8.7*  HCT 28.2*  < > 27* 29.8*  < > 23.2* 22.1* 20.6* 28*  MCV 85.8  < >  --  89.2  < > 83.8 85.7 86.2  --   PLT 253  < > 353 220  < > 310 366 309 368  < > = values in this interval not displayed. Cardiac Enzymes:  Recent Labs  06/02/16 1321  TROPONINI <0.03   BNP: Invalid input(s): POCBNP CBG:  Recent Labs  06/04/16 0259 06/04/16 0721 06/04/16 1128  GLUCAP 82 76 415*    Radiological Exams: Ct Head Wo Contrast  Result Date: 05/31/2016 CLINICAL DATA:  76 y/o  F; altered mental status. EXAM: CT HEAD WITHOUT CONTRAST TECHNIQUE: Contiguous axial images were obtained from the base of the skull through the vertex without intravenous contrast. COMPARISON:  05/14/2016 MRI of the brain. 05/14/2016 CT of the head. FINDINGS: Brain: No evidence of acute infarction, hemorrhage, hydrocephalus, extra-axial collection or  mass lesion/mass effect. Stable small chronic infarcts in right frontal white matter and the left cerebellar hemisphere. Stable moderate chronic microvascular ischemic changes and mild parenchymal volume loss of the brain. Vascular: Calcific atherosclerosis of cavernous and paraclinoid internal carotid arteries. Skull: Normal. Negative for fracture or focal lesion. Sinuses/Orbits: No acute finding. Other: Bilateral intra-ocular lens  replacement. IMPRESSION: 1. No acute intracranial abnormality is identified. 2. Stable moderate chronic microvascular ischemic changes and mild parenchymal volume loss of the brain. Stable chronic infarcts in right frontal white matter and left cerebellar hemisphere. Electronically Signed   By: Kristine Garbe M.D.   On: 05/31/2016 15:49   Dg Chest Port 1 View  Result Date: 05/31/2016 CLINICAL DATA:  Altered mental status EXAM: PORTABLE CHEST 1 VIEW COMPARISON:  01/19/2016 FINDINGS: Heart size normal. Ectasia of the thoracic aorta. Negative for heart failure. Negative for pneumonia or effusion. Chronic right-sided rib fractures. Apical scarring bilaterally. Chronic shoulder arthropathy bilaterally. IMPRESSION: No active disease. Electronically Signed   By: Franchot Gallo M.D.   On: 05/31/2016 15:38    Assessment/Plan  Physical deconditioning From generalized weakness.Will have her work with physical therapy and occupational therapy team to help with gait training and muscle strengthening exercises.fall precautions. Skin care. Encourage to be out of bed.   Hyponatremia Status post IV fluids. Encouraged hydration. Improved na level on lab review. Monitor BMP periodically  Irritable bowel syndrome On dicyclomine twice a day and simethicone, continue current regimen and monitor. Encouraged hydration. Add imodium 2 mg daily as needed if has > 3 loose stool/day.  Stroke History of recent stroke. Continue aspirin and trig coated 325 mg daily Plavix 75 mg daily, atorvastatin 10 mg daily and monitor blood pressure reading. Continue donepezil to help with her memory. Supportive care to be provided  Rheumatoid arthritis No recent flareup. Continue prednisone 10 mg daily and arava  Anemia of chronic disease From a medical comorbidities. Monitor CBC periodically. Continue iron supplement with vitamin C  Hypertension Monitor blood pressure reading. Continue Lopressor 100 mg twice a day,  lisinopril 5 mg daily and amlodipine 10 mg daily  Leukocytosis Likely from being on prednisone, monitor wbc  Type 2 diabetes mellitus Lab Results  Component Value Date   HGBA1C 6.8 (H) 05/31/2016   Hemoglobin A1c is suggestive of controlled diabetes. Reviewed blood sugar reading showing blood sugars between 165-457 with 2 readings of 74 and 83 earlier in the morning.Currently on Levemir 5 units daily and Humalog sliding scale insulin. Add diabetic snack at bedtime. Continue atorvastatin  Urinary frequency Per husband has been there for few months and would like for her to be evaluated by urology. Per family, she has required foley in the past for urinary retention. Good urine output at present. Monitor for signs of obstruction and make urology referral  Chronic respiratory failure With history of asthma. Breathing currently stable. Continue her bronchodilator. Continue oxygen by nasal cannula  Right heel stage I pressure ulcer Pressure ulcer prophylaxis to be taken. Skin prep the area, allowing it to try and cover it with aborted form dressing  GERD Continue Protonix 40 mg daily. Patient advised to sit upright post meals  Chronic depression Continue Celexa 10 mg daily, mood currently stable   Goals of care: short term rehabilitation   Labs/tests ordered: bmp 06/18/16  Family/ staff Communication: reviewed care plan with patient, her family and nursing supervisor    Blanchie Serve, MD Internal Medicine [facility] [place], [place]  [address] Cell Phone (Monday-Friday 8 am - 5 pm): [phone] On Call: [phone] and follow prompts after 5 pm and on weekends Office Phone: [phone] Office Fax: [phone]

## 2016-07-03 ENCOUNTER — Emergency Department (HOSPITAL_COMMUNITY): Payer: Medicare Other

## 2016-07-03 ENCOUNTER — Emergency Department (HOSPITAL_COMMUNITY)
Admission: EM | Admit: 2016-07-03 | Discharge: 2016-07-14 | Disposition: E | Payer: Medicare Other | Attending: Emergency Medicine | Admitting: Emergency Medicine

## 2016-07-03 ENCOUNTER — Encounter (HOSPITAL_COMMUNITY): Payer: Self-pay | Admitting: Emergency Medicine

## 2016-07-03 DIAGNOSIS — Z515 Encounter for palliative care: Secondary | ICD-10-CM | POA: Diagnosis not present

## 2016-07-03 DIAGNOSIS — R570 Cardiogenic shock: Secondary | ICD-10-CM | POA: Diagnosis not present

## 2016-07-03 DIAGNOSIS — T797XXA Traumatic subcutaneous emphysema, initial encounter: Secondary | ICD-10-CM | POA: Diagnosis not present

## 2016-07-03 DIAGNOSIS — Z7982 Long term (current) use of aspirin: Secondary | ICD-10-CM | POA: Insufficient documentation

## 2016-07-03 DIAGNOSIS — I252 Old myocardial infarction: Secondary | ICD-10-CM | POA: Insufficient documentation

## 2016-07-03 DIAGNOSIS — Z794 Long term (current) use of insulin: Secondary | ICD-10-CM | POA: Insufficient documentation

## 2016-07-03 DIAGNOSIS — E872 Acidosis, unspecified: Secondary | ICD-10-CM

## 2016-07-03 DIAGNOSIS — Z4682 Encounter for fitting and adjustment of non-vascular catheter: Secondary | ICD-10-CM | POA: Diagnosis not present

## 2016-07-03 DIAGNOSIS — J982 Interstitial emphysema: Secondary | ICD-10-CM | POA: Insufficient documentation

## 2016-07-03 DIAGNOSIS — G931 Anoxic brain damage, not elsewhere classified: Secondary | ICD-10-CM | POA: Diagnosis not present

## 2016-07-03 DIAGNOSIS — N183 Chronic kidney disease, stage 3 (moderate): Secondary | ICD-10-CM | POA: Insufficient documentation

## 2016-07-03 DIAGNOSIS — N179 Acute kidney failure, unspecified: Secondary | ICD-10-CM | POA: Insufficient documentation

## 2016-07-03 DIAGNOSIS — E1122 Type 2 diabetes mellitus with diabetic chronic kidney disease: Secondary | ICD-10-CM | POA: Insufficient documentation

## 2016-07-03 DIAGNOSIS — J449 Chronic obstructive pulmonary disease, unspecified: Secondary | ICD-10-CM | POA: Insufficient documentation

## 2016-07-03 DIAGNOSIS — Z85828 Personal history of other malignant neoplasm of skin: Secondary | ICD-10-CM | POA: Insufficient documentation

## 2016-07-03 DIAGNOSIS — S2243XA Multiple fractures of ribs, bilateral, initial encounter for closed fracture: Secondary | ICD-10-CM | POA: Diagnosis not present

## 2016-07-03 DIAGNOSIS — J9601 Acute respiratory failure with hypoxia: Secondary | ICD-10-CM | POA: Diagnosis not present

## 2016-07-03 DIAGNOSIS — I129 Hypertensive chronic kidney disease with stage 1 through stage 4 chronic kidney disease, or unspecified chronic kidney disease: Secondary | ICD-10-CM | POA: Insufficient documentation

## 2016-07-03 DIAGNOSIS — Z8673 Personal history of transient ischemic attack (TIA), and cerebral infarction without residual deficits: Secondary | ICD-10-CM | POA: Insufficient documentation

## 2016-07-03 DIAGNOSIS — I469 Cardiac arrest, cause unspecified: Secondary | ICD-10-CM | POA: Insufficient documentation

## 2016-07-03 LAB — I-STAT TROPONIN, ED: Troponin i, poc: 1.95 ng/mL (ref 0.00–0.08)

## 2016-07-03 LAB — HEPATIC FUNCTION PANEL
ALBUMIN: 2 g/dL — AB (ref 3.5–5.0)
ALT: 514 U/L — AB (ref 14–54)
AST: 1468 U/L — AB (ref 15–41)
Alkaline Phosphatase: 223 U/L — ABNORMAL HIGH (ref 38–126)
BILIRUBIN DIRECT: 0.2 mg/dL (ref 0.1–0.5)
BILIRUBIN TOTAL: 0.6 mg/dL (ref 0.3–1.2)
Indirect Bilirubin: 0.4 mg/dL (ref 0.3–0.9)
Total Protein: 4.2 g/dL — ABNORMAL LOW (ref 6.5–8.1)

## 2016-07-03 LAB — BASIC METABOLIC PANEL
Anion gap: 25 — ABNORMAL HIGH (ref 5–15)
BUN: 32 mg/dL — AB (ref 6–20)
CHLORIDE: 102 mmol/L (ref 101–111)
CO2: 12 mmol/L — ABNORMAL LOW (ref 22–32)
CREATININE: 2.26 mg/dL — AB (ref 0.44–1.00)
Calcium: 7.8 mg/dL — ABNORMAL LOW (ref 8.9–10.3)
GFR calc Af Amer: 23 mL/min — ABNORMAL LOW (ref >60)
GFR calc non Af Amer: 20 mL/min — ABNORMAL LOW (ref >60)
GLUCOSE: 337 mg/dL — AB (ref 65–99)
POTASSIUM: 5.4 mmol/L — AB (ref 3.5–5.1)
SODIUM: 139 mmol/L (ref 135–145)

## 2016-07-03 LAB — CBC
HEMATOCRIT: 29 % — AB (ref 36.0–46.0)
Hemoglobin: 8.5 g/dL — ABNORMAL LOW (ref 12.0–15.0)
MCH: 29.7 pg (ref 26.0–34.0)
MCHC: 29.3 g/dL — AB (ref 30.0–36.0)
MCV: 101.4 fL — AB (ref 78.0–100.0)
PLATELETS: 197 10*3/uL (ref 150–400)
RBC: 2.86 MIL/uL — ABNORMAL LOW (ref 3.87–5.11)
RDW: 16.1 % — AB (ref 11.5–15.5)
WBC: 10.3 10*3/uL (ref 4.0–10.5)

## 2016-07-03 LAB — I-STAT CHEM 8, ED
BUN: 45 mg/dL — AB (ref 6–20)
CALCIUM ION: 0.84 mmol/L — AB (ref 1.15–1.40)
CHLORIDE: 105 mmol/L (ref 101–111)
CREATININE: 1.8 mg/dL — AB (ref 0.44–1.00)
Glucose, Bld: 327 mg/dL — ABNORMAL HIGH (ref 65–99)
HCT: 26 % — ABNORMAL LOW (ref 36.0–46.0)
Hemoglobin: 8.8 g/dL — ABNORMAL LOW (ref 12.0–15.0)
POTASSIUM: 5.1 mmol/L (ref 3.5–5.1)
Sodium: 135 mmol/L (ref 135–145)
TCO2: 14 mmol/L (ref 0–100)

## 2016-07-03 LAB — I-STAT CG4 LACTIC ACID, ED: Lactic Acid, Venous: >17 mmol/L (ref 0.5–1.9)

## 2016-07-03 MED ORDER — EPINEPHRINE PF 1 MG/ML IJ SOLN
0.5000 ug/min | INTRAVENOUS | Status: DC
  Administered 2016-07-03: 4 ug/min via INTRAVENOUS
  Filled 2016-07-03: qty 4

## 2016-07-03 MED ORDER — EPINEPHRINE PF 1 MG/10ML IJ SOSY
PREFILLED_SYRINGE | INTRAMUSCULAR | Status: AC | PRN
  Administered 2016-07-03: 100 ug via INTRAVENOUS

## 2016-07-03 MED ORDER — DEXTROSE 5 % IV SOLN: 500.0000 mg | INTRAVENOUS | Status: DC

## 2016-07-03 MED ORDER — DEXTROSE 5 % IV SOLN
2.0000 g | Freq: Once | INTRAVENOUS | Status: DC
  Filled 2016-07-03: qty 2

## 2016-07-03 MED ORDER — VANCOMYCIN HCL IN DEXTROSE 1-5 GM/200ML-% IV SOLN
1000.0000 mg | Freq: Once | INTRAVENOUS | Status: DC
  Filled 2016-07-03: qty 200

## 2016-07-03 MED ORDER — SODIUM CHLORIDE 0.9 % IV BOLUS (SEPSIS)
1000.0000 mL | Freq: Once | INTRAVENOUS | Status: AC
  Administered 2016-07-03: 1000 mL via INTRAVENOUS

## 2016-07-03 MED ORDER — DEXTROSE 5 % IV SOLN: 2.0000 g | Freq: Once | INTRAVENOUS | Status: DC

## 2016-07-03 MED ORDER — LEVOFLOXACIN IN D5W 750 MG/150ML IV SOLN: 750.0000 mg | Freq: Once | INTRAVENOUS | Status: DC

## 2016-07-03 MED ORDER — VANCOMYCIN HCL 500 MG IV SOLR: 500.0000 mg | INTRAVENOUS | Status: DC

## 2016-07-08 LAB — CULTURE, BLOOD (ROUTINE X 2): Culture: NO GROWTH

## 2016-07-14 NOTE — Progress Notes (Signed)
PT was terminally extubated to room air

## 2016-07-14 NOTE — ED Provider Notes (Signed)
[facility] DEPT Provider Note   CSN: [id number] Arrival date & time: 30-Jul-2016 7517     History    Chief Complaint  Patient presents with  . Cardiac Arrest     HPI Marisa [name] is a 76 y.o. female.  76yo F w/ extensive PMH below including recent hospitalization for sepsis who p/w cardiac arrest. Per EMS report, pt was doing ok this morning and had a bath and breakfast w/ staff, last seen normal at 7am. At 7:30am, she was found unresponsive, pulseless, and apneic. CPR was initiated on scene and when EMS arrived her initial rhythm was asystole. She received a total of 5 doses of epinephrine and ROSC was achieved in route. BG normal. King airway was placed with no difficulty bagging.   LEVEL 5 CAVEAT DUE TO UNRESPONSIVENESS  Past Medical History:  Diagnosis Date  . Anemia   . Arthritis   . Asthma    Home 3L O2   . Cancer ([place])   . Chronic anemia   . Depression    patient feels depressed at the end of the month  . Diabetes mellitus type 2, insulin dependent ([place])    initial diagnoses 11/2008  . Diabetes mellitus without complication ([place])   . Diabetic ketoacidosis ([place]) 11/2010  . Diverticulosis   . Esophageal stricture 06/2004   Dilation 06/2004  . Family history of anesthesia complication    DAUGHTER HAS NAUSEA  . GERD (gastroesophageal reflux disease)   . history of  pericarditis 12/2002  . History of blood transfusion   . Hypertension   . Myocardial infarction    Chest pain s/p normal cath in 08/2004 then NSTEMI during 11/2010 admission, negative Myoview  . Osteoarthritis   . Oxygen deficiency   . Pericardial effusion   . Rectal bleeding 11/2010   Large hemorrhoids  . Rheumatoid arteritis   . Rheumatoid arthritis(714.0)    On MTX and chronic steroids  . Seizure ([place]) 11/2008  . Supraventricular tachycardia [facility])      Patient Active Problem List   Diagnosis Date Noted  . Pressure injury of skin 06/04/2016  . Hyponatremia 05/31/2016  . Acute renal failure  (ARF) ([place]) 05/31/2016  . Current chronic use of systemic steroids 05/31/2016  . Cerebrovascular accident (CVA) ([place])   . Uncontrolled type 2 diabetes mellitus with complication ([place])   . CKD (chronic kidney disease), stage III   . Chronic respiratory failure with hypoxia ([place])   . Panlobular emphysema ([place])   . Stroke ([place])   . Diplopia   . Brainstem infarct, acute ([place]) 05/14/2016  . Aortic dissection ([place]) 05/14/2016  . Diabetes mellitus with complication ([place])   . Rib fractures 01/18/2016  . Chronic renal insufficiency, stage 3 (moderate)   . Memory loss 05/09/2015  . Immunosuppressed status ([place])   . Shoulder effusion 05/28/2014  . Rheumatoid arthritis ([place]) 05/28/2014  . Insulin dependent type 2 diabetes mellitus, uncontrolled ([place]) 05/28/2014  . Symptomatic anemia 04/13/2014  . Iron deficiency anemia due to chronic blood loss 03/07/2013  . Chronic respiratory failure ([place]) 02/11/2013  . 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 Personal history of colonic polyps 01/05/2013  . Protein calorie malnutrition ([place]) 10/08/2011  . Sepsis ([place]) 09/29/2011  . Diabetes mellitus type 2, insulin dependent ([place])   . Anxiety state 06/08/2007  . HTN (hypertension) 06/08/2007  . UNSPECIFIED ACUTE PERICARDITIS 06/08/2007  . SINUS ARRHYTHMIA 06/08/2007  . HEMORRHOIDS, INTERNAL 06/08/2007  . ALLERGIC RHINITIS 06/08/2007  . COPD (chronic obstructive pulmonary disease) ([place]) 06/08/2007  .  ESOPHAGEAL STRICTURE 06/08/2007  . GERD 06/08/2007  . CONSTIPATION, CHRONIC 06/08/2007  . IRRITABLE BOWEL SYNDROME 06/08/2007  . SLEEP APNEA 06/08/2007  . HEADACHE, CHRONIC 06/08/2007  . NEOPLASM, MALIGNANT, CARCINOMA, BASAL CELL, NOSE 06/08/2007    Past Surgical History:  Procedure Laterality Date  . ABDOMINAL HYSTERECTOMY  1980  . CHOLECYSTECTOMY    . COLONOSCOPY N/A 01/06/2013   Procedure: COLONOSCOPY;  Surgeon: Ladene Artist, MD;  Location: [facility] ENDOSCOPY;  Service: Endoscopy;  Laterality: N/A;  .  ESOPHAGOGASTRODUODENOSCOPY N/A 02/11/2013   Procedure: ESOPHAGOGASTRODUODENOSCOPY (EGD);  Surgeon: Irene Shipper, MD;  Location: [facility] ENDOSCOPY;  Service: Endoscopy;  Laterality: N/A;  . INCISE AND DRAIN ABCESS  09/2011   I&D of peri-rectal abcess per Dr Zella Richer.     OB History    Gravida Para Term Preterm AB Living   0 0 0 0 0     SAB TAB Ectopic Multiple Live Births   0 0 0            Home Medications    Prior to Admission medications   Medication Sig Start Date End Date Taking? Authorizing Provider  acetaminophen (TYLENOL) 650 MG CR tablet Take 650 mg by mouth 3 (three) times daily.    Historical Provider, MD  amLODipine (NORVASC) 10 MG tablet Take 1 tablet (10 mg total) by mouth daily. 05/24/16   Allie Bossier, MD  aspirin EC 325 MG tablet Take 325 mg by mouth daily.    Historical Provider, MD  citalopram (CELEXA) 10 MG tablet Take 1 tablet (10 mg total) by mouth daily. 05/24/16   Allie Bossier, MD  clopidogrel (PLAVIX) 75 MG tablet Take 1 tablet (75 mg total) by mouth daily. 05/24/16   Allie Bossier, MD  Coenzyme Q10 (CO Q 10) 100 MG CAPS Take 100 mg by mouth daily.    Historical Provider, MD  dicyclomine (BENTYL) 10 MG/5ML syrup Take 5 mLs (10 mg total) by mouth 2 (two) times daily. 05/23/16   Allie Bossier, MD  diphenhydrAMINE (BENADRYL) 25 MG tablet Take 25 mg by mouth every 8 (eight) hours as needed for allergies.     Historical Provider, MD  docusate sodium (COLACE) 100 MG capsule Take 1 capsule (100 mg total) by mouth daily as needed for mild constipation. 05/23/16   Allie Bossier, MD  donepezil (ARICEPT) 10 MG tablet Take 1 tablet (10 mg total) by mouth at bedtime. 05/23/16   Allie Bossier, MD  estrogens, conjugated, (PREMARIN) 0.625 MG tablet Take 1 tablet (0.625 mg total) by mouth daily. 05/23/16   Allie Bossier, MD  Fe Fum-FePoly-Vit C-Vit B3 (INTEGRA) 62.5-62.5-40-3 MG CAPS Take 1 capsule by mouth 2 (two) times daily.    Historical Provider, MD  fluticasone (FLONASE) 50  MCG/ACT nasal spray Place 2 sprays into both nostrils daily as needed for allergies or rhinitis. 05/23/16   Allie Bossier, MD  Glucosamine-Fish Oil-EPA-DHA (GLUCOSAMINE & FISH OIL PO) Take 1 capsule by mouth 2 (two) times daily.     Historical Provider, MD  insulin detemir (LEVEMIR) 100 UNIT/ML injection Inject 0.05 mLs (5 Units total) into the skin at bedtime. 06/04/16   Verlee Monte, MD  insulin lispro (HUMALOG) 100 UNIT/ML injection Inject 0-9 Units into the skin 3 (three) times daily before meals.    Historical Provider, MD  ipratropium-albuterol (DUONEB) 0.5-2.5 (3) MG/3ML SOLN Take 3 mLs by nebulization every 6 (six) hours as needed (for wheezing/shortness of breath).    Historical Provider, MD  leflunomide (ARAVA) 20 MG tablet Take 1 tablet (20 mg total) by mouth daily. 05/23/16   Allie Bossier, MD  levalbuterol [facility] HFA) 45 MCG/ACT inhaler Inhale 2 puffs into the lungs every 4 (four) hours as needed for wheezing or shortness of breath.    Historical Provider, MD  Lidocaine-Menthol (ICY HOT LIDOCAINE PLUS MENTHOL) 4-1 % PTCH Apply 1 patch topically every 12 (twelve) hours.    Historical Provider, MD  lisinopril (PRINIVIL,ZESTRIL) 5 MG tablet Take 1 tablet (5 mg total) by mouth daily. 05/24/16   Allie Bossier, MD  LORazepam (ATIVAN) 0.5 MG tablet Take 1 tablet (0.5 mg total) by mouth every 4 (four) hours as needed for anxiety. 06/04/16   Verlee Monte, MD  Menthol-Zinc Oxide (CALMOSEPTINE EX) Apply 1 application topically 3 (three) times daily.    Historical Provider, MD  metoprolol (LOPRESSOR) 100 MG tablet Take 1 tablet (100 mg total) by mouth 2 (two) times daily. 05/23/16   Allie Bossier, MD  Misc Natural Products (ESTROVEN ENERGY PO) Take 1 capsule by mouth daily.    Historical Provider, MD  Multiple Vitamin (MULTIVITAMIN WITH MINERALS) TABS tablet Take 1 tablet by mouth daily.    Historical Provider, MD  pantoprazole (PROTONIX) 40 MG tablet Take 40 mg by mouth daily.    Historical Provider, MD    polyvinyl alcohol (LIQUIFILM TEARS) 1.4 % ophthalmic solution Place 1 drop into both eyes as needed for dry eyes.    Historical Provider, MD  predniSONE (DELTASONE) 10 MG tablet Take 1 tablet (10 mg total) by mouth daily. 05/24/16   Allie Bossier, MD  simethicone (MYLICON) 40 QP/6.1PJ drops Take 1.2 mLs (80 mg total) by mouth 4 (four) times daily. 05/23/16   Allie Bossier, MD  traMADol (ULTRAM) 50 MG tablet Take 50 mg by mouth every 8 (eight) hours as needed.    Historical Provider, MD  UNABLE TO FIND Med Name: Med pas 120 mL by mouth 2 times daily    Historical Provider, MD  vitamin C (ASCORBIC ACID) 500 MG tablet Take 500 mg by mouth 2 (two) times daily.     Historical Provider, MD  VITAMIN K PO Take 100 mcg by mouth daily.    Historical Provider, MD      Family History  Problem Relation Age of Onset  . Heart disease Mother   . Stroke Father   . Dementia Neg Hx      Social History  Substance Use Topics  . Smoking status: Never Smoker  . Smokeless tobacco: Never Used  . Alcohol use No     Allergies     Demerol [meperidine]; Penicillins; Enbrel [etanercept]; and Humira [adalimumab]    Review of Systems  Unable to obtain ROS 2/2 unresponsiveness  Physical Exam Updated Vital Signs BP 107/61   Pulse (!) 58   Resp 18   Ht 4\' 7"  (1.397 m)   Wt 105 lb (47.6 kg)   SpO2 100%   BMI 24.40 kg/m   Physical Exam  Constitutional:  Elderly, unresponsive woman being bagged  HENT:  Extensive subcutaneous emphysema tracking up face into eyelids Small laceration tip of tongue  Eyes:  Pupils mid-fixed, non-reactive to light  Neck:  Extensive subcutaneous emphysema neck  Cardiovascular: Normal rate, regular rhythm, normal heart sounds and intact distal pulses.   Pulmonary/Chest:  Coarse breath sounds w/ bagging, no spontaneous respirations Extensive subcutaneous emphysema with crepitus on entire chest wall extending down abdomen  Abdominal: Soft. She exhibits no distension.  Musculoskeletal: She exhibits edema.  Subcutaneous emphysema on bilateral upper arms and shoulders  Neurological:  GCS 3, unresponsive  Skin:  Scattered old ecchymoses bilateral arms  Nursing note and vitals reviewed.     ED Treatments / Results  Labs (all labs ordered are listed, but only abnormal results are displayed) Labs Reviewed  BASIC METABOLIC PANEL - Abnormal; Notable for the following:       Result Value   Potassium 5.4 (*)    CO2 12 (*)    Glucose, Bld 337 (*)    BUN 32 (*)    Creatinine, Ser 2.26 (*)    Calcium 7.8 (*)    GFR calc non Af Amer 20 (*)    GFR calc Af Amer 23 (*)    Anion gap 25 (*)    All other components within normal limits  CBC - Abnormal; Notable for the following:    RBC 2.86 (*)    Hemoglobin 8.5 (*)    HCT 29.0 (*)    MCV 101.4 (*)    MCHC 29.3 (*)    RDW 16.1 (*)    All other components within normal limits  HEPATIC FUNCTION PANEL - Abnormal; Notable for the following:    Total Protein 4.2 (*)    Albumin 2.0 (*)    AST 1,468 (*)    ALT 514 (*)    Alkaline Phosphatase 223 (*)    All other components within normal limits  I-STAT TROPOININ, ED - Abnormal; Notable for the following:    Troponin i, poc 1.95 (*)    All other components within normal limits  I-STAT CHEM 8, ED - Abnormal; Notable for the following:    BUN 45 (*)    Creatinine, Ser 1.80 (*)    Glucose, Bld 327 (*)    Calcium, Ion 0.84 (*)    Hemoglobin 8.8 (*)    HCT 26.0 (*)    All other components within normal limits  I-STAT CG4 LACTIC ACID, ED - Abnormal; Notable for the following:    Lactic Acid, Venous >17.00 (*)    All other components within normal limits  URINE CULTURE  CULTURE, BLOOD (ROUTINE X 2)  CULTURE, BLOOD (ROUTINE X 2)  URINALYSIS, ROUTINE W REFLEX MICROSCOPIC  I-STAT CG4 LACTIC ACID, ED     EKG  EKG Interpretation  Date/Time:  07/09/16 09:10:36 EDT Ventricular Rate:  74 PR Interval:    QRS Duration: 116 QT  Interval:  421 QTC Calculation: 468 R Axis:   103 Text Interpretation:  Sinus rhythm Borderline short PR interval Consider right atrial enlargement Incomplete right bundle branch block Probable lateral infarct, old Anteroseptal infarct, age indeterminate inferolateral T wave inversions more pronounced than previous Confirmed by Kavi Almquist MD, Tytan Sandate [phone]) on 09-Jul-2016 9:46:00 AM         Radiology Dg Chest Port 1 View  Addendum Date: 07-09-16   ADDENDUM REPORT: 2016-07-09 10:13 ADDENDUM: Critical Value/emergent results were called by telephone at the time of interpretation on 2016/07/09 at 1008 hours Dr. Apolonio Schneiders Dorean Hiebert , who verbally acknowledged these results. Electronically Signed   By: Genevie Ann M.D.   On: Jul 09, 2016 10:13   Result Date: 07/09/16 CLINICAL DATA:  76 year old female status post CPR and intubation. Repositioning of endotracheal tube. EXAM: PORTABLE CHEST 1 VIEW COMPARISON:  0940 hours today, and earlier FINDINGS: Portable AP supine view at 0951 hours. Endotracheal tube tip is now about 10 mm above the carina. The enteric tube courses to the left  upper quadrant as before. Severe bilateral subcutaneous emphysema persists and a moderate size right pneumothorax is stable. There is mild leftward shift of the mediastinum, unchanged. Bilateral rib fractures are difficult to delineate on this image. Mediastinal contour remains stable. The left lung is grossly clear. Negative visible bowel gas pattern. IMPRESSION: 1. Improved ET tube position, tip now about 10 mm above the carina. 2. Stable moderate size right pneumothorax, with a degree of tension given stable mild leftward shift of the mediastinum. 3. No areas of worsening ventilation. 4. Severe bilateral subcutaneous emphysema. Bilateral rib fractures difficult to delineate on the-there Electronically Signed: By: Genevie Ann M.D. On: 09-Jul-2016 10:05   Dg Chest Portable 1 View  Result Date: 2016-07-09 CLINICAL DATA:  Status post CPR and recent  endotracheal tube placement EXAM: PORTABLE CHEST 1 VIEW COMPARISON:  05/31/2016 FINDINGS: Considerable subcutaneous emphysema is identified throughout the chest and extending into the neck. An endotracheal tube and nasogastric catheter are noted. Nasogastric catheter is coiled within the stomach. The endotracheal tube is noted deep within the right mainstem bronchus and should be withdrawn at least 5-6 cm. A right-sided pneumothorax is noted particularly laterally with approximately 1 cm excursion throughout the right lung. Multiple bilateral rib fractures are noted. Some of these were present on the prior exam but have also increased likely related to the recent CPR. No definitive infiltrate is seen. Some linear increased density is noted overlying the right base although this is felt to be extrinsic to the patient related to the subcutaneous emphysema. IMPRESSION: Right-sided pneumothorax Multiple rib fractures Right mainstem bronchus intubation Critical Value/emergent results were called by telephone at the time of interpretation on 07-09-16 at 9:54 am to Dr. Theotis Burrow , who verbally acknowledged these results. Electronically Signed   By: Inez Catalina M.D.   On: 09-Jul-2016 09:54    Procedures Procedures (including critical care time) .Critical Care Performed by: Sharlett Iles Authorized by: Sharlett Iles   Critical care provider statement:    Critical care time (minutes):  60   Critical care time was exclusive of:  Separately billable procedures and treating other patients   Critical care was necessary to treat or prevent imminent or life-threatening deterioration of the following conditions:  Cardiac failure   Critical care was time spent personally by me on the following activities:  Development of treatment plan with patient or surrogate, evaluation of patient's response to treatment, examination of patient, obtaining history from patient or surrogate, ordering and performing  treatments and interventions, ordering and review of radiographic studies, ordering and review of laboratory studies, re-evaluation of patient's condition, pulse oximetry and review of old charts   INTUBATION Performed by: Waynetta Pean, PA-C  Required items: required blood products, implants, devices, and special equipment available Patient identity confirmed: provided demographic data and hospital-assigned identification number Time out: unable to obtain due to emergent conditions  Indications: acute respiratory failure  Intubation method: Glidescope Laryngoscopy   Preoxygenation: Edison Pace airway  Sedatives: none Paralytic: none  Tube Size: 7.5 cuffed  Post-procedure assessment: chest rise and ETCO2 monitor Breath sounds: equal and absent over the epigastrium Tube secured with: ETT holder Chest x-ray interpreted by radiologist and me.  Chest x-ray findings: endotracheal tube in R mainstem, retracted 2 cm with appropriate position on repeat CXR  Patient tolerated the procedure well with no immediate complications.    Medications Ordered in ED  Medications  EPINEPHrine (ADRENALIN) 4 mg in dextrose 5 % 250 mL (0.016 mg/mL) infusion (0 mcg/min  Intravenous Stopped 07/21/16 1128)  vancomycin (VANCOCIN) IVPB 1000 mg/200 mL premix (0 mg Intravenous Hold 21-Jul-2016 1022)  ceFEPIme (MAXIPIME) 2 g in dextrose 5 % 50 mL IVPB (0 g Intravenous Hold 2016/07/21 1022)  vancomycin (VANCOCIN) 500 mg in sodium chloride 0.9 % 100 mL IVPB (not administered)  ceFEPIme (MAXIPIME) 500 mg in dextrose 5 % 50 mL IVPB (not administered)  EPINEPHrine (ADRENALIN) 1 MG/10ML injection (100 mcg Intravenous Given 2016/07/21 0923)  sodium chloride 0.9 % bolus 1,000 mL (0 mLs Intravenous Stopped 07/21/16 1128)  sodium chloride 0.9 % bolus 1,000 mL (0 mLs Intravenous Stopped 2016-07-21 1128)     Initial Impression / Assessment and Plan / ED Course  I have reviewed the triage vital signs and the nursing notes.  Pertinent  labs & imaging results that were available during my care of the patient were reviewed by me and considered in my medical decision making (see chart for details).     Pt presents in cardiopulmonary arrest from nursing facility, ROSC obtained after CPR and several doses of epi. On arrival, she was being bagged with [facility] airway in place, femoral pulse present, hypotensive, unresponsive with GCS of 3. Initiated epi drip, started fluids, exchanged Goff airway for ETT without sedation or paralytic. CXR showed R mainstem, retracted with success. Pt w/ R pneumothorax and multiple rib fx from CPR. Contacted CCM, Dr. Nelda Marseille evaluated pt. I spoke with husband on phone and later he and family arrived and had discussion w/ Dr. Nelda Marseille. They decided to withdraw care given futility. Pt transitioned to comfort care measures and expired at 11:44am. I contacted her PCP office and discussed w/ Dr. Shelia Media, who will inform pt's PCP Dr. Maudie Mercury and Dr. Maudie Mercury will sign death certificate. Chaplain present with family and all questions/concerns addressed.  Final Clinical Impressions(s) / ED Diagnoses   Final diagnoses:  Cardiopulmonary arrest ([place])  Lactic acidosis  AKI (acute kidney injury) ([place])  Subcutaneous emphysema, initial encounter [facility])     New Prescriptions   No medications on file       Sharlett Iles, MD 21-Jul-2016 1328

## 2016-07-14 NOTE — Progress Notes (Signed)
Pharmacy Antibiotic Note  Marisa [name] is a 76 y.o. female admitted on 07-21-2016 with sepsis.  Pharmacy has been consulted for vancomycin and cefepime dosing. Of note, patient has a penicillin allergy with a reaction of anaphylaxis but she has tolerated cephalosporins in the past. WBC is currently pending and no temperature documented. Scr is elevated at 2.26, baseline is <1.   Plan: Vancomycin 1gm IV x 1 then 500mg  IV Q48H Cefepime 2gm IV x 1 then 500mg  IV Q24H F/u renal fxn, C&S, clinical status and trough at [facility]     No data recorded.   Recent Labs Lab 07-21-16 0910 07-21-16 0922 21-Jul-2016 0923  WBC PENDING  --   --   CREATININE 2.26* 1.80*  --   LATICACIDVEN  --   --  >17.00*    CrCl cannot be calculated (Unknown ideal weight.).    Allergies  Allergen Reactions  . Demerol [Meperidine] Other (See Comments)    Reaction:  Hallucinations  . Penicillins Anaphylaxis and Other (See Comments)    Has patient had a PCN reaction causing immediate rash, facial/tongue/throat swelling, SOB or lightheadedness with hypotension: Yes Has patient had a PCN reaction causing severe rash involving mucus membranes or skin necrosis: No Has patient had a PCN reaction that required hospitalization No Has patient had a PCN reaction occurring within the last 10 years: No If all of the above answers are "NO", then may proceed with Cephalosporin use.  . Enbrel [Etanercept] Swelling and Other (See Comments)    Reaction:  Arm swelling   . Humira [Adalimumab] Swelling and Other (See Comments)    Reaction:  Arm swelling     Antimicrobials this admission: Vanc 3/21>> Cefepime 3/21>>  Dose adjustments this admission: N/A  Microbiology results: Pending  Thank you for allowing pharmacy to be a part of this patient's care.  Dashanna Kinnamon, Rande Lawman 21-Jul-2016 9:53 AM

## 2016-07-14 NOTE — Code Documentation (Signed)
Phlebotomy at bedside drawing blood cultures at this time. Antibiotics to be started after.

## 2016-07-14 NOTE — ED Notes (Signed)
Care withdrawn at this time. ETT removed by RT. Epi drip discontinued. Family at bedside, chaplain present.

## 2016-07-14 NOTE — ED Notes (Signed)
Per CCM, patient status now full DNR, family at bedside. Requesting arrival of daughter prior to removing lines and tubes.

## 2016-07-14 NOTE — Consult Note (Addendum)
PULMONARY / CRITICAL CARE MEDICINE   Name: Marisa [name] MRN: [id number] DOB: 1940-12-27    ADMISSION DATE:  July 23, 2016 CONSULTATION DATE:  Jul 23, 2016  REFERRING MD:  EDP - Little  CHIEF COMPLAINT:  Cardiac arrest and PTX  HISTORY OF PRESENT ILLNESS:   76 year old with extensive PMH who presents to the ED from SNF after being found unresponsive in [facility].  Last seen conscious 30 minutes prior to being found in [facility].  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  Transferred to SNF and changed to full code.  Patient was resuscitated, multiple rib fracture, PTX and severe SQ emphysema.  ROSC after 30 additional minutes.  On an epi drip at 32 mcg.  PAST MEDICAL HISTORY :  She  has a past medical history of Anemia; Arthritis; Asthma; Cancer ([place]); Chronic anemia; Depression; Diabetes mellitus type 2, insulin dependent ([place]); Diabetes mellitus without complication ([place]); Diabetic ketoacidosis ([place]) (11/2010); Diverticulosis; Esophageal stricture (06/2004); Family history of anesthesia complication; GERD (gastroesophageal reflux disease); history of  pericarditis (12/2002); History of blood transfusion; Hypertension; Myocardial infarction; Osteoarthritis; Oxygen deficiency; Pericardial effusion; Rectal bleeding (11/2010); Rheumatoid arteritis; Rheumatoid arthritis(714.0); Seizure ([place]) (11/2008); and Supraventricular tachycardia ([place]).  PAST SURGICAL HISTORY: She  has a past surgical history that includes Cholecystectomy; Abdominal hysterectomy (1980); Incise and drain abcess (09/2011); Colonoscopy (N/A, 01/06/2013); and Esophagogastroduodenoscopy (N/A, 02/11/2013).  Allergies  Allergen Reactions  . Demerol [Meperidine] Other (See Comments)    Reaction:  Hallucinations  . Penicillins Anaphylaxis and Other (See Comments)    Has patient had a PCN reaction causing immediate rash, facial/tongue/throat swelling, SOB or lightheadedness with hypotension: Yes Has patient had  a PCN reaction causing severe rash involving mucus membranes or skin necrosis: No Has patient had a PCN reaction that required hospitalization No Has patient had a PCN reaction occurring within the last 10 years: No If all of the above answers are "NO", then may proceed with Cephalosporin use.  . Enbrel [Etanercept] Swelling and Other (See Comments)    Reaction:  Arm swelling   . Humira [Adalimumab] Swelling and Other (See Comments)    Reaction:  Arm swelling     No current facility-administered medications on file prior to encounter.    Current Outpatient Prescriptions on File Prior to Encounter  Medication Sig  . acetaminophen (TYLENOL) 650 MG CR tablet Take 650 mg by mouth 3 (three) times daily.  Marland Kitchen amLODipine (NORVASC) 10 MG tablet Take 1 tablet (10 mg total) by mouth daily.  Marland Kitchen aspirin EC 325 MG tablet Take 325 mg by mouth daily.  . citalopram (CELEXA) 10 MG tablet Take 1 tablet (10 mg total) by mouth daily.  . clopidogrel (PLAVIX) 75 MG tablet Take 1 tablet (75 mg total) by mouth daily.  . Coenzyme Q10 (CO Q 10) 100 MG CAPS Take 100 mg by mouth daily.  Marland Kitchen dicyclomine (BENTYL) 10 MG/5ML syrup Take 5 mLs (10 mg total) by mouth 2 (two) times daily.  . diphenhydrAMINE (BENADRYL) 25 MG tablet Take 25 mg by mouth every 8 (eight) hours as needed for allergies.   Marland Kitchen docusate sodium (COLACE) 100 MG capsule Take 1 capsule (100 mg total) by mouth daily as needed for mild constipation.  Marland Kitchen donepezil (ARICEPT) 10 MG tablet Take 1 tablet (10 mg total) by mouth at bedtime.  Marland Kitchen estrogens, conjugated, (PREMARIN) 0.625 MG tablet Take 1 tablet (0.625 mg total) by mouth daily.  . Fe Fum-FePoly-Vit C-Vit B3 (INTEGRA) 62.5-62.5-40-3  MG CAPS Take 1 capsule by mouth 2 (two) times daily.  . fluticasone (FLONASE) 50 MCG/ACT nasal spray Place 2 sprays into both nostrils daily as needed for allergies or rhinitis.  . Glucosamine-Fish Oil-EPA-DHA (GLUCOSAMINE & FISH OIL PO) Take 1 capsule by mouth 2 (two) times  daily.   . insulin detemir (LEVEMIR) 100 UNIT/ML injection Inject 0.05 mLs (5 Units total) into the skin at bedtime.  . insulin lispro (HUMALOG) 100 UNIT/ML injection Inject 0-9 Units into the skin 3 (three) times daily before meals.  Marland Kitchen ipratropium-albuterol (DUONEB) 0.5-2.5 (3) MG/3ML SOLN Take 3 mLs by nebulization every 6 (six) hours as needed (for wheezing/shortness of breath).  . leflunomide (ARAVA) 20 MG tablet Take 1 tablet (20 mg total) by mouth daily.  Marland Kitchen levalbuterol (XOPENEX HFA) 45 MCG/ACT inhaler Inhale 2 puffs into the lungs every 4 (four) hours as needed for wheezing or shortness of breath.  . Lidocaine-Menthol (ICY HOT LIDOCAINE PLUS MENTHOL) 4-1 % PTCH Apply 1 patch topically every 12 (twelve) hours.  Marland Kitchen lisinopril (PRINIVIL,ZESTRIL) 5 MG tablet Take 1 tablet (5 mg total) by mouth daily.  Marland Kitchen LORazepam (ATIVAN) 0.5 MG tablet Take 1 tablet (0.5 mg total) by mouth every 4 (four) hours as needed for anxiety.  . Menthol-Zinc Oxide (CALMOSEPTINE EX) Apply 1 application topically 3 (three) times daily.  . metoprolol (LOPRESSOR) 100 MG tablet Take 1 tablet (100 mg total) by mouth 2 (two) times daily.  . Misc Natural Products (ESTROVEN ENERGY PO) Take 1 capsule by mouth daily.  . Multiple Vitamin (MULTIVITAMIN WITH MINERALS) TABS tablet Take 1 tablet by mouth daily.  . pantoprazole (PROTONIX) 40 MG tablet Take 40 mg by mouth daily.  . polyvinyl alcohol (LIQUIFILM TEARS) 1.4 % ophthalmic solution Place 1 drop into both eyes as needed for dry eyes.  . predniSONE (DELTASONE) 10 MG tablet Take 1 tablet (10 mg total) by mouth daily.  . simethicone (MYLICON) 40 PP/5.0DT drops Take 1.2 mLs (80 mg total) by mouth 4 (four) times daily.  . traMADol (ULTRAM) 50 MG tablet Take 50 mg by mouth every 8 (eight) hours as needed.  Marland Kitchen UNABLE TO FIND Med Name: Med pas 120 mL by mouth 2 times daily  . vitamin C (ASCORBIC ACID) 500 MG tablet Take 500 mg by mouth 2 (two) times daily.   Marland Kitchen VITAMIN K PO Take 100 mcg  by mouth daily.    FAMILY HISTORY:  Her indicated that her mother is deceased. She indicated that her father is deceased. She indicated that the status of her neg hx is unknown.    SOCIAL HISTORY: She  reports that she has never smoked. She has never used smokeless tobacco. She reports that she does not drink alcohol or use drugs.  REVIEW OF SYSTEMS:   Unattainable  SUBJECTIVE:  Unresponsive  VITAL SIGNS: BP 111/63   Pulse (!) 55   Resp 11   Ht 4\' 7"  (1.397 m)   SpO2 100%   HEMODYNAMICS:    VENTILATOR SETTINGS: Vent Mode: PRVC FiO2 (%):  [100 %] 100 % Set Rate:  [18 bmp] 18 bmp Vt Set:  [310 mL] 310 mL PEEP:  [5 cmH20] 5 cmH20 Plateau Pressure:  [28 cmH20] 28 cmH20  INTAKE / OUTPUT: No intake/output data recorded.  PHYSICAL EXAMINATION: General:  Acutely on chronically ill appearing female with severe and diffuse subQ emphysema down to her legs. Neuro:  Unresponsive, no pupillary, corneal or gag reflex.  No respiratory drive. HEENT:  [place]/AT, pupils fixed and  dilated, no dolls eye and no EOM. Cardiovascular:  IRIR, Nl S1/S2, -M/R/G. Lungs:  Coarse BS diffusely with hypertympany in all lung fields Abdomen:  Soft, NT, ND and +BS Musculoskeletal:  -edema and -tenderness Skin:  Diffuse SQ air.  LABS:  BMET  Recent Labs Lab 07/25/2016 0910 2016/07/25 0922  NA 139 135  K 5.4* 5.1  CL 102 105  CO2 12*  --   BUN 32* 45*  CREATININE 2.26* 1.80*  GLUCOSE 337* 327*    Electrolytes  Recent Labs Lab 25-Jul-2016 0910  CALCIUM 7.8*    CBC  Recent Labs Lab 25-Jul-2016 0910 Jul 25, 2016 0922  WBC 10.3  --   HGB 8.5* 8.8*  HCT 29.0* 26.0*  PLT 197  --     Coag's No results for input(s): APTT, INR in the last 168 hours.  Sepsis Markers  Recent Labs Lab 25-Jul-2016 0923  LATICACIDVEN >17.00*    ABG No results for input(s): PHART, PCO2ART, PO2ART in the last 168 hours.  Liver Enzymes No results for input(s): AST, ALT, ALKPHOS, BILITOT, ALBUMIN in the last  168 hours.  Cardiac Enzymes No results for input(s): TROPONINI, PROBNP in the last 168 hours.  Glucose No results for input(s): GLUCAP in the last 168 hours.  Imaging Dg Chest Port 1 View  Addendum Date: 25-Jul-2016   ADDENDUM REPORT: 2016-07-25 10:13 ADDENDUM: Critical Value/emergent results were called by telephone at the time of interpretation on 07-25-2016 at 1008 hours Dr. Apolonio Schneiders LITTLE , who verbally acknowledged these results. Electronically Signed   By: Genevie Ann M.D.   On: 2016-07-25 10:13   Result Date: 2016-07-25 CLINICAL DATA:  76 year old female status post CPR and intubation. Repositioning of endotracheal tube. EXAM: PORTABLE CHEST 1 VIEW COMPARISON:  0940 hours today, and earlier FINDINGS: Portable AP supine view at 0951 hours. Endotracheal tube tip is now about 10 mm above the carina. The enteric tube courses to the left upper quadrant as before. Severe bilateral subcutaneous emphysema persists and a moderate size right pneumothorax is stable. There is mild leftward shift of the mediastinum, unchanged. Bilateral rib fractures are difficult to delineate on this image. Mediastinal contour remains stable. The left lung is grossly clear. Negative visible bowel gas pattern. IMPRESSION: 1. Improved ET tube position, tip now about 10 mm above the carina. 2. Stable moderate size right pneumothorax, with a degree of tension given stable mild leftward shift of the mediastinum. 3. No areas of worsening ventilation. 4. Severe bilateral subcutaneous emphysema. Bilateral rib fractures difficult to delineate on the-there Electronically Signed: By: Genevie Ann M.D. On: 2016/07/25 10:05   Dg Chest Portable 1 View  Result Date: 2016-07-25 CLINICAL DATA:  Status post CPR and recent endotracheal tube placement EXAM: PORTABLE CHEST 1 VIEW COMPARISON:  05/31/2016 FINDINGS: Considerable subcutaneous emphysema is identified throughout the chest and extending into the neck. An endotracheal tube and nasogastric  catheter are noted. Nasogastric catheter is coiled within the stomach. The endotracheal tube is noted deep within the right mainstem bronchus and should be withdrawn at least 5-6 cm. A right-sided pneumothorax is noted particularly laterally with approximately 1 cm excursion throughout the right lung. Multiple bilateral rib fractures are noted. Some of these were present on the prior exam but have also increased likely related to the recent CPR. No definitive infiltrate is seen. Some linear increased density is noted overlying the right base although this is felt to be extrinsic to the patient related to the subcutaneous emphysema. IMPRESSION: Right-sided pneumothorax Multiple rib fractures Right  mainstem bronchus intubation Critical Value/emergent results were called by telephone at the time of interpretation on Jul 25, 2016 at 9:54 am to Dr. Theotis Burrow , who verbally acknowledged these results. Electronically Signed   By: Inez Catalina M.D.   On: 07-25-16 09:54   STUDIES:  CXR 3/21 with PTX and SQ air evident  CULTURES: None  ANTIBIOTICS: None  SIGNIFICANT EVENTS: 3/21 cardiac arrest  LINES/TUBES: ETT 3/21>>>  DISCUSSION: 76 year old female with previous brainstem CVA who was in SNF.  Patient originally was DNR but reversed code status in SNF.  Patient now after a cardiac arrest with prolonged resuscitation and now neurologic exam that is very poor (equivalent to brain dead) requiring very high dose epi (32 mcg) to maintain HR and BP.    Patient's husband and son arrived.  I spoke with them frankly about the patient's condition.  Informed them that down time was very long and that if I am to keep her alive then will need a chest tube, central line, high dose pressors.  They asked me directly if I think she will ever get back to normal.  I informed them that I can not say for sure no but given her prior health and prolonged down time that this is highly unlikely.    After discussion, they  informed me that she has been very unhappy in the SNF and that she would not want this level of care.  Based on that, decision was made to make patient a full DNR.  They are awaiting arrival of the daughter who is 30 minutes away and will proceed with comfort care.  Will leave patient in the ER and defer to EDP with PCCM signing off, please call back if needed.  The patient is critically ill with multiple organ systems failure and requires high complexity decision making for assessment and support, frequent evaluation and titration of therapies, application of advanced monitoring technologies and extensive interpretation of multiple databases.   Critical Care Time devoted to patient care services described in this note is  45  Minutes. This time reflects time of care of this signee Dr Jennet Maduro. This critical care time does not reflect procedure time, or teaching time or supervisory time of PA/NP/Med student/Med Resident etc but could involve care discussion time.  Rush Farmer, M.D. [facility] Pulmonary/Critical Care Medicine. Pager: [phone]. After hours pager: [phone].  07/25/16, 10:53 AM

## 2016-07-14 NOTE — ED Notes (Signed)
CCM at bedside 

## 2016-07-14 NOTE — ED Notes (Signed)
CCM advising not to start antibiotics at this time, speaking with family.

## 2016-07-14 NOTE — Code Documentation (Signed)
Patient time of death occurred at 11:44.

## 2016-07-14 NOTE — Progress Notes (Signed)
   2016/07/13 1100  Clinical Encounter Type  Visited With Patient and family together;Health care provider  Visit Type Critical Care;ED  Referral From Nurse  Consult/Referral To Chaplain  Spiritual Encounters  Spiritual Needs Prayer;Emotional;Grief support  Stress Factors  Patient Stress Factors None identified  Family Stress Factors Exhausted;Family relationships;Health changes;Loss    Chaplain responded to end of life page in [facility]. Provided emotional support, prayer, and ministry of presence. Family has experienced much loss in the last few months/years. Wonda Goodgame L. Volanda Napoleon, MDiv

## 2016-07-14 DEATH — deceased

## 2016-08-05 ENCOUNTER — Ambulatory Visit: Payer: Medicare Other | Admitting: Neurology

## 2016-08-19 ENCOUNTER — Ambulatory Visit: Payer: Self-pay | Admitting: Hematology

## 2016-08-19 ENCOUNTER — Other Ambulatory Visit: Payer: Self-pay

## 2016-11-25 ENCOUNTER — Other Ambulatory Visit: Payer: Self-pay

## 2016-12-02 ENCOUNTER — Ambulatory Visit: Payer: Self-pay | Admitting: Surgery

## 2017-10-08 IMAGING — CT CT HEAD W/O CM
3 of 4 series · 14 of 47 positions shown, 16 images · non-contrast
Comparison: 05/14/2016 MRI of the brain. 05/14/2016 CT of the head.

CLINICAL DATA: 75 y/o  F; altered mental status.

EXAM:
CT HEAD WITHOUT CONTRAST
TECHNIQUE: Contiguous axial images were obtained from the base of the skull
through the vertex without intravenous contrast.

[Series 2: head w/o · axial · non-contrast · 0.45mm/px · z∈[+1355,+1480]mm · 8 of 31 slices shown, 10 images]
[im 3/31  brain]
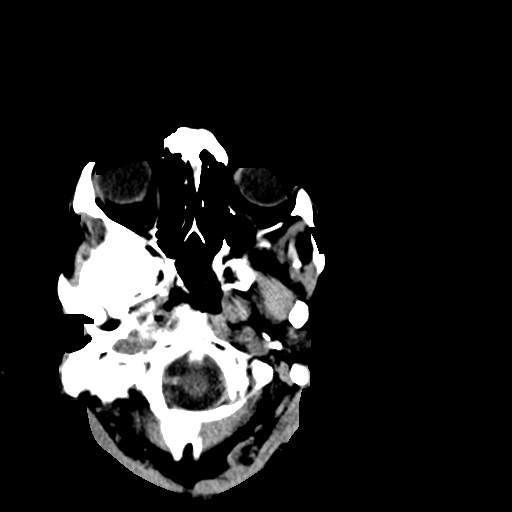
[im 3/31  bone]
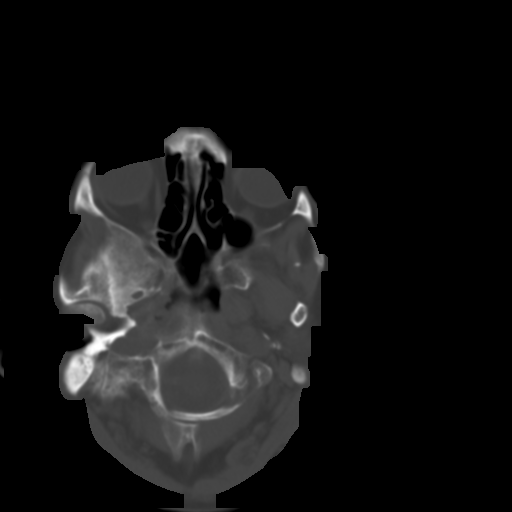
[im 7/31  brain]
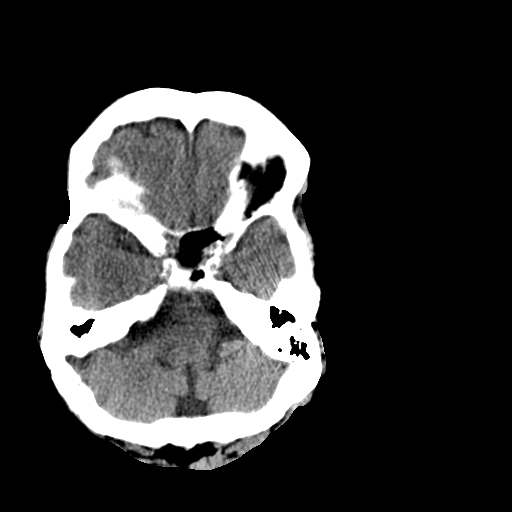
[im 11/31  brain]
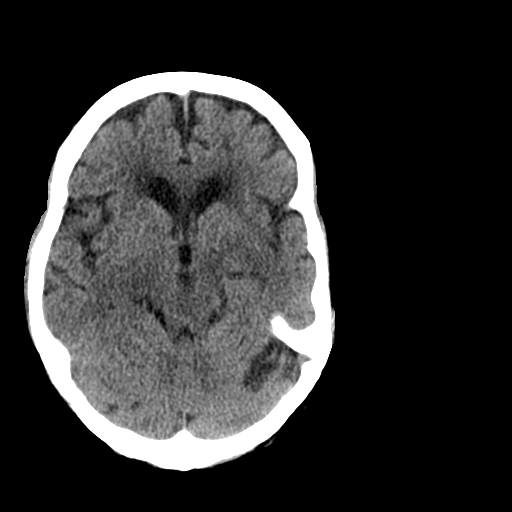
[im 13/31  brain]
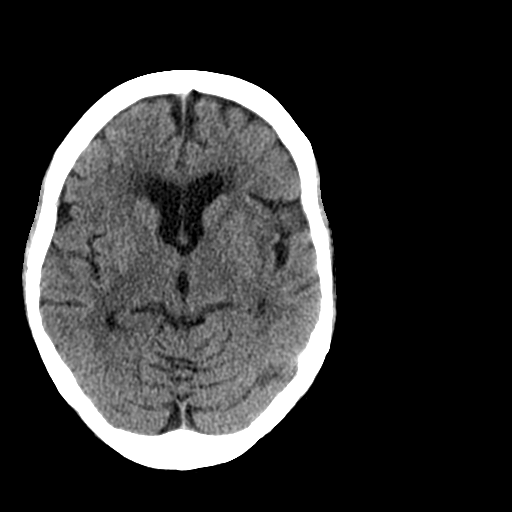
[im 18/31  brain]
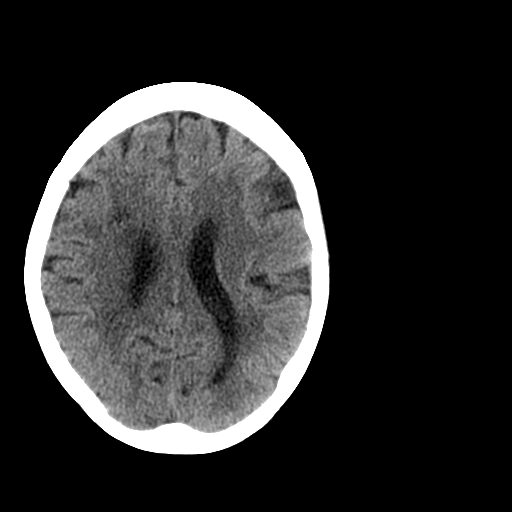
[im 18/31  bone]
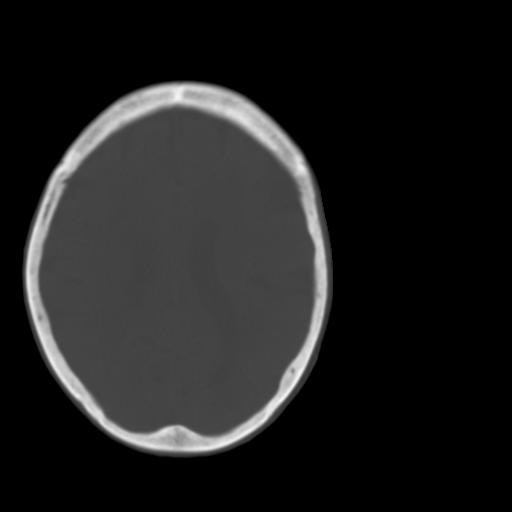
[im 20/31  brain]
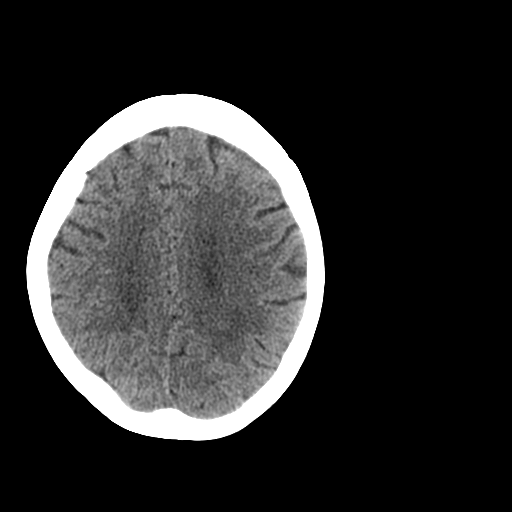
[im 24/31  brain]
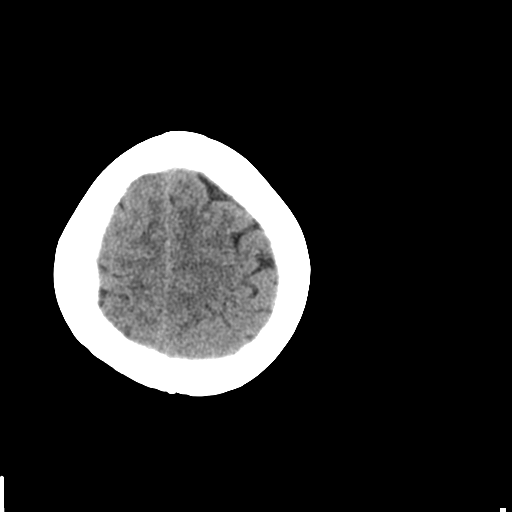
[im 28/31  brain]
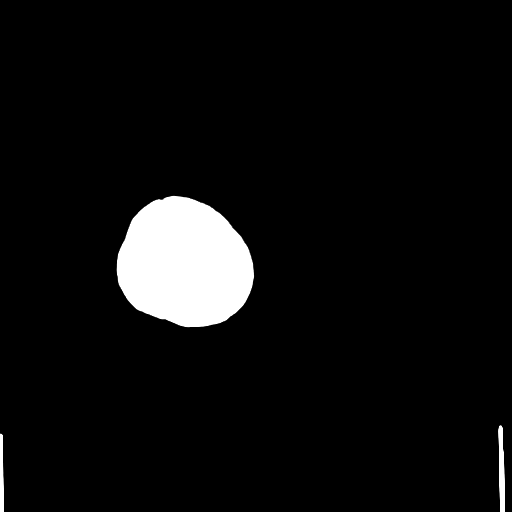

[Series 4: coronal · coronal · 0.29mm/px · 3 of 77 slices shown]
[im 26/77  brain]
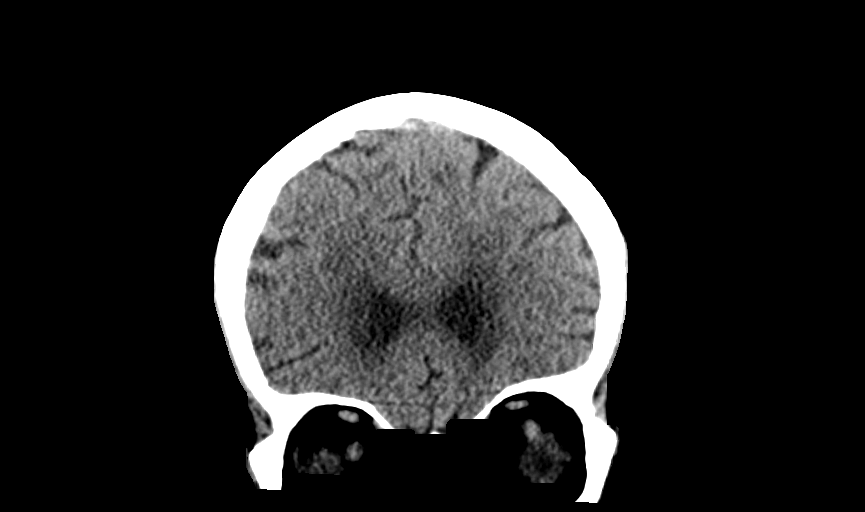
[im 34/77  brain]
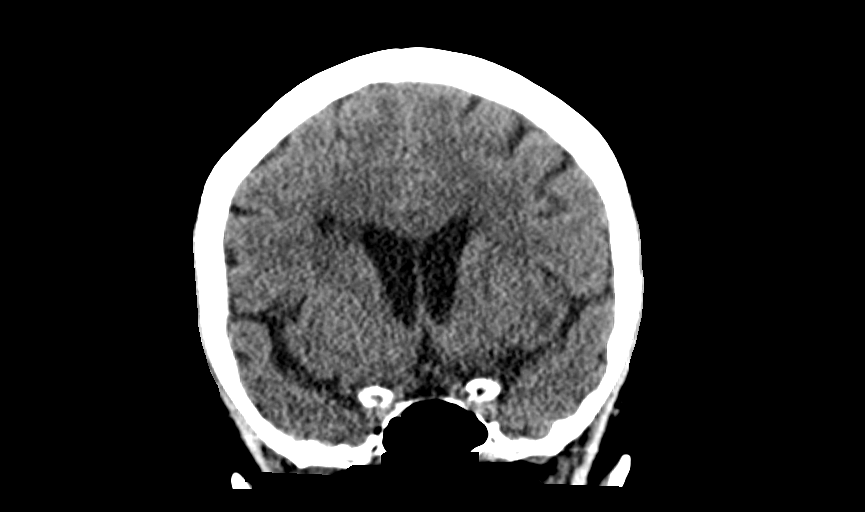
[im 43/77  brain]
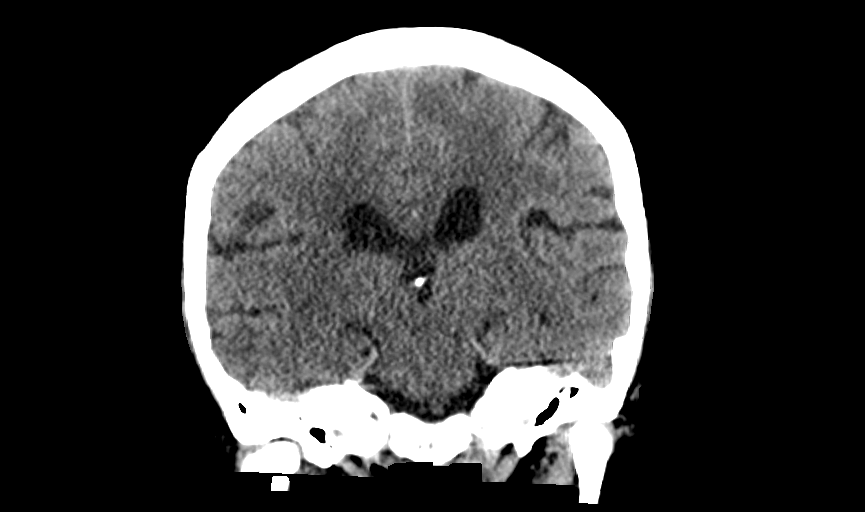

[Series 5: sagittal · sagittal · 0.29mm/px · 3 of 77 slices shown]
[im 26/77  brain]
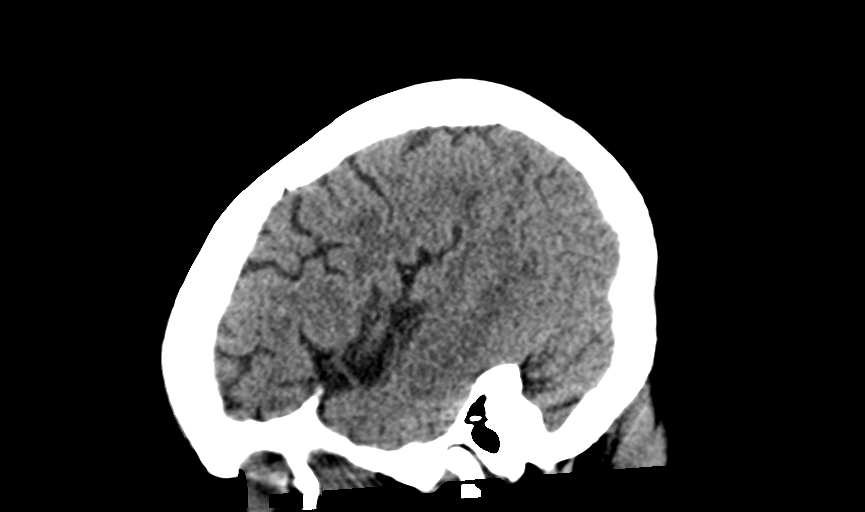
[im 39/77  brain]
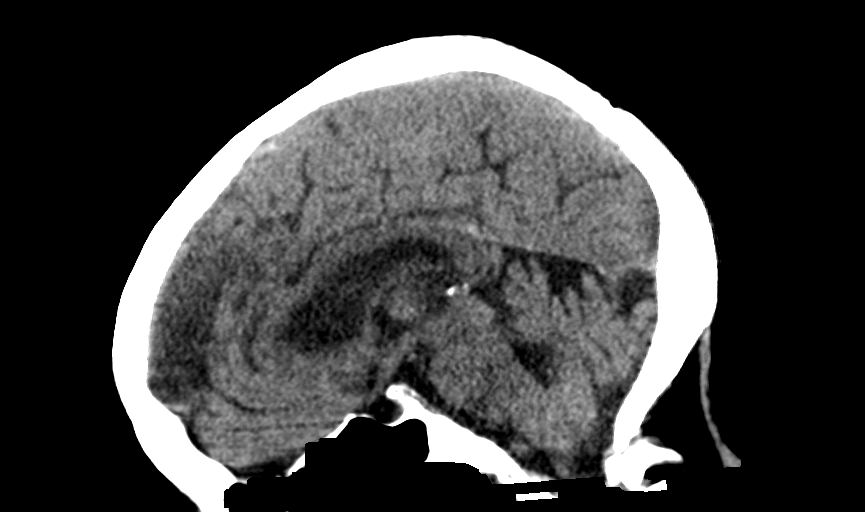
[im 51/77  brain]
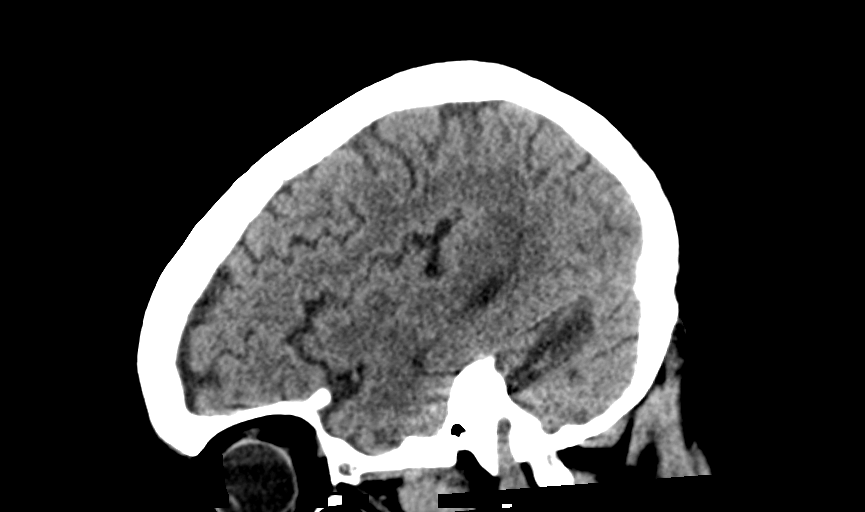

[14 of 47 positions shown; findings below may reference images not displayed]

FINDINGS: Brain: No evidence of acute infarction, hemorrhage, hydrocephalus,
extra-axial collection or mass lesion/mass effect. Stable small
chronic infarcts in right frontal white matter and the left
cerebellar hemisphere. Stable moderate chronic microvascular
ischemic changes and mild parenchymal volume loss of the brain.

Vascular: Calcific atherosclerosis of cavernous and paraclinoid
internal carotid arteries.

Skull: Normal. Negative for fracture or focal lesion.

Sinuses/Orbits: No acute finding.

Other: Bilateral intra-ocular lens replacement.
IMPRESSION: 1. No acute intracranial abnormality is identified.
2. Stable moderate chronic microvascular ischemic changes and mild
parenchymal volume loss of the brain. Stable chronic infarcts in
right frontal white matter and left cerebellar hemisphere.

By: Ma Del Consuelo Moguel M.D.
